# Patient Record
Sex: Female | Born: 1940 | Race: White | Hispanic: No | Smoking: Never smoker
Health system: Southern US, Community
[De-identification: ages and names within clinical notes are randomized; demographics above are authoritative.]

## PROBLEM LIST (undated history)

## (undated) ENCOUNTER — Emergency Department: Admission: EM | Payer: Medicare Other

## (undated) ENCOUNTER — Emergency Department: Admission: EM | Payer: Medicare Other | Source: Home / Self Care

## (undated) DIAGNOSIS — I509 Heart failure, unspecified: Secondary | ICD-10-CM

## (undated) DIAGNOSIS — E7800 Pure hypercholesterolemia, unspecified: Secondary | ICD-10-CM

## (undated) DIAGNOSIS — F32A Depression, unspecified: Secondary | ICD-10-CM

## (undated) DIAGNOSIS — E119 Type 2 diabetes mellitus without complications: Secondary | ICD-10-CM

## (undated) DIAGNOSIS — J439 Emphysema, unspecified: Secondary | ICD-10-CM

## (undated) DIAGNOSIS — I2699 Other pulmonary embolism without acute cor pulmonale: Secondary | ICD-10-CM

## (undated) DIAGNOSIS — I219 Acute myocardial infarction, unspecified: Secondary | ICD-10-CM

## (undated) DIAGNOSIS — C50919 Malignant neoplasm of unspecified site of unspecified female breast: Secondary | ICD-10-CM

## (undated) DIAGNOSIS — G4730 Sleep apnea, unspecified: Secondary | ICD-10-CM

## (undated) DIAGNOSIS — Z9221 Personal history of antineoplastic chemotherapy: Secondary | ICD-10-CM

## (undated) DIAGNOSIS — F419 Anxiety disorder, unspecified: Secondary | ICD-10-CM

## (undated) DIAGNOSIS — I10 Essential (primary) hypertension: Secondary | ICD-10-CM

## (undated) DIAGNOSIS — K219 Gastro-esophageal reflux disease without esophagitis: Secondary | ICD-10-CM

## (undated) DIAGNOSIS — E079 Disorder of thyroid, unspecified: Secondary | ICD-10-CM

## (undated) DIAGNOSIS — D649 Anemia, unspecified: Secondary | ICD-10-CM

## (undated) DIAGNOSIS — I4891 Unspecified atrial fibrillation: Secondary | ICD-10-CM

## (undated) DIAGNOSIS — D689 Coagulation defect, unspecified: Secondary | ICD-10-CM

## (undated) DIAGNOSIS — M1990 Unspecified osteoarthritis, unspecified site: Secondary | ICD-10-CM

## (undated) DIAGNOSIS — R609 Edema, unspecified: Secondary | ICD-10-CM

## (undated) DIAGNOSIS — H9190 Unspecified hearing loss, unspecified ear: Secondary | ICD-10-CM

## (undated) DIAGNOSIS — K259 Gastric ulcer, unspecified as acute or chronic, without hemorrhage or perforation: Secondary | ICD-10-CM

## (undated) HISTORY — DX: Coagulation defect, unspecified: D68.9

## (undated) HISTORY — DX: Pure hypercholesterolemia, unspecified: E78.00

## (undated) HISTORY — DX: Sleep apnea, unspecified: G47.30

## (undated) HISTORY — DX: Essential (primary) hypertension: I10

## (undated) HISTORY — DX: Gastro-esophageal reflux disease without esophagitis: K21.9

## (undated) HISTORY — DX: Anemia, unspecified: D64.9

## (undated) HISTORY — DX: Other pulmonary embolism without acute cor pulmonale: I26.99

## (undated) HISTORY — DX: Type 2 diabetes mellitus without complications: E11.9

## (undated) HISTORY — DX: Anxiety disorder, unspecified: F41.9

## (undated) HISTORY — DX: Gastric ulcer, unspecified as acute or chronic, without hemorrhage or perforation: K25.9

## (undated) HISTORY — DX: Disorder of thyroid, unspecified: E07.9

## (undated) HISTORY — DX: Heart failure, unspecified: I50.9

## (undated) HISTORY — DX: Acute myocardial infarction, unspecified: I21.9

## (undated) HISTORY — DX: Unspecified atrial fibrillation: I48.91

## (undated) HISTORY — DX: Malignant neoplasm of unspecified site of unspecified female breast: C50.919

## (undated) HISTORY — DX: Emphysema, unspecified: J43.9

## (undated) HISTORY — DX: Depression, unspecified: F32.A

---

## 1990-03-06 HISTORY — PX: BREAST LUMPECTOMY: SHX2

## 2004-01-20 ENCOUNTER — Ambulatory Visit: Payer: Self-pay

## 2004-01-25 ENCOUNTER — Ambulatory Visit: Payer: Self-pay

## 2004-08-29 ENCOUNTER — Ambulatory Visit: Payer: Self-pay | Admitting: Internal Medicine

## 2005-05-22 ENCOUNTER — Ambulatory Visit: Payer: Self-pay | Admitting: Internal Medicine

## 2005-06-04 ENCOUNTER — Ambulatory Visit: Payer: Self-pay | Admitting: Internal Medicine

## 2005-07-04 ENCOUNTER — Ambulatory Visit: Payer: Self-pay | Admitting: Internal Medicine

## 2005-08-31 ENCOUNTER — Ambulatory Visit: Payer: Self-pay | Admitting: Gastroenterology

## 2005-09-13 ENCOUNTER — Ambulatory Visit: Payer: Self-pay | Admitting: Internal Medicine

## 2006-07-12 ENCOUNTER — Emergency Department: Payer: Self-pay | Admitting: Unknown Physician Specialty

## 2006-09-24 ENCOUNTER — Ambulatory Visit: Payer: Self-pay | Admitting: Internal Medicine

## 2007-01-29 ENCOUNTER — Other Ambulatory Visit: Payer: Self-pay

## 2007-01-29 ENCOUNTER — Inpatient Hospital Stay: Payer: Self-pay | Admitting: Internal Medicine

## 2007-01-29 ENCOUNTER — Ambulatory Visit: Payer: Self-pay | Admitting: Internal Medicine

## 2007-02-04 ENCOUNTER — Ambulatory Visit: Payer: Self-pay | Admitting: Oncology

## 2007-02-12 ENCOUNTER — Ambulatory Visit: Payer: Self-pay | Admitting: Oncology

## 2007-03-07 ENCOUNTER — Ambulatory Visit: Payer: Self-pay | Admitting: Oncology

## 2007-04-07 ENCOUNTER — Ambulatory Visit: Payer: Self-pay | Admitting: Oncology

## 2007-04-22 ENCOUNTER — Ambulatory Visit: Payer: Self-pay | Admitting: Oncology

## 2007-05-05 ENCOUNTER — Ambulatory Visit: Payer: Self-pay | Admitting: Oncology

## 2007-06-05 ENCOUNTER — Ambulatory Visit: Payer: Self-pay | Admitting: Oncology

## 2007-06-07 ENCOUNTER — Inpatient Hospital Stay: Payer: Self-pay | Admitting: Internal Medicine

## 2007-06-17 ENCOUNTER — Ambulatory Visit: Payer: Self-pay | Admitting: Oncology

## 2007-07-05 ENCOUNTER — Ambulatory Visit: Payer: Self-pay | Admitting: Oncology

## 2007-08-05 ENCOUNTER — Ambulatory Visit: Payer: Self-pay | Admitting: Oncology

## 2007-08-05 ENCOUNTER — Ambulatory Visit: Payer: Self-pay | Admitting: Gastroenterology

## 2007-09-26 ENCOUNTER — Ambulatory Visit: Payer: Self-pay | Admitting: Internal Medicine

## 2008-02-11 ENCOUNTER — Ambulatory Visit: Payer: Self-pay | Admitting: Gastroenterology

## 2008-03-16 LAB — HM COLONOSCOPY

## 2008-04-29 ENCOUNTER — Ambulatory Visit: Payer: Self-pay | Admitting: Internal Medicine

## 2008-09-29 ENCOUNTER — Ambulatory Visit: Payer: Self-pay | Admitting: Internal Medicine

## 2008-11-04 ENCOUNTER — Ambulatory Visit: Payer: Self-pay | Admitting: Oncology

## 2008-11-16 ENCOUNTER — Ambulatory Visit: Payer: Self-pay | Admitting: Family Medicine

## 2008-11-23 ENCOUNTER — Ambulatory Visit: Payer: Self-pay | Admitting: Unknown Physician Specialty

## 2008-11-24 ENCOUNTER — Ambulatory Visit: Payer: Self-pay | Admitting: Unknown Physician Specialty

## 2008-11-25 ENCOUNTER — Ambulatory Visit: Payer: Self-pay | Admitting: Internal Medicine

## 2008-12-03 ENCOUNTER — Ambulatory Visit: Payer: Self-pay | Admitting: Oncology

## 2008-12-04 ENCOUNTER — Ambulatory Visit: Payer: Self-pay | Admitting: Oncology

## 2008-12-06 ENCOUNTER — Ambulatory Visit: Payer: Self-pay

## 2009-01-04 ENCOUNTER — Ambulatory Visit: Payer: Self-pay | Admitting: Oncology

## 2009-01-31 ENCOUNTER — Inpatient Hospital Stay: Payer: Self-pay | Admitting: Internal Medicine

## 2009-02-03 ENCOUNTER — Ambulatory Visit: Payer: Self-pay | Admitting: Oncology

## 2009-02-22 ENCOUNTER — Inpatient Hospital Stay: Payer: Self-pay | Admitting: Internal Medicine

## 2009-02-25 ENCOUNTER — Ambulatory Visit: Payer: Self-pay | Admitting: Oncology

## 2009-03-06 ENCOUNTER — Ambulatory Visit: Payer: Self-pay | Admitting: Oncology

## 2009-05-04 ENCOUNTER — Ambulatory Visit: Payer: Self-pay | Admitting: Oncology

## 2009-06-01 ENCOUNTER — Ambulatory Visit: Payer: Self-pay | Admitting: Oncology

## 2009-06-04 ENCOUNTER — Ambulatory Visit: Payer: Self-pay | Admitting: Oncology

## 2009-08-03 ENCOUNTER — Ambulatory Visit: Payer: Self-pay | Admitting: Internal Medicine

## 2009-10-08 ENCOUNTER — Ambulatory Visit: Payer: Self-pay | Admitting: Internal Medicine

## 2009-11-19 ENCOUNTER — Ambulatory Visit: Payer: Self-pay | Admitting: Vascular Surgery

## 2010-01-12 ENCOUNTER — Ambulatory Visit: Payer: Self-pay | Admitting: Internal Medicine

## 2010-05-18 ENCOUNTER — Ambulatory Visit: Payer: Self-pay | Admitting: Dermatology

## 2010-06-19 ENCOUNTER — Ambulatory Visit: Payer: Self-pay | Admitting: Family Medicine

## 2010-08-24 ENCOUNTER — Ambulatory Visit: Payer: Self-pay | Admitting: Family Medicine

## 2010-10-11 ENCOUNTER — Ambulatory Visit: Payer: Self-pay | Admitting: Internal Medicine

## 2010-10-12 LAB — HM DIABETES EYE EXAM

## 2011-03-29 DIAGNOSIS — E119 Type 2 diabetes mellitus without complications: Secondary | ICD-10-CM | POA: Diagnosis not present

## 2011-03-29 DIAGNOSIS — I10 Essential (primary) hypertension: Secondary | ICD-10-CM | POA: Diagnosis not present

## 2011-03-29 DIAGNOSIS — K219 Gastro-esophageal reflux disease without esophagitis: Secondary | ICD-10-CM | POA: Diagnosis not present

## 2011-04-05 DIAGNOSIS — R5381 Other malaise: Secondary | ICD-10-CM | POA: Diagnosis not present

## 2011-04-05 DIAGNOSIS — I10 Essential (primary) hypertension: Secondary | ICD-10-CM | POA: Diagnosis not present

## 2011-04-05 DIAGNOSIS — R5383 Other fatigue: Secondary | ICD-10-CM | POA: Diagnosis not present

## 2011-04-05 DIAGNOSIS — E7800 Pure hypercholesterolemia, unspecified: Secondary | ICD-10-CM | POA: Diagnosis not present

## 2011-04-05 DIAGNOSIS — E119 Type 2 diabetes mellitus without complications: Secondary | ICD-10-CM | POA: Diagnosis not present

## 2011-04-05 DIAGNOSIS — I2699 Other pulmonary embolism without acute cor pulmonale: Secondary | ICD-10-CM | POA: Diagnosis not present

## 2011-04-10 DIAGNOSIS — Z1211 Encounter for screening for malignant neoplasm of colon: Secondary | ICD-10-CM | POA: Diagnosis not present

## 2011-05-03 DIAGNOSIS — I2699 Other pulmonary embolism without acute cor pulmonale: Secondary | ICD-10-CM | POA: Diagnosis not present

## 2011-05-09 DIAGNOSIS — I87099 Postthrombotic syndrome with other complications of unspecified lower extremity: Secondary | ICD-10-CM | POA: Diagnosis not present

## 2011-05-09 DIAGNOSIS — M79609 Pain in unspecified limb: Secondary | ICD-10-CM | POA: Diagnosis not present

## 2011-05-17 DIAGNOSIS — B351 Tinea unguium: Secondary | ICD-10-CM | POA: Diagnosis not present

## 2011-05-17 DIAGNOSIS — E119 Type 2 diabetes mellitus without complications: Secondary | ICD-10-CM | POA: Diagnosis not present

## 2011-05-31 DIAGNOSIS — I2699 Other pulmonary embolism without acute cor pulmonale: Secondary | ICD-10-CM | POA: Diagnosis not present

## 2011-06-27 DIAGNOSIS — I2699 Other pulmonary embolism without acute cor pulmonale: Secondary | ICD-10-CM | POA: Diagnosis not present

## 2011-07-24 DIAGNOSIS — D649 Anemia, unspecified: Secondary | ICD-10-CM | POA: Diagnosis not present

## 2011-07-24 DIAGNOSIS — I2699 Other pulmonary embolism without acute cor pulmonale: Secondary | ICD-10-CM | POA: Diagnosis not present

## 2011-07-24 DIAGNOSIS — E7800 Pure hypercholesterolemia, unspecified: Secondary | ICD-10-CM | POA: Diagnosis not present

## 2011-07-24 DIAGNOSIS — I10 Essential (primary) hypertension: Secondary | ICD-10-CM | POA: Diagnosis not present

## 2011-07-24 DIAGNOSIS — E119 Type 2 diabetes mellitus without complications: Secondary | ICD-10-CM | POA: Diagnosis not present

## 2011-07-24 DIAGNOSIS — Z79899 Other long term (current) drug therapy: Secondary | ICD-10-CM | POA: Diagnosis not present

## 2011-07-26 DIAGNOSIS — E119 Type 2 diabetes mellitus without complications: Secondary | ICD-10-CM | POA: Diagnosis not present

## 2011-07-26 DIAGNOSIS — E7800 Pure hypercholesterolemia, unspecified: Secondary | ICD-10-CM | POA: Diagnosis not present

## 2011-07-26 DIAGNOSIS — I10 Essential (primary) hypertension: Secondary | ICD-10-CM | POA: Diagnosis not present

## 2011-08-23 DIAGNOSIS — I2699 Other pulmonary embolism without acute cor pulmonale: Secondary | ICD-10-CM | POA: Diagnosis not present

## 2011-08-23 DIAGNOSIS — B351 Tinea unguium: Secondary | ICD-10-CM | POA: Diagnosis not present

## 2011-08-23 DIAGNOSIS — E119 Type 2 diabetes mellitus without complications: Secondary | ICD-10-CM | POA: Diagnosis not present

## 2011-09-13 DIAGNOSIS — I2699 Other pulmonary embolism without acute cor pulmonale: Secondary | ICD-10-CM | POA: Diagnosis not present

## 2011-10-11 DIAGNOSIS — I2699 Other pulmonary embolism without acute cor pulmonale: Secondary | ICD-10-CM | POA: Diagnosis not present

## 2011-10-18 ENCOUNTER — Ambulatory Visit: Payer: Self-pay | Admitting: Internal Medicine

## 2011-10-18 DIAGNOSIS — Z853 Personal history of malignant neoplasm of breast: Secondary | ICD-10-CM | POA: Diagnosis not present

## 2011-10-18 DIAGNOSIS — N6489 Other specified disorders of breast: Secondary | ICD-10-CM | POA: Diagnosis not present

## 2011-10-25 DIAGNOSIS — I2699 Other pulmonary embolism without acute cor pulmonale: Secondary | ICD-10-CM | POA: Diagnosis not present

## 2011-11-02 DIAGNOSIS — I2699 Other pulmonary embolism without acute cor pulmonale: Secondary | ICD-10-CM | POA: Diagnosis not present

## 2011-11-08 DIAGNOSIS — I2699 Other pulmonary embolism without acute cor pulmonale: Secondary | ICD-10-CM | POA: Diagnosis not present

## 2011-11-15 DIAGNOSIS — B351 Tinea unguium: Secondary | ICD-10-CM | POA: Diagnosis not present

## 2011-11-15 DIAGNOSIS — E119 Type 2 diabetes mellitus without complications: Secondary | ICD-10-CM | POA: Diagnosis not present

## 2011-11-15 DIAGNOSIS — L851 Acquired keratosis [keratoderma] palmaris et plantaris: Secondary | ICD-10-CM | POA: Diagnosis not present

## 2011-11-15 DIAGNOSIS — I2699 Other pulmonary embolism without acute cor pulmonale: Secondary | ICD-10-CM | POA: Diagnosis not present

## 2011-11-21 DIAGNOSIS — I2699 Other pulmonary embolism without acute cor pulmonale: Secondary | ICD-10-CM | POA: Diagnosis not present

## 2011-11-29 DIAGNOSIS — I2699 Other pulmonary embolism without acute cor pulmonale: Secondary | ICD-10-CM | POA: Diagnosis not present

## 2011-12-08 ENCOUNTER — Other Ambulatory Visit (INDEPENDENT_AMBULATORY_CARE_PROVIDER_SITE_OTHER): Payer: Medicare Other

## 2011-12-08 DIAGNOSIS — Z79899 Other long term (current) drug therapy: Secondary | ICD-10-CM | POA: Diagnosis not present

## 2011-12-08 DIAGNOSIS — I2699 Other pulmonary embolism without acute cor pulmonale: Secondary | ICD-10-CM

## 2011-12-08 LAB — PROTIME-INR
INR: 2.6 ratio — ABNORMAL HIGH (ref 0.8–1.0)
Prothrombin Time: 27 s — ABNORMAL HIGH (ref 10.2–12.4)

## 2011-12-12 DIAGNOSIS — Z23 Encounter for immunization: Secondary | ICD-10-CM | POA: Diagnosis not present

## 2011-12-19 ENCOUNTER — Other Ambulatory Visit: Payer: Self-pay

## 2011-12-19 DIAGNOSIS — Z79899 Other long term (current) drug therapy: Secondary | ICD-10-CM

## 2011-12-19 DIAGNOSIS — I2699 Other pulmonary embolism without acute cor pulmonale: Secondary | ICD-10-CM

## 2011-12-21 ENCOUNTER — Other Ambulatory Visit (INDEPENDENT_AMBULATORY_CARE_PROVIDER_SITE_OTHER): Payer: Medicare Other

## 2011-12-21 DIAGNOSIS — Z79899 Other long term (current) drug therapy: Secondary | ICD-10-CM

## 2011-12-21 DIAGNOSIS — I2699 Other pulmonary embolism without acute cor pulmonale: Secondary | ICD-10-CM | POA: Diagnosis not present

## 2011-12-21 LAB — PROTIME-INR: Prothrombin Time: 28.8 s — ABNORMAL HIGH (ref 10.2–12.4)

## 2011-12-22 ENCOUNTER — Other Ambulatory Visit: Payer: Self-pay | Admitting: Internal Medicine

## 2011-12-22 DIAGNOSIS — I4891 Unspecified atrial fibrillation: Secondary | ICD-10-CM

## 2011-12-26 DIAGNOSIS — K219 Gastro-esophageal reflux disease without esophagitis: Secondary | ICD-10-CM | POA: Diagnosis not present

## 2012-01-01 ENCOUNTER — Ambulatory Visit (INDEPENDENT_AMBULATORY_CARE_PROVIDER_SITE_OTHER): Payer: Medicare Other | Admitting: Internal Medicine

## 2012-01-01 ENCOUNTER — Encounter: Payer: Self-pay | Admitting: Internal Medicine

## 2012-01-01 VITALS — BP 125/77 | HR 95 | Temp 97.5°F | Ht 62.0 in | Wt 240.5 lb

## 2012-01-01 DIAGNOSIS — Z7901 Long term (current) use of anticoagulants: Secondary | ICD-10-CM | POA: Diagnosis not present

## 2012-01-01 DIAGNOSIS — M549 Dorsalgia, unspecified: Secondary | ICD-10-CM | POA: Diagnosis not present

## 2012-01-01 DIAGNOSIS — I10 Essential (primary) hypertension: Secondary | ICD-10-CM

## 2012-01-01 DIAGNOSIS — C50919 Malignant neoplasm of unspecified site of unspecified female breast: Secondary | ICD-10-CM

## 2012-01-01 DIAGNOSIS — Z853 Personal history of malignant neoplasm of breast: Secondary | ICD-10-CM | POA: Insufficient documentation

## 2012-01-01 DIAGNOSIS — E119 Type 2 diabetes mellitus without complications: Secondary | ICD-10-CM | POA: Insufficient documentation

## 2012-01-01 DIAGNOSIS — I2699 Other pulmonary embolism without acute cor pulmonale: Secondary | ICD-10-CM | POA: Diagnosis not present

## 2012-01-01 DIAGNOSIS — E7800 Pure hypercholesterolemia, unspecified: Secondary | ICD-10-CM

## 2012-01-01 DIAGNOSIS — E1149 Type 2 diabetes mellitus with other diabetic neurological complication: Secondary | ICD-10-CM | POA: Insufficient documentation

## 2012-01-01 DIAGNOSIS — M25559 Pain in unspecified hip: Secondary | ICD-10-CM

## 2012-01-01 NOTE — Assessment & Plan Note (Signed)
S/P left breast mastectomy.  Schedule mammogram when due.

## 2012-01-01 NOTE — Assessment & Plan Note (Signed)
Low cholesterol diet and exercise.  Continue same meds. Check lipid panel and liver function.

## 2012-01-01 NOTE — Assessment & Plan Note (Signed)
Blood pressure under good control.  Same meds.  Check met b.

## 2012-01-01 NOTE — Progress Notes (Signed)
  Subjective:    [name], female    DOB: 29-Jun-1940, 71 y.o.   MRN: [id number]  HPI 71 year old female with past history of hypertension, diabetes, hypercholesterolemia and breast cancer s/p left breast mastectomy who comes in today for a scheduled follow up.  She states she has been doing well.  Her main complaint is that of left hip and buttock pain.  No pain radiating down the leg.  No numbness or tingling.  Pain present for the last month.  No known injury or trauma.    Past Medical History  Diagnosis Date  . Hypertension   . Diabetes mellitus   . Breast cancer     s/p left breast mastectomy  . Hypercholesterolemia     Review of Systems Patient denies any headache, lightheadedness or dizziness.  No sinus or allergy problems.  No chest pain, tightness or palpitations.  No increased shortness of breath, cough or congestion.  No nausea or vomiting.  No abdominal pain or cramping.  No bowel change, such as diarrhea, constipation, BRBPR or melana.  No urine change.  Got her flu shot.        Objective:   Physical Exam Filed Vitals:   01/01/12 1430  BP: 125/77  Pulse: 95  Temp: 97.5 F (70.9 C)   71 year old female in no acute distress.   HEENT:  Nares - clear.  OP- without lesions or erythema.  NECK:  Supple, nontender.  No audible carotid bruit.   HEART:  Appears to be regular. LUNGS:  Without crackles or wheezing audible.  Respirations even and unlabored.   RADIAL PULSE:  Equal bilaterally.  ABDOMEN:  Soft, nontender.  No audible abdominal bruit.   EXTREMITIES:  No increased edema to be present.              MSK:  No pain wit straight leg raise.  No pain to palpation over the left hip.  No pain with rotation of the hip.        Assessment & Plan:  MSK.  Left hip and buttock pain.  Exam as outlined.  Taking Tylenol.  Helps.  Check L-S spine xray and left hip xray.    HEALTH MAINTENANCE.  Schedule for a physical at next visit.  Discuss further health maintenance  issues with her then.

## 2012-01-01 NOTE — Assessment & Plan Note (Signed)
Sugars have been well controlled.  On no meds.  Follow.

## 2012-01-01 NOTE — Patient Instructions (Signed)
It was good seeing you today.  We will let you know about your coumadin blood results - once they are available.  We will get your physical scheduled.

## 2012-01-02 ENCOUNTER — Ambulatory Visit (INDEPENDENT_AMBULATORY_CARE_PROVIDER_SITE_OTHER)
Admission: RE | Admit: 2012-01-02 | Discharge: 2012-01-02 | Disposition: A | Discharge status: Home / Self Care | Payer: Medicare Other | Source: Ambulatory Visit | Attending: Internal Medicine | Admitting: Internal Medicine

## 2012-01-02 ENCOUNTER — Encounter: Payer: Self-pay | Admitting: Internal Medicine

## 2012-01-02 ENCOUNTER — Other Ambulatory Visit: Payer: Self-pay | Admitting: Internal Medicine

## 2012-01-02 DIAGNOSIS — M5137 Other intervertebral disc degeneration, lumbosacral region: Secondary | ICD-10-CM | POA: Diagnosis not present

## 2012-01-02 DIAGNOSIS — M1610 Unilateral primary osteoarthritis, unspecified hip: Secondary | ICD-10-CM | POA: Diagnosis not present

## 2012-01-02 DIAGNOSIS — M25559 Pain in unspecified hip: Secondary | ICD-10-CM

## 2012-01-02 DIAGNOSIS — M549 Dorsalgia, unspecified: Secondary | ICD-10-CM | POA: Diagnosis not present

## 2012-01-02 DIAGNOSIS — I2699 Other pulmonary embolism without acute cor pulmonale: Secondary | ICD-10-CM

## 2012-01-02 DIAGNOSIS — M169 Osteoarthritis of hip, unspecified: Secondary | ICD-10-CM | POA: Diagnosis not present

## 2012-01-02 LAB — PROTIME-INR: Prothrombin Time: 32.8 s — ABNORMAL HIGH (ref 10.2–12.4)

## 2012-01-16 ENCOUNTER — Other Ambulatory Visit (INDEPENDENT_AMBULATORY_CARE_PROVIDER_SITE_OTHER): Payer: Medicare Other

## 2012-01-16 DIAGNOSIS — I4891 Unspecified atrial fibrillation: Secondary | ICD-10-CM | POA: Diagnosis not present

## 2012-01-16 DIAGNOSIS — M25559 Pain in unspecified hip: Secondary | ICD-10-CM | POA: Diagnosis not present

## 2012-01-16 DIAGNOSIS — M549 Dorsalgia, unspecified: Secondary | ICD-10-CM | POA: Diagnosis not present

## 2012-01-17 ENCOUNTER — Other Ambulatory Visit: Payer: Self-pay | Admitting: Internal Medicine

## 2012-01-17 DIAGNOSIS — I2699 Other pulmonary embolism without acute cor pulmonale: Secondary | ICD-10-CM

## 2012-01-17 LAB — PROTIME-INR: Prothrombin Time: 20.1 s — ABNORMAL HIGH (ref 10.2–12.4)

## 2012-01-18 DIAGNOSIS — M25559 Pain in unspecified hip: Secondary | ICD-10-CM | POA: Diagnosis not present

## 2012-01-18 DIAGNOSIS — M549 Dorsalgia, unspecified: Secondary | ICD-10-CM | POA: Diagnosis not present

## 2012-01-19 NOTE — Progress Notes (Signed)
Patient notified

## 2012-01-19 NOTE — Progress Notes (Signed)
Called and gave lab results to patient. Will call back appt time, ok to leave message on machine. 11/22 10:30. Patient notified.

## 2012-01-23 DIAGNOSIS — M549 Dorsalgia, unspecified: Secondary | ICD-10-CM | POA: Diagnosis not present

## 2012-01-23 DIAGNOSIS — M25559 Pain in unspecified hip: Secondary | ICD-10-CM | POA: Diagnosis not present

## 2012-01-25 DIAGNOSIS — M549 Dorsalgia, unspecified: Secondary | ICD-10-CM | POA: Diagnosis not present

## 2012-01-25 DIAGNOSIS — M25559 Pain in unspecified hip: Secondary | ICD-10-CM | POA: Diagnosis not present

## 2012-01-26 ENCOUNTER — Other Ambulatory Visit (INDEPENDENT_AMBULATORY_CARE_PROVIDER_SITE_OTHER): Payer: Medicare Other

## 2012-01-26 DIAGNOSIS — I2699 Other pulmonary embolism without acute cor pulmonale: Secondary | ICD-10-CM | POA: Diagnosis not present

## 2012-01-26 LAB — PROTIME-INR: INR: 2.3 ratio — ABNORMAL HIGH (ref 0.8–1.0)

## 2012-01-27 ENCOUNTER — Other Ambulatory Visit: Payer: Self-pay | Admitting: Internal Medicine

## 2012-01-27 DIAGNOSIS — I2699 Other pulmonary embolism without acute cor pulmonale: Secondary | ICD-10-CM

## 2012-01-30 DIAGNOSIS — M25559 Pain in unspecified hip: Secondary | ICD-10-CM | POA: Diagnosis not present

## 2012-01-30 DIAGNOSIS — M549 Dorsalgia, unspecified: Secondary | ICD-10-CM | POA: Diagnosis not present

## 2012-02-02 DIAGNOSIS — J069 Acute upper respiratory infection, unspecified: Secondary | ICD-10-CM | POA: Diagnosis not present

## 2012-02-04 DIAGNOSIS — M549 Dorsalgia, unspecified: Secondary | ICD-10-CM | POA: Diagnosis not present

## 2012-02-04 DIAGNOSIS — M25559 Pain in unspecified hip: Secondary | ICD-10-CM | POA: Diagnosis not present

## 2012-02-07 DIAGNOSIS — B351 Tinea unguium: Secondary | ICD-10-CM | POA: Diagnosis not present

## 2012-02-07 DIAGNOSIS — E119 Type 2 diabetes mellitus without complications: Secondary | ICD-10-CM | POA: Diagnosis not present

## 2012-02-21 ENCOUNTER — Other Ambulatory Visit (INDEPENDENT_AMBULATORY_CARE_PROVIDER_SITE_OTHER): Payer: Medicare Other

## 2012-02-21 ENCOUNTER — Other Ambulatory Visit: Payer: Self-pay | Admitting: Internal Medicine

## 2012-02-21 DIAGNOSIS — I2699 Other pulmonary embolism without acute cor pulmonale: Secondary | ICD-10-CM

## 2012-02-21 NOTE — Telephone Encounter (Signed)
Warfarin Sodium 2.5 mg tab  Take 2 tablets on Tues, Thurs, Sat, and Sun and take 1 tablet on all other days  # 100

## 2012-02-21 NOTE — Telephone Encounter (Signed)
Pt is needing refill on Warfarin she uses [facility] 3 month supply.

## 2012-02-22 ENCOUNTER — Other Ambulatory Visit: Payer: Self-pay | Admitting: Internal Medicine

## 2012-02-22 DIAGNOSIS — I2699 Other pulmonary embolism without acute cor pulmonale: Secondary | ICD-10-CM

## 2012-02-22 DIAGNOSIS — D649 Anemia, unspecified: Secondary | ICD-10-CM

## 2012-02-22 DIAGNOSIS — R5383 Other fatigue: Secondary | ICD-10-CM

## 2012-02-22 DIAGNOSIS — E119 Type 2 diabetes mellitus without complications: Secondary | ICD-10-CM

## 2012-02-22 DIAGNOSIS — I10 Essential (primary) hypertension: Secondary | ICD-10-CM

## 2012-02-22 DIAGNOSIS — E7800 Pure hypercholesterolemia, unspecified: Secondary | ICD-10-CM

## 2012-02-22 NOTE — Telephone Encounter (Addendum)
Lisinopril 20 mg tab  Take one tablet by mouth every day  #90  Ferrous Sulf 5 GR tab  Take one tablet by mouth every day  #90  Simvastatin 10 mg tab  Take one tablet by mouth every day  #90

## 2012-02-22 NOTE — Progress Notes (Signed)
Orders placed for follow up labs 

## 2012-02-23 ENCOUNTER — Telehealth: Payer: Self-pay | Admitting: Internal Medicine

## 2012-02-23 NOTE — Telephone Encounter (Signed)
Medications called in to pharmacy. Patient was notified.

## 2012-02-23 NOTE — Telephone Encounter (Signed)
Refills for 90 days with 3 refills called in for coumadin, simvastatin, lisinopril, and iron

## 2012-02-23 NOTE — Telephone Encounter (Signed)
Patient would like these refilled Please advise

## 2012-02-23 NOTE — Telephone Encounter (Signed)
Pt called and all she would say is if we could get a nurse or Dr .Lorin Picket to call her back.

## 2012-02-23 NOTE — Telephone Encounter (Signed)
See other message.  rx called in for simvastatin, iron, lisinopril and coumadin (90 days with 3 refills)

## 2012-02-29 MED ORDER — WARFARIN SODIUM 2.5 MG PO TABS: ORAL_TABLET | ORAL | Status: DC

## 2012-02-29 NOTE — Telephone Encounter (Signed)
Sent in to pharmacy.  

## 2012-03-06 DIAGNOSIS — M25559 Pain in unspecified hip: Secondary | ICD-10-CM | POA: Diagnosis not present

## 2012-03-06 DIAGNOSIS — M549 Dorsalgia, unspecified: Secondary | ICD-10-CM | POA: Diagnosis not present

## 2012-03-07 DIAGNOSIS — M549 Dorsalgia, unspecified: Secondary | ICD-10-CM | POA: Diagnosis not present

## 2012-03-07 DIAGNOSIS — M25559 Pain in unspecified hip: Secondary | ICD-10-CM | POA: Diagnosis not present

## 2012-03-08 DIAGNOSIS — M549 Dorsalgia, unspecified: Secondary | ICD-10-CM | POA: Diagnosis not present

## 2012-03-08 DIAGNOSIS — M25559 Pain in unspecified hip: Secondary | ICD-10-CM | POA: Diagnosis not present

## 2012-03-12 DIAGNOSIS — M549 Dorsalgia, unspecified: Secondary | ICD-10-CM | POA: Diagnosis not present

## 2012-03-12 DIAGNOSIS — M25559 Pain in unspecified hip: Secondary | ICD-10-CM | POA: Diagnosis not present

## 2012-03-14 DIAGNOSIS — M549 Dorsalgia, unspecified: Secondary | ICD-10-CM | POA: Diagnosis not present

## 2012-03-14 DIAGNOSIS — M25559 Pain in unspecified hip: Secondary | ICD-10-CM | POA: Diagnosis not present

## 2012-03-18 DIAGNOSIS — M25559 Pain in unspecified hip: Secondary | ICD-10-CM | POA: Diagnosis not present

## 2012-03-18 DIAGNOSIS — M549 Dorsalgia, unspecified: Secondary | ICD-10-CM | POA: Diagnosis not present

## 2012-03-20 ENCOUNTER — Other Ambulatory Visit (INDEPENDENT_AMBULATORY_CARE_PROVIDER_SITE_OTHER): Payer: Medicare Other

## 2012-03-20 ENCOUNTER — Other Ambulatory Visit: Payer: Self-pay | Admitting: Internal Medicine

## 2012-03-20 DIAGNOSIS — D649 Anemia, unspecified: Secondary | ICD-10-CM | POA: Diagnosis not present

## 2012-03-20 DIAGNOSIS — I2699 Other pulmonary embolism without acute cor pulmonale: Secondary | ICD-10-CM

## 2012-03-20 DIAGNOSIS — R5381 Other malaise: Secondary | ICD-10-CM | POA: Diagnosis not present

## 2012-03-20 DIAGNOSIS — E7800 Pure hypercholesterolemia, unspecified: Secondary | ICD-10-CM | POA: Diagnosis not present

## 2012-03-20 DIAGNOSIS — R5383 Other fatigue: Secondary | ICD-10-CM

## 2012-03-20 DIAGNOSIS — E119 Type 2 diabetes mellitus without complications: Secondary | ICD-10-CM

## 2012-03-20 DIAGNOSIS — Z7901 Long term (current) use of anticoagulants: Secondary | ICD-10-CM | POA: Diagnosis not present

## 2012-03-20 DIAGNOSIS — I10 Essential (primary) hypertension: Secondary | ICD-10-CM | POA: Diagnosis not present

## 2012-03-20 LAB — CBC WITH DIFFERENTIAL/PLATELET
Eosinophils Absolute: 0.1 10*3/uL (ref 0.0–0.7)
MCHC: 33.5 g/dL (ref 30.0–36.0)
MCV: 90.4 fl (ref 78.0–100.0)
Monocytes Absolute: 0.8 10*3/uL (ref 0.1–1.0)
Neutrophils Relative %: 51.1 % (ref 43.0–77.0)
Platelets: 194 10*3/uL (ref 150.0–400.0)

## 2012-03-20 LAB — BASIC METABOLIC PANEL
Calcium: 9.4 mg/dL (ref 8.4–10.5)
GFR: 52.71 mL/min — ABNORMAL LOW (ref >60.00)
Glucose, Bld: 68 mg/dL — ABNORMAL LOW (ref 70–99)
Sodium: 138 mEq/L (ref 135–145)

## 2012-03-20 LAB — HEPATIC FUNCTION PANEL
AST: 24 U/L (ref 0–37)
Albumin: 4 g/dL (ref 3.5–5.2)
Alkaline Phosphatase: 42 U/L (ref 39–117)
Bilirubin, Direct: 0 mg/dL (ref 0.0–0.3)

## 2012-03-20 LAB — LIPID PANEL: HDL: 51.6 mg/dL (ref >39.00)

## 2012-03-20 LAB — FERRITIN: Ferritin: 150.2 ng/mL (ref 10.0–291.0)

## 2012-03-20 LAB — TSH: TSH: 2.36 u[IU]/mL (ref 0.35–5.50)

## 2012-03-20 NOTE — Progress Notes (Signed)
Order placed for follow up pt/inr.  

## 2012-04-05 ENCOUNTER — Ambulatory Visit (INDEPENDENT_AMBULATORY_CARE_PROVIDER_SITE_OTHER): Payer: Medicare Other | Admitting: Internal Medicine

## 2012-04-05 ENCOUNTER — Other Ambulatory Visit (HOSPITAL_COMMUNITY)
Admission: RE | Admit: 2012-04-05 | Discharge: 2012-04-05 | Disposition: A | Discharge status: Home / Self Care | Payer: Medicare Other | Source: Ambulatory Visit | Attending: Internal Medicine | Admitting: Internal Medicine

## 2012-04-05 ENCOUNTER — Encounter: Payer: Self-pay | Admitting: Internal Medicine

## 2012-04-05 VITALS — BP 120/70 | HR 85 | Temp 97.8°F | Ht 62.0 in | Wt 242.5 lb

## 2012-04-05 DIAGNOSIS — C50919 Malignant neoplasm of unspecified site of unspecified female breast: Secondary | ICD-10-CM | POA: Diagnosis not present

## 2012-04-05 DIAGNOSIS — I10 Essential (primary) hypertension: Secondary | ICD-10-CM

## 2012-04-05 DIAGNOSIS — E7800 Pure hypercholesterolemia, unspecified: Secondary | ICD-10-CM | POA: Diagnosis not present

## 2012-04-05 DIAGNOSIS — Z139 Encounter for screening, unspecified: Secondary | ICD-10-CM

## 2012-04-05 DIAGNOSIS — E119 Type 2 diabetes mellitus without complications: Secondary | ICD-10-CM

## 2012-04-05 DIAGNOSIS — Z124 Encounter for screening for malignant neoplasm of cervix: Secondary | ICD-10-CM | POA: Insufficient documentation

## 2012-04-06 ENCOUNTER — Encounter: Payer: Self-pay | Admitting: Internal Medicine

## 2012-04-06 NOTE — Progress Notes (Signed)
  Subjective:    [name], female    DOB: 10/26/1940, 72 y.o.   MRN: [id number]  HPI 72 year old female with past history of hypertension, diabetes, hypercholesterolemia and breast cancer s/p left breast mastectomy who comes in today to follow up on these issues as well as for a complete physical exam.  She states she has been doing well.  Her hip is better.  Went to therapy.  Discussed continuing exercises at home.  No chest pain or tightness.  Breathing stable.  Bowels doing well.     Past Medical History  Diagnosis Date  . Hypertension   . Diabetes mellitus   . Breast cancer     s/p lumpectomy 1992.  s/p chemo and xrt  . Hypercholesterolemia   . Atrial fibrillation   . Anemia   . GERD (gastroesophageal reflux disease)   . Pulmonary emboli   . Gastric ulcer     Current Outpatient Prescriptions on File Prior to Visit  Medication Sig Dispense Refill  . ferrous sulfate 325 (65 FE) MG tablet Take 325 mg by mouth daily with breakfast.      . lisinopril (PRINIVIL,ZESTRIL) 20 MG tablet Take 20 mg by mouth daily.      . pantoprazole (PROTONIX) 40 MG tablet Take 40 mg by mouth daily.      . simvastatin (ZOCOR) 10 MG tablet Take 10 mg by mouth at bedtime.      Marland Kitchen warfarin (COUMADIN) 2.5 MG tablet As directed  30 tablet  5    Review of Systems Patient denies any headache, lightheadedness or dizziness.  No sinus or allergy problems.  No chest pain, tightness or palpitations.  No increased shortness of breath, cough or congestion.  No nausea or vomiting.  No abdominal pain or cramping.  No bowel change, such as diarrhea, constipation, BRBPR or melana.  No urine change.  Hip is better.       Objective:   Physical Exam  Filed Vitals:   04/05/12 0955  BP: 120/70  Pulse: 85  Temp: 97.8 F (61.74 C)   72 year old female in no acute distress.   HEENT:  Nares- clear.  Oropharynx - without lesions. NECK:  Supple.  Nontender.  No audible bruit.  HEART:  Appears to be  regular. LUNGS:  No crackles or wheezing audible.  Respirations even and unlabored.  RADIAL PULSE:  Equal bilaterally.    BREASTS:  No nipple discharge or nipple retraction present.  Could not appreciate any distinct nodules or axillary adenopathy.  ABDOMEN:  Soft, nontender.  Bowel sounds present and normal.  No audible abdominal bruit.  GU:  Normal external genitalia.  Vaginal vault without lesions.  Cervix identified.  Pap performed. Could not appreciate any adnexal masses or tenderness.   RECTAL:  Heme negative.   EXTREMITIES:  No increased edema present.  DP pulses palpable and equal bilaterally.             Assessment & Plan:  MSK.  Left hip and buttock pain - last visit.  Went to therapy.  Doing better.  Continue exercises at home.      HEALTH MAINTENANCE.  Physical today.  Schedule mammogram.   IFOB today.

## 2012-04-06 NOTE — Assessment & Plan Note (Signed)
Low cholesterol diet.  Continue simvastatin.  Check lipid panel and liver function.

## 2012-04-06 NOTE — Assessment & Plan Note (Signed)
Schedule mammogram.

## 2012-04-06 NOTE — Assessment & Plan Note (Signed)
Blood pressure - under good control.  Check metabolic panel.  Same medication regimen.

## 2012-04-06 NOTE — Assessment & Plan Note (Signed)
Low carb diet.  Sugars averaging 119-120 - am readings.  Follow.  Check metabolic panel and a1c.

## 2012-04-10 ENCOUNTER — Ambulatory Visit (INDEPENDENT_AMBULATORY_CARE_PROVIDER_SITE_OTHER): Payer: Medicare Other | Admitting: Adult Health

## 2012-04-10 ENCOUNTER — Encounter: Payer: Self-pay | Admitting: Adult Health

## 2012-04-10 VITALS — BP 132/70 | HR 75 | Temp 98.0°F | Resp 14 | Ht 62.0 in | Wt 242.5 lb

## 2012-04-10 DIAGNOSIS — R059 Cough, unspecified: Secondary | ICD-10-CM

## 2012-04-10 DIAGNOSIS — R05 Cough: Secondary | ICD-10-CM | POA: Diagnosis not present

## 2012-04-10 MED ORDER — FLUTICASONE PROPIONATE 50 MCG/ACT NA SUSP: 2.0000 | Freq: Every day | NASAL | Status: DC

## 2012-04-10 NOTE — Patient Instructions (Addendum)
  Start flonase nasal spray - 2 sprays in each nostril daily.  Take Robitussin DM or Delsym for cough  If your not better within a week then call.

## 2012-04-10 NOTE — Progress Notes (Signed)
  Subjective:    [name], female    DOB: Apr 25, 1940, 72 y.o.   MRN: [id number]  HPI  Patient presents to clinic with c/o cough that began approx. 1 week ago. Cough is dry. Denies sob, cp. She does not feel sick. She has not tried any OTC medication.   Current Outpatient Prescriptions on File Prior to Visit  Medication Sig Dispense Refill  . ferrous sulfate 325 (65 FE) MG tablet Take 325 mg by mouth daily with breakfast.      . lisinopril (PRINIVIL,ZESTRIL) 20 MG tablet Take 20 mg by mouth daily.      . pantoprazole (PROTONIX) 40 MG tablet Take 40 mg by mouth daily.      . simvastatin (ZOCOR) 10 MG tablet Take 10 mg by mouth at bedtime.      Marland Kitchen warfarin (COUMADIN) 2.5 MG tablet As directed  30 tablet  5     Review of Systems  Constitutional: Negative for fever, chills and fatigue.  HENT: Negative for congestion, sore throat, rhinorrhea and postnasal drip.   Respiratory: Positive for cough. Negative for shortness of breath.   Cardiovascular: Negative for chest pain.  Gastrointestinal: Negative.   Genitourinary: Negative.   Neurological: Negative.   Psychiatric/Behavioral: Negative.     BP 132/70  Pulse 75  Temp 98 F (36.7 C) (Oral)  Resp 14  Ht 5\' 2"  (1.575 m)  Wt 242 lb 8 oz (109.997 kg)  BMI 44.35 kg/m2  SpO2 94%     Objective:   Physical Exam  Constitutional: She is oriented to person, place, and time. She appears well-developed and well-nourished. No distress.  HENT:  Head: Normocephalic and atraumatic.  Cardiovascular: Normal rate, regular rhythm and normal heart sounds.   Pulmonary/Chest: Effort normal and breath sounds normal. She has no wheezes. She has no rales.  Abdominal: Soft. Bowel sounds are normal.  Musculoskeletal: Normal range of motion.  Neurological: She is alert and oriented to person, place, and time.  Skin: Skin is warm and dry.  Psychiatric: She has a normal mood and affect. Her behavior is normal. Judgment and thought content  normal.        Assessment & Plan:

## 2012-04-10 NOTE — Assessment & Plan Note (Signed)
No shortness of breath. Dry cough. Patient is on an ACEI; however, she has been on an ACE for ~ 5 years. If no resolution within 1 week may consider stopping ACE. Will have her try robitussin or delsym. Also ordered flonase nasal spray.

## 2012-04-11 ENCOUNTER — Other Ambulatory Visit (INDEPENDENT_AMBULATORY_CARE_PROVIDER_SITE_OTHER): Payer: Medicare Other

## 2012-04-11 DIAGNOSIS — Z139 Encounter for screening, unspecified: Secondary | ICD-10-CM | POA: Diagnosis not present

## 2012-04-13 DIAGNOSIS — J40 Bronchitis, not specified as acute or chronic: Secondary | ICD-10-CM | POA: Diagnosis not present

## 2012-04-22 ENCOUNTER — Other Ambulatory Visit (INDEPENDENT_AMBULATORY_CARE_PROVIDER_SITE_OTHER): Payer: Medicare Other

## 2012-04-22 DIAGNOSIS — Z7901 Long term (current) use of anticoagulants: Secondary | ICD-10-CM

## 2012-04-22 DIAGNOSIS — I2699 Other pulmonary embolism without acute cor pulmonale: Secondary | ICD-10-CM | POA: Diagnosis not present

## 2012-04-22 LAB — PROTIME-INR: INR: 2.1 ratio — ABNORMAL HIGH (ref 0.8–1.0)

## 2012-04-23 ENCOUNTER — Other Ambulatory Visit: Payer: Self-pay | Admitting: Internal Medicine

## 2012-04-23 DIAGNOSIS — I2699 Other pulmonary embolism without acute cor pulmonale: Secondary | ICD-10-CM

## 2012-04-23 NOTE — Progress Notes (Signed)
Order placed for follow up pt/inr.  

## 2012-05-01 DIAGNOSIS — L851 Acquired keratosis [keratoderma] palmaris et plantaris: Secondary | ICD-10-CM | POA: Diagnosis not present

## 2012-05-01 DIAGNOSIS — B351 Tinea unguium: Secondary | ICD-10-CM | POA: Diagnosis not present

## 2012-05-01 DIAGNOSIS — E119 Type 2 diabetes mellitus without complications: Secondary | ICD-10-CM | POA: Diagnosis not present

## 2012-05-24 ENCOUNTER — Other Ambulatory Visit (INDEPENDENT_AMBULATORY_CARE_PROVIDER_SITE_OTHER): Payer: Medicare Other

## 2012-05-24 ENCOUNTER — Other Ambulatory Visit: Payer: Self-pay | Admitting: Internal Medicine

## 2012-05-24 DIAGNOSIS — I2699 Other pulmonary embolism without acute cor pulmonale: Secondary | ICD-10-CM

## 2012-05-24 DIAGNOSIS — Z7901 Long term (current) use of anticoagulants: Secondary | ICD-10-CM | POA: Diagnosis not present

## 2012-05-24 LAB — PROTIME-INR: Prothrombin Time: 22.5 s — ABNORMAL HIGH (ref 10.2–12.4)

## 2012-05-24 NOTE — Progress Notes (Signed)
Order placed for follow up pt/inr.  

## 2012-06-13 NOTE — Telephone Encounter (Signed)
Please close encounter

## 2012-06-26 ENCOUNTER — Other Ambulatory Visit (INDEPENDENT_AMBULATORY_CARE_PROVIDER_SITE_OTHER): Payer: Medicare Other

## 2012-06-26 ENCOUNTER — Other Ambulatory Visit: Payer: Self-pay | Admitting: Internal Medicine

## 2012-06-26 DIAGNOSIS — I2699 Other pulmonary embolism without acute cor pulmonale: Secondary | ICD-10-CM | POA: Diagnosis not present

## 2012-06-26 NOTE — Progress Notes (Signed)
Order placed for follow up pt/inr.  

## 2012-06-28 ENCOUNTER — Other Ambulatory Visit: Payer: Medicare Other

## 2012-07-03 DIAGNOSIS — J209 Acute bronchitis, unspecified: Secondary | ICD-10-CM | POA: Diagnosis not present

## 2012-07-10 ENCOUNTER — Other Ambulatory Visit: Payer: Self-pay | Admitting: Internal Medicine

## 2012-07-10 ENCOUNTER — Other Ambulatory Visit (INDEPENDENT_AMBULATORY_CARE_PROVIDER_SITE_OTHER): Payer: Medicare Other

## 2012-07-10 DIAGNOSIS — I2699 Other pulmonary embolism without acute cor pulmonale: Secondary | ICD-10-CM

## 2012-07-10 LAB — PROTIME-INR
INR: 2.8 ratio — ABNORMAL HIGH (ref 0.8–1.0)
Prothrombin Time: 28.9 s — ABNORMAL HIGH (ref 10.2–12.4)

## 2012-07-10 NOTE — Progress Notes (Signed)
Order placed for f/u pt/inr 

## 2012-07-24 ENCOUNTER — Other Ambulatory Visit (INDEPENDENT_AMBULATORY_CARE_PROVIDER_SITE_OTHER): Payer: Medicare Other

## 2012-07-24 DIAGNOSIS — I2699 Other pulmonary embolism without acute cor pulmonale: Secondary | ICD-10-CM

## 2012-07-24 DIAGNOSIS — E119 Type 2 diabetes mellitus without complications: Secondary | ICD-10-CM | POA: Diagnosis not present

## 2012-07-24 DIAGNOSIS — B351 Tinea unguium: Secondary | ICD-10-CM | POA: Diagnosis not present

## 2012-07-24 LAB — PROTIME-INR: INR: 3.1 ratio — ABNORMAL HIGH (ref 0.8–1.0)

## 2012-07-30 ENCOUNTER — Other Ambulatory Visit: Payer: Self-pay | Admitting: *Deleted

## 2012-07-30 DIAGNOSIS — I2699 Other pulmonary embolism without acute cor pulmonale: Secondary | ICD-10-CM

## 2012-07-31 ENCOUNTER — Other Ambulatory Visit: Payer: Self-pay | Admitting: Internal Medicine

## 2012-07-31 ENCOUNTER — Other Ambulatory Visit (INDEPENDENT_AMBULATORY_CARE_PROVIDER_SITE_OTHER): Payer: Medicare Other

## 2012-07-31 DIAGNOSIS — I2699 Other pulmonary embolism without acute cor pulmonale: Secondary | ICD-10-CM | POA: Diagnosis not present

## 2012-07-31 LAB — PROTIME-INR
INR: 2.7 ratio — ABNORMAL HIGH (ref 0.8–1.0)
Prothrombin Time: 28.5 s — ABNORMAL HIGH (ref 10.2–12.4)

## 2012-07-31 NOTE — Progress Notes (Signed)
Order placed for f/u pt/inr 

## 2012-08-05 ENCOUNTER — Ambulatory Visit: Payer: Medicare Other | Admitting: Internal Medicine

## 2012-08-06 ENCOUNTER — Encounter: Payer: Self-pay | Admitting: Internal Medicine

## 2012-08-06 ENCOUNTER — Ambulatory Visit (INDEPENDENT_AMBULATORY_CARE_PROVIDER_SITE_OTHER): Payer: Medicare Other | Admitting: Internal Medicine

## 2012-08-06 ENCOUNTER — Telehealth: Payer: Self-pay | Admitting: Internal Medicine

## 2012-08-06 VITALS — BP 110/50 | HR 77 | Temp 98.4°F | Ht 62.0 in | Wt 239.8 lb

## 2012-08-06 DIAGNOSIS — C50912 Malignant neoplasm of unspecified site of left female breast: Secondary | ICD-10-CM

## 2012-08-06 DIAGNOSIS — I10 Essential (primary) hypertension: Secondary | ICD-10-CM | POA: Diagnosis not present

## 2012-08-06 DIAGNOSIS — E119 Type 2 diabetes mellitus without complications: Secondary | ICD-10-CM | POA: Diagnosis not present

## 2012-08-06 DIAGNOSIS — C50919 Malignant neoplasm of unspecified site of unspecified female breast: Secondary | ICD-10-CM

## 2012-08-06 DIAGNOSIS — R05 Cough: Secondary | ICD-10-CM

## 2012-08-06 DIAGNOSIS — E7800 Pure hypercholesterolemia, unspecified: Secondary | ICD-10-CM

## 2012-08-06 DIAGNOSIS — R059 Cough, unspecified: Secondary | ICD-10-CM

## 2012-08-06 NOTE — Telephone Encounter (Signed)
Patient aware of Mammogram appointment on 8.20.14 @ 10:00 at [place].  Needing order faxed.

## 2012-08-07 NOTE — Telephone Encounter (Signed)
I placed order for diagnostic mammogram.  Pt has a history of breast cancer

## 2012-08-09 ENCOUNTER — Encounter: Payer: Self-pay | Admitting: Internal Medicine

## 2012-08-09 NOTE — Assessment & Plan Note (Signed)
Blood pressure under good control.  Follow metabolic panel.  Same medication regimen.   

## 2012-08-09 NOTE — Assessment & Plan Note (Signed)
Not an issue for her now.  Follow.  

## 2012-08-09 NOTE — Assessment & Plan Note (Signed)
Low carb diet.  Sugars averaging 110-120. Follow.  Check metabolic panel and a1c.

## 2012-08-09 NOTE — Assessment & Plan Note (Signed)
Schedule mammogram.

## 2012-08-09 NOTE — Assessment & Plan Note (Signed)
Low cholesterol diet.  Continue simvastatin.  Check lipid panel and liver function.

## 2012-08-09 NOTE — Progress Notes (Signed)
  Subjective:    Patient ID: Jo Ann [place], female    DOB: 1941/01/01, 72 y.o.   MRN: [id number]  HPI 72 year old female with past history of hypertension, diabetes, hypercholesterolemia and breast cancer s/p left breast mastectomy who comes in today for a scheduled follow up.   She states she has been doing well.   No chest pain or tightness.  Breathing stable.  Bowels doing well.  No acid reflux.   Past Medical History  Diagnosis Date  . Hypertension   . Diabetes mellitus   . Breast cancer     s/p lumpectomy 1992.  s/p chemo and xrt  . Hypercholesterolemia   . Atrial fibrillation   . Anemia   . GERD (gastroesophageal reflux disease)   . Pulmonary emboli   . Gastric ulcer     Current Outpatient Prescriptions on File Prior to Visit  Medication Sig Dispense Refill  . ferrous sulfate 325 (65 FE) MG tablet Take 325 mg by mouth daily with breakfast.      . fluticasone (FLONASE) 50 MCG/ACT nasal spray Place 2 sprays into the nose daily.  16 g  6  . lisinopril (PRINIVIL,ZESTRIL) 20 MG tablet Take 20 mg by mouth daily.      . pantoprazole (PROTONIX) 40 MG tablet Take 40 mg by mouth daily.      . simvastatin (ZOCOR) 10 MG tablet Take 10 mg by mouth at bedtime.      Marland Kitchen warfarin (COUMADIN) 2.5 MG tablet As directed  30 tablet  5   No current facility-administered medications on file prior to visit.    Review of Systems Patient denies any headache, lightheadedness or dizziness.  No sinus or allergy problems.  No chest pain, tightness or palpitations.  No increased shortness of breath, cough or congestion.  No nausea or vomiting.  No abdominal pain or cramping.  No bowel change, such as diarrhea, constipation, BRBPR or melana.  No urine change.       Objective:   Physical Exam  Filed Vitals:   08/06/12 1524  BP: 110/50  Pulse: 77  Temp: 98.4 F (36.9 C)   Blood pressure recheck:  122/70, pulse 66  72 year old female in no acute distress.   HEENT:  Nares- clear.  Oropharynx -  without lesions. NECK:  Supple.  Nontender.  No audible bruit.  HEART:  Appears to be regular. LUNGS:  No crackles or wheezing audible.  Respirations even and unlabored.  RADIAL PULSE:  Equal bilaterally.   ABDOMEN:  Soft, nontender.  Bowel sounds present and normal.  No audible abdominal bruit.    EXTREMITIES:  No increased edema present.  DP pulses palpable and equal bilaterally.             Assessment & Plan:  MSK.  Left hip and buttock pain - noted on previous visit.  Went to therapy.  Doing better.  Continue exercises at home.      HEALTH MAINTENANCE.  Physical last visit.  Schedule mammogram.

## 2012-08-28 ENCOUNTER — Other Ambulatory Visit (INDEPENDENT_AMBULATORY_CARE_PROVIDER_SITE_OTHER): Payer: Medicare Other

## 2012-08-28 DIAGNOSIS — E7800 Pure hypercholesterolemia, unspecified: Secondary | ICD-10-CM | POA: Diagnosis not present

## 2012-08-28 DIAGNOSIS — I10 Essential (primary) hypertension: Secondary | ICD-10-CM

## 2012-08-28 DIAGNOSIS — E119 Type 2 diabetes mellitus without complications: Secondary | ICD-10-CM | POA: Diagnosis not present

## 2012-08-28 DIAGNOSIS — I2699 Other pulmonary embolism without acute cor pulmonale: Secondary | ICD-10-CM

## 2012-08-28 LAB — BASIC METABOLIC PANEL
BUN: 22 mg/dL (ref 6–23)
Chloride: 105 mEq/L (ref 96–112)
GFR: 67.57 mL/min (ref >60.00)
Potassium: 4.6 mEq/L (ref 3.5–5.1)
Sodium: 137 mEq/L (ref 135–145)

## 2012-08-28 LAB — HEPATIC FUNCTION PANEL
ALT: 20 U/L (ref 0–35)
AST: 24 U/L (ref 0–37)
Albumin: 4 g/dL (ref 3.5–5.2)
Total Protein: 7 g/dL (ref 6.0–8.3)

## 2012-08-28 LAB — LIPID PANEL
Cholesterol: 144 mg/dL (ref 0–200)
HDL: 50 mg/dL (ref >39.00)
Triglycerides: 75 mg/dL (ref 0.0–149.0)
VLDL: 15 mg/dL (ref 0.0–40.0)

## 2012-08-28 LAB — PROTIME-INR
INR: 2.3 ratio — ABNORMAL HIGH (ref 0.8–1.0)
Prothrombin Time: 23.7 s — ABNORMAL HIGH (ref 10.2–12.4)

## 2012-08-28 LAB — HEMOGLOBIN A1C: Hgb A1c MFr Bld: 6.1 % (ref 4.6–6.5)

## 2012-08-29 ENCOUNTER — Other Ambulatory Visit: Payer: Self-pay | Admitting: Internal Medicine

## 2012-08-29 DIAGNOSIS — I2699 Other pulmonary embolism without acute cor pulmonale: Secondary | ICD-10-CM

## 2012-08-29 NOTE — Progress Notes (Signed)
Order placed for f/u pt/inr 

## 2012-08-30 ENCOUNTER — Encounter: Payer: Self-pay | Admitting: *Deleted

## 2012-10-02 ENCOUNTER — Other Ambulatory Visit (INDEPENDENT_AMBULATORY_CARE_PROVIDER_SITE_OTHER): Payer: Medicare Other

## 2012-10-02 DIAGNOSIS — I2699 Other pulmonary embolism without acute cor pulmonale: Secondary | ICD-10-CM

## 2012-10-02 LAB — PROTIME-INR
INR: 2.7 ratio — ABNORMAL HIGH (ref 0.8–1.0)
Prothrombin Time: 28.3 s — ABNORMAL HIGH (ref 10.2–12.4)

## 2012-10-03 ENCOUNTER — Other Ambulatory Visit: Payer: Self-pay | Admitting: Internal Medicine

## 2012-10-03 DIAGNOSIS — I2699 Other pulmonary embolism without acute cor pulmonale: Secondary | ICD-10-CM

## 2012-10-03 NOTE — Progress Notes (Signed)
Order placed for f/u pt/inr 

## 2012-10-16 DIAGNOSIS — E119 Type 2 diabetes mellitus without complications: Secondary | ICD-10-CM | POA: Diagnosis not present

## 2012-10-16 DIAGNOSIS — B351 Tinea unguium: Secondary | ICD-10-CM | POA: Diagnosis not present

## 2012-10-16 DIAGNOSIS — L851 Acquired keratosis [keratoderma] palmaris et plantaris: Secondary | ICD-10-CM | POA: Diagnosis not present

## 2012-10-17 ENCOUNTER — Encounter: Payer: Self-pay | Admitting: *Deleted

## 2012-10-23 ENCOUNTER — Ambulatory Visit: Payer: Self-pay | Admitting: Internal Medicine

## 2012-10-23 DIAGNOSIS — Z853 Personal history of malignant neoplasm of breast: Secondary | ICD-10-CM | POA: Diagnosis not present

## 2012-10-23 DIAGNOSIS — R928 Other abnormal and inconclusive findings on diagnostic imaging of breast: Secondary | ICD-10-CM | POA: Diagnosis not present

## 2012-10-25 ENCOUNTER — Other Ambulatory Visit: Payer: Self-pay | Admitting: Internal Medicine

## 2012-10-25 ENCOUNTER — Other Ambulatory Visit (INDEPENDENT_AMBULATORY_CARE_PROVIDER_SITE_OTHER): Payer: Medicare Other

## 2012-10-25 DIAGNOSIS — I4891 Unspecified atrial fibrillation: Secondary | ICD-10-CM

## 2012-10-25 DIAGNOSIS — I2699 Other pulmonary embolism without acute cor pulmonale: Secondary | ICD-10-CM

## 2012-10-25 NOTE — Progress Notes (Signed)
Order placed for f/u pt/inr 

## 2012-10-29 ENCOUNTER — Encounter: Payer: Self-pay | Admitting: *Deleted

## 2012-11-20 DIAGNOSIS — H2510 Age-related nuclear cataract, unspecified eye: Secondary | ICD-10-CM | POA: Diagnosis not present

## 2012-11-22 ENCOUNTER — Other Ambulatory Visit (INDEPENDENT_AMBULATORY_CARE_PROVIDER_SITE_OTHER): Payer: Medicare Other

## 2012-11-22 DIAGNOSIS — I4891 Unspecified atrial fibrillation: Secondary | ICD-10-CM

## 2012-11-22 LAB — PROTIME-INR: INR: 2.2 ratio — ABNORMAL HIGH (ref 0.8–1.0)

## 2012-11-25 ENCOUNTER — Other Ambulatory Visit: Payer: Self-pay | Admitting: Internal Medicine

## 2012-11-27 DIAGNOSIS — L565 Disseminated superficial actinic porokeratosis (DSAP): Secondary | ICD-10-CM | POA: Diagnosis not present

## 2012-11-27 DIAGNOSIS — L570 Actinic keratosis: Secondary | ICD-10-CM | POA: Diagnosis not present

## 2012-12-06 ENCOUNTER — Ambulatory Visit (INDEPENDENT_AMBULATORY_CARE_PROVIDER_SITE_OTHER): Payer: Medicare Other | Admitting: Internal Medicine

## 2012-12-06 ENCOUNTER — Encounter: Payer: Self-pay | Admitting: Internal Medicine

## 2012-12-06 VITALS — BP 118/70 | HR 68 | Temp 97.9°F | Ht 62.0 in | Wt 240.5 lb

## 2012-12-06 DIAGNOSIS — E119 Type 2 diabetes mellitus without complications: Secondary | ICD-10-CM | POA: Diagnosis not present

## 2012-12-06 DIAGNOSIS — C50919 Malignant neoplasm of unspecified site of unspecified female breast: Secondary | ICD-10-CM

## 2012-12-06 DIAGNOSIS — E7800 Pure hypercholesterolemia, unspecified: Secondary | ICD-10-CM | POA: Diagnosis not present

## 2012-12-06 DIAGNOSIS — R059 Cough, unspecified: Secondary | ICD-10-CM

## 2012-12-06 DIAGNOSIS — R05 Cough: Secondary | ICD-10-CM

## 2012-12-06 DIAGNOSIS — I10 Essential (primary) hypertension: Secondary | ICD-10-CM | POA: Diagnosis not present

## 2012-12-06 LAB — POCT URINALYSIS DIPSTICK
Blood, UA: NEGATIVE
Ketones, UA: NEGATIVE
Protein, UA: NEGATIVE
Spec Grav, UA: 1.02
Urobilinogen, UA: 0.2
pH, UA: 5.5

## 2012-12-06 LAB — MICROALBUMIN / CREATININE URINE RATIO
Creatinine,U: 65.9 mg/dL
Microalb, Ur: 1 mg/dL (ref 0.0–1.9)

## 2012-12-08 ENCOUNTER — Encounter: Payer: Self-pay | Admitting: Internal Medicine

## 2012-12-08 NOTE — Assessment & Plan Note (Signed)
Low cholesterol diet.  Continue simvastatin.  Follow lipid panel and liver function.   

## 2012-12-08 NOTE — Assessment & Plan Note (Signed)
Blood pressure under good control.  Follow metabolic panel.  Same medication regimen.   

## 2012-12-08 NOTE — Assessment & Plan Note (Signed)
Had mammogram 8/14.  Need results.  States everything checked out fine.    

## 2012-12-08 NOTE — Assessment & Plan Note (Signed)
Low carb diet.  Follow metabolic panel and a1c.  Up to date with eye checks.    

## 2012-12-08 NOTE — Assessment & Plan Note (Signed)
Resolved

## 2012-12-08 NOTE — Progress Notes (Signed)
  Subjective:    [name], female    DOB: 1940-10-14, 72 y.o.   MRN: [id number]  HPI 72 year old female with past history of hypertension, diabetes, hypercholesterolemia and breast cancer s/p left breast mastectomy who comes in today for a scheduled follow up.   She states she has been doing well.   No chest pain or tightness.  Breathing stable.  Bowels doing well.  No acid reflux.  Seeing Dr Cheree Ditto.  Is planned to f/u with her in 3 months.     Past Medical History  Diagnosis Date  . Hypertension   . Diabetes mellitus   . Breast cancer     s/p lumpectomy 1992.  s/p chemo and xrt  . Hypercholesterolemia   . Atrial fibrillation   . Anemia   . GERD (gastroesophageal reflux disease)   . Pulmonary emboli   . Gastric ulcer     Current Outpatient Prescriptions on File Prior to Visit  Medication Sig Dispense Refill  . ferrous sulfate 325 (65 FE) MG tablet Take 325 mg by mouth daily with breakfast.      . fluticasone (FLONASE) 50 MCG/ACT nasal spray Place 2 sprays into the nose daily.  16 g  6  . lisinopril (PRINIVIL,ZESTRIL) 20 MG tablet Take 20 mg by mouth daily.      . pantoprazole (PROTONIX) 40 MG tablet Take 40 mg by mouth daily.      . simvastatin (ZOCOR) 10 MG tablet Take 10 mg by mouth at bedtime.      Marland Kitchen warfarin (COUMADIN) 2.5 MG tablet As directed  30 tablet  5   No current facility-administered medications on file prior to visit.    Review of Systems Patient denies any headache, lightheadedness or dizziness.  No sinus or allergy problems.  No chest pain, tightness or palpitations.  No increased shortness of breath, cough or congestion.  No nausea or vomiting.  No abdominal pain or cramping.  No bowel change, such as diarrhea, constipation, BRBPR or melana.  No urine change.  Just had eye exam two weeks ago.       Objective:   Physical Exam  Filed Vitals:   12/06/12 1055  BP: 118/70  Pulse: 68  Temp: 97.9 F (25.74 C)   72 year old female in no acute  distress.   HEENT:  Nares- clear.  Oropharynx - without lesions. NECK:  Supple.  Nontender.  No audible bruit.  HEART:  Appears to be regular. LUNGS:  No crackles or wheezing audible.  Respirations even and unlabored.  RADIAL PULSE:  Equal bilaterally.   ABDOMEN:  Soft, nontender.  Bowel sounds present and normal.  No audible abdominal bruit.    EXTREMITIES:  No increased edema present.  DP pulses palpable and equal bilaterally.             Assessment & Plan:  MSK.  Left hip and buttock pain - resolved.    HEALTH MAINTENANCE.  Physical 04/05/12.   Had mammogram in 8/14.  Need results.

## 2012-12-09 ENCOUNTER — Encounter: Payer: Self-pay | Admitting: Internal Medicine

## 2012-12-11 DIAGNOSIS — Z23 Encounter for immunization: Secondary | ICD-10-CM | POA: Diagnosis not present

## 2012-12-23 ENCOUNTER — Other Ambulatory Visit: Payer: Medicare Other

## 2012-12-25 ENCOUNTER — Other Ambulatory Visit (INDEPENDENT_AMBULATORY_CARE_PROVIDER_SITE_OTHER): Payer: Medicare Other

## 2012-12-25 ENCOUNTER — Other Ambulatory Visit: Payer: Self-pay | Admitting: Internal Medicine

## 2012-12-25 ENCOUNTER — Encounter: Payer: Self-pay | Admitting: Internal Medicine

## 2012-12-25 ENCOUNTER — Telehealth: Payer: Self-pay | Admitting: Internal Medicine

## 2012-12-25 DIAGNOSIS — I4891 Unspecified atrial fibrillation: Secondary | ICD-10-CM | POA: Diagnosis not present

## 2012-12-25 DIAGNOSIS — E7800 Pure hypercholesterolemia, unspecified: Secondary | ICD-10-CM

## 2012-12-25 DIAGNOSIS — I2699 Other pulmonary embolism without acute cor pulmonale: Secondary | ICD-10-CM

## 2012-12-25 DIAGNOSIS — E119 Type 2 diabetes mellitus without complications: Secondary | ICD-10-CM | POA: Diagnosis not present

## 2012-12-25 LAB — PROTIME-INR
INR: 2.5 ratio — ABNORMAL HIGH (ref 0.8–1.0)
Prothrombin Time: 26.4 s — ABNORMAL HIGH (ref 10.2–12.4)

## 2012-12-25 LAB — HEPATIC FUNCTION PANEL
AST: 26 U/L (ref 0–37)
Albumin: 4 g/dL (ref 3.5–5.2)
Alkaline Phosphatase: 36 U/L — ABNORMAL LOW (ref 39–117)
Bilirubin, Direct: 0.1 mg/dL (ref 0.0–0.3)
Total Protein: 6.9 g/dL (ref 6.0–8.3)

## 2012-12-25 LAB — BASIC METABOLIC PANEL
BUN: 21 mg/dL (ref 6–23)
CO2: 26 mEq/L (ref 19–32)
Chloride: 104 mEq/L (ref 96–112)
Glucose, Bld: 96 mg/dL (ref 70–99)
Potassium: 4.3 mEq/L (ref 3.5–5.1)

## 2012-12-25 LAB — LIPID PANEL
Cholesterol: 158 mg/dL (ref 0–200)
Total CHOL/HDL Ratio: 3

## 2012-12-25 LAB — HEMOGLOBIN A1C: Hgb A1c MFr Bld: 6.1 % (ref 4.6–6.5)

## 2012-12-25 NOTE — Telephone Encounter (Signed)
Chart has already been updated.

## 2012-12-25 NOTE — Telephone Encounter (Signed)
Pt came in today for labs and wanted to let dr scott know she received her flu shot at [facility] pharmacy 12/11/12

## 2012-12-25 NOTE — Progress Notes (Signed)
Order placed for pt/inr 

## 2012-12-26 ENCOUNTER — Encounter: Payer: Self-pay | Admitting: Internal Medicine

## 2012-12-26 ENCOUNTER — Encounter: Payer: Self-pay | Admitting: *Deleted

## 2013-01-14 DIAGNOSIS — K219 Gastro-esophageal reflux disease without esophagitis: Secondary | ICD-10-CM | POA: Diagnosis not present

## 2013-01-15 ENCOUNTER — Ambulatory Visit (INDEPENDENT_AMBULATORY_CARE_PROVIDER_SITE_OTHER): Payer: Medicare Other | Admitting: Podiatry

## 2013-01-15 ENCOUNTER — Ambulatory Visit (INDEPENDENT_AMBULATORY_CARE_PROVIDER_SITE_OTHER): Payer: Medicare Other

## 2013-01-15 ENCOUNTER — Encounter: Payer: Self-pay | Admitting: Podiatry

## 2013-01-15 VITALS — BP 117/66 | HR 83 | Resp 16 | Ht 63.0 in | Wt 239.0 lb

## 2013-01-15 DIAGNOSIS — R52 Pain, unspecified: Secondary | ICD-10-CM

## 2013-01-15 DIAGNOSIS — E119 Type 2 diabetes mellitus without complications: Secondary | ICD-10-CM

## 2013-01-15 DIAGNOSIS — B351 Tinea unguium: Secondary | ICD-10-CM | POA: Diagnosis not present

## 2013-01-15 DIAGNOSIS — M79609 Pain in unspecified limb: Secondary | ICD-10-CM

## 2013-01-15 NOTE — Progress Notes (Signed)
Anita Walker presents today as a 72 year old white female with a chief complaint of elongated painful nails. He states that her blood sugars under good control and she has no numbness or tingling in her feet. She's been diabetic for 7 years without complications.  Objective: Vital signs are stable she is alert oriented x3. I have reviewed her past medical history medications and allergies. Extremity evaluation demonstrates strong palpable pulses bilateral neurologic sensorium is intact per Semmes-Weinstein monofilament bilaterally. Deep tendon reflexes are brisk bilateral muscle strength is 5 over 5 dorsiflexors plantar flexors inverters and evertors are intact. All intrinsic musculature is intact he continues evaluation Mr. is supple well hydrated cutis with exception of the xerosis to the plantar aspect of the bilateral foot. Nail plates are thick yellow dystrophic clinically mycotic and painful. Orthopedic evaluation Mr. is all joints distal to the ankle a full range of motion without crepitus. Mild pes planus is noted bilateral. Radiographic evaluation demonstrates mild pes planus mild osteopenia mild hammertoe deformities and spurring. 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  Assessment: Pain in limb secondary to onychomycosis bilateral.  Plan debridement nails 1 through 5 bilateral is cover service secondary to pain.

## 2013-01-22 ENCOUNTER — Other Ambulatory Visit (INDEPENDENT_AMBULATORY_CARE_PROVIDER_SITE_OTHER): Payer: Medicare Other

## 2013-01-22 DIAGNOSIS — I2699 Other pulmonary embolism without acute cor pulmonale: Secondary | ICD-10-CM | POA: Diagnosis not present

## 2013-01-22 LAB — PROTIME-INR: INR: 2.1 ratio — ABNORMAL HIGH (ref 0.8–1.0)

## 2013-02-19 DIAGNOSIS — D485 Neoplasm of uncertain behavior of skin: Secondary | ICD-10-CM | POA: Diagnosis not present

## 2013-02-19 DIAGNOSIS — L570 Actinic keratosis: Secondary | ICD-10-CM | POA: Diagnosis not present

## 2013-02-19 DIAGNOSIS — L821 Other seborrheic keratosis: Secondary | ICD-10-CM | POA: Diagnosis not present

## 2013-02-19 DIAGNOSIS — C44319 Basal cell carcinoma of skin of other parts of face: Secondary | ICD-10-CM | POA: Diagnosis not present

## 2013-02-21 ENCOUNTER — Encounter: Payer: Self-pay | Admitting: *Deleted

## 2013-02-21 ENCOUNTER — Other Ambulatory Visit: Payer: Self-pay | Admitting: Internal Medicine

## 2013-02-21 ENCOUNTER — Other Ambulatory Visit (INDEPENDENT_AMBULATORY_CARE_PROVIDER_SITE_OTHER): Payer: Medicare Other

## 2013-02-21 DIAGNOSIS — I2699 Other pulmonary embolism without acute cor pulmonale: Secondary | ICD-10-CM | POA: Diagnosis not present

## 2013-02-21 LAB — PROTIME-INR: Prothrombin Time: 23.4 s — ABNORMAL HIGH (ref 10.2–12.4)

## 2013-02-21 NOTE — Progress Notes (Signed)
Order placed for f/u pt/inr 

## 2013-02-24 ENCOUNTER — Encounter: Payer: Self-pay | Admitting: *Deleted

## 2013-03-21 ENCOUNTER — Other Ambulatory Visit (INDEPENDENT_AMBULATORY_CARE_PROVIDER_SITE_OTHER): Payer: Medicare Other

## 2013-03-21 DIAGNOSIS — I2699 Other pulmonary embolism without acute cor pulmonale: Secondary | ICD-10-CM

## 2013-03-21 LAB — PROTIME-INR
INR: 2.6 ratio — ABNORMAL HIGH (ref 0.8–1.0)
Prothrombin Time: 27.5 s — ABNORMAL HIGH (ref 10.2–12.4)

## 2013-04-03 ENCOUNTER — Other Ambulatory Visit: Payer: Self-pay | Admitting: Internal Medicine

## 2013-04-07 ENCOUNTER — Other Ambulatory Visit: Payer: Medicare Other

## 2013-04-07 ENCOUNTER — Other Ambulatory Visit (INDEPENDENT_AMBULATORY_CARE_PROVIDER_SITE_OTHER): Payer: Medicare Other

## 2013-04-07 ENCOUNTER — Encounter: Payer: Self-pay | Admitting: *Deleted

## 2013-04-07 DIAGNOSIS — I2699 Other pulmonary embolism without acute cor pulmonale: Secondary | ICD-10-CM

## 2013-04-07 LAB — PROTIME-INR
INR: 2.2 ratio — ABNORMAL HIGH (ref 0.8–1.0)
Prothrombin Time: 23.4 s — ABNORMAL HIGH (ref 10.2–12.4)

## 2013-04-08 ENCOUNTER — Encounter: Payer: Self-pay | Admitting: Internal Medicine

## 2013-04-08 NOTE — Telephone Encounter (Signed)
Duplicate message.  I sent pt a my chart message regarding her concerns.

## 2013-04-11 ENCOUNTER — Encounter: Payer: Self-pay | Admitting: Internal Medicine

## 2013-04-11 ENCOUNTER — Encounter: Payer: Medicare Other | Admitting: Internal Medicine

## 2013-04-11 ENCOUNTER — Ambulatory Visit (INDEPENDENT_AMBULATORY_CARE_PROVIDER_SITE_OTHER): Payer: Medicare Other | Admitting: Internal Medicine

## 2013-04-11 VITALS — BP 122/62 | HR 84 | Temp 97.9°F | Wt 242.0 lb

## 2013-04-11 DIAGNOSIS — Z7901 Long term (current) use of anticoagulants: Secondary | ICD-10-CM | POA: Insufficient documentation

## 2013-04-11 DIAGNOSIS — I998 Other disorder of circulatory system: Secondary | ICD-10-CM

## 2013-04-11 DIAGNOSIS — R58 Hemorrhage, not elsewhere classified: Secondary | ICD-10-CM | POA: Insufficient documentation

## 2013-04-11 NOTE — Progress Notes (Signed)
   Subjective:    Patient ID: Britnie Martinique, female    DOB: 11/29/1940, 73 y.o.   MRN: [id number]  HPI 73 year old female with history of pulmonary embolus on chronic anticoagulation with Coumadin presents for acute visit. On 04/09/2013 she had a venipuncture in our clinic. Reportedly, she jerked her arm during the procedure and they were unable to get a sample. She was then referred to our [facility] office where 3 attempts were made prior to obtaining a blood specimen. Since that time, she has had some bruising across her right forearm. She is concerned about this. She is also frustrated about having to drive to our [employer].  Review of Systems  Constitutional: Negative for fever, chills and fatigue.  Respiratory: Negative for shortness of breath.   Cardiovascular: Negative for chest pain.  Musculoskeletal: Negative for myalgias.  Skin: Positive for color change.       Objective:    BP 122/62  Pulse 84  Temp(Src) 97.9 F (36.6 C) (Oral)  Wt 242 lb (109.77 kg)  SpO2 96% Physical Exam  Constitutional: She is oriented to person, place, and time. She appears well-developed and well-nourished. No distress.  HENT:  Head: Normocephalic and atraumatic.  Right Ear: External ear normal.  Left Ear: External ear normal.  Nose: Nose normal.  Mouth/Throat: Oropharynx is clear and moist.  Eyes: Conjunctivae are normal. Pupils are equal, round, and reactive to light. Right eye exhibits no discharge. Left eye exhibits no discharge. No scleral icterus.  Neck: Normal range of motion. Neck supple. No tracheal deviation present. No thyromegaly present.  Pulmonary/Chest: Effort normal. No accessory muscle usage. Not tachypneic. She has no decreased breath sounds. She has no rhonchi.  Musculoskeletal: Normal range of motion. She exhibits no edema and no tenderness.  Lymphadenopathy:    She has no cervical adenopathy.  Neurological: She is alert and oriented to person, place, and time. No  cranial nerve deficit. She exhibits normal muscle tone. Coordination normal.  Skin: Skin is warm and dry. Ecchymosis noted. No rash noted. She is not diaphoretic. No erythema. No pallor.     Psychiatric: She has a normal mood and affect. Her behavior is normal. Judgment and thought content normal.          Assessment & Plan:   Problem List Items Addressed This Visit   Chronic anticoagulation - Primary   Relevant Orders      Ambulatory referral to [place]    Other Visit Diagnoses   Ecchymosis            Return if symptoms worsen or fail to improve.

## 2013-04-11 NOTE — Progress Notes (Signed)
Pre-visit discussion using our clinic review tool. No additional management support is needed unless otherwise documented below in the visit note.  

## 2013-04-11 NOTE — Assessment & Plan Note (Signed)
Explained to patient that ecchymosis after venipuncture is relatively common, particularly given that she is on Coumadin. We discussed that it will likely take several weeks for these to resolve.

## 2013-04-11 NOTE — Assessment & Plan Note (Signed)
Patient is on chronic anticoagulation with Coumadin for history of pulmonary embolus. Recent Coumadin level was therapeutic. She is frustrated by difficulty with venipuncture. We discussed options including referring her to our nurse clinic at [facility] for fingerstick INR versus home health measurement of INR. She would prefer to pursue home health management of possible. Referral placed.

## 2013-04-14 ENCOUNTER — Ambulatory Visit: Payer: Medicare Other | Admitting: Internal Medicine

## 2013-04-15 DIAGNOSIS — J069 Acute upper respiratory infection, unspecified: Secondary | ICD-10-CM | POA: Diagnosis not present

## 2013-04-16 ENCOUNTER — Ambulatory Visit: Payer: Medicare Other | Admitting: Podiatry

## 2013-04-16 DIAGNOSIS — J209 Acute bronchitis, unspecified: Secondary | ICD-10-CM | POA: Diagnosis not present

## 2013-04-25 ENCOUNTER — Ambulatory Visit: Payer: Self-pay

## 2013-04-25 DIAGNOSIS — R112 Nausea with vomiting, unspecified: Secondary | ICD-10-CM | POA: Diagnosis not present

## 2013-04-25 DIAGNOSIS — J40 Bronchitis, not specified as acute or chronic: Secondary | ICD-10-CM | POA: Diagnosis not present

## 2013-04-25 DIAGNOSIS — I10 Essential (primary) hypertension: Secondary | ICD-10-CM | POA: Diagnosis not present

## 2013-04-25 DIAGNOSIS — R0602 Shortness of breath: Secondary | ICD-10-CM | POA: Diagnosis not present

## 2013-04-25 DIAGNOSIS — E7800 Pure hypercholesterolemia, unspecified: Secondary | ICD-10-CM | POA: Diagnosis not present

## 2013-04-25 DIAGNOSIS — Z79899 Other long term (current) drug therapy: Secondary | ICD-10-CM | POA: Diagnosis not present

## 2013-04-25 DIAGNOSIS — Z86718 Personal history of other venous thrombosis and embolism: Secondary | ICD-10-CM | POA: Diagnosis not present

## 2013-04-25 DIAGNOSIS — Z7901 Long term (current) use of anticoagulants: Secondary | ICD-10-CM | POA: Diagnosis not present

## 2013-04-25 LAB — URINALYSIS, COMPLETE
Glucose,UR: NEGATIVE mg/dL (ref 0–75)
Ketone: NEGATIVE
NITRITE: NEGATIVE
Ph: 6 (ref 4.5–8.0)
Protein: NEGATIVE
Specific Gravity: 1.02 (ref 1.003–1.030)

## 2013-04-25 LAB — COMPREHENSIVE METABOLIC PANEL
ALK PHOS: 44 U/L — AB
ALT: 28 U/L (ref 12–78)
AST: 19 U/L (ref 15–37)
Albumin: 3.9 g/dL (ref 3.4–5.0)
Anion Gap: 12 (ref 7–16)
BUN: 14 mg/dL (ref 7–18)
Bilirubin,Total: 0.4 mg/dL (ref 0.2–1.0)
CHLORIDE: 101 mmol/L (ref 98–107)
Calcium, Total: 9.4 mg/dL (ref 8.5–10.1)
Co2: 25 mmol/L (ref 21–32)
Creatinine: 1.02 mg/dL (ref 0.60–1.30)
EGFR (Non-African Amer.): 54 — ABNORMAL LOW
Glucose: 124 mg/dL — ABNORMAL HIGH (ref 65–99)
Osmolality: 278 (ref 275–301)
Potassium: 4.2 mmol/L (ref 3.5–5.1)
Sodium: 138 mmol/L (ref 136–145)
Total Protein: 7.6 g/dL (ref 6.4–8.2)

## 2013-04-25 LAB — CBC WITH DIFFERENTIAL/PLATELET
BASOS ABS: 0.1 10*3/uL (ref 0.0–0.1)
BASOS PCT: 0.8 %
EOS ABS: 0.1 10*3/uL (ref 0.0–0.7)
Eosinophil %: 0.7 %
HCT: 46.4 % (ref 35.0–47.0)
HGB: 15.4 g/dL (ref 12.0–16.0)
LYMPHS PCT: 12.7 %
Lymphocyte #: 1.5 10*3/uL (ref 1.0–3.6)
MCH: 29.7 pg (ref 26.0–34.0)
MCHC: 33.3 g/dL (ref 32.0–36.0)
MCV: 89 fL (ref 80–100)
Monocyte #: 1 x10 3/mm — ABNORMAL HIGH (ref 0.2–0.9)
Monocyte %: 8.4 %
Neutrophil #: 9.3 10*3/uL — ABNORMAL HIGH (ref 1.4–6.5)
Neutrophil %: 77.4 %
PLATELETS: 263 10*3/uL (ref 150–440)
RBC: 5.2 10*6/uL (ref 3.80–5.20)
RDW: 14.1 % (ref 11.5–14.5)
WBC: 12 10*3/uL — ABNORMAL HIGH (ref 3.6–11.0)

## 2013-04-25 LAB — RAPID INFLUENZA A&B ANTIGENS (ARMC ONLY)

## 2013-04-27 LAB — URINE CULTURE

## 2013-04-29 ENCOUNTER — Encounter: Payer: Self-pay | Admitting: Internal Medicine

## 2013-04-29 DIAGNOSIS — C50919 Malignant neoplasm of unspecified site of unspecified female breast: Secondary | ICD-10-CM

## 2013-04-30 DIAGNOSIS — C44319 Basal cell carcinoma of skin of other parts of face: Secondary | ICD-10-CM | POA: Diagnosis not present

## 2013-05-03 ENCOUNTER — Ambulatory Visit: Payer: Self-pay

## 2013-05-03 DIAGNOSIS — J069 Acute upper respiratory infection, unspecified: Secondary | ICD-10-CM | POA: Diagnosis not present

## 2013-05-03 DIAGNOSIS — R059 Cough, unspecified: Secondary | ICD-10-CM | POA: Diagnosis not present

## 2013-05-03 DIAGNOSIS — Z853 Personal history of malignant neoplasm of breast: Secondary | ICD-10-CM | POA: Diagnosis not present

## 2013-05-03 DIAGNOSIS — Z79899 Other long term (current) drug therapy: Secondary | ICD-10-CM | POA: Diagnosis not present

## 2013-05-03 DIAGNOSIS — R05 Cough: Secondary | ICD-10-CM | POA: Diagnosis not present

## 2013-05-03 DIAGNOSIS — E7800 Pure hypercholesterolemia, unspecified: Secondary | ICD-10-CM | POA: Diagnosis not present

## 2013-05-03 DIAGNOSIS — I10 Essential (primary) hypertension: Secondary | ICD-10-CM | POA: Diagnosis not present

## 2013-05-03 DIAGNOSIS — Z86718 Personal history of other venous thrombosis and embolism: Secondary | ICD-10-CM | POA: Diagnosis not present

## 2013-05-03 DIAGNOSIS — Z9010 Acquired absence of unspecified breast and nipple: Secondary | ICD-10-CM | POA: Diagnosis not present

## 2013-05-03 DIAGNOSIS — J40 Bronchitis, not specified as acute or chronic: Secondary | ICD-10-CM | POA: Diagnosis not present

## 2013-05-06 ENCOUNTER — Other Ambulatory Visit: Payer: Medicare Other

## 2013-05-06 ENCOUNTER — Other Ambulatory Visit (INDEPENDENT_AMBULATORY_CARE_PROVIDER_SITE_OTHER): Payer: Medicare Other

## 2013-05-06 DIAGNOSIS — I2699 Other pulmonary embolism without acute cor pulmonale: Secondary | ICD-10-CM | POA: Diagnosis not present

## 2013-05-06 LAB — PROTIME-INR
INR: 2.3 ratio — AB (ref 0.8–1.0)
PROTHROMBIN TIME: 24 s — AB (ref 10.2–12.4)

## 2013-05-07 ENCOUNTER — Encounter: Payer: Self-pay | Admitting: *Deleted

## 2013-05-09 ENCOUNTER — Other Ambulatory Visit: Payer: Self-pay | Admitting: Internal Medicine

## 2013-05-09 ENCOUNTER — Ambulatory Visit (INDEPENDENT_AMBULATORY_CARE_PROVIDER_SITE_OTHER): Payer: Medicare Other | Admitting: Internal Medicine

## 2013-05-09 ENCOUNTER — Encounter: Payer: Self-pay | Admitting: Internal Medicine

## 2013-05-09 VITALS — BP 130/70 | HR 83 | Temp 97.6°F | Ht 67.5 in | Wt 237.2 lb

## 2013-05-09 DIAGNOSIS — I2699 Other pulmonary embolism without acute cor pulmonale: Secondary | ICD-10-CM

## 2013-05-09 DIAGNOSIS — E119 Type 2 diabetes mellitus without complications: Secondary | ICD-10-CM | POA: Diagnosis not present

## 2013-05-09 DIAGNOSIS — C50919 Malignant neoplasm of unspecified site of unspecified female breast: Secondary | ICD-10-CM

## 2013-05-09 DIAGNOSIS — I10 Essential (primary) hypertension: Secondary | ICD-10-CM | POA: Diagnosis not present

## 2013-05-09 DIAGNOSIS — R05 Cough: Secondary | ICD-10-CM

## 2013-05-09 DIAGNOSIS — E7800 Pure hypercholesterolemia, unspecified: Secondary | ICD-10-CM | POA: Diagnosis not present

## 2013-05-09 DIAGNOSIS — R059 Cough, unspecified: Secondary | ICD-10-CM

## 2013-05-09 NOTE — Progress Notes (Signed)
Pre-visit discussion using our clinic review tool. No additional management support is needed unless otherwise documented below in the visit note.  

## 2013-05-09 NOTE — Progress Notes (Signed)
  Subjective:    [name], female    DOB: 20-Jul-1940, 73 y.o.   MRN: [id number]  HPI 73 year old female with past history of hypertension, diabetes, hypercholesterolemia and breast cancer s/p left breast mastectomy who comes in today to follow up on these issues as well as for a complete physical exam.   She states she has been doing well.   No chest pain or tightness.  Breathing stable.  Previously was seen at urgent care.  Had cxr.  Was told clear.  Cough resolved now.  No sob.  Bowels doing well.  No acid reflux.  Seeing Dr Phillip Heal.  Sugars doing well.  Last week sugar - 113.     Past Medical History  Diagnosis Date  . Hypertension   . Diabetes mellitus   . Breast cancer     s/p lumpectomy 1992.  s/p chemo and xrt  . Hypercholesterolemia   . Atrial fibrillation   . Anemia   . GERD (gastroesophageal reflux disease)   . Pulmonary emboli   . Gastric ulcer     Current Outpatient Prescriptions on File Prior to Visit  Medication Sig Dispense Refill  . ferrous sulfate 325 (65 FE) MG tablet Take 325 mg by mouth daily with breakfast.      . fluticasone (FLONASE) 50 MCG/ACT nasal spray Place 2 sprays into the nose daily.  16 g  6  . lisinopril (PRINIVIL,ZESTRIL) 20 MG tablet TAKE ONE (1) TABLET BY MOUTH EVERY DAY  90 tablet  1  . pantoprazole (PROTONIX) 40 MG tablet Take 40 mg by mouth daily.      Marland Kitchen warfarin (COUMADIN) 2.5 MG tablet TAKE 2 TABLETS (5MG ) ON MONDAY , WEDNESDAY AND FRIDAY AND TAKE 1 TABLET (2.5MG ) ON ALL OTHER DAYS  120 tablet  1   No current facility-administered medications on file prior to visit.    Review of Systems Patient denies any headache, lightheadedness or dizziness.  No sinus or allergy problems.  No chest pain, tightness or palpitations.  No increased shortness of breath, cough or congestion. Cough and congestion resolved.   No nausea or vomiting.  No abdominal pain or cramping.  No bowel change, such as diarrhea, constipation, BRBPR or melana.  No  urine change.       Objective:   Physical Exam  Filed Vitals:   05/09/13 1424  BP: 130/70  Pulse: 83  Temp: 97.6 F (36.4 C)   Blood pressure recheck:  26/47  73 year old female in no acute distress.   HEENT:  Nares- clear.  Oropharynx - without lesions. NECK:  Supple.  Nontender.  No audible bruit.  HEART:  Appears to be regular. LUNGS:  No crackles or wheezing audible.  Respirations even and unlabored.  RADIAL PULSE:  Equal bilaterally.    BREASTS:  No nipple discharge or nipple retraction present.  Could not appreciate any distinct nodules or axillary adenopathy.  ABDOMEN:  Soft, nontender.  Bowel sounds present and normal.  No audible abdominal bruit.    EXTREMITIES:  No increased edema present.  DP pulses palpable and equal bilaterally.  FEET:  without lesions.         Assessment & Plan:  HEALTH MAINTENANCE.  Physical today.   Had mammogram in 8/14.  Still need results.

## 2013-05-11 ENCOUNTER — Encounter: Payer: Self-pay | Admitting: Internal Medicine

## 2013-05-11 NOTE — Assessment & Plan Note (Signed)
Low cholesterol diet.  Continue simvastatin.  Follow lipid panel and liver function.   

## 2013-05-11 NOTE — Assessment & Plan Note (Signed)
Had mammogram 8/14.  Need results.  States everything checked out fine.

## 2013-05-11 NOTE — Assessment & Plan Note (Signed)
Low carb diet.  Follow metabolic panel and a1c.  Up to date with eye checks.    

## 2013-05-11 NOTE — Assessment & Plan Note (Signed)
Resolved.  Obtain cxr results from urgent care.

## 2013-05-11 NOTE — Assessment & Plan Note (Signed)
Blood pressure under good control.  Follow metabolic panel.  Same medication regimen.   

## 2013-05-28 ENCOUNTER — Ambulatory Visit (INDEPENDENT_AMBULATORY_CARE_PROVIDER_SITE_OTHER): Payer: Medicare Other | Admitting: Podiatry

## 2013-05-28 VITALS — BP 116/71 | HR 89 | Resp 16

## 2013-05-28 DIAGNOSIS — M79609 Pain in unspecified limb: Secondary | ICD-10-CM | POA: Diagnosis not present

## 2013-05-28 DIAGNOSIS — E119 Type 2 diabetes mellitus without complications: Secondary | ICD-10-CM

## 2013-05-28 DIAGNOSIS — B351 Tinea unguium: Secondary | ICD-10-CM

## 2013-05-28 NOTE — Progress Notes (Signed)
She presents today with a chief complaint of painful elongated toenails.  Objective: Vital signs are stable she is alert and oriented x3. Her nails are thick yellow dystrophic onychomycotic and painful palpation.  Assessment: Pain in limb secondary to onychomycosis 1 through 5 bilateral.  Plan: Debridement of nails 1 through 5 bilateral is cover service secondary to pain.

## 2013-06-04 ENCOUNTER — Other Ambulatory Visit (INDEPENDENT_AMBULATORY_CARE_PROVIDER_SITE_OTHER): Payer: Medicare Other

## 2013-06-04 ENCOUNTER — Other Ambulatory Visit: Payer: Medicare Other

## 2013-06-04 DIAGNOSIS — E7800 Pure hypercholesterolemia, unspecified: Secondary | ICD-10-CM

## 2013-06-04 DIAGNOSIS — C50919 Malignant neoplasm of unspecified site of unspecified female breast: Secondary | ICD-10-CM

## 2013-06-04 DIAGNOSIS — I2699 Other pulmonary embolism without acute cor pulmonale: Secondary | ICD-10-CM

## 2013-06-04 DIAGNOSIS — E119 Type 2 diabetes mellitus without complications: Secondary | ICD-10-CM | POA: Diagnosis not present

## 2013-06-04 DIAGNOSIS — I10 Essential (primary) hypertension: Secondary | ICD-10-CM | POA: Diagnosis not present

## 2013-06-04 LAB — HEMOGLOBIN A1C: Hgb A1c MFr Bld: 5.9 % (ref 4.6–6.5)

## 2013-06-04 LAB — PROTIME-INR
INR: 2 ratio — ABNORMAL HIGH (ref 0.8–1.0)
PROTHROMBIN TIME: 20.9 s — AB (ref 10.2–12.4)

## 2013-06-04 LAB — LIPID PANEL
CHOL/HDL RATIO: 3
CHOLESTEROL: 154 mg/dL (ref 0–200)
HDL: 51.6 mg/dL (ref >39.00)
LDL CALC: 87 mg/dL (ref 0–99)
Triglycerides: 77 mg/dL (ref 0.0–149.0)
VLDL: 15.4 mg/dL (ref 0.0–40.0)

## 2013-06-04 LAB — HEPATIC FUNCTION PANEL
ALBUMIN: 3.9 g/dL (ref 3.5–5.2)
ALK PHOS: 33 U/L — AB (ref 39–117)
ALT: 20 U/L (ref 0–35)
AST: 25 U/L (ref 0–37)
Bilirubin, Direct: 0.1 mg/dL (ref 0.0–0.3)
TOTAL PROTEIN: 6.5 g/dL (ref 6.0–8.3)
Total Bilirubin: 0.8 mg/dL (ref 0.3–1.2)

## 2013-06-04 LAB — CBC WITH DIFFERENTIAL/PLATELET
BASOS PCT: 0.6 % (ref 0.0–3.0)
Basophils Absolute: 0 10*3/uL (ref 0.0–0.1)
EOS ABS: 0.1 10*3/uL (ref 0.0–0.7)
EOS PCT: 1.8 % (ref 0.0–5.0)
HCT: 39 % (ref 36.0–46.0)
Hemoglobin: 13.1 g/dL (ref 12.0–15.0)
Lymphocytes Relative: 31.8 % (ref 12.0–46.0)
Lymphs Abs: 1.8 10*3/uL (ref 0.7–4.0)
MCHC: 33.6 g/dL (ref 30.0–36.0)
MCV: 90.2 fl (ref 78.0–100.0)
Monocytes Absolute: 0.7 10*3/uL (ref 0.1–1.0)
Monocytes Relative: 12.9 % — ABNORMAL HIGH (ref 3.0–12.0)
NEUTROS ABS: 3 10*3/uL (ref 1.4–7.7)
NEUTROS PCT: 52.9 % (ref 43.0–77.0)
PLATELETS: 217 10*3/uL (ref 150.0–400.0)
RBC: 4.32 Mil/uL (ref 3.87–5.11)
RDW: 14.8 % — AB (ref 11.5–14.6)
WBC: 5.6 10*3/uL (ref 4.5–10.5)

## 2013-06-04 LAB — BASIC METABOLIC PANEL
BUN: 20 mg/dL (ref 6–23)
CHLORIDE: 102 meq/L (ref 96–112)
CO2: 25 meq/L (ref 19–32)
Calcium: 10 mg/dL (ref 8.4–10.5)
Creatinine, Ser: 0.9 mg/dL (ref 0.4–1.2)
GFR: 63.22 mL/min (ref >60.00)
Glucose, Bld: 93 mg/dL (ref 70–99)
POTASSIUM: 4.3 meq/L (ref 3.5–5.1)
Sodium: 134 mEq/L — ABNORMAL LOW (ref 135–145)

## 2013-06-04 LAB — TSH: TSH: 3.31 u[IU]/mL (ref 0.35–5.50)

## 2013-06-12 ENCOUNTER — Telehealth: Payer: Self-pay | Admitting: *Deleted

## 2013-06-12 DIAGNOSIS — E871 Hypo-osmolality and hyponatremia: Secondary | ICD-10-CM

## 2013-06-12 NOTE — Telephone Encounter (Signed)
What labs and dx? Pt is coming in tomorrow

## 2013-06-13 ENCOUNTER — Other Ambulatory Visit (INDEPENDENT_AMBULATORY_CARE_PROVIDER_SITE_OTHER): Payer: Medicare Other

## 2013-06-13 ENCOUNTER — Encounter: Payer: Self-pay | Admitting: Internal Medicine

## 2013-06-13 DIAGNOSIS — E871 Hypo-osmolality and hyponatremia: Secondary | ICD-10-CM | POA: Diagnosis not present

## 2013-06-13 LAB — SODIUM: Sodium: 139 mEq/L (ref 135–145)

## 2013-06-13 NOTE — Telephone Encounter (Signed)
Order placed for f/u sodium.  ?

## 2013-06-15 ENCOUNTER — Ambulatory Visit: Payer: Self-pay | Admitting: Physician Assistant

## 2013-06-15 DIAGNOSIS — E119 Type 2 diabetes mellitus without complications: Secondary | ICD-10-CM | POA: Diagnosis not present

## 2013-06-15 DIAGNOSIS — J301 Allergic rhinitis due to pollen: Secondary | ICD-10-CM | POA: Diagnosis not present

## 2013-06-15 DIAGNOSIS — E7800 Pure hypercholesterolemia, unspecified: Secondary | ICD-10-CM | POA: Diagnosis not present

## 2013-06-15 DIAGNOSIS — I10 Essential (primary) hypertension: Secondary | ICD-10-CM | POA: Diagnosis not present

## 2013-06-15 DIAGNOSIS — Z79899 Other long term (current) drug therapy: Secondary | ICD-10-CM | POA: Diagnosis not present

## 2013-06-15 DIAGNOSIS — Z7901 Long term (current) use of anticoagulants: Secondary | ICD-10-CM | POA: Diagnosis not present

## 2013-06-18 ENCOUNTER — Encounter: Payer: Self-pay | Admitting: Internal Medicine

## 2013-06-26 ENCOUNTER — Other Ambulatory Visit: Payer: Self-pay | Admitting: *Deleted

## 2013-06-26 ENCOUNTER — Encounter: Payer: Self-pay | Admitting: Adult Health

## 2013-06-26 ENCOUNTER — Telehealth: Payer: Self-pay | Admitting: *Deleted

## 2013-06-26 ENCOUNTER — Ambulatory Visit (INDEPENDENT_AMBULATORY_CARE_PROVIDER_SITE_OTHER): Payer: Medicare Other | Admitting: Adult Health

## 2013-06-26 VITALS — BP 118/68 | HR 86 | Temp 97.8°F | Resp 14 | Wt 236.0 lb

## 2013-06-26 DIAGNOSIS — R05 Cough: Secondary | ICD-10-CM

## 2013-06-26 DIAGNOSIS — R059 Cough, unspecified: Secondary | ICD-10-CM

## 2013-06-26 MED ORDER — BENZONATATE 100 MG PO CAPS: 100.0000 mg | ORAL_CAPSULE | Freq: Two times a day (BID) | ORAL | Status: DC | PRN

## 2013-06-26 MED ORDER — WARFARIN SODIUM 2.5 MG PO TABS: ORAL_TABLET | ORAL | Status: DC

## 2013-06-26 NOTE — Patient Instructions (Signed)
  Continue using fluticasone nasal spray as directed.  I have sent in a new prescription for benzonatate 100 mg twice a day for cough  Take over the counter guaifenesin for cough. You can ask the pharmacist to show you where to find it.  Continue the zyrtec daily for allergies.  Please call if you are not any better in 4-5 days or sooner if necessary.

## 2013-06-26 NOTE — Progress Notes (Signed)
Pre visit review using our clinic review tool, if applicable. No additional management support is needed unless otherwise documented below in the visit note. 

## 2013-06-26 NOTE — Telephone Encounter (Signed)
error 

## 2013-06-26 NOTE — Progress Notes (Signed)
   Subjective:    [name], female    DOB: [birth year], 73 y.o.   MRN: [id number]  HPI Patient is a very pleasant 73 year old female who presents to clinic with cough, congestion that has been ongoing for greater than 10 days. She went to urgent care about 1.5 weeks ago and was instructed to use flonase, decongestant and antihistamine. Her symptoms have improved. Cough still not fully resolved. Denies fever, shortness of breath. Secretions are clear.  Current Outpatient Prescriptions on File Prior to Visit  Medication Sig Dispense Refill  . ferrous sulfate 325 (65 FE) MG tablet Take 325 mg by mouth daily with breakfast.      . fluticasone (FLONASE) 50 MCG/ACT nasal spray Place 2 sprays into the nose daily.  16 g  6  . lisinopril (PRINIVIL,ZESTRIL) 20 MG tablet TAKE ONE (1) TABLET BY MOUTH EVERY DAY  90 tablet  1  . pantoprazole (PROTONIX) 40 MG tablet Take 40 mg by mouth daily.      . simvastatin (ZOCOR) 10 MG tablet TAKE ONE (1) TABLET BY MOUTH EVERY DAY  90 tablet  1  . warfarin (COUMADIN) 2.5 MG tablet TAKE 2 TABLETS (5MG ) ON MONDAY , WEDNESDAY AND FRIDAY AND TAKE 1 TABLET (2.5MG ) ON ALL OTHER DAYS  120 tablet  1   No current facility-administered medications on file prior to visit.      Review of Systems  Constitutional: Negative for fever and chills.  HENT: Positive for congestion, postnasal drip, rhinorrhea, sinus pressure and sneezing. Negative for sore throat.   Respiratory: Positive for cough.   All other systems reviewed and are negative.      Objective:   Physical Exam  Constitutional: She is oriented to person, place, and time. She appears well-developed and well-nourished. No distress.  HENT:  Head: Normocephalic and atraumatic.  Right Ear: External ear normal.  Left Ear: External ear normal.  Mouth/Throat: No oropharyngeal exudate.  Cardiovascular: Normal rate, regular rhythm and normal heart sounds.  Exam reveals no gallop.   No murmur  heard. Pulmonary/Chest: Effort normal and breath sounds normal. No respiratory distress. She has no wheezes. She has no rales.  Lymphadenopathy:    She has no cervical adenopathy.  Neurological: She is alert and oriented to person, place, and time.  Psychiatric: She has a normal mood and affect. Her behavior is normal. Judgment and thought content normal.       Assessment & Plan:   1. Cough Continue flonase and zyrtec. I am sending in a new prescription for tessalon. Take otc guaifenesin. RTC if no improvement within 4-5 days

## 2013-07-02 ENCOUNTER — Other Ambulatory Visit: Payer: Self-pay | Admitting: Internal Medicine

## 2013-07-02 ENCOUNTER — Other Ambulatory Visit (INDEPENDENT_AMBULATORY_CARE_PROVIDER_SITE_OTHER): Payer: Medicare Other

## 2013-07-02 DIAGNOSIS — I2699 Other pulmonary embolism without acute cor pulmonale: Secondary | ICD-10-CM | POA: Diagnosis not present

## 2013-07-02 LAB — PROTIME-INR
INR: 2.6 ratio — ABNORMAL HIGH (ref 0.8–1.0)
PROTHROMBIN TIME: 28 s — AB (ref 9.6–13.1)

## 2013-07-02 NOTE — Progress Notes (Signed)
Order placed for f/u pt/inr

## 2013-07-11 ENCOUNTER — Other Ambulatory Visit (INDEPENDENT_AMBULATORY_CARE_PROVIDER_SITE_OTHER): Payer: Medicare Other

## 2013-07-11 ENCOUNTER — Telehealth: Payer: Self-pay | Admitting: *Deleted

## 2013-07-11 DIAGNOSIS — I2699 Other pulmonary embolism without acute cor pulmonale: Secondary | ICD-10-CM | POA: Diagnosis not present

## 2013-07-11 LAB — PROTIME-INR
INR: 3.1 ratio — ABNORMAL HIGH (ref 0.8–1.0)
PROTHROMBIN TIME: 32.9 s — AB (ref 9.6–13.1)

## 2013-07-11 NOTE — Telephone Encounter (Signed)
Pt came in for labs this morning & wanted to let you know that her husband passed away last week at [facility] & was buried Sunday. She also asked that I deactivate her mychart since she does not operate computers. Mychart was deactivated & I informed pt to let us know if she needs anything from [place] & offered our condolences.

## 2013-07-11 NOTE — Telephone Encounter (Signed)
Noted  

## 2013-07-15 ENCOUNTER — Ambulatory Visit (INDEPENDENT_AMBULATORY_CARE_PROVIDER_SITE_OTHER): Payer: Medicare Other | Admitting: Internal Medicine

## 2013-07-15 ENCOUNTER — Encounter: Payer: Self-pay | Admitting: Internal Medicine

## 2013-07-15 VITALS — BP 130/80 | HR 78 | Temp 98.1°F | Ht 67.5 in | Wt 237.5 lb

## 2013-07-15 DIAGNOSIS — I10 Essential (primary) hypertension: Secondary | ICD-10-CM

## 2013-07-15 DIAGNOSIS — R05 Cough: Secondary | ICD-10-CM | POA: Diagnosis not present

## 2013-07-15 DIAGNOSIS — R059 Cough, unspecified: Secondary | ICD-10-CM

## 2013-07-15 DIAGNOSIS — Z7901 Long term (current) use of anticoagulants: Secondary | ICD-10-CM

## 2013-07-15 DIAGNOSIS — I2699 Other pulmonary embolism without acute cor pulmonale: Secondary | ICD-10-CM

## 2013-07-15 LAB — PROTIME-INR
INR: 2.7 ratio — AB (ref 0.8–1.0)
Prothrombin Time: 29.2 s — ABNORMAL HIGH (ref 9.6–13.1)

## 2013-07-15 MED ORDER — FLUTICASONE PROPIONATE 50 MCG/ACT NA SUSP: 2.0000 | Freq: Every day | NASAL | Status: DC

## 2013-07-15 MED ORDER — AZITHROMYCIN 250 MG PO TABS: ORAL_TABLET | ORAL | Status: DC

## 2013-07-15 NOTE — Progress Notes (Signed)
Pre visit review using our clinic review tool, if applicable. No additional management support is needed unless otherwise documented below in the visit note. 

## 2013-07-15 NOTE — Patient Instructions (Signed)
Saline nasal spray - flush nose at least 2-3x/day.  Flonase nasal spray - 2 sprays each nostril one time per day.  (do this in the evening) Mucinex DM in the am and Robitussin DM in the evening.   Take antibiotic, if this does not work or if symptoms worsen.    Coumadin.  Change coumadin to 2 tablets on Sunday and Thursday and one tablet all other days.  Will let you know when to check your next level based on level today.

## 2013-07-16 ENCOUNTER — Emergency Department: Payer: Self-pay | Admitting: Emergency Medicine

## 2013-07-16 DIAGNOSIS — N390 Urinary tract infection, site not specified: Secondary | ICD-10-CM | POA: Diagnosis not present

## 2013-07-16 DIAGNOSIS — Z7901 Long term (current) use of anticoagulants: Secondary | ICD-10-CM | POA: Diagnosis not present

## 2013-07-16 DIAGNOSIS — R110 Nausea: Secondary | ICD-10-CM | POA: Diagnosis not present

## 2013-07-16 DIAGNOSIS — Z853 Personal history of malignant neoplasm of breast: Secondary | ICD-10-CM | POA: Diagnosis not present

## 2013-07-16 DIAGNOSIS — E119 Type 2 diabetes mellitus without complications: Secondary | ICD-10-CM | POA: Diagnosis not present

## 2013-07-16 DIAGNOSIS — R5381 Other malaise: Secondary | ICD-10-CM | POA: Diagnosis not present

## 2013-07-16 DIAGNOSIS — R5383 Other fatigue: Secondary | ICD-10-CM | POA: Diagnosis not present

## 2013-07-16 DIAGNOSIS — J069 Acute upper respiratory infection, unspecified: Secondary | ICD-10-CM | POA: Diagnosis not present

## 2013-07-16 DIAGNOSIS — Z86718 Personal history of other venous thrombosis and embolism: Secondary | ICD-10-CM | POA: Diagnosis not present

## 2013-07-16 DIAGNOSIS — I10 Essential (primary) hypertension: Secondary | ICD-10-CM | POA: Diagnosis not present

## 2013-07-16 DIAGNOSIS — Z79899 Other long term (current) drug therapy: Secondary | ICD-10-CM | POA: Diagnosis not present

## 2013-07-16 LAB — URINALYSIS, COMPLETE
Bacteria: NONE SEEN
Bilirubin,UR: NEGATIVE
Glucose,UR: NEGATIVE mg/dL (ref 0–75)
Ketone: NEGATIVE
Nitrite: NEGATIVE
PH: 6 (ref 4.5–8.0)
Protein: NEGATIVE
SPECIFIC GRAVITY: 1.008 (ref 1.003–1.030)
Squamous Epithelial: 1
WBC UR: 64 /HPF (ref 0–5)

## 2013-07-16 LAB — CBC WITH DIFFERENTIAL/PLATELET
BASOS ABS: 0.1 10*3/uL (ref 0.0–0.1)
BASOS PCT: 1.2 %
EOS PCT: 1.1 %
Eosinophil #: 0.1 10*3/uL (ref 0.0–0.7)
HCT: 40.3 % (ref 35.0–47.0)
HGB: 13.6 g/dL (ref 12.0–16.0)
Lymphocyte #: 1.6 10*3/uL (ref 1.0–3.6)
Lymphocyte %: 26.3 %
MCH: 30.1 pg (ref 26.0–34.0)
MCHC: 33.7 g/dL (ref 32.0–36.0)
MCV: 89 fL (ref 80–100)
MONO ABS: 0.8 x10 3/mm (ref 0.2–0.9)
Monocyte %: 13 %
Neutrophil #: 3.6 10*3/uL (ref 1.4–6.5)
Neutrophil %: 58.4 %
Platelet: 200 10*3/uL (ref 150–440)
RBC: 4.52 10*6/uL (ref 3.80–5.20)
RDW: 14.4 % (ref 11.5–14.5)
WBC: 6.1 10*3/uL (ref 3.6–11.0)

## 2013-07-16 LAB — BASIC METABOLIC PANEL
ANION GAP: 8 (ref 7–16)
BUN: 10 mg/dL (ref 7–18)
CALCIUM: 9.2 mg/dL (ref 8.5–10.1)
CHLORIDE: 104 mmol/L (ref 98–107)
CREATININE: 0.99 mg/dL (ref 0.60–1.30)
Co2: 24 mmol/L (ref 21–32)
EGFR (Non-African Amer.): 56 — ABNORMAL LOW
Glucose: 128 mg/dL — ABNORMAL HIGH (ref 65–99)
Osmolality: 273 (ref 275–301)
Potassium: 3.6 mmol/L (ref 3.5–5.1)
Sodium: 136 mmol/L (ref 136–145)

## 2013-07-16 LAB — TROPONIN I: Troponin-I: <0.02 ng/mL

## 2013-07-17 ENCOUNTER — Telehealth: Payer: Self-pay | Admitting: Internal Medicine

## 2013-07-17 NOTE — Telephone Encounter (Signed)
Patient called in states she has a UTI and she states that the hospital would like you to run her urine on Monday when she comes in for lab work.

## 2013-07-17 NOTE — Telephone Encounter (Signed)
If she is at the hospital, they can run today if there is concern.  If she is going to wait until Monday, she will need to be seen.  Unable to treat without seeing and also, I will not be in the office on Monday - so will need to be seen and evaluated so that she will get appropriate treatment.

## 2013-07-18 ENCOUNTER — Encounter: Payer: Self-pay | Admitting: Internal Medicine

## 2013-07-18 ENCOUNTER — Ambulatory Visit (INDEPENDENT_AMBULATORY_CARE_PROVIDER_SITE_OTHER): Payer: Medicare Other | Admitting: Internal Medicine

## 2013-07-18 ENCOUNTER — Telehealth: Payer: Self-pay | Admitting: *Deleted

## 2013-07-18 VITALS — BP 140/76 | HR 78 | Temp 97.8°F | Resp 16 | Wt 233.5 lb

## 2013-07-18 DIAGNOSIS — Z7901 Long term (current) use of anticoagulants: Secondary | ICD-10-CM | POA: Diagnosis not present

## 2013-07-18 DIAGNOSIS — F4323 Adjustment disorder with mixed anxiety and depressed mood: Secondary | ICD-10-CM | POA: Diagnosis not present

## 2013-07-18 DIAGNOSIS — N390 Urinary tract infection, site not specified: Secondary | ICD-10-CM

## 2013-07-18 DIAGNOSIS — I2699 Other pulmonary embolism without acute cor pulmonale: Secondary | ICD-10-CM

## 2013-07-18 DIAGNOSIS — R42 Dizziness and giddiness: Secondary | ICD-10-CM | POA: Diagnosis not present

## 2013-07-18 LAB — POCT URINALYSIS DIPSTICK
Bilirubin, UA: NEGATIVE
GLUCOSE UA: NEGATIVE
Ketones, UA: NEGATIVE
Nitrite, UA: NEGATIVE
PH UA: 6
Protein, UA: NEGATIVE
RBC UA: NEGATIVE
SPEC GRAV UA: 1.02
Urobilinogen, UA: 0.2

## 2013-07-18 LAB — CBC WITH DIFFERENTIAL/PLATELET
BASOS PCT: 0.6 % (ref 0.0–3.0)
Basophils Absolute: 0 10*3/uL (ref 0.0–0.1)
EOS PCT: 0.8 % (ref 0.0–5.0)
Eosinophils Absolute: 0.1 10*3/uL (ref 0.0–0.7)
HCT: 41.6 % (ref 36.0–46.0)
Hemoglobin: 13.9 g/dL (ref 12.0–15.0)
LYMPHS PCT: 22 % (ref 12.0–46.0)
Lymphs Abs: 1.6 10*3/uL (ref 0.7–4.0)
MCHC: 33.5 g/dL (ref 30.0–36.0)
MCV: 90.3 fl (ref 78.0–100.0)
Monocytes Absolute: 0.9 10*3/uL (ref 0.1–1.0)
Monocytes Relative: 12.5 % — ABNORMAL HIGH (ref 3.0–12.0)
NEUTROS PCT: 64.1 % (ref 43.0–77.0)
Neutro Abs: 4.6 10*3/uL (ref 1.4–7.7)
Platelets: 237 10*3/uL (ref 150.0–400.0)
RBC: 4.61 Mil/uL (ref 3.87–5.11)
RDW: 14.5 % (ref 11.5–15.5)
WBC: 7.1 10*3/uL (ref 4.0–10.5)

## 2013-07-18 LAB — COMPREHENSIVE METABOLIC PANEL
ALBUMIN: 3.8 g/dL (ref 3.5–5.2)
ALK PHOS: 33 U/L — AB (ref 39–117)
ALT: 21 U/L (ref 0–35)
AST: 28 U/L (ref 0–37)
BUN: 17 mg/dL (ref 6–23)
CALCIUM: 9.4 mg/dL (ref 8.4–10.5)
CO2: 26 mEq/L (ref 19–32)
Chloride: 102 mEq/L (ref 96–112)
Creatinine, Ser: 1.1 mg/dL (ref 0.4–1.2)
GFR: 50.89 mL/min — ABNORMAL LOW (ref >60.00)
Glucose, Bld: 110 mg/dL — ABNORMAL HIGH (ref 70–99)
POTASSIUM: 4.6 meq/L (ref 3.5–5.1)
Sodium: 137 mEq/L (ref 135–145)
Total Bilirubin: 0.6 mg/dL (ref 0.2–1.2)
Total Protein: 6.8 g/dL (ref 6.0–8.3)

## 2013-07-18 LAB — PROTIME-INR
INR: 2.6 ratio — ABNORMAL HIGH (ref 0.8–1.0)
PROTHROMBIN TIME: 28.1 s — AB (ref 9.6–13.1)

## 2013-07-18 MED ORDER — DIAZEPAM 5 MG PO TABS: 5.0000 mg | ORAL_TABLET | Freq: Two times a day (BID) | ORAL | Status: DC | PRN

## 2013-07-18 NOTE — Telephone Encounter (Signed)
Pt was seen in ED for UTI, started on antibiotic, symptoms are not improving and she is quite uncomfortable. Offered appt with Raquel this afternoon, stated she could not wait that long. Offered appt with Dr. Derrel Nip at 9:30, pt will be here then, faxed for ED notes

## 2013-07-18 NOTE — Patient Instructions (Signed)
I am prescribing you a medication to help you relax.  It is called valium, and is a mild sedative  Please rest today  Please call us back with the information regarding where you were seen  On Wednesday because the I need those records  Follow up with Dr Nicki Reaper next week

## 2013-07-18 NOTE — Telephone Encounter (Signed)
Pt is coming in on Monday what labs and dx?

## 2013-07-18 NOTE — Progress Notes (Signed)
[name], female   DOB: 02-25-1941, 73 y.o.   MRN: [id number]   Patient Active Problem List   Diagnosis Date Noted  . Adjustment disorder with mixed anxiety and depressed mood 07/20/2013  . UTI (urinary tract infection) 07/20/2013  . Chronic anticoagulation 04/11/2013  . Ecchymosis 04/11/2013  . Cough 04/10/2012  . Hypertension 01/01/2012  . Diabetes mellitus 01/01/2012  . Hypercholesteremia 01/01/2012  . Breast cancer 01/01/2012    Subjective:  CC:   Chief Complaint  Patient presents with  . Acute Visit    HPI:   Anita Walker is a 73 y.o. female who was worked in urgently for "feeling like I need to see the doctor." Nonspecific complaints, patient was seen on May 12 by her PCP Dr. Nicki Reaper and treated for a viral URI .  The following day visit she presented to [facility] ER with symptoms of generalized weakness, several day history of nausea without vomiting , decreased appetite and nervousness without any report of urinary frequency, back pain,  Or chest pain.   Was evaluated with an EKG, CBC, BMET, Troponin I and UA with micro.  Diagnosed with UTI/URI and given rx for levaquin. .  Told to have INR checked on Monday May 18th  Has had three doses of levaquin.   Felt better yesterday,  But today feels clammy ,  Weak and nervous.   Was treated for viral uri on may 12 by Dr Nicki Reaper with mucinex and nasal spray.   Husband died 2024/05/14unexpectedly.     Past Medical History  Diagnosis Date  . Hypertension   . Diabetes mellitus   . Breast cancer     s/p lumpectomy 1992.  s/p chemo and xrt  . Hypercholesterolemia   . Atrial fibrillation   . Anemia   . GERD (gastroesophageal reflux disease)   . Pulmonary emboli   . Gastric ulcer     Past Surgical History  Procedure Laterality Date  . Breast lumpectomy      left breast       The following portions of the patient's history were reviewed and updated as appropriate: Allergies, current medications, and problem  list.    Review of Systems:   Patient denies headache, fevers, malaise, unintentional weight loss, skin rash, eye pain, sinus congestion and sinus pain, sore throat, dysphagia,  hemoptysis , cough, dyspnea, wheezing, chest pain, palpitations, orthopnea, edema, abdominal pain, nausea, melena, diarrhea, constipation, flank pain, dysuria, hematuria, urinary  Frequency, nocturia, numbness, tingling, seizures,  Focal weakness, Loss of consciousness,  Tremor, insomnia, depression, anxiety, and suicidal ideation.     History   Social History  . Marital Status: Widowed    Spouse Name: N/A    Number of Children: N/A  . Years of Education: N/A   Occupational History  . Not on file.   Social History Main Topics  . Smoking status: Former Research scientist (life sciences)  . Smokeless tobacco: Never Used  . Alcohol Use: No  . Drug Use: No  . Sexual Activity: Not on file   Other Topics Concern  . Not on file   Social History Narrative  . No narrative on file    Objective:  Filed Vitals:   07/18/13 0947  BP: 140/76  Pulse: 78  Temp: 97.8 F (36.6 C)  Resp: 16     General appearance: alert, cooperative and appears stated age Ears: normal TM's and external ear canals both ears Throat: lips, mucosa, and tongue normal; teeth and gums normal Neck: no  adenopathy, no carotid bruit, supple, symmetrical, trachea midline and thyroid not enlarged, symmetric, no tenderness/mass/nodules Back: symmetric, no curvature. ROM normal. No CVA tenderness. Lungs: clear to auscultation bilaterally Heart: regular rate and rhythm, S1, S2 normal, no murmur, click, rub or gallop Abdomen: soft, non-tender; bowel sounds normal; no masses,  no organomegaly Pulses: 2+ and symmetric Skin: Skin color, texture, turgor normal. No rashes or lesions Lymph nodes: Cervical, supraclavicular, and axillary nodes normal.  Assessment and Plan:  Adjustment disorder with mixed anxiety and depressed mood Suspected by current nonspecific  symptoms of nausea, weakness, lack of appetite.  triggered by loss of husband.  Trial of valium.   UTI (urinary tract infection) Review of Er records confirm that she was treated for UTI based on abnormal UA with pyuria (64 WBCs) on micro.  Advised to continue Levaquin.  INR was checked today after 3 days of levaquin and was therapeutic.   A total of 40 minutes was spent with patient more than half of which was spent in counseling, reviewing records from other prviders and coordination of care.  Updated Medication List Outpatient Encounter Prescriptions as of 07/18/2013  Medication Sig  . azithromycin (ZITHROMAX) 250 MG tablet Take 2 pills x 1 day and then one pill per day for four more days.  . cetirizine (ZYRTEC) 10 MG tablet Take 10 mg by mouth daily.  Marland Kitchen Dextromethorphan-Guaifenesin (MUCINEX DM MAXIMUM STRENGTH) 60-1200 MG TB12 Take 1 tablet by mouth 2 (two) times daily.  . ferrous sulfate 325 (65 FE) MG tablet Take 325 mg by mouth daily with breakfast.  . fluticasone (FLONASE) 50 MCG/ACT nasal spray Place 2 sprays into both nostrils daily.  Marland Kitchen levofloxacin (LEVAQUIN) 500 MG tablet Take 500 mg by mouth daily.  Marland Kitchen lisinopril (PRINIVIL,ZESTRIL) 20 MG tablet TAKE ONE (1) TABLET BY MOUTH EVERY DAY  . ondansetron (ZOFRAN-ODT) 4 MG disintegrating tablet Take 4 mg by mouth every 8 (eight) hours as needed for nausea or vomiting.  . pantoprazole (PROTONIX) 40 MG tablet Take 40 mg by mouth daily.  . simvastatin (ZOCOR) 10 MG tablet TAKE ONE (1) TABLET BY MOUTH EVERY DAY  . warfarin (COUMADIN) 2.5 MG tablet TAKE 2 TABLETS (5MG) ON MONDAY , WEDNESDAY AND FRIDAY AND TAKE 1 TABLET (2.5MG) ON ALL OTHER DAYS  . diazepam (VALIUM) 5 MG tablet Take 1 tablet (5 mg total) by mouth every 12 (twelve) hours as needed for anxiety.     Orders Placed This Encounter  Procedures  . CULTURE, URINE COMPREHENSIVE  . Comp Met (CMET)  . CBC with Differential  . INR/PT  . POCT urinalysis dipstick    No Follow-up on  file.

## 2013-07-18 NOTE — Progress Notes (Signed)
Pre-visit discussion using our clinic review tool. No additional management support is needed unless otherwise documented below in the visit note.  

## 2013-07-20 DIAGNOSIS — F4329 Adjustment disorder with other symptoms: Secondary | ICD-10-CM | POA: Insufficient documentation

## 2013-07-20 DIAGNOSIS — N390 Urinary tract infection, site not specified: Secondary | ICD-10-CM | POA: Insufficient documentation

## 2013-07-20 LAB — CULTURE, URINE COMPREHENSIVE
COLONY COUNT: NO GROWTH
Organism ID, Bacteria: NO GROWTH

## 2013-07-20 NOTE — Assessment & Plan Note (Signed)
Review of Er records confirm that she was treated for UTI based on abnormal UA with pyuria (64 WBCs) on micro.  Advised to continue Levaquin.  INR was checked today after 3 days of levaquin and was therapeutic.

## 2013-07-20 NOTE — Assessment & Plan Note (Addendum)
Suspected by current nonspecific symptoms of nausea, weakness, lack of appetite.  triggered by loss of husband.  Trial of valium.

## 2013-07-21 ENCOUNTER — Other Ambulatory Visit: Payer: Medicare Other

## 2013-07-21 ENCOUNTER — Encounter: Payer: Self-pay | Admitting: Internal Medicine

## 2013-07-21 NOTE — Assessment & Plan Note (Signed)
Adjust coumadin as directed.  Follow.

## 2013-07-21 NOTE — Assessment & Plan Note (Signed)
Persistent cough, congestion and productive mucus.  Some minimal sinus pressure.  Probable URI.  Treat with mucinex DM in the am and Robitussin DM in the evening.  Saline nasal spray and Flonase nasal spray as directed.   Rx given for zpak to hang on to if symptoms worsen.  Follow.

## 2013-07-21 NOTE — Assessment & Plan Note (Signed)
Blood pressure under good control.  Follow metabolic panel.  Same medication regimen.   

## 2013-07-21 NOTE — Telephone Encounter (Signed)
Order placed for labs.

## 2013-07-21 NOTE — Progress Notes (Signed)
  Subjective:    [name], female    DOB: 1940/10/27, 73 y.o.   MRN: [id number]  URI   73 year old female with past history of hypertension, diabetes, hypercholesterolemia and breast cancer s/p left breast mastectomy who comes in today as a work in with concerns regarding increased cough, congestion and sinus pressure.  She states she has been doing well.   No chest pain or tightness.  Breathing stable.  States symptoms started last week.  Minimal sinus pressure.  Increased cough productive of yellow mucus.  Minimal drainage.  Sore throat - better.  No sob.  No vomiting.  No acid reflux or fever.  Has taken alka selter cold.     Past Medical History  Diagnosis Date  . Hypertension   . Diabetes mellitus   . Breast cancer     s/p lumpectomy 1992.  s/p chemo and xrt  . Hypercholesterolemia   . Atrial fibrillation   . Anemia   . GERD (gastroesophageal reflux disease)   . Pulmonary emboli   . Gastric ulcer     Current Outpatient Prescriptions on File Prior to Visit  Medication Sig Dispense Refill  . ferrous sulfate 325 (65 FE) MG tablet Take 325 mg by mouth daily with breakfast.      . lisinopril (PRINIVIL,ZESTRIL) 20 MG tablet TAKE ONE (1) TABLET BY MOUTH EVERY DAY  90 tablet  1  . pantoprazole (PROTONIX) 40 MG tablet Take 40 mg by mouth daily.      . simvastatin (ZOCOR) 10 MG tablet TAKE ONE (1) TABLET BY MOUTH EVERY DAY  90 tablet  1  . warfarin (COUMADIN) 2.5 MG tablet TAKE 2 TABLETS (5MG ) ON MONDAY , WEDNESDAY AND FRIDAY AND TAKE 1 TABLET (2.5MG ) ON ALL OTHER DAYS  120 tablet  1  . cetirizine (ZYRTEC) 10 MG tablet Take 10 mg by mouth daily.       No current facility-administered medications on file prior to visit.    Review of Systems Patient denies any headache, lightheadedness or dizziness.  Minimal drainage.  Sore throat is better.   No chest pain, tightness or palpitations.  No increased shortness of breath.  Still with cough and congestion.  Productive of yellow  mucus. No nausea or vomiting.  No acid reflux.  No abdominal pain or cramping.  No bowel change, such as diarrhea, constipation, BRBPR or melana.  No urine change.       Objective:   Physical Exam  Filed Vitals:   07/15/13 1054  BP: 130/80  Pulse: 78  Temp: 98.1 F (19.49 C)   73 year old female in no acute distress.   HEENT:  Nares- clear, slightly erythematous turbinates.  Oropharynx - without lesions.  Minimal sinus tenderness to palpation.   NECK:  Supple.  Nontender.  No audible bruit.  HEART:  Appears to be regular. LUNGS:  No crackles or wheezing audible.  Respirations even and unlabored.  RADIAL PULSE:  Equal bilaterally.           Assessment & Plan:  HEALTH MAINTENANCE.  Physical last visit.   Had mammogram in 10/23/12 - ok.

## 2013-07-23 ENCOUNTER — Encounter: Payer: Self-pay | Admitting: Internal Medicine

## 2013-07-23 ENCOUNTER — Ambulatory Visit (INDEPENDENT_AMBULATORY_CARE_PROVIDER_SITE_OTHER): Payer: Medicare Other | Admitting: Internal Medicine

## 2013-07-23 VITALS — BP 110/70 | HR 89 | Temp 98.3°F | Ht 67.5 in | Wt 231.8 lb

## 2013-07-23 DIAGNOSIS — F4323 Adjustment disorder with mixed anxiety and depressed mood: Secondary | ICD-10-CM

## 2013-07-23 DIAGNOSIS — R059 Cough, unspecified: Secondary | ICD-10-CM | POA: Diagnosis not present

## 2013-07-23 DIAGNOSIS — C50919 Malignant neoplasm of unspecified site of unspecified female breast: Secondary | ICD-10-CM

## 2013-07-23 DIAGNOSIS — I10 Essential (primary) hypertension: Secondary | ICD-10-CM

## 2013-07-23 DIAGNOSIS — R05 Cough: Secondary | ICD-10-CM | POA: Diagnosis not present

## 2013-07-23 DIAGNOSIS — E119 Type 2 diabetes mellitus without complications: Secondary | ICD-10-CM

## 2013-07-23 DIAGNOSIS — N390 Urinary tract infection, site not specified: Secondary | ICD-10-CM

## 2013-07-23 NOTE — Progress Notes (Signed)
Pre visit review using our clinic review tool, if applicable. No additional management support is needed unless otherwise documented below in the visit note. 

## 2013-07-24 ENCOUNTER — Ambulatory Visit: Payer: Medicare Other | Admitting: Internal Medicine

## 2013-07-28 ENCOUNTER — Encounter: Payer: Self-pay | Admitting: Internal Medicine

## 2013-07-28 NOTE — Assessment & Plan Note (Signed)
Dealing with the recent death of her husband.  On valium.  Prescribed by Dr Derrel Nip.  Doing well with this medication.  Feels better.  Desires no further medication now.  Follow closely.

## 2013-07-28 NOTE — Progress Notes (Signed)
Subjective:    [name], female    DOB: Aug 19, 1940, 73 y.o.   MRN: [id number]  HPI 73 year old female with past history of hypertension, diabetes, hypercholesterolemia and breast cancer s/p left breast mastectomy who comes in today for a scheduled follow up.  Went to the ER on 07/16/13.  Was having nausea and just did not feel well.  Was diagnosed with UTI.  Placed on levaquin.  I had seen her just prior to ER visit and she was treated for URI.  URI symptoms have resolved.  She saw Dr Derrel Nip in follow up after ER visit.  See her note for details.  She comes in today as a f/u from Dr Lupita Dawn visit.  She states she feels better.  Nausea has resolved.  Eating and drinking.  No urinary symptoms.   No chest pain or tightness.  Breathing stable.  No sob.  No bowel change.  Some increased stress and some reactive depression in dealing with the recent death of her husband.  On valium.  This is helping to calm her down.  She feels better.  Does not feel she needs anything more.  No suicidal ideations.  Has good support.     Past Medical History  Diagnosis Date  . Hypertension   . Diabetes mellitus   . Breast cancer     s/p lumpectomy 1992.  s/p chemo and xrt  . Hypercholesterolemia   . Atrial fibrillation   . Anemia   . GERD (gastroesophageal reflux disease)   . Pulmonary emboli   . Gastric ulcer     Current Outpatient Prescriptions on File Prior to Visit  Medication Sig Dispense Refill  . cetirizine (ZYRTEC) 10 MG tablet Take 10 mg by mouth daily.      . diazepam (VALIUM) 5 MG tablet Take 1 tablet (5 mg total) by mouth every 12 (twelve) hours as needed for anxiety.  30 tablet  1  . ferrous sulfate 325 (65 FE) MG tablet Take 325 mg by mouth daily with breakfast.      . fluticasone (FLONASE) 50 MCG/ACT nasal spray Place 2 sprays into both nostrils daily.  16 g  1  . lisinopril (PRINIVIL,ZESTRIL) 20 MG tablet TAKE ONE (1) TABLET BY MOUTH EVERY DAY  90 tablet  1  . ondansetron  (ZOFRAN-ODT) 4 MG disintegrating tablet Take 4 mg by mouth every 8 (eight) hours as needed for nausea or vomiting.      . pantoprazole (PROTONIX) 40 MG tablet Take 40 mg by mouth daily.      . simvastatin (ZOCOR) 10 MG tablet TAKE ONE (1) TABLET BY MOUTH EVERY DAY  90 tablet  1  . warfarin (COUMADIN) 2.5 MG tablet TAKE 2 TABLETS (5MG ) ON MONDAY , WEDNESDAY AND FRIDAY AND TAKE 1 TABLET (2.5MG ) ON ALL OTHER DAYS  120 tablet  1   No current facility-administered medications on file prior to visit.    Review of Systems Patient denies any headache, lightheadedness or dizziness.  No sinus or allergy problems.  No chest pain, tightness or palpitations.  No increased shortness of breath, cough or congestion. Cough and congestion resolved.   No nausea or vomiting.  No abdominal pain or cramping.  No bowel change, such as diarrhea, constipation, BRBPR or melana.  No urine change.  Increased stress and depression in dealing with her husbands death.       Objective:   Physical Exam  Filed Vitals:   07/23/13 1106  BP: 110/70  Pulse: 89  Temp: 98.3 F (36.8 C)   Blood pressure recheck:  74/49  73 year old female in no acute distress.   HEENT:  Nares- clear.  Oropharynx - without lesions. NECK:  Supple.  Nontender.  No audible bruit.  HEART:  Appears to be regular. LUNGS:  No crackles or wheezing audible.  Respirations even and unlabored.  RADIAL PULSE:  Equal bilaterally.  ABDOMEN:  Soft, nontender.  Bowel sounds present and normal.  No audible abdominal bruit.    EXTREMITIES:  No increased edema present.  DP pulses palpable and equal bilaterally.          Assessment & Plan:  HEALTH MAINTENANCE.  Physical 05/09/13.   Had mammogram in 8/14.    I spent 25 minutes with the patient and more than 50% of the time was spent in consultation regarding the above.

## 2013-07-28 NOTE — Assessment & Plan Note (Signed)
Blood pressure under good control.  Follow metabolic panel.  Same medication regimen.   

## 2013-07-28 NOTE — Assessment & Plan Note (Signed)
Resolved

## 2013-07-28 NOTE — Assessment & Plan Note (Signed)
Had mammogram 8/14.  Negative.

## 2013-07-28 NOTE — Assessment & Plan Note (Signed)
Currently asymptomatic.  Treated with levaquin.  Follow.

## 2013-07-28 NOTE — Assessment & Plan Note (Signed)
Low carb diet.  Follow metabolic panel and a1c.  Up to date with eye checks.    

## 2013-08-20 ENCOUNTER — Other Ambulatory Visit (INDEPENDENT_AMBULATORY_CARE_PROVIDER_SITE_OTHER): Payer: Medicare Other

## 2013-08-20 DIAGNOSIS — I2699 Other pulmonary embolism without acute cor pulmonale: Secondary | ICD-10-CM | POA: Diagnosis not present

## 2013-08-20 LAB — PROTIME-INR
INR: 2 ratio — ABNORMAL HIGH (ref 0.8–1.0)
Prothrombin Time: 21.4 s — ABNORMAL HIGH (ref 9.6–13.1)

## 2013-08-27 ENCOUNTER — Ambulatory Visit (INDEPENDENT_AMBULATORY_CARE_PROVIDER_SITE_OTHER): Payer: Medicare Other | Admitting: Podiatry

## 2013-08-27 VITALS — BP 105/72 | HR 85 | Resp 16

## 2013-08-27 DIAGNOSIS — M79609 Pain in unspecified limb: Secondary | ICD-10-CM | POA: Diagnosis not present

## 2013-08-27 DIAGNOSIS — M79676 Pain in unspecified toe(s): Secondary | ICD-10-CM

## 2013-08-27 DIAGNOSIS — E119 Type 2 diabetes mellitus without complications: Secondary | ICD-10-CM

## 2013-08-27 DIAGNOSIS — B351 Tinea unguium: Secondary | ICD-10-CM

## 2013-08-27 NOTE — Progress Notes (Signed)
She presents today for a chief complaint of painful elongated toenails.  Objective: Nails are thick yellow dystrophic onychomycotic and painful palpation.  Assessment: Pain in limb secondary to onychomycosis 1 through 5 bilateral.  Plan: Debridement nails thickness and length cover service secondary to pain.

## 2013-09-03 ENCOUNTER — Other Ambulatory Visit (INDEPENDENT_AMBULATORY_CARE_PROVIDER_SITE_OTHER): Payer: Medicare Other

## 2013-09-03 DIAGNOSIS — Z7901 Long term (current) use of anticoagulants: Secondary | ICD-10-CM

## 2013-09-03 DIAGNOSIS — Z5181 Encounter for therapeutic drug level monitoring: Secondary | ICD-10-CM

## 2013-09-03 LAB — PROTIME-INR
INR: 2 ratio — ABNORMAL HIGH (ref 0.8–1.0)
Prothrombin Time: 22.1 s — ABNORMAL HIGH (ref 9.6–13.1)

## 2013-09-10 ENCOUNTER — Ambulatory Visit (INDEPENDENT_AMBULATORY_CARE_PROVIDER_SITE_OTHER): Payer: Medicare Other | Admitting: Internal Medicine

## 2013-09-10 ENCOUNTER — Encounter: Payer: Self-pay | Admitting: Internal Medicine

## 2013-09-10 VITALS — BP 120/70 | HR 71 | Temp 97.8°F | Ht 67.5 in | Wt 234.5 lb

## 2013-09-10 DIAGNOSIS — Z1239 Encounter for other screening for malignant neoplasm of breast: Secondary | ICD-10-CM

## 2013-09-10 DIAGNOSIS — E119 Type 2 diabetes mellitus without complications: Secondary | ICD-10-CM | POA: Diagnosis not present

## 2013-09-10 DIAGNOSIS — E7800 Pure hypercholesterolemia, unspecified: Secondary | ICD-10-CM

## 2013-09-10 DIAGNOSIS — C50919 Malignant neoplasm of unspecified site of unspecified female breast: Secondary | ICD-10-CM

## 2013-09-10 DIAGNOSIS — I10 Essential (primary) hypertension: Secondary | ICD-10-CM

## 2013-09-10 NOTE — Progress Notes (Signed)
Pre visit review using our clinic review tool, if applicable. No additional management support is needed unless otherwise documented below in the visit note. 

## 2013-09-14 ENCOUNTER — Encounter: Payer: Self-pay | Admitting: Internal Medicine

## 2013-09-14 NOTE — Assessment & Plan Note (Signed)
Low carb diet.  Follow metabolic panel and U4W.  Up to date with eye checks.

## 2013-09-14 NOTE — Assessment & Plan Note (Signed)
Had mammogram 8/14.  Negative.  Schedule f/u mammogram.

## 2013-09-14 NOTE — Assessment & Plan Note (Signed)
Blood pressure under good control.  Follow metabolic panel.  Same medication regimen.   

## 2013-09-14 NOTE — Assessment & Plan Note (Signed)
Low cholesterol diet.  Continue simvastatin.  Follow lipid panel and liver function.

## 2013-09-14 NOTE — Progress Notes (Signed)
  Subjective:    [name], female    DOB: [birth year], 73 y.o.   MRN: [id number]  HPI 73 year old female with past history of hypertension, diabetes, hypercholesterolemia and breast cancer s/p left breast mastectomy who comes in today for a scheduled follow up.   Trying to cope with her husbands death.  States overall she is doing relatively well.  Eating and drinking well.   No urinary symptoms.   No chest pain or tightness.  Breathing stable.  No sob.  No bowel change.  Some increased stress and some reactive depression, but overall she feels she is ok.  Does not feel she needs anything more.  Has good support.     Past Medical History  Diagnosis Date  . Hypertension   . Diabetes mellitus   . Breast cancer     s/p lumpectomy 1992.  s/p chemo and xrt  . Hypercholesterolemia   . Atrial fibrillation   . Anemia   . GERD (gastroesophageal reflux disease)   . Pulmonary emboli   . Gastric ulcer     Current Outpatient Prescriptions on File Prior to Visit  Medication Sig Dispense Refill  . ferrous sulfate 325 (65 FE) MG tablet Take 325 mg by mouth daily with breakfast.      . fluticasone (FLONASE) 50 MCG/ACT nasal spray Place 2 sprays into both nostrils daily.  16 g  1  . lisinopril (PRINIVIL,ZESTRIL) 20 MG tablet TAKE ONE (1) TABLET BY MOUTH EVERY DAY  90 tablet  1  . pantoprazole (PROTONIX) 40 MG tablet Take 40 mg by mouth daily.      . simvastatin (ZOCOR) 10 MG tablet TAKE ONE (1) TABLET BY MOUTH EVERY DAY  90 tablet  1  . warfarin (COUMADIN) 2.5 MG tablet TAKE 2 TABLETS (5MG ) ON MONDAY , WEDNESDAY AND FRIDAY AND TAKE 1 TABLET (2.5MG ) ON ALL OTHER DAYS  120 tablet  1   No current facility-administered medications on file prior to visit.    Review of Systems Patient denies any headache, lightheadedness or dizziness.  No sinus or allergy problems.  No chest pain, tightness or palpitations.  No increased shortness of breath or significant cough or congestion.  No nausea or  vomiting.  No abdominal pain or cramping.  No bowel change, such as diarrhea, constipation, BRBPR or melana.  No urine change.  Increased stress and depression in dealing with her husbands death.  Overall she feels she is coping relatively well.  Is scheduled to have a dermatology appt for body check.       Objective:   Physical Exam  Filed Vitals:   09/10/13 0934  BP: 120/70  Pulse: 71  Temp: 97.8 F (90.49 C)   73 year old female in no acute distress.   HEENT:  Nares- clear.  Oropharynx - without lesions. NECK:  Supple.  Nontender.  No audible bruit.  HEART:  Appears to be regular. LUNGS:  No crackles or wheezing audible.  Respirations even and unlabored.  RADIAL PULSE:  Equal bilaterally.  ABDOMEN:  Soft, nontender.  Bowel sounds present and normal.  No audible abdominal bruit.    EXTREMITIES:  No increased edema present.  DP pulses palpable and equal bilaterally.          Assessment & Plan:  HEALTH MAINTENANCE.  Physical 05/09/13.   Had mammogram in 8/14.  Schedule f/u mammogram.

## 2013-09-29 ENCOUNTER — Other Ambulatory Visit: Payer: Self-pay | Admitting: Internal Medicine

## 2013-10-01 ENCOUNTER — Telehealth: Payer: Self-pay | Admitting: *Deleted

## 2013-10-01 ENCOUNTER — Other Ambulatory Visit (INDEPENDENT_AMBULATORY_CARE_PROVIDER_SITE_OTHER): Payer: Medicare Other

## 2013-10-01 DIAGNOSIS — I2699 Other pulmonary embolism without acute cor pulmonale: Secondary | ICD-10-CM | POA: Diagnosis not present

## 2013-10-01 DIAGNOSIS — I10 Essential (primary) hypertension: Secondary | ICD-10-CM | POA: Diagnosis not present

## 2013-10-01 DIAGNOSIS — E7800 Pure hypercholesterolemia, unspecified: Secondary | ICD-10-CM | POA: Diagnosis not present

## 2013-10-01 DIAGNOSIS — E119 Type 2 diabetes mellitus without complications: Secondary | ICD-10-CM | POA: Diagnosis not present

## 2013-10-01 DIAGNOSIS — Z85828 Personal history of other malignant neoplasm of skin: Secondary | ICD-10-CM | POA: Diagnosis not present

## 2013-10-01 DIAGNOSIS — D485 Neoplasm of uncertain behavior of skin: Secondary | ICD-10-CM | POA: Diagnosis not present

## 2013-10-01 DIAGNOSIS — L821 Other seborrheic keratosis: Secondary | ICD-10-CM | POA: Diagnosis not present

## 2013-10-01 DIAGNOSIS — D235 Other benign neoplasm of skin of trunk: Secondary | ICD-10-CM | POA: Diagnosis not present

## 2013-10-01 LAB — LIPID PANEL
Cholesterol: 143 mg/dL (ref 0–200)
HDL: 45.9 mg/dL (ref >39.00)
LDL Cholesterol: 75 mg/dL (ref 0–99)
NONHDL: 97.1
Total CHOL/HDL Ratio: 3
Triglycerides: 111 mg/dL (ref 0.0–149.0)
VLDL: 22.2 mg/dL (ref 0.0–40.0)

## 2013-10-01 LAB — HEMOGLOBIN A1C: HEMOGLOBIN A1C: 6 % (ref 4.6–6.5)

## 2013-10-01 LAB — BASIC METABOLIC PANEL
BUN: 16 mg/dL (ref 6–23)
CHLORIDE: 104 meq/L (ref 96–112)
CO2: 27 meq/L (ref 19–32)
CREATININE: 0.9 mg/dL (ref 0.4–1.2)
Calcium: 9.6 mg/dL (ref 8.4–10.5)
GFR: 63.96 mL/min (ref >60.00)
Glucose, Bld: 110 mg/dL — ABNORMAL HIGH (ref 70–99)
POTASSIUM: 4.4 meq/L (ref 3.5–5.1)
Sodium: 136 mEq/L (ref 135–145)

## 2013-10-01 LAB — HEPATIC FUNCTION PANEL
ALBUMIN: 3.8 g/dL (ref 3.5–5.2)
ALT: 16 U/L (ref 0–35)
AST: 23 U/L (ref 0–37)
Alkaline Phosphatase: 33 U/L — ABNORMAL LOW (ref 39–117)
BILIRUBIN DIRECT: 0.1 mg/dL (ref 0.0–0.3)
TOTAL PROTEIN: 6.6 g/dL (ref 6.0–8.3)
Total Bilirubin: 0.9 mg/dL (ref 0.2–1.2)

## 2013-10-01 LAB — PROTIME-INR
INR: 1.8 ratio — AB (ref 0.8–1.0)
PROTHROMBIN TIME: 19.5 s — AB (ref 9.6–13.1)

## 2013-10-01 NOTE — Addendum Note (Signed)
Addended by: Karlene Einstein D on: 10/01/2013 03:38 PM   Modules accepted: Orders

## 2013-10-01 NOTE — Telephone Encounter (Signed)
Thank you!  I placed the order for pt/inr.

## 2013-10-01 NOTE — Telephone Encounter (Signed)
i collected a blue top (pt.inr) just in case it was not ordered

## 2013-10-02 ENCOUNTER — Other Ambulatory Visit: Payer: Self-pay | Admitting: Internal Medicine

## 2013-10-02 DIAGNOSIS — I2699 Other pulmonary embolism without acute cor pulmonale: Secondary | ICD-10-CM

## 2013-10-02 NOTE — Progress Notes (Signed)
Order placed for pt/inr 

## 2013-10-09 ENCOUNTER — Other Ambulatory Visit (INDEPENDENT_AMBULATORY_CARE_PROVIDER_SITE_OTHER): Payer: Medicare Other

## 2013-10-09 DIAGNOSIS — I2699 Other pulmonary embolism without acute cor pulmonale: Secondary | ICD-10-CM

## 2013-10-09 LAB — PROTIME-INR
INR: 1.7 ratio — AB (ref 0.8–1.0)
Prothrombin Time: 18.1 s — ABNORMAL HIGH (ref 9.6–13.1)

## 2013-10-21 ENCOUNTER — Other Ambulatory Visit: Payer: Self-pay | Admitting: *Deleted

## 2013-10-21 DIAGNOSIS — Z7901 Long term (current) use of anticoagulants: Secondary | ICD-10-CM

## 2013-10-22 ENCOUNTER — Other Ambulatory Visit (INDEPENDENT_AMBULATORY_CARE_PROVIDER_SITE_OTHER): Payer: Medicare Other

## 2013-10-22 DIAGNOSIS — Z7901 Long term (current) use of anticoagulants: Secondary | ICD-10-CM

## 2013-10-22 LAB — PROTIME-INR
INR: 1.9 ratio — ABNORMAL HIGH (ref 0.8–1.0)
Prothrombin Time: 20.7 s — ABNORMAL HIGH (ref 9.6–13.1)

## 2013-10-28 ENCOUNTER — Ambulatory Visit: Payer: Self-pay | Admitting: Internal Medicine

## 2013-10-28 DIAGNOSIS — Z1231 Encounter for screening mammogram for malignant neoplasm of breast: Secondary | ICD-10-CM | POA: Diagnosis not present

## 2013-10-28 DIAGNOSIS — Z853 Personal history of malignant neoplasm of breast: Secondary | ICD-10-CM | POA: Diagnosis not present

## 2013-10-31 ENCOUNTER — Encounter: Payer: Self-pay | Admitting: Internal Medicine

## 2013-11-03 ENCOUNTER — Other Ambulatory Visit (INDEPENDENT_AMBULATORY_CARE_PROVIDER_SITE_OTHER): Payer: Medicare Other

## 2013-11-03 DIAGNOSIS — Z7901 Long term (current) use of anticoagulants: Secondary | ICD-10-CM | POA: Diagnosis not present

## 2013-11-03 LAB — PROTIME-INR
INR: 2 ratio — ABNORMAL HIGH (ref 0.8–1.0)
PROTHROMBIN TIME: 22.2 s — AB (ref 9.6–13.1)

## 2013-11-04 ENCOUNTER — Other Ambulatory Visit: Payer: Self-pay | Admitting: Internal Medicine

## 2013-11-18 ENCOUNTER — Other Ambulatory Visit (INDEPENDENT_AMBULATORY_CARE_PROVIDER_SITE_OTHER): Payer: Medicare Other

## 2013-11-18 DIAGNOSIS — Z7901 Long term (current) use of anticoagulants: Secondary | ICD-10-CM | POA: Diagnosis not present

## 2013-11-18 LAB — PROTIME-INR
INR: 1.9 ratio — AB (ref 0.8–1.0)
Prothrombin Time: 20.7 s — ABNORMAL HIGH (ref 9.6–13.1)

## 2013-11-26 ENCOUNTER — Ambulatory Visit (INDEPENDENT_AMBULATORY_CARE_PROVIDER_SITE_OTHER): Payer: Medicare Other | Admitting: Podiatry

## 2013-11-26 DIAGNOSIS — M79609 Pain in unspecified limb: Secondary | ICD-10-CM

## 2013-11-26 DIAGNOSIS — M79676 Pain in unspecified toe(s): Secondary | ICD-10-CM

## 2013-11-26 DIAGNOSIS — B351 Tinea unguium: Secondary | ICD-10-CM | POA: Diagnosis not present

## 2013-11-26 DIAGNOSIS — E119 Type 2 diabetes mellitus without complications: Secondary | ICD-10-CM

## 2013-11-27 NOTE — Progress Notes (Signed)
She presents today with a chief complaint of painful elongated toenails one through 5 bilateral.  Objective: Nails are thick yellow dystrophic onychomycotic and painful palpation.  Assessment: Pain in limb secondary to onychomycosis 1 through 5 bilateral.  Plan: Debridement of nails 1 through 5 bilateral covered service secondary to pain. 

## 2013-12-01 DIAGNOSIS — H2510 Age-related nuclear cataract, unspecified eye: Secondary | ICD-10-CM | POA: Diagnosis not present

## 2013-12-03 ENCOUNTER — Other Ambulatory Visit (INDEPENDENT_AMBULATORY_CARE_PROVIDER_SITE_OTHER): Payer: Medicare Other

## 2013-12-03 DIAGNOSIS — Z23 Encounter for immunization: Secondary | ICD-10-CM

## 2013-12-03 DIAGNOSIS — Z7901 Long term (current) use of anticoagulants: Secondary | ICD-10-CM | POA: Diagnosis not present

## 2013-12-03 LAB — PROTIME-INR
INR: 2 ratio — AB (ref 0.8–1.0)
Prothrombin Time: 21.4 s — ABNORMAL HIGH (ref 9.6–13.1)

## 2013-12-18 ENCOUNTER — Other Ambulatory Visit: Payer: Self-pay | Admitting: Internal Medicine

## 2014-01-01 ENCOUNTER — Other Ambulatory Visit (INDEPENDENT_AMBULATORY_CARE_PROVIDER_SITE_OTHER): Payer: Medicare Other

## 2014-01-01 DIAGNOSIS — Z7901 Long term (current) use of anticoagulants: Secondary | ICD-10-CM | POA: Diagnosis not present

## 2014-01-01 LAB — PROTIME-INR
INR: 2.1 ratio — ABNORMAL HIGH (ref 0.8–1.0)
Prothrombin Time: 22.8 s — ABNORMAL HIGH (ref 9.6–13.1)

## 2014-01-14 ENCOUNTER — Ambulatory Visit (INDEPENDENT_AMBULATORY_CARE_PROVIDER_SITE_OTHER): Payer: Medicare Other | Admitting: Internal Medicine

## 2014-01-14 ENCOUNTER — Encounter: Payer: Self-pay | Admitting: Internal Medicine

## 2014-01-14 VITALS — BP 139/77 | HR 77 | Temp 97.6°F | Ht 67.5 in | Wt 239.5 lb

## 2014-01-14 DIAGNOSIS — I10 Essential (primary) hypertension: Secondary | ICD-10-CM

## 2014-01-14 DIAGNOSIS — Z7901 Long term (current) use of anticoagulants: Secondary | ICD-10-CM | POA: Diagnosis not present

## 2014-01-14 DIAGNOSIS — E7800 Pure hypercholesterolemia, unspecified: Secondary | ICD-10-CM

## 2014-01-14 DIAGNOSIS — E119 Type 2 diabetes mellitus without complications: Secondary | ICD-10-CM

## 2014-01-14 DIAGNOSIS — Z23 Encounter for immunization: Secondary | ICD-10-CM | POA: Diagnosis not present

## 2014-01-14 DIAGNOSIS — C50919 Malignant neoplasm of unspecified site of unspecified female breast: Secondary | ICD-10-CM | POA: Diagnosis not present

## 2014-01-14 DIAGNOSIS — F4323 Adjustment disorder with mixed anxiety and depressed mood: Secondary | ICD-10-CM

## 2014-01-14 NOTE — Progress Notes (Signed)
Subjective:    [name], female    DOB: [birth year], 73 y.o.   MRN: [id number]  HPI 73 year old female with past history of hypertension, diabetes, hypercholesterolemia and breast cancer s/p left breast mastectomy who comes in today for a scheduled follow up.   Trying to cope with her husbands death.  States overall she is doing relatively well. Desires no further intervention.   Eating and drinking well.   No chest pain or tightness.  Breathing stable.  No sob.  No bowel change.   Has good support.     Past Medical History  Diagnosis Date  . Hypertension   . Diabetes mellitus   . Breast cancer     s/p lumpectomy 1992.  s/p chemo and xrt  . Hypercholesterolemia   . Atrial fibrillation   . Anemia   . GERD (gastroesophageal reflux disease)   . Pulmonary emboli   . Gastric ulcer     Current Outpatient Prescriptions on File Prior to Visit  Medication Sig Dispense Refill  . ferrous sulfate 325 (65 FE) MG tablet Take 325 mg by mouth daily with breakfast.    . fluticasone (FLONASE) 50 MCG/ACT nasal spray Place 2 sprays into both nostrils daily. 16 g 1  . lisinopril (PRINIVIL,ZESTRIL) 20 MG tablet TAKE ONE (1) TABLET EACH DAY 90 tablet 1  . pantoprazole (PROTONIX) 40 MG tablet Take 40 mg by mouth daily.    . simvastatin (ZOCOR) 10 MG tablet TAKE ONE (1) TABLET EACH DAY 90 tablet 2  . warfarin (COUMADIN) 2.5 MG tablet TAKE 2 TABLETS (5MG ) ON MONDAY , WEDNESDAY AND FRIDAY AND TAKE 1 TABLET (2.5MG ) ON ALL OTHER DAYS 120 tablet 1   No current facility-administered medications on file prior to visit.    Review of Systems Patient denies any headache, lightheadedness or dizziness.  No sinus or allergy problems.  No chest pain, tightness or palpitations.  No increased shortness of breath or significant cough or congestion.  No nausea or vomiting.  No abdominal pain or cramping.  No bowel change, such as diarrhea, constipation, BRBPR or melana.  No urine change.  Increased  stress and depression in dealing with her husbands death.  Overall she feels she is coping relatively well.      Objective:   Physical Exam  Filed Vitals:   01/14/14 0853  BP: 139/77  Pulse: 77  Temp: 97.6 F (36.4 C)   Blood pressure recheck:  36/48  73 year old female in no acute distress.   HEENT:  Nares- clear.  Oropharynx - without lesions. NECK:  Supple.  Nontender.  No audible bruit.  HEART:  Appears to be regular. LUNGS:  No crackles or wheezing audible.  Respirations even and unlabored.  RADIAL PULSE:  Equal bilaterally.  ABDOMEN:  Soft, nontender.  Bowel sounds present and normal.  No audible abdominal bruit.    EXTREMITIES:  No increased edema present.  DP pulses palpable and equal bilaterally.   FEET:  No lesions.         Assessment & Plan:  1. Essential hypertension Blood pressure is doing well.  Same medication regimen.  Check metabolic panel.   2. Type 2 diabetes mellitus without complication Sugars have been well controlled.  Up to date with eye exams.   Lab Results  Component Value Date   HGBA1C 6.0 10/01/2013   3. Hypercholesteremia Remains on simvastatin.  Follow lipid panel and liver function.  Lab Results  Component Value Date  CHOL 143 10/01/2013   HDL 45.90 10/01/2013   LDLCALC 75 10/01/2013   TRIG 111.0 10/01/2013   CHOLHDL 3 10/01/2013   4. Breast cancer, unspecified laterality Mammogram 10/28/13 - [facility].   5. Chronic anticoagulation On coumadin.  Well controlled.    6. Adjustment disorder with mixed anxiety and depressed mood Feels she is coping well.  Does not feel needs any further intervention.  Follow.    7.  HISTORY OF PULMONARY EMBOLISM.  On coumadin.  Doing well.    HEALTH MAINTENANCE.  Physical 05/09/13.   Mammogram 10/28/13 - [facility] I.

## 2014-01-14 NOTE — Progress Notes (Signed)
Pre visit review using our clinic review tool, if applicable. No additional management support is needed unless otherwise documented below in the visit note. 

## 2014-01-14 NOTE — Addendum Note (Signed)
Addended by: Leeanne Rio on: 01/14/2014 10:09 AM   Modules accepted: Orders

## 2014-02-02 ENCOUNTER — Other Ambulatory Visit: Payer: BLUE CROSS/BLUE SHIELD

## 2014-02-02 ENCOUNTER — Other Ambulatory Visit: Payer: Self-pay | Admitting: Internal Medicine

## 2014-02-02 ENCOUNTER — Other Ambulatory Visit (INDEPENDENT_AMBULATORY_CARE_PROVIDER_SITE_OTHER): Payer: Medicare Other

## 2014-02-02 DIAGNOSIS — E7800 Pure hypercholesterolemia, unspecified: Secondary | ICD-10-CM

## 2014-02-02 DIAGNOSIS — I10 Essential (primary) hypertension: Secondary | ICD-10-CM | POA: Diagnosis not present

## 2014-02-02 DIAGNOSIS — E119 Type 2 diabetes mellitus without complications: Secondary | ICD-10-CM

## 2014-02-02 DIAGNOSIS — Z7901 Long term (current) use of anticoagulants: Secondary | ICD-10-CM

## 2014-02-02 DIAGNOSIS — I2699 Other pulmonary embolism without acute cor pulmonale: Secondary | ICD-10-CM

## 2014-02-02 LAB — LIPID PANEL
Cholesterol: 144 mg/dL (ref 0–200)
HDL: 45.6 mg/dL (ref >39.00)
LDL Cholesterol: 79 mg/dL (ref 0–99)
NONHDL: 98.4
TRIGLYCERIDES: 99 mg/dL (ref 0.0–149.0)
Total CHOL/HDL Ratio: 3
VLDL: 19.8 mg/dL (ref 0.0–40.0)

## 2014-02-02 LAB — BASIC METABOLIC PANEL
BUN: 17 mg/dL (ref 6–23)
CHLORIDE: 102 meq/L (ref 96–112)
CO2: 25 mEq/L (ref 19–32)
CREATININE: 0.9 mg/dL (ref 0.4–1.2)
Calcium: 9.4 mg/dL (ref 8.4–10.5)
GFR: 66.42 mL/min (ref >60.00)
Glucose, Bld: 103 mg/dL — ABNORMAL HIGH (ref 70–99)
Potassium: 4.7 mEq/L (ref 3.5–5.1)
Sodium: 135 mEq/L (ref 135–145)

## 2014-02-02 LAB — MICROALBUMIN / CREATININE URINE RATIO
Creatinine,U: 19.2 mg/dL
Microalb Creat Ratio: 0.5 mg/g (ref 0.0–30.0)
Microalb, Ur: 0.1 mg/dL (ref 0.0–1.9)

## 2014-02-02 LAB — HEPATIC FUNCTION PANEL
ALK PHOS: 36 U/L — AB (ref 39–117)
ALT: 20 U/L (ref 0–35)
AST: 21 U/L (ref 0–37)
Albumin: 3.9 g/dL (ref 3.5–5.2)
Bilirubin, Direct: 0.1 mg/dL (ref 0.0–0.3)
TOTAL PROTEIN: 6.6 g/dL (ref 6.0–8.3)
Total Bilirubin: 0.5 mg/dL (ref 0.2–1.2)

## 2014-02-02 LAB — PROTIME-INR
INR: 2.4 ratio — AB (ref 0.8–1.0)
PROTHROMBIN TIME: 25.7 s — AB (ref 9.6–13.1)

## 2014-02-02 LAB — HEMOGLOBIN A1C: Hgb A1c MFr Bld: 6.2 % (ref 4.6–6.5)

## 2014-02-02 NOTE — Progress Notes (Signed)
Order placed for f/u pt/inr

## 2014-02-03 ENCOUNTER — Encounter: Payer: Self-pay | Admitting: *Deleted

## 2014-02-17 ENCOUNTER — Other Ambulatory Visit (INDEPENDENT_AMBULATORY_CARE_PROVIDER_SITE_OTHER): Payer: Medicare Other

## 2014-02-17 ENCOUNTER — Telehealth: Payer: Self-pay | Admitting: *Deleted

## 2014-02-17 ENCOUNTER — Other Ambulatory Visit: Payer: BLUE CROSS/BLUE SHIELD

## 2014-02-17 DIAGNOSIS — I2699 Other pulmonary embolism without acute cor pulmonale: Secondary | ICD-10-CM | POA: Diagnosis not present

## 2014-02-17 LAB — PROTIME-INR
INR: 2.3 ratio — ABNORMAL HIGH (ref 0.8–1.0)
Prothrombin Time: 24.7 s — ABNORMAL HIGH (ref 9.6–13.1)

## 2014-02-17 NOTE — Telephone Encounter (Signed)
Noted & will try to remember but can not guarantee it.

## 2014-02-17 NOTE — Telephone Encounter (Signed)
Pt said she would prefer a call  instead of a letter with her lab results

## 2014-02-25 ENCOUNTER — Ambulatory Visit: Payer: BLUE CROSS/BLUE SHIELD | Admitting: Podiatry

## 2014-02-25 ENCOUNTER — Ambulatory Visit (INDEPENDENT_AMBULATORY_CARE_PROVIDER_SITE_OTHER): Payer: Medicare Other | Admitting: Podiatry

## 2014-02-25 DIAGNOSIS — M79676 Pain in unspecified toe(s): Secondary | ICD-10-CM

## 2014-02-25 DIAGNOSIS — B351 Tinea unguium: Secondary | ICD-10-CM | POA: Diagnosis not present

## 2014-02-25 NOTE — Progress Notes (Signed)
She presents today with a chief complaint of painful elongated toenails one through 5 bilateral.  Objective: Nails are thick yellow dystrophic onychomycotic and painful palpation.  Assessment: Pain in limb secondary to onychomycosis 1 through 5 bilateral.  Plan: Debridement of nails 1 through 5 bilateral covered service secondary to pain.

## 2014-03-01 ENCOUNTER — Ambulatory Visit: Payer: Self-pay | Admitting: Family Medicine

## 2014-03-01 DIAGNOSIS — Z853 Personal history of malignant neoplasm of breast: Secondary | ICD-10-CM | POA: Diagnosis not present

## 2014-03-01 DIAGNOSIS — R0982 Postnasal drip: Secondary | ICD-10-CM | POA: Diagnosis not present

## 2014-03-04 ENCOUNTER — Telehealth: Payer: Self-pay | Admitting: *Deleted

## 2014-03-04 NOTE — Telephone Encounter (Signed)
Pt called back to see if we had an appointment for her yet. Please advise

## 2014-03-04 NOTE — Telephone Encounter (Signed)
See if pt can be here tomorrow at 7:15 - work in for this.

## 2014-03-04 NOTE — Telephone Encounter (Signed)
Patient added to Dr. Bary Leriche schedule. Patient verbalized understanding. Patient is aware that this is a work in and her cough is the only problem that will be addressed in the visit.

## 2014-03-04 NOTE — Telephone Encounter (Signed)
Went to Urgent Care in [place] on Sunday & patient was instructed to take something OTC med, now she reports that she has a bad cough. Patient would like to be seen. Please advise.

## 2014-03-05 ENCOUNTER — Encounter: Payer: Self-pay | Admitting: Internal Medicine

## 2014-03-05 ENCOUNTER — Ambulatory Visit (INDEPENDENT_AMBULATORY_CARE_PROVIDER_SITE_OTHER): Payer: Medicare Other | Admitting: Internal Medicine

## 2014-03-05 VITALS — BP 153/76 | HR 82 | Temp 97.8°F | Ht 67.5 in | Wt 235.2 lb

## 2014-03-05 DIAGNOSIS — J019 Acute sinusitis, unspecified: Secondary | ICD-10-CM | POA: Diagnosis not present

## 2014-03-05 DIAGNOSIS — J069 Acute upper respiratory infection, unspecified: Secondary | ICD-10-CM | POA: Diagnosis not present

## 2014-03-05 MED ORDER — AZITHROMYCIN 250 MG PO TABS: ORAL_TABLET | ORAL | Status: DC

## 2014-03-05 MED ORDER — WARFARIN SODIUM 2.5 MG PO TABS: ORAL_TABLET | ORAL | Status: DC

## 2014-03-05 NOTE — Progress Notes (Signed)
Pre visit review using our clinic review tool, if applicable. No additional management support is needed unless otherwise documented below in the visit note. 

## 2014-03-05 NOTE — Patient Instructions (Signed)
Continue to flush your nose as you are doing.   nasacort nasal spray - 2 sprays each nostril one time per day.    Continue antibiotic.    Robitussin if needed.

## 2014-03-08 ENCOUNTER — Encounter: Payer: Self-pay | Admitting: Internal Medicine

## 2014-03-08 NOTE — Progress Notes (Signed)
   Subjective:    [name], female    DOB: [birth year], 74 y.o.   MRN: [id number]  HPI 74 year old female with past history of hypertension, diabetes, hypercholesterolemia, pulmonary embolus and breast cancer s/p left breast mastectomy.  She comes in today as a work in with concerns regarding increased nasal, sinus congestion and cough.  States symptoms started one week ago.  Head congestion.  Ears feel stopped up.  Minimal sore throat.  Increased nasal congestion and cough - productive light green mucus.  No wheezing.  No SOB.  Previous minimal nausea.  No vomiting.  Bowels stable.     Past Medical History  Diagnosis Date  . Hypertension   . Diabetes mellitus   . Breast cancer     s/p lumpectomy 1992.  s/p chemo and xrt  . Hypercholesterolemia   . Atrial fibrillation   . Anemia   . GERD (gastroesophageal reflux disease)   . Pulmonary emboli   . Gastric ulcer     Current Outpatient Prescriptions on File Prior to Visit  Medication Sig Dispense Refill  . ferrous sulfate 325 (65 FE) MG tablet Take 325 mg by mouth daily with breakfast.    . fluticasone (FLONASE) 50 MCG/ACT nasal spray Place 2 sprays into both nostrils daily. 16 g 1  . lisinopril (PRINIVIL,ZESTRIL) 20 MG tablet TAKE ONE (1) TABLET EACH DAY 90 tablet 1  . pantoprazole (PROTONIX) 40 MG tablet Take 40 mg by mouth daily.    . simvastatin (ZOCOR) 10 MG tablet TAKE ONE (1) TABLET EACH DAY 90 tablet 2   No current facility-administered medications on file prior to visit.    Review of Systems Patient denies any headache, lightheadedness or dizziness.  Increased sinus pressure and nasal congestion.  Minimal sore throat.  Increased cough and congestion.  No chest pain, tightness or palpatations.  No increased shortness of breath.   No nausea or vomiting.  No bowel change, such as diarrhea.         Objective:   Physical Exam Filed Vitals:   03/05/14 0713  BP: 153/76  Pulse: 82  Temp: 97.8 F (66.69  C)   74 year old female in no acute distress.   HEENT:  Nares- slightly erythematous turbinates.  Oropharynx - without lesions.  Minimal tenderness to palpation over the sinuses. NECK:  Supple.  Nontender.    HEART:  Appears to be regular. LUNGS:  No crackles or wheezing audible.  Respirations even and unlabored.         Assessment & Plan:  SINUSITIS/URI.  Treat with zpak as directed.  Continue saline nasal spray and nasacort nasal spray as directed.  Robitussin as directed.  Rest.  Fluids. Explained to her if symptoms change, worsen or do not resolve - needs to be reevaluated.

## 2014-03-18 ENCOUNTER — Other Ambulatory Visit (INDEPENDENT_AMBULATORY_CARE_PROVIDER_SITE_OTHER): Payer: Medicare Other

## 2014-03-18 DIAGNOSIS — Z7901 Long term (current) use of anticoagulants: Secondary | ICD-10-CM

## 2014-03-18 LAB — PROTIME-INR
INR: 2.2 ratio — ABNORMAL HIGH (ref 0.8–1.0)
Prothrombin Time: 24.1 s — ABNORMAL HIGH (ref 9.6–13.1)

## 2014-03-30 ENCOUNTER — Other Ambulatory Visit: Payer: Self-pay | Admitting: Internal Medicine

## 2014-04-16 ENCOUNTER — Other Ambulatory Visit (INDEPENDENT_AMBULATORY_CARE_PROVIDER_SITE_OTHER): Payer: Medicare Other

## 2014-04-16 DIAGNOSIS — Z7901 Long term (current) use of anticoagulants: Secondary | ICD-10-CM

## 2014-04-16 LAB — PROTIME-INR
INR: 2.3 ratio — AB (ref 0.8–1.0)
Prothrombin Time: 25 s — ABNORMAL HIGH (ref 9.6–13.1)

## 2014-04-17 DIAGNOSIS — K210 Gastro-esophageal reflux disease with esophagitis: Secondary | ICD-10-CM | POA: Diagnosis not present

## 2014-04-29 DIAGNOSIS — D225 Melanocytic nevi of trunk: Secondary | ICD-10-CM | POA: Diagnosis not present

## 2014-04-29 DIAGNOSIS — Z872 Personal history of diseases of the skin and subcutaneous tissue: Secondary | ICD-10-CM | POA: Diagnosis not present

## 2014-04-29 DIAGNOSIS — Z1283 Encounter for screening for malignant neoplasm of skin: Secondary | ICD-10-CM | POA: Diagnosis not present

## 2014-04-29 DIAGNOSIS — Z08 Encounter for follow-up examination after completed treatment for malignant neoplasm: Secondary | ICD-10-CM | POA: Diagnosis not present

## 2014-04-29 DIAGNOSIS — Z85828 Personal history of other malignant neoplasm of skin: Secondary | ICD-10-CM | POA: Diagnosis not present

## 2014-05-10 ENCOUNTER — Ambulatory Visit: Payer: Self-pay | Admitting: Registered Nurse

## 2014-05-10 DIAGNOSIS — E780 Pure hypercholesterolemia: Secondary | ICD-10-CM | POA: Diagnosis not present

## 2014-05-10 DIAGNOSIS — I10 Essential (primary) hypertension: Secondary | ICD-10-CM | POA: Diagnosis not present

## 2014-05-10 DIAGNOSIS — K219 Gastro-esophageal reflux disease without esophagitis: Secondary | ICD-10-CM | POA: Diagnosis not present

## 2014-05-10 DIAGNOSIS — J302 Other seasonal allergic rhinitis: Secondary | ICD-10-CM | POA: Diagnosis not present

## 2014-05-10 DIAGNOSIS — A084 Viral intestinal infection, unspecified: Secondary | ICD-10-CM | POA: Diagnosis not present

## 2014-05-10 DIAGNOSIS — E118 Type 2 diabetes mellitus with unspecified complications: Secondary | ICD-10-CM | POA: Diagnosis not present

## 2014-05-10 DIAGNOSIS — Z79899 Other long term (current) drug therapy: Secondary | ICD-10-CM | POA: Diagnosis not present

## 2014-05-15 ENCOUNTER — Ambulatory Visit (INDEPENDENT_AMBULATORY_CARE_PROVIDER_SITE_OTHER): Payer: Medicare Other | Admitting: Internal Medicine

## 2014-05-15 ENCOUNTER — Encounter: Payer: Self-pay | Admitting: Internal Medicine

## 2014-05-15 VITALS — BP 118/76 | HR 71 | Temp 97.8°F | Ht 67.5 in | Wt 235.0 lb

## 2014-05-15 DIAGNOSIS — C50919 Malignant neoplasm of unspecified site of unspecified female breast: Secondary | ICD-10-CM | POA: Diagnosis not present

## 2014-05-15 DIAGNOSIS — E119 Type 2 diabetes mellitus without complications: Secondary | ICD-10-CM | POA: Diagnosis not present

## 2014-05-15 DIAGNOSIS — Z0000 Encounter for general adult medical examination without abnormal findings: Secondary | ICD-10-CM

## 2014-05-15 DIAGNOSIS — I10 Essential (primary) hypertension: Secondary | ICD-10-CM | POA: Diagnosis not present

## 2014-05-15 DIAGNOSIS — E7800 Pure hypercholesterolemia, unspecified: Secondary | ICD-10-CM

## 2014-05-15 DIAGNOSIS — Z5181 Encounter for therapeutic drug level monitoring: Secondary | ICD-10-CM

## 2014-05-15 DIAGNOSIS — Z7901 Long term (current) use of anticoagulants: Secondary | ICD-10-CM | POA: Diagnosis not present

## 2014-05-15 LAB — PROTIME-INR
INR: 2.7 ratio — ABNORMAL HIGH (ref 0.8–1.0)
PROTHROMBIN TIME: 29.3 s — AB (ref 9.6–13.1)

## 2014-05-15 NOTE — Progress Notes (Signed)
Pre visit review using our clinic review tool, if applicable. No additional management support is needed unless otherwise documented below in the visit note. 

## 2014-05-16 ENCOUNTER — Other Ambulatory Visit: Payer: Self-pay | Admitting: Internal Medicine

## 2014-05-16 DIAGNOSIS — C50919 Malignant neoplasm of unspecified site of unspecified female breast: Secondary | ICD-10-CM

## 2014-05-16 DIAGNOSIS — E119 Type 2 diabetes mellitus without complications: Secondary | ICD-10-CM

## 2014-05-16 DIAGNOSIS — Z7901 Long term (current) use of anticoagulants: Secondary | ICD-10-CM

## 2014-05-16 DIAGNOSIS — I10 Essential (primary) hypertension: Secondary | ICD-10-CM

## 2014-05-16 DIAGNOSIS — E7800 Pure hypercholesterolemia, unspecified: Secondary | ICD-10-CM

## 2014-05-16 NOTE — Progress Notes (Signed)
Order placed for labs.

## 2014-05-23 ENCOUNTER — Encounter: Payer: Self-pay | Admitting: Internal Medicine

## 2014-05-23 DIAGNOSIS — Z0000 Encounter for general adult medical examination without abnormal findings: Secondary | ICD-10-CM | POA: Insufficient documentation

## 2014-05-23 NOTE — Assessment & Plan Note (Signed)
Low cholesterol diet and exercise.  Follow lipid panel and liver function tests.  Continue simvastatin.

## 2014-05-23 NOTE — Assessment & Plan Note (Signed)
Mammogram 10/28/13 - [facility] I.

## 2014-05-23 NOTE — Assessment & Plan Note (Signed)
Low carb diet and exercise.  Follow sugars.  Follow met b and a1c.

## 2014-05-23 NOTE — Assessment & Plan Note (Signed)
Schedule for physical next visit.  Mammogram 10/28/13 - [facility] I.

## 2014-05-23 NOTE — Assessment & Plan Note (Signed)
On coumadin.  History of pulmonary embolus.  Recheck pt/inr today.

## 2014-05-23 NOTE — Progress Notes (Signed)
[name], female   DOB: 1940/06/24, 74 y.o.   MRN: [id number]   Subjective:    [name], female    DOB: [birth year], 74 y.o.   MRN: [id number]  HPI  Patient here for a scheduled follow up.  States she is doing well.  Breathing stable.  No cardiac symptoms with increased activity or exertion.  Bowels stable.  Handling stress relatively well.     Past Medical History  Diagnosis Date  . Hypertension   . Diabetes mellitus   . Breast cancer     s/p lumpectomy 1992.  s/p chemo and xrt  . Hypercholesterolemia   . Atrial fibrillation   . Anemia   . GERD (gastroesophageal reflux disease)   . Pulmonary emboli   . Gastric ulcer      Current Outpatient Prescriptions on File Prior to Visit  Medication Sig Dispense Refill  . ferrous sulfate 325 (65 FE) MG tablet Take 325 mg by mouth daily with breakfast.    . lisinopril (PRINIVIL,ZESTRIL) 20 MG tablet TAKE ONE (1) TABLET BY MOUTH EVERY DAY 90 tablet 1  . loratadine (CLARITIN) 10 MG tablet Take 10 mg by mouth daily.    . pantoprazole (PROTONIX) 40 MG tablet Take 40 mg by mouth daily.    . simvastatin (ZOCOR) 10 MG tablet TAKE ONE (1) TABLET EACH DAY 90 tablet 2  . warfarin (COUMADIN) 2.5 MG tablet TAKE 2 TABLETS (5MG) ON Tuesday, Thursday and Saturday  AND TAKE 1 TABLET (2.5MG) ON ALL OTHER DAYS 120 tablet 1   No current facility-administered medications on file prior to visit.    Review of Systems  Constitutional: Negative for appetite change and unexpected weight change.  HENT: Negative for congestion and sinus pressure.   Respiratory: Negative for cough, chest tightness and shortness of breath.   Cardiovascular: Negative for chest pain, palpitations and leg swelling.  Gastrointestinal: Negative for nausea, vomiting (no vomiting now.  previously had a short episode of vomiting.  resolved.  probably viral.  ), abdominal pain and diarrhea.  Neurological: Negative for dizziness,  light-headedness and headaches.       Objective:    Physical Exam  Constitutional: She appears well-developed and well-nourished. No distress.  HENT:  Nose: Nose normal.  Mouth/Throat: Oropharynx is clear and moist.  Neck: Neck supple. No thyromegaly present.  Cardiovascular: Normal rate and regular rhythm.   Pulmonary/Chest: Breath sounds normal. No respiratory distress. She has no wheezes.  Abdominal: Soft. Bowel sounds are normal. There is no tenderness.  Musculoskeletal: She exhibits no edema or tenderness.  Lymphadenopathy:    She has no cervical adenopathy.  Skin: No rash noted. No erythema.    BP 118/76 mmHg  Pulse 71  Temp(Src) 97.8 F (36.6 C) (Oral)  Ht 5' 7.5" (1.715 m)  Wt 235 lb (106.595 kg)  BMI 36.24 kg/m2  SpO2 96% Wt Readings from Last 3 Encounters:  05/15/14 235 lb (106.595 kg)  03/05/14 235 lb 4 oz (106.709 kg)  01/14/14 239 lb 8 oz (108.636 kg)     Lab Results  Component Value Date   WBC 7.1 07/18/2013   HGB 13.9 07/18/2013   HCT 41.6 07/18/2013   PLT 237.0 07/18/2013   GLUCOSE 103* 02/02/2014   CHOL 144 02/02/2014   TRIG 99.0 02/02/2014   HDL 45.60 02/02/2014   LDLCALC 79 02/02/2014   ALT 20 02/02/2014   AST 21 02/02/2014   NA 135 02/02/2014   K 4.7  02/02/2014   CL 102 02/02/2014   CREATININE 0.9 02/02/2014   BUN 17 02/02/2014   CO2 25 02/02/2014   TSH 3.31 06/04/2013   INR 2.7* 05/15/2014   HGBA1C 6.2 02/02/2014   MICROALBUR 0.1 02/02/2014    No results found.     Assessment & Plan:   Problem List Items Addressed This Visit    Breast cancer    Mammogram 10/28/13 - [facility].       Chronic anticoagulation    On coumadin.  History of pulmonary embolus.  Recheck pt/inr today.        Diabetes mellitus    Low carb diet and exercise.  Follow sugars.  Follow met b and a1c.       Health care maintenance    Schedule for physical next visit.  Mammogram 10/28/13 - [facility] I.        Hypercholesteremia    Low cholesterol diet  and exercise.  Follow lipid panel and liver function tests.  Continue simvastatin.        Hypertension    Blood pressure under good control.  Follow pressures.  Same medication regimen.  Follow metabolic panel.        Other Visit Diagnoses    Anticoagulated on Coumadin    -  Primary    Relevant Orders    Protime-INR (Completed)        Einar Pheasant, MD

## 2014-05-23 NOTE — Assessment & Plan Note (Signed)
Blood pressure under good control.  Follow pressures.  Same medication regimen.  Follow metabolic panel.

## 2014-05-25 ENCOUNTER — Ambulatory Visit (INDEPENDENT_AMBULATORY_CARE_PROVIDER_SITE_OTHER): Payer: Medicare Other | Admitting: Podiatry

## 2014-05-25 DIAGNOSIS — B351 Tinea unguium: Secondary | ICD-10-CM

## 2014-05-25 DIAGNOSIS — M79676 Pain in unspecified toe(s): Secondary | ICD-10-CM | POA: Diagnosis not present

## 2014-05-25 NOTE — Progress Notes (Signed)
She presents today with a chief complaint of painful elongated toenails one through 5 bilateral.  Objective: Nails are thick yellow dystrophic onychomycotic and painful palpation.  Assessment: Pain in limb secondary to onychomycosis 1 through 5 bilateral.  Plan: Debridement of nails 1 through 5 bilateral covered service secondary to pain.

## 2014-06-10 ENCOUNTER — Other Ambulatory Visit (INDEPENDENT_AMBULATORY_CARE_PROVIDER_SITE_OTHER): Payer: Medicare Other

## 2014-06-10 DIAGNOSIS — E119 Type 2 diabetes mellitus without complications: Secondary | ICD-10-CM

## 2014-06-10 DIAGNOSIS — Z7901 Long term (current) use of anticoagulants: Secondary | ICD-10-CM

## 2014-06-10 DIAGNOSIS — E7800 Pure hypercholesterolemia, unspecified: Secondary | ICD-10-CM

## 2014-06-10 LAB — LIPID PANEL
Cholesterol: 151 mg/dL (ref 0–200)
HDL: 53.4 mg/dL
LDL Cholesterol: 80 mg/dL (ref 0–99)
NonHDL: 97.6
Total CHOL/HDL Ratio: 3
Triglycerides: 87 mg/dL (ref 0.0–149.0)
VLDL: 17.4 mg/dL (ref 0.0–40.0)

## 2014-06-10 LAB — PROTIME-INR
INR: 2 ratio — ABNORMAL HIGH (ref 0.8–1.0)
Prothrombin Time: 21.6 s — ABNORMAL HIGH (ref 9.6–13.1)

## 2014-06-10 LAB — BASIC METABOLIC PANEL
BUN: 16 mg/dL (ref 6–23)
CO2: 26 mEq/L (ref 19–32)
Calcium: 9.7 mg/dL (ref 8.4–10.5)
Chloride: 103 mEq/L (ref 96–112)
Creatinine, Ser: 0.85 mg/dL (ref 0.40–1.20)
GFR: 69.07 mL/min (ref >60.00)
GLUCOSE: 101 mg/dL — AB (ref 70–99)
POTASSIUM: 4.4 meq/L (ref 3.5–5.1)
SODIUM: 137 meq/L (ref 135–145)

## 2014-06-10 LAB — HEPATIC FUNCTION PANEL
ALT: 18 U/L (ref 0–35)
AST: 21 U/L (ref 0–37)
Albumin: 3.8 g/dL (ref 3.5–5.2)
Alkaline Phosphatase: 37 U/L — ABNORMAL LOW (ref 39–117)
Bilirubin, Direct: 0.1 mg/dL (ref 0.0–0.3)
Total Bilirubin: 0.5 mg/dL (ref 0.2–1.2)
Total Protein: 6.6 g/dL (ref 6.0–8.3)

## 2014-06-10 LAB — TSH: TSH: 3.31 u[IU]/mL (ref 0.35–4.50)

## 2014-06-10 LAB — HEMOGLOBIN A1C: Hgb A1c MFr Bld: 5.8 % (ref 4.6–6.5)

## 2014-06-22 ENCOUNTER — Telehealth: Payer: Self-pay | Admitting: Internal Medicine

## 2014-06-22 NOTE — Telephone Encounter (Signed)
Form placed in blue folder (pt wants it mailed back to her)

## 2014-06-22 NOTE — Telephone Encounter (Signed)
Signed and placed in your box.   

## 2014-06-22 NOTE — Telephone Encounter (Signed)
Pt dropped off handicap form to be filled out. Pt request the form be mailed back to her. Form in Dr. Bary Leriche box/msn

## 2014-06-23 NOTE — Telephone Encounter (Signed)
Form mailed to patient.

## 2014-06-26 ENCOUNTER — Other Ambulatory Visit: Payer: Self-pay | Admitting: Internal Medicine

## 2014-07-01 ENCOUNTER — Other Ambulatory Visit (INDEPENDENT_AMBULATORY_CARE_PROVIDER_SITE_OTHER): Payer: Medicare Other

## 2014-07-01 DIAGNOSIS — Z7901 Long term (current) use of anticoagulants: Secondary | ICD-10-CM

## 2014-07-01 LAB — PROTIME-INR
INR: 1.8 ratio — ABNORMAL HIGH (ref 0.8–1.0)
PROTHROMBIN TIME: 19.2 s — AB (ref 9.6–13.1)

## 2014-07-03 ENCOUNTER — Emergency Department
Admit: 2014-07-03 | Disposition: A | Discharge status: Home / Self Care | Payer: Self-pay | Admitting: Emergency Medicine

## 2014-07-09 ENCOUNTER — Other Ambulatory Visit: Payer: BLUE CROSS/BLUE SHIELD

## 2014-07-10 ENCOUNTER — Other Ambulatory Visit (INDEPENDENT_AMBULATORY_CARE_PROVIDER_SITE_OTHER): Payer: Medicare Other

## 2014-07-10 DIAGNOSIS — Z7901 Long term (current) use of anticoagulants: Secondary | ICD-10-CM

## 2014-07-10 LAB — PROTIME-INR
INR: 1.8 ratio — ABNORMAL HIGH (ref 0.8–1.0)
Prothrombin Time: 19.9 s — ABNORMAL HIGH (ref 9.6–13.1)

## 2014-07-21 ENCOUNTER — Other Ambulatory Visit (INDEPENDENT_AMBULATORY_CARE_PROVIDER_SITE_OTHER): Payer: Medicare Other

## 2014-07-21 DIAGNOSIS — Z7901 Long term (current) use of anticoagulants: Secondary | ICD-10-CM | POA: Diagnosis not present

## 2014-07-21 LAB — PROTIME-INR
INR: 2.4 ratio — ABNORMAL HIGH (ref 0.8–1.0)
PROTHROMBIN TIME: 25.5 s — AB (ref 9.6–13.1)

## 2014-07-27 ENCOUNTER — Other Ambulatory Visit (INDEPENDENT_AMBULATORY_CARE_PROVIDER_SITE_OTHER): Payer: Medicare Other

## 2014-07-27 ENCOUNTER — Other Ambulatory Visit: Payer: BLUE CROSS/BLUE SHIELD

## 2014-07-27 DIAGNOSIS — Z7901 Long term (current) use of anticoagulants: Secondary | ICD-10-CM

## 2014-07-27 LAB — PROTIME-INR
INR: 2.4 ratio — ABNORMAL HIGH (ref 0.8–1.0)
PROTHROMBIN TIME: 25.6 s — AB (ref 9.6–13.1)

## 2014-07-28 ENCOUNTER — Encounter: Payer: Self-pay | Admitting: *Deleted

## 2014-08-10 ENCOUNTER — Other Ambulatory Visit (INDEPENDENT_AMBULATORY_CARE_PROVIDER_SITE_OTHER): Payer: Medicare Other

## 2014-08-10 DIAGNOSIS — Z7901 Long term (current) use of anticoagulants: Secondary | ICD-10-CM

## 2014-08-10 LAB — PROTIME-INR
INR: 2.4 ratio — ABNORMAL HIGH (ref 0.8–1.0)
Prothrombin Time: 26.1 s — ABNORMAL HIGH (ref 9.6–13.1)

## 2014-08-25 ENCOUNTER — Other Ambulatory Visit: Payer: Self-pay | Admitting: Internal Medicine

## 2014-08-28 ENCOUNTER — Other Ambulatory Visit (INDEPENDENT_AMBULATORY_CARE_PROVIDER_SITE_OTHER): Payer: Medicare Other

## 2014-08-28 DIAGNOSIS — Z7901 Long term (current) use of anticoagulants: Secondary | ICD-10-CM | POA: Diagnosis not present

## 2014-08-28 LAB — PROTIME-INR
INR: 2.4 ratio — AB (ref 0.8–1.0)
Prothrombin Time: 26 s — ABNORMAL HIGH (ref 9.6–13.1)

## 2014-08-31 ENCOUNTER — Telehealth: Payer: Self-pay | Admitting: Internal Medicine

## 2014-08-31 ENCOUNTER — Ambulatory Visit (INDEPENDENT_AMBULATORY_CARE_PROVIDER_SITE_OTHER): Payer: Medicare Other | Admitting: Podiatry

## 2014-08-31 DIAGNOSIS — M79676 Pain in unspecified toe(s): Secondary | ICD-10-CM | POA: Diagnosis not present

## 2014-08-31 DIAGNOSIS — B351 Tinea unguium: Secondary | ICD-10-CM | POA: Diagnosis not present

## 2014-08-31 LAB — HM DIABETES FOOT EXAM

## 2014-08-31 NOTE — Progress Notes (Signed)
She presents today with a chief complaint of painful elongated toenails one through 5 bilateral.  Objective: Nails are thick yellow dystrophic onychomycotic and painful palpation.  Assessment: Pain in limb secondary to onychomycosis 1 through 5 bilateral.  Plan: Debridement of nails 1 through 5 bilateral covered service secondary to pain.         Likes nails trimmed SHORT.

## 2014-08-31 NOTE — Telephone Encounter (Signed)
Okay to have labs during upcoming appt on 7/20. Pt notified.

## 2014-08-31 NOTE — Telephone Encounter (Signed)
Pt is requesting to have INR/PT labs to be done. Please advise so we schedule appt.. (Pt is requesting to have the labs done at her next appt on 7/20)

## 2014-09-16 ENCOUNTER — Ambulatory Visit (INDEPENDENT_AMBULATORY_CARE_PROVIDER_SITE_OTHER): Payer: Medicare Other | Admitting: Internal Medicine

## 2014-09-16 ENCOUNTER — Encounter: Payer: Self-pay | Admitting: Internal Medicine

## 2014-09-16 VITALS — BP 120/77 | HR 76 | Temp 98.1°F | Ht 67.5 in | Wt 236.2 lb

## 2014-09-16 DIAGNOSIS — Z0000 Encounter for general adult medical examination without abnormal findings: Secondary | ICD-10-CM | POA: Diagnosis not present

## 2014-09-16 DIAGNOSIS — Z7901 Long term (current) use of anticoagulants: Secondary | ICD-10-CM | POA: Diagnosis not present

## 2014-09-16 DIAGNOSIS — C50919 Malignant neoplasm of unspecified site of unspecified female breast: Secondary | ICD-10-CM

## 2014-09-16 DIAGNOSIS — Z5181 Encounter for therapeutic drug level monitoring: Secondary | ICD-10-CM

## 2014-09-16 DIAGNOSIS — B370 Candidal stomatitis: Secondary | ICD-10-CM | POA: Diagnosis not present

## 2014-09-16 DIAGNOSIS — F4323 Adjustment disorder with mixed anxiety and depressed mood: Secondary | ICD-10-CM

## 2014-09-16 DIAGNOSIS — I10 Essential (primary) hypertension: Secondary | ICD-10-CM

## 2014-09-16 DIAGNOSIS — E7800 Pure hypercholesterolemia, unspecified: Secondary | ICD-10-CM

## 2014-09-16 DIAGNOSIS — E119 Type 2 diabetes mellitus without complications: Secondary | ICD-10-CM

## 2014-09-16 DIAGNOSIS — Z1239 Encounter for other screening for malignant neoplasm of breast: Secondary | ICD-10-CM

## 2014-09-16 LAB — PROTIME-INR
INR: 2.7 ratio — ABNORMAL HIGH (ref 0.8–1.0)
Prothrombin Time: 29 s — ABNORMAL HIGH (ref 9.6–13.1)

## 2014-09-16 MED ORDER — FIRST-DUKES MOUTHWASH MT SUSP: OROMUCOSAL | Status: DC
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
## 2014-09-16 NOTE — Progress Notes (Signed)
Pre visit review using our clinic review tool, if applicable. No additional management support is needed unless otherwise documented below in the visit note. 

## 2014-09-17 ENCOUNTER — Encounter: Payer: Self-pay | Admitting: Internal Medicine

## 2014-09-17 DIAGNOSIS — B370 Candidal stomatitis: Secondary | ICD-10-CM | POA: Insufficient documentation

## 2014-09-17 NOTE — Assessment & Plan Note (Signed)
Blood pressure under good control.  Continue same medication regimen.  Follow pressures.  Follow metabolic panel.   

## 2014-09-17 NOTE — Assessment & Plan Note (Signed)
Treat with Dukes mouthwash as directed.  Notify me if symptoms do not resolve.

## 2014-09-17 NOTE — Assessment & Plan Note (Signed)
Physical today 09/16/14.  Mammogram 10/28/13 - Bir[facility] I.  Schedule f/u mammogram.  Colonoscopy 02/2009 - diverticulosis and internal hemorrhoids.

## 2014-09-17 NOTE — Assessment & Plan Note (Signed)
Low cholesterol diet and exercise.  On simvastatin.  Follow lipid panel and liver function tests.   

## 2014-09-17 NOTE — Assessment & Plan Note (Signed)
Has a history of pulmonary embolus (bilateral).  Check pt/inr today.

## 2014-09-17 NOTE — Assessment & Plan Note (Signed)
Discussed with her today. Desires no further intervention.  Follow.   

## 2014-09-17 NOTE — Assessment & Plan Note (Addendum)
Mammogram 10/28/13 - [facility] I.  Schedule f/u mammogram.

## 2014-09-17 NOTE — Assessment & Plan Note (Signed)
Low carb diet and exercise.  Follow met b and a1c.  Keep up to date with eye checks.   Lab Results  Component Value Date   HGBA1C 5.8 06/10/2014

## 2014-09-23 ENCOUNTER — Encounter: Payer: BLUE CROSS/BLUE SHIELD | Admitting: Internal Medicine

## 2014-09-25 ENCOUNTER — Other Ambulatory Visit: Payer: Self-pay | Admitting: Internal Medicine

## 2014-10-02 ENCOUNTER — Other Ambulatory Visit (INDEPENDENT_AMBULATORY_CARE_PROVIDER_SITE_OTHER): Payer: Medicare Other

## 2014-10-02 ENCOUNTER — Telehealth: Payer: Self-pay | Admitting: *Deleted

## 2014-10-02 ENCOUNTER — Other Ambulatory Visit: Payer: BLUE CROSS/BLUE SHIELD

## 2014-10-02 DIAGNOSIS — Z5181 Encounter for therapeutic drug level monitoring: Secondary | ICD-10-CM

## 2014-10-02 DIAGNOSIS — E7800 Pure hypercholesterolemia, unspecified: Secondary | ICD-10-CM

## 2014-10-02 DIAGNOSIS — E119 Type 2 diabetes mellitus without complications: Secondary | ICD-10-CM | POA: Diagnosis not present

## 2014-10-02 DIAGNOSIS — I10 Essential (primary) hypertension: Secondary | ICD-10-CM

## 2014-10-02 DIAGNOSIS — Z7901 Long term (current) use of anticoagulants: Secondary | ICD-10-CM | POA: Diagnosis not present

## 2014-10-02 LAB — HEPATIC FUNCTION PANEL
ALK PHOS: 39 U/L (ref 39–117)
ALT: 17 U/L (ref 0–35)
AST: 22 U/L (ref 0–37)
Albumin: 4 g/dL (ref 3.5–5.2)
Bilirubin, Direct: 0.1 mg/dL (ref 0.0–0.3)
TOTAL PROTEIN: 6.9 g/dL (ref 6.0–8.3)
Total Bilirubin: 0.7 mg/dL (ref 0.2–1.2)

## 2014-10-02 LAB — LIPID PANEL
CHOLESTEROL: 145 mg/dL (ref 0–200)
HDL: 53.5 mg/dL (ref >39.00)
LDL Cholesterol: 71 mg/dL (ref 0–99)
NonHDL: 91.86
Total CHOL/HDL Ratio: 3
Triglycerides: 106 mg/dL (ref 0.0–149.0)
VLDL: 21.2 mg/dL (ref 0.0–40.0)

## 2014-10-02 LAB — CBC WITH DIFFERENTIAL/PLATELET
Basophils Absolute: 0 10*3/uL (ref 0.0–0.1)
Basophils Relative: 0.5 % (ref 0.0–3.0)
EOS ABS: 0.1 10*3/uL (ref 0.0–0.7)
Eosinophils Relative: 2.2 % (ref 0.0–5.0)
HCT: 42.7 % (ref 36.0–46.0)
Hemoglobin: 14.3 g/dL (ref 12.0–15.0)
LYMPHS ABS: 1.5 10*3/uL (ref 0.7–4.0)
LYMPHS PCT: 25.4 % (ref 12.0–46.0)
MCHC: 33.5 g/dL (ref 30.0–36.0)
MCV: 89.6 fl (ref 78.0–100.0)
MONO ABS: 0.8 10*3/uL (ref 0.1–1.0)
Monocytes Relative: 12.9 % — ABNORMAL HIGH (ref 3.0–12.0)
Neutro Abs: 3.6 10*3/uL (ref 1.4–7.7)
Neutrophils Relative %: 59 % (ref 43.0–77.0)
Platelets: 214 10*3/uL (ref 150.0–400.0)
RBC: 4.77 Mil/uL (ref 3.87–5.11)
RDW: 14.2 % (ref 11.5–15.5)
WBC: 6.1 10*3/uL (ref 4.0–10.5)

## 2014-10-02 LAB — BASIC METABOLIC PANEL
BUN: 16 mg/dL (ref 6–23)
CO2: 28 mEq/L (ref 19–32)
Calcium: 9.9 mg/dL (ref 8.4–10.5)
Chloride: 102 mEq/L (ref 96–112)
Creatinine, Ser: 0.91 mg/dL (ref 0.40–1.20)
GFR: 63.79 mL/min (ref >60.00)
Glucose, Bld: 107 mg/dL — ABNORMAL HIGH (ref 70–99)
Potassium: 4.9 mEq/L (ref 3.5–5.1)
Sodium: 136 mEq/L (ref 135–145)

## 2014-10-02 LAB — HEMOGLOBIN A1C: Hgb A1c MFr Bld: 5.8 % (ref 4.6–6.5)

## 2014-10-02 LAB — PROTIME-INR
INR: 2.4 ratio — ABNORMAL HIGH (ref 0.8–1.0)
Prothrombin Time: 25.6 s — ABNORMAL HIGH (ref 9.6–13.1)

## 2014-10-02 NOTE — Telephone Encounter (Signed)
Pt states she wants someone to call her to be able to make an appointment

## 2014-10-21 ENCOUNTER — Other Ambulatory Visit: Payer: BLUE CROSS/BLUE SHIELD

## 2014-10-29 ENCOUNTER — Other Ambulatory Visit: Payer: Self-pay | Admitting: Internal Medicine

## 2014-10-31 ENCOUNTER — Ambulatory Visit
Admission: EM | Admit: 2014-10-31 | Discharge: 2014-10-31 | Disposition: A | Discharge status: Home / Self Care | Payer: Medicare Other | Attending: Family Medicine | Admitting: Family Medicine

## 2014-10-31 ENCOUNTER — Encounter: Payer: Self-pay | Admitting: Emergency Medicine

## 2014-10-31 DIAGNOSIS — B029 Zoster without complications: Secondary | ICD-10-CM | POA: Diagnosis not present

## 2014-10-31 DIAGNOSIS — R21 Rash and other nonspecific skin eruption: Secondary | ICD-10-CM

## 2014-10-31 MED ORDER — VALACYCLOVIR HCL 1 G PO TABS: 1000.0000 mg | ORAL_TABLET | Freq: Three times a day (TID) | ORAL | Status: AC

## 2014-10-31 NOTE — Discharge Instructions (Signed)
Take medication as prescribed. Avoid scratching.   Follow up with your primary care physician Dr Nicki Reaper next week for follow up.   Return to Urgent care or go to ER for fever, weakness, new or worsening concerns.   Shingles Shingles (herpes zoster) is an infection that is caused by the same virus that causes chickenpox (varicella). The infection causes a painful skin rash and fluid-filled blisters, which eventually break open, crust over, and heal. It may occur in any area of the body, but it usually affects only one side of the body or face. The pain of shingles usually lasts about 1 month. However, some people with shingles may develop long-term (chronic) pain in the affected area of the body. Shingles often occurs many years after the person had chickenpox. It is more common:  In people older than 50 years.  In people with weakened immune systems, such as those with HIV, AIDS, or cancer.  In people taking medicines that weaken the immune system, such as transplant medicines.  In people under great stress. CAUSES  Shingles is caused by the varicella zoster virus (VZV), which also causes chickenpox. After a person is infected with the virus, it can remain in the person's body for years in an inactive state (dormant). To cause shingles, the virus reactivates and breaks out as an infection in a nerve root. The virus can be spread from person to person (contagious) through contact with open blisters of the shingles rash. It will only spread to people who have not had chickenpox. When these people are exposed to the virus, they may develop chickenpox. They will not develop shingles. Once the blisters scab over, the person is no longer contagious and cannot spread the virus to others. SIGNS AND SYMPTOMS  Shingles shows up in stages. The initial symptoms may be pain, itching, and tingling in an area of the skin. This pain is usually described as burning, stabbing, or throbbing.In a few days or weeks,  a painful red rash will appear in the area where the pain, itching, and tingling were felt. The rash is usually on one side of the body in a band or belt-like pattern. Then, the rash usually turns into fluid-filled blisters. They will scab over and dry up in approximately 2-3 weeks. Flu-like symptoms may also occur with the initial symptoms, the rash, or the blisters. These may include:  Fever.  Chills.  Headache.  Upset stomach. DIAGNOSIS  Your health care provider will perform a skin exam to diagnose shingles. Skin scrapings or fluid samples may also be taken from the blisters. This sample will be examined under a microscope or sent to a lab for further testing. TREATMENT  There is no specific cure for shingles. Your health care provider will likely prescribe medicines to help you manage the pain, recover faster, and avoid long-term problems. This may include antiviral drugs, anti-inflammatory drugs, and pain medicines. HOME CARE INSTRUCTIONS   Take a cool bath or apply cool compresses to the area of the rash or blisters as directed. This may help with the pain and itching.   Take medicines only as directed by your health care provider.   Rest as directed by your health care provider.  Keep your rash and blisters clean with mild soap and cool water or as directed by your health care provider.  Do not pick your blisters or scratch your rash. Apply an anti-itch cream or numbing creams to the affected area as directed by your health care provider.  Keep your shingles rash covered with a loose bandage (dressing).  Avoid skin contact with:  Babies.   Pregnant women.   Children with eczema.   Elderly people with transplants.   People with chronic illnesses, such as leukemia or AIDS.   Wear loose-fitting clothing to help ease the pain of material rubbing against the rash.  Keep all follow-up visits as directed by your health care provider.If the area involved is on your  face, you may receive a referral for a specialist, such as an eye doctor (ophthalmologist) or an ear, nose, and throat (ENT) doctor. Keeping all follow-up visits will help you avoid eye problems, chronic pain, or disability.  SEEK IMMEDIATE MEDICAL CARE IF:   You have facial pain, pain around the eye area, or loss of feeling on one side of your face.  You have ear pain or ringing in your ear.  You have loss of taste.  Your pain is not relieved with prescribed medicines.   Your redness or swelling spreads.   You have more pain and swelling.  Your condition is worsening or has changed.   You have a fever. MAKE SURE YOU:  Understand these instructions.  Will watch your condition.  Will get help right away if you are not doing well or get worse. Document Released: 02/20/2005 Document Revised: 07/07/2013 Document Reviewed: 10/05/2011 [facility] Patient Information 2015 [place], [place]. This information is not intended to replace advice given to you by your health care provider. Make sure you discuss any questions you have with your health care provider.  Rash A rash is a change in the color or texture of your skin. There are many different types of rashes. You may have other problems that accompany your rash. CAUSES   Infections.  Allergic reactions. This can include allergies to pets or foods.  Certain medicines.  Exposure to certain chemicals, soaps, or cosmetics.  Heat.  Exposure to poisonous plants.  Tumors, both cancerous and noncancerous. SYMPTOMS   Redness.  Scaly skin.  Itchy skin.  Dry or cracked skin.  Bumps.  Blisters.  Pain. DIAGNOSIS  Your caregiver may do a physical exam to determine what type of rash you have. A skin sample (biopsy) may be taken and examined under a microscope. TREATMENT  Treatment depends on the type of rash you have. Your caregiver may prescribe certain medicines. For serious conditions, you may need to see a skin doctor  (dermatologist). HOME CARE INSTRUCTIONS   Avoid the substance that caused your rash.  Do not scratch your rash. This can cause infection.  You may take cool baths to help stop itching.  Only take over-the-counter or prescription medicines as directed by your caregiver.  Keep all follow-up appointments as directed by your caregiver. SEEK IMMEDIATE MEDICAL CARE IF:  You have increasing pain, swelling, or redness.  You have a fever.  You have new or severe symptoms.  You have body aches, diarrhea, or vomiting.  Your rash is not better after 3 days. MAKE SURE YOU:  Understand these instructions.  Will watch your condition.  Will get help right away if you are not doing well or get worse. Document Released: 02/10/2002 Document Revised: 05/15/2011 Document Reviewed: 12/05/2010 [employer] Patient Information 2015 [place], [place]. This information is not intended to replace advice given to you by your health care provider. Make sure you discuss any questions you have with your health care provider.

## 2014-10-31 NOTE — ED Provider Notes (Signed)
[facility] Emergency Department Provider Note  ____________________________________________  Time seen: Approximately 10:36 AM  I have reviewed the triage vital signs and the nursing notes.   HISTORY  Chief Complaint Rash   HPI Anita Walker is a 74 y.o. female presents for complaints of rash to left chest. Reports rash onset yesterday. States x one week felt like the same area was somewhat itchy but there was no rash present or change in skin appearance. States yesterday, rash appeared and worsened today. States rash is itchy and burning. Denies pain. Denies chest pain, shortness of breath, pain. Denies other rash. Denies changes in foods, medications, detergents or other contacts. Denies injury. Denies insect bites, denies being outside for prolong periods, denies being in or near wooded areas.    Past Medical History  Diagnosis Date  . Hypertension   . Diabetes mellitus   . Breast cancer     s/p lumpectomy 1992.  s/p chemo and xrt  . Hypercholesterolemia   . Atrial fibrillation   . Anemia   . GERD (gastroesophageal reflux disease)   . Pulmonary emboli   . Gastric ulcer     Patient Active Problem List   Diagnosis Date Noted  . Thrush, oral 09/17/2014  . Health care maintenance 05/23/2014  . Adjustment disorder with mixed anxiety and depressed mood 07/20/2013  . UTI (urinary tract infection) 07/20/2013  . Chronic anticoagulation 04/11/2013  . Ecchymosis 04/11/2013  . Cough 04/10/2012  . Hypertension 01/01/2012  . Diabetes mellitus 01/01/2012  . Hypercholesteremia 01/01/2012  . Breast cancer 01/01/2012    Past Surgical History  Procedure Laterality Date  . Breast lumpectomy      left breast    Current Outpatient Rx  Name  Route  Sig  Dispense  Refill  . ferrous sulfate 325 (65 FE) MG tablet      TAKE ONE (1) TABLET BY MOUTH EVERY DAY   90 tablet   1   . lisinopril (PRINIVIL,ZESTRIL) 20 MG tablet      TAKE ONE (1) TABLET  BY MOUTH EVERY DAY   90 tablet   1   . loratadine (CLARITIN) 10 MG tablet   Oral   Take 10 mg by mouth daily.         . pantoprazole (PROTONIX) 40 MG tablet   Oral   Take 40 mg by mouth daily.         . simvastatin (ZOCOR) 10 MG tablet      TAKE ONE (1) TABLET EACH DAY   90 tablet   1   . warfarin (COUMADIN) 2.5 MG tablet      TAKE 2 TABLETS ON TUESDAY, THURSDAY, & SATURDAY AND TAKE 1 TABLET ON ALL OTHER DAYS   120 tablet   3   . Diphenhyd-Hydrocort-Nystatin (FIRST-DUKES MOUTHWASH) SUSP      5-10cc's swish and spit tid   237 mL   0     Allergies Penicillins  Family History  Problem Relation Age of Onset  . Heart disease Father   . Heart disease Brother     s/p CABG  . Colon cancer Neg Hx     Social History Social History  Substance Use Topics  . Smoking status: Former Research scientist (life sciences)  . Smokeless tobacco: Never Used  . Alcohol Use: No    Review of Systems Constitutional: No fever/chills Eyes: No visual changes. ENT: No sore throat. Cardiovascular: Denies chest pain. Respiratory: Denies shortness of breath. Gastrointestinal: No abdominal pain.  No  nausea, no vomiting.  No diarrhea.  No constipation. Genitourinary: Negative for dysuria. Musculoskeletal: Negative for back pain. Skin: positive for rash. Neurological: Negative for headaches, focal weakness or numbness.  10-point ROS otherwise negative.  ____________________________________________   PHYSICAL EXAM:  VITAL SIGNS: ED Triage Vitals  Enc Vitals Group     BP 10/31/14 1012 130/52 mmHg     Pulse Rate 10/31/14 1012 86     Resp 10/31/14 1012 16     Temp 10/31/14 1012 97.1 F (36.2 C)     Temp Source 10/31/14 1012 Tympanic     SpO2 10/31/14 1012 99 %     Weight 10/31/14 1012 235 lb (106.595 kg)     Height 10/31/14 1012 5\' 9"  (1.753 m)     Head Cir --      Peak Flow --      Pain Score 10/31/14 1012 0     Pain Loc --      Pain Edu? --      Excl. in [place]? --     Constitutional: Alert  and oriented. Well appearing and in no acute distress. Eyes: Conjunctivae are normal. PERRL. EOMI. Head: Atraumatic.  Nose: No congestion/rhinnorhea.  Mouth/Throat: Mucous membranes are moist.  Oropharynx non-erythematous. Neck: No stridor.  No cervical spine tenderness to palpation. Hematological/Lymphatic/Immunilogical: No cervical lymphadenopathy. Cardiovascular: Normal rate, regular rhythm. Grossly normal heart sounds.  Good peripheral circulation. Respiratory: Normal respiratory effort.  No retractions. Lungs CTAB. Gastrointestinal: Soft and nontender. No distention. Normal Bowel sounds.No CVA tenderness. Musculoskeletal: No lower or upper extremity tenderness nor edema.  No joint effusions. Bilateral pedal pulses equal and easily palpated. No cervical, thoracic or lumbar TTP.  Neurologic:  Normal speech and language. No gross focal neurologic deficits are appreciated. No gait instability. Skin:  Skin is warm, dry and intact. No rash noted. Bilateral mastectomy.  Except: left anterior close to axilla line clustered area vesicular rash, nontender, minimally erythematic base, no surrounding erythema, no induration. Rash does not cross midline. No other rash present.  Psychiatric: Mood and affect are normal. Speech and behavior are normal.  ____________________________________________   LABS (all labs ordered are listed, but only abnormal results are displayed)  Labs Reviewed - No data to display  INITIAL IMPRESSION / ASSESSMENT AND PLAN / ED COURSE  Pertinent labs & imaging results that were available during my care of the patient were reviewed by me and considered in my medical decision making (see chart for details).  Presents with unilateral rash to left chest since yesterday. States mild itching and burning sensation. Denies pain. Denies chest pain, shortness of breath, known trigger, or other changes. States x one week prior to onset some pruritis and skin irritation but sudden  onset rash. Suspect herpes zoster rash. Will treat with valacyclovir x 7 days. Follow up closely with PCP. Follow up with PCP next week for follow up . Discussed strict follow up and return parameters. Patient verbalized understanding and agreed to plan.  ____________________________________________   FINAL CLINICAL IMPRESSION(S) / ED DIAGNOSES  Final diagnoses:  Rash  Shingles       Marylene Land, NP 10/31/14 1108

## 2014-10-31 NOTE — ED Notes (Signed)
Rash on chest, itching, welt marks  for 2 days

## 2014-11-02 ENCOUNTER — Other Ambulatory Visit (INDEPENDENT_AMBULATORY_CARE_PROVIDER_SITE_OTHER): Payer: Medicare Other

## 2014-11-02 ENCOUNTER — Telehealth: Payer: Self-pay | Admitting: Internal Medicine

## 2014-11-02 DIAGNOSIS — Z7901 Long term (current) use of anticoagulants: Secondary | ICD-10-CM | POA: Diagnosis not present

## 2014-11-02 LAB — PROTIME-INR
INR: 3.8 ratio — ABNORMAL HIGH (ref 0.8–1.0)
PROTHROMBIN TIME: 40.9 s — AB (ref 9.6–13.1)

## 2014-11-02 NOTE — Telephone Encounter (Signed)
See if she can come in tomorrow at 2:00 (87min)

## 2014-11-02 NOTE — Telephone Encounter (Signed)
Pt stated that she was seen at urgent care on Saturday she also stated that they said it was either shingles or a rash they gave her a antibiotic . Needs to follow up appt.. Please advise where to sched

## 2014-11-03 ENCOUNTER — Encounter: Payer: Self-pay | Admitting: Internal Medicine

## 2014-11-03 ENCOUNTER — Ambulatory Visit (INDEPENDENT_AMBULATORY_CARE_PROVIDER_SITE_OTHER): Payer: Medicare Other | Admitting: Internal Medicine

## 2014-11-03 VITALS — BP 113/77 | HR 95 | Temp 98.0°F | Ht 67.5 in | Wt 237.1 lb

## 2014-11-03 DIAGNOSIS — Z7901 Long term (current) use of anticoagulants: Secondary | ICD-10-CM | POA: Diagnosis not present

## 2014-11-03 DIAGNOSIS — Z5181 Encounter for therapeutic drug level monitoring: Secondary | ICD-10-CM | POA: Diagnosis not present

## 2014-11-03 DIAGNOSIS — R21 Rash and other nonspecific skin eruption: Secondary | ICD-10-CM | POA: Diagnosis not present

## 2014-11-03 NOTE — Patient Instructions (Signed)
Hold coumadin today.  Recheck pt/inr tomorrow.

## 2014-11-03 NOTE — Progress Notes (Signed)
Pre-visit discussion using our clinic review tool. No additional management support is needed unless otherwise documented below in the visit note.  

## 2014-11-04 ENCOUNTER — Other Ambulatory Visit (INDEPENDENT_AMBULATORY_CARE_PROVIDER_SITE_OTHER): Payer: Medicare Other

## 2014-11-04 DIAGNOSIS — Z5181 Encounter for therapeutic drug level monitoring: Secondary | ICD-10-CM | POA: Diagnosis not present

## 2014-11-04 DIAGNOSIS — Z7901 Long term (current) use of anticoagulants: Secondary | ICD-10-CM

## 2014-11-04 LAB — PROTIME-INR
INR: 2.8 ratio — AB (ref 0.8–1.0)
PROTHROMBIN TIME: 30.4 s — AB (ref 9.6–13.1)

## 2014-11-05 ENCOUNTER — Ambulatory Visit: Payer: BLUE CROSS/BLUE SHIELD

## 2014-11-09 ENCOUNTER — Encounter: Payer: Self-pay | Admitting: Internal Medicine

## 2014-11-09 DIAGNOSIS — R21 Rash and other nonspecific skin eruption: Secondary | ICD-10-CM | POA: Insufficient documentation

## 2014-11-09 NOTE — Assessment & Plan Note (Signed)
Being treated for shingles.  Has improved.  Hold on making any changes.  Follow.

## 2014-11-09 NOTE — Assessment & Plan Note (Signed)
Hold coumadin today.  Recheck pt/inr tomorrow.  INR slightly elevated.

## 2014-11-09 NOTE — Progress Notes (Signed)
[name], female   DOB: 1940/08/12, 74 y.o.   MRN: [id number]   Subjective:    [name], female    DOB: [birth year], 74 y.o.   MRN: [id number]  HPI  Patient here as a work in to discuss a rash.  States started two weeks ago.  Noticed some itching.  Increased at the end of the week.  Had "whelps" over the weekend.  Went to [facility] in [place].  Diagnosed with possible shingles.  On medication.  States not as red.  Is still itching.  No fever.  States otherwise doing ok.    Past Medical History  Diagnosis Date  . Hypertension   . Diabetes mellitus   . Breast cancer     s/p lumpectomy 1992.  s/p chemo and xrt  . Hypercholesterolemia   . Atrial fibrillation   . Anemia   . GERD (gastroesophageal reflux disease)   . Pulmonary emboli   . Gastric ulcer    Past Surgical History  Procedure Laterality Date  . Breast lumpectomy      left breast   Family History  Problem Relation Age of Onset  . Heart disease Father   . Heart disease Brother     s/p CABG  . Colon cancer Neg Hx    Social History   Social History  . Marital Status: Married    Spouse Name: N/A  . Number of Children: N/A  . Years of Education: N/A   Social History Main Topics  . Smoking status: Former Research scientist (life sciences)  . Smokeless tobacco: Never Used  . Alcohol Use: No  . Drug Use: No  . Sexual Activity: Not Asked   Other Topics Concern  . None   Social History Narrative    Outpatient Encounter Prescriptions as of 11/03/2014  Medication Sig  . ferrous sulfate 325 (65 FE) MG tablet TAKE ONE (1) TABLET BY MOUTH EVERY DAY  . lisinopril (PRINIVIL,ZESTRIL) 20 MG tablet TAKE ONE (1) TABLET BY MOUTH EVERY DAY  . loratadine (CLARITIN) 10 MG tablet Take 10 mg by mouth daily.  . pantoprazole (PROTONIX) 40 MG tablet Take 40 mg by mouth daily.  . simvastatin (ZOCOR) 10 MG tablet TAKE ONE (1) TABLET EACH DAY  . [EXPIRED] valACYclovir (VALTREX) 1000 MG tablet Take 1 tablet (1,000 mg total)  by mouth 3 (three) times daily. For 7 days  . warfarin (COUMADIN) 2.5 MG tablet TAKE 2 TABLETS ON TUESDAY, THURSDAY, & SATURDAY AND TAKE 1 TABLET ON ALL OTHER DAYS  . [DISCONTINUED] Diphenhyd-Hydrocort-Nystatin (FIRST-DUKES MOUTHWASH) SUSP 5-10cc's swish and spit tid   No facility-administered encounter medications on file as of 11/03/2014.    Review of Systems  Constitutional: Negative for fever, appetite change and unexpected weight change.  HENT: Negative for congestion and sinus pressure.   Respiratory: Negative for cough, chest tightness and shortness of breath.   Cardiovascular: Negative for chest pain, palpitations and leg swelling.  Gastrointestinal: Negative for nausea, vomiting, abdominal pain and diarrhea.  Skin: Negative for wound.       Rash - over left breast.         Objective:    Physical Exam  Constitutional: She appears well-developed and well-nourished. No distress.  HENT:  Nose: Nose normal.  Mouth/Throat: Oropharynx is clear and moist.  Neck: Neck supple. No thyromegaly present.  Cardiovascular: Normal rate and regular rhythm.   Pulmonary/Chest: Breath sounds normal. No respiratory distress. She has no wheezes.  Abdominal: Soft. Bowel sounds  are normal. There is no tenderness.  Lymphadenopathy:    She has no cervical adenopathy.  Skin:  Small erythematous based lesions.  Unclear if shingles. No vesicles.      BP 113/77 mmHg  Pulse 95  Temp(Src) 98 F (36.7 C) (Oral)  Ht 5' 7.5" (1.715 m)  Wt 237 lb 2 oz (107.559 kg)  BMI 36.57 kg/m2  SpO2 94% Wt Readings from Last 3 Encounters:  11/03/14 237 lb 2 oz (107.559 kg)  10/31/14 235 lb (106.595 kg)  09/16/14 236 lb 4 oz (107.162 kg)     Lab Results  Component Value Date   WBC 6.1 10/02/2014   HGB 14.3 10/02/2014   HCT 42.7 10/02/2014   PLT 214.0 10/02/2014   GLUCOSE 107* 10/02/2014   CHOL 145 10/02/2014   TRIG 106.0 10/02/2014   HDL 53.50 10/02/2014   LDLCALC 71 10/02/2014   ALT 17 10/02/2014     AST 22 10/02/2014   NA 136 10/02/2014   K 4.9 10/02/2014   CL 102 10/02/2014   CREATININE 0.91 10/02/2014   BUN 16 10/02/2014   CO2 28 10/02/2014   TSH 3.31 06/10/2014   INR 2.8* 11/04/2014   HGBA1C 5.8 10/02/2014   MICROALBUR 0.1 02/02/2014       Assessment & Plan:   Problem List Items Addressed This Visit    Chronic anticoagulation    Hold coumadin today.  Recheck pt/inr tomorrow.  INR slightly elevated.        Rash    Being treated for shingles.  Has improved.  Hold on making any changes.  Follow.         Other Visit Diagnoses    Anticoagulated on Coumadin    -  Primary    Relevant Orders    Protime-INR (Completed)        Einar Pheasant, MD

## 2014-11-10 ENCOUNTER — Other Ambulatory Visit: Payer: Self-pay | Admitting: Internal Medicine

## 2014-11-12 ENCOUNTER — Other Ambulatory Visit (INDEPENDENT_AMBULATORY_CARE_PROVIDER_SITE_OTHER): Payer: Medicare Other

## 2014-11-12 DIAGNOSIS — Z7901 Long term (current) use of anticoagulants: Secondary | ICD-10-CM

## 2014-11-12 DIAGNOSIS — Z23 Encounter for immunization: Secondary | ICD-10-CM | POA: Diagnosis not present

## 2014-11-12 LAB — PROTIME-INR
INR: 2.8 ratio — AB (ref 0.8–1.0)
Prothrombin Time: 29.9 s — ABNORMAL HIGH (ref 9.6–13.1)

## 2014-11-19 ENCOUNTER — Other Ambulatory Visit (INDEPENDENT_AMBULATORY_CARE_PROVIDER_SITE_OTHER): Payer: Medicare Other

## 2014-11-19 DIAGNOSIS — Z7901 Long term (current) use of anticoagulants: Secondary | ICD-10-CM | POA: Diagnosis not present

## 2014-11-19 LAB — PROTIME-INR
INR: 2.8 ratio — AB (ref 0.8–1.0)
PROTHROMBIN TIME: 30.7 s — AB (ref 9.6–13.1)

## 2014-11-26 ENCOUNTER — Ambulatory Visit
Admission: RE | Admit: 2014-11-26 | Discharge: 2014-11-26 | Disposition: A | Discharge status: Home / Self Care | Payer: Medicare Other | Source: Ambulatory Visit | Attending: Internal Medicine | Admitting: Internal Medicine

## 2014-11-26 DIAGNOSIS — Z1231 Encounter for screening mammogram for malignant neoplasm of breast: Secondary | ICD-10-CM | POA: Insufficient documentation

## 2014-11-26 DIAGNOSIS — Z1239 Encounter for other screening for malignant neoplasm of breast: Secondary | ICD-10-CM

## 2014-11-30 ENCOUNTER — Other Ambulatory Visit: Payer: BLUE CROSS/BLUE SHIELD

## 2014-11-30 ENCOUNTER — Other Ambulatory Visit (INDEPENDENT_AMBULATORY_CARE_PROVIDER_SITE_OTHER): Payer: Medicare Other

## 2014-11-30 DIAGNOSIS — Z7901 Long term (current) use of anticoagulants: Secondary | ICD-10-CM | POA: Diagnosis not present

## 2014-11-30 LAB — PROTIME-INR
INR: 4.7 ratio — AB (ref 0.8–1.0)
Prothrombin Time: 50.4 s — ABNORMAL HIGH (ref 9.6–13.1)

## 2014-12-02 ENCOUNTER — Other Ambulatory Visit (INDEPENDENT_AMBULATORY_CARE_PROVIDER_SITE_OTHER): Payer: Medicare Other

## 2014-12-02 DIAGNOSIS — Z7901 Long term (current) use of anticoagulants: Secondary | ICD-10-CM | POA: Diagnosis not present

## 2014-12-02 LAB — PROTIME-INR
INR: 2.46 — ABNORMAL HIGH (ref <1.50)
Prothrombin Time: 27 seconds — ABNORMAL HIGH (ref 11.6–15.2)

## 2014-12-02 NOTE — Addendum Note (Signed)
Addended by: Karlene Einstein D on: 12/02/2014 03:13 PM   Modules accepted: Orders

## 2014-12-03 ENCOUNTER — Ambulatory Visit: Payer: Medicare Other

## 2014-12-03 ENCOUNTER — Ambulatory Visit (INDEPENDENT_AMBULATORY_CARE_PROVIDER_SITE_OTHER): Payer: Medicare Other | Admitting: Podiatry

## 2014-12-03 DIAGNOSIS — B351 Tinea unguium: Secondary | ICD-10-CM | POA: Diagnosis not present

## 2014-12-03 DIAGNOSIS — M79676 Pain in unspecified toe(s): Secondary | ICD-10-CM | POA: Diagnosis not present

## 2014-12-03 NOTE — Progress Notes (Signed)
Subjective: 74 y.o. returns the office today for painful, elongated, thickened toenails which she is unable to trim herself. Denies any redness or drainage around the nails. Denies any acute changes since last appointment and no new complaints today. Denies any systemic complaints such as fevers, chills, nausea, vomiting.   Objective: AAO 3, NAD DP/PT pulses palpable, CRT less than 3 seconds Nails hypertrophic, dystrophic, elongated, brittle, discolored 10. There is tenderness overlying the nails 15 bilaterally. There is no surrounding erythema or drainage along the nail sites. No open lesions or pre-ulcerative lesions are identified. No other areas of tenderness bilateral lower extremities. No overlying edema, erythema, increased warmth. No pain with calf compression, swelling, warmth, erythema.  Assessment: Patient presents with symptomatic onychomycosis  Plan: -Treatment options including alternatives, risks, complications were discussed -Nails sharply debrided 10 without complication/bleeding. -Discussed daily foot inspection. If there are any changes, to call the office immediately.  -Follow-up in 3 months or sooner if any problems are to arise. In the meantime, encouraged to call the office with any questions, concerns, changes symptoms. *she likes her nails cut short   Celesta Gentile, DPM

## 2014-12-04 DIAGNOSIS — H2513 Age-related nuclear cataract, bilateral: Secondary | ICD-10-CM | POA: Diagnosis not present

## 2014-12-04 LAB — HM DIABETES EYE EXAM

## 2014-12-07 ENCOUNTER — Other Ambulatory Visit (INDEPENDENT_AMBULATORY_CARE_PROVIDER_SITE_OTHER): Payer: Medicare Other

## 2014-12-07 DIAGNOSIS — Z7901 Long term (current) use of anticoagulants: Secondary | ICD-10-CM | POA: Diagnosis not present

## 2014-12-07 LAB — PROTIME-INR
INR: 2.8 ratio — AB (ref 0.8–1.0)
PROTHROMBIN TIME: 29.9 s — AB (ref 9.6–13.1)

## 2014-12-08 ENCOUNTER — Encounter: Payer: Self-pay | Admitting: Internal Medicine

## 2014-12-08 ENCOUNTER — Ambulatory Visit (INDEPENDENT_AMBULATORY_CARE_PROVIDER_SITE_OTHER): Payer: Medicare Other | Admitting: Family Medicine

## 2014-12-08 DIAGNOSIS — Z5189 Encounter for other specified aftercare: Secondary | ICD-10-CM | POA: Diagnosis not present

## 2014-12-08 NOTE — Progress Notes (Signed)
Subjective: Patient was in office 12/07/14 for labs and informed the staff that she had cut her left middle finger on a piece of glass. She irrigated the wound and cleaned it thoroughly and applied a Band-Aid.   When she arrived in the office for her labs she was bleeding through her dressing. Lorane Gell and I took a look at the wound.  There was mild bleeding. Wound was not deep enough for sutures. Dermabond was applied.  She presents to today for re-evaluation as she has had recent bleeding.  Objective: Gen.: Well-appearing elderly female in no acute distress. Skin: Small superficial laceration (linear) of the middle finger of the left hand. No bleeding noted. Appears to be healing well. No drainage or discharge. No surrounding erythema. No signs of infection.  A/P: Superficial laceration - Healing well. Wound was dressed with Tefla and Coban. - Advised daily dressing changes.

## 2014-12-14 ENCOUNTER — Other Ambulatory Visit (INDEPENDENT_AMBULATORY_CARE_PROVIDER_SITE_OTHER): Payer: Medicare Other

## 2014-12-14 DIAGNOSIS — Z7901 Long term (current) use of anticoagulants: Secondary | ICD-10-CM | POA: Diagnosis not present

## 2014-12-14 LAB — PROTIME-INR
INR: 2.9 ratio — AB (ref 0.8–1.0)
Prothrombin Time: 31.2 s — ABNORMAL HIGH (ref 9.6–13.1)

## 2014-12-22 ENCOUNTER — Other Ambulatory Visit (INDEPENDENT_AMBULATORY_CARE_PROVIDER_SITE_OTHER): Payer: Medicare Other

## 2014-12-22 DIAGNOSIS — Z7901 Long term (current) use of anticoagulants: Secondary | ICD-10-CM

## 2014-12-22 LAB — PROTIME-INR
INR: 2.7 ratio — AB (ref 0.8–1.0)
PROTHROMBIN TIME: 29.1 s — AB (ref 9.6–13.1)

## 2014-12-23 ENCOUNTER — Telehealth: Payer: Self-pay | Admitting: *Deleted

## 2014-12-23 NOTE — Telephone Encounter (Signed)
Patient coming in on 01/05/2015.

## 2014-12-23 NOTE — Telephone Encounter (Signed)
-----   Message from Einar Pheasant, MD sent at 12/23/2014 12:27 AM EDT ----- Notify pt that her INR is ok.  Continue on the same dose of coumadin and recheck pt/inr in 10 days.

## 2015-01-05 ENCOUNTER — Other Ambulatory Visit: Payer: Medicare Other | Admitting: Internal Medicine

## 2015-01-05 ENCOUNTER — Other Ambulatory Visit (INDEPENDENT_AMBULATORY_CARE_PROVIDER_SITE_OTHER): Payer: Medicare Other

## 2015-01-05 DIAGNOSIS — Z7901 Long term (current) use of anticoagulants: Secondary | ICD-10-CM

## 2015-01-05 LAB — PROTIME-INR
INR: 2.4 ratio — AB (ref 0.8–1.0)
PROTHROMBIN TIME: 26.4 s — AB (ref 9.6–13.1)

## 2015-01-18 ENCOUNTER — Encounter: Payer: Self-pay | Admitting: Internal Medicine

## 2015-01-18 ENCOUNTER — Ambulatory Visit (INDEPENDENT_AMBULATORY_CARE_PROVIDER_SITE_OTHER): Payer: Medicare Other | Admitting: Internal Medicine

## 2015-01-18 VITALS — BP 120/78 | HR 87 | Temp 97.8°F | Resp 18 | Ht 67.5 in | Wt 235.0 lb

## 2015-01-18 DIAGNOSIS — I10 Essential (primary) hypertension: Secondary | ICD-10-CM | POA: Diagnosis not present

## 2015-01-18 DIAGNOSIS — C50919 Malignant neoplasm of unspecified site of unspecified female breast: Secondary | ICD-10-CM

## 2015-01-18 DIAGNOSIS — Z7901 Long term (current) use of anticoagulants: Secondary | ICD-10-CM | POA: Diagnosis not present

## 2015-01-18 DIAGNOSIS — E7800 Pure hypercholesterolemia, unspecified: Secondary | ICD-10-CM | POA: Diagnosis not present

## 2015-01-18 DIAGNOSIS — Z5181 Encounter for therapeutic drug level monitoring: Secondary | ICD-10-CM | POA: Diagnosis not present

## 2015-01-18 DIAGNOSIS — E119 Type 2 diabetes mellitus without complications: Secondary | ICD-10-CM | POA: Diagnosis not present

## 2015-01-18 DIAGNOSIS — F4323 Adjustment disorder with mixed anxiety and depressed mood: Secondary | ICD-10-CM

## 2015-01-18 LAB — PROTIME-INR
INR: 2.8 ratio — AB (ref 0.8–1.0)
PROTHROMBIN TIME: 30 s — AB (ref 9.6–13.1)

## 2015-01-18 NOTE — Progress Notes (Signed)
Pre-visit discussion using our clinic review tool. No additional management support is needed unless otherwise documented below in the visit note.  

## 2015-01-18 NOTE — Progress Notes (Signed)
Patient ID: Jo Ann Rowland [place], female   DOB: 02-25-1941, 74 y.o.   MRN: [id number]   Subjective:    Patient ID: Jo Ann Rowland [place], female    DOB: April 09, 1940, 74 y.o.   MRN: [id number]  HPI  Patient with past history of diabetes, hypercholesterolemia, hypertension, breast cancer and history of bilateral pulmonary emboli.  She comes in today to follow up on these issues.  She is staying active.  No cardiac symptoms with increased activity or exertion.  Feels she is handling stress relatively well.  Does not feel needs any further intervention.  Breathing is stable.  No nausea or vomiting.  Bowels stable.  No urinary issues.  Sees podiatry every two months.     Past Medical History  Diagnosis Date  . Hypertension   . Diabetes mellitus ([place])   . Breast cancer ([place])     s/p lumpectomy 1992.  s/p chemo and xrt  . Hypercholesterolemia   . Atrial fibrillation ([place])   . Anemia   . GERD (gastroesophageal reflux disease)   . Pulmonary emboli ([place])   . Gastric ulcer    Past Surgical History  Procedure Laterality Date  . Breast lumpectomy  03/06/1990    left breast   Family History  Problem Relation Age of Onset  . Heart disease Father   . Heart disease Brother     s/p CABG  . Colon cancer Neg Hx    Social History   Social History  . Marital Status: Married    Spouse Name: N/A  . Number of Children: N/A  . Years of Education: N/A   Social History Main Topics  . Smoking status: Former Research scientist (life sciences)  . Smokeless tobacco: Never Used  . Alcohol Use: No  . Drug Use: No  . Sexual Activity: Not Asked   Other Topics Concern  . None   Social History Narrative    Outpatient Encounter Prescriptions as of 01/18/2015  Medication Sig  . ferrous sulfate 325 (65 FE) MG tablet TAKE ONE (1) TABLET EACH DAY  . lisinopril (PRINIVIL,ZESTRIL) 20 MG tablet TAKE ONE (1) TABLET BY MOUTH EVERY DAY  . loratadine (CLARITIN) 10 MG tablet Take 10 mg by mouth daily.  . pantoprazole (PROTONIX) 40 MG  tablet Take 40 mg by mouth daily.  . simvastatin (ZOCOR) 10 MG tablet TAKE ONE (1) TABLET EACH DAY  . warfarin (COUMADIN) 2.5 MG tablet TAKE 2 TABLETS ON TUESDAY, THURSDAY, & SATURDAY AND TAKE 1 TABLET ON ALL OTHER DAYS   No facility-administered encounter medications on file as of 01/18/2015.    Review of Systems  Constitutional: Negative for fatigue and unexpected weight change.  HENT: Negative for congestion and sinus pressure.   Eyes: Negative for pain and discharge.  Respiratory: Negative for cough, chest tightness and shortness of breath.   Cardiovascular: Negative for chest pain, palpitations and leg swelling.  Gastrointestinal: Negative for vomiting, abdominal pain and diarrhea.  Genitourinary: Negative for dysuria and difficulty urinating.  Musculoskeletal: Negative for back pain and joint swelling.  Skin: Negative for color change and rash.  Neurological: Negative for dizziness, light-headedness and headaches.  Psychiatric/Behavioral: Negative for dysphoric mood and agitation.       Objective:    Physical Exam  Constitutional: She appears well-developed and well-nourished. No distress.  HENT:  Nose: Nose normal.  Mouth/Throat: Oropharynx is clear and moist.  Eyes: Conjunctivae are normal. Right eye exhibits no discharge. Left eye exhibits no discharge.  Neck: Neck supple. No thyromegaly present.  Cardiovascular: Normal rate and regular rhythm.   Pulmonary/Chest: Breath sounds normal. No respiratory distress. She has no wheezes.  Abdominal: Soft. Bowel sounds are normal. There is no tenderness.  Musculoskeletal: She exhibits no edema or tenderness.  Lymphadenopathy:    She has no cervical adenopathy.  Skin: No rash noted. No erythema.  Psychiatric: She has a normal mood and affect. Her behavior is normal.    BP 120/78 mmHg  Pulse 87  Temp(Src) 97.8 F (36.6 C) (Oral)  Resp 18  Ht 5' 7.5" (1.715 m)  Wt 235 lb (106.595 kg)  BMI 36.24 kg/m2  SpO2 96% Wt  Readings from Last 3 Encounters:  01/18/15 235 lb (106.595 kg)  11/03/14 237 lb 2 oz (107.559 kg)  10/31/14 235 lb (106.595 kg)     Lab Results  Component Value Date   WBC 6.1 10/02/2014   HGB 14.3 10/02/2014   HCT 42.7 10/02/2014   PLT 214.0 10/02/2014   GLUCOSE 107* 10/02/2014   CHOL 145 10/02/2014   TRIG 106.0 10/02/2014   HDL 53.50 10/02/2014   LDLCALC 71 10/02/2014   ALT 17 10/02/2014   AST 22 10/02/2014   NA 136 10/02/2014   K 4.9 10/02/2014   CL 102 10/02/2014   CREATININE 0.91 10/02/2014   BUN 16 10/02/2014   CO2 28 10/02/2014   TSH 3.31 06/10/2014   INR 2.8* 01/18/2015   HGBA1C 5.8 10/02/2014   MICROALBUR 0.1 02/02/2014    Mm Digital Screening Bilateral  11/27/2014  CLINICAL DATA:  Screening. EXAM: DIGITAL SCREENING BILATERAL MAMMOGRAM WITH CAD COMPARISON:  Previous exam(s). ACR Breast Density Category b: There are scattered areas of fibroglandular density. FINDINGS: There are no findings suspicious for malignancy. Images were processed with CAD. IMPRESSION: No mammographic evidence of malignancy. A result letter of this screening mammogram will be mailed directly to the patient. RECOMMENDATION: Screening mammogram in one year. (Code:SM-B-01Y) BI-RADS CATEGORY  1: Negative. Electronically Signed   By: Ammie Ferrier M.D.   On: 11/27/2014 07:47       Assessment & Plan:   Problem List Items Addressed This Visit    Adjustment disorder with mixed anxiety and depressed mood    She is doing better overall.  Has good support.  Going [place] the flea market more.  Follow.        Breast cancer ([place])    Mammogram 11/27/14 - Birads I.        Chronic anticoagulation    Check pt/inr today.       Diabetes mellitus ([place])    Low carb diet and exercise.  Follow met b and a1c.  Keep up to date with eye checks.        Hypercholesteremia    Low cholesterol diet and exercise.  Follow lipid panel and liver function tests.  On simvastatin.        Hypertension    Blood  pressure under good control.  Continue same medication regimen.  Follow pressures.  Follow metabolic panel.         Other Visit Diagnoses    Anticoagulated on Coumadin    -  Primary    Relevant Orders    Protime-INR (Completed)        Einar Pheasant, MD

## 2015-01-24 ENCOUNTER — Encounter: Payer: Self-pay | Admitting: Internal Medicine

## 2015-01-24 NOTE — Assessment & Plan Note (Signed)
Mammogram 11/27/14 - Birads I.

## 2015-01-24 NOTE — Assessment & Plan Note (Signed)
Low carb diet and exercise.  Follow met b and a1c.  Keep up to date with eye checks.

## 2015-01-24 NOTE — Assessment & Plan Note (Signed)
Check pt/inr today.  

## 2015-01-24 NOTE — Assessment & Plan Note (Signed)
She is doing better overall.  Has good support.  Going to the flea market more.  Follow.

## 2015-01-24 NOTE — Assessment & Plan Note (Signed)
Low cholesterol diet and exercise.  Follow lipid panel and liver function tests.  On simvastatin.   

## 2015-01-24 NOTE — Assessment & Plan Note (Signed)
Blood pressure under good control.  Continue same medication regimen.  Follow pressures.  Follow metabolic panel.   

## 2015-02-02 ENCOUNTER — Other Ambulatory Visit (INDEPENDENT_AMBULATORY_CARE_PROVIDER_SITE_OTHER): Payer: Medicare Other

## 2015-02-02 DIAGNOSIS — Z7901 Long term (current) use of anticoagulants: Secondary | ICD-10-CM | POA: Diagnosis not present

## 2015-02-02 LAB — PROTIME-INR
INR: 2.3 ratio — AB (ref 0.8–1.0)
PROTHROMBIN TIME: 24.9 s — AB (ref 9.6–13.1)

## 2015-02-09 ENCOUNTER — Encounter: Payer: Self-pay | Admitting: Sports Medicine

## 2015-02-09 ENCOUNTER — Ambulatory Visit: Payer: Medicare Other

## 2015-02-09 ENCOUNTER — Ambulatory Visit (INDEPENDENT_AMBULATORY_CARE_PROVIDER_SITE_OTHER): Payer: Medicare Other | Admitting: Sports Medicine

## 2015-02-09 DIAGNOSIS — M79676 Pain in unspecified toe(s): Secondary | ICD-10-CM

## 2015-02-09 DIAGNOSIS — E119 Type 2 diabetes mellitus without complications: Secondary | ICD-10-CM

## 2015-02-09 DIAGNOSIS — B351 Tinea unguium: Secondary | ICD-10-CM

## 2015-02-09 NOTE — Progress Notes (Signed)
[name], female   DOB: [birth year], 74 y.o.   MRN: [id number] Subjective: Anita Walker is a 74 y.o. female patient with history of type 2 diabetes who presents to office today complaining of long, painful nails  while ambulating in shoes; unable to trim. Patient states that the glucose reading this morning was 118 mg/dl. Patient denies any new changes in medication or new problems. Patient denies any new cramping, numbness, burning or tingling in the legs.  Patient Active Problem List   Diagnosis Date Noted  . Rash 11/09/2014  . Thrush, oral 09/17/2014  . Health care maintenance 05/23/2014  . Adjustment disorder with mixed anxiety and depressed mood 07/20/2013  . UTI (urinary tract infection) 07/20/2013  . Chronic anticoagulation 04/11/2013  . Ecchymosis 04/11/2013  . Cough 04/10/2012  . Hypertension 01/01/2012  . Diabetes mellitus ([place]) 01/01/2012  . Hypercholesteremia 01/01/2012  . Breast cancer ([place]) 01/01/2012   Current Outpatient Prescriptions on File Prior to Visit  Medication Sig Dispense Refill  . ferrous sulfate 325 (65 FE) MG tablet TAKE ONE (1) TABLET EACH DAY 90 tablet 1  . lisinopril (PRINIVIL,ZESTRIL) 20 MG tablet TAKE ONE (1) TABLET BY MOUTH EVERY DAY 90 tablet 1  . loratadine (CLARITIN) 10 MG tablet Take 10 mg by mouth daily.    . pantoprazole (PROTONIX) 40 MG tablet Take 40 mg by mouth daily.    . simvastatin (ZOCOR) 10 MG tablet TAKE ONE (1) TABLET EACH DAY 90 tablet 1  . warfarin (COUMADIN) 2.5 MG tablet TAKE 2 TABLETS ON TUESDAY, THURSDAY, & SATURDAY AND TAKE 1 TABLET ON ALL OTHER DAYS 120 tablet 3   No current facility-administered medications on file prior to visit.   Allergies  Allergen Reactions  . Penicillins Other (See Comments)    Had a reaction a long time ago.  Marland Kitchen Penicillin V Potassium Nausea And Vomiting   Labs: HEMOGLOBIN A1C- no recent labs on file  Objective: General: Patient is awake, alert, and oriented x 3 and  in no acute distress.  Integument: Skin is warm, dry and supple bilateral. Nails are tender, long, thickened and  dystrophic with subungual debris, consistent with onychomycosis, 1-5 bilateral. No signs of infection. No open lesions or preulcerative lesions present bilateral. Remaining integument unremarkable.  Vasculature:  Dorsalis Pedis pulse 1/4 bilateral. Posterior Tibial pulse  1/4 bilateral.  Capillary fill time <3 sec 1-5 bilateral. Scant hair growth to the level of the digits. Temperature gradient within normal limits. Mild varicosities present bilateral. No edema present bilateral.   Neurology: The patient has intact sensation measured with a 5.07/10g Semmes Weinstein Monofilament at all pedal sites bilateral . Vibratory sensation slightly diminished bilateral with tuning fork. No Babinski sign present bilateral.   Musculoskeletal: No gross pedal deformities noted bilateral. Muscular strength 5/5 in all lower extremity muscular groups bilateral without pain or limitation on range of motion . No tenderness with calf compression bilateral.  Assessment and Plan: Problem List Items Addressed This Visit    None    Visit Diagnoses    Dermatophytosis of nail    -  Primary    Pain of toe, unspecified laterality        Diabetes mellitus without complication ([place])          -Examined patient. -Discussed and educated patient on diabetic foot care, especially with  regards to the vascular, neurological and musculoskeletal systems.  -Stressed the importance of good glycemic control and the detriment of not  controlling  glucose levels in relation to the foot. -Mechanically debrided all nails 1-5 bilateral using sterile nail nipper and filed with dremel without incident  -Answered all patient questions -Patient to return in 2-3 months for at risk foot care  -Patient advised to call the office if any problems or questions arise in the meantime.  Landis Martins, DPM

## 2015-02-24 ENCOUNTER — Other Ambulatory Visit (INDEPENDENT_AMBULATORY_CARE_PROVIDER_SITE_OTHER): Payer: Medicare Other

## 2015-02-24 DIAGNOSIS — Z7901 Long term (current) use of anticoagulants: Secondary | ICD-10-CM | POA: Diagnosis not present

## 2015-02-24 LAB — PROTIME-INR
INR: 3.9 ratio — AB (ref 0.8–1.0)
PROTHROMBIN TIME: 42.7 s — AB (ref 9.6–13.1)

## 2015-02-25 ENCOUNTER — Telehealth: Payer: Self-pay | Admitting: Internal Medicine

## 2015-02-25 ENCOUNTER — Other Ambulatory Visit: Payer: Self-pay | Admitting: Internal Medicine

## 2015-02-25 DIAGNOSIS — Z7901 Long term (current) use of anticoagulants: Secondary | ICD-10-CM

## 2015-02-25 NOTE — Telephone Encounter (Signed)
Left pt another voicemail informing her to please hold her Coumadin tonight. She has lab appt for tomorrow morning.

## 2015-02-25 NOTE — Telephone Encounter (Signed)
Need to make sure she holds tonight and have her come in for pt/inr tomorrow.

## 2015-02-25 NOTE — Telephone Encounter (Signed)
Pt wanted Dr. Nicki Reaper to know she is taking her.warfarin (COUMADIN) 2.5 MG tablet as per scribed.  Thank you

## 2015-02-25 NOTE — Telephone Encounter (Signed)
Patient called to let you know her dosage

## 2015-02-25 NOTE — Progress Notes (Signed)
Order placed for f/u pt/inr

## 2015-02-26 ENCOUNTER — Other Ambulatory Visit (INDEPENDENT_AMBULATORY_CARE_PROVIDER_SITE_OTHER): Payer: Medicare Other

## 2015-02-26 DIAGNOSIS — Z7901 Long term (current) use of anticoagulants: Secondary | ICD-10-CM | POA: Diagnosis not present

## 2015-02-26 DIAGNOSIS — Z5181 Encounter for therapeutic drug level monitoring: Secondary | ICD-10-CM | POA: Diagnosis not present

## 2015-02-26 LAB — PROTIME-INR
INR: 2.7 ratio — AB (ref 0.8–1.0)
PROTHROMBIN TIME: 28.6 s — AB (ref 9.6–13.1)

## 2015-03-04 ENCOUNTER — Other Ambulatory Visit (INDEPENDENT_AMBULATORY_CARE_PROVIDER_SITE_OTHER): Payer: Medicare Other

## 2015-03-04 DIAGNOSIS — Z7901 Long term (current) use of anticoagulants: Secondary | ICD-10-CM | POA: Diagnosis not present

## 2015-03-04 LAB — PROTIME-INR
INR: 2.2 ratio — ABNORMAL HIGH (ref 0.8–1.0)
Prothrombin Time: 23.2 s — ABNORMAL HIGH (ref 9.6–13.1)

## 2015-03-15 ENCOUNTER — Other Ambulatory Visit (INDEPENDENT_AMBULATORY_CARE_PROVIDER_SITE_OTHER): Payer: Medicare Other

## 2015-03-15 DIAGNOSIS — Z7901 Long term (current) use of anticoagulants: Secondary | ICD-10-CM

## 2015-03-15 LAB — PROTIME-INR
INR: 2.5 ratio — AB (ref 0.8–1.0)
Prothrombin Time: 26.9 s — ABNORMAL HIGH (ref 9.6–13.1)

## 2015-03-22 ENCOUNTER — Other Ambulatory Visit: Payer: Self-pay | Admitting: Internal Medicine

## 2015-03-30 ENCOUNTER — Other Ambulatory Visit (INDEPENDENT_AMBULATORY_CARE_PROVIDER_SITE_OTHER): Payer: Medicare Other

## 2015-03-30 DIAGNOSIS — Z7901 Long term (current) use of anticoagulants: Secondary | ICD-10-CM

## 2015-03-30 LAB — PROTIME-INR
INR: 2.6 ratio — ABNORMAL HIGH (ref 0.8–1.0)
PROTHROMBIN TIME: 27.7 s — AB (ref 9.6–13.1)

## 2015-04-09 ENCOUNTER — Encounter: Payer: Self-pay | Admitting: Family Medicine

## 2015-04-09 ENCOUNTER — Ambulatory Visit (INDEPENDENT_AMBULATORY_CARE_PROVIDER_SITE_OTHER): Payer: Medicare Other | Admitting: Family Medicine

## 2015-04-09 VITALS — BP 132/66 | HR 93 | Temp 98.4°F | Ht 67.5 in | Wt 239.2 lb

## 2015-04-09 DIAGNOSIS — J069 Acute upper respiratory infection, unspecified: Secondary | ICD-10-CM

## 2015-04-09 MED ORDER — FLUTICASONE PROPIONATE 50 MCG/ACT NA SUSP: 2.0000 | Freq: Every day | NASAL | Status: DC

## 2015-04-09 MED ORDER — LORATADINE 10 MG PO TABS: 10.0000 mg | ORAL_TABLET | Freq: Every day | ORAL | Status: DC

## 2015-04-09 NOTE — Assessment & Plan Note (Signed)
Patient's symptoms most consistent with viral upper respiratory infection. Could be related to allergies. Advised that this is unlikely to be related to bacterial infection. She has no focal findings to indicate bacterial infection. Discussed supportive treatment. Start on Claritin and Flonase. Continue Nettie pot. Given return precautions.

## 2015-04-09 NOTE — Progress Notes (Signed)
[name], female   DOB: 1940/12/09, 75 y.o.   MRN: [id number]  Tommi Rumps, MD Phone: [phone]  Anita Walker is a 75 y.o. female who presents today for same-day visit.  Patient reports 1 day of nasal congestion and postnasal drip with mild cough. Notes her head feels congested. No fevers. No productive cough. No shortness of breath. No ear pain. Notes she had clear mucus out of her nose. She has no sick contacts. She's not taken any medicines for this. She is to [employer] pot with mild benefit.  PMH: Former smoker.   ROS see history of present illness  Objective  Physical Exam Filed Vitals:   04/09/15 1307  BP: 132/66  Pulse: 93  Temp: 98.4 F (36.9 C)    BP Readings from Last 3 Encounters:  04/09/15 132/66  01/18/15 120/78  11/03/14 113/77   Wt Readings from Last 3 Encounters:  04/09/15 239 lb 3.2 oz (108.5 kg)  01/18/15 235 lb (106.595 kg)  11/03/14 237 lb 2 oz (107.559 kg)    Physical Exam  Constitutional: She is well-developed, well-nourished, and in no distress.  HENT:  Head: Normocephalic and atraumatic.  Right Ear: External ear normal.  Left Ear: External ear normal.  Mouth/Throat: Oropharynx is clear and moist. No oropharyngeal exudate.  Normal TMs bilaterally  Eyes: Conjunctivae are normal. Pupils are equal, round, and reactive to light.  Neck: Neck supple.  Cardiovascular: Normal rate and regular rhythm.   Pulmonary/Chest: Effort normal and breath sounds normal. No respiratory distress. She has no wheezes. She has no rales.  Lymphadenopathy:    She has no cervical adenopathy.  Neurological: She is alert. Gait normal.  Skin: Skin is warm and dry. She is not diaphoretic.     Assessment/Plan: Please see individual problem list.  Viral upper respiratory illness Patient's symptoms most consistent with viral upper respiratory infection. Could be related to allergies. Advised that this is unlikely to be related to  bacterial infection. She has no focal findings to indicate bacterial infection. Discussed supportive treatment. Start on Claritin and Flonase. Continue Nettie pot. Given return precautions.    Meds ordered this encounter  Medications  . DISCONTD: fluticasone (FLONASE) 50 MCG/ACT nasal spray    Sig: Place 2 sprays into both nostrils daily.    Dispense:  1 g    Refill:  0  . fluticasone (FLONASE) 50 MCG/ACT nasal spray    Sig: Place 2 sprays into both nostrils daily.    Dispense:  1 g    Refill:  0  . loratadine (CLARITIN) 10 MG tablet    Sig: Take 1 tablet (10 mg total) by mouth daily.    Dispense:  30 tablet    Refill:  1    Tommi Rumps

## 2015-04-09 NOTE — Progress Notes (Signed)
Pre visit review using our clinic review tool, if applicable. No additional management support is needed unless otherwise documented below in the visit note. 

## 2015-04-09 NOTE — Patient Instructions (Signed)
Nice to meet you. Your symptoms are likely related to a viral upper respiratory illness. They could also be related to allergies. There is no sign of a bacterial illness. Treatment is with Claritin and Flonase which she can get over-the-counter. You should use 2 sprays in each nostril a day of the Flonase. If you develop shortness of breath, cough productive of blood, fevers, or any new or changing symptoms please seek medical attention.

## 2015-04-13 ENCOUNTER — Ambulatory Visit (INDEPENDENT_AMBULATORY_CARE_PROVIDER_SITE_OTHER): Payer: Medicare Other | Admitting: Family Medicine

## 2015-04-13 VITALS — BP 124/76 | HR 89 | Temp 97.6°F | Ht 67.5 in | Wt 236.2 lb

## 2015-04-13 DIAGNOSIS — J069 Acute upper respiratory infection, unspecified: Secondary | ICD-10-CM

## 2015-04-13 MED ORDER — PREDNISONE 10 MG PO TABS: ORAL_TABLET | ORAL | Status: DC

## 2015-04-13 NOTE — Progress Notes (Signed)
[name], female   DOB: November 10, 1940, 75 y.o.   MRN: [id number]  Tommi Rumps, MD Phone: [phone]  Anita Walker is a 75 y.o. female who presents today for same-day visit.  Patient presents for continued cough and nasal and sinus congestion. She notes her head feels stopped up. She is blowing clear mucus out of her nose. She is coughing up clear mucus. She is not having any shortness of breath or fevers. She does have some ear fullness with this. She's been using the nasal spray and allergy pill that was prescribed previously. She has also been using the Nettie pot.   PMH: Former smoker   ROS see history of present illness  Objective  Physical Exam Filed Vitals:   04/13/15 1049  BP: 124/76  Pulse: 89  Temp: 97.6 F (36.4 C)   Physical Exam  Constitutional: She is well-developed, well-nourished, and in no distress.  HENT:  Head: Normocephalic and atraumatic.  Right Ear: External ear normal.  Left Ear: External ear normal.  Mild posterior oropharyngeal erythema, no exudates, no swelling, left TM with mild erythema around the periphery, no purulent fluid behind the TM, right TM normal  Eyes: Conjunctivae are normal. Pupils are equal, round, and reactive to light.  Neck: Neck supple.  Cardiovascular: Normal rate, regular rhythm and normal heart sounds.  Exam reveals no gallop and no friction rub.   No murmur heard. Pulmonary/Chest: Effort normal and breath sounds normal. No respiratory distress. She has no wheezes. She has no rales.  Lymphadenopathy:    She has no cervical adenopathy.  Neurological: She is alert. Gait normal.  Skin: Skin is warm and dry. She is not diaphoretic.     Assessment/Plan: Please see individual problem list.  Viral upper respiratory illness Patient's symptoms still consistent with viral upper respiratory infection. Nasal mucous is still clear. She is afebrile. She is well-appearing. Discussed that this is likely  not bacterial in nature. Given lack of improvement on steroid nasal spray and antihistamine tablet we'll give trial of prednisone taper. No role for antibiotics at this time. Discussed that if her symptoms do not improve by Friday she should let us know as there may be a role for antibiotics at that time. Note doxycycline and azithromycin can increase risk of bleeding on somebody on warfarin. Patient has an allergy to penicillins. She's given return precautions.    Meds ordered this encounter  Medications  . predniSONE (DELTASONE) 10 MG tablet    Sig: Take 50 mg (5 tablets) by mouth today, then decrease by one tablet daily by mouth    Dispense:  15 tablet    Refill:  0    Tommi Rumps

## 2015-04-13 NOTE — Progress Notes (Signed)
Pre visit review using our clinic review tool, if applicable. No additional management support is needed unless otherwise documented below in the visit note. 

## 2015-04-13 NOTE — Patient Instructions (Signed)
Nice to see you. Her symptoms are likely related to a virus or allergies. We'll treat this with prednisone. If you're not improved in the next 2 days please call us. If he worsens or develops new symptoms he'll need to be evaluated again. If you develop fever, shortness of breath, cough productive of blood, or any new or changing symptoms please seek medical attention.

## 2015-04-13 NOTE — Assessment & Plan Note (Signed)
Patient's symptoms still consistent with viral upper respiratory infection. Nasal mucous is still clear. She is afebrile. She is well-appearing. Discussed that this is likely not bacterial in nature. Given lack of improvement on steroid nasal spray and antihistamine tablet we'll give trial of prednisone taper. No role for antibiotics at this time. Discussed that if her symptoms do not improve by Friday she should let us know as there may be a role for antibiotics at that time. Note doxycycline and azithromycin can increase risk of bleeding on somebody on warfarin. Patient has an allergy to penicillins. She's given return precautions.

## 2015-04-16 ENCOUNTER — Ambulatory Visit (INDEPENDENT_AMBULATORY_CARE_PROVIDER_SITE_OTHER): Payer: Medicare Other | Admitting: Family Medicine

## 2015-04-16 ENCOUNTER — Telehealth: Payer: Self-pay | Admitting: Internal Medicine

## 2015-04-16 ENCOUNTER — Encounter: Payer: Self-pay | Admitting: Family Medicine

## 2015-04-16 VITALS — BP 152/82 | HR 95 | Temp 97.6°F | Ht 67.5 in | Wt 238.4 lb

## 2015-04-16 DIAGNOSIS — J40 Bronchitis, not specified as acute or chronic: Secondary | ICD-10-CM | POA: Insufficient documentation

## 2015-04-16 DIAGNOSIS — J209 Acute bronchitis, unspecified: Secondary | ICD-10-CM | POA: Diagnosis not present

## 2015-04-16 MED ORDER — DOXYCYCLINE HYCLATE 100 MG PO TABS: 100.0000 mg | ORAL_TABLET | Freq: Two times a day (BID) | ORAL | Status: DC

## 2015-04-16 MED ORDER — ALBUTEROL SULFATE HFA 108 (90 BASE) MCG/ACT IN AERS: 2.0000 | INHALATION_SPRAY | Freq: Four times a day (QID) | RESPIRATORY_TRACT | Status: DC | PRN

## 2015-04-16 MED ORDER — PREDNISONE 10 MG PO TABS: ORAL_TABLET | ORAL | Status: DC

## 2015-04-16 NOTE — Progress Notes (Signed)
Pre visit review using our clinic review tool, if applicable. No additional management support is needed unless otherwise documented below in the visit note. 

## 2015-04-16 NOTE — Assessment & Plan Note (Signed)
New problem. Patient with recent URI is now developed acute bronchitis. Given wheezing on exam, treating with albuterol, doxycycline, and prednisone (I lengthened recent course).

## 2015-04-16 NOTE — Patient Instructions (Addendum)
Take the antibiotic as prescribed.  Use the inhaler every 6 hours as needed for wheezing.  Take the 2 tablets on the prednisone tomorrow and Sunday. Then 1 tablet on Monday. Then stop.  Follow up for your INR check on Wed.  Take care  Dr. Lacinda Axon

## 2015-04-16 NOTE — Progress Notes (Signed)
Subjective:  [name], female    DOB: 09/28/1940  Age: 75 y.o. MRN: [id number]  CC: Wheezing, congestion  HPI:  75 year old female presents to clinic today for an acute visit with the above complaints.  Patient has been seen twice in the past week (2/3 & 2/7). Office visits were reviewed today. Her clinical picture at both office visits was thought to be viral in origin. She was initially treated with Flonase and an antihistamine. At her second visit she was treated with prednisone.  She presents today with complaints of continued head congestion. Her primary concern however is that she has started to wheeze. She states that it began last night/early this morning. She has had audible wheezing. She spoke with her daughter and her daughter thought that she should be evaluated. No associated fevers or chills. No shortness of breath. No known exacerbating or relieving factors. She has had improvement in her congestion but states that it is not completely resolved.  Social Hx   Social History   Social History  . Marital Status: Married    Spouse Name: N/A  . Number of Children: N/A  . Years of Education: N/A   Social History Main Topics  . Smoking status: Never Smoker   . Smokeless tobacco: Never Used  . Alcohol Use: No  . Drug Use: No  . Sexual Activity: Not Asked   Other Topics Concern  . None   Social History Narrative   Review of Systems  Constitutional: Negative for fever and chills.  HENT: Positive for congestion.   Respiratory: Positive for wheezing.    Objective:  BP 152/82 mmHg  Pulse 95  Temp(Src) 97.6 F (36.4 C) (Oral)  Ht 5' 7.5" (1.715 m)  Wt 238 lb 6 oz (108.126 kg)  BMI 36.76 kg/m2  SpO2 93%  BP/Weight 04/16/2015 [id number]  Systolic BP 0000000 [id number]  Diastolic BP 82 76 66  Wt. (Lbs) 238.38 236.2 239.2  BMI 36.76 36.43 36.89   Physical Exam  Constitutional: She is oriented to person, place, and time. She appears  well-developed. No distress.  HENT:  Head: Normocephalic and atraumatic.  Pulmonary/Chest: Effort normal.  Diffuse expiratory wheezing.  Abdominal: Soft.  Tender to palpation in epigastrium. No rebound or guarding.  Neurological: She is alert and oriented to person, place, and time.  Vitals reviewed.  Lab Results  Component Value Date   WBC 6.1 10/02/2014   HGB 14.3 10/02/2014   HCT 42.7 10/02/2014   PLT 214.0 10/02/2014   GLUCOSE 107* 10/02/2014   CHOL 145 10/02/2014   TRIG 106.0 10/02/2014   HDL 53.50 10/02/2014   LDLCALC 71 10/02/2014   ALT 17 10/02/2014   AST 22 10/02/2014   NA 136 10/02/2014   K 4.9 10/02/2014   CL 102 10/02/2014   CREATININE 0.91 10/02/2014   BUN 16 10/02/2014   CO2 28 10/02/2014   TSH 3.31 06/10/2014   INR 2.6* 03/30/2015   HGBA1C 5.8 10/02/2014   MICROALBUR 0.1 02/02/2014    Assessment & Plan:   Problem List Items Addressed This Visit    Acute bronchitis - Primary    New problem. Patient with recent URI is now developed acute bronchitis. Given wheezing on exam, treating with albuterol, doxycycline, and prednisone (I lengthened recent course).         Meds ordered this encounter  Medications  . albuterol (PROVENTIL HFA;VENTOLIN HFA) 108 (90 Base) MCG/ACT inhaler    Sig: Inhale 2 puffs into  the lungs every 6 (six) hours as needed for wheezing or shortness of breath.    Dispense:  1 Inhaler    Refill:  0  . doxycycline (VIBRA-TABS) 100 MG tablet    Sig: Take 1 tablet (100 mg total) by mouth 2 (two) times daily.    Dispense:  20 tablet    Refill:  0  . predniSONE (DELTASONE) 10 MG tablet    Sig: Take 2 tablets on for the next 2 days then decrease to 1 tablet daily for 1 day then stop.    Dispense:  5 tablet    Refill:  0    Follow-up: PRN  [place]

## 2015-04-16 NOTE — Telephone Encounter (Signed)
Patient Name: Anita Walker DOB: 04-03-1940 Initial Comment Caller states she is on steroid for sinus congestion, has started wheezing Nurse Assessment Nurse: Ronnald Ramp, RN, Miranda Date/Time (Eastern Time): 04/16/2015 8:31:21 AM Confirm and document reason for call. If symptomatic, describe symptoms. You must click the next button to save text entered. ---Caller states she has had cough and cold for 1 week. She was seen by MD on Friday and again on Tuesday and prescribed steroids. She states she is wheezing today. Can hear the audible wheezing over the phone. Has the patient traveled out of the country within the last 30 days? ---No Does the patient have any new or worsening symptoms? ---Yes Will a triage be completed? ---Yes Related visit to physician within the last 2 weeks? ---Yes Does the PT have any chronic conditions? (i.e. diabetes, asthma, etc.) ---Yes List chronic conditions. ---HTN, High Cholesterol, on blood thinners Is this a behavioral health or substance abuse call? ---No Guidelines Guideline Title Affirmed Question Affirmed Notes Cough - Acute Productive Wheezing is present Final Disposition User See Physician within 4 Hours (or PCP triage) Ronnald Ramp, RN, Miranda Comments No appt available with PCP. Appt scheduled with Dr. Lacinda Axon at [place] P[facility] OFFICE Disagree/Comply: Comply

## 2015-04-21 ENCOUNTER — Other Ambulatory Visit (INDEPENDENT_AMBULATORY_CARE_PROVIDER_SITE_OTHER): Payer: Medicare Other

## 2015-04-21 ENCOUNTER — Other Ambulatory Visit: Payer: Self-pay | Admitting: Internal Medicine

## 2015-04-21 DIAGNOSIS — Z5181 Encounter for therapeutic drug level monitoring: Secondary | ICD-10-CM | POA: Diagnosis not present

## 2015-04-21 DIAGNOSIS — Z7901 Long term (current) use of anticoagulants: Secondary | ICD-10-CM

## 2015-04-21 LAB — PROTIME-INR
INR: 2.8 ratio — ABNORMAL HIGH (ref 0.8–1.0)
Prothrombin Time: 29.7 s — ABNORMAL HIGH (ref 9.6–13.1)

## 2015-04-21 NOTE — Progress Notes (Signed)
Order placed for pt/inr 

## 2015-04-21 NOTE — Progress Notes (Signed)
Patient here today for lab draw only. 

## 2015-04-27 ENCOUNTER — Other Ambulatory Visit: Payer: Self-pay | Admitting: Internal Medicine

## 2015-05-05 DIAGNOSIS — Z1283 Encounter for screening for malignant neoplasm of skin: Secondary | ICD-10-CM | POA: Diagnosis not present

## 2015-05-05 DIAGNOSIS — L570 Actinic keratosis: Secondary | ICD-10-CM | POA: Diagnosis not present

## 2015-05-05 DIAGNOSIS — Z08 Encounter for follow-up examination after completed treatment for malignant neoplasm: Secondary | ICD-10-CM | POA: Diagnosis not present

## 2015-05-05 DIAGNOSIS — Z872 Personal history of diseases of the skin and subcutaneous tissue: Secondary | ICD-10-CM | POA: Diagnosis not present

## 2015-05-05 DIAGNOSIS — Z85828 Personal history of other malignant neoplasm of skin: Secondary | ICD-10-CM | POA: Diagnosis not present

## 2015-05-05 DIAGNOSIS — L2089 Other atopic dermatitis: Secondary | ICD-10-CM | POA: Diagnosis not present

## 2015-05-06 ENCOUNTER — Other Ambulatory Visit (INDEPENDENT_AMBULATORY_CARE_PROVIDER_SITE_OTHER): Payer: Medicare Other

## 2015-05-06 DIAGNOSIS — Z7901 Long term (current) use of anticoagulants: Secondary | ICD-10-CM

## 2015-05-06 LAB — PROTIME-INR
INR: 3.6 ratio — ABNORMAL HIGH (ref 0.8–1.0)
PROTHROMBIN TIME: 38.8 s — AB (ref 9.6–13.1)

## 2015-05-07 ENCOUNTER — Telehealth: Payer: Self-pay

## 2015-05-07 ENCOUNTER — Other Ambulatory Visit: Payer: Medicare Other

## 2015-05-07 NOTE — Telephone Encounter (Signed)
Pt.notified

## 2015-05-07 NOTE — Telephone Encounter (Signed)
She held her coumadin last night.  Have her continue her dose she has been on and recheck pt/inr on Monday 05/10/15

## 2015-05-07 NOTE — Telephone Encounter (Signed)
Patient came in for her redraw PT/INR this am, we had 5 unsuccessful sticks to obtain a specimen. (Two people tried) she is going to return on Monday at 130 to have it drawn as her right arm is bruised from the past sticks.  Please advise what she should do with her coumadin dosing over the weekend.  Draw was 3.6 yesterday and she held the coumadin last night per your request.  Thanks

## 2015-05-10 ENCOUNTER — Other Ambulatory Visit (INDEPENDENT_AMBULATORY_CARE_PROVIDER_SITE_OTHER): Payer: Medicare Other

## 2015-05-10 DIAGNOSIS — Z7901 Long term (current) use of anticoagulants: Secondary | ICD-10-CM

## 2015-05-10 LAB — PROTIME-INR
INR: 2.4 ratio — AB (ref 0.8–1.0)
Prothrombin Time: 25.6 s — ABNORMAL HIGH (ref 9.6–13.1)

## 2015-05-11 ENCOUNTER — Ambulatory Visit: Payer: BLUE CROSS/BLUE SHIELD | Admitting: Sports Medicine

## 2015-05-20 ENCOUNTER — Encounter: Payer: Self-pay | Admitting: Internal Medicine

## 2015-05-20 ENCOUNTER — Ambulatory Visit (INDEPENDENT_AMBULATORY_CARE_PROVIDER_SITE_OTHER): Payer: Medicare Other | Admitting: Internal Medicine

## 2015-05-20 ENCOUNTER — Ambulatory Visit
Admission: RE | Admit: 2015-05-20 | Discharge: 2015-05-20 | Disposition: A | Discharge status: Home / Self Care | Payer: Medicare Other | Source: Ambulatory Visit | Attending: Internal Medicine | Admitting: Internal Medicine

## 2015-05-20 VITALS — BP 126/74 | HR 81 | Temp 97.6°F | Resp 20 | Ht 67.5 in | Wt 238.0 lb

## 2015-05-20 DIAGNOSIS — M79644 Pain in right finger(s): Secondary | ICD-10-CM | POA: Diagnosis not present

## 2015-05-20 DIAGNOSIS — C50919 Malignant neoplasm of unspecified site of unspecified female breast: Secondary | ICD-10-CM

## 2015-05-20 DIAGNOSIS — M19041 Primary osteoarthritis, right hand: Secondary | ICD-10-CM | POA: Insufficient documentation

## 2015-05-20 DIAGNOSIS — E119 Type 2 diabetes mellitus without complications: Secondary | ICD-10-CM

## 2015-05-20 DIAGNOSIS — M79641 Pain in right hand: Secondary | ICD-10-CM | POA: Diagnosis not present

## 2015-05-20 DIAGNOSIS — Z7901 Long term (current) use of anticoagulants: Secondary | ICD-10-CM | POA: Diagnosis not present

## 2015-05-20 DIAGNOSIS — E7800 Pure hypercholesterolemia, unspecified: Secondary | ICD-10-CM

## 2015-05-20 DIAGNOSIS — M7989 Other specified soft tissue disorders: Secondary | ICD-10-CM | POA: Diagnosis not present

## 2015-05-20 DIAGNOSIS — I10 Essential (primary) hypertension: Secondary | ICD-10-CM | POA: Diagnosis not present

## 2015-05-20 LAB — BASIC METABOLIC PANEL
BUN: 20 mg/dL (ref 6–23)
CALCIUM: 10.2 mg/dL (ref 8.4–10.5)
CHLORIDE: 101 meq/L (ref 96–112)
CO2: 28 meq/L (ref 19–32)
CREATININE: 1.06 mg/dL (ref 0.40–1.20)
GFR: 53.4 mL/min — ABNORMAL LOW (ref >60.00)
GLUCOSE: 149 mg/dL — AB (ref 70–99)
Potassium: 4.4 mEq/L (ref 3.5–5.1)
Sodium: 136 mEq/L (ref 135–145)

## 2015-05-20 LAB — CBC WITH DIFFERENTIAL/PLATELET
BASOS ABS: 0 10*3/uL (ref 0.0–0.1)
Basophils Relative: 0.5 % (ref 0.0–3.0)
EOS ABS: 0 10*3/uL (ref 0.0–0.7)
EOS PCT: 0.4 % (ref 0.0–5.0)
HCT: 43.2 % (ref 36.0–46.0)
HEMOGLOBIN: 14.5 g/dL (ref 12.0–15.0)
LYMPHS ABS: 1.5 10*3/uL (ref 0.7–4.0)
Lymphocytes Relative: 17.4 % (ref 12.0–46.0)
MCHC: 33.5 g/dL (ref 30.0–36.0)
MCV: 89.2 fl (ref 78.0–100.0)
MONO ABS: 0.7 10*3/uL (ref 0.1–1.0)
Monocytes Relative: 8.6 % (ref 3.0–12.0)
NEUTROS PCT: 73.1 % (ref 43.0–77.0)
Neutro Abs: 6.2 10*3/uL (ref 1.4–7.7)
Platelets: 249 10*3/uL (ref 150.0–400.0)
RBC: 4.85 Mil/uL (ref 3.87–5.11)
RDW: 14.7 % (ref 11.5–15.5)
WBC: 8.5 10*3/uL (ref 4.0–10.5)

## 2015-05-20 LAB — PROTIME-INR
INR: 3 ratio — ABNORMAL HIGH (ref 0.8–1.0)
Prothrombin Time: 32.2 s — ABNORMAL HIGH (ref 9.6–13.1)

## 2015-05-20 NOTE — Progress Notes (Signed)
[name], female   DOB: [birth year], 75 y.o.   MRN: [id number]   Subjective:    [name], female    DOB: Sep 10, 1940, 75 y.o.   MRN: [id number]  HPI  Patient here for a scheduled follow up.  Was previously treated for cough and congestion.  This resolved.  No chest pain.  No tightness.  No sob.  No cough or congestion.  No acid reflux.  No abdominal pain or cramping.  Bowels stable.  Pain right third finger.  Increased swelling.  Swelling improved now.  Still with some swelling and pain.  No known injury.     Past Medical History  Diagnosis Date  . Hypertension   . Diabetes mellitus ([place])   . Breast cancer ([place])     s/p lumpectomy 1992.  s/p chemo and xrt  . Hypercholesterolemia   . Atrial fibrillation ([place])   . Anemia   . GERD (gastroesophageal reflux disease)   . Pulmonary emboli ([place])   . Gastric ulcer    Past Surgical History  Procedure Laterality Date  . Breast lumpectomy  03/06/1990    left breast   Family History  Problem Relation Age of Onset  . Heart disease Father   . Heart disease Brother     s/p CABG  . Colon cancer Neg Hx    Social History   Social History  . Marital Status: Married    Spouse Name: N/A  . Number of Children: N/A  . Years of Education: N/A   Social History Main Topics  . Smoking status: Never Smoker   . Smokeless tobacco: Never Used  . Alcohol Use: No  . Drug Use: No  . Sexual Activity: Not Asked   Other Topics Concern  . None   Social History Narrative    Outpatient Encounter Prescriptions as of 05/20/2015  Medication Sig  . ferrous sulfate 325 (65 FE) MG tablet TAKE ONE (1) TABLET EACH DAY  . lisinopril (PRINIVIL,ZESTRIL) 20 MG tablet TAKE ONE (1) TABLET BY MOUTH EVERY DAY  . pantoprazole (PROTONIX) 40 MG tablet Take 40 mg by mouth daily.  . simvastatin (ZOCOR) 10 MG tablet TAKE ONE (1) TABLET EACH DAY  . warfarin (COUMADIN) 2.5 MG tablet TAKE 2 TABLETS ON TUESDAY, THURSDAY, &  SATURDAY AND TAKE 1 TABLET ON ALL OTHER DAYS  . [DISCONTINUED] albuterol (PROVENTIL HFA;VENTOLIN HFA) 108 (90 Base) MCG/ACT inhaler Inhale 2 puffs into the lungs every 6 (six) hours as needed for wheezing or shortness of breath.  . [DISCONTINUED] doxycycline (VIBRA-TABS) 100 MG tablet Take 1 tablet (100 mg total) by mouth 2 (two) times daily.  . [DISCONTINUED] fluticasone (FLONASE) 50 MCG/ACT nasal spray Place 2 sprays into both nostrils daily.  . [DISCONTINUED] loratadine (CLARITIN) 10 MG tablet Take 1 tablet (10 mg total) by mouth daily.  . [DISCONTINUED] predniSONE (DELTASONE) 10 MG tablet Take 2 tablets on for the next 2 days then decrease to 1 tablet daily for 1 day then stop.   No facility-administered encounter medications on file as of 05/20/2015.    Review of Systems  Constitutional: Negative for appetite change and unexpected weight change.  HENT: Negative for congestion and sinus pressure.   Respiratory: Negative for cough, chest tightness and shortness of breath.   Cardiovascular: Negative for chest pain, palpitations and leg swelling.  Gastrointestinal: Negative for nausea, vomiting, abdominal pain and diarrhea.  Genitourinary: Negative for dysuria and difficulty urinating.  Musculoskeletal: Negative for back pain  and joint swelling.  Skin: Negative for color change and rash.  Neurological: Negative for dizziness, light-headedness and headaches.  Psychiatric/Behavioral: Negative for dysphoric mood and agitation.       Objective:    Physical Exam  Constitutional: She appears well-developed and well-nourished. No distress.  HENT:  Nose: Nose normal.  Mouth/Throat: Oropharynx is clear and moist.  Eyes: Conjunctivae are normal. Right eye exhibits no discharge. Left eye exhibits no discharge.  Neck: Neck supple. No thyromegaly present.  Cardiovascular: Normal rate and regular rhythm.   Pulmonary/Chest: Breath sounds normal. No respiratory distress. She has no wheezes.    Abdominal: Soft. Bowel sounds are normal. There is no tenderness.  Musculoskeletal: She exhibits no edema or tenderness.  Lymphadenopathy:    She has no cervical adenopathy.  Skin: Skin is warm and dry. No erythema.  Psychiatric: She has a normal mood and affect. Her behavior is normal.    BP 126/74 mmHg  Pulse 81  Temp(Src) 97.6 F (36.4 C) (Oral)  Resp 20  Ht 5' 7.5" (1.715 m)  Wt 238 lb (107.956 kg)  BMI 36.70 kg/m2  SpO2 95% Wt Readings from Last 3 Encounters:  05/20/15 238 lb (107.956 kg)  04/16/15 238 lb 6 oz (108.126 kg)  04/13/15 236 lb 3.2 oz (107.14 kg)     Lab Results  Component Value Date   WBC 8.5 05/20/2015   HGB 14.5 05/20/2015   HCT 43.2 05/20/2015   PLT 249.0 05/20/2015   GLUCOSE 149* 05/20/2015   CHOL 145 10/02/2014   TRIG 106.0 10/02/2014   HDL 53.50 10/02/2014   LDLCALC 71 10/02/2014   ALT 17 10/02/2014   AST 22 10/02/2014   NA 136 05/20/2015   K 4.4 05/20/2015   CL 101 05/20/2015   CREATININE 1.06 05/20/2015   BUN 20 05/20/2015   CO2 28 05/20/2015   TSH 3.31 06/10/2014   INR 3.0* 05/20/2015   HGBA1C 5.8 10/02/2014   MICROALBUR 0.1 02/02/2014    Mm Digital Screening Bilateral  11/27/2014  CLINICAL DATA:  Screening. EXAM: DIGITAL SCREENING BILATERAL MAMMOGRAM WITH CAD COMPARISON:  Previous exam(s). ACR Breast Density Category b: There are scattered areas of fibroglandular density. FINDINGS: There are no findings suspicious for malignancy. Images were processed with CAD. IMPRESSION: No mammographic evidence of malignancy. A result letter of this screening mammogram will be mailed directly to the patient. RECOMMENDATION: Screening mammogram in one year. (Code:SM-B-01Y) BI-RADS CATEGORY  1: Negative. Electronically Signed   By: Ammie Ferrier M.D.   On: 11/27/2014 07:47       Assessment & Plan:   Problem List Items Addressed This Visit    Breast cancer ([place])    Mammogram 11/27/14 - [facility].       Chronic anticoagulation    Check  pt/inr today.        Relevant Orders   Protime-INR (Completed)   Diabetes mellitus ([place])    Low carb diet and exercise.  Follow met b and a1c.        Finger pain, right - Primary    Right third finger pain and swelling.  No known injury.  Is some better.  Check xray.        Relevant Orders   DG Finger Middle Right (Completed)   DG Hand 2 View Right (Completed)   Hypercholesteremia    Low cholesterol diet and exercise.  Follow lipid panel and liver function tests.  On simvastatin.        Hypertension  Blood pressure under good control.  Continue same medication regimen.  Follow pressures.  Follow metabolic panel.        Relevant Orders   CBC with Differential/Platelet (Completed)   Basic metabolic panel (Completed)   Pain of finger of right hand       Einar Pheasant, MD

## 2015-05-20 NOTE — Progress Notes (Signed)
Pre visit review using our clinic review tool, if applicable. No additional management support is needed unless otherwise documented below in the visit note. 

## 2015-05-21 ENCOUNTER — Other Ambulatory Visit: Payer: Self-pay | Admitting: Internal Medicine

## 2015-05-21 ENCOUNTER — Telehealth: Payer: Self-pay

## 2015-05-21 ENCOUNTER — Ambulatory Visit: Payer: Medicare Other | Admitting: Sports Medicine

## 2015-05-21 DIAGNOSIS — I10 Essential (primary) hypertension: Secondary | ICD-10-CM

## 2015-05-21 DIAGNOSIS — E119 Type 2 diabetes mellitus without complications: Secondary | ICD-10-CM

## 2015-05-21 DIAGNOSIS — Z7901 Long term (current) use of anticoagulants: Secondary | ICD-10-CM

## 2015-05-21 NOTE — Telephone Encounter (Signed)
Notified pt of xray results, pt verbalized understanding and appreciation.

## 2015-05-21 NOTE — Progress Notes (Signed)
Orders placed for labs

## 2015-05-23 ENCOUNTER — Encounter: Payer: Self-pay | Admitting: Internal Medicine

## 2015-05-23 DIAGNOSIS — M79644 Pain in right finger(s): Secondary | ICD-10-CM | POA: Insufficient documentation

## 2015-05-23 NOTE — Assessment & Plan Note (Signed)
Low cholesterol diet and exercise.  Follow lipid panel and liver function tests.  On simvastatin.   

## 2015-05-23 NOTE — Assessment & Plan Note (Signed)
Low carb diet and exercise.  Follow met b and a1c.   

## 2015-05-23 NOTE — Assessment & Plan Note (Signed)
Blood pressure under good control.  Continue same medication regimen.  Follow pressures.  Follow metabolic panel.   

## 2015-05-23 NOTE — Assessment & Plan Note (Signed)
Mammogram 11/27/14 - Birads I.

## 2015-05-23 NOTE — Assessment & Plan Note (Signed)
Right third finger pain and swelling.  No known injury.  Is some better.  Check xray.

## 2015-05-23 NOTE — Assessment & Plan Note (Signed)
Check pt/inr today.  

## 2015-05-26 ENCOUNTER — Other Ambulatory Visit (INDEPENDENT_AMBULATORY_CARE_PROVIDER_SITE_OTHER): Payer: Medicare Other

## 2015-05-26 DIAGNOSIS — I10 Essential (primary) hypertension: Secondary | ICD-10-CM

## 2015-05-26 DIAGNOSIS — Z7901 Long term (current) use of anticoagulants: Secondary | ICD-10-CM

## 2015-05-26 DIAGNOSIS — E119 Type 2 diabetes mellitus without complications: Secondary | ICD-10-CM

## 2015-05-26 LAB — BASIC METABOLIC PANEL
BUN: 21 mg/dL (ref 6–23)
CALCIUM: 9.9 mg/dL (ref 8.4–10.5)
CHLORIDE: 103 meq/L (ref 96–112)
CO2: 27 meq/L (ref 19–32)
CREATININE: 1.02 mg/dL (ref 0.40–1.20)
GFR: 55.83 mL/min — ABNORMAL LOW (ref >60.00)
GLUCOSE: 108 mg/dL — AB (ref 70–99)
Potassium: 4.2 mEq/L (ref 3.5–5.1)
Sodium: 138 mEq/L (ref 135–145)

## 2015-05-26 LAB — PROTIME-INR
INR: 2.7 ratio — ABNORMAL HIGH (ref 0.8–1.0)
Prothrombin Time: 28.6 s — ABNORMAL HIGH (ref 9.6–13.1)

## 2015-05-26 LAB — HEPATIC FUNCTION PANEL
ALBUMIN: 4.1 g/dL (ref 3.5–5.2)
ALT: 19 U/L (ref 0–35)
AST: 24 U/L (ref 0–37)
Alkaline Phosphatase: 36 U/L — ABNORMAL LOW (ref 39–117)
BILIRUBIN DIRECT: 0.1 mg/dL (ref 0.0–0.3)
Total Bilirubin: 0.5 mg/dL (ref 0.2–1.2)
Total Protein: 6.9 g/dL (ref 6.0–8.3)

## 2015-05-27 ENCOUNTER — Other Ambulatory Visit: Payer: Self-pay | Admitting: Internal Medicine

## 2015-05-27 DIAGNOSIS — Z7901 Long term (current) use of anticoagulants: Secondary | ICD-10-CM

## 2015-05-27 NOTE — Progress Notes (Signed)
Order placed for f/u pt/inr

## 2015-05-28 ENCOUNTER — Ambulatory Visit (INDEPENDENT_AMBULATORY_CARE_PROVIDER_SITE_OTHER): Payer: Medicare Other | Admitting: Sports Medicine

## 2015-05-28 ENCOUNTER — Encounter: Payer: Self-pay | Admitting: Sports Medicine

## 2015-05-28 DIAGNOSIS — M79676 Pain in unspecified toe(s): Secondary | ICD-10-CM | POA: Diagnosis not present

## 2015-05-28 DIAGNOSIS — E119 Type 2 diabetes mellitus without complications: Secondary | ICD-10-CM

## 2015-05-28 DIAGNOSIS — B351 Tinea unguium: Secondary | ICD-10-CM | POA: Diagnosis not present

## 2015-05-28 NOTE — Progress Notes (Signed)
[name], female   DOB: 01-11-1941, 75 y.o.   MRN: [id number]  Subjective: Anita Walker is a 75 y.o. female patient with history of type 2 diabetes who presents to office today complaining of long, painful nails  while ambulating in shoes; unable to trim. Patient states that the glucose reading this morning was 121 mg/dl; checks once weekly. Patient denies any new changes in medication or new problems. Patient denies any new cramping, numbness, burning or tingling in the legs.  Patient Active Problem List   Diagnosis Date Noted  . Pain of finger of right hand 05/23/2015  . Finger pain, right 05/20/2015  . Acute bronchitis 04/16/2015  . Health care maintenance 05/23/2014  . Adjustment disorder with mixed anxiety and depressed mood 07/20/2013  . Chronic anticoagulation 04/11/2013  . Long term current use of anticoagulant 04/11/2013  . Hypertension 01/01/2012  . Diabetes mellitus ([place]) 01/01/2012  . Hypercholesteremia 01/01/2012  . Breast cancer ([place]) 01/01/2012  . Essential (primary) hypertension 01/01/2012  . Malignant neoplasm of female breast ([place]) 01/01/2012  . Pure hypercholesterolemia 01/01/2012  . Controlled type 2 diabetes mellitus without complication ([place]) [id number]   Current Outpatient Prescriptions on File Prior to Visit  Medication Sig Dispense Refill  . ferrous sulfate 325 (65 FE) MG tablet TAKE ONE (1) TABLET EACH DAY 90 tablet 1  . lisinopril (PRINIVIL,ZESTRIL) 20 MG tablet TAKE ONE (1) TABLET BY MOUTH EVERY DAY 90 tablet 1  . pantoprazole (PROTONIX) 40 MG tablet Take 40 mg by mouth daily.    . simvastatin (ZOCOR) 10 MG tablet TAKE ONE (1) TABLET EACH DAY 90 tablet 1  . warfarin (COUMADIN) 2.5 MG tablet TAKE 2 TABLETS ON TUESDAY, THURSDAY, & SATURDAY AND TAKE 1 TABLET ON ALL OTHER DAYS 120 tablet 3   No current facility-administered medications on file prior to visit.   Allergies  Allergen Reactions  . Penicillins Other (See Comments)     Had a reaction a long time ago.  Marland Kitchen Penicillin V Potassium Nausea And Vomiting    Objective: General: Patient is awake, alert, and oriented x 3 and in no acute distress.  Integument: Skin is warm, dry and supple bilateral. Nails are tender, long, thickened and  dystrophic with subungual debris, consistent with onychomycosis, 1-5 bilateral. No signs of infection. No open lesions or preulcerative lesions present bilateral. Remaining integument unremarkable.  Vasculature:  Dorsalis Pedis pulse 1/4 bilateral. Posterior Tibial pulse  1/4 bilateral.  Capillary fill time <3 sec 1-5 bilateral. Scant hair growth to the level of the digits. Temperature gradient within normal limits. Mild varicosities present bilateral. No edema present bilateral.   Neurology: The patient has intact sensation measured with a 5.07/10g Semmes Weinstein Monofilament at all pedal sites bilateral . Vibratory sensation slightly diminished bilateral with tuning fork. No Babinski sign present bilateral.   Musculoskeletal: No gross pedal deformities noted bilateral. Muscular strength 5/5 in all lower extremity muscular groups bilateral without pain or limitation on range of motion . No tenderness with calf compression bilateral.  Assessment and Plan: Problem List Items Addressed This Visit    None    Visit Diagnoses    Dermatophytosis of nail    -  Primary    Pain of toe, unspecified laterality        Diabetes mellitus without complication ([place])          -Examined patient. -Discussed and educated patient on diabetic foot care, especially with  regards to the vascular, neurological and  musculoskeletal systems.  -Stressed the importance of good glycemic control and the detriment of not  controlling glucose levels in relation to the foot. -Mechanically debrided all nails 1-5 bilateral using sterile nail nipper and filed with dremel without incident  -Answered all patient questions -Patient to return in 10 weeks for at  risk foot care  -Patient advised to call the office if any problems or questions arise in the meantime.  Landis Martins, DPM

## 2015-06-07 ENCOUNTER — Other Ambulatory Visit (INDEPENDENT_AMBULATORY_CARE_PROVIDER_SITE_OTHER): Payer: Medicare Other

## 2015-06-07 DIAGNOSIS — Z7901 Long term (current) use of anticoagulants: Secondary | ICD-10-CM

## 2015-06-07 DIAGNOSIS — Z5181 Encounter for therapeutic drug level monitoring: Secondary | ICD-10-CM | POA: Diagnosis not present

## 2015-06-07 LAB — PROTIME-INR
INR: 2.7 ratio — ABNORMAL HIGH (ref 0.8–1.0)
Prothrombin Time: 29 s — ABNORMAL HIGH (ref 9.6–13.1)

## 2015-06-22 ENCOUNTER — Other Ambulatory Visit: Payer: Self-pay | Admitting: Internal Medicine

## 2015-06-23 ENCOUNTER — Other Ambulatory Visit (INDEPENDENT_AMBULATORY_CARE_PROVIDER_SITE_OTHER): Payer: Medicare Other

## 2015-06-23 DIAGNOSIS — Z7901 Long term (current) use of anticoagulants: Secondary | ICD-10-CM

## 2015-06-23 LAB — PROTIME-INR
INR: 2.3 ratio — ABNORMAL HIGH (ref 0.8–1.0)
Prothrombin Time: 24.9 s — ABNORMAL HIGH (ref 9.6–13.1)

## 2015-07-06 ENCOUNTER — Encounter: Payer: Self-pay | Admitting: Family Medicine

## 2015-07-06 ENCOUNTER — Ambulatory Visit (INDEPENDENT_AMBULATORY_CARE_PROVIDER_SITE_OTHER): Payer: Medicare Other | Admitting: Family Medicine

## 2015-07-06 VITALS — BP 130/70 | HR 80 | Temp 98.3°F | Wt 238.2 lb

## 2015-07-06 DIAGNOSIS — K130 Diseases of lips: Secondary | ICD-10-CM

## 2015-07-06 NOTE — Patient Instructions (Signed)
Use over the counter Aquafor for your lips.  Follow up closely with Dr. Nicki Reaper.  Take care  Dr. Lacinda Axon

## 2015-07-06 NOTE — Progress Notes (Signed)
   Subjective:  [name], female    DOB: Jul 20, 1940  Age: 75 y.o. MRN: [id number]  CC: Lip burning/irritation  HPI:  75 year old female presents with the above complaint.  Patient states that she's had burning/irritation of her lips the past 4 days. She states that she always has dry this but is worsened after application of lipstick recently. She states that symptoms are mild to moderate in severity. She reports some swelling inside her mouth as well. No lesions or ulcerations noted. No new medication changes. No known exacerbating factors. She's been using OTC Carmex without resolution.   Social Hx   Social History   Social History  . Marital Status: Married    Spouse Name: N/A  . Number of Children: N/A  . Years of Education: N/A   Social History Main Topics  . Smoking status: Never Smoker   . Smokeless tobacco: Never Used  . Alcohol Use: No  . Drug Use: No  . Sexual Activity: Not Asked   Other Topics Concern  . None   Social History Narrative   Review of Systems  Constitutional: Negative.   HENT:       Lip irritation.   Objective:  BP 130/70 mmHg  Pulse 80  Temp(Src) 98.3 F (36.8 C)  Wt 238 lb 3.2 oz (108.047 kg)  BP/Weight 07/06/2015 05/20/2015 [id number]  Systolic BP [id number]  Diastolic BP 70 74 82  Wt. (Lbs) 238.2 238 238.38  BMI 36.74 36.7 36.76   Physical Exam  Constitutional: She is oriented to person, place, and time. She appears well-developed. No distress.  HENT:  Mouth - mucosa moist. No ulcerations/lesions noted. Poor fitting lower dentures. Lips - mild erythema. No lesions noted. Moist.  Pulmonary/Chest: Effort normal.  Neurological: She is alert and oriented to person, place, and time.  Psychiatric: She has a normal mood and affect.  Vitals reviewed.  Lab Results  Component Value Date   WBC 8.5 05/20/2015   HGB 14.5 05/20/2015   HCT 43.2 05/20/2015   PLT 249.0 05/20/2015   GLUCOSE 108* 05/26/2015   CHOL 145  10/02/2014   TRIG 106.0 10/02/2014   HDL 53.50 10/02/2014   LDLCALC 71 10/02/2014   ALT 19 05/26/2015   AST 24 05/26/2015   NA 138 05/26/2015   K 4.2 05/26/2015   CL 103 05/26/2015   CREATININE 1.02 05/26/2015   BUN 21 05/26/2015   CO2 27 05/26/2015   TSH 3.31 06/10/2014   INR 2.3* 06/23/2015   HGBA1C 5.8 10/02/2014   MICROALBUR 0.1 02/02/2014    Assessment & Plan:   Problem List Items Addressed This Visit    Dry lips - Primary    New problem. Advised OTC Aquaphor & SPF lip balm when outside.          Follow-up: PRN  [place]

## 2015-07-06 NOTE — Assessment & Plan Note (Signed)
New problem. Advised OTC Aquaphor & SPF lip balm when outside.

## 2015-07-08 ENCOUNTER — Ambulatory Visit: Payer: BLUE CROSS/BLUE SHIELD | Admitting: Family Medicine

## 2015-07-15 ENCOUNTER — Other Ambulatory Visit (INDEPENDENT_AMBULATORY_CARE_PROVIDER_SITE_OTHER): Payer: Medicare Other

## 2015-07-15 DIAGNOSIS — Z7901 Long term (current) use of anticoagulants: Secondary | ICD-10-CM | POA: Diagnosis not present

## 2015-07-15 LAB — PROTIME-INR
INR: 2.6 ratio — ABNORMAL HIGH (ref 0.8–1.0)
Prothrombin Time: 28.5 s — ABNORMAL HIGH (ref 9.6–13.1)

## 2015-07-23 DIAGNOSIS — L249 Irritant contact dermatitis, unspecified cause: Secondary | ICD-10-CM | POA: Diagnosis not present

## 2015-07-30 ENCOUNTER — Other Ambulatory Visit: Payer: Self-pay | Admitting: Internal Medicine

## 2015-07-31 ENCOUNTER — Other Ambulatory Visit: Payer: Self-pay | Admitting: Internal Medicine

## 2015-08-06 ENCOUNTER — Encounter: Payer: Self-pay | Admitting: Sports Medicine

## 2015-08-06 ENCOUNTER — Ambulatory Visit (INDEPENDENT_AMBULATORY_CARE_PROVIDER_SITE_OTHER): Payer: Medicare Other | Admitting: Sports Medicine

## 2015-08-06 DIAGNOSIS — B351 Tinea unguium: Secondary | ICD-10-CM

## 2015-08-06 DIAGNOSIS — I739 Peripheral vascular disease, unspecified: Secondary | ICD-10-CM

## 2015-08-06 DIAGNOSIS — M79676 Pain in unspecified toe(s): Secondary | ICD-10-CM | POA: Diagnosis not present

## 2015-08-06 DIAGNOSIS — E119 Type 2 diabetes mellitus without complications: Secondary | ICD-10-CM

## 2015-08-06 NOTE — Progress Notes (Signed)
[name], female   DOB: [birth year], 75 y.o.   MRN: [id number] Subjective: Anita Walker is a 75 y.o. female patient with history of type 2 diabetes who presents to office today complaining of long, painful nails  while ambulating in shoes; unable to trim. Patient states that the glucose reading this morning was 112 mg/dl; checks once weekly. Patient denies any new changes in medication or new problems. Patient denies any new cramping, numbness, burning or tingling in the legs.  Patient Active Problem List   Diagnosis Date Noted  . Dry lips 07/06/2015  . Pain of finger of right hand 05/23/2015  . Finger pain, right 05/20/2015  . Acute bronchitis 04/16/2015  . Health care maintenance 05/23/2014  . Adjustment disorder with mixed anxiety and depressed mood 07/20/2013  . Chronic anticoagulation 04/11/2013  . Long term current use of anticoagulant 04/11/2013  . Hypertension 01/01/2012  . Diabetes mellitus ([place]) 01/01/2012  . Hypercholesteremia 01/01/2012  . Breast cancer ([place]) 01/01/2012  . Essential (primary) hypertension 01/01/2012  . Malignant neoplasm of female breast ([place]) 01/01/2012  . Pure hypercholesterolemia 01/01/2012  . Controlled type 2 diabetes mellitus without complication ([place]) [id number]   Current Outpatient Prescriptions on File Prior to Visit  Medication Sig Dispense Refill  . ferrous sulfate 325 (65 FE) MG tablet TAKE ONE (1) TABLET BY MOUTH EVERY DAY 90 tablet 3  . lisinopril (PRINIVIL,ZESTRIL) 20 MG tablet TAKE ONE (1) TABLET BY MOUTH EVERY DAY 90 tablet 1  . pantoprazole (PROTONIX) 40 MG tablet TAKE ONE (1) TABLET BY MOUTH EVERY DAY 90 tablet 0  . simvastatin (ZOCOR) 10 MG tablet TAKE ONE (1) TABLET EACH DAY 90 tablet 1  . warfarin (COUMADIN) 2.5 MG tablet TAKE 2 TABLETS BY MOUTH ON TUESDAY, THURSDAY, AND SATURDAY, AND 1 TABLET ON ALL OTHER DAYS 120 tablet 1   No current facility-administered medications on file prior to visit.    Allergies  Allergen Reactions  . Penicillins Other (See Comments)    Had a reaction a long time ago.  Marland Kitchen Penicillin V Potassium Nausea And Vomiting    Objective: General: Patient is awake, alert, and oriented x 3 and in no acute distress.  Integument: Skin is warm, dry and supple bilateral. Nails are tender, long, thickened and  dystrophic with subungual debris, consistent with onychomycosis, 1-5 bilateral. No signs of infection. No open lesions or preulcerative lesions present bilateral. Remaining integument unremarkable.  Vasculature:  Dorsalis Pedis pulse 1/4 bilateral. Posterior Tibial pulse  1/4 bilateral.  Capillary fill time <3 sec 1-5 bilateral. Scant hair growth to the level of the digits. Temperature gradient within normal limits. Mild varicosities present bilateral. Trace edema present with venous hyperpigmentation bilateral.   Neurology: The patient has intact sensation measured with a 5.07/10g Semmes Weinstein Monofilament at all pedal sites bilateral . Vibratory sensation slightly diminished bilateral with tuning fork. No Babinski sign present bilateral.   Musculoskeletal: No gross pedal deformities noted bilateral. Muscular strength 5/5 in all lower extremity muscular groups bilateral without pain or limitation on range of motion . No tenderness with calf compression bilateral.  Assessment and Plan: Problem List Items Addressed This Visit    None    Visit Diagnoses    Dermatophytosis of nail    -  Primary    Pain of toe, unspecified laterality        PVD (peripheral vascular disease) ([place])        Diabetes mellitus without complication ([place])          -  Examined patient. -Discussed and educated patient on diabetic foot care, especially with  regards to the vascular, neurological and musculoskeletal systems.  -Stressed the importance of good glycemic control and the detriment of not  controlling glucose levels in relation to the foot. -Mechanically debrided all  nails 1-5 bilateral using sterile nail nipper and filed with dremel without incident  -Encouraged lower limb elevation to assist with trace edema control bilateral -Answered all patient questions -Patient to return in 10 weeks for at risk foot care  -Patient advised to call the office if any problems or questions arise in the meantime.  Landis Martins, DPM

## 2015-08-09 ENCOUNTER — Other Ambulatory Visit: Payer: BLUE CROSS/BLUE SHIELD

## 2015-08-09 ENCOUNTER — Other Ambulatory Visit (INDEPENDENT_AMBULATORY_CARE_PROVIDER_SITE_OTHER): Payer: Medicare Other

## 2015-08-09 DIAGNOSIS — Z7901 Long term (current) use of anticoagulants: Secondary | ICD-10-CM | POA: Diagnosis not present

## 2015-08-09 LAB — PROTIME-INR
INR: 2.6 ratio — ABNORMAL HIGH (ref 0.8–1.0)
Prothrombin Time: 28.1 s — ABNORMAL HIGH (ref 9.6–13.1)

## 2015-08-10 ENCOUNTER — Other Ambulatory Visit: Payer: Self-pay | Admitting: Internal Medicine

## 2015-08-10 DIAGNOSIS — Z7901 Long term (current) use of anticoagulants: Secondary | ICD-10-CM

## 2015-09-03 ENCOUNTER — Encounter: Payer: Self-pay | Admitting: Family Medicine

## 2015-09-03 ENCOUNTER — Ambulatory Visit (INDEPENDENT_AMBULATORY_CARE_PROVIDER_SITE_OTHER): Payer: Medicare Other | Admitting: Family Medicine

## 2015-09-03 VITALS — BP 116/68 | HR 78 | Temp 97.8°F | Wt 240.2 lb

## 2015-09-03 DIAGNOSIS — R05 Cough: Secondary | ICD-10-CM

## 2015-09-03 DIAGNOSIS — R059 Cough, unspecified: Secondary | ICD-10-CM

## 2015-09-03 MED ORDER — BENZONATATE 200 MG PO CAPS: 200.0000 mg | ORAL_CAPSULE | Freq: Two times a day (BID) | ORAL | Status: DC | PRN

## 2015-09-03 NOTE — Assessment & Plan Note (Signed)
Patient with onset of cough yesterday. Some postnasal drip on exam. Suspect viral illness versus allergies. Benign lung exam. No signs of focal bacterial illness. Vital signs are stable. We'll treat cough with Tessalon. Can use Claritin as well. If not improving in the next week or so could consider lisinopril as the cause if she does not develop any further symptoms. Advised if not improving or if she progresses in the next 4-5 days she should follow-up. Given return precautions.

## 2015-09-03 NOTE — Progress Notes (Signed)
[name], female   DOB: 17-Jan-1941, 75 y.o.   MRN: [id number]  Tommi Rumps, MD Phone: [phone]  Anita Walker is a 75 y.o. female who presents today for same-day visit.  Patient reports onset of cough yesterday. Not productive. No sinus congestion or nasal congestion. May be some postnasal drip. No ear fullness. No fevers. No shortness of breath. No wheezing. Does have a history of reflux though this is well controlled per the patient and she is taking Protonix. She is on lisinopril though has never had cough with this medicine previously.   ROS see history of present illness  Objective  Physical Exam Filed Vitals:   09/03/15 1039  BP: 116/68  Pulse: 78  Temp: 97.8 F (36.6 C)    BP Readings from Last 3 Encounters:  09/03/15 116/68  07/06/15 130/70  05/20/15 126/74   Wt Readings from Last 3 Encounters:  09/03/15 240 lb 3.2 oz (108.954 kg)  07/06/15 238 lb 3.2 oz (108.047 kg)  05/20/15 238 lb (107.956 kg)    Physical Exam  Constitutional: She is well-developed, well-nourished, and in no distress.  HENT:  Head: Normocephalic and atraumatic.  Right Ear: External ear normal.  Left Ear: External ear normal.  Mouth/Throat: No oropharyngeal exudate.  Mild postnasal drip, normal TMs bilaterally  Eyes: Conjunctivae are normal. Pupils are equal, round, and reactive to light.  Cardiovascular: Normal rate, regular rhythm and normal heart sounds.   Pulmonary/Chest: Effort normal and breath sounds normal.  Neurological: She is alert. Gait normal.  Skin: Skin is warm and dry. She is not diaphoretic.     Assessment/Plan: Please see individual problem list.  Cough Patient with onset of cough yesterday. Some postnasal drip on exam. Suspect viral illness versus allergies. Benign lung exam. No signs of focal bacterial illness. Vital signs are stable. We'll treat cough with Tessalon. Can use Claritin as well. If not improving in the next week or  so could consider lisinopril as the cause if she does not develop any further symptoms. Advised if not improving or if she progresses in the next 4-5 days she should follow-up. Given return precautions.    No orders of the defined types were placed in this encounter.    Meds ordered this encounter  Medications  . benzonatate (TESSALON) 200 MG capsule    Sig: Take 1 capsule (200 mg total) by mouth 2 (two) times daily as needed for cough.    Dispense:  20 capsule    Refill:  0    Tommi Rumps, MD [place]

## 2015-09-03 NOTE — Patient Instructions (Signed)
Nice to see you. Your cough could be related to a viral illness. You should stay well hydrated and can take Claritin for this. You may also use the Tessalon for cough. If you develop chest pain, shortness of breath, cough productive of blood, fevers, or any new or changing symptoms please seek medical attention.

## 2015-09-03 NOTE — Progress Notes (Signed)
Pre visit review using our clinic review tool, if applicable. No additional management support is needed unless otherwise documented below in the visit note. 

## 2015-09-09 ENCOUNTER — Other Ambulatory Visit (INDEPENDENT_AMBULATORY_CARE_PROVIDER_SITE_OTHER): Payer: Medicare Other

## 2015-09-09 DIAGNOSIS — Z7901 Long term (current) use of anticoagulants: Secondary | ICD-10-CM | POA: Diagnosis not present

## 2015-09-09 LAB — PROTIME-INR
INR: 2.4 ratio — AB (ref 0.8–1.0)
Prothrombin Time: 26.1 s — ABNORMAL HIGH (ref 9.6–13.1)

## 2015-09-16 ENCOUNTER — Telehealth: Payer: Self-pay | Admitting: Internal Medicine

## 2015-09-16 MED ORDER — LISINOPRIL 20 MG PO TABS: ORAL_TABLET | ORAL | Status: DC

## 2015-09-16 NOTE — Telephone Encounter (Signed)
Pt needs a refill on lisinopril (PRINIVIL,ZESTRIL) 20 MG tablet sent to [facility]

## 2015-09-16 NOTE — Telephone Encounter (Signed)
rx sent thanks 

## 2015-09-17 ENCOUNTER — Encounter: Payer: BLUE CROSS/BLUE SHIELD | Admitting: Internal Medicine

## 2015-09-26 ENCOUNTER — Emergency Department
Admission: EM | Admit: 2015-09-26 | Discharge: 2015-09-26 | Discharge status: Absent without leave | Payer: BLUE CROSS/BLUE SHIELD

## 2015-09-26 ENCOUNTER — Encounter: Payer: Self-pay | Admitting: Emergency Medicine

## 2015-09-26 ENCOUNTER — Ambulatory Visit
Admission: EM | Admit: 2015-09-26 | Discharge: 2015-09-26 | Disposition: A | Discharge status: Home / Self Care | Payer: Medicare Other | Attending: Family Medicine | Admitting: Family Medicine

## 2015-09-26 ENCOUNTER — Ambulatory Visit
Admission: RE | Admit: 2015-09-26 | Discharge: 2015-09-26 | Disposition: A | Discharge status: Home / Self Care | Payer: Medicare Other | Source: Ambulatory Visit | Attending: Family Medicine | Admitting: Family Medicine

## 2015-09-26 DIAGNOSIS — M79662 Pain in left lower leg: Secondary | ICD-10-CM | POA: Insufficient documentation

## 2015-09-26 DIAGNOSIS — Z79899 Other long term (current) drug therapy: Secondary | ICD-10-CM | POA: Insufficient documentation

## 2015-09-26 DIAGNOSIS — E7800 Pure hypercholesterolemia, unspecified: Secondary | ICD-10-CM | POA: Insufficient documentation

## 2015-09-26 DIAGNOSIS — I10 Essential (primary) hypertension: Secondary | ICD-10-CM | POA: Insufficient documentation

## 2015-09-26 DIAGNOSIS — I4891 Unspecified atrial fibrillation: Secondary | ICD-10-CM | POA: Insufficient documentation

## 2015-09-26 DIAGNOSIS — Z7901 Long term (current) use of anticoagulants: Secondary | ICD-10-CM | POA: Insufficient documentation

## 2015-09-26 DIAGNOSIS — Z853 Personal history of malignant neoplasm of breast: Secondary | ICD-10-CM | POA: Diagnosis not present

## 2015-09-26 DIAGNOSIS — K219 Gastro-esophageal reflux disease without esophagitis: Secondary | ICD-10-CM | POA: Diagnosis not present

## 2015-09-26 DIAGNOSIS — E119 Type 2 diabetes mellitus without complications: Secondary | ICD-10-CM | POA: Diagnosis not present

## 2015-09-26 LAB — PROTIME-INR
INR: 1.98
Prothrombin Time: 22.4 seconds — ABNORMAL HIGH (ref 11.4–15.0)

## 2015-09-26 NOTE — ED Triage Notes (Addendum)
Started with left calf pain 2 days ago but got worse today. Denies any chest pain or shortness of breath. Only sx pt reports is left calf pain. PT on coumadin r/t previous PE

## 2015-09-26 NOTE — ED Provider Notes (Signed)
[facility]    CSN: [id number] Arrival date & time: 09/26/15  1411  First Provider Contact:  None       History   Chief Complaint Chief Complaint  Patient presents with  . Leg Pain    HPI Anita Walker is a 75 y.o. female.   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 Denies any injuries, falls, rash, fevers, chills, chest pain, shortness of breath. Pain is worse when walking. States had INR checked about 2 weeks ago and was therapeutic. Has not added any new medications or foods.    The history is provided by the patient.    Past Medical History:  Diagnosis Date  . Anemia   . Atrial fibrillation ([place])   . Breast cancer ([place])    s/p lumpectomy 1992.  s/p chemo and xrt  . Diabetes mellitus ([place])   . Gastric ulcer   . GERD (gastroesophageal reflux disease)   . Hypercholesterolemia   . Hypertension   . Pulmonary emboli [facility])     Patient Active Problem List   Diagnosis Date Noted  . Cough 09/03/2015  . Dry lips 07/06/2015  . Pain of finger of right hand 05/23/2015  . Finger pain, right 05/20/2015  . Health care maintenance 05/23/2014  . Adjustment disorder with mixed anxiety and depressed mood 07/20/2013  . Chronic anticoagulation 04/11/2013  . Long term current use of anticoagulant 04/11/2013  . Hypertension 01/01/2012  . Diabetes mellitus ([place]) 01/01/2012  . Hypercholesteremia 01/01/2012  . Breast cancer ([place]) 01/01/2012  . Essential (primary) hypertension 01/01/2012  . Malignant neoplasm of female breast ([place]) 01/01/2012  . Pure hypercholesterolemia 01/01/2012  . Controlled type 2 diabetes mellitus without complication ([place]) XX123456    Past Surgical History:  Procedure Laterality Date  . BREAST LUMPECTOMY  03/06/1990   left breast    OB History    No data available       Home Medications    Prior to  Admission medications   Medication Sig Start Date End Date Taking? Authorizing Provider  ferrous sulfate 325 (65 FE) MG tablet TAKE ONE (1) TABLET BY MOUTH EVERY DAY 06/22/15   Einar Pheasant, MD  lisinopril (PRINIVIL,ZESTRIL) 20 MG tablet TAKE ONE (1) TABLET BY MOUTH EVERY DAY 09/16/15   Einar Pheasant, MD  pantoprazole (PROTONIX) 40 MG tablet TAKE ONE (1) TABLET BY MOUTH EVERY DAY 08/03/15   Einar Pheasant, MD  simvastatin (ZOCOR) 10 MG tablet TAKE ONE (1) TABLET EACH DAY 04/27/15   Einar Pheasant, MD  warfarin (COUMADIN) 2.5 MG tablet TAKE 2 TABLETS BY MOUTH ON TUESDAY, THURSDAY, AND SATURDAY, AND 1 TABLET ON ALL OTHER DAYS 07/30/15   Einar Pheasant, MD    Family History Family History  Problem Relation Age of Onset  . Heart disease Father   . Heart disease Brother     s/p CABG  . Colon cancer Neg Hx     Social History Social History  Substance Use Topics  . Smoking status: Never Smoker  . Smokeless tobacco: Never Used  . Alcohol use No     Allergies   Penicillins and Penicillin v potassium   Review of Systems Review of Systems   Physical Exam Triage Vital Signs ED Triage Vitals  Enc Vitals Group     BP 09/26/15 1424 129/65     Pulse Rate 09/26/15 1424 89  Resp 09/26/15 1424 16     Temp 09/26/15 1424 97 F (36.1 C)     Temp Source 09/26/15 1424 Tympanic     SpO2 09/26/15 1424 96 %     Weight 09/26/15 1424 235 lb (106.6 kg)     Height 09/26/15 1424 5\' 9"  (1.753 m)     Head Circumference --      Peak Flow --      Pain Score 09/26/15 1429 7     Pain Loc --      Pain Edu? --      Excl. in [place]? --    No data found.   Updated Vital Signs BP 129/65 (BP Location: Right Arm)   Pulse 89   Temp 97 F (36.1 C) (Tympanic)   Resp 16   Ht 5\' 9"  (1.753 m)   Wt 235 lb (106.6 kg)   SpO2 96%   BMI 34.70 kg/m   Visual Acuity Right Eye Distance:   Left Eye Distance:   Bilateral Distance:    Right Eye Near:   Left Eye Near:    Bilateral Near:     Physical Exam   Constitutional: She appears well-developed and well-nourished. No distress.  Cardiovascular: Normal rate, regular rhythm, normal heart sounds and intact distal pulses.   Pulmonary/Chest: Effort normal and breath sounds normal. No respiratory distress.  Musculoskeletal: She exhibits edema and tenderness.  Left calf tenderness to palpation; no skin rashes; no bony tenderness to lower extremity; neurovascularly intact  Skin: No rash noted. She is not diaphoretic. No erythema.     UC Treatments / Results  Labs (all labs ordered are listed, but only abnormal results are displayed) Labs Reviewed  PROTIME-INR    EKG  EKG Interpretation None       Radiology No results found.  Procedures Procedures (including critical care time)  Medications Ordered in UC Medications - No data to display   Initial Impression / Assessment and Plan / UC Course  I have reviewed the triage vital signs and the nursing notes.  Pertinent labs & imaging results that were available during my care of the patient were reviewed by me and considered in my medical decision making (see chart for details).  Clinical Course      Final Clinical Impressions(s) / UC Diagnoses   Final diagnoses:  Calf pain, left    New Prescriptions New Prescriptions   No medications on file    1. Symptoms/signs and possible diagnosis reviewed with patient 2. Will check INR, however due to patient's history recommend further evaluation at the hospital with a lower extremity venous doppler to rule out DVT; patient will be notified of [place] results and further management   Norval Gable, MD 09/26/15 1507

## 2015-09-26 NOTE — ED Triage Notes (Signed)
Patient c/o left lower leg pain that started yesterday.  Patient denies injury or fall.

## 2015-09-27 ENCOUNTER — Ambulatory Visit (INDEPENDENT_AMBULATORY_CARE_PROVIDER_SITE_OTHER): Payer: Medicare Other | Admitting: Internal Medicine

## 2015-09-27 ENCOUNTER — Ambulatory Visit (INDEPENDENT_AMBULATORY_CARE_PROVIDER_SITE_OTHER): Payer: Medicare Other

## 2015-09-27 ENCOUNTER — Encounter: Payer: Self-pay | Admitting: Internal Medicine

## 2015-09-27 ENCOUNTER — Telehealth: Payer: Self-pay | Admitting: Internal Medicine

## 2015-09-27 VITALS — BP 130/80 | HR 83 | Wt 237.0 lb

## 2015-09-27 DIAGNOSIS — M79605 Pain in left leg: Secondary | ICD-10-CM

## 2015-09-27 DIAGNOSIS — M179 Osteoarthritis of knee, unspecified: Secondary | ICD-10-CM | POA: Diagnosis not present

## 2015-09-27 DIAGNOSIS — Z5181 Encounter for therapeutic drug level monitoring: Secondary | ICD-10-CM | POA: Diagnosis not present

## 2015-09-27 DIAGNOSIS — Z7901 Long term (current) use of anticoagulants: Secondary | ICD-10-CM

## 2015-09-27 DIAGNOSIS — I10 Essential (primary) hypertension: Secondary | ICD-10-CM

## 2015-09-27 DIAGNOSIS — R059 Cough, unspecified: Secondary | ICD-10-CM

## 2015-09-27 DIAGNOSIS — R05 Cough: Secondary | ICD-10-CM

## 2015-09-27 MED ORDER — LOSARTAN POTASSIUM 50 MG PO TABS: 50.0000 mg | ORAL_TABLET | Freq: Every day | ORAL | 1 refills | Status: DC

## 2015-09-27 NOTE — Progress Notes (Signed)
[name], female   DOB: [birth year], 75 y.o.   MRN: [id number]   Subjective:    [name], female    DOB: 04-08-1940, 75 y.o.   MRN: [id number]  HPI  Patient here as a work in with concerns regarding leg pain.  Was seen at [facility] yesterday with worsening calf pain.  See note.  Denies any injuries or falls.  No rash.  No sob.  No fever or chills.  States symptoms started a few days ago.  No change in activity.  Reports pain mostly localized to left posterior calf and popliteal region.  Hurts worse when first tries to get up and walk.  Better once walking.  Hurts to pick up her leg.  She also reports some persistent dry cough.  Was seen recently.  Was questioning if her blood pressure medication could contribute.  No sob.  No chest congestion.    Past Medical History:  Diagnosis Date  . Anemia   . Atrial fibrillation ([place])   . Breast cancer ([place])    s/p lumpectomy 1992.  s/p chemo and xrt  . Diabetes mellitus ([place])   . Gastric ulcer   . GERD (gastroesophageal reflux disease)   . Hypercholesterolemia   . Hypertension   . Pulmonary emboli [facility])    Past Surgical History:  Procedure Laterality Date  . BREAST LUMPECTOMY  03/06/1990   left breast   Family History  Problem Relation Age of Onset  . Heart disease Father   . Heart disease Brother     s/p CABG  . Colon cancer Neg Hx    Social History   Social History  . Marital status: Married    Spouse name: N/A  . Number of children: N/A  . Years of education: N/A   Social History Main Topics  . Smoking status: Never Smoker  . Smokeless tobacco: Never Used  . Alcohol use No  . Drug use: No  . Sexual activity: Not Asked   Other Topics Concern  . None   Social History Narrative  . None    Outpatient Encounter Prescriptions as of 09/27/2015  Medication Sig  . ferrous sulfate 325 (65 FE) MG tablet TAKE ONE (1) TABLET BY MOUTH EVERY DAY  . pantoprazole (PROTONIX) 40 MG  tablet TAKE ONE (1) TABLET BY MOUTH EVERY DAY  . simvastatin (ZOCOR) 10 MG tablet TAKE ONE (1) TABLET EACH DAY  . warfarin (COUMADIN) 2.5 MG tablet TAKE 2 TABLETS BY MOUTH ON TUESDAY, THURSDAY, AND SATURDAY, AND 1 TABLET ON ALL OTHER DAYS  . [DISCONTINUED] lisinopril (PRINIVIL,ZESTRIL) 20 MG tablet TAKE ONE (1) TABLET BY MOUTH EVERY DAY  . losartan (COZAAR) 50 MG tablet Take 1 tablet (50 mg total) by mouth daily.   No facility-administered encounter medications on file as of 09/27/2015.     Review of Systems  Constitutional: Negative for activity change and fever.  HENT: Negative for congestion and sinus pressure.   Respiratory: Positive for cough. Negative for chest tightness and shortness of breath.   Cardiovascular: Negative for chest pain and palpitations.       Some increased swelling of left lower leg.    Gastrointestinal: Negative for abdominal pain, diarrhea, nausea and vomiting.  Genitourinary: Negative for difficulty urinating and dysuria.  Musculoskeletal: Negative for back pain.       Left knee and calf pain as outlined.  No hip or back pain.   Skin: Negative for color change  and rash.  Neurological: Negative for dizziness, light-headedness and headaches.  Psychiatric/Behavioral: Negative for agitation and dysphoric mood.       Objective:    Physical Exam  Constitutional: She appears well-developed and well-nourished. No distress.  Neck: Neck supple. No thyromegaly present.  Cardiovascular: Normal rate and regular rhythm.   Pulmonary/Chest: Effort normal and breath sounds normal. No respiratory distress.  Abdominal: Soft. Bowel sounds are normal. There is no tenderness.  Musculoskeletal:  Some increased pain with going from sitting to standing.  Improved with walking.  Some increased difficulty with bending her knee while standing.  Lower left leg slightly bigger than right.  No increased erythema.  No increased warmth.  Increased pain to palpation over the lower leg and  popliteal region.  No pain to palpation over the knee.  Increased pain to palpation - left calf.    Lymphadenopathy:    She has no cervical adenopathy.  Skin: No rash noted. No erythema.  Psychiatric: She has a normal mood and affect. Her behavior is normal.    BP 130/80   Pulse 83   Wt 237 lb (107.5 kg)   SpO2 96%   BMI 37.12 kg/m  Wt Readings from Last 3 Encounters:  09/27/15 237 lb (107.5 kg)  09/26/15 235 lb (106.6 kg)  09/26/15 235 lb (106.6 kg)     Lab Results  Component Value Date   WBC 8.5 05/20/2015   HGB 14.5 05/20/2015   HCT 43.2 05/20/2015   PLT 249.0 05/20/2015   GLUCOSE 108 (H) 05/26/2015   CHOL 145 10/02/2014   TRIG 106.0 10/02/2014   HDL 53.50 10/02/2014   LDLCALC 71 10/02/2014   ALT 19 05/26/2015   AST 24 05/26/2015   NA 138 05/26/2015   K 4.2 05/26/2015   CL 103 05/26/2015   CREATININE 1.02 05/26/2015   BUN 21 05/26/2015   CO2 27 05/26/2015   TSH 3.31 06/10/2014   INR 1.98 09/26/2015   HGBA1C 5.8 10/02/2014   MICROALBUR 0.1 02/02/2014    US Venous Img Lower Unilateral Left  Result Date: 09/26/2015 CLINICAL DATA:  Left calf pain for 2 days. History of deep venous thrombosis. EXAM: LEFT LOWER EXTREMITY VENOUS DOPPLER ULTRASOUND TECHNIQUE: Gray-scale sonography with graded compression, as well as color Doppler and duplex ultrasound were performed to evaluate the lower extremity deep venous systems from the level of the common femoral vein and including the common femoral, femoral, profunda femoral, popliteal and calf veins including the posterior tibial, peroneal and gastrocnemius veins when visible. The superficial great saphenous vein was also interrogated. Spectral Doppler was utilized to evaluate flow at rest and with distal augmentation maneuvers in the common femoral, femoral and popliteal veins. COMPARISON:  None. FINDINGS: Contralateral Common Femoral Vein: Respiratory phasicity is normal and symmetric with the symptomatic side. No evidence of  thrombus. Normal compressibility. Common Femoral Vein: No evidence of thrombus. Normal compressibility, respiratory phasicity and response to augmentation. Saphenofemoral Junction: No evidence of thrombus. Normal compressibility and flow on color Doppler imaging. Profunda Femoral Vein: No evidence of thrombus. Normal compressibility and flow on color Doppler imaging. Femoral Vein: No evidence of thrombus. Normal compressibility, respiratory phasicity and response to augmentation. Popliteal Vein: No evidence of thrombus. Normal compressibility, respiratory phasicity and response to augmentation. Calf Veins: Left peroneal veins are partially visualized. No thrombus is identified. Superficial Great Saphenous Vein: No evidence of thrombus. Normal compressibility and flow on color Doppler imaging. Venous Reflux:  None. Other Findings:  None. IMPRESSION: No evidence of  deep venous thrombosis. Electronically Signed   By: Inge Rise M.D.   On: 09/26/2015 17:28      Assessment & Plan:   Problem List Items Addressed This Visit    Cough    Persistent dry cough.  No congestion.  Was concerned her blood pressure medication may be contributing.  Will stop lisinopril.  Start losartan 50mg  q day.  Follow pressure.  See if cough resolves.  Notify me if persistent.        Hypertension    Blood pressure under good control.  Continue same medication regimen.  Follow pressures.  Follow metabolic panel.        Relevant Medications   losartan (COZAAR) 50 MG tablet   Left leg pain    Leg, knee and calf pain as outlined.  Having some increased pain with going from sitting to standing and improves with ambulation.  This appears to be more c/w msk origin, I.e., arthritis, etc.  Will check xray of knee.  She is also having some increased pain with palpation over her calf and the more prominent veins and popliteal region.  Lower extremity ultrasound yesterday negative for DVT.  Will have vascular surgery evaluate given  increased pain as outlined.  Pt comfortable with this plan.  Has seen Dr Lucky Cowboy previously.  Scheduled tylenol for pain.  Avoid antiinflammatories.  On coumadin.       Relevant Orders   DG Knee 1-2 Views Left (Completed)   Ambulatory referral to Vascular Surgery    Other Visit Diagnoses    Anticoagulated on Coumadin    -  Primary   Relevant Orders   Protime-INR       Einar Pheasant, MD

## 2015-09-27 NOTE — Telephone Encounter (Signed)
Pt called about having pain in her left leg, Pt did go to the [facility] on yesterday and then she went to the ER. Pt only wants to see Dr Nicki Reaper. Pt did have [place] on leg no blood clots per pt pt was put on a blood thinner. Let me know where to sch pt? No appt avail. Thank you!  Call pt @ [phone].

## 2015-09-27 NOTE — Telephone Encounter (Signed)
See if she can come in at 1:15 today.  Thanks

## 2015-09-27 NOTE — Telephone Encounter (Signed)
Spoke with patient, she is still having leg pain, once she is up moving around she is good.  Went to [facility] urgent care yesterday and had a [place] completed, no DVT/Clot noted.  She was told to increase her Warfarin last night to a double dose, (She took 2 pills instead of one) and to notify her PCP and get scheduled.  Please advise on appt or instructions.

## 2015-09-27 NOTE — Patient Instructions (Signed)
Tylenol (acetominophen) extra strength (500mg )  - take two tablets three times per day as needed.

## 2015-09-28 ENCOUNTER — Encounter: Payer: Self-pay | Admitting: Internal Medicine

## 2015-09-28 NOTE — Assessment & Plan Note (Addendum)
Leg, knee and calf pain as outlined.  Having some increased pain with going from sitting to standing and improves with ambulation.  This appears to be more c/w msk origin, I.e., arthritis, etc.  Will check xray of knee.  She is also having some increased pain with palpation over her calf and the more prominent veins and popliteal region.  Lower extremity ultrasound yesterday negative for DVT.  Will have vascular surgery evaluate given increased pain as outlined.  Pt comfortable with this plan.  Has seen Dr Lucky Cowboy previously.  Scheduled tylenol for pain.  Avoid antiinflammatories.  On coumadin.

## 2015-09-28 NOTE — Assessment & Plan Note (Signed)
Persistent dry cough.  No congestion.  Was concerned her blood pressure medication may be contributing.  Will stop lisinopril.  Start losartan 50mg  q day.  Follow pressure.  See if cough resolves.  Notify me if persistent.

## 2015-09-28 NOTE — Assessment & Plan Note (Signed)
Blood pressure under good control.  Continue same medication regimen.  Follow pressures.  Follow metabolic panel.   

## 2015-09-29 ENCOUNTER — Other Ambulatory Visit: Payer: Self-pay | Admitting: Internal Medicine

## 2015-09-29 DIAGNOSIS — M79605 Pain in left leg: Secondary | ICD-10-CM

## 2015-09-30 ENCOUNTER — Other Ambulatory Visit (INDEPENDENT_AMBULATORY_CARE_PROVIDER_SITE_OTHER): Payer: Medicare Other

## 2015-09-30 DIAGNOSIS — Z5181 Encounter for therapeutic drug level monitoring: Secondary | ICD-10-CM | POA: Diagnosis not present

## 2015-09-30 DIAGNOSIS — Z7901 Long term (current) use of anticoagulants: Secondary | ICD-10-CM | POA: Diagnosis not present

## 2015-09-30 LAB — PROTIME-INR
INR: 3.1 ratio — ABNORMAL HIGH (ref 0.8–1.0)
PROTHROMBIN TIME: 33.8 s — AB (ref 9.6–13.1)

## 2015-10-04 DIAGNOSIS — S86812A Strain of other muscle(s) and tendon(s) at lower leg level, left leg, initial encounter: Secondary | ICD-10-CM | POA: Diagnosis not present

## 2015-10-05 ENCOUNTER — Telehealth: Payer: Self-pay | Admitting: Internal Medicine

## 2015-10-05 NOTE — Telephone Encounter (Signed)
Received vm from [facility] stating that the patient did not know what she was being referred for and that they didn't tell her what it was for and told her that they would call her back. I called the patient to let her know what she was referred for and she states that she saw Dr. Mack Guise yesterday and was told that she broke her muscle in her leg and has to do PT for 2 months. I have requested the notes from Dr. Mliss Sax office. She wants to know if she still needs to go see Dr. Lucky Cowboy. Please advise.

## 2015-10-06 NOTE — Telephone Encounter (Signed)
Will hold on appt with Dr Lucky Cowboy since saw ortho and plan for PT.  Please tell her to call and let us know if any problems or increased swelling or redness.  Thanks

## 2015-10-07 ENCOUNTER — Other Ambulatory Visit: Payer: Self-pay

## 2015-10-07 DIAGNOSIS — Z7901 Long term (current) use of anticoagulants: Secondary | ICD-10-CM

## 2015-10-08 ENCOUNTER — Other Ambulatory Visit (INDEPENDENT_AMBULATORY_CARE_PROVIDER_SITE_OTHER): Payer: Medicare Other

## 2015-10-08 DIAGNOSIS — Z7901 Long term (current) use of anticoagulants: Secondary | ICD-10-CM

## 2015-10-08 LAB — PROTIME-INR
INR: 2.4 ratio — ABNORMAL HIGH (ref 0.8–1.0)
Prothrombin Time: 25.5 s — ABNORMAL HIGH (ref 9.6–13.1)

## 2015-10-11 ENCOUNTER — Telehealth: Payer: Self-pay | Admitting: *Deleted

## 2015-10-11 DIAGNOSIS — S86812D Strain of other muscle(s) and tendon(s) at lower leg level, left leg, subsequent encounter: Secondary | ICD-10-CM | POA: Diagnosis not present

## 2015-10-11 NOTE — Telephone Encounter (Signed)
Left vm to return our call 

## 2015-10-11 NOTE — Telephone Encounter (Signed)
Patient requested lab results from 08/04 She will be home after 3 pm

## 2015-10-11 NOTE — Telephone Encounter (Signed)
Notified pt. 

## 2015-10-15 ENCOUNTER — Encounter: Payer: Self-pay | Admitting: Sports Medicine

## 2015-10-15 ENCOUNTER — Ambulatory Visit (INDEPENDENT_AMBULATORY_CARE_PROVIDER_SITE_OTHER): Payer: Medicare Other | Admitting: Sports Medicine

## 2015-10-15 ENCOUNTER — Telehealth: Payer: Self-pay

## 2015-10-15 DIAGNOSIS — M79676 Pain in unspecified toe(s): Secondary | ICD-10-CM

## 2015-10-15 DIAGNOSIS — Z853 Personal history of malignant neoplasm of breast: Secondary | ICD-10-CM

## 2015-10-15 DIAGNOSIS — E119 Type 2 diabetes mellitus without complications: Secondary | ICD-10-CM

## 2015-10-15 DIAGNOSIS — S86812D Strain of other muscle(s) and tendon(s) at lower leg level, left leg, subsequent encounter: Secondary | ICD-10-CM | POA: Diagnosis not present

## 2015-10-15 DIAGNOSIS — B351 Tinea unguium: Secondary | ICD-10-CM | POA: Diagnosis not present

## 2015-10-15 DIAGNOSIS — I739 Peripheral vascular disease, unspecified: Secondary | ICD-10-CM

## 2015-10-15 DIAGNOSIS — Z1239 Encounter for other screening for malignant neoplasm of breast: Secondary | ICD-10-CM

## 2015-10-15 NOTE — Progress Notes (Signed)
[name], female   DOB: [birth year], 75 y.o.   MRN: [id number] Subjective: Anita Walker is a 75 y.o. female patient with history of type 2 diabetes who presents to office today complaining of long, painful nails  while ambulating in shoes; unable to trim. Patient states that the glucose reading this morning was not recorded. Patient denies any new changes in medication. Patient denies any new cramping, numbness, burning or tingling in the legs.  Admits to currently being in PT at [facility] after thigh muscle strain with improvement.  Patient Active Problem List   Diagnosis Date Noted  . Left leg pain 09/27/2015  . Cough 09/03/2015  . Dry lips 07/06/2015  . Pain of finger of right hand 05/23/2015  . Finger pain, right 05/20/2015  . Health care maintenance 05/23/2014  . Adjustment disorder with mixed anxiety and depressed mood 07/20/2013  . Chronic anticoagulation 04/11/2013  . Long term current use of anticoagulant 04/11/2013  . Hypertension 01/01/2012  . Diabetes mellitus ([place]) 01/01/2012  . Hypercholesteremia 01/01/2012  . Breast cancer ([place]) 01/01/2012  . Essential (primary) hypertension 01/01/2012  . Malignant neoplasm of female breast ([place]) 01/01/2012  . Pure hypercholesterolemia 01/01/2012  . Controlled type 2 diabetes mellitus without complication ([place]) [id number]   Current Outpatient Prescriptions on File Prior to Visit  Medication Sig Dispense Refill  . ferrous sulfate 325 (65 FE) MG tablet TAKE ONE (1) TABLET BY MOUTH EVERY DAY 90 tablet 3  . losartan (COZAAR) 50 MG tablet Take 1 tablet (50 mg total) by mouth daily. 30 tablet 1  . pantoprazole (PROTONIX) 40 MG tablet TAKE ONE (1) TABLET BY MOUTH EVERY DAY 90 tablet 0  . simvastatin (ZOCOR) 10 MG tablet TAKE ONE (1) TABLET EACH DAY 90 tablet 1  . warfarin (COUMADIN) 2.5 MG tablet TAKE 2 TABLETS BY MOUTH ON TUESDAY, THURSDAY, AND SATURDAY, AND 1 TABLET ON ALL OTHER DAYS 120 tablet 1   No current  facility-administered medications on file prior to visit.    Allergies  Allergen Reactions  . Penicillins Other (See Comments)    Had a reaction a long time ago.  Marland Kitchen Penicillin V Potassium Nausea And Vomiting    Objective: General: Patient is awake, alert, and oriented x 3 and in no acute distress.  Integument: Skin is warm, dry and supple bilateral. Nails are tender, long, thickened and  dystrophic with subungual debris, consistent with onychomycosis, 1-5 bilateral. No signs of infection. No open lesions or preulcerative lesions present bilateral. Remaining integument unremarkable.  Vasculature:  Dorsalis Pedis pulse 1/4 bilateral. Posterior Tibial pulse  1/4 bilateral.  Capillary fill time <3 sec 1-5 bilateral. Scant hair growth to the level of the digits. Temperature gradient within normal limits. Mild varicosities present bilateral. Trace edema present with venous hyperpigmentation bilateral.   Neurology: The patient has intact sensation measured with a 5.07/10g Semmes Weinstein Monofilament at all pedal sites bilateral . Vibratory sensation slightly diminished bilateral with tuning fork. No Babinski sign present bilateral.   Musculoskeletal: No gross pedal deformities noted bilateral. Muscular strength 5/5 in all lower extremity muscular groups bilateral without pain or limitation on range of motion . No tenderness with calf compression bilateral.  Assessment and Plan: Problem List Items Addressed This Visit    None    Visit Diagnoses    Dermatophytosis of nail    -  Primary   PVD (peripheral vascular disease) ([place])       Diabetes mellitus without complication ([place])  Pain of toe, unspecified laterality         -Examined patient. -Discussed and educated patient on diabetic foot care, especially with  regards to the vascular, neurological and musculoskeletal systems.  -Stressed the importance of good glycemic control and the detriment of not  controlling glucose levels in  relation to the foot. -Mechanically debrided all nails 1-5 bilateral using sterile nail nipper and filed with dremel without incident  -Encouraged lower limb elevation to assist with trace edema control bilateral -Continue with PT for thigh  -Answered all patient questions -Patient to return in 10 weeks for at risk foot care  -Patient advised to call the office if any problems or questions arise in the meantime.  Landis Martins, DPM

## 2015-10-15 NOTE — Telephone Encounter (Signed)
Order placed for mammogram.  See pts phone message.  She prefers Monday - Thursday pm or Friday anytime.

## 2015-10-15 NOTE — Telephone Encounter (Signed)
Patient received notice that her mammogram is due in September and she wants to get this set up. She states Dr Nicki Anita Walker usually does this for her. She is available in the afternoon Monday through Thursday and Friday all day. Please let patient know what she should do.-aa

## 2015-10-18 DIAGNOSIS — S86812D Strain of other muscle(s) and tendon(s) at lower leg level, left leg, subsequent encounter: Secondary | ICD-10-CM | POA: Diagnosis not present

## 2015-10-19 ENCOUNTER — Other Ambulatory Visit: Payer: Self-pay

## 2015-10-19 DIAGNOSIS — Z7901 Long term (current) use of anticoagulants: Principal | ICD-10-CM

## 2015-10-19 DIAGNOSIS — Z5181 Encounter for therapeutic drug level monitoring: Secondary | ICD-10-CM

## 2015-10-20 ENCOUNTER — Ambulatory Visit: Payer: BLUE CROSS/BLUE SHIELD | Admitting: Internal Medicine

## 2015-10-20 ENCOUNTER — Other Ambulatory Visit (INDEPENDENT_AMBULATORY_CARE_PROVIDER_SITE_OTHER): Payer: Medicare Other

## 2015-10-20 DIAGNOSIS — Z7901 Long term (current) use of anticoagulants: Secondary | ICD-10-CM

## 2015-10-20 DIAGNOSIS — Z5181 Encounter for therapeutic drug level monitoring: Secondary | ICD-10-CM

## 2015-10-20 LAB — PROTIME-INR
INR: 2.2 ratio — ABNORMAL HIGH (ref 0.8–1.0)
PROTHROMBIN TIME: 23.2 s — AB (ref 9.6–13.1)

## 2015-10-21 ENCOUNTER — Telehealth: Payer: Self-pay | Admitting: Internal Medicine

## 2015-10-21 DIAGNOSIS — S86812D Strain of other muscle(s) and tendon(s) at lower leg level, left leg, subsequent encounter: Secondary | ICD-10-CM | POA: Diagnosis not present

## 2015-10-21 NOTE — Telephone Encounter (Signed)
Pt called back returning call regarding lab results. Thank you!  Call pt @ [phone] .

## 2015-10-21 NOTE — Telephone Encounter (Signed)
Patient was informed of results.  Patient understood and no questions, comments, or concerns at this time.  

## 2015-10-22 ENCOUNTER — Other Ambulatory Visit: Payer: Self-pay | Admitting: Internal Medicine

## 2015-10-27 DIAGNOSIS — S86812D Strain of other muscle(s) and tendon(s) at lower leg level, left leg, subsequent encounter: Secondary | ICD-10-CM | POA: Diagnosis not present

## 2015-10-28 ENCOUNTER — Other Ambulatory Visit: Payer: Self-pay | Admitting: Internal Medicine

## 2015-10-28 DIAGNOSIS — Z1239 Encounter for other screening for malignant neoplasm of breast: Secondary | ICD-10-CM

## 2015-10-28 NOTE — Addendum Note (Signed)
Addended by: Leeanne Rio on: 10/28/2015 03:24 PM   Modules accepted: Orders

## 2015-10-28 NOTE — Progress Notes (Signed)
Updated diagnosis code for screening mammogram order

## 2015-10-29 ENCOUNTER — Encounter: Payer: Self-pay | Admitting: Internal Medicine

## 2015-10-29 DIAGNOSIS — S86812D Strain of other muscle(s) and tendon(s) at lower leg level, left leg, subsequent encounter: Secondary | ICD-10-CM | POA: Diagnosis not present

## 2015-11-03 DIAGNOSIS — S86812D Strain of other muscle(s) and tendon(s) at lower leg level, left leg, subsequent encounter: Secondary | ICD-10-CM | POA: Diagnosis not present

## 2015-11-05 ENCOUNTER — Other Ambulatory Visit: Payer: Self-pay | Admitting: Internal Medicine

## 2015-11-05 DIAGNOSIS — S86812D Strain of other muscle(s) and tendon(s) at lower leg level, left leg, subsequent encounter: Secondary | ICD-10-CM | POA: Diagnosis not present

## 2015-11-09 ENCOUNTER — Other Ambulatory Visit: Payer: Self-pay

## 2015-11-09 DIAGNOSIS — Z5181 Encounter for therapeutic drug level monitoring: Secondary | ICD-10-CM

## 2015-11-09 DIAGNOSIS — S86812D Strain of other muscle(s) and tendon(s) at lower leg level, left leg, subsequent encounter: Secondary | ICD-10-CM | POA: Diagnosis not present

## 2015-11-09 DIAGNOSIS — Z7901 Long term (current) use of anticoagulants: Principal | ICD-10-CM

## 2015-11-10 ENCOUNTER — Other Ambulatory Visit (INDEPENDENT_AMBULATORY_CARE_PROVIDER_SITE_OTHER): Payer: Medicare Other

## 2015-11-10 DIAGNOSIS — Z7901 Long term (current) use of anticoagulants: Secondary | ICD-10-CM | POA: Diagnosis not present

## 2015-11-10 DIAGNOSIS — Z5181 Encounter for therapeutic drug level monitoring: Secondary | ICD-10-CM

## 2015-11-10 LAB — PROTIME-INR
INR: 2 ratio — ABNORMAL HIGH (ref 0.8–1.0)
Prothrombin Time: 20.9 s — ABNORMAL HIGH (ref 9.6–13.1)

## 2015-11-11 ENCOUNTER — Telehealth: Payer: Self-pay | Admitting: *Deleted

## 2015-11-11 DIAGNOSIS — Z7901 Long term (current) use of anticoagulants: Secondary | ICD-10-CM

## 2015-11-11 NOTE — Telephone Encounter (Signed)
Standing lab order placed for PT/NR

## 2015-11-12 ENCOUNTER — Telehealth: Payer: Self-pay | Admitting: *Deleted

## 2015-11-12 DIAGNOSIS — S86812D Strain of other muscle(s) and tendon(s) at lower leg level, left leg, subsequent encounter: Secondary | ICD-10-CM | POA: Diagnosis not present

## 2015-11-12 NOTE — Telephone Encounter (Signed)
Pt stated that she missed a call, through voicemail she was advised to call the office.  Pt Contact [phone]

## 2015-11-12 NOTE — Telephone Encounter (Signed)
Patient called by mistake per patient.

## 2015-11-20 ENCOUNTER — Other Ambulatory Visit: Payer: Self-pay | Admitting: Internal Medicine

## 2015-11-29 ENCOUNTER — Ambulatory Visit
Admission: RE | Admit: 2015-11-29 | Discharge: 2015-11-29 | Disposition: A | Discharge status: Home / Self Care | Payer: Medicare Other | Source: Ambulatory Visit | Attending: Internal Medicine | Admitting: Internal Medicine

## 2015-11-29 ENCOUNTER — Other Ambulatory Visit: Payer: Self-pay | Admitting: Internal Medicine

## 2015-11-29 DIAGNOSIS — Z1231 Encounter for screening mammogram for malignant neoplasm of breast: Secondary | ICD-10-CM | POA: Insufficient documentation

## 2015-11-29 DIAGNOSIS — Z1239 Encounter for other screening for malignant neoplasm of breast: Secondary | ICD-10-CM

## 2015-11-29 LAB — HM MAMMOGRAPHY

## 2015-12-02 ENCOUNTER — Other Ambulatory Visit (INDEPENDENT_AMBULATORY_CARE_PROVIDER_SITE_OTHER): Payer: Medicare Other

## 2015-12-02 ENCOUNTER — Ambulatory Visit (INDEPENDENT_AMBULATORY_CARE_PROVIDER_SITE_OTHER): Payer: Medicare Other

## 2015-12-02 DIAGNOSIS — Z23 Encounter for immunization: Secondary | ICD-10-CM | POA: Diagnosis not present

## 2015-12-02 DIAGNOSIS — Z7901 Long term (current) use of anticoagulants: Secondary | ICD-10-CM

## 2015-12-02 LAB — PROTIME-INR
INR: 2.1 ratio — ABNORMAL HIGH (ref 0.8–1.0)
Prothrombin Time: 22.3 s — ABNORMAL HIGH (ref 9.6–13.1)

## 2015-12-24 ENCOUNTER — Encounter: Payer: Self-pay | Admitting: Internal Medicine

## 2015-12-24 ENCOUNTER — Ambulatory Visit (INDEPENDENT_AMBULATORY_CARE_PROVIDER_SITE_OTHER): Payer: Medicare Other | Admitting: Podiatry

## 2015-12-24 ENCOUNTER — Ambulatory Visit (INDEPENDENT_AMBULATORY_CARE_PROVIDER_SITE_OTHER): Payer: Medicare Other | Admitting: Internal Medicine

## 2015-12-24 VITALS — BP 126/80 | HR 80 | Temp 98.5°F | Ht 69.0 in | Wt 236.4 lb

## 2015-12-24 DIAGNOSIS — I10 Essential (primary) hypertension: Secondary | ICD-10-CM

## 2015-12-24 DIAGNOSIS — Z5181 Encounter for therapeutic drug level monitoring: Secondary | ICD-10-CM | POA: Diagnosis not present

## 2015-12-24 DIAGNOSIS — M79605 Pain in left leg: Secondary | ICD-10-CM

## 2015-12-24 DIAGNOSIS — E7800 Pure hypercholesterolemia, unspecified: Secondary | ICD-10-CM | POA: Diagnosis not present

## 2015-12-24 DIAGNOSIS — M79676 Pain in unspecified toe(s): Secondary | ICD-10-CM

## 2015-12-24 DIAGNOSIS — Z7901 Long term (current) use of anticoagulants: Secondary | ICD-10-CM

## 2015-12-24 DIAGNOSIS — M79609 Pain in unspecified limb: Secondary | ICD-10-CM

## 2015-12-24 DIAGNOSIS — B351 Tinea unguium: Secondary | ICD-10-CM

## 2015-12-24 DIAGNOSIS — E0843 Diabetes mellitus due to underlying condition with diabetic autonomic (poly)neuropathy: Secondary | ICD-10-CM

## 2015-12-24 DIAGNOSIS — L608 Other nail disorders: Secondary | ICD-10-CM

## 2015-12-24 DIAGNOSIS — L603 Nail dystrophy: Secondary | ICD-10-CM

## 2015-12-24 DIAGNOSIS — Z853 Personal history of malignant neoplasm of breast: Secondary | ICD-10-CM

## 2015-12-24 DIAGNOSIS — E119 Type 2 diabetes mellitus without complications: Secondary | ICD-10-CM

## 2015-12-24 LAB — PROTIME-INR
INR: 2.2 ratio — AB (ref 0.8–1.0)
PROTHROMBIN TIME: 23.1 s — AB (ref 9.6–13.1)

## 2015-12-24 MED ORDER — LOSARTAN POTASSIUM 50 MG PO TABS: ORAL_TABLET | ORAL | 1 refills | Status: DC

## 2015-12-24 NOTE — Progress Notes (Signed)
[name], female   DOB: 1940/07/22, 75 y.o.   MRN: [id number]   Subjective:    [name], female    DOB: [birth year], 75 y.o.   MRN: [id number]  HPI  Patient here for a scheduled follow up.  She reports she is doing well.  Her leg is doing better.  Went to therapy.  Helped.  Overall feels good.  Going to the flea market four days a week.  This gets her out of the house.  Handling stress.  No chest pain. No sob.  No acid reflux.  No abdominal pain or cramping.  Bowels stable.     Past Medical History:  Diagnosis Date  . Anemia   . Atrial fibrillation ([place])   . Breast cancer ([place])    s/p lumpectomy 1992.  s/p chemo and xrt  . Diabetes mellitus ([place])   . Gastric ulcer   . GERD (gastroesophageal reflux disease)   . Hypercholesterolemia   . Hypertension   . Pulmonary emboli [facility])    Past Surgical History:  Procedure Laterality Date  . BREAST LUMPECTOMY  03/06/1990   left breast   Family History  Problem Relation Age of Onset  . Heart disease Father   . Heart disease Brother     s/p CABG  . Colon cancer Neg Hx    Social History   Social History  . Marital status: Married    Spouse name: N/A  . Number of children: N/A  . Years of education: N/A   Social History Main Topics  . Smoking status: Never Smoker  . Smokeless tobacco: Never Used  . Alcohol use No  . Drug use: No  . Sexual activity: Not Asked   Other Topics Concern  . None   Social History Narrative  . None    Outpatient Encounter Prescriptions as of 12/24/2015  Medication Sig  . ferrous sulfate 325 (65 FE) MG tablet TAKE ONE (1) TABLET BY MOUTH EVERY DAY  . losartan (COZAAR) 50 MG tablet TAKE ONE (1) TABLET EACH DAY  . pantoprazole (PROTONIX) 40 MG tablet TAKE ONE (1) TABLET BY MOUTH EVERY DAY  . simvastatin (ZOCOR) 10 MG tablet TAKE ONE (1) TABLET EACH DAY  . warfarin (COUMADIN) 2.5 MG tablet TAKE 2 TABLETS BY MOUTH ON TUESDAY, THURSDAY, AND SATURDAY, AND 1  TABLET ON ALL OTHER DAYS  . [DISCONTINUED] losartan (COZAAR) 50 MG tablet TAKE ONE (1) TABLET EACH DAY   No facility-administered encounter medications on file as of 12/24/2015.     Review of Systems  Constitutional: Negative for appetite change and unexpected weight change.  HENT: Negative for congestion and sinus pressure.   Respiratory: Negative for cough, chest tightness and shortness of breath.   Cardiovascular: Negative for chest pain, palpitations and leg swelling.  Gastrointestinal: Negative for abdominal pain, diarrhea, nausea and vomiting.  Genitourinary: Negative for difficulty urinating and dysuria.  Musculoskeletal: Negative for back pain and joint swelling.  Skin: Negative for color change and rash.  Neurological: Negative for dizziness, light-headedness and headaches.  Psychiatric/Behavioral: Negative for agitation and dysphoric mood.       Objective:     Blood pressure rechecked by me:  126/78  Physical Exam  Constitutional: She appears well-developed and well-nourished. No distress.  HENT:  Nose: Nose normal.  Mouth/Throat: Oropharynx is clear and moist.  Neck: Neck supple. No thyromegaly present.  Cardiovascular: Normal rate and regular rhythm.   Pulmonary/Chest: Breath sounds normal. No respiratory  distress. She has no wheezes.  Abdominal: Soft. Bowel sounds are normal. There is no tenderness.  Musculoskeletal: She exhibits no edema or tenderness.  Lymphadenopathy:    She has no cervical adenopathy.  Skin: No rash noted. No erythema.  Psychiatric: She has a normal mood and affect. Her behavior is normal.    BP 126/80   Pulse 80   Temp 98.5 F (36.9 C) (Oral)   Ht _0  (1.753 m)   Wt 236 lb 6.4 oz (107.2 kg)   SpO2 95%   BMI 34.91 kg/m  Wt Readings from Last 3 Encounters:  12/24/15 236 lb 6.4 oz (107.2 kg)  09/27/15 237 lb (107.5 kg)  09/26/15 235 lb (106.6 kg)     Lab Results  Component Value Date   WBC 8.5 05/20/2015   HGB 14.5  05/20/2015   HCT 43.2 05/20/2015   PLT 249.0 05/20/2015   GLUCOSE 108 (H) 05/26/2015   CHOL 145 10/02/2014   TRIG 106.0 10/02/2014   HDL 53.50 10/02/2014   LDLCALC 71 10/02/2014   ALT 19 05/26/2015   AST 24 05/26/2015   NA 138 05/26/2015   K 4.2 05/26/2015   CL 103 05/26/2015   CREATININE 1.02 05/26/2015   BUN 21 05/26/2015   CO2 27 05/26/2015   TSH 3.31 06/10/2014   INR 2.2 (H) 12/24/2015   HGBA1C 5.8 10/02/2014   MICROALBUR 0.1 02/02/2014    Mm Screening Breast Tomo Bilateral  Result Date: 11/29/2015 CLINICAL DATA:  Screening. EXAM: 2D DIGITAL SCREENING BILATERAL MAMMOGRAM WITH CAD AND ADJUNCT TOMO COMPARISON:  Previous exam(s). ACR Breast Density Category b: There are scattered areas of fibroglandular density. FINDINGS: There are no findings suspicious for malignancy. Images were processed with CAD. IMPRESSION: No mammographic evidence of malignancy. A result letter of this screening mammogram will be mailed directly to the patient. RECOMMENDATION: Screening mammogram in one year. (Code:SM-B-01Y) BI-RADS CATEGORY  1: Negative. Electronically Signed   By: Marin Olp M.D.   On: 11/29/2015 15:31       Assessment & Plan:   Problem List Items Addressed This Visit    Chronic anticoagulation    Check pt/inr today.  History of bilateral pulmonary emboli.        Diabetes mellitus ([place])    Low carb diet and exercise.  Follow met b and a1c.        Relevant Medications   losartan (COZAAR) 50 MG tablet   History of breast cancer    Mammogram 11/29/15 birads I.       Hypercholesteremia    Low cholesterol diet and exercise.  On simvastatin.  Follow lipid panel and liver function tests.        Relevant Medications   losartan (COZAAR) 50 MG tablet   Hypertension    Blood pressure under good control.  Continue same medication regimen.  Follow pressures.  Follow metabolic panel.        Relevant Medications   losartan (COZAAR) 50 MG tablet   Left leg pain    Leg pain - not  a significant issue for her now.  Much improved.  Doing well.  Follow.  Went to therapy.         Other Visit Diagnoses    Anticoagulated on Coumadin    -  Primary   Relevant Orders   Protime-INR (Completed)       Einar Pheasant, MD

## 2015-12-24 NOTE — Progress Notes (Signed)
SUBJECTIVE Patient with a history of diabetes mellitus presents to office today complaining of elongated, thickened nails. Pain while ambulating in shoes. Patient is unable to trim their own nails.   Allergies  Allergen Reactions  . Penicillins Other (See Comments)    Had a reaction a long time ago.  Marland Kitchen Penicillin V Potassium Nausea And Vomiting    OBJECTIVE General Patient is awake, alert, and oriented x 3 and in no acute distress. Derm Skin is dry and supple bilateral. Negative open lesions or macerations. Remaining integument unremarkable. Nails are tender, long, thickened and dystrophic with subungual debris, consistent with onychomycosis, 1-5 bilateral. No signs of infection noted. Vasc  DP and PT pedal pulses palpable bilaterally. Temperature gradient within normal limits.  Neuro Epicritic and protective threshold sensation diminished bilaterally.  Musculoskeletal Exam No symptomatic pedal deformities noted bilateral. Muscular strength within normal limits.  ASSESSMENT 1. Diabetes Mellitus w/ peripheral neuropathy 2. Onychomycosis of nail due to dermatophyte bilateral 3. Pain in foot bilateral  PLAN OF CARE 1. Patient evaluated today. 2. Instructed to maintain good pedal hygiene and foot care. Stressed importance of controlling blood sugar.  3. Mechanical debridement of nails 1-5 bilaterally performed using a nail nipper. Filed with dremel without incident.  4. Return to clinic in 3 mos.    Edrick Kins, DPM

## 2015-12-24 NOTE — Progress Notes (Signed)
Pre visit review using our clinic review tool, if applicable. No additional management support is needed unless otherwise documented below in the visit note. 

## 2015-12-25 ENCOUNTER — Encounter: Payer: Self-pay | Admitting: Internal Medicine

## 2015-12-25 ENCOUNTER — Other Ambulatory Visit: Payer: Self-pay | Admitting: Internal Medicine

## 2015-12-25 DIAGNOSIS — E7800 Pure hypercholesterolemia, unspecified: Secondary | ICD-10-CM

## 2015-12-25 DIAGNOSIS — I10 Essential (primary) hypertension: Secondary | ICD-10-CM

## 2015-12-25 DIAGNOSIS — Z7901 Long term (current) use of anticoagulants: Secondary | ICD-10-CM

## 2015-12-25 DIAGNOSIS — E119 Type 2 diabetes mellitus without complications: Secondary | ICD-10-CM

## 2015-12-25 NOTE — Assessment & Plan Note (Signed)
Blood pressure under good control.  Continue same medication regimen.  Follow pressures.  Follow metabolic panel.   

## 2015-12-25 NOTE — Assessment & Plan Note (Signed)
Check pt/inr today.  History of bilateral pulmonary emboli.

## 2015-12-25 NOTE — Assessment & Plan Note (Signed)
Low cholesterol diet and exercise.  On simvastatin.  Follow lipid panel and liver function tests.   

## 2015-12-25 NOTE — Assessment & Plan Note (Signed)
Mammogram 11/29/15 birads I.

## 2015-12-25 NOTE — Assessment & Plan Note (Signed)
Leg pain - not a significant issue for her now.  Much improved.  Doing well.  Follow.  Went to therapy.

## 2015-12-25 NOTE — Assessment & Plan Note (Signed)
Low carb diet and exercise.  Follow met b and a1c.   

## 2015-12-25 NOTE — Progress Notes (Signed)
Orders placed for f/u labs.  

## 2015-12-27 ENCOUNTER — Telehealth: Payer: Self-pay | Admitting: Internal Medicine

## 2015-12-27 NOTE — Telephone Encounter (Signed)
Pt called returning your call. Thank you!  Call pt @ [phone]

## 2015-12-31 ENCOUNTER — Other Ambulatory Visit: Payer: Medicare Other

## 2016-01-04 ENCOUNTER — Telehealth: Payer: Self-pay | Admitting: *Deleted

## 2016-01-04 NOTE — Telephone Encounter (Signed)
Pt stated that she had a call to call the office, she wasn't sure for what reason I Did not see any notes from today

## 2016-01-04 NOTE — Telephone Encounter (Signed)
Informed patient I had not called her.

## 2016-01-07 DIAGNOSIS — E119 Type 2 diabetes mellitus without complications: Secondary | ICD-10-CM | POA: Diagnosis not present

## 2016-01-07 LAB — HM DIABETES EYE EXAM

## 2016-01-12 ENCOUNTER — Encounter: Payer: Self-pay | Admitting: Internal Medicine

## 2016-01-21 ENCOUNTER — Other Ambulatory Visit (INDEPENDENT_AMBULATORY_CARE_PROVIDER_SITE_OTHER): Payer: Medicare Other

## 2016-01-21 DIAGNOSIS — E7800 Pure hypercholesterolemia, unspecified: Secondary | ICD-10-CM | POA: Diagnosis not present

## 2016-01-21 DIAGNOSIS — Z5181 Encounter for therapeutic drug level monitoring: Secondary | ICD-10-CM

## 2016-01-21 DIAGNOSIS — I10 Essential (primary) hypertension: Secondary | ICD-10-CM | POA: Diagnosis not present

## 2016-01-21 DIAGNOSIS — E119 Type 2 diabetes mellitus without complications: Secondary | ICD-10-CM | POA: Diagnosis not present

## 2016-01-21 DIAGNOSIS — Z7901 Long term (current) use of anticoagulants: Secondary | ICD-10-CM | POA: Diagnosis not present

## 2016-01-21 LAB — CBC WITH DIFFERENTIAL/PLATELET
Basophils Absolute: 0 10*3/uL (ref 0.0–0.1)
Basophils Relative: 0.4 % (ref 0.0–3.0)
EOS ABS: 0.2 10*3/uL (ref 0.0–0.7)
EOS PCT: 2.2 % (ref 0.0–5.0)
HCT: 41.4 % (ref 36.0–46.0)
Hemoglobin: 14 g/dL (ref 12.0–15.0)
LYMPHS ABS: 1.8 10*3/uL (ref 0.7–4.0)
Lymphocytes Relative: 26.4 % (ref 12.0–46.0)
MCHC: 33.8 g/dL (ref 30.0–36.0)
MCV: 88.5 fl (ref 78.0–100.0)
MONO ABS: 1 10*3/uL (ref 0.1–1.0)
Monocytes Relative: 13.9 % — ABNORMAL HIGH (ref 3.0–12.0)
NEUTROS PCT: 57.1 % (ref 43.0–77.0)
Neutro Abs: 3.9 10*3/uL (ref 1.4–7.7)
Platelets: 220 10*3/uL (ref 150.0–400.0)
RBC: 4.67 Mil/uL (ref 3.87–5.11)
RDW: 14.5 % (ref 11.5–15.5)
WBC: 6.9 10*3/uL (ref 4.0–10.5)

## 2016-01-21 LAB — HEPATIC FUNCTION PANEL
ALBUMIN: 4 g/dL (ref 3.5–5.2)
ALT: 25 U/L (ref 0–35)
AST: 27 U/L (ref 0–37)
Alkaline Phosphatase: 39 U/L (ref 39–117)
Bilirubin, Direct: 0.1 mg/dL (ref 0.0–0.3)
Total Bilirubin: 0.6 mg/dL (ref 0.2–1.2)
Total Protein: 6.8 g/dL (ref 6.0–8.3)

## 2016-01-21 LAB — MICROALBUMIN / CREATININE URINE RATIO
Creatinine,U: 18.8 mg/dL
MICROALB/CREAT RATIO: 3.7 mg/g (ref 0.0–30.0)

## 2016-01-21 LAB — BASIC METABOLIC PANEL
BUN: 15 mg/dL (ref 6–23)
CALCIUM: 9.5 mg/dL (ref 8.4–10.5)
CO2: 27 meq/L (ref 19–32)
CREATININE: 0.86 mg/dL (ref 0.40–1.20)
Chloride: 102 mEq/L (ref 96–112)
GFR: 67.86 mL/min (ref >60.00)
Glucose, Bld: 101 mg/dL — ABNORMAL HIGH (ref 70–99)
Potassium: 4.4 mEq/L (ref 3.5–5.1)
SODIUM: 137 meq/L (ref 135–145)

## 2016-01-21 LAB — TSH: TSH: 2.55 u[IU]/mL (ref 0.35–4.50)

## 2016-01-21 LAB — LIPID PANEL
CHOL/HDL RATIO: 2
Cholesterol: 142 mg/dL (ref 0–200)
HDL: 59.4 mg/dL (ref >39.00)
LDL CALC: 64 mg/dL (ref 0–99)
NonHDL: 82.64
Triglycerides: 95 mg/dL (ref 0.0–149.0)
VLDL: 19 mg/dL (ref 0.0–40.0)

## 2016-01-21 LAB — HEMOGLOBIN A1C: HEMOGLOBIN A1C: 5.7 % (ref 4.6–6.5)

## 2016-01-21 LAB — PROTIME-INR
INR: 2.1 ratio — ABNORMAL HIGH (ref 0.8–1.0)
Prothrombin Time: 22.2 s — ABNORMAL HIGH (ref 9.6–13.1)

## 2016-01-31 ENCOUNTER — Other Ambulatory Visit: Payer: Self-pay | Admitting: Internal Medicine

## 2016-02-11 ENCOUNTER — Ambulatory Visit (INDEPENDENT_AMBULATORY_CARE_PROVIDER_SITE_OTHER): Payer: Medicare Other

## 2016-02-11 VITALS — BP 122/70 | HR 82 | Temp 97.9°F | Resp 14 | Ht 67.0 in | Wt 237.1 lb

## 2016-02-11 DIAGNOSIS — E2839 Other primary ovarian failure: Secondary | ICD-10-CM

## 2016-02-11 DIAGNOSIS — Z0000 Encounter for general adult medical examination without abnormal findings: Secondary | ICD-10-CM

## 2016-02-11 DIAGNOSIS — Z23 Encounter for immunization: Secondary | ICD-10-CM | POA: Diagnosis not present

## 2016-02-11 NOTE — Patient Instructions (Addendum)
  [name] , Thank you for taking time to come for your Medicare Wellness Visit. I appreciate your ongoing commitment to your health goals. Please review the following plan we discussed and let me know if I can assist you in the future.   FOLLOW UP WITH DR. Nicki Reaper AS NEEDED.  These are the goals we discussed: Goals    . Healthy Lifestyle          Stay active and keep riding exercise bike. Stay hydrated and drink plenty of water. Low carb foods. Lean meats, vegetables.       This is a list of the screening recommended for you and due dates:  Health Maintenance  Topic Date Due  . Tetanus Vaccine  03/21/1957  . Shingles Vaccine  03/21/1998  . DEXA scan (bone density measurement)  03/22/2003  . Complete foot exam   08/31/2015  . Hemoglobin A1C  07/20/2016  . Mammogram  11/28/2016  . Eye exam for diabetics  01/06/2017  . Flu Shot  Completed  . Pneumonia vaccines  Completed

## 2016-02-11 NOTE — Progress Notes (Signed)
Subjective:   Anita Walker is a 75 y.o. female who presents for an Initial Medicare Annual Wellness Visit.  Review of Systems    No ROS.  Medicare Wellness Visit.  Cardiac Risk Factors include: advanced age (>8men, >22 women);hypertension;diabetes mellitus     Objective:    Today's Vitals   02/11/16 1442  BP: 122/70  Pulse: 82  Resp: 14  Temp: 97.9 F (36.6 C)  TempSrc: Oral  SpO2: 95%  Weight: 237 lb 1.9 oz (107.6 kg)  Height: 5\' 7"  (1.702 m)   Body mass index is 37.14 kg/m.   Current Medications (verified) Outpatient Encounter Prescriptions as of 02/11/2016  Medication Sig  . ferrous sulfate 325 (65 FE) MG tablet TAKE ONE (1) TABLET BY MOUTH EVERY DAY  . losartan (COZAAR) 50 MG tablet TAKE ONE (1) TABLET EACH DAY  . pantoprazole (PROTONIX) 40 MG tablet TAKE ONE TABLET BY MOUTH EVERY DAY  . simvastatin (ZOCOR) 10 MG tablet TAKE ONE (1) TABLET EACH DAY  . warfarin (COUMADIN) 2.5 MG tablet TAKE 2 TABLETS BY MOUTH ON TUESDAY, THURSDAY, AND SATURDAY, AND 1 TABLET ON ALL OTHER DAYS   No facility-administered encounter medications on file as of 02/11/2016.     Allergies (verified) Penicillins and Penicillin v potassium   History: Past Medical History:  Diagnosis Date  . Anemia   . Atrial fibrillation ([place])   . Breast cancer ([place])    s/p lumpectomy 1992.  s/p chemo and xrt  . Diabetes mellitus ([place])   . Gastric ulcer   . GERD (gastroesophageal reflux disease)   . Hypercholesterolemia   . Hypertension   . Pulmonary emboli [facility])    Past Surgical History:  Procedure Laterality Date  . BREAST LUMPECTOMY  03/06/1990   left breast   Family History  Problem Relation Age of Onset  . Heart disease Father   . Heart disease Brother     s/p CABG  . Colon cancer Neg Hx    Social History   Occupational History  . Not on file.   Social History Main Topics  . Smoking status: Never Smoker  . Smokeless tobacco: Never Used  . Alcohol use No  . Drug use:  No  . Sexual activity: Not on file    Tobacco Counseling Counseling given: Not Answered   Activities of Daily Living In your present state of health, do you have any difficulty performing the following activities: 02/11/2016  Hearing? Y  Vision? N  Difficulty concentrating or making decisions? N  Walking or climbing stairs? N  Dressing or bathing? N  Doing errands, shopping? N  Preparing Food and eating ? N  Using the Toilet? N  In the past six months, have you accidently leaked urine? N  Do you have problems with loss of bowel control? N  Managing your Medications? N  Managing your Finances? N  Housekeeping or managing your Housekeeping? N  Some recent data might be hidden    Immunizations and Health Maintenance Immunization History  Administered Date(s) Administered  . Influenza Split 11/19/2011, 12/09/2012  . Influenza, High Dose Seasonal PF 12/02/2015  . Influenza,inj,Quad PF,36+ Mos 12/03/2013, 11/12/2014  . Pneumococcal Conjugate-13 01/14/2014  . Pneumococcal Polysaccharide-23 02/11/2016   Health Maintenance Due  Topic Date Due  . TETANUS/TDAP  03/21/1957  . ZOSTAVAX  03/21/1998  . DEXA SCAN  03/22/2003  . FOOT EXAM  08/31/2015    Patient Care Team: Einar Pheasant, MD as PCP - General (Internal Medicine)  Indicate  any recent Medical Services you may have received from other than [facility] providers in the past year (date may be approximate).     Assessment:   This is a routine wellness examination for Anita Walker. The goal of the wellness visit is to assist the patient how to close the gaps in care and create a preventative care plan for the patient.   Osteoporosis risk reviewed.  DEXA SCAN ordered; follow as directed. Educational material provided.    Medications reviewed; taking without issues or barriers.  Safety issues reviewed; lives alone.  Smoke detectors in the home. No firearms in the home. Wears seatbelts when driving or riding with others. No  violence in the home.  No identified risk were noted; The patient was oriented x 3; appropriate in dress and manner and no objective failures at ADL's or IADL's.   BMI; discussed the importance of a healthy diet, water intake and exercise. Educational material provided.  Pneumonia 23 vaccine administered R deltoid, tolerated well.  Educational material provided.    TDAP and ZOSTAVAX vaccine deferred per patient request.   Patient Concerns: None at this time. Follow up with PCP as needed.  Hearing/Vision screen Hearing Screening Comments: Bilateral hearing aids Followed by [facility] Screening Comments: Followed by [facility]  Last 11/2015 Annual visits   Dietary issues and exercise activities discussed: Current Exercise Habits: Home exercise routine, Type of exercise: calisthenics (exercise bike), Time (Minutes): 10, Intensity: Mild  Goals    . Healthy Lifestyle          Stay active and keep riding exercise bike. Stay hydrated and drink plenty of water. Low carb foods. Lean meats, vegetables.      Depression Screen PHQ 2/9 Scores 02/11/2016 12/24/2015 09/16/2014 08/09/2012 04/06/2012  PHQ - 2 Score 0 0 0 0 0    Fall Risk Fall Risk  02/11/2016 12/24/2015 09/16/2014 08/09/2012 04/06/2012  Falls in the past year? No No No No No    Cognitive Function: MMSE - Mini Mental State Exam 02/11/2016  Not completed: Refused     6CIT Screen 02/11/2016  What Year? 0 points  What month? 0 points  What time? 0 points  Count back from 20 (No Data)  Months in reverse (No Data)  Repeat phrase 2 points    Screening Tests Health Maintenance  Topic Date Due  . TETANUS/TDAP  03/21/1957  . ZOSTAVAX  03/21/1998  . DEXA SCAN  03/22/2003  . FOOT EXAM  08/31/2015  . HEMOGLOBIN A1C  07/20/2016  . MAMMOGRAM  11/28/2016  . OPHTHALMOLOGY EXAM  01/06/2017  . INFLUENZA VACCINE  Completed  . PNA vac Low Risk Adult  Completed      Plan:    End of life planning; Advance aging;  Advanced directives discussed. Copy of current HCPOA/Living Will requested.  Medicare Attestation I have personally reviewed: The patient's medical and social history Their use of alcohol, tobacco or illicit drugs Their current medications and supplements The patient's functional ability including ADLs,fall risks, home safety risks, cognitive, and hearing and visual impairment Diet and physical activities Evidence for depression   The patient's weight, height, BMI, and visual acuity have been recorded in the chart.  I have made referrals and provided education to the patient based on review of the above and I have provided the patient with a written personalized care plan for preventive services.    During the course of the visit, Orell Dobbie was educated and counseled about the following appropriate screening  and preventive services:   Vaccines to include Pneumoccal, Influenza, Hepatitis B, Td, Zostavax, HCV  Electrocardiogram  Cardiovascular disease screening  Colorectal cancer screening  Bone density screening  Diabetes screening  Glaucoma screening  Mammography/PAP  Nutrition counseling  Smoking cessation counseling  Patient Instructions (the written plan) were given to the patient.    Varney Biles, LPN   [id number]    Reviewed above information.  Agree with plan.  Dr Nicki Reaper

## 2016-02-21 ENCOUNTER — Other Ambulatory Visit (INDEPENDENT_AMBULATORY_CARE_PROVIDER_SITE_OTHER): Payer: Medicare Other

## 2016-02-21 DIAGNOSIS — Z7901 Long term (current) use of anticoagulants: Secondary | ICD-10-CM

## 2016-02-21 LAB — PROTIME-INR
INR: 2.1 ratio — AB (ref 0.8–1.0)
PROTHROMBIN TIME: 22.7 s — AB (ref 9.6–13.1)

## 2016-03-08 ENCOUNTER — Encounter: Payer: Self-pay | Admitting: Internal Medicine

## 2016-03-17 ENCOUNTER — Ambulatory Visit (INDEPENDENT_AMBULATORY_CARE_PROVIDER_SITE_OTHER): Payer: Medicare Other | Admitting: Podiatry

## 2016-03-17 DIAGNOSIS — M79609 Pain in unspecified limb: Secondary | ICD-10-CM

## 2016-03-17 DIAGNOSIS — L608 Other nail disorders: Secondary | ICD-10-CM | POA: Diagnosis not present

## 2016-03-17 DIAGNOSIS — L600 Ingrowing nail: Secondary | ICD-10-CM | POA: Diagnosis not present

## 2016-03-17 DIAGNOSIS — E0843 Diabetes mellitus due to underlying condition with diabetic autonomic (poly)neuropathy: Secondary | ICD-10-CM | POA: Diagnosis not present

## 2016-03-17 DIAGNOSIS — L03039 Cellulitis of unspecified toe: Secondary | ICD-10-CM

## 2016-03-17 DIAGNOSIS — B351 Tinea unguium: Secondary | ICD-10-CM | POA: Diagnosis not present

## 2016-03-17 DIAGNOSIS — M79676 Pain in unspecified toe(s): Secondary | ICD-10-CM

## 2016-03-17 DIAGNOSIS — L603 Nail dystrophy: Secondary | ICD-10-CM

## 2016-03-17 NOTE — Patient Instructions (Signed)

## 2016-03-22 ENCOUNTER — Other Ambulatory Visit: Payer: BLUE CROSS/BLUE SHIELD

## 2016-03-22 NOTE — Progress Notes (Signed)
   Subjective: Patient presents today with a new complaint for evaluation of pain in toe(s). Patient is concerned for possible ingrown nail. Patient states that the pain has been present for a few weeks now. Patient presents today for further treatment and evaluation. Patient also presents as a diabetic for routine nail care. Patient states that her nails are elongated, thickened, discolored and are painful in shoe gear. Patient is unable to trim her own nails.  Objective:  General: Well developed, nourished, in no acute distress, alert and oriented x3   Dermatology: Hyperkeratotic, dystrophic nails noted 1-5 bilateral. Skin is warm, dry and supple bilateral. Right great toe medial border  appears to be erythematous with evidence of an ingrowing nail. Purulent drainage noted with intruding nail into the respective nail fold. Pain on palpation noted to the border of the nail fold. The remaining nails appear unremarkable at this time. There are no open sores, lesions.  Vascular: Dorsalis Pedis artery and Posterior Tibial artery pedal pulses palpable. No lower extremity edema noted.   Neruologic: Grossly intact via light touch bilateral.  Musculoskeletal: Muscular strength within normal limits in all groups bilateral. Normal range of motion noted to all pedal and ankle joints.   Assesement: #1 Paronychia with ingrowing nail right great toe medial border #2 Pain in toe #3 Incurvated nail #3 diabetes mellitus #4 painful onychomycosis of toenails 1-5 bilateral  Plan of Care:  1. Patient evaluated.  2. Discussed treatment alternatives and plan of care. Explained nail avulsion procedure and post procedure course to patient. 3. Patient opted for permanent partial nail avulsion.  4. Prior to procedure, local anesthesia infiltration utilized using 3 ml of a 50:50 mixture of 2% plain lidocaine and 0.5% plain marcaine in a normal hallux block fashion and a betadine prep performed.  5. Partial  permanent nail avulsion with chemical matrixectomy performed using [id number] applications of phenol followed by alcohol flush.  6. Light dressing applied. 7. Mechanical debridement of nails 1-5 bilateral nail nipper without incident or bleeding.  8. Return to clinic in 2 weeks.   Edrick Kins, DPM [facility]  Dr. Edrick Kins, [address]                                        [place], [place] [address]                Office [phone]  Fax [phone]

## 2016-03-24 ENCOUNTER — Other Ambulatory Visit (INDEPENDENT_AMBULATORY_CARE_PROVIDER_SITE_OTHER): Payer: Medicare Other

## 2016-03-24 DIAGNOSIS — Z7901 Long term (current) use of anticoagulants: Secondary | ICD-10-CM

## 2016-03-24 DIAGNOSIS — Z5181 Encounter for therapeutic drug level monitoring: Secondary | ICD-10-CM

## 2016-03-24 LAB — PROTIME-INR
INR: 2 ratio — ABNORMAL HIGH (ref 0.8–1.0)
Prothrombin Time: 21.1 s — ABNORMAL HIGH (ref 9.6–13.1)

## 2016-03-27 ENCOUNTER — Telehealth: Payer: Self-pay | Admitting: *Deleted

## 2016-03-27 DIAGNOSIS — Z7901 Long term (current) use of anticoagulants: Secondary | ICD-10-CM

## 2016-03-27 NOTE — Telephone Encounter (Signed)
Placed standing order for PT/INR 

## 2016-03-28 ENCOUNTER — Ambulatory Visit: Payer: Medicare Other | Admitting: Podiatry

## 2016-03-31 ENCOUNTER — Ambulatory Visit (INDEPENDENT_AMBULATORY_CARE_PROVIDER_SITE_OTHER): Payer: Medicare Other | Admitting: Podiatry

## 2016-03-31 DIAGNOSIS — S91209D Unspecified open wound of unspecified toe(s) with damage to nail, subsequent encounter: Secondary | ICD-10-CM

## 2016-03-31 DIAGNOSIS — S91109D Unspecified open wound of unspecified toe(s) without damage to nail, subsequent encounter: Secondary | ICD-10-CM | POA: Diagnosis not present

## 2016-03-31 DIAGNOSIS — M79676 Pain in unspecified toe(s): Secondary | ICD-10-CM

## 2016-03-31 LAB — HM DIABETES FOOT EXAM

## 2016-04-09 NOTE — Progress Notes (Signed)
   Subjective: Patient presents today 2 weeks post ingrown nail permanent nail avulsion procedure. Patient states that the toe and nail fold is feeling much better.  Objective: Skin is warm, dry and supple. Nail and respective nail fold appears to be healing appropriately. Open wound to the associated nail fold with a granular wound base and moderate amount of fibrotic tissue. Minimal drainage noted. Mild erythema around the periungual region likely due to phenol chemical matricectomy.  Assessment: #1 postop permanent partial nail avulsion right great toe medial border #2 open wound periungual nail fold of respective digit.   Plan of care: #1 patient was evaluated  #2 debridement of open wound was performed to the periungual border of the respective toe using a currette. Antibiotic ointment and Band-Aid was applied. #3 patient is to return to clinic on a PRN  basis.   Edrick Kins, DPM [facility]  Dr. Edrick Kins, [address]                                        [place], [place] [address]                Office [phone]  Fax [phone]

## 2016-04-19 ENCOUNTER — Ambulatory Visit
Admission: RE | Admit: 2016-04-19 | Discharge: 2016-04-19 | Disposition: A | Discharge status: Home / Self Care | Payer: Medicare Other | Source: Ambulatory Visit | Attending: Internal Medicine | Admitting: Internal Medicine

## 2016-04-19 DIAGNOSIS — E2839 Other primary ovarian failure: Secondary | ICD-10-CM

## 2016-04-19 DIAGNOSIS — M85851 Other specified disorders of bone density and structure, right thigh: Secondary | ICD-10-CM | POA: Diagnosis not present

## 2016-04-19 DIAGNOSIS — Z13820 Encounter for screening for osteoporosis: Secondary | ICD-10-CM | POA: Diagnosis not present

## 2016-04-19 DIAGNOSIS — Z853 Personal history of malignant neoplasm of breast: Secondary | ICD-10-CM | POA: Insufficient documentation

## 2016-04-19 DIAGNOSIS — M8580 Other specified disorders of bone density and structure, unspecified site: Secondary | ICD-10-CM | POA: Insufficient documentation

## 2016-04-19 LAB — HM DEXA SCAN

## 2016-04-24 ENCOUNTER — Other Ambulatory Visit (INDEPENDENT_AMBULATORY_CARE_PROVIDER_SITE_OTHER): Payer: Medicare Other

## 2016-04-24 ENCOUNTER — Other Ambulatory Visit: Payer: Medicare Other

## 2016-04-24 ENCOUNTER — Other Ambulatory Visit: Payer: Self-pay | Admitting: Internal Medicine

## 2016-04-24 DIAGNOSIS — Z7901 Long term (current) use of anticoagulants: Secondary | ICD-10-CM | POA: Diagnosis not present

## 2016-04-24 LAB — PROTIME-INR
INR: 2.3 ratio — ABNORMAL HIGH (ref 0.8–1.0)
PROTHROMBIN TIME: 24.3 s — AB (ref 9.6–13.1)

## 2016-04-28 ENCOUNTER — Encounter: Payer: BLUE CROSS/BLUE SHIELD | Admitting: Internal Medicine

## 2016-05-02 ENCOUNTER — Encounter: Payer: Self-pay | Admitting: Internal Medicine

## 2016-05-02 ENCOUNTER — Ambulatory Visit (INDEPENDENT_AMBULATORY_CARE_PROVIDER_SITE_OTHER): Payer: Medicare Other | Admitting: Internal Medicine

## 2016-05-02 DIAGNOSIS — E7800 Pure hypercholesterolemia, unspecified: Secondary | ICD-10-CM

## 2016-05-02 DIAGNOSIS — Z853 Personal history of malignant neoplasm of breast: Secondary | ICD-10-CM | POA: Diagnosis not present

## 2016-05-02 DIAGNOSIS — Z0000 Encounter for general adult medical examination without abnormal findings: Secondary | ICD-10-CM

## 2016-05-02 DIAGNOSIS — E119 Type 2 diabetes mellitus without complications: Secondary | ICD-10-CM

## 2016-05-02 DIAGNOSIS — I10 Essential (primary) hypertension: Secondary | ICD-10-CM

## 2016-05-02 NOTE — Progress Notes (Signed)
Pre-visit discussion using our clinic review tool. No additional management support is needed unless otherwise documented below in the visit note.  

## 2016-05-02 NOTE — Progress Notes (Signed)
[name], female   DOB: Aug 02, 1940, 76 y.o.   MRN: [id number]   Subjective:    [name], female    DOB: 1940-10-04, 76 y.o.   MRN: [id number]  HPI  Patient with past history of afib, breast cancer, diabetes, hypercholesterolemia and hypertension.  She comes in today to follow up on these issues as well as for a complete physical exam.  She reports she is doing well.  Feels good.  No chest pain.  No sob.  Feels her breathing is stable.  Going [place] the flea market several days per week.  Enjoys.  No acid reflux.  No abdominal pain.  Bowel stable.     Past Medical History:  Diagnosis Date  . Anemia   . Atrial fibrillation ([place])   . Breast cancer ([place])    s/p lumpectomy 1992.  s/p chemo and xrt  . Diabetes mellitus ([place])   . Gastric ulcer   . GERD (gastroesophageal reflux disease)   . Hypercholesterolemia   . Hypertension   . Pulmonary emboli [facility])    Past Surgical History:  Procedure Laterality Date  . BREAST LUMPECTOMY  03/06/1990   left breast   Family History  Problem Relation Age of Onset  . Heart disease Father   . Heart disease Brother     s/p CABG  . Colon cancer Neg Hx    Social History   Social History  . Marital status: Married    Spouse name: N/A  . Number of children: N/A  . Years of education: N/A   Social History Main Topics  . Smoking status: Never Smoker  . Smokeless tobacco: Never Used  . Alcohol use No  . Drug use: No  . Sexual activity: Not Asked   Other Topics Concern  . None   Social History Narrative  . None    Outpatient Encounter Prescriptions as of 05/02/2016  Medication Sig  . ferrous sulfate 325 (65 FE) MG tablet TAKE ONE (1) TABLET BY MOUTH EVERY DAY  . losartan (COZAAR) 50 MG tablet TAKE ONE (1) TABLET EACH DAY  . pantoprazole (PROTONIX) 40 MG tablet TAKE ONE TABLET BY MOUTH EVERY DAY  . simvastatin (ZOCOR) 10 MG tablet TAKE ONE (1) TABLET BY MOUTH EVERY DAY  . warfarin (COUMADIN) 2.5  MG tablet TAKE 2 TABLETS BY MOUTH ON TUESDAY, THURSDAY, AND SATURDAY, AND 1 TABLET ON ALL OTHER DAYS   No facility-administered encounter medications on file as of 05/02/2016.     Review of Systems  Constitutional: Negative for appetite change and unexpected weight change.  HENT: Negative for congestion and sinus pressure.   Eyes: Negative for pain and visual disturbance.  Respiratory: Negative for cough, chest tightness and shortness of breath.   Cardiovascular: Negative for chest pain, palpitations and leg swelling.  Gastrointestinal: Negative for abdominal pain, diarrhea, nausea and vomiting.  Genitourinary: Negative for difficulty urinating and dysuria.  Musculoskeletal: Negative for back pain and joint swelling.  Skin: Negative for color change and rash.  Neurological: Negative for dizziness, light-headedness and headaches.  Hematological: Negative for adenopathy. Does not bruise/bleed easily.  Psychiatric/Behavioral: Negative for agitation and dysphoric mood.       Objective:    Physical Exam  Constitutional: She is oriented to person, place, and time. She appears well-developed and well-nourished. No distress.  HENT:  Nose: Nose normal.  Mouth/Throat: Oropharynx is clear and moist.  Eyes: Right eye exhibits no discharge. Left eye exhibits no discharge.  No scleral icterus.  Neck: Neck supple. No thyromegaly present.  Cardiovascular: Normal rate and regular rhythm.   Pulmonary/Chest: Breath sounds normal. No accessory muscle usage. No tachypnea. No respiratory distress. She has no decreased breath sounds. She has no wheezes. She has no rhonchi. Right breast exhibits no inverted nipple, no mass, no nipple discharge and no tenderness (no axillary adenopathy). Left breast exhibits no inverted nipple, no mass, no nipple discharge and no tenderness (no axilarry adenopathy).  Abdominal: Soft. Bowel sounds are normal. There is no tenderness.  Musculoskeletal: She exhibits no edema or  tenderness.  Lymphadenopathy:    She has no cervical adenopathy.  Neurological: She is alert and oriented to person, place, and time.  Skin: Skin is warm. No rash noted. No erythema.  Psychiatric: She has a normal mood and affect. Her behavior is normal.    BP 130/68 (BP Location: Right Arm, Patient Position: Sitting, Cuff Size: Large)   Pulse 77   Temp 98.5 F (36.9 C) (Oral)   Resp 16   Ht '5\' 7"'  (1.702 m)   Wt 236 lb 9.6 oz (107.3 kg)   SpO2 98%   BMI 37.06 kg/m  Wt Readings from Last 3 Encounters:  05/02/16 236 lb 9.6 oz (107.3 kg)  02/11/16 237 lb 1.9 oz (107.6 kg)  12/24/15 236 lb 6.4 oz (107.2 kg)     Lab Results  Component Value Date   WBC 6.9 01/21/2016   HGB 14.0 01/21/2016   HCT 41.4 01/21/2016   PLT 220.0 01/21/2016   GLUCOSE 101 (H) 01/21/2016   CHOL 142 01/21/2016   TRIG 95.0 01/21/2016   HDL 59.40 01/21/2016   LDLCALC 64 01/21/2016   ALT 25 01/21/2016   AST 27 01/21/2016   NA 137 01/21/2016   K 4.4 01/21/2016   CL 102 01/21/2016   CREATININE 0.86 01/21/2016   BUN 15 01/21/2016   CO2 27 01/21/2016   TSH 2.55 01/21/2016   INR 2.3 (H) 04/24/2016   HGBA1C 5.7 01/21/2016   MICROALBUR <0.7 01/21/2016    Dex[place]an  Result Date: 04/19/2016 EXAM: DUAL X-RAY ABSORPTIOMETRY (DXA) FOR BONE MINERAL DENSITY IMPRESSION: Dear Dr. Nicki Reaper, Your patient Anita Walker completed a BMD test on 04/19/2016 using the [place] (analysis version: 14.10) manufactured by [employer]. The following summarizes the results of our evaluation. PATIENT BIOGRAPHICAL: Name: Walker, Anita Ann R Patient ID: [id number] Birth Date: November 22, 1940 Height: 66.0 in. Gender: Female Exam Date: 04/19/2016 Weight: 236.0 lbs. Indications: Breast CA, Caucasian, Height Loss, Postmenopausal Fractures: Treatments: Multi-Vitamin with calcium ASSESSMENT: The BMD measured at Femur Neck Right is 0.885 g/cm2 with a T-score of -1.1. This patient is considered osteopenic according to [facility]) criteria. Lumbar spine was not utilized due to advanced degenerative change . Site Region Measured Measured WHO Young Adult BMD Date       Age      Classification T-score DualFemur Neck Right 2/19/[age] 78.0 Osteopenia -1.1 0.885 g/cm2 Left Forearm Radius 33% 04/19/2016 78.0 Normal 0.1 0.883 g/cm2 World Health Organization [facility]) criteria for post-menopausal, Caucasian Women: Normal:       T-score at or above -1 SD Osteopenia:   T-score between -1 and -2.5 SD Osteoporosis: T-score at or below -2.5 SD RECOMMENDATIONS: [place] recommends that FDA-approved medical therapies be considered in postmenopausal women and men age 59 or older with a: 1. Hip or vertebral (clinical or morphometric) fracture. 2. T-score of < -2.5 at the spine or hip. 3. Ten-year  fracture probability by FRAX of 3% or greater for hip fracture or 20% or greater for major osteoporotic fracture. All treatment decisions require clinical judgment and consideration of individual patient factors, including patient preferences, co-morbidities, previous drug use, risk factors not captured in the FRAX model (e.g. falls, vitamin D deficiency, increased bone turnover, interval significant decline in bone density) and possible under - or over-estimation of fracture risk by FRAX. All patients should ensure an adequate intake of dietary calcium (1200 mg/d) and vitamin D (800 IU daily) unless contraindicated. FOLLOW-UP: People with diagnosed cases of osteoporosis or at high risk for fracture should have regular bone mineral density tests. For patients eligible for Medicare, routine testing is allowed once every 2 years. The testing frequency can be increased to one year for patients who have rapidly progressing disease, those who are receiving or discontinuing medical therapy to restore bone mass, or have additional risk factors. I have reviewed this report, and agree with the above findings. [facility]  Electronically Signed   By: Rolm Baptise M.D.   On: 04/19/2016 11:48       Assessment & Plan:   Problem List Items Addressed This Visit    Diabetes mellitus ([place])    Low carb diet and exercise.  Keep up to date with eye exam.  Follow met b and a1c.   Lab Results  Component Value Date   HGBA1C 5.7 01/21/2016        Health care maintenance    Physical today 05/02/16.  Mammogram 11/29/15 - Birads I.  Colonoscopy 02/2009 - diverticulosis and internal hemorrhoids.  Bone density with osteopenia.  Discussed calcium, vitamin D and weight bearing exercise.        History of breast cancer    Mammogram 11/29/15 - Birads I.       Hypercholesteremia    On simvastatin.  Low cholesterol diet and exercise.  Follow  Lipid panel and liver function tests.        Hypertension    Blood pressure under good control.  Continue same medication regimen.  Follow pressures.  Follow metabolic panel.            Einar Pheasant, MD

## 2016-05-08 DIAGNOSIS — Z85828 Personal history of other malignant neoplasm of skin: Secondary | ICD-10-CM | POA: Diagnosis not present

## 2016-05-08 DIAGNOSIS — Z86018 Personal history of other benign neoplasm: Secondary | ICD-10-CM | POA: Diagnosis not present

## 2016-05-14 NOTE — Assessment & Plan Note (Signed)
Mammogram 11/29/15 - Birads I.

## 2016-05-14 NOTE — Assessment & Plan Note (Signed)
Blood pressure under good control.  Continue same medication regimen.  Follow pressures.  Follow metabolic panel.   

## 2016-05-14 NOTE — Assessment & Plan Note (Signed)
Low carb diet and exercise.  Keep up to date with eye exam.  Follow met b and a1c.   Lab Results  Component Value Date   HGBA1C 5.7 01/21/2016

## 2016-05-14 NOTE — Assessment & Plan Note (Signed)
Physical today 05/02/16.  Mammogram 11/29/15 - Birads I.  Colonoscopy 02/2009 - diverticulosis and internal hemorrhoids.  Bone density with osteopenia.  Discussed calcium, vitamin D and weight bearing exercise.

## 2016-05-14 NOTE — Assessment & Plan Note (Signed)
On simvastatin.  Low cholesterol diet and exercise.  Follow  Lipid panel and liver function tests.   

## 2016-05-23 ENCOUNTER — Other Ambulatory Visit (INDEPENDENT_AMBULATORY_CARE_PROVIDER_SITE_OTHER): Payer: Medicare Other

## 2016-05-23 DIAGNOSIS — Z7901 Long term (current) use of anticoagulants: Secondary | ICD-10-CM | POA: Diagnosis not present

## 2016-05-23 LAB — PROTIME-INR
INR: 1.9 ratio — ABNORMAL HIGH (ref 0.8–1.0)
PROTHROMBIN TIME: 20.3 s — AB (ref 9.6–13.1)

## 2016-06-01 ENCOUNTER — Other Ambulatory Visit (INDEPENDENT_AMBULATORY_CARE_PROVIDER_SITE_OTHER): Payer: Medicare Other

## 2016-06-01 DIAGNOSIS — Z7901 Long term (current) use of anticoagulants: Secondary | ICD-10-CM

## 2016-06-01 LAB — PROTIME-INR
INR: 1.9 ratio — AB (ref 0.8–1.0)
Prothrombin Time: 20.8 s — ABNORMAL HIGH (ref 9.6–13.1)

## 2016-06-05 ENCOUNTER — Other Ambulatory Visit: Payer: Self-pay

## 2016-06-05 MED ORDER — FERROUS SULFATE 325 (65 FE) MG PO TABS: ORAL_TABLET | ORAL | 3 refills | Status: DC

## 2016-06-05 NOTE — Progress Notes (Unsigned)
Patient given lab information and asked for refill on Iron have called in lab app has been made.

## 2016-06-16 ENCOUNTER — Ambulatory Visit: Payer: Medicare Other | Admitting: Podiatry

## 2016-06-19 ENCOUNTER — Ambulatory Visit (INDEPENDENT_AMBULATORY_CARE_PROVIDER_SITE_OTHER): Payer: Medicare Other | Admitting: Podiatry

## 2016-06-19 DIAGNOSIS — M79609 Pain in unspecified limb: Secondary | ICD-10-CM | POA: Diagnosis not present

## 2016-06-19 DIAGNOSIS — B351 Tinea unguium: Secondary | ICD-10-CM | POA: Diagnosis not present

## 2016-06-19 NOTE — Progress Notes (Addendum)
Complaint:  Visit Type: Patient returns to my office for continued preventative foot care services. Complaint: Patient states" my nails have grown long and thick and become painful to walk and wear shoes" Patient has been diagnosed with DM with neuropathy. The patient presents for preventative foot care services. No changes to ROS  Podiatric Exam: Vascular: dorsalis pedis  are palpable bilateral. Posterior tibial pulses are non palpable due to ankle/leg swelling. Capillary return is immediate. Temperature gradient is WNL. Skin turgor WNL  Sensorium: Diminished  Semmes Weinstein monofilament test. Normal tactile sensation bilaterally. Nail Exam: Pt has thick disfigured discolored nails with subungual debris noted bilateral entire nail hallux through fifth toenails Ulcer Exam: There is no evidence of ulcer or pre-ulcerative changes or infection. Orthopedic Exam: Muscle tone and strength are WNL. No limitations in general ROM. No crepitus or effusions noted. Foot type and digits show no abnormalities. Bony prominences are unremarkable. Skin: No Porokeratosis. No infection or ulcers  Diagnosis:  Onychomycosis, , Pain in right toe, pain in left toes  Treatment & Plan Procedures and Treatment: Consent by patient was obtained for treatment procedures. The patient understood the discussion of treatment and procedures well. All questions were answered thoroughly reviewed. Debridement of mycotic and hypertrophic toenails, 1 through 5 bilateral and clearing of subungual debris. No ulceration, no infection noted.  Return Visit-Office Procedure: Patient instructed to return to the office for a follow up visit 3 months for continued evaluation and treatment.    Gardiner Barefoot DPM

## 2016-06-20 ENCOUNTER — Other Ambulatory Visit (INDEPENDENT_AMBULATORY_CARE_PROVIDER_SITE_OTHER): Payer: Medicare Other

## 2016-06-20 DIAGNOSIS — Z7901 Long term (current) use of anticoagulants: Secondary | ICD-10-CM | POA: Diagnosis not present

## 2016-06-20 LAB — PROTIME-INR
INR: 1.9 ratio — AB (ref 0.8–1.0)
PROTHROMBIN TIME: 20 s — AB (ref 9.6–13.1)

## 2016-06-21 ENCOUNTER — Telehealth: Payer: Self-pay

## 2016-06-21 NOTE — Telephone Encounter (Signed)
Patient advised no changes to current regimen per Dr Derrel Nip .  I wasn't sure how to document on lab results.   Also when do you want patient to return  INR.  Please advise.

## 2016-06-21 NOTE — Telephone Encounter (Signed)
Left voice mail to call back 

## 2016-06-21 NOTE — Telephone Encounter (Signed)
Dr Derrel Nip reviewed INR from 06/20/16 , per Dr Derrel Nip she didn't want to make any changes to current regimen.  Continue 5 mg  On Tuesday and Saturday 2.5 mg all other days.

## 2016-06-21 NOTE — Telephone Encounter (Signed)
PT called back returning your call. Thank you!

## 2016-06-22 NOTE — Telephone Encounter (Signed)
Spoke with the patient and reviewed changes. Scheduled for lab appt on the 2nd of may for I?NR recheck. thanks

## 2016-06-22 NOTE — Telephone Encounter (Signed)
Pt currently on 5mg  tues and Saturday and 2.5mg  all other days.  Have her change her coumadin to 5mg  on Tuesday, Thursday and Saturday and 2.5mg  all other days.  Recheck pt/inr in 10-14 days.

## 2016-07-05 ENCOUNTER — Other Ambulatory Visit (INDEPENDENT_AMBULATORY_CARE_PROVIDER_SITE_OTHER): Payer: Medicare Other

## 2016-07-05 DIAGNOSIS — Z7901 Long term (current) use of anticoagulants: Secondary | ICD-10-CM

## 2016-07-05 LAB — PROTIME-INR
INR: 1.7 ratio — ABNORMAL HIGH (ref 0.8–1.0)
Prothrombin Time: 18.6 s — ABNORMAL HIGH (ref 9.6–13.1)

## 2016-07-06 ENCOUNTER — Telehealth: Payer: Self-pay

## 2016-07-06 NOTE — Telephone Encounter (Signed)
Patient states she is taking 5mg  on Tuesday, Thursday and Saturday. And 2.5mg  all other days. Patient states the only other change is the time she is taking later in the day by little over hour.

## 2016-07-06 NOTE — Telephone Encounter (Signed)
Increase to 5mg  on Tuesday, Thursday, Saturday and Sunday and 2.5mg  all other days.  Recheck pt/inr in 10 days.

## 2016-07-06 NOTE — Telephone Encounter (Signed)
-----   Message from Einar Pheasant, MD sent at 07/06/2016  5:24 AM EDT ----- Need to confirm how she is taking her coumadin.  Gave directions to increase dose on the last lab check and INR decreased.  Need to know how taking so can adjust medication.

## 2016-07-06 NOTE — Telephone Encounter (Signed)
Left message to return call to our office.  

## 2016-07-06 NOTE — Telephone Encounter (Signed)
Patient informed app made

## 2016-07-17 ENCOUNTER — Other Ambulatory Visit (INDEPENDENT_AMBULATORY_CARE_PROVIDER_SITE_OTHER): Payer: Medicare Other

## 2016-07-17 DIAGNOSIS — Z7901 Long term (current) use of anticoagulants: Secondary | ICD-10-CM | POA: Diagnosis not present

## 2016-07-17 LAB — PROTIME-INR
INR: 2.8 ratio — ABNORMAL HIGH (ref 0.8–1.0)
Prothrombin Time: 29.5 s — ABNORMAL HIGH (ref 9.6–13.1)

## 2016-07-18 ENCOUNTER — Telehealth: Payer: Self-pay | Admitting: *Deleted

## 2016-07-18 NOTE — Telephone Encounter (Signed)
See results patient has been addressed

## 2016-07-18 NOTE — Telephone Encounter (Signed)
Patient requested lab results  Pt contact [phone]

## 2016-07-20 ENCOUNTER — Other Ambulatory Visit (INDEPENDENT_AMBULATORY_CARE_PROVIDER_SITE_OTHER): Payer: Medicare Other

## 2016-07-20 DIAGNOSIS — Z7901 Long term (current) use of anticoagulants: Secondary | ICD-10-CM

## 2016-07-20 LAB — PROTIME-INR
INR: 2.9 ratio — ABNORMAL HIGH (ref 0.8–1.0)
PROTHROMBIN TIME: 30.5 s — AB (ref 9.6–13.1)

## 2016-07-21 ENCOUNTER — Other Ambulatory Visit: Payer: Self-pay

## 2016-07-21 ENCOUNTER — Telehealth: Payer: Self-pay | Admitting: Internal Medicine

## 2016-07-21 MED ORDER — SIMVASTATIN 10 MG PO TABS: ORAL_TABLET | ORAL | 0 refills | Status: DC

## 2016-07-21 NOTE — Telephone Encounter (Signed)
Pt called back returning your call. Please advise, thank you!  Call pt @ [phone]

## 2016-07-21 NOTE — Telephone Encounter (Signed)
See lab message 

## 2016-07-24 ENCOUNTER — Other Ambulatory Visit: Payer: Self-pay | Admitting: Internal Medicine

## 2016-07-27 ENCOUNTER — Other Ambulatory Visit (INDEPENDENT_AMBULATORY_CARE_PROVIDER_SITE_OTHER): Payer: Medicare Other

## 2016-07-27 DIAGNOSIS — Z7901 Long term (current) use of anticoagulants: Secondary | ICD-10-CM

## 2016-07-27 LAB — PROTIME-INR
INR: 2.5 ratio — AB (ref 0.8–1.0)
PROTHROMBIN TIME: 27 s — AB (ref 9.6–13.1)

## 2016-07-28 NOTE — Telephone Encounter (Signed)
See lab results.  

## 2016-07-28 NOTE — Telephone Encounter (Signed)
Patient requested a call in refrence to labs  Pt contact [phone]

## 2016-08-10 ENCOUNTER — Other Ambulatory Visit (INDEPENDENT_AMBULATORY_CARE_PROVIDER_SITE_OTHER): Payer: Medicare Other

## 2016-08-10 DIAGNOSIS — Z7901 Long term (current) use of anticoagulants: Secondary | ICD-10-CM | POA: Diagnosis not present

## 2016-08-10 LAB — PROTIME-INR
INR: 2.8 ratio — AB (ref 0.8–1.0)
PROTHROMBIN TIME: 30.4 s — AB (ref 9.6–13.1)

## 2016-08-11 ENCOUNTER — Telehealth: Payer: Self-pay | Admitting: *Deleted

## 2016-08-11 NOTE — Telephone Encounter (Signed)
Pt requested lab results  Pt contact [phone]

## 2016-08-11 NOTE — Telephone Encounter (Signed)
See lab note.  

## 2016-09-08 ENCOUNTER — Ambulatory Visit (INDEPENDENT_AMBULATORY_CARE_PROVIDER_SITE_OTHER): Payer: Medicare Other | Admitting: Internal Medicine

## 2016-09-08 ENCOUNTER — Encounter: Payer: Self-pay | Admitting: Internal Medicine

## 2016-09-08 VITALS — BP 122/64 | HR 71 | Temp 98.6°F | Resp 14 | Ht 67.0 in | Wt 239.8 lb

## 2016-09-08 DIAGNOSIS — E119 Type 2 diabetes mellitus without complications: Secondary | ICD-10-CM | POA: Diagnosis not present

## 2016-09-08 DIAGNOSIS — Z7901 Long term (current) use of anticoagulants: Secondary | ICD-10-CM

## 2016-09-08 DIAGNOSIS — E7800 Pure hypercholesterolemia, unspecified: Secondary | ICD-10-CM | POA: Diagnosis not present

## 2016-09-08 DIAGNOSIS — I10 Essential (primary) hypertension: Secondary | ICD-10-CM | POA: Diagnosis not present

## 2016-09-08 DIAGNOSIS — Z853 Personal history of malignant neoplasm of breast: Secondary | ICD-10-CM

## 2016-09-08 LAB — HEPATIC FUNCTION PANEL
ALT: 16 U/L (ref 0–35)
AST: 20 U/L (ref 0–37)
Albumin: 4 g/dL (ref 3.5–5.2)
Alkaline Phosphatase: 37 U/L — ABNORMAL LOW (ref 39–117)
BILIRUBIN DIRECT: 0.1 mg/dL (ref 0.0–0.3)
TOTAL PROTEIN: 6.9 g/dL (ref 6.0–8.3)
Total Bilirubin: 0.5 mg/dL (ref 0.2–1.2)

## 2016-09-08 LAB — PROTIME-INR
INR: 2.8 ratio — ABNORMAL HIGH (ref 0.8–1.0)
Prothrombin Time: 29.7 s — ABNORMAL HIGH (ref 9.6–13.1)

## 2016-09-08 LAB — BASIC METABOLIC PANEL
BUN: 18 mg/dL (ref 6–23)
CO2: 31 mEq/L (ref 19–32)
Calcium: 9.7 mg/dL (ref 8.4–10.5)
Chloride: 103 mEq/L (ref 96–112)
Creatinine, Ser: 0.96 mg/dL (ref 0.40–1.20)
GFR: 59.67 mL/min — AB (ref >60.00)
GLUCOSE: 104 mg/dL — AB (ref 70–99)
POTASSIUM: 4.5 meq/L (ref 3.5–5.1)
SODIUM: 139 meq/L (ref 135–145)

## 2016-09-08 LAB — HEMOGLOBIN A1C: Hgb A1c MFr Bld: 5.8 % (ref 4.6–6.5)

## 2016-09-08 MED ORDER — PANTOPRAZOLE SODIUM 40 MG PO TBEC: 40.0000 mg | DELAYED_RELEASE_TABLET | Freq: Every day | ORAL | 1 refills | Status: DC

## 2016-09-08 MED ORDER — SIMVASTATIN 10 MG PO TABS: ORAL_TABLET | ORAL | 1 refills | Status: DC

## 2016-09-08 MED ORDER — LOSARTAN POTASSIUM 50 MG PO TABS: ORAL_TABLET | ORAL | 1 refills | Status: DC

## 2016-09-08 NOTE — Progress Notes (Signed)
Pre-visit discussion using our clinic review tool. No additional management support is needed unless otherwise documented below in the visit note.  

## 2016-09-08 NOTE — Progress Notes (Signed)
Patient ID: Jo Ann Rowland Martinique, female   DOB: 1940/07/16, 76 y.o.   MRN: [id number]   Subjective:    Patient ID: Jo Ann Rowland Martinique, female    DOB: September 14, 1940, 76 y.o.   MRN: [id number]  HPI  Patient here for a scheduled follow up.  She reports she is doing well.  Still going to the flea market.  Enjoys this.  Gets her out of the house.  No chest pain. Tries to stay active.  No sob.  No acid reflux.  No abdominal pain.  Bowels moving.  Handling stress.     Past Medical History:  Diagnosis Date  . Anemia   . Atrial fibrillation ([place])   . Breast cancer ([place])    s/p lumpectomy 1992.  s/p chemo and xrt  . Diabetes mellitus ([place])   . Gastric ulcer   . GERD (gastroesophageal reflux disease)   . Hypercholesterolemia   . Hypertension   . Pulmonary emboli [facility])    Past Surgical History:  Procedure Laterality Date  . BREAST LUMPECTOMY  03/06/1990   left breast   Family History  Problem Relation Age of Onset  . Heart disease Father   . Heart disease Brother        s/p CABG  . Colon cancer Neg Hx    Social History   Social History  . Marital status: Married    Spouse name: N/A  . Number of children: N/A  . Years of education: N/A   Social History Main Topics  . Smoking status: Never Smoker  . Smokeless tobacco: Never Used  . Alcohol use No  . Drug use: No  . Sexual activity: Not Asked   Other Topics Concern  . None   Social History Narrative  . None    Outpatient Encounter Prescriptions as of 09/08/2016  Medication Sig  . ferrous sulfate 325 (65 FE) MG tablet TAKE ONE (1) TABLET BY MOUTH EVERY DAY  . losartan (COZAAR) 50 MG tablet TAKE ONE (1) TABLET EACH DAY  . pantoprazole (PROTONIX) 40 MG tablet Take 1 tablet (40 mg total) by mouth daily.  . simvastatin (ZOCOR) 10 MG tablet TAKE ONE (1) TABLET BY MOUTH EVERY DAY  . warfarin (COUMADIN) 2.5 MG tablet TAKE TWO (2) TABLETS BY MOUTH ON TUE, THURS, SAT, & SUN; TAKE ONE (1) BY MOUTH DAILY ALL OTHER DAYS  .  [DISCONTINUED] losartan (COZAAR) 50 MG tablet TAKE ONE (1) TABLET EACH DAY  . [DISCONTINUED] pantoprazole (PROTONIX) 40 MG tablet TAKE ONE TABLET BY MOUTH EVERY DAY  . [DISCONTINUED] simvastatin (ZOCOR) 10 MG tablet TAKE ONE (1) TABLET BY MOUTH EVERY DAY   No facility-administered encounter medications on file as of 09/08/2016.     Review of Systems  Constitutional: Negative for appetite change and unexpected weight change.  HENT: Negative for congestion and sinus pressure.   Respiratory: Negative for cough, chest tightness and shortness of breath.   Cardiovascular: Negative for chest pain, palpitations and leg swelling.  Gastrointestinal: Negative for abdominal pain, diarrhea, nausea and vomiting.  Genitourinary: Negative for difficulty urinating and dysuria.  Musculoskeletal: Negative for back pain and joint swelling.  Skin: Negative for color change and rash.  Neurological: Negative for dizziness, light-headedness and headaches.  Psychiatric/Behavioral: Negative for agitation and dysphoric mood.       Objective:    Physical Exam  Constitutional: She appears well-developed and well-nourished. No distress.  HENT:  Nose: Nose normal.  Mouth/Throat: Oropharynx is clear and moist.  Neck: Neck supple.  No thyromegaly present.  Cardiovascular: Normal rate and regular rhythm.   Pulmonary/Chest: Breath sounds normal. No respiratory distress. She has no wheezes.  Abdominal: Soft. Bowel sounds are normal. There is no tenderness.  Musculoskeletal: She exhibits no edema or tenderness.  Lymphadenopathy:    She has no cervical adenopathy.  Skin: No rash noted. No erythema.  Psychiatric: She has a normal mood and affect. Her behavior is normal.    BP 122/64 (BP Location: Left Arm, Patient Position: Sitting, Cuff Size: Large)   Pulse 71   Temp 98.6 F (37 C) (Oral)   Resp 14   Ht _0  (1.702 m)   Wt 239 lb 12.8 oz (108.8 kg)   SpO2 96%   BMI 37.56 kg/m  Wt Readings from Last 3  Encounters:  09/08/16 239 lb 12.8 oz (108.8 kg)  05/02/16 236 lb 9.6 oz (107.3 kg)  02/11/16 237 lb 1.9 oz (107.6 kg)     Lab Results  Component Value Date   WBC 6.9 01/21/2016   HGB 14.0 01/21/2016   HCT 41.4 01/21/2016   PLT 220.0 01/21/2016   GLUCOSE 104 (H) 09/08/2016   CHOL 142 01/21/2016   TRIG 95.0 01/21/2016   HDL 59.40 01/21/2016   LDLCALC 64 01/21/2016   ALT 16 09/08/2016   AST 20 09/08/2016   NA 139 09/08/2016   K 4.5 09/08/2016   CL 103 09/08/2016   CREATININE 0.96 09/08/2016   BUN 18 09/08/2016   CO2 31 09/08/2016   TSH 2.55 01/21/2016   INR 2.8 (H) 09/08/2016   HGBA1C 5.8 09/08/2016   MICROALBUR <0.7 01/21/2016    Dexascan  Result Date: 04/19/2016 EXAM: DUAL X-RAY ABSORPTIOMETRY (DXA) FOR BONE MINERAL DENSITY IMPRESSION: Dear Dr. Nicki Reaper, Your patient Aala Martinique completed a BMD test on 04/19/2016 using the [place] (analysis version: 14.10) manufactured by [employer]. The following summarizes the results of our evaluation. PATIENT BIOGRAPHICAL: Name: Martinique, Milton R Patient ID: [id number] Birth Date: 30-Sep-1940 Height: 66.0 in. Gender: Female Exam Date: 04/19/2016 Weight: 236.0 lbs. Indications: Breast CA, Caucasian, Height Loss, Postmenopausal Fractures: Treatments: Multi-Vitamin with calcium ASSESSMENT: The BMD measured at Femur Neck Right is 0.885 g/cm2 with a T-score of -1.1. This patient is considered osteopenic according to [facility]) criteria. Lumbar spine was not utilized due to advanced degenerative change . Site Region Measured Measured WHO Young Adult BMD Date       Age      Classification T-score DualFemur Neck Right 2/19/[age] 78.0 Osteopenia -1.1 0.885 g/cm2 Left Forearm Radius 33% 04/19/2016 78.0 Normal 0.1 0.883 g/cm2 World Health Organization [facility]) criteria for post-menopausal, Caucasian Women: Normal:       T-score at or above -1 SD Osteopenia:   T-score between -1 and -2.5 SD Osteoporosis: T-score at or below  -2.5 SD RECOMMENDATIONS: [place] recommends that FDA-approved medical therapies be considered in postmenopausal women and men age 69 or older with a: 1. Hip or vertebral (clinical or morphometric) fracture. 2. T-score of < -2.5 at the spine or hip. 3. Ten-year fracture probability by FRAX of 3% or greater for hip fracture or 20% or greater for major osteoporotic fracture. All treatment decisions require clinical judgment and consideration of individual patient factors, including patient preferences, co-morbidities, previous drug use, risk factors not captured in the FRAX model (e.g. falls, vitamin D deficiency, increased bone turnover, interval significant decline in bone density) and possible under - or over-estimation of fracture risk by FRAX. All  patients should ensure an adequate intake of dietary calcium (1200 mg/d) and vitamin D (800 IU daily) unless contraindicated. FOLLOW-UP: People with diagnosed cases of osteoporosis or at high risk for fracture should have regular bone mineral density tests. For patients eligible for Medicare, routine testing is allowed once every 2 years. The testing frequency can be increased to one year for patients who have rapidly progressing disease, those who are receiving or discontinuing medical therapy to restore bone mass, or have additional risk factors. I have reviewed this report, and agree with the above findings. [facility] Electronically Signed   By: Rolm Baptise M.D.   On: 04/19/2016 11:48       Assessment & Plan:   Problem List Items Addressed This Visit    Chronic anticoagulation   Relevant Orders   Protime-INR (Completed)   Diabetes mellitus ([place])    Low carb diet and exercise.  Follow met b and a1c.  Up to date with eye checks.        Relevant Medications   simvastatin (ZOCOR) 10 MG tablet   losartan (COZAAR) 50 MG tablet   Other Relevant Orders   Hemoglobin A1c (Completed)   Varicella zoster antibody, IgG    History of breast cancer    Mammogram 11/29/15 - Birads I.        Hypercholesteremia - Primary    On simvastatin.  Low cholesterol diet and exercise.  Follow lipid panel and liver function tests.        Relevant Medications   simvastatin (ZOCOR) 10 MG tablet   losartan (COZAAR) 50 MG tablet   Other Relevant Orders   Hepatic function panel (Completed)   Hypertension    Blood pressure under good control.  Continue same medication regimen.  Follow pressures.  Follow metabolic panel.        Relevant Medications   simvastatin (ZOCOR) 10 MG tablet   losartan (COZAAR) 50 MG tablet   Other Relevant Orders   Basic metabolic panel (Completed)       Einar Pheasant, MD

## 2016-09-10 ENCOUNTER — Encounter: Payer: Self-pay | Admitting: Internal Medicine

## 2016-09-10 ENCOUNTER — Other Ambulatory Visit: Payer: Self-pay | Admitting: Internal Medicine

## 2016-09-10 DIAGNOSIS — Z7901 Long term (current) use of anticoagulants: Secondary | ICD-10-CM

## 2016-09-10 DIAGNOSIS — E7800 Pure hypercholesterolemia, unspecified: Secondary | ICD-10-CM

## 2016-09-10 NOTE — Assessment & Plan Note (Signed)
Blood pressure under good control.  Continue same medication regimen.  Follow pressures.  Follow metabolic panel.   

## 2016-09-10 NOTE — Assessment & Plan Note (Signed)
Low carb diet and exercise.  Follow met b and a1c.  Up to date with eye checks.

## 2016-09-10 NOTE — Assessment & Plan Note (Signed)
On simvastatin.  Low cholesterol diet and exercise.  Follow lipid panel and liver function tests.   

## 2016-09-10 NOTE — Assessment & Plan Note (Signed)
Mammogram 11/29/15 - Birads I.

## 2016-09-10 NOTE — Progress Notes (Signed)
Order placed for f/u labs.  

## 2016-09-11 ENCOUNTER — Telehealth: Payer: Self-pay | Admitting: *Deleted

## 2016-09-11 NOTE — Telephone Encounter (Signed)
See lab note.  

## 2016-09-11 NOTE — Telephone Encounter (Signed)
Pt requested lab results Pt contact [phone]

## 2016-09-12 ENCOUNTER — Encounter: Payer: Self-pay | Admitting: Family Medicine

## 2016-09-12 ENCOUNTER — Ambulatory Visit (INDEPENDENT_AMBULATORY_CARE_PROVIDER_SITE_OTHER): Payer: Medicare Other | Admitting: Family Medicine

## 2016-09-12 ENCOUNTER — Ambulatory Visit (INDEPENDENT_AMBULATORY_CARE_PROVIDER_SITE_OTHER)
Admission: RE | Admit: 2016-09-12 | Discharge: 2016-09-12 | Disposition: A | Discharge status: Home / Self Care | Payer: Medicare Other | Source: Ambulatory Visit | Attending: Family Medicine | Admitting: Family Medicine

## 2016-09-12 ENCOUNTER — Other Ambulatory Visit: Payer: Self-pay | Admitting: Family Medicine

## 2016-09-12 VITALS — BP 150/60 | HR 83 | Temp 98.7°F | Resp 16 | Ht 67.0 in | Wt 239.8 lb

## 2016-09-12 DIAGNOSIS — R05 Cough: Secondary | ICD-10-CM | POA: Diagnosis not present

## 2016-09-12 DIAGNOSIS — J988 Other specified respiratory disorders: Secondary | ICD-10-CM

## 2016-09-12 MED ORDER — BENZONATATE 100 MG PO CAPS: 100.0000 mg | ORAL_CAPSULE | Freq: Three times a day (TID) | ORAL | 0 refills | Status: DC | PRN

## 2016-09-12 MED ORDER — LEVOFLOXACIN 500 MG PO TABS: 500.0000 mg | ORAL_TABLET | Freq: Every day | ORAL | 0 refills | Status: DC

## 2016-09-12 NOTE — Assessment & Plan Note (Signed)
New acute problem. I suspect this is viral in etiology. However, given her advanced age and productive cough I'm arranging a chest x-ray. Tessalon for cough.

## 2016-09-12 NOTE — Patient Instructions (Signed)
Cough medication as needed.  Call if you fail to improve or worsen.  Take care  Dr. Lacinda Axon

## 2016-09-12 NOTE — Progress Notes (Signed)
Subjective:  [name], female    DOB: December 15, 1940  Age: 76 y.o. MRN: [id number]  CC: Cough, Chills, PND  HPI:  76 year old female presents with the above complaints.  She states that she's been sick for 2 days. Symptoms are mild to moderate in severity. She's had productive cough and congestion. Cough is productive of light green sputum. She states that she is having some post nasal drip as well. She reports recent chills but no fever. No medications or interventions tried. No known exacerbating factors. No other associated symptoms. No other complaints at this time.  Social Hx   Social History   Social History  . Marital status: Married    Spouse name: N/A  . Number of children: N/A  . Years of education: N/A   Social History Main Topics  . Smoking status: Never Smoker  . Smokeless tobacco: Never Used  . Alcohol use No  . Drug use: No  . Sexual activity: Not Asked   Other Topics Concern  . None   Social History Narrative  . None    Review of Systems  Constitutional: Positive for chills. Negative for fever.  HENT: Positive for postnasal drip.   Respiratory: Positive for cough.    Objective:  BP (!) 150/60   Pulse 83   Temp 98.7 F (37.1 C) (Oral)   Resp 16   Ht 5\' 7"  (1.702 m)   Wt 239 lb 12.8 oz (108.8 kg)   SpO2 98%   BMI 37.56 kg/m   BP/Weight 09/12/2016 09/08/2016 7/85/8850  Systolic BP 277 412 878  Diastolic BP 60 64 68  Wt. (Lbs) 239.8 239.8 236.6  BMI 37.56 37.56 37.06    Physical Exam  Constitutional: She is oriented to person, place, and time. She appears well-developed. No distress.  HENT:  Oropharynx with mild to moderate erythema. No exudate.  Cardiovascular: Normal rate and regular rhythm.   2/6 systolic murmur.  Pulmonary/Chest: Effort normal. She has no wheezes. She has no rales.  Lymphadenopathy:    She has no cervical adenopathy.  Neurological: She is alert and oriented to person, place, and time.    Psychiatric: She has a normal mood and affect.  Vitals reviewed.  Lab Results  Component Value Date   WBC 6.9 01/21/2016   HGB 14.0 01/21/2016   HCT 41.4 01/21/2016   PLT 220.0 01/21/2016   GLUCOSE 104 (H) 09/08/2016   CHOL 142 01/21/2016   TRIG 95.0 01/21/2016   HDL 59.40 01/21/2016   LDLCALC 64 01/21/2016   ALT 16 09/08/2016   AST 20 09/08/2016   NA 139 09/08/2016   K 4.5 09/08/2016   CL 103 09/08/2016   CREATININE 0.96 09/08/2016   BUN 18 09/08/2016   CO2 31 09/08/2016   TSH 2.55 01/21/2016   INR 2.8 (H) 09/08/2016   HGBA1C 5.8 09/08/2016   MICROALBUR <0.7 01/21/2016    Assessment & Plan:   Problem List Items Addressed This Visit      Respiratory   Respiratory infection - Primary    New acute problem. I suspect this is viral in etiology. However, given her advanced age and productive cough I'm arranging a chest x-ray. Tessalon for cough.       Relevant Medications   benzonatate (TESSALON) 100 MG capsule   Other Relevant Orders   DG Chest 2 View      Meds ordered this encounter  Medications  . benzonatate (TESSALON) 100 MG capsule  Sig: Take 1 capsule (100 mg total) by mouth 3 (three) times daily as needed.    Dispense:  30 capsule    Refill:  0   Follow-up: PRN  [place]

## 2016-09-15 ENCOUNTER — Other Ambulatory Visit (INDEPENDENT_AMBULATORY_CARE_PROVIDER_SITE_OTHER): Payer: Medicare Other

## 2016-09-15 DIAGNOSIS — Z7901 Long term (current) use of anticoagulants: Secondary | ICD-10-CM | POA: Diagnosis not present

## 2016-09-15 DIAGNOSIS — E7800 Pure hypercholesterolemia, unspecified: Secondary | ICD-10-CM

## 2016-09-15 LAB — LIPID PANEL
CHOL/HDL RATIO: 3
CHOLESTEROL: 131 mg/dL (ref 0–200)
HDL: 49 mg/dL (ref >39.00)
LDL Cholesterol: 61 mg/dL (ref 0–99)
NonHDL: 81.84
TRIGLYCERIDES: 104 mg/dL (ref 0.0–149.0)
VLDL: 20.8 mg/dL (ref 0.0–40.0)

## 2016-09-15 LAB — PROTIME-INR
INR: 2.9 ratio — AB (ref 0.8–1.0)
Prothrombin Time: 30.6 s — ABNORMAL HIGH (ref 9.6–13.1)

## 2016-09-18 ENCOUNTER — Encounter: Payer: Self-pay | Admitting: Internal Medicine

## 2016-09-18 ENCOUNTER — Telehealth: Payer: Self-pay | Admitting: *Deleted

## 2016-09-18 ENCOUNTER — Encounter: Payer: Self-pay | Admitting: Podiatry

## 2016-09-18 ENCOUNTER — Ambulatory Visit (INDEPENDENT_AMBULATORY_CARE_PROVIDER_SITE_OTHER): Payer: Medicare Other | Admitting: Podiatry

## 2016-09-18 DIAGNOSIS — M79609 Pain in unspecified limb: Secondary | ICD-10-CM

## 2016-09-18 DIAGNOSIS — L600 Ingrowing nail: Secondary | ICD-10-CM

## 2016-09-18 DIAGNOSIS — B351 Tinea unguium: Secondary | ICD-10-CM | POA: Diagnosis not present

## 2016-09-18 DIAGNOSIS — E1142 Type 2 diabetes mellitus with diabetic polyneuropathy: Secondary | ICD-10-CM

## 2016-09-18 LAB — HM DIABETES FOOT EXAM

## 2016-09-18 NOTE — Telephone Encounter (Signed)
Patient is requesting lab results  Pt contact [phone]

## 2016-09-18 NOTE — Telephone Encounter (Signed)
Pt requested lab results after 3pm Pt contact [phone]

## 2016-09-18 NOTE — Progress Notes (Signed)
Complaint:  Visit Type: Patient returns to my office for continued preventative foot care services. Complaint: Patient states" my nails have grown long and thick and become painful to walk and wear shoes" Patient has been diagnosed with DM with neuropathy. The patient presents for preventative foot care services. No changes to ROS  Podiatric Exam: Vascular: dorsalis pedis  are palpable bilateral. Posterior tibial pulses are non palpable due to ankle/leg swelling. Capillary return is immediate. Temperature gradient is WNL. Skin turgor WNL  Sensorium: Diminished  Semmes Weinstein monofilament test. Normal tactile sensation bilaterally. Nail Exam: Pt has thick disfigured discolored nails with subungual debris noted bilateral entire nail hallux through fifth toenails Ulcer Exam: There is no evidence of ulcer or pre-ulcerative changes or infection. Orthopedic Exam: Muscle tone and strength are WNL. No limitations in general ROM. No crepitus or effusions noted. Foot type and digits show no abnormalities. Bony prominences are unremarkable. Skin: No Porokeratosis. No infection or ulcers  Diagnosis:  Onychomycosis, , Pain in right toe, pain in left toes  Treatment & Plan Procedures and Treatment: Consent by patient was obtained for treatment procedures. The patient understood the discussion of treatment and procedures well. All questions were answered thoroughly reviewed. Debridement of mycotic and hypertrophic toenails, 1 through 5 bilateral and clearing of subungual debris. No ulceration, no infection noted.  Return Visit-Office Procedure: Patient instructed to return to the office for a follow up visit 3 months for continued evaluation and treatment.    Gardiner Barefoot DPM

## 2016-09-19 NOTE — Telephone Encounter (Signed)
Pt cal back requesting labs. Please advise, thank you!

## 2016-09-19 NOTE — Telephone Encounter (Signed)
See lab results.  

## 2016-09-22 ENCOUNTER — Encounter: Payer: Self-pay | Admitting: Internal Medicine

## 2016-09-22 ENCOUNTER — Ambulatory Visit (INDEPENDENT_AMBULATORY_CARE_PROVIDER_SITE_OTHER): Payer: Medicare Other | Admitting: Internal Medicine

## 2016-09-22 DIAGNOSIS — I10 Essential (primary) hypertension: Secondary | ICD-10-CM | POA: Diagnosis not present

## 2016-09-22 DIAGNOSIS — J988 Other specified respiratory disorders: Secondary | ICD-10-CM | POA: Diagnosis not present

## 2016-09-22 MED ORDER — PREDNISONE 10 MG PO TABS: ORAL_TABLET | ORAL | 0 refills | Status: DC

## 2016-09-22 MED ORDER — BENZONATATE 100 MG PO CAPS: 100.0000 mg | ORAL_CAPSULE | Freq: Three times a day (TID) | ORAL | 0 refills | Status: DC | PRN

## 2016-09-22 NOTE — Progress Notes (Signed)
[name], female   DOB: 1940-09-30, 76 y.o.   MRN: [id number]   Subjective:    [name], female    DOB: 08/16/1940, 76 y.o.   MRN: [id number]  HPI  Patient here as a work in with concerns regarding a persistent cough.  She was seen by Dr Lacinda Axon on 09/12/16.  cxr with question of pneumonia. Placed on levaquin.  Took the abx.  Is better, but reports persistent cough.  Worse in the evening.  Still with some coughing fits.  Has been taking tessalon perles.  Does help some. Eating and drinking.  No nausea or vomiting.  No diarrhea.  No sob.  No chest pain or increased leg swelling.     Past Medical History:  Diagnosis Date  . Anemia   . Atrial fibrillation ([place])   . Breast cancer ([place])    s/p lumpectomy 1992.  s/p chemo and xrt  . Diabetes mellitus ([place])   . Gastric ulcer   . GERD (gastroesophageal reflux disease)   . Hypercholesterolemia   . Hypertension   . Pulmonary emboli [facility])    Past Surgical History:  Procedure Laterality Date  . BREAST LUMPECTOMY  03/06/1990   left breast   Family History  Problem Relation Age of Onset  . Heart disease Father   . Heart disease Brother        s/p CABG  . Colon cancer Neg Hx    Social History   Social History  . Marital status: Married    Spouse name: N/A  . Number of children: N/A  . Years of education: N/A   Social History Main Topics  . Smoking status: Never Smoker  . Smokeless tobacco: Never Used  . Alcohol use No  . Drug use: No  . Sexual activity: Not Asked   Other Topics Concern  . None   Social History Narrative  . None    Outpatient Encounter Prescriptions as of 09/22/2016  Medication Sig  . ferrous sulfate 325 (65 FE) MG tablet TAKE ONE (1) TABLET BY MOUTH EVERY DAY  . losartan (COZAAR) 50 MG tablet TAKE ONE (1) TABLET EACH DAY  . pantoprazole (PROTONIX) 40 MG tablet Take 1 tablet (40 mg total) by mouth daily.  . predniSONE (DELTASONE) 10 MG tablet Take 4 tablets x 1  day and then decrease by 1/2 tablet per day until down to zero mg.  . simvastatin (ZOCOR) 10 MG tablet TAKE ONE (1) TABLET BY MOUTH EVERY DAY  . warfarin (COUMADIN) 2.5 MG tablet TAKE TWO (2) TABLETS BY MOUTH ON TUE, THURS, SAT, & SUN; TAKE ONE (1) BY MOUTH DAILY ALL OTHER DAYS  . [DISCONTINUED] benzonatate (TESSALON) 100 MG capsule Take 1 capsule (100 mg total) by mouth 3 (three) times daily as needed. (Patient not taking: Reported on 09/22/2016)  . [DISCONTINUED] benzonatate (TESSALON) 100 MG capsule Take 1 capsule (100 mg total) by mouth 3 (three) times daily as needed. (Patient not taking: Reported on 09/22/2016)  . [DISCONTINUED] levofloxacin (LEVAQUIN) 500 MG tablet Take 1 tablet (500 mg total) by mouth daily. (Patient not taking: Reported on 09/22/2016)   No facility-administered encounter medications on file as of 09/22/2016.     Review of Systems  Constitutional: Negative for appetite change and unexpected weight change.  HENT: Negative for sinus pressure and sore throat.   Respiratory: Positive for cough. Negative for chest tightness and shortness of breath.        Occasionally productive  cough or clear sputum.  Some coughing fits.    Cardiovascular: Negative for chest pain, palpitations and leg swelling.  Gastrointestinal: Negative for diarrhea, nausea and vomiting.  Genitourinary: Negative for difficulty urinating and dysuria.  Musculoskeletal: Negative for joint swelling and myalgias.  Skin: Negative for color change and rash.  Neurological: Negative for dizziness and headaches.  Psychiatric/Behavioral: Negative for agitation and dysphoric mood.       Objective:    Physical Exam  Constitutional: She appears well-developed and well-nourished. No distress.  HENT:  Nose: Nose normal.  Mouth/Throat: Oropharynx is clear and moist.  Neck: Neck supple.  Cardiovascular: Normal rate and regular rhythm.   Pulmonary/Chest: Breath sounds normal. No respiratory distress. She has no  wheezes.  Some cough with forced expiration.   Abdominal: Soft. Bowel sounds are normal. There is no tenderness.  Musculoskeletal: She exhibits no edema or tenderness.  Lymphadenopathy:    She has no cervical adenopathy.  Skin: No rash noted. No erythema.  Psychiatric: She has a normal mood and affect. Her behavior is normal.    BP 122/84 (BP Location: Right Arm, Patient Position: Sitting, Cuff Size: Large)   Pulse 73   Temp 98.4 F (36.9 C) (Oral)   Resp 16   Wt 238 lb 6 oz (108.1 kg)   SpO2 98%   BMI 37.33 kg/m  Wt Readings from Last 3 Encounters:  09/22/16 238 lb 6 oz (108.1 kg)  09/12/16 239 lb 12.8 oz (108.8 kg)  09/08/16 239 lb 12.8 oz (108.8 kg)     Lab Results  Component Value Date   WBC 6.9 01/21/2016   HGB 14.0 01/21/2016   HCT 41.4 01/21/2016   PLT 220.0 01/21/2016   GLUCOSE 104 (H) 09/08/2016   CHOL 131 09/15/2016   TRIG 104.0 09/15/2016   HDL 49.00 09/15/2016   LDLCALC 61 09/15/2016   ALT 16 09/08/2016   AST 20 09/08/2016   NA 139 09/08/2016   K 4.5 09/08/2016   CL 103 09/08/2016   CREATININE 0.96 09/08/2016   BUN 18 09/08/2016   CO2 31 09/08/2016   TSH 2.55 01/21/2016   INR 2.9 (H) 09/15/2016   HGBA1C 5.8 09/08/2016   MICROALBUR <0.7 01/21/2016    Dg Chest 2 View  Result Date: 09/12/2016 CLINICAL DATA:  77 year old diabetic hypertensive female with cough and chills for 2 days. Initial encounter. EXAM: CHEST  2 VIEW COMPARISON:  None. FINDINGS: On the frontal view increased markings at the expected level of right middle lobe raises possibly of subtle infiltrate however this is not confirmed on lateral view. There are no remote exams available to determine if this represents chronic changes. This can be assessed on follow-up. Moderate-size hiatal hernia. Minimal peribronchial thickening. No pulmonary edema or pneumothorax. Calcified aortic knob. No focal thoracic compression fracture. Postsurgical changes left breast/ axilla without plain film evidence  of pulmonary metastatic disease. IMPRESSION: On the frontal view increased markings at the expected level of right middle lobe raises possibly of subtle infiltrate however this is not confirmed on lateral view. There are no remote exams available to determine if this represents chronic changes. This can be assessed on follow-up. Moderate-size hiatal hernia. Electronically Signed   By: Genia Del M.D.   On: 09/12/2016 16:44       Assessment & Plan:   Problem List Items Addressed This Visit    Hypertension    Blood pressure under good control.  Continue same medication regimen.  Follow pressures.  Follow metabolic panel.  Respiratory infection    Recent cough and congestion.  Recent cxr with concern over pneumonia.  Treated with levaquin and tessalon perles.  Is some better.  Still with persistent cough as outlined.  Treat with prednisone taper as directed.  Hold on more abx.  Refilled tessalon perles.  Will need f/u cxr in 3-4 weeks.            Einar Pheasant, MD

## 2016-09-24 ENCOUNTER — Encounter: Payer: Self-pay | Admitting: Internal Medicine

## 2016-09-24 NOTE — Assessment & Plan Note (Signed)
Recent cough and congestion.  Recent cxr with concern over pneumonia.  Treated with levaquin and tessalon perles.  Is some better.  Still with persistent cough as outlined.  Treat with prednisone taper as directed.  Hold on more abx.  Refilled tessalon perles.  Will need f/u cxr in 3-4 weeks.

## 2016-09-24 NOTE — Assessment & Plan Note (Signed)
Blood pressure under good control.  Continue same medication regimen.  Follow pressures.  Follow metabolic panel.   

## 2016-09-27 ENCOUNTER — Telehealth: Payer: Self-pay | Admitting: Internal Medicine

## 2016-09-27 NOTE — Telephone Encounter (Signed)
Lm on vm to schedule a 4wk follow up around 8/22/ac

## 2016-10-12 ENCOUNTER — Telehealth: Payer: Self-pay | Admitting: Radiology

## 2016-10-12 ENCOUNTER — Other Ambulatory Visit: Payer: Self-pay | Admitting: Radiology

## 2016-10-12 DIAGNOSIS — Z5181 Encounter for therapeutic drug level monitoring: Secondary | ICD-10-CM

## 2016-10-12 DIAGNOSIS — Z7901 Long term (current) use of anticoagulants: Principal | ICD-10-CM

## 2016-10-12 NOTE — Telephone Encounter (Signed)
No.  Just pt/inr is all I need.  Thanks

## 2016-10-12 NOTE — Telephone Encounter (Signed)
PT coming in for labs tomorrow, wanted to make sure we were just drawing INR. Did you want cholesterol checked as well?

## 2016-10-13 ENCOUNTER — Other Ambulatory Visit: Payer: Self-pay | Admitting: Internal Medicine

## 2016-10-13 ENCOUNTER — Ambulatory Visit (INDEPENDENT_AMBULATORY_CARE_PROVIDER_SITE_OTHER): Payer: Medicare Other

## 2016-10-13 ENCOUNTER — Other Ambulatory Visit (INDEPENDENT_AMBULATORY_CARE_PROVIDER_SITE_OTHER): Payer: Medicare Other

## 2016-10-13 DIAGNOSIS — J208 Acute bronchitis due to other specified organisms: Secondary | ICD-10-CM | POA: Diagnosis not present

## 2016-10-13 DIAGNOSIS — Z5181 Encounter for therapeutic drug level monitoring: Secondary | ICD-10-CM | POA: Diagnosis not present

## 2016-10-13 DIAGNOSIS — Z7901 Long term (current) use of anticoagulants: Secondary | ICD-10-CM | POA: Diagnosis not present

## 2016-10-13 DIAGNOSIS — R938 Abnormal findings on diagnostic imaging of other specified body structures: Secondary | ICD-10-CM

## 2016-10-13 DIAGNOSIS — R9389 Abnormal findings on diagnostic imaging of other specified body structures: Secondary | ICD-10-CM

## 2016-10-13 LAB — PROTIME-INR
INR: 3.5 ratio — ABNORMAL HIGH (ref 0.8–1.0)
Prothrombin Time: 37.3 s — ABNORMAL HIGH (ref 9.6–13.1)

## 2016-10-13 NOTE — Progress Notes (Signed)
Order placed for f/u cxr.  

## 2016-10-16 ENCOUNTER — Telehealth: Payer: Self-pay | Admitting: Internal Medicine

## 2016-10-16 NOTE — Telephone Encounter (Signed)
Pt called back returning your call. Pt is scheduled for lab 8/14 @ 2:30.

## 2016-10-17 ENCOUNTER — Encounter: Payer: Self-pay | Admitting: Internal Medicine

## 2016-10-17 ENCOUNTER — Other Ambulatory Visit: Payer: Self-pay | Admitting: Internal Medicine

## 2016-10-17 ENCOUNTER — Other Ambulatory Visit (INDEPENDENT_AMBULATORY_CARE_PROVIDER_SITE_OTHER): Payer: Medicare Other

## 2016-10-17 DIAGNOSIS — Z7901 Long term (current) use of anticoagulants: Secondary | ICD-10-CM

## 2016-10-17 DIAGNOSIS — R059 Cough, unspecified: Secondary | ICD-10-CM

## 2016-10-17 DIAGNOSIS — F4323 Adjustment disorder with mixed anxiety and depressed mood: Secondary | ICD-10-CM

## 2016-10-17 DIAGNOSIS — R9389 Abnormal findings on diagnostic imaging of other specified body structures: Secondary | ICD-10-CM

## 2016-10-17 DIAGNOSIS — R05 Cough: Secondary | ICD-10-CM

## 2016-10-17 LAB — PROTIME-INR
INR: 2.2 ratio — ABNORMAL HIGH (ref 0.8–1.0)
Prothrombin Time: 23.8 s — ABNORMAL HIGH (ref 9.6–13.1)

## 2016-10-17 NOTE — Progress Notes (Signed)
Order placed for f/u pt/inr 

## 2016-10-17 NOTE — Progress Notes (Signed)
Order placed for pulmonary referral.  

## 2016-10-24 ENCOUNTER — Ambulatory Visit (INDEPENDENT_AMBULATORY_CARE_PROVIDER_SITE_OTHER): Payer: Medicare Other

## 2016-10-24 ENCOUNTER — Encounter: Payer: Self-pay | Admitting: Family Medicine

## 2016-10-24 ENCOUNTER — Ambulatory Visit (INDEPENDENT_AMBULATORY_CARE_PROVIDER_SITE_OTHER): Payer: Medicare Other | Admitting: Family Medicine

## 2016-10-24 VITALS — BP 150/100 | HR 86 | Temp 97.9°F | Resp 20 | Wt 243.0 lb

## 2016-10-24 DIAGNOSIS — R059 Cough, unspecified: Secondary | ICD-10-CM

## 2016-10-24 DIAGNOSIS — R05 Cough: Secondary | ICD-10-CM | POA: Diagnosis not present

## 2016-10-24 NOTE — Patient Instructions (Signed)
I will call with the results.  Take care  Dr. Garima Chronis  

## 2016-10-25 ENCOUNTER — Ambulatory Visit (INDEPENDENT_AMBULATORY_CARE_PROVIDER_SITE_OTHER): Payer: Medicare Other | Admitting: Internal Medicine

## 2016-10-25 ENCOUNTER — Encounter: Payer: Self-pay | Admitting: Internal Medicine

## 2016-10-25 VITALS — BP 148/86 | HR 77 | Temp 98.5°F | Resp 14 | Wt 240.4 lb

## 2016-10-25 DIAGNOSIS — R05 Cough: Secondary | ICD-10-CM

## 2016-10-25 DIAGNOSIS — I10 Essential (primary) hypertension: Secondary | ICD-10-CM | POA: Diagnosis not present

## 2016-10-25 DIAGNOSIS — E119 Type 2 diabetes mellitus without complications: Secondary | ICD-10-CM | POA: Diagnosis not present

## 2016-10-25 DIAGNOSIS — H6121 Impacted cerumen, right ear: Secondary | ICD-10-CM | POA: Diagnosis not present

## 2016-10-25 DIAGNOSIS — R059 Cough, unspecified: Secondary | ICD-10-CM | POA: Insufficient documentation

## 2016-10-25 DIAGNOSIS — E7800 Pure hypercholesterolemia, unspecified: Secondary | ICD-10-CM | POA: Diagnosis not present

## 2016-10-25 DIAGNOSIS — Z7901 Long term (current) use of anticoagulants: Secondary | ICD-10-CM | POA: Diagnosis not present

## 2016-10-25 LAB — PROTIME-INR
INR: 2.4 ratio — ABNORMAL HIGH (ref 0.8–1.0)
PROTHROMBIN TIME: 25.9 s — AB (ref 9.6–13.1)

## 2016-10-25 MED ORDER — CARBAMIDE PEROXIDE 6.5 % OT SOLN: OTIC | 0 refills | Status: DC

## 2016-10-25 MED ORDER — PREDNISONE 10 MG PO TABS: ORAL_TABLET | ORAL | 0 refills | Status: DC

## 2016-10-25 NOTE — Assessment & Plan Note (Signed)
New acute problem. Acute uncomplicated illness. Xray negative. OTC cough medication. Follow up with pulm as scheduled.

## 2016-10-25 NOTE — Patient Instructions (Signed)
Saline nasal spray - flush nose at least 2-3x/day  nasacort nasal spray - 2 sprays each nostril one time per day.  Do this in the evening.  

## 2016-10-25 NOTE — Progress Notes (Signed)
   Subjective:  [name], female    DOB: 1941/02/10  Age: 76 y.o. MRN: [id number]  CC: Cough  HPI:  76 year old female presents with the above complaint.  Patient states that she has not been feeling well for the past 4-5 days. She states that she began to feel worse yesterday. She's had productive cough. She been using Alka-Seltzer with not much improvement. She denies fever. No reports of shortness of breath. No known exacerbating factors. No other associated symptoms. No other complaints or concerns at this time.  Social Hx   Social History   Social History  . Marital status: Married    Spouse name: N/A  . Number of children: N/A  . Years of education: N/A   Social History Main Topics  . Smoking status: Never Smoker  . Smokeless tobacco: Never Used  . Alcohol use No  . Drug use: No  . Sexual activity: Not Asked   Other Topics Concern  . None   Social History Narrative  . None    Review of Systems  Constitutional: Negative for fever.  Respiratory: Positive for cough. Negative for shortness of breath.    Objective:  BP (!) 150/100 (BP Location: Right Arm, Patient Position: Sitting, Cuff Size: Large)   Pulse 86   Temp 97.9 F (36.6 C) (Oral)   Resp 20   Wt 243 lb (110.2 kg)   SpO2 95%   BMI 38.06 kg/m   BP/Weight 10/24/2016 09/22/2016 5/46/5681  Systolic BP 275 170 017  Diastolic BP 494 84 60  Wt. (Lbs) 243 238.38 239.8  BMI 38.06 37.33 37.56   Physical Exam  Constitutional: She is oriented to person, place, and time. She appears well-developed. No distress.  Cardiovascular: Normal rate and regular rhythm.   Pulmonary/Chest: Effort normal. She has no wheezes. She has no rales.  Neurological: She is alert and oriented to person, place, and time.  Psychiatric: She has a normal mood and affect.  Vitals reviewed.   Lab Results  Component Value Date   WBC 6.9 01/21/2016   HGB 14.0 01/21/2016   HCT 41.4 01/21/2016   PLT 220.0  01/21/2016   GLUCOSE 104 (H) 09/08/2016   CHOL 131 09/15/2016   TRIG 104.0 09/15/2016   HDL 49.00 09/15/2016   LDLCALC 61 09/15/2016   ALT 16 09/08/2016   AST 20 09/08/2016   NA 139 09/08/2016   K 4.5 09/08/2016   CL 103 09/08/2016   CREATININE 0.96 09/08/2016   BUN 18 09/08/2016   CO2 31 09/08/2016   TSH 2.55 01/21/2016   INR 2.2 (H) 10/17/2016   HGBA1C 5.8 09/08/2016   MICROALBUR <0.7 01/21/2016    Assessment & Plan:   Problem List Items Addressed This Visit    Cough - Primary    New acute problem. Acute uncomplicated illness. Xray negative. OTC cough medication. Follow up with pulm as scheduled.       Relevant Orders   DG Chest 2 View (Completed)      Follow-up: PRN  [place]

## 2016-10-25 NOTE — Progress Notes (Signed)
Patient ID: Jo Ann Rowland [place], female   DOB: 01-23-1941, 76 y.o.   MRN: [id number]   Subjective:    Patient ID: Jo Ann Rowland [place], female    DOB: 21-Nov-1940, 76 y.o.   MRN: [id number]  HPI  Patient here for a scheduled follow up.  She was seen yesterday as a work in by Dr Lacinda Axon.  cxr negative for acute infection.  She is having persistent cough and congestion.  On questioning, she reports that after previous treatment with levaquin and prednisone, symptoms improved.  Was doing well, until the last week.  Took alka seltzer last week.  Over the last few days, cough has increased.  No sinus pressure or nasal congestion.  Does report her head is roaring - ear roaring.  Some decreased hearing.  No earache.  No headache.  No dizziness.  Some drainage.  No sore throat.  Some wheezing.  No acid reflux.  No abdominal pain or cramping.  No vomiting.  Bowels moving.     Past Medical History:  Diagnosis Date  . Anemia   . Atrial fibrillation ([place])   . Breast cancer ([place])    s/p lumpectomy 1992.  s/p chemo and xrt  . Diabetes mellitus ([place])   . Gastric ulcer   . GERD (gastroesophageal reflux disease)   . Hypercholesterolemia   . Hypertension   . Pulmonary emboli [facility] [place])    Past Surgical History:  Procedure Laterality Date  . BREAST LUMPECTOMY  03/06/1990   left breast   Family History  Problem Relation Age of Onset  . Heart disease Father   . Heart disease Brother        s/p CABG  . Colon cancer Neg Hx    Social History   Social History  . Marital status: Married    Spouse name: N/A  . Number of children: N/A  . Years of education: N/A   Social History Main Topics  . Smoking status: Never Smoker  . Smokeless tobacco: Never Used  . Alcohol use No  . Drug use: No  . Sexual activity: Not Asked   Other Topics Concern  . None   Social History Narrative  . None    Outpatient Encounter Prescriptions as of 10/25/2016  Medication Sig  . ferrous sulfate 325 (65 FE) MG tablet TAKE  ONE (1) TABLET BY MOUTH EVERY DAY  . losartan (COZAAR) 50 MG tablet TAKE ONE (1) TABLET EACH DAY  . pantoprazole (PROTONIX) 40 MG tablet Take 1 tablet (40 mg total) by mouth daily.  . simvastatin (ZOCOR) 10 MG tablet TAKE ONE (1) TABLET BY MOUTH EVERY DAY  . warfarin (COUMADIN) 2.5 MG tablet TAKE TWO (2) TABLETS BY MOUTH ON TUE, THURS, SAT, & SUN; TAKE ONE (1) BY MOUTH DAILY ALL OTHER DAYS (Patient taking differently: TAKE TWO (2) TABLETS BY MOUTH ON TUE, THURS,  & SUN; TAKE ONE (1) BY MOUTH DAILY ALL OTHER DAYS)  . carbamide peroxide (DEBROX) 6.5 % OTIC solution 4 drops in the right ear daily.  Massage for approximately 5 minutes.  . predniSONE (DELTASONE) 10 MG tablet Take 4 tablets x 1 day and then decrease by 1/2 tablet per day until down to zero mg.   No facility-administered encounter medications on file as of 10/25/2016.     Review of Systems  Constitutional: Negative for appetite change and unexpected weight change.  HENT: Negative for congestion and sinus pressure.        Some drainage.   Respiratory: Positive for  cough and wheezing. Negative for chest tightness and shortness of breath.   Cardiovascular: Negative for chest pain, palpitations and leg swelling.  Gastrointestinal: Negative for abdominal pain, diarrhea, nausea and vomiting.  Musculoskeletal: Negative for back pain and joint swelling.  Skin: Negative for color change and rash.  Neurological: Negative for dizziness, light-headedness and headaches.  Psychiatric/Behavioral: Negative for agitation and dysphoric mood.       Objective:     Blood pressure rechecked by me:  146/84-86  Physical Exam  Constitutional: She appears well-developed and well-nourished. No distress.  HENT:  Nose: Nose normal.  Mouth/Throat: Oropharynx is clear and moist.  Right ear - cerumen impaction.  Left ear - no cerumen impaction.    Neck: Neck supple. No thyromegaly present.  Cardiovascular: Normal rate and regular rhythm.     Pulmonary/Chest: Breath sounds normal. No respiratory distress.  No wheezing on exam.  Some increased cough with forced expiration.    Musculoskeletal: She exhibits no edema.  Lymphadenopathy:    She has no cervical adenopathy.  Skin: No rash noted. No erythema.  Psychiatric: She has a normal mood and affect. Her behavior is normal.    BP (!) 148/86 (BP Location: Right Arm, Patient Position: Sitting, Cuff Size: Large)   Pulse 77   Temp 98.5 F (36.9 C) (Oral)   Resp 14   Wt 240 lb 6.4 oz (109 kg)   SpO2 95%   BMI 37.65 kg/m  Wt Readings from Last 3 Encounters:  10/25/16 240 lb 6.4 oz (109 kg)  10/24/16 243 lb (110.2 kg)  09/22/16 238 lb 6 oz (108.1 kg)     Lab Results  Component Value Date   WBC 6.9 01/21/2016   HGB 14.0 01/21/2016   HCT 41.4 01/21/2016   PLT 220.0 01/21/2016   GLUCOSE 104 (H) 09/08/2016   CHOL 131 09/15/2016   TRIG 104.0 09/15/2016   HDL 49.00 09/15/2016   LDLCALC 61 09/15/2016   ALT 16 09/08/2016   AST 20 09/08/2016   NA 139 09/08/2016   K 4.5 09/08/2016   CL 103 09/08/2016   CREATININE 0.96 09/08/2016   BUN 18 09/08/2016   CO2 31 09/08/2016   TSH 2.55 01/21/2016   INR 2.4 (H) 10/25/2016   HGBA1C 5.8 09/08/2016   MICROALBUR <0.7 01/21/2016    Dg Chest 2 View  Result Date: 09/12/2016 CLINICAL DATA:  76 year old diabetic hypertensive female with cough and chills for 2 days. Initial encounter. EXAM: CHEST  2 VIEW COMPARISON:  None. FINDINGS: On the frontal view increased markings at the expected level of right middle lobe raises possibly of subtle infiltrate however this is not confirmed on lateral view. There are no remote exams available to determine if this represents chronic changes. This can be assessed on follow-up. Moderate-size hiatal hernia. Minimal peribronchial thickening. No pulmonary edema or pneumothorax. Calcified aortic knob. No focal thoracic compression fracture. Postsurgical changes left breast/ axilla without plain film  evidence of pulmonary metastatic disease. IMPRESSION: On the frontal view increased markings at the expected level of right middle lobe raises possibly of subtle infiltrate however this is not confirmed on lateral view. There are no remote exams available to determine if this represents chronic changes. This can be assessed on follow-up. Moderate-size hiatal hernia. Electronically Signed   By: Genia Del M.D.   On: 09/12/2016 16:44       Assessment & Plan:   Problem List Items Addressed This Visit    Chronic anticoagulation - Primary  Check pt/inr today.       Relevant Orders   Protime-INR (Completed)   Controlled type 2 diabetes mellitus without complication ([place])    Follow met b and a1c.   Lab Results  Component Value Date   HGBA1C 5.8 09/08/2016        Cough    Increased cough and congestion as outlined.  Had cxr yesterday - no acute abnormality.  Hold on abx.  Treat with prednisone taper as directed.  Saline nasal spray and nasacort nasal spray as outlined.  Follow closely.  Has an appt scheduled with pulmonary given persistent intermittent cough,etc.        Hypercholesteremia    On simvastatin.  Low cholesterol diet and exercise.  Follow lipid panel and liver function tests.        Hypertension    Blood pressure elevated today.  Discussed with her today.  Treat current symptoms.  Follow pressure.  See if improves.  Follow.  Hold on making changes in medication.  Follow.         Other Visit Diagnoses    Impacted cerumen of right ear       No pain.  Roaring as outlined.  cerumen impaction right ear.  debrox.  return for ear irrigation.         Einar Pheasant, MD

## 2016-10-25 NOTE — Progress Notes (Signed)
Pre-visit discussion using our clinic review tool. No additional management support is needed unless otherwise documented below in the visit note.  

## 2016-10-26 ENCOUNTER — Encounter: Payer: Self-pay | Admitting: Internal Medicine

## 2016-10-26 NOTE — Assessment & Plan Note (Signed)
On simvastatin.  Low cholesterol diet and exercise.  Follow lipid panel and liver function tests.   

## 2016-10-26 NOTE — Assessment & Plan Note (Signed)
Blood pressure elevated today.  Discussed with her today.  Treat current symptoms.  Follow pressure.  See if improves.  Follow.  Hold on making changes in medication.  Follow.

## 2016-10-26 NOTE — Assessment & Plan Note (Signed)
Increased cough and congestion as outlined.  Had cxr yesterday - no acute abnormality.  Hold on abx.  Treat with prednisone taper as directed.  Saline nasal spray and nasacort nasal spray as outlined.  Follow closely.  Has an appt scheduled with pulmonary given persistent intermittent cough,etc.

## 2016-10-26 NOTE — Assessment & Plan Note (Signed)
Check pt/inr today.  

## 2016-10-26 NOTE — Assessment & Plan Note (Signed)
Follow met b and a1c.   Lab Results  Component Value Date   HGBA1C 5.8 09/08/2016

## 2016-10-27 DIAGNOSIS — E119 Type 2 diabetes mellitus without complications: Secondary | ICD-10-CM | POA: Diagnosis not present

## 2016-10-29 ENCOUNTER — Emergency Department
Admission: EM | Admit: 2016-10-29 | Discharge: 2016-10-29 | Disposition: A | Discharge status: Home / Self Care | Payer: Medicare Other | Attending: Emergency Medicine | Admitting: Emergency Medicine

## 2016-10-29 DIAGNOSIS — Z7901 Long term (current) use of anticoagulants: Secondary | ICD-10-CM | POA: Insufficient documentation

## 2016-10-29 DIAGNOSIS — Z79899 Other long term (current) drug therapy: Secondary | ICD-10-CM | POA: Diagnosis not present

## 2016-10-29 DIAGNOSIS — Z853 Personal history of malignant neoplasm of breast: Secondary | ICD-10-CM | POA: Insufficient documentation

## 2016-10-29 DIAGNOSIS — E119 Type 2 diabetes mellitus without complications: Secondary | ICD-10-CM | POA: Insufficient documentation

## 2016-10-29 DIAGNOSIS — I10 Essential (primary) hypertension: Secondary | ICD-10-CM

## 2016-10-29 LAB — COMPREHENSIVE METABOLIC PANEL
ALT: 19 U/L (ref 14–54)
ANION GAP: 7 (ref 5–15)
AST: 20 U/L (ref 15–41)
Albumin: 3.8 g/dL (ref 3.5–5.0)
Alkaline Phosphatase: 37 U/L — ABNORMAL LOW (ref 38–126)
BILIRUBIN TOTAL: 0.5 mg/dL (ref 0.3–1.2)
BUN: 18 mg/dL (ref 6–20)
CO2: 25 mmol/L (ref 22–32)
CREATININE: 0.84 mg/dL (ref 0.44–1.00)
Calcium: 9.2 mg/dL (ref 8.9–10.3)
Chloride: 108 mmol/L (ref 101–111)
Glucose, Bld: 147 mg/dL — ABNORMAL HIGH (ref 65–99)
POTASSIUM: 4.3 mmol/L (ref 3.5–5.1)
Sodium: 140 mmol/L (ref 135–145)
Total Protein: 7.2 g/dL (ref 6.5–8.1)

## 2016-10-29 LAB — CBC
HEMATOCRIT: 42.8 % (ref 35.0–47.0)
Hemoglobin: 14.7 g/dL (ref 12.0–16.0)
MCH: 30.6 pg (ref 26.0–34.0)
MCHC: 34.5 g/dL (ref 32.0–36.0)
MCV: 88.8 fL (ref 80.0–100.0)
Platelets: 254 10*3/uL (ref 150–440)
RBC: 4.82 MIL/uL (ref 3.80–5.20)
RDW: 14.6 % — AB (ref 11.5–14.5)
WBC: 9.2 10*3/uL (ref 3.6–11.0)

## 2016-10-29 LAB — URINALYSIS, COMPLETE (UACMP) WITH MICROSCOPIC
Bacteria, UA: NONE SEEN
Bilirubin Urine: NEGATIVE
GLUCOSE, UA: NEGATIVE mg/dL
HGB URINE DIPSTICK: NEGATIVE
KETONES UR: NEGATIVE mg/dL
LEUKOCYTES UA: NEGATIVE
Nitrite: NEGATIVE
PROTEIN: NEGATIVE mg/dL
Specific Gravity, Urine: 1.008 (ref 1.005–1.030)
pH: 7 (ref 5.0–8.0)

## 2016-10-29 LAB — TROPONIN I: Troponin I: <0.03 ng/mL (ref <0.03)

## 2016-10-29 NOTE — ED Provider Notes (Signed)
[facility] Emergency Department Provider Note   ____________________________________________   I have reviewed the triage vital signs and the nursing notes.   HISTORY  Chief Complaint Hypertension and feeling funny  History limited by: Not Limited   HPI Anita Walker is a 76 y.o. female who presents to the emergency department today because of concern for elevated blood pressure and feeling funny. The patient states she had one episode last week when her blood pressure became elevated. Today it became elevated again and the patient stated that she "felt funny." Unfortunately she cannot describe the way she felt any further. She did however deny any chest pain, shortness of breath, palpitations. No nausea or vomiting. She denies history of elevated blood pressure.    Past Medical History:  Diagnosis Date  . Anemia   . Atrial fibrillation ([place])   . Breast cancer ([place])    s/p lumpectomy 1992.  s/p chemo and xrt  . Diabetes mellitus ([place])   . Gastric ulcer   . GERD (gastroesophageal reflux disease)   . Hypercholesterolemia   . Hypertension   . Pulmonary emboli [facility])     Patient Active Problem List   Diagnosis Date Noted  . Cough 10/25/2016  . Health care maintenance 05/23/2014  . Adjustment disorder with mixed anxiety and depressed mood 07/20/2013  . Chronic anticoagulation 04/11/2013  . Hypertension 01/01/2012  . Hypercholesteremia 01/01/2012  . History of breast cancer 01/01/2012  . Essential (primary) hypertension 01/01/2012  . Pure hypercholesterolemia 01/01/2012  . Controlled type 2 diabetes mellitus without complication ([place]) 73/41/9379    Past Surgical History:  Procedure Laterality Date  . BREAST LUMPECTOMY  03/06/1990   left breast    Prior to Admission medications   Medication Sig Start Date End Date Taking? Authorizing Provider  carbamide peroxide (DEBROX) 6.5 % OTIC solution 4 drops in the right ear daily.  Massage for  approximately 5 minutes. 10/25/16   Einar Pheasant, MD  ferrous sulfate 325 (65 FE) MG tablet TAKE ONE (1) TABLET BY MOUTH EVERY DAY 06/05/16   Einar Pheasant, MD  losartan (COZAAR) 50 MG tablet TAKE ONE (1) TABLET EACH DAY 09/08/16   Einar Pheasant, MD  pantoprazole (PROTONIX) 40 MG tablet Take 1 tablet (40 mg total) by mouth daily. 09/08/16   Einar Pheasant, MD  predniSONE (DELTASONE) 10 MG tablet Take 4 tablets x 1 day and then decrease by 1/2 tablet per day until down to zero mg. 10/25/16   Einar Pheasant, MD  simvastatin (ZOCOR) 10 MG tablet TAKE ONE (1) TABLET BY MOUTH EVERY DAY 09/08/16   Einar Pheasant, MD  warfarin (COUMADIN) 2.5 MG tablet TAKE TWO (2) TABLETS BY MOUTH ON TUE, THURS, SAT, & SUN; TAKE ONE (1) BY MOUTH DAILY ALL OTHER DAYS Patient taking differently: TAKE TWO (2) TABLETS BY MOUTH ON TUE, THURS,  & SUN; TAKE ONE (1) BY MOUTH DAILY ALL OTHER DAYS 07/24/16   Einar Pheasant, MD    Allergies Penicillins and Penicillin v potassium  Family History  Problem Relation Age of Onset  . Heart disease Father   . Heart disease Brother        s/p CABG  . Colon cancer Neg Hx     Social History Social History  Substance Use Topics  . Smoking status: Never Smoker  . Smokeless tobacco: Never Used  . Alcohol use No    Review of Systems Constitutional: No fever/chills Eyes: No visual changes. ENT: No sore throat. Cardiovascular: Denies chest pain.  Respiratory: Denies shortness of breath. Gastrointestinal: No abdominal pain.  No nausea, no vomiting.  No diarrhea.   Genitourinary: Negative for dysuria. Musculoskeletal: Negative for back pain. Skin: Negative for rash. Neurological: Negative for headaches, focal weakness or numbness.  ____________________________________________   PHYSICAL EXAM:  VITAL SIGNS: ED Triage Vitals  Enc Vitals Group     BP 10/29/16 1720 (!) 169/119     Pulse Rate 10/29/16 1720 96     Resp 10/29/16 1720 16     Temp 10/29/16 1720 97.6 F (36.4  C)     Temp Source 10/29/16 1720 Oral     SpO2 10/29/16 1720 96 %     Weight 10/29/16 1721 240 lb (108.9 kg)    Constitutional: Alert and oriented. Well appearing and in no distress. Eyes: Conjunctivae are normal.  ENT   Head: Normocephalic and atraumatic.   Nose: No congestion/rhinnorhea.   Mouth/Throat: Mucous membranes are moist.   Neck: No stridor. Hematological/Lymphatic/Immunilogical: No cervical lymphadenopathy. Cardiovascular: Normal rate, regular rhythm.  No murmurs, rubs, or gallops.  Respiratory: Normal respiratory effort without tachypnea nor retractions. Breath sounds are clear and equal bilaterally. No wheezes/rales/rhonchi. Gastrointestinal: Soft and non tender. No rebound. No guarding.  Genitourinary: Deferred Musculoskeletal: Normal range of motion in all extremities.  Neurologic:  Normal speech and language. No gross focal neurologic deficits are appreciated.  Skin:  Skin is warm, dry and intact. No rash noted. Psychiatric: Mood and affect are normal. Speech and behavior are normal. Patient exhibits appropriate insight and judgment.  ____________________________________________    LABS (pertinent positives/negatives)  Labs Reviewed  CBC - Abnormal; Notable for the following:       Result Value   RDW 14.6 (*)    All other components within normal limits  URINALYSIS, COMPLETE (UACMP) WITH MICROSCOPIC - Abnormal; Notable for the following:    Color, Urine STRAW (*)    APPearance CLEAR (*)    Squamous Epithelial / LPF 0-5 (*)    All other components within normal limits  COMPREHENSIVE METABOLIC PANEL - Abnormal; Notable for the following:    Glucose, Bld 147 (*)    Alkaline Phosphatase 37 (*)    All other components within normal limits  TROPONIN I  CBG MONITORING, ED     ____________________________________________   EKG  I, Nance Pear, attending physician, personally viewed and interpreted this EKG  EKG Time: 1721 Rate:  91 Rhythm: normal sinus rhythm Axis: normal Intervals: qtc 452 QRS: narrow ST changes: no st elevation Impression: normal ekg   ____________________________________________    RADIOLOGY  None  ____________________________________________   PROCEDURES  Procedures  ____________________________________________   INITIAL IMPRESSION / ASSESSMENT AND PLAN / ED COURSE  Pertinent labs & imaging results that were available during my care of the patient were reviewed by me and considered in my medical decision making (see chart for details).  Patient presented to the emergency department today because of concern for high blood pressure and feeling funny. Blood work without any concerning elevation in troponin. Mild elevation of glucose. Will plan on discharging to follow up with primary care physician.   ____________________________________________   FINAL CLINICAL IMPRESSION(S) / ED DIAGNOSES  Final diagnoses:  Hypertension, unspecified type     Note: This dictation was prepared with Dragon dictation. Any transcriptional errors that result from this process are unintentional     Nance Pear, MD 10/29/16 [phone]

## 2016-10-29 NOTE — ED Notes (Signed)
FIRST NURSE NOTE: Pt reports elevated BP. Pt flushed, offered wheelchair, but declined.

## 2016-10-29 NOTE — Discharge Instructions (Signed)
Please seek medical attention for any high fevers, chest pain, shortness of breath, change in behavior, persistent vomiting, bloody stool or any other new or concerning symptoms.  

## 2016-10-29 NOTE — ED Triage Notes (Signed)
Pt states that she was seen by her pcp last week for not feeling well, pt states that she had pneumonia recently and was given a steroid injection. Pt states that today she started feeling worse, weak all over, and broke out into a sweat. Denies one sided weakness, reports increased cough this am, pt has a hx of a clotting disorder and blood clots

## 2016-10-30 ENCOUNTER — Observation Stay
Admission: EM | Admit: 2016-10-30 | Discharge: 2016-11-02 | Disposition: A | Discharge status: Home / Self Care | Payer: Medicare Other | Attending: Internal Medicine | Admitting: Internal Medicine

## 2016-10-30 ENCOUNTER — Encounter: Payer: Self-pay | Admitting: Emergency Medicine

## 2016-10-30 ENCOUNTER — Telehealth: Payer: Self-pay | Admitting: Internal Medicine

## 2016-10-30 ENCOUNTER — Emergency Department: Payer: Medicare Other

## 2016-10-30 DIAGNOSIS — E785 Hyperlipidemia, unspecified: Secondary | ICD-10-CM | POA: Insufficient documentation

## 2016-10-30 DIAGNOSIS — Z79899 Other long term (current) drug therapy: Secondary | ICD-10-CM | POA: Diagnosis not present

## 2016-10-30 DIAGNOSIS — Z8719 Personal history of other diseases of the digestive system: Secondary | ICD-10-CM | POA: Diagnosis not present

## 2016-10-30 DIAGNOSIS — F4323 Adjustment disorder with mixed anxiety and depressed mood: Secondary | ICD-10-CM | POA: Diagnosis not present

## 2016-10-30 DIAGNOSIS — N289 Disorder of kidney and ureter, unspecified: Secondary | ICD-10-CM | POA: Diagnosis not present

## 2016-10-30 DIAGNOSIS — S72301A Unspecified fracture of shaft of right femur, initial encounter for closed fracture: Secondary | ICD-10-CM

## 2016-10-30 DIAGNOSIS — I2699 Other pulmonary embolism without acute cor pulmonale: Secondary | ICD-10-CM | POA: Diagnosis not present

## 2016-10-30 DIAGNOSIS — K219 Gastro-esophageal reflux disease without esophagitis: Secondary | ICD-10-CM | POA: Diagnosis not present

## 2016-10-30 DIAGNOSIS — I4891 Unspecified atrial fibrillation: Secondary | ICD-10-CM | POA: Diagnosis not present

## 2016-10-30 DIAGNOSIS — E7800 Pure hypercholesterolemia, unspecified: Secondary | ICD-10-CM | POA: Diagnosis not present

## 2016-10-30 DIAGNOSIS — Z853 Personal history of malignant neoplasm of breast: Secondary | ICD-10-CM | POA: Diagnosis not present

## 2016-10-30 DIAGNOSIS — Z8249 Family history of ischemic heart disease and other diseases of the circulatory system: Secondary | ICD-10-CM | POA: Diagnosis not present

## 2016-10-30 DIAGNOSIS — M62838 Other muscle spasm: Secondary | ICD-10-CM | POA: Insufficient documentation

## 2016-10-30 DIAGNOSIS — M7989 Other specified soft tissue disorders: Secondary | ICD-10-CM | POA: Insufficient documentation

## 2016-10-30 DIAGNOSIS — Z880 Allergy status to penicillin: Secondary | ICD-10-CM | POA: Diagnosis not present

## 2016-10-30 DIAGNOSIS — Z923 Personal history of irradiation: Secondary | ICD-10-CM | POA: Insufficient documentation

## 2016-10-30 DIAGNOSIS — M160 Bilateral primary osteoarthritis of hip: Secondary | ICD-10-CM | POA: Insufficient documentation

## 2016-10-30 DIAGNOSIS — Z86718 Personal history of other venous thrombosis and embolism: Secondary | ICD-10-CM | POA: Insufficient documentation

## 2016-10-30 DIAGNOSIS — I10 Essential (primary) hypertension: Secondary | ICD-10-CM | POA: Diagnosis not present

## 2016-10-30 DIAGNOSIS — M25561 Pain in right knee: Secondary | ICD-10-CM | POA: Diagnosis not present

## 2016-10-30 DIAGNOSIS — R52 Pain, unspecified: Secondary | ICD-10-CM

## 2016-10-30 DIAGNOSIS — Z9221 Personal history of antineoplastic chemotherapy: Secondary | ICD-10-CM | POA: Insufficient documentation

## 2016-10-30 DIAGNOSIS — M25551 Pain in right hip: Secondary | ICD-10-CM | POA: Diagnosis not present

## 2016-10-30 DIAGNOSIS — Z7901 Long term (current) use of anticoagulants: Secondary | ICD-10-CM | POA: Insufficient documentation

## 2016-10-30 DIAGNOSIS — R55 Syncope and collapse: Secondary | ICD-10-CM | POA: Diagnosis not present

## 2016-10-30 DIAGNOSIS — M79604 Pain in right leg: Secondary | ICD-10-CM | POA: Diagnosis not present

## 2016-10-30 DIAGNOSIS — Z86711 Personal history of pulmonary embolism: Secondary | ICD-10-CM | POA: Diagnosis not present

## 2016-10-30 DIAGNOSIS — R918 Other nonspecific abnormal finding of lung field: Secondary | ICD-10-CM | POA: Diagnosis not present

## 2016-10-30 DIAGNOSIS — I951 Orthostatic hypotension: Secondary | ICD-10-CM | POA: Diagnosis not present

## 2016-10-30 DIAGNOSIS — E119 Type 2 diabetes mellitus without complications: Secondary | ICD-10-CM | POA: Insufficient documentation

## 2016-10-30 DIAGNOSIS — M79651 Pain in right thigh: Secondary | ICD-10-CM | POA: Diagnosis not present

## 2016-10-30 LAB — CBC WITH DIFFERENTIAL/PLATELET
BASOS ABS: 0.1 10*3/uL (ref 0–0.1)
BASOS PCT: 1 %
EOS ABS: 0 10*3/uL (ref 0–0.7)
EOS PCT: 0 %
HCT: 44.9 % (ref 35.0–47.0)
Hemoglobin: 15.1 g/dL (ref 12.0–16.0)
Lymphocytes Relative: 12 %
Lymphs Abs: 1.5 10*3/uL (ref 1.0–3.6)
MCH: 30.1 pg (ref 26.0–34.0)
MCHC: 33.7 g/dL (ref 32.0–36.0)
MCV: 89.2 fL (ref 80.0–100.0)
Monocytes Absolute: 1 10*3/uL — ABNORMAL HIGH (ref 0.2–0.9)
Monocytes Relative: 8 %
Neutro Abs: 10.4 10*3/uL — ABNORMAL HIGH (ref 1.4–6.5)
Neutrophils Relative %: 79 %
PLATELETS: 258 10*3/uL (ref 150–440)
RBC: 5.04 MIL/uL (ref 3.80–5.20)
RDW: 15.1 % — ABNORMAL HIGH (ref 11.5–14.5)
WBC: 13.1 10*3/uL — AB (ref 3.6–11.0)

## 2016-10-30 LAB — COMPREHENSIVE METABOLIC PANEL
ALBUMIN: 4.1 g/dL (ref 3.5–5.0)
ALT: 19 U/L (ref 14–54)
AST: 22 U/L (ref 15–41)
Alkaline Phosphatase: 39 U/L (ref 38–126)
Anion gap: 10 (ref 5–15)
BUN: 18 mg/dL (ref 6–20)
CHLORIDE: 104 mmol/L (ref 101–111)
CO2: 25 mmol/L (ref 22–32)
CREATININE: 0.93 mg/dL (ref 0.44–1.00)
Calcium: 10 mg/dL (ref 8.9–10.3)
GFR calc non Af Amer: 57 mL/min — ABNORMAL LOW (ref >60)
GLUCOSE: 169 mg/dL — AB (ref 65–99)
Potassium: 4.1 mmol/L (ref 3.5–5.1)
SODIUM: 139 mmol/L (ref 135–145)
Total Bilirubin: 0.7 mg/dL (ref 0.3–1.2)
Total Protein: 7.4 g/dL (ref 6.5–8.1)

## 2016-10-30 LAB — LACTIC ACID, PLASMA: Lactic Acid, Venous: 1.1 mmol/L (ref 0.5–1.9)

## 2016-10-30 LAB — URINALYSIS, COMPLETE (UACMP) WITH MICROSCOPIC
BACTERIA UA: NONE SEEN
BILIRUBIN URINE: NEGATIVE
GLUCOSE, UA: NEGATIVE mg/dL
HGB URINE DIPSTICK: NEGATIVE
KETONES UR: NEGATIVE mg/dL
NITRITE: NEGATIVE
Protein, ur: NEGATIVE mg/dL
Specific Gravity, Urine: 1.014 (ref 1.005–1.030)
Squamous Epithelial / LPF: NONE SEEN
pH: 6 (ref 5.0–8.0)

## 2016-10-30 LAB — PROTIME-INR
INR: 2.36
INR: 2.4
Prothrombin Time: 26.2 seconds — ABNORMAL HIGH (ref 11.4–15.2)
Prothrombin Time: 26.6 seconds — ABNORMAL HIGH (ref 11.4–15.2)

## 2016-10-30 MED ORDER — WARFARIN SODIUM 5 MG PO TABS: 5.0000 mg | ORAL_TABLET | ORAL | Status: DC

## 2016-10-30 MED ORDER — PANTOPRAZOLE SODIUM 40 MG PO TBEC
40.0000 mg | DELAYED_RELEASE_TABLET | Freq: Every day | ORAL | Status: DC
  Administered 2016-10-31 – 2016-11-02 (×2): 40 mg via ORAL
  Filled 2016-10-30 (×3): qty 1

## 2016-10-30 MED ORDER — WARFARIN SODIUM 2.5 MG PO TABS
2.5000 mg | ORAL_TABLET | ORAL | Status: DC
  Administered 2016-10-30: 2.5 mg via ORAL
  Filled 2016-10-30 (×2): qty 1

## 2016-10-30 MED ORDER — FENTANYL CITRATE (PF) 100 MCG/2ML IJ SOLN
50.0000 ug | Freq: Once | INTRAMUSCULAR | Status: AC
  Administered 2016-10-30: 50 ug via INTRAVENOUS
  Filled 2016-10-30: qty 2

## 2016-10-30 MED ORDER — HYDRALAZINE HCL 20 MG/ML IJ SOLN
10.0000 mg | Freq: Four times a day (QID) | INTRAMUSCULAR | Status: DC | PRN
  Administered 2016-10-30: 10 mg via INTRAVENOUS

## 2016-10-30 MED ORDER — SODIUM CHLORIDE 0.9 % IV SOLN: 250.0000 mL | INTRAVENOUS | Status: DC | PRN

## 2016-10-30 MED ORDER — ACETAMINOPHEN 500 MG PO TABS
1000.0000 mg | ORAL_TABLET | Freq: Once | ORAL | Status: AC
  Administered 2016-10-30: 1000 mg via ORAL
  Filled 2016-10-30: qty 2

## 2016-10-30 MED ORDER — SIMVASTATIN 10 MG PO TABS
10.0000 mg | ORAL_TABLET | Freq: Every day | ORAL | Status: DC
  Administered 2016-10-31 – 2016-11-01 (×2): 10 mg via ORAL
  Filled 2016-10-30 (×3): qty 1

## 2016-10-30 MED ORDER — SODIUM CHLORIDE 0.9% FLUSH
3.0000 mL | Freq: Two times a day (BID) | INTRAVENOUS | Status: DC
  Administered 2016-10-30 – 2016-11-01 (×5): 3 mL via INTRAVENOUS

## 2016-10-30 MED ORDER — OXYCODONE HCL 5 MG PO TABS
5.0000 mg | ORAL_TABLET | Freq: Once | ORAL | Status: AC
  Administered 2016-10-30: 5 mg via ORAL
  Filled 2016-10-30: qty 1

## 2016-10-30 MED ORDER — LOSARTAN POTASSIUM 50 MG PO TABS: 50.0000 mg | ORAL_TABLET | Freq: Every day | ORAL | Status: DC

## 2016-10-30 MED ORDER — LOSARTAN POTASSIUM 50 MG PO TABS
50.0000 mg | ORAL_TABLET | Freq: Every day | ORAL | Status: DC
  Administered 2016-10-30 – 2016-10-31 (×2): 50 mg via ORAL
  Filled 2016-10-30 (×2): qty 1

## 2016-10-30 MED ORDER — FERROUS SULFATE 325 (65 FE) MG PO TABS
325.0000 mg | ORAL_TABLET | Freq: Two times a day (BID) | ORAL | Status: DC
  Administered 2016-10-31 – 2016-11-02 (×5): 325 mg via ORAL
  Filled 2016-10-30 (×5): qty 1

## 2016-10-30 MED ORDER — HYDROCODONE-ACETAMINOPHEN 5-325 MG PO TABS
1.0000 | ORAL_TABLET | ORAL | Status: DC | PRN
  Administered 2016-10-31: 2 via ORAL
  Administered 2016-11-01: 1 via ORAL
  Filled 2016-10-30: qty 2
  Filled 2016-10-30: qty 1

## 2016-10-30 MED ORDER — ONDANSETRON HCL 4 MG PO TABS: 4.0000 mg | ORAL_TABLET | Freq: Four times a day (QID) | ORAL | Status: DC | PRN

## 2016-10-30 MED ORDER — ACETAMINOPHEN 650 MG RE SUPP: 650.0000 mg | Freq: Four times a day (QID) | RECTAL | Status: DC | PRN

## 2016-10-30 MED ORDER — HYDRALAZINE HCL 20 MG/ML IJ SOLN
INTRAMUSCULAR | Status: AC
  Administered 2016-10-30: 10 mg via INTRAVENOUS
  Filled 2016-10-30: qty 1

## 2016-10-30 MED ORDER — SODIUM CHLORIDE 0.9% FLUSH: 3.0000 mL | INTRAVENOUS | Status: DC | PRN

## 2016-10-30 MED ORDER — WARFARIN SODIUM 5 MG PO TABS: 5.0000 mg | ORAL_TABLET | Freq: Once | ORAL | Status: DC

## 2016-10-30 MED ORDER — ACETAMINOPHEN 325 MG PO TABS
650.0000 mg | ORAL_TABLET | Freq: Four times a day (QID) | ORAL | Status: DC | PRN
  Administered 2016-10-31: 650 mg via ORAL
  Filled 2016-10-30: qty 2

## 2016-10-30 MED ORDER — CARBAMIDE PEROXIDE 6.5 % OT SOLN
10.0000 [drp] | Freq: Two times a day (BID) | OTIC | Status: DC
  Administered 2016-10-30 – 2016-11-02 (×6): 10 [drp] via OTIC
  Filled 2016-10-30: qty 15

## 2016-10-30 MED ORDER — HYDROMORPHONE HCL 1 MG/ML IJ SOLN
1.0000 mg | INTRAMUSCULAR | Status: DC | PRN
  Administered 2016-10-30 (×2): 1 mg via INTRAVENOUS
  Filled 2016-10-30 (×2): qty 1

## 2016-10-30 MED ORDER — LORAZEPAM 0.5 MG PO TABS
0.5000 mg | ORAL_TABLET | Freq: Four times a day (QID) | ORAL | Status: DC | PRN
  Administered 2016-10-30: 0.5 mg via ORAL
  Filled 2016-10-30: qty 1

## 2016-10-30 MED ORDER — INSULIN ASPART 100 UNIT/ML ~~LOC~~ SOLN
0.0000 [IU] | Freq: Three times a day (TID) | SUBCUTANEOUS | Status: DC
  Administered 2016-11-02 (×2): 1 [IU] via SUBCUTANEOUS
  Filled 2016-10-30 (×2): qty 1

## 2016-10-30 MED ORDER — ONDANSETRON HCL 4 MG/2ML IJ SOLN: 4.0000 mg | Freq: Four times a day (QID) | INTRAMUSCULAR | Status: DC | PRN

## 2016-10-30 MED ORDER — WARFARIN - PHARMACIST DOSING INPATIENT
Freq: Every day | Status: DC
  Administered 2016-11-01: 17:00:00
  Filled 2016-10-30 (×2): qty 1

## 2016-10-30 NOTE — ED Notes (Signed)
Attempt to ambulate pt with this RN, pt able to sit on side of bed for a few seconds before stating she needs to lie back down. Pt reports increased pain with movement of right leg. Pt states she is not able to try and stand up with walker and gait belt on. EDP notified.

## 2016-10-30 NOTE — ED Provider Notes (Signed)
[facility] Emergency Department Provider Note  ____________________________________________  Time seen: Approximately 4:05 PM  I have reviewed the triage vital signs and the nursing notes.   HISTORY  Chief Complaint Leg Pain   HPI Anita Walker is a 76 y.o. female with a history of A. Fib on Coumadin, diabetes, hypertension, DVT and PE who presents for evaluation of right lower extremity pain.Patient reports that she woke up with this pain 3 days ago. Initially the pain was mild but today she has been unable to walk due to the severity of the pain. She reports that the pain is sharp starts in the posterior aspect of her thigh and radiates down to her knee. She denies worsening swelling of her lower extremities, fever or chills, fall or trauma, weakness or numbness of her leg. She reports no pain laying in the bed while talking to me but severe pain with any movement of her leg.  Past Medical History:  Diagnosis Date  . Anemia   . Atrial fibrillation ([place])   . Breast cancer ([place])    s/p lumpectomy 1992.  s/p chemo and xrt  . Diabetes mellitus ([place])   . Gastric ulcer   . GERD (gastroesophageal reflux disease)   . Hypercholesterolemia   . Hypertension   . Pulmonary emboli [facility])     Patient Active Problem List   Diagnosis Date Noted  . Cough 10/25/2016  . Health care maintenance 05/23/2014  . Adjustment disorder with mixed anxiety and depressed mood 07/20/2013  . Chronic anticoagulation 04/11/2013  . Hypertension 01/01/2012  . Hypercholesteremia 01/01/2012  . History of breast cancer 01/01/2012  . Essential (primary) hypertension 01/01/2012  . Pure hypercholesterolemia 01/01/2012  . Controlled type 2 diabetes mellitus without complication ([place]) 66/44/0347    Past Surgical History:  Procedure Laterality Date  . BREAST LUMPECTOMY  03/06/1990   left breast    Prior to Admission medications   Medication Sig Start Date End Date Taking?  Authorizing Provider  ferrous sulfate 325 (65 FE) MG tablet TAKE ONE (1) TABLET BY MOUTH EVERY DAY 06/05/16  Yes Einar Pheasant, MD  losartan (COZAAR) 50 MG tablet TAKE ONE (1) TABLET EACH DAY 09/08/16  Yes Einar Pheasant, MD  pantoprazole (PROTONIX) 40 MG tablet Take 1 tablet (40 mg total) by mouth daily. 09/08/16  Yes Einar Pheasant, MD  simvastatin (ZOCOR) 10 MG tablet TAKE ONE (1) TABLET BY MOUTH EVERY DAY 09/08/16  Yes Einar Pheasant, MD  warfarin (COUMADIN) 2.5 MG tablet TAKE TWO (2) TABLETS BY MOUTH ON TUE, THURS, SAT, & SUN; TAKE ONE (1) BY MOUTH DAILY ALL OTHER DAYS Patient taking differently: TAKE TWO (2) TABLETS BY MOUTH ON Sunday, Tuesday, AND Thursday. TAKE 1 TAB ON Monday, Thursday, Wednesday, Friday AND Saturday. 07/24/16  Yes Einar Pheasant, MD  carbamide peroxide (DEBROX) 6.5 % OTIC solution 4 drops in the right ear daily.  Massage for approximately 5 minutes. Patient not taking: Reported on 10/30/2016 10/25/16   Einar Pheasant, MD  predniSONE (DELTASONE) 10 MG tablet Take 4 tablets x 1 day and then decrease by 1/2 tablet per day until down to zero mg. Patient not taking: Reported on 10/30/2016 10/25/16   Einar Pheasant, MD    Allergies Penicillins and Penicillin v potassium  Family History  Problem Relation Age of Onset  . Heart disease Father   . Heart disease Brother        s/p CABG  . Colon cancer Neg Hx     Social  History Social History  Substance Use Topics  . Smoking status: Never Smoker  . Smokeless tobacco: Never Used  . Alcohol use No    Review of Systems  Constitutional: Negative for fever. Eyes: Negative for visual changes. ENT: Negative for sore throat. Neck: No neck pain  Cardiovascular: Negative for chest pain. Respiratory: Negative for shortness of breath. Gastrointestinal: Negative for abdominal pain, vomiting or diarrhea. Genitourinary: Negative for dysuria. Musculoskeletal: Negative for back pain. + RLE pain Skin: Negative for  rash. Neurological: Negative for headaches, weakness or numbness. Psych: No SI or HI  ____________________________________________   PHYSICAL EXAM:  VITAL SIGNS: ED Triage Vitals  Enc Vitals Group     BP 10/30/16 1236 (!) 167/91     Pulse Rate 10/30/16 1236 99     Resp 10/30/16 1236 18     Temp 10/30/16 1236 98 F (36.7 C)     Temp Source 10/30/16 1236 Oral     SpO2 10/30/16 1236 99 %     Weight 10/30/16 1237 241 lb (109.3 kg)     Height 10/30/16 1237 5\' 9"  (1.753 m)     Head Circumference --      Peak Flow --      Pain Score 10/30/16 1236 10     Pain Loc --      Pain Edu? --      Excl. in [place]? --     Constitutional: Alert and oriented. Well appearing and in no apparent distress. HEENT:      Head: Normocephalic and atraumatic.         Eyes: Conjunctivae are normal. Sclera is non-icteric.       Mouth/Throat: Mucous membranes are moist.       Neck: Supple with no signs of meningismus. Cardiovascular: Regular rate and rhythm. No murmurs, gallops, or rubs. 2+ symmetrical distal pulses are present in all extremities. No JVD. Respiratory: Normal respiratory effort. Lungs are clear to auscultation bilaterally. No wheezes, crackles, or rhonchi.  Gastrointestinal: Soft, non tender, and non distended with positive bowel sounds. No rebound or guarding. Musculoskeletal: right lower extremity is warm and well perfused with stronger PT and DP pulses, brisk capillary feels, normal passive painless range of motion of all her extremities. Patient with limited active movement due to pain on the posterior aspect of her thigh. Compartments are soft. There is no bruising. No crepitus. No tenderness on her hip. Neurologic: Normal speech and language. Face is symmetric. Moving all extremities. No gross focal neurologic deficits are appreciated. Skin: Skin is warm, dry and intact. No rash noted. Psychiatric: Mood and affect are normal. Speech and behavior are  normal.  ____________________________________________   LABS (all labs ordered are listed, but only abnormal results are displayed)  Labs Reviewed  PROTIME-INR - Abnormal; Notable for the following:       Result Value   Prothrombin Time 26.2 (*)    All other components within normal limits  CBC WITH DIFFERENTIAL/PLATELET - Abnormal; Notable for the following:    WBC 13.1 (*)    RDW 15.1 (*)    Neutro Abs 10.4 (*)    Monocytes Absolute 1.0 (*)    All other components within normal limits  COMPREHENSIVE METABOLIC PANEL - Abnormal; Notable for the following:    Glucose, Bld 169 (*)    GFR calc non Af Amer 57 (*)    All other components within normal limits  LACTIC ACID, PLASMA  URINALYSIS, COMPLETE (UACMP) WITH MICROSCOPIC   ____________________________________________  EKG  ED ECG REPORT I, Rudene Re, the attending physician, personally viewed and interpreted this ECG.  Normal sinus rhythm, rate of 91, normal intervals,normal axis, no ST elevations or depressions. Normal EKG. ____________________________________________  RADIOLOGY  Doppler US: Negative for DVT  XR R hip and femur: 1. Smooth cortical thickening involving midshaft of the right femur with mild smooth periosteal reaction, findings could be secondary to subacute/healing fracture, clinical correlation is recommended. 2. There are otherwise no acute osseous abnormality seen  ____________________________________________   PROCEDURES  Procedure(s) performed: None Procedures Critical Care performed:  None ____________________________________________   INITIAL IMPRESSION / ASSESSMENT AND PLAN / ED COURSE  76 y.o. female with a history of A. Fib on Coumadin, diabetes, hypertension, DVT and PE who presents for evaluation of right lower extremity pain. Pain is only present with movement of the leg, no pain on palpation. No signs or symptoms of ischemia with intact strong pulses, warm extremity and no  pain at rest, lactic has been ordered.There is no evidence of trauma, no bruising, no crepitus. There is no evidence of infection. Pain seems to be in the posterior aspect of her leg therefore sciatica is a possibility. Will get XR of the femur and hip to rule out fracture. Will give fentanyl and tylenol for pain    _________________________ 6:54 PM on 10/30/2016 -----------------------------------------  X-ray concerning for a subacute femur fracture. Patient unable to ambulate due to pain after two rounds of narcotics. I discussed with Dr. Rudene Christians who recommended admission for pain control and bone scan. Will discuss with the hospitalist/  Pertinent labs & imaging results that were available during my care of the patient were reviewed by me and considered in my medical decision making (see chart for details).    ____________________________________________   FINAL CLINICAL IMPRESSION(S) / ED DIAGNOSES  Final diagnoses:  Closed fracture of shaft of right femur, unspecified fracture morphology, initial encounter ([place])      NEW MEDICATIONS STARTED DURING THIS VISIT:  New Prescriptions   No medications on file     Note:  This document was prepared using Dragon voice recognition software and may include unintentional dictation errors.    Alfred Levins, [place], MD 10/30/16 [phone]

## 2016-10-30 NOTE — Telephone Encounter (Signed)
Is there somewhere you would like [place] to put her?

## 2016-10-30 NOTE — Progress Notes (Signed)
Pt. Takes losartin at night and orders are for tomorrow.. Dr. Jannifer Franklin ordered for losartin to be taken tonight,

## 2016-10-30 NOTE — ED Notes (Signed)
Pt ate grilled chicken sand brought by daughter

## 2016-10-30 NOTE — H&P (Signed)
[place] [facility] [place] NAME: Anita Walker    MR#:  [id number]  DATE OF BIRTH:  [birth year]  DATE OF ADMISSION:  10/30/2016  PRIMARY CARE PHYSICIAN: Einar Pheasant, MD   REQUESTING/REFERRING PHYSICIAN: Charyl Bigger MD  CHIEF COMPLAINT:   Chief Complaint  Patient presents with  . Leg Pain    HISTORY OF PRESENT ILLNESS: Anita Walker  is a 76 y.o. female with a known history of  Chronic anemia, atrial fibrillation, history of breast cancer, diabetes type 2, GERD, hyperlipidemia, hypertension who is presenting to the emergency room with complaint of having pain in her right hip. She reports that she's been having pain for the past few days however since today the pain has gotten worse. Patient had x-ray in the emergency room which was nonconclusive. The emergency room physician discussed the case with Dr. Luz Brazen orthopedics who reviewed the x-ray and recommended a bone scan. Patient not able to bear weight therefore were asked to admit the patient. Also her blood pressure is elevated. PAST MEDICAL HISTORY:   Past Medical History:  Diagnosis Date  . Anemia   . Atrial fibrillation ([place])   . Breast cancer ([place])    s/p lumpectomy 1992.  s/p chemo and xrt  . Diabetes mellitus ([place])   . Gastric ulcer   . GERD (gastroesophageal reflux disease)   . Hypercholesterolemia   . Hypertension   . Pulmonary emboli ([place])     PAST SURGICAL HISTORY: Past Surgical History:  Procedure Laterality Date  . BREAST LUMPECTOMY  03/06/1990   left breast    SOCIAL HISTORY:  Social History  Substance Use Topics  . Smoking status: Never Smoker  . Smokeless tobacco: Never Used  . Alcohol use No    FAMILY HISTORY:  Family History  Problem Relation Age of Onset  . Heart disease Father   . Heart disease Brother        s/p CABG  . Colon cancer Neg Hx     DRUG ALLERGIES:  Allergies  Allergen Reactions  . Penicillins Other (See Comments)    Has patient had a  PCN reaction causing immediate rash, facial/tongue/throat swelling, SOB or lightheadedness with hypotension: Unknown Has patient had a PCN reaction causing severe rash involving mucus membranes or skin necrosis: Unknown Has patient had a PCN reaction that required hospitalization: Unknown Has patient had a PCN reaction occurring within the last 10 years: Unknown If all of the above answers are "NO", then may proceed with Cephalosporin use.   Marland Kitchen Penicillin V Potassium Nausea And Vomiting    REVIEW OF SYSTEMS:   CONSTITUTIONAL: No fever, fatigue or weakness.  EYES: No blurred or double vision.  EARS, NOSE, AND THROAT: No tinnitus or ear pain.  RESPIRATORY: No cough, shortness of breath, wheezing or hemoptysis.  CARDIOVASCULAR: No chest pain, orthopnea, edema.  GASTROINTESTINAL: No nausea, vomiting, diarrhea or abdominal pain.  GENITOURINARY: No dysuria, hematuria.  ENDOCRINE: No polyuria, nocturia,  HEMATOLOGY: No anemia, easy bruising or bleeding SKIN: No rash or lesion. MUSCULOSKELETAL: right hip pain NEUROLOGIC: No tingling, numbness, weakness.  PSYCHIATRY: No anxiety or depression.   MEDICATIONS AT HOME:  Prior to Admission medications   Medication Sig Start Date End Date Taking? Authorizing Provider  ferrous sulfate 325 (65 FE) MG tablet TAKE ONE (1) TABLET BY MOUTH EVERY DAY 06/05/16  Yes Einar Pheasant, MD  losartan (COZAAR) 50 MG tablet TAKE ONE (1) TABLET EACH DAY 09/08/16  Yes Einar Pheasant, MD  pantoprazole (PROTONIX) 40 MG tablet Take 1 tablet (40 mg total) by mouth daily. 09/08/16  Yes Einar Pheasant, MD  simvastatin (ZOCOR) 10 MG tablet TAKE ONE (1) TABLET BY MOUTH EVERY DAY 09/08/16  Yes Einar Pheasant, MD  warfarin (COUMADIN) 2.5 MG tablet TAKE TWO (2) TABLETS BY MOUTH ON TUE, THURS, SAT, & SUN; TAKE ONE (1) BY MOUTH DAILY ALL OTHER DAYS Patient taking differently: TAKE TWO (2) TABLETS BY MOUTH ON Sunday, Tuesday, AND Thursday. Take 1 tab on Monday, Wednesday, Friday and  saturday 07/24/16  Yes Scott, Randell Patient, MD  carbamide peroxide (DEBROX) 6.5 % OTIC solution 4 drops in the right ear daily.  Massage for approximately 5 minutes. Patient not taking: Reported on 10/30/2016 10/25/16   Einar Pheasant, MD  predniSONE (DELTASONE) 10 MG tablet Take 4 tablets x 1 day and then decrease by 1/2 tablet per day until down to zero mg. Patient not taking: Reported on 10/30/2016 10/25/16   Einar Pheasant, MD      PHYSICAL EXAMINATION:   VITAL SIGNS: Blood pressure (!) 172/75, pulse 76, temperature 98 F (36.7 C), temperature source Oral, resp. rate 19, height 5\' 9"  (1.753 m), weight 241 lb (109.3 kg), SpO2 96 %.  GENERAL:  76 y.o.-year-old patient lying in the bed with no acute distress.  EYES: Pupils equal, round, reactive to light and accommodation. No scleral icterus. Extraocular muscles intact.  HEENT: Head atraumatic, normocephalic. Oropharynx and nasopharynx clear.  NECK:  Supple, no jugular venous distention. No thyroid enlargement, no tenderness.  LUNGS: Normal breath sounds bilaterally, no wheezing, rales,rhonchi or crepitation. No use of accessory muscles of respiration.  CARDIOVASCULAR: S1, S2 normal. No murmurs, rubs, or gallops.  ABDOMEN: Soft, nontender, nondistended. Bowel sounds present. No organomegaly or mass.  EXTREMITIES: difficulty with flexing and extending the right hip NEUROLOGIC: Cranial nerves II through XII are intact. Muscle strength 5/5 in all extremities. Sensation intact. Gait not checked.  PSYCHIATRIC: The patient is alert and oriented x 3.  SKIN: No obvious rash, lesion, or ulcer.   LABORATORY PANEL:   CBC  Recent Labs Lab 10/29/16 1740 10/30/16 1249  WBC 9.2 13.1*  HGB 14.7 15.1  HCT 42.8 44.9  PLT 254 258  MCV 88.8 89.2  MCH 30.6 30.1  MCHC 34.5 33.7  RDW 14.6* 15.1*  LYMPHSABS  --  1.5  MONOABS  --  1.0*  EOSABS  --  0.0  BASOSABS  --  0.1    ------------------------------------------------------------------------------------------------------------------  Chemistries   Recent Labs Lab 10/29/16 1740 10/30/16 1249  NA 140 139  K 4.3 4.1  CL 108 104  CO2 25 25  GLUCOSE 147* 169*  BUN 18 18  CREATININE 0.84 0.93  CALCIUM 9.2 10.0  AST 20 22  ALT 19 19  ALKPHOS 37* 39  BILITOT 0.5 0.7   ------------------------------------------------------------------------------------------------------------------ estimated creatinine clearance is 65.6 mL/min (by C-G formula based on SCr of 0.93 mg/dL). ------------------------------------------------------------------------------------------------------------------ No results for input(s): TSH, T4TOTAL, T3FREE, THYROIDAB in the last 72 hours.  Invalid input(s): FREET3   Coagulation profile  Recent Labs Lab 10/25/16 1252 10/30/16 1249  INR 2.4* 2.36   ------------------------------------------------------------------------------------------------------------------- No results for input(s): DDIMER in the last 72 hours. -------------------------------------------------------------------------------------------------------------------  Cardiac Enzymes  Recent Labs Lab 10/29/16 1740  TROPONINI <0.03   ------------------------------------------------------------------------------------------------------------------ Invalid input(s): POCBNP  ---------------------------------------------------------------------------------------------------------------  Urinalysis    Component Value Date/Time   COLORURINE STRAW (A) 10/29/2016 1850   APPEARANCEUR CLEAR (A) 10/29/2016 1850   APPEARANCEUR Clear 07/16/2013 1405   LABSPEC 1.008 10/29/2016  [place] 1.008 07/16/2013 1405   PHURINE 7.0 10/29/2016 1850   GLUCOSEU NEGATIVE 10/29/2016 1850   GLUCOSEU Negative 07/16/2013 1405   HGBUR NEGATIVE 10/29/2016 1850   BILIRUBINUR NEGATIVE 10/29/2016 1850   BILIRUBINUR neg  07/18/2013 0948   BILIRUBINUR Negative 07/16/2013 [place] 10/29/2016 1850   PROTEINUR NEGATIVE 10/29/2016 1850   UROBILINOGEN 0.2 07/18/2013 0948   NITRITE NEGATIVE 10/29/2016 1850   LEUKOCYTESUR NEGATIVE 10/29/2016 1850   LEUKOCYTESUR 3+ 07/16/2013 1405     RADIOLOGY: Dg Pelvis 1-2 Views  Result Date: 10/30/2016 CLINICAL DATA:  Right knee pain EXAM: PELVIS - 1-2 VIEW COMPARISON:  None. FINDINGS: SI joints are symmetric. Multiple calcified phleboliths in the pelvis. Pubic symphysis and rami appear intact. Moderate degenerative changes of both hips. IMPRESSION: No acute osseous abnormality. Moderate degenerative changes of the bilateral hips Electronically Signed   By: Donavan Foil M.D.   On: 10/30/2016 17:56   US Venous Img Lower Unilateral Right  Result Date: 10/30/2016 CLINICAL DATA:  Acute right lower extremity pain and swelling. EXAM: Right LOWER EXTREMITY VENOUS DOPPLER ULTRASOUND TECHNIQUE: Gray-scale sonography with graded compression, as well as color Doppler and duplex ultrasound were performed to evaluate the lower extremity deep venous systems from the level of the common femoral vein and including the common femoral, femoral, profunda femoral, popliteal and calf veins including the posterior tibial, peroneal and gastrocnemius veins when visible. The superficial great saphenous vein was also interrogated. Spectral Doppler was utilized to evaluate flow at rest and with distal augmentation maneuvers in the common femoral, femoral and popliteal veins. COMPARISON:  None. FINDINGS: Contralateral Common Femoral Vein: Respiratory phasicity is normal and symmetric with the symptomatic side. No evidence of thrombus. Normal compressibility. Common Femoral Vein: No evidence of thrombus. Normal compressibility, respiratory phasicity and response to augmentation. Saphenofemoral Junction: No evidence of thrombus. Normal compressibility and flow on color Doppler imaging. Profunda  Femoral Vein: No evidence of thrombus. Normal compressibility and flow on color Doppler imaging. Femoral Vein: No evidence of thrombus. Normal compressibility, respiratory phasicity and response to augmentation. Popliteal Vein: No evidence of thrombus. Normal compressibility, respiratory phasicity and response to augmentation. Calf Veins: No evidence of thrombus. Normal compressibility and flow on color Doppler imaging. Superficial Great Saphenous Vein: No evidence of thrombus. Normal compressibility and flow on color Doppler imaging. Venous Reflux:  None. Other Findings:  None. IMPRESSION: No evidence of DVT within the right lower extremity. Electronically Signed   By: Marijo Conception, M.D.   On: 10/30/2016 14:03   Dg Femur Min 2 Views Right  Result Date: 10/30/2016 CLINICAL DATA:  Right knee and lower leg pain EXAM: RIGHT FEMUR 2 VIEWS COMPARISON:  None. FINDINGS: No significant knee effusion.  Right femoral head projects in joint. Smooth periosteal reaction and cortical bone thickening involving the midshaft of the right femur, particularly lateral cortex. Moderate degenerative changes at the knee. IMPRESSION: 1. Smooth cortical thickening involving midshaft of the right femur with mild smooth periosteal reaction, findings could be secondary to subacute/healing fracture, clinical correlation is recommended. 2. There are otherwise no acute osseous abnormality seen Electronically Signed   By: Donavan Foil M.D.   On: 10/30/2016 17:56    EKG: Orders placed or performed during the hospital encounter of 10/29/16  . EKG 12-Lead  . EKG 12-Lead  . ED EKG  . ED EKG  . EKG    IMPRESSION AND PLAN: Patient is a 76 year old female presenting with right hip pain  1.  Right hip pain etiology unclear Orthopedics recommended bone scan I'll order a bone scan of the right lower extremity for tomorrow  2. Accelerated hypertension continue therapy with losartan  3. History of PE continue Coumadin  4. GERD  continue Protonix  5. Hyperlipidemia unspecified continue therapy with simvastatin  All the records are reviewed and case discussed with ED provider. Management plans discussed with the patient, family and they are in agreement.  CODE STATUS:    Code Status Orders        Start     Ordered   10/30/16 2012  Full code  Continuous     10/30/16 2011    Code Status History    Date Active Date Inactive Code Status Order ID Comments User Context   This patient has a current code status but no historical code status.       TOTAL TIME TAKING CARE OF THIS PATIENT 55 minutes.    Dustin Flock M.D on 10/30/2016 at 8:48 PM  Between 7am to 6pm - Pager - [phone]  After 6pm go to [email or web address] - password [email or web address] [facility]  Office  [phone]  CC: Primary care physician; Einar Pheasant, MD

## 2016-10-30 NOTE — Progress Notes (Signed)
Pt. Requested nerve medicine. Dr. Posey Pronto ordered ativan 0.5mg  q6h prn

## 2016-10-30 NOTE — ED Triage Notes (Signed)
Pt to ed with c/o right knee and lower leg pain that started this am. Hx of DVT, on coumadin

## 2016-10-30 NOTE — Progress Notes (Signed)
ANTICOAGULATION CONSULT NOTE - Initial Consult  Pharmacy Consult for VKA Indication: VTE treatment  Allergies  Allergen Reactions  . Penicillins Other (See Comments)    Has patient had a PCN reaction causing immediate rash, facial/tongue/throat swelling, SOB or lightheadedness with hypotension: Unknown Has patient had a PCN reaction causing severe rash involving mucus membranes or skin necrosis: Unknown Has patient had a PCN reaction that required hospitalization: Unknown Has patient had a PCN reaction occurring within the last 10 years: Unknown If all of the above answers are "NO", then may proceed with Cephalosporin use.   Marland Kitchen Penicillin V Potassium Nausea And Vomiting    Patient Measurements: Height: 5\' 9"  (175.3 cm) Weight: 241 lb (109.3 kg) IBW/kg (Calculated) : 66.2 Heparin Dosing Weight:   Vital Signs: Temp: 98 F (36.7 C) (08/27 1236) Temp Source: Oral (08/27 1236) BP: 171/109 (08/27 1916) Pulse Rate: 76 (08/27 1916)  Labs:  Recent Labs  10/29/16 1740 10/30/16 1249  HGB 14.7 15.1  HCT 42.8 44.9  PLT 254 258  LABPROT  --  26.2*  INR  --  2.36  CREATININE 0.84 0.93  TROPONINI <0.03  --     Estimated Creatinine Clearance: 65.6 mL/min (by C-G formula based on SCr of 0.93 mg/dL).   Medical History: Past Medical History:  Diagnosis Date  . Anemia   . Atrial fibrillation ([place])   . Breast cancer ([address])    s/p lumpectomy 1992.  s/p chemo and xrt  . Diabetes mellitus ([place])   . Gastric ulcer   . GERD (gastroesophageal reflux disease)   . Hypercholesterolemia   . Hypertension   . Pulmonary emboli ([facility])     Medications:  Infusions:    Assessment: 76 yof cc leg pain PMH AF on VKA, DM, HTN, DVT and PE. Xray concerning for subacute femur fracture. No evidence of DVT on [place]. Pharmacy consulted to manage VKA.  Goal of Therapy:  INR 2-3 Monitor platelets by anticoagulation protocol: Yes   Plan:  INR therapeutic on admission. Will continue home dose 5 mg po  daily on Sundays, Tuesdays, and Thursdays; 2.5 mg po daily on Mondays, Wednesdays, Fridays, and Saturdays. Will monitor INR per protocol.  Laural Benes, Pharm.D., BCPS Clinical Pharmacist 10/30/2016,7:23 PM

## 2016-10-30 NOTE — ED Notes (Signed)
Pt reports difficulty with ambulation due to pain in right leg. Pt also states hx of left leg swelling compared to right leg. Pt denies hx of CHF.

## 2016-10-30 NOTE — ED Notes (Signed)
Patient transported to X-ray 

## 2016-10-30 NOTE — ED Notes (Signed)
Pt's daughter getting food for pt. Pt denies graham crackers and peanut butter but pt states she needs something on her stomach for pills.

## 2016-10-30 NOTE — Telephone Encounter (Signed)
Pt called and stated that she was at the Emergency room yesterday for her blood pressure. She states that it was 164/119. They wanted her to follow up with Dr. Nicki Reaper. Please advise, thank you!  Call pt @ [phone]

## 2016-10-30 NOTE — Telephone Encounter (Signed)
Needs ED fU for Hypertension please advise.

## 2016-10-30 NOTE — Telephone Encounter (Signed)
Need to know how she is doing and how blood pressure is doing now.  The only appt I have this week would be Tuesday 10/27/16 - pm - work in.  See me if questions.

## 2016-10-31 ENCOUNTER — Encounter: Payer: Self-pay | Admitting: Radiology

## 2016-10-31 ENCOUNTER — Observation Stay: Payer: Medicare Other

## 2016-10-31 DIAGNOSIS — I2699 Other pulmonary embolism without acute cor pulmonale: Secondary | ICD-10-CM | POA: Diagnosis not present

## 2016-10-31 DIAGNOSIS — M25551 Pain in right hip: Secondary | ICD-10-CM | POA: Diagnosis not present

## 2016-10-31 DIAGNOSIS — I10 Essential (primary) hypertension: Secondary | ICD-10-CM | POA: Diagnosis not present

## 2016-10-31 DIAGNOSIS — K219 Gastro-esophageal reflux disease without esophagitis: Secondary | ICD-10-CM | POA: Diagnosis not present

## 2016-10-31 LAB — BASIC METABOLIC PANEL
ANION GAP: 7 (ref 5–15)
BUN: 17 mg/dL (ref 6–20)
CALCIUM: 9.1 mg/dL (ref 8.9–10.3)
CO2: 27 mmol/L (ref 22–32)
CREATININE: 0.93 mg/dL (ref 0.44–1.00)
Chloride: 107 mmol/L (ref 101–111)
GFR calc non Af Amer: 57 mL/min — ABNORMAL LOW (ref >60)
Glucose, Bld: 100 mg/dL — ABNORMAL HIGH (ref 65–99)
Potassium: 4 mmol/L (ref 3.5–5.1)
SODIUM: 141 mmol/L (ref 135–145)

## 2016-10-31 LAB — PROTIME-INR
INR: 2.32
Prothrombin Time: 25.9 seconds — ABNORMAL HIGH (ref 11.4–15.2)

## 2016-10-31 LAB — GLUCOSE, CAPILLARY
GLUCOSE-CAPILLARY: 111 mg/dL — AB (ref 65–99)
GLUCOSE-CAPILLARY: 112 mg/dL — AB (ref 65–99)
GLUCOSE-CAPILLARY: 119 mg/dL — AB (ref 65–99)
Glucose-Capillary: 110 mg/dL — ABNORMAL HIGH (ref 65–99)

## 2016-10-31 LAB — CBC
HEMATOCRIT: 42.3 % (ref 35.0–47.0)
HEMOGLOBIN: 14.7 g/dL (ref 12.0–16.0)
MCH: 31 pg (ref 26.0–34.0)
MCHC: 34.7 g/dL (ref 32.0–36.0)
MCV: 89.4 fL (ref 80.0–100.0)
Platelets: 234 10*3/uL (ref 150–440)
RBC: 4.73 MIL/uL (ref 3.80–5.20)
RDW: 14.6 % — AB (ref 11.5–14.5)
WBC: 8.7 10*3/uL (ref 3.6–11.0)

## 2016-10-31 LAB — SEDIMENTATION RATE: Sed Rate: 5 mm/hr (ref 0–30)

## 2016-10-31 MED ORDER — WARFARIN SODIUM 3 MG PO TABS
3.0000 mg | ORAL_TABLET | Freq: Every day | ORAL | Status: DC
  Administered 2016-10-31: 3 mg via ORAL
  Filled 2016-10-31: qty 1

## 2016-10-31 MED ORDER — TECHNETIUM TC 99M MEDRONATE IV KIT
25.0000 | PACK | Freq: Once | INTRAVENOUS | Status: AC | PRN
  Administered 2016-10-31: 22.758 via INTRAVENOUS

## 2016-10-31 MED ORDER — METHOCARBAMOL 500 MG PO TABS
500.0000 mg | ORAL_TABLET | Freq: Three times a day (TID) | ORAL | Status: DC
  Administered 2016-10-31 (×2): 500 mg via ORAL
  Filled 2016-10-31 (×2): qty 1

## 2016-10-31 NOTE — Progress Notes (Signed)
[facility] [place] NAME: Anita Walker    MR#:  [id number]  DATE OF BIRTH:  16-Feb-1941  SUBJECTIVE:  CHIEF COMPLAINT:   Chief Complaint  Patient presents with  . Leg Pain   - Still complains of right distal thigh pain, more medially. No knee swelling noted. No hip pain. -Denies any trauma.  REVIEW OF SYSTEMS:  Review of Systems  Constitutional: Positive for malaise/fatigue. Negative for chills and fever.  HENT: Positive for hearing loss. Negative for congestion, ear discharge and nosebleeds.   Eyes: Negative for blurred vision, double vision and photophobia.  Respiratory: Negative for cough, shortness of breath and wheezing.   Cardiovascular: Negative for chest pain, palpitations and leg swelling.  Gastrointestinal: Negative for abdominal pain, constipation, diarrhea, nausea and vomiting.  Genitourinary: Negative for dysuria.  Musculoskeletal: Positive for joint pain and myalgias.  Neurological: Negative for dizziness, sensory change, speech change, focal weakness, seizures and headaches.  Psychiatric/Behavioral: Negative for depression.    DRUG ALLERGIES:   Allergies  Allergen Reactions  . Penicillins Other (See Comments)    Has patient had a PCN reaction causing immediate rash, facial/tongue/throat swelling, SOB or lightheadedness with hypotension: Unknown Has patient had a PCN reaction causing severe rash involving mucus membranes or skin necrosis: Unknown Has patient had a PCN reaction that required hospitalization: Unknown Has patient had a PCN reaction occurring within the last 10 years: Unknown If all of the above answers are "NO", then may proceed with Cephalosporin use.   Marland Kitchen Penicillin V Potassium Nausea And Vomiting    VITALS:  Blood pressure (!) 173/71, pulse 79, temperature 97.9 F (36.6 C), temperature source Oral, resp. rate 19, height 5\' 9"  (1.753 m), weight 109.3 kg (241 lb), SpO2 95 %.  PHYSICAL  EXAMINATION:  Physical Exam  GENERAL:  76 y.o.-year-old patient lying in the bed with no acute distress.  EYES: Pupils equal, round, reactive to light and accommodation. No scleral icterus. Extraocular muscles intact.  HEENT: Head atraumatic, normocephalic. Oropharynx and nasopharynx clear.  NECK:  Supple, no jugular venous distention. No thyroid enlargement, no tenderness.  LUNGS: Normal breath sounds bilaterally, no wheezing, rales,rhonchi or crepitation. No use of accessory muscles of respiration.  CARDIOVASCULAR: S1, S2 normal. No  rubs, or gallops. 2/6 systolic murmur is present ABDOMEN: Soft, nontender, nondistended. Bowel sounds present. No organomegaly or mass.  EXTREMITIES: No pedal edema, cyanosis, or clubbing. Bilateral ankle edema noted NEUROLOGIC: Cranial nerves II through XII are intact. Muscle strength 5/5 in all extremities, except difficulty flexing the right knee due to right thigh pain. Sensation intact. Gait not checked.  PSYCHIATRIC: The patient is alert and oriented x 3.  SKIN: No obvious rash, lesion, or ulcer.    LABORATORY PANEL:   CBC  Recent Labs Lab 10/31/16 0530  WBC 8.7  HGB 14.7  HCT 42.3  PLT 234   ------------------------------------------------------------------------------------------------------------------  Chemistries   Recent Labs Lab 10/30/16 1249 10/31/16 0530  NA 139 141  K 4.1 4.0  CL 104 107  CO2 25 27  GLUCOSE 169* 100*  BUN 18 17  CREATININE 0.93 0.93  CALCIUM 10.0 9.1  AST 22  --   ALT 19  --   ALKPHOS 39  --   BILITOT 0.7  --    ------------------------------------------------------------------------------------------------------------------  Cardiac Enzymes  Recent Labs Lab 10/29/16 1740  TROPONINI <0.03   ------------------------------------------------------------------------------------------------------------------  RADIOLOGY:  Dg Pelvis 1-2 Views  Result Date: 10/30/2016 CLINICAL DATA:  Right knee  pain EXAM: PELVIS - 1-2 VIEW COMPARISON:  None. FINDINGS: SI joints are symmetric. Multiple calcified phleboliths in the pelvis. Pubic symphysis and rami appear intact. Moderate degenerative changes of both hips. IMPRESSION: No acute osseous abnormality. Moderate degenerative changes of the bilateral hips Electronically Signed   By: Donavan Foil M.D.   On: 10/30/2016 17:56   US Venous Img Lower Unilateral Right  Result Date: 10/30/2016 CLINICAL DATA:  Acute right lower extremity pain and swelling. EXAM: Right LOWER EXTREMITY VENOUS DOPPLER ULTRASOUND TECHNIQUE: Gray-scale sonography with graded compression, as well as color Doppler and duplex ultrasound were performed to evaluate the lower extremity deep venous systems from the level of the common femoral vein and including the common femoral, femoral, profunda femoral, popliteal and calf veins including the posterior tibial, peroneal and gastrocnemius veins when visible. The superficial great saphenous vein was also interrogated. Spectral Doppler was utilized to evaluate flow at rest and with distal augmentation maneuvers in the common femoral, femoral and popliteal veins. COMPARISON:  None. FINDINGS: Contralateral Common Femoral Vein: Respiratory phasicity is normal and symmetric with the symptomatic side. No evidence of thrombus. Normal compressibility. Common Femoral Vein: No evidence of thrombus. Normal compressibility, respiratory phasicity and response to augmentation. Saphenofemoral Junction: No evidence of thrombus. Normal compressibility and flow on color Doppler imaging. Profunda Femoral Vein: No evidence of thrombus. Normal compressibility and flow on color Doppler imaging. Femoral Vein: No evidence of thrombus. Normal compressibility, respiratory phasicity and response to augmentation. Popliteal Vein: No evidence of thrombus. Normal compressibility, respiratory phasicity and response to augmentation. Calf Veins: No evidence of thrombus. Normal  compressibility and flow on color Doppler imaging. Superficial Great Saphenous Vein: No evidence of thrombus. Normal compressibility and flow on color Doppler imaging. Venous Reflux:  None. Other Findings:  None. IMPRESSION: No evidence of DVT within the right lower extremity. Electronically Signed   By: Marijo Conception, M.D.   On: 10/30/2016 14:03   Dg Femur Min 2 Views Right  Result Date: 10/30/2016 CLINICAL DATA:  Right knee and lower leg pain EXAM: RIGHT FEMUR 2 VIEWS COMPARISON:  None. FINDINGS: No significant knee effusion.  Right femoral head projects in joint. Smooth periosteal reaction and cortical bone thickening involving the midshaft of the right femur, particularly lateral cortex. Moderate degenerative changes at the knee. IMPRESSION: 1. Smooth cortical thickening involving midshaft of the right femur with mild smooth periosteal reaction, findings could be secondary to subacute/healing fracture, clinical correlation is recommended. 2. There are otherwise no acute osseous abnormality seen Electronically Signed   By: Donavan Foil M.D.   On: 10/30/2016 17:56    EKG:   Orders placed or performed during the hospital encounter of 10/29/16  . EKG 12-Lead  . EKG 12-Lead  . ED EKG  . ED EKG  . EKG    ASSESSMENT AND PLAN:   76 year old female with past medical history significant for left breast cancer status post chemotherapy in remission, atrial fibrillation and history of pulmonary embolism on Coumadin, diabetes, GERD, hypertension presents to Hospital secondary to right leg pain.  #1 right leg pain-distal right leg pain with some medial muscle spasms -X-ray without any fractures. No trauma noted. Appreciate orthopedics consult. -bone scan ordered to rule out any femoral stress fracture. -If negative, weight bearing as tolerated. Will add muscle relaxants. Continue pain medications -Physical therapy consult  #2 hypertension-continue her losartan. Blood pressure is still elevated  secondary to pain. On IV hydralazine as needed  #3 history of coronary embolism-continue Coumadin. INR  is therapeutic  #4 Hyperlipidemia-statin.  #5 DVT prophylaxis-already on Coumadin  PT consulted.   All the records are reviewed and case discussed with Care Management/Social Workerr. Management plans discussed with the patient, family and they are in agreement.  CODE STATUS: Full code  TOTAL TIME TAKING CARE OF THIS PATIENT: 37 minutes.   POSSIBLE D/C IN 2 DAYS, DEPENDING ON CLINICAL CONDITION.   Rozell Theiler M.D on 10/31/2016 at 2:48 PM  Between 7am to 6pm - Pager - [phone]  After 6pm go to [email or web address] - password [facility]  Office  [phone]  CC: Primary care physician; Einar Pheasant, MD

## 2016-10-31 NOTE — Telephone Encounter (Signed)
Left message to return call to our office.  

## 2016-10-31 NOTE — Telephone Encounter (Signed)
Per Dr. Nicki Reaper patient is in hospital and will not need app at this time.

## 2016-10-31 NOTE — Consult Note (Addendum)
Possible femur stress fracture, bone scan ordered  Patient reports somewhat abrupt onset without trauma of 5 pain. 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 She got a walker and still had trouble getting into her house with 3 steps. 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 She has had a filter placed in previously this since been removed. She reports severe pain with straight leg raising not relieved by bending the knee and severe pain with internal and external rotation of the hip with about 10 internal rotation 20 external PAIN. The knee did not appear to be tender she had minimal tenderness midthigh pain with logrolling  X-rays as noted above showed appears to be a healed stress fracture in the midshaft with some bowing to the femur and significant central hip osteoarthritis  Clinical impression is possible stress fracture with impending complete fracture versus femoral neck stress fracture or hip arthritis giving her this pain.  Plan is for bone scan I think this should give [place] an idea of the etiology of her pain. If it is the midshaft of the recommend IM rodding unless there is a suggestion of primary tumor. History of breast cancer and there is also a possibility of metastatic disease. Her Coumadin has been stopped in case she requires surgical intervention but no vitamin K has been given in case surgery is not required.

## 2016-10-31 NOTE — Care Management Obs Status (Signed)
[facility] NOTIFICATION   Patient Details  Name: Anita Walker [place] MRN: [id number] Date of Birth: [birth year]   Medicare Observation Status Notification Given:  Yes    Jolly Mango, RN 10/31/2016, 8:51 AM

## 2016-10-31 NOTE — Telephone Encounter (Signed)
Noted.  She is in the hospital.  Will not need appt today since in the hospital.  Let us know if needs anything.

## 2016-10-31 NOTE — Progress Notes (Signed)
ANTICOAGULATION CONSULT NOTE - Initial Consult  Pharmacy Consult for VKA Indication: VTE treatment  Allergies  Allergen Reactions  . Penicillins Other (See Comments)    Has patient had a PCN reaction causing immediate rash, facial/tongue/throat swelling, SOB or lightheadedness with hypotension: Unknown Has patient had a PCN reaction causing severe rash involving mucus membranes or skin necrosis: Unknown Has patient had a PCN reaction that required hospitalization: Unknown Has patient had a PCN reaction occurring within the last 10 years: Unknown If all of the above answers are "NO", then may proceed with Cephalosporin use.   Marland Kitchen Penicillin V Potassium Nausea And Vomiting    Patient Measurements: Height: 5\' 9"  (175.3 cm) Weight: 241 lb (109.3 kg) IBW/kg (Calculated) : 66.2 Heparin Dosing Weight:   Vital Signs: Temp: 97.9 F (36.6 C) (08/28 0749) Temp Source: Oral (08/28 0749) BP: 173/71 (08/28 0749) Pulse Rate: 79 (08/28 0749)  Labs:  Recent Labs  10/29/16 1740 10/30/16 1249 10/30/16 2253 10/31/16 0530  HGB 14.7 15.1  --  14.7  HCT 42.8 44.9  --  42.3  PLT 254 258  --  234  LABPROT  --  26.2* 26.6* 25.9*  INR  --  2.36 2.40 2.32  CREATININE 0.84 0.93  --  0.93  TROPONINI <0.03  --   --   --     Estimated Creatinine Clearance: 65.6 mL/min (by C-G formula based on SCr of 0.93 mg/dL).   Medical History: Past Medical History:  Diagnosis Date  . Anemia   . Atrial fibrillation ([place])   . Breast cancer ([place])    s/p lumpectomy 1992.  s/p chemo and xrt  . Diabetes mellitus ([place])   . Gastric ulcer   . GERD (gastroesophageal reflux disease)   . Hypercholesterolemia   . Hypertension   . Pulmonary emboli ([facility])     Medications:  Infusions:  . sodium chloride      Assessment: 76 yof cc leg pain PMH AF on VKA, DM, HTN, DVT and PE. Xray concerning for subacute femur fracture. No evidence of DVT on [place]. Pharmacy consulted to manage VKA.  Goal of Therapy:  INR  2-3 Monitor platelets by anticoagulation protocol: Yes   Plan:  INR therapeutic on admission. Will continue home dose 5 mg po daily on Sundays, Tuesdays, and Thursdays; 2.5 mg po daily on Mondays, Wednesdays, Fridays, and Saturdays. Will monitor INR per protocol.  8/28 INR 2.32, changing to warfarin 3mg  daily based on home regimen average daily dose of 3.57mg . Will monitor INR daily  Donna Christen Nkosi Cortright, Pharm.D., BCPS Clinical Pharmacist 10/31/2016,8:04 AM

## 2016-10-31 NOTE — Telephone Encounter (Signed)
Per Dr. Nicki Reaper call patient and let her know that we were going to be able to be seen.

## 2016-10-31 NOTE — Progress Notes (Signed)
Bone scan with very little uptake at midfemur, ESR and CRP ordered.

## 2016-11-01 DIAGNOSIS — I2699 Other pulmonary embolism without acute cor pulmonale: Secondary | ICD-10-CM | POA: Diagnosis not present

## 2016-11-01 DIAGNOSIS — I10 Essential (primary) hypertension: Secondary | ICD-10-CM | POA: Diagnosis not present

## 2016-11-01 DIAGNOSIS — K219 Gastro-esophageal reflux disease without esophagitis: Secondary | ICD-10-CM | POA: Diagnosis not present

## 2016-11-01 DIAGNOSIS — M25551 Pain in right hip: Secondary | ICD-10-CM | POA: Diagnosis not present

## 2016-11-01 LAB — PROTIME-INR
INR: 2.3
Prothrombin Time: 25.1 seconds — ABNORMAL HIGH (ref 11.4–15.2)

## 2016-11-01 LAB — GLUCOSE, CAPILLARY
GLUCOSE-CAPILLARY: 108 mg/dL — AB (ref 65–99)
GLUCOSE-CAPILLARY: 117 mg/dL — AB (ref 65–99)
GLUCOSE-CAPILLARY: 116 mg/dL — AB (ref 65–99)
Glucose-Capillary: 102 mg/dL — ABNORMAL HIGH (ref 65–99)

## 2016-11-01 LAB — C-REACTIVE PROTEIN: CRP: <0.8 mg/dL (ref <1.0)

## 2016-11-01 MED ORDER — METHOCARBAMOL 500 MG PO TABS
500.0000 mg | ORAL_TABLET | Freq: Three times a day (TID) | ORAL | Status: DC
  Administered 2016-11-01 – 2016-11-02 (×4): 500 mg via ORAL
  Filled 2016-11-01 (×4): qty 1

## 2016-11-01 MED ORDER — WARFARIN SODIUM 2.5 MG PO TABS
2.5000 mg | ORAL_TABLET | ORAL | Status: DC
  Administered 2016-11-01: 2.5 mg via ORAL
  Filled 2016-11-01: qty 1

## 2016-11-01 MED ORDER — SENNOSIDES-DOCUSATE SODIUM 8.6-50 MG PO TABS
1.0000 | ORAL_TABLET | Freq: Two times a day (BID) | ORAL | Status: DC
  Administered 2016-11-01 – 2016-11-02 (×2): 1 via ORAL
  Filled 2016-11-01 (×3): qty 1

## 2016-11-01 MED ORDER — WARFARIN SODIUM 5 MG PO TABS
5.0000 mg | ORAL_TABLET | ORAL | Status: DC
  Filled 2016-11-01: qty 1

## 2016-11-01 MED ORDER — LOSARTAN POTASSIUM 50 MG PO TABS
100.0000 mg | ORAL_TABLET | Freq: Every day | ORAL | Status: DC
  Administered 2016-11-01 – 2016-11-02 (×2): 100 mg via ORAL
  Filled 2016-11-01 (×2): qty 2

## 2016-11-01 MED ORDER — POLYETHYLENE GLYCOL 3350 17 G PO PACK
17.0000 g | PACK | Freq: Every day | ORAL | Status: DC | PRN
  Administered 2016-11-02: 17 g via ORAL
  Filled 2016-11-01: qty 1

## 2016-11-01 NOTE — Progress Notes (Signed)
ANTICOAGULATION CONSULT NOTE - Initial Consult  Pharmacy Consult for VKA Indication: VTE treatment  Allergies  Allergen Reactions  . Penicillins Other (See Comments)    Has patient had a PCN reaction causing immediate rash, facial/tongue/throat swelling, SOB or lightheadedness with hypotension: Unknown Has patient had a PCN reaction causing severe rash involving mucus membranes or skin necrosis: Unknown Has patient had a PCN reaction that required hospitalization: Unknown Has patient had a PCN reaction occurring within the last 10 years: Unknown If all of the above answers are "NO", then may proceed with Cephalosporin use.   Marland Kitchen Penicillin V Potassium Nausea And Vomiting    Patient Measurements: Height: 5\' 9"  (175.3 cm) Weight: 241 lb (109.3 kg) IBW/kg (Calculated) : 66.2 Heparin Dosing Weight:   Vital Signs: Temp: 98.4 F (36.9 C) (08/29 0942) Temp Source: Oral (08/29 0942) BP: 155/73 (08/29 0942) Pulse Rate: 87 (08/29 0942)  Labs:  Recent Labs  10/29/16 1740  10/30/16 1249 10/30/16 2253 10/31/16 0530 11/01/16 0324  HGB 14.7  --  15.1  --  14.7  --   HCT 42.8  --  44.9  --  42.3  --   PLT 254  --  258  --  234  --   LABPROT  --   < > 26.2* 26.6* 25.9* 25.1*  INR  --   < > 2.36 2.40 2.32 2.30  CREATININE 0.84  --  0.93  --  0.93  --   TROPONINI <0.03  --   --   --   --   --   < > = values in this interval not displayed.  Estimated Creatinine Clearance: 65.6 mL/min (by C-G formula based on SCr of 0.93 mg/dL).   Medical History: Past Medical History:  Diagnosis Date  . Anemia   . Atrial fibrillation ([place])   . Breast cancer ([place])    s/p lumpectomy 1992.  s/p chemo and xrt  . Diabetes mellitus ([place])   . Gastric ulcer   . GERD (gastroesophageal reflux disease)   . Hypercholesterolemia   . Hypertension   . Pulmonary emboli ([facility])     Medications:  Infusions:  . sodium chloride      Assessment: 76 yof cc leg pain PMH AF on VKA, DM, HTN, DVT and PE. Xray  concerning for subacute femur fracture. No evidence of DVT on [place]. Pharmacy consulted to manage VKA.  Goal of Therapy:  INR 2-3 Monitor platelets by anticoagulation protocol: Yes   Plan:  INR therapeutic. Will continue home dose 5 mg po daily on Sundays, Tuesdays, and Thursdays; 2.5 mg po daily on Mondays, Wednesdays, Fridays, and Saturdays. Will monitor INR per protocol. INR very stable, not on any antibiotics. Will monitor qod   Ramond Dial, Pharm.D., BCPS Clinical Pharmacist 11/01/2016,10:35 AM

## 2016-11-01 NOTE — Progress Notes (Signed)
Pt is alert and oriented. Very hard of hearing. No complaints of pain this shift. Using bedpan to void. Pt able to sleep in between care.

## 2016-11-01 NOTE — Progress Notes (Signed)
[place] [facility] [place] NAME: Anita Walker    MR#:  [id number]  DATE OF BIRTH:  09-24-1940  SUBJECTIVE:  CHIEF COMPLAINT:   Chief Complaint  Patient presents with  . Leg Pain   - pain is improving, no hip pain, Pain is more around the right knee, in the medial thigh muscles. -Physical therapy to work with her today  REVIEW OF SYSTEMS:  Review of Systems  Constitutional: Positive for malaise/fatigue. Negative for chills and fever.  HENT: Positive for hearing loss. Negative for congestion, ear discharge and nosebleeds.   Eyes: Negative for blurred vision, double vision and photophobia.  Respiratory: Negative for cough, shortness of breath and wheezing.   Cardiovascular: Negative for chest pain, palpitations and leg swelling.  Gastrointestinal: Negative for abdominal pain, constipation, diarrhea, nausea and vomiting.  Genitourinary: Negative for dysuria.  Musculoskeletal: Positive for joint pain and myalgias.  Neurological: Negative for dizziness, sensory change, speech change, focal weakness, seizures and headaches.  Psychiatric/Behavioral: Negative for depression.    DRUG ALLERGIES:   Allergies  Allergen Reactions  . Penicillins Other (See Comments)    Has patient had a PCN reaction causing immediate rash, facial/tongue/throat swelling, SOB or lightheadedness with hypotension: Unknown Has patient had a PCN reaction causing severe rash involving mucus membranes or skin necrosis: Unknown Has patient had a PCN reaction that required hospitalization: Unknown Has patient had a PCN reaction occurring within the last 10 years: Unknown If all of the above answers are "NO", then may proceed with Cephalosporin use.   Marland Kitchen Penicillin V Potassium Nausea And Vomiting    VITALS:  Blood pressure (!) 158/74, pulse 80, temperature 97.8 F (36.6 C), temperature source Oral, resp. rate 18, height 5\' 9"  (1.753 m), weight 109.3 kg (241 lb), SpO2 96  %.  PHYSICAL EXAMINATION:  Physical Exam  GENERAL:  76 y.o.-year-old patient lying in the bed with no acute distress.  EYES: Pupils equal, round, reactive to light and accommodation. No scleral icterus. Extraocular muscles intact.  HEENT: Head atraumatic, normocephalic. Oropharynx and nasopharynx clear.  NECK:  Supple, no jugular venous distention. No thyroid enlargement, no tenderness.  LUNGS: Normal breath sounds bilaterally, no wheezing, rales,rhonchi or crepitation. No use of accessory muscles of respiration.  CARDIOVASCULAR: S1, S2 normal. No  rubs, or gallops. 2/6 systolic murmur is present ABDOMEN: Soft, nontender, nondistended. Bowel sounds present. No organomegaly or mass.  EXTREMITIES: No pedal edema, cyanosis, or clubbing. Bilateral ankle edema noted NEUROLOGIC: Cranial nerves II through XII are intact. Muscle strength 5/5 in all extremities, except difficulty flexing the right knee due to right distal medial thigh pain. Sensation intact. Gait not checked.  PSYCHIATRIC: The patient is alert and oriented x 3.  SKIN: No obvious rash, lesion, or ulcer.    LABORATORY PANEL:   CBC  Recent Labs Lab 10/31/16 0530  WBC 8.7  HGB 14.7  HCT 42.3  PLT 234   ------------------------------------------------------------------------------------------------------------------  Chemistries   Recent Labs Lab 10/30/16 1249 10/31/16 0530  NA 139 141  K 4.1 4.0  CL 104 107  CO2 25 27  GLUCOSE 169* 100*  BUN 18 17  CREATININE 0.93 0.93  CALCIUM 10.0 9.1  AST 22  --   ALT 19  --   ALKPHOS 39  --   BILITOT 0.7  --    ------------------------------------------------------------------------------------------------------------------  Cardiac Enzymes  Recent Labs Lab 10/29/16 1740  TROPONINI <0.03   ------------------------------------------------------------------------------------------------------------------  RADIOLOGY:  Dg Pelvis 1-2 Views  Result Date:  10/30/2016 CLINICAL DATA:  Right knee pain EXAM: PELVIS - 1-2 VIEW COMPARISON:  None. FINDINGS: SI joints are symmetric. Multiple calcified phleboliths in the pelvis. Pubic symphysis and rami appear intact. Moderate degenerative changes of both hips. IMPRESSION: No acute osseous abnormality. Moderate degenerative changes of the bilateral hips Electronically Signed   By: Donavan Foil M.D.   On: 10/30/2016 17:56   Nm Bone Scan Limited  Result Date: 10/31/2016 CLINICAL DATA:  Right hip pain for 5 days, progressively worsening and worse with weight-bearing. No known injury. History of breast carcinoma. EXAM: NUCLEAR MEDICINE BONE LIMITED TECHNIQUE: After intravenous administration of radiopharmaceutical, delayed planar images were obtained in multiple projections through the limited area of interest. RADIOPHARMACEUTICALS:  16.109 millicuries of UE45W MDP, intravenously. COMPARISON:  None. FINDINGS: There is no abnormal uptake involving the pelvis or either hip. There is a small focus of uptake along the cortex of the midshaft of the right femur. This corresponds to a smooth cortical bone protrusion on CT, which is likely a small osteochondroma. There are no other areas of abnormal radiotracer localization. IMPRESSION: 1. No fracture. 2. No osteolytic bone lesion to suggest metastatic disease. 3. Smooth cortical bone protrusion from the lateral mid shaft of the right femur. This is likely a small osteochondroma. There is no associated soft tissue abnormality. This has no aggressive features. No other abnormalities. This is felt unlikely to be clinically significant. 4. Hip joints are incompletely imaged on the CT images. The portions visualized suggest degenerative/arthropathic changes. This could be the source of the patient's hip pain. Recommend follow-up hip radiographs for further assessment. Electronically Signed   By: Lajean Manes M.D.   On: 10/31/2016 15:30   US Venous Img Lower Unilateral Right  Result  Date: 10/30/2016 CLINICAL DATA:  Acute right lower extremity pain and swelling. EXAM: Right LOWER EXTREMITY VENOUS DOPPLER ULTRASOUND TECHNIQUE: Gray-scale sonography with graded compression, as well as color Doppler and duplex ultrasound were performed to evaluate the lower extremity deep venous systems from the level of the common femoral vein and including the common femoral, femoral, profunda femoral, popliteal and calf veins including the posterior tibial, peroneal and gastrocnemius veins when visible. The superficial great saphenous vein was also interrogated. Spectral Doppler was utilized to evaluate flow at rest and with distal augmentation maneuvers in the common femoral, femoral and popliteal veins. COMPARISON:  None. FINDINGS: Contralateral Common Femoral Vein: Respiratory phasicity is normal and symmetric with the symptomatic side. No evidence of thrombus. Normal compressibility. Common Femoral Vein: No evidence of thrombus. Normal compressibility, respiratory phasicity and response to augmentation. Saphenofemoral Junction: No evidence of thrombus. Normal compressibility and flow on color Doppler imaging. Profunda Femoral Vein: No evidence of thrombus. Normal compressibility and flow on color Doppler imaging. Femoral Vein: No evidence of thrombus. Normal compressibility, respiratory phasicity and response to augmentation. Popliteal Vein: No evidence of thrombus. Normal compressibility, respiratory phasicity and response to augmentation. Calf Veins: No evidence of thrombus. Normal compressibility and flow on color Doppler imaging. Superficial Great Saphenous Vein: No evidence of thrombus. Normal compressibility and flow on color Doppler imaging. Venous Reflux:  None. Other Findings:  None. IMPRESSION: No evidence of DVT within the right lower extremity. Electronically Signed   By: Marijo Conception, M.D.   On: 10/30/2016 14:03   Dg Femur Min 2 Views Right  Result Date: 10/30/2016 CLINICAL DATA:  Right  knee and lower leg pain EXAM: RIGHT FEMUR 2 VIEWS COMPARISON:  None. FINDINGS: No significant knee effusion.  Right femoral head projects in joint. Smooth periosteal reaction and cortical bone thickening involving the midshaft of the right femur, particularly lateral cortex. Moderate degenerative changes at the knee. IMPRESSION: 1. Smooth cortical thickening involving midshaft of the right femur with mild smooth periosteal reaction, findings could be secondary to subacute/healing fracture, clinical correlation is recommended. 2. There are otherwise no acute osseous abnormality seen Electronically Signed   By: Donavan Foil M.D.   On: 10/30/2016 17:56    EKG:   Orders placed or performed during the hospital encounter of 10/29/16  . EKG 12-Lead  . EKG 12-Lead  . ED EKG  . ED EKG  . EKG    ASSESSMENT AND PLAN:   76 year old female with past medical history significant for left breast cancer status post chemotherapy in remission, atrial fibrillation and history of pulmonary embolism on Coumadin, diabetes, GERD, hypertension presents to Hospital secondary to right leg pain.  #1 right leg pain-distal right leg pain with some medial muscle spasms -X-ray without any fractures. No trauma noted. Appreciate orthopedics consult. -bone scanWith no fracture or osteolytic lesion. Bilateral hip arthritis is present. -Add muscle relaxants. Continue pain medications -Physical therapy consulted-patient is hoping to go to rehabilitation  #2 hypertension-continue her losartan. Adjust the dose if blood pressure is still elevated today  #3 history of coronary embolism-continue Coumadin. INR is therapeutic  #4 Hyperlipidemia-statin.  #5 DVT prophylaxis-already on Coumadin  PT consulted.   All the records are reviewed and case discussed with Care Management/Social Workerr. Management plans discussed with the patient, family and they are in agreement.  CODE STATUS: Full code  TOTAL TIME TAKING CARE OF  THIS PATIENT: 36 minutes.   POSSIBLE D/C IN 1-2 DAYS, DEPENDING ON CLINICAL CONDITION.   Madalyn Legner M.D on 11/01/2016 at 9:01 AM  Between 7am to 6pm - Pager - [phone]  After 6pm go to [email or web address] - password [facility]  Office  [phone]  CC: Primary care physician; Einar Pheasant, MD

## 2016-11-01 NOTE — Evaluation (Signed)
Physical Therapy Evaluation Patient Details Name: Anita Walker MRN: [id number] DOB: [birth year] Today's Date: 11/01/2016   History of Present Illness  Pt admitted for complaints of R hip pain. Pt with history of anemia, Afib, DM, GERD, breast cancer post chemo in remission, and HTN. Per imaging, appears to suggest arthritis with no fracture noted. DVT imaging negative at this time.   Clinical Impression  Pt is a pleasant 76 year old female who was admitted for R hip pain. Pt performs bed mobility with min assist, transfers with cga, and ambulation with supervision and RW. Pt educated on correct technique using RW and reports no pain with mobility. Pt demonstrates deficits with strength/endurance. Would benefit from skilled PT to address above deficits and promote optimal return to PLOF. Recommend transition to [place] upon discharge from acute hospitalization.       Follow Up Recommendations Home health PT;Supervision - Intermittent    Equipment Recommendations  Rolling walker with 5" wheels    Recommendations for Other Services       Precautions / Restrictions Precautions Precautions: Fall Restrictions Weight Bearing Restrictions: No      Mobility  Bed Mobility Overal bed mobility: Needs Assistance Bed Mobility: Supine to Sit     Supine to sit: Min assist     General bed mobility comments: assist required for R LE. Pt able to initiate movement and does majority of transfer without assist. Needs slight assistance for return back to bed as well as scooting up towards [place]. Pt reports no pain once sitting at EOB  Transfers Overall transfer level: Needs assistance Equipment used: Rolling walker (2 wheeled) Transfers: Sit to/from Stand Sit to Stand: Min guard         General transfer comment: safe technique with transfer. RW used and pt able to stand upright with equal WBing through B LEs.  Ambulation/Gait Ambulation/Gait assistance: Supervision Ambulation  Distance (Feet): 40 Feet Assistive device: Rolling walker (2 wheeled) Gait Pattern/deviations: Step-to pattern     General Gait Details: ambulated in room as she refused to ambulate in hallway at this time. Reports she feels she could go further next session. Slow gait speed noted, however safe technique with occasional cue for sequencing with RW. No pain reports during ambulation.  Stairs            Wheelchair Mobility    Modified Rankin (Stroke Patients Only)       Balance Overall balance assessment: Needs assistance Sitting-balance support: Feet supported Sitting balance-Leahy Scale: Normal     Standing balance support: Bilateral upper extremity supported Standing balance-Leahy Scale: Normal                               Pertinent Vitals/Pain Pain Assessment: Faces Faces Pain Scale: Hurts a little bit Pain Location: R thigh Pain Descriptors / Indicators: Aching Pain Intervention(s): Limited activity within patient's tolerance;Repositioned    Home Living Family/patient expects to be discharged to:: Private residence Living Arrangements: Alone Available Help at Discharge:  (reports she could make arrangments for care post discharge) Type of Home: House Home Access: Stairs to enter Entrance Stairs-Rails: Left Entrance Stairs-Number of Steps: 2 Home Layout: One level (has multiple areas that have 1 step with L rail to enter) Home Equipment: Walker - standard;Cane - single point      Prior Function Level of Independence: Independent         Comments: was previously independent, occasional use of  SPC. 2 days prior to admission, pt unable to walk needing to use SW for mobility     Hand Dominance   Dominant Hand: Right    Extremity/Trunk Assessment   Upper Extremity Assessment Upper Extremity Assessment: Overall WFL for tasks assessed    Lower Extremity Assessment Lower Extremity Assessment: Generalized weakness (R LE grossly 4/5; L LE  grossly 5/5)       Communication   Communication: No difficulties  Cognition Arousal/Alertness: Awake/alert Behavior During Therapy: WFL for tasks assessed/performed Overall Cognitive Status: Within Functional Limits for tasks assessed                                        General Comments      Exercises Other Exercises Other Exercises: supine ther-ex performed on R LE including ankle pumps, SLRs, and hip abd/add. All ther-ex performed with supervision and cues for correct technique x 10 reps   Assessment/Plan    PT Assessment Patient needs continued PT services  PT Problem List Decreased strength;Decreased mobility;Decreased knowledge of use of DME       PT Treatment Interventions Gait training;DME instruction;Therapeutic exercise    PT Goals (Current goals can be found in the Care Plan section)  Acute Rehab PT Goals Patient Stated Goal: to be able to walk without RW PT Goal Formulation: With patient Time For Goal Achievement: 11/15/16 Potential to Achieve Goals: Good    Frequency Min 2X/week   Barriers to discharge Decreased caregiver support recommend assistance in home for short time period    Co-evaluation               AM-PAC PT "6 Clicks" Daily Activity  Outcome Measure Difficulty turning over in bed (including adjusting bedclothes, sheets and blankets)?: A Little Difficulty moving from lying on back to sitting on the side of the bed? : Unable Difficulty sitting down on and standing up from a chair with arms (e.g., wheelchair, bedside commode, etc,.)?: Unable Help needed moving to and from a bed to chair (including a wheelchair)?: A Little Help needed walking in hospital room?: A Little Help needed climbing 3-5 steps with a railing? : A Little 6 Click Score: 14    End of Session Equipment Utilized During Treatment: Gait belt Activity Tolerance: Patient tolerated treatment well Patient left: in bed;with bed alarm set (pt refused to  sit in recliner) Nurse Communication: Mobility status PT Visit Diagnosis: Muscle weakness (generalized) (M62.81);Difficulty in walking, not elsewhere classified (R26.2)    Time: 3545-6256 PT Time Calculation (min) (ACUTE ONLY): 25 min   Charges:   PT Evaluation $PT Eval Low Complexity: 1 Low PT Treatments $Therapeutic Exercise: 8-22 mins   PT G Codes:   PT G-Codes **NOT FOR INPATIENT CLASS** Functional Assessment Tool Used: AM-PAC 6 Clicks Basic Mobility Functional Limitation: Mobility: Walking and moving around Mobility: Walking and Moving Around Current Status (L8937): At least 40 percent but less than 60 percent impaired, limited or restricted Mobility: Walking and Moving Around Goal Status [phone]): At least 20 percent but less than 40 percent impaired, limited or restricted    Anita Walker, PT, DPT [phone]   Boruch Manuele 11/01/2016, 12:59 PM

## 2016-11-01 NOTE — Care Management Note (Signed)
Case Management Note  Patient Details  Name: Anita Walker [place] MRN: [id number] Date of Birth: 1940/12/23  Subjective/Objective:  Met with patient at bedside to discuss discharge planning. Patient wants to go to rehab. Explained observation status and that medicare wouldn't pay. Also PT recommending HHPT. No agency preference. [facility]erral to [facility] for [facility]. Will need a walker. Ordered from [facility]. Patient lives at home alone. She states she was driving prior to admission.                    Action/Plan: [facility] for HHPT. [facility] for walker. It is anticipated patient will discharge tomorrow.   Expected Discharge Date:  11/01/16               Expected Discharge Plan:  [place]  In-House Referral:     Discharge planning Services  CM Consult  Post Acute Care Choice:  Durable Medical Equipment, Home Health Choice offered to:  Patient  DME Arranged:  Walker rolling DME Agency:  [place]:  PT [facility] Agency:  [place]  Status of Service:  In process, will continue to follow  If discussed at Long Length of Stay Meetings, dates discussed:    Additional Comments:  Jolly Mango, RN 11/01/2016, 3:18 PM

## 2016-11-02 ENCOUNTER — Emergency Department: Payer: Medicare Other

## 2016-11-02 ENCOUNTER — Emergency Department
Admission: EM | Admit: 2016-11-02 | Discharge: 2016-11-02 | Disposition: A | Discharge status: Home / Self Care | Payer: Medicare Other | Source: Home / Self Care | Attending: Emergency Medicine | Admitting: Emergency Medicine

## 2016-11-02 ENCOUNTER — Other Ambulatory Visit: Payer: Self-pay

## 2016-11-02 ENCOUNTER — Encounter: Payer: Self-pay | Admitting: *Deleted

## 2016-11-02 DIAGNOSIS — E119 Type 2 diabetes mellitus without complications: Secondary | ICD-10-CM

## 2016-11-02 DIAGNOSIS — I2699 Other pulmonary embolism without acute cor pulmonale: Secondary | ICD-10-CM | POA: Diagnosis not present

## 2016-11-02 DIAGNOSIS — I951 Orthostatic hypotension: Secondary | ICD-10-CM

## 2016-11-02 DIAGNOSIS — M6281 Muscle weakness (generalized): Secondary | ICD-10-CM | POA: Diagnosis not present

## 2016-11-02 DIAGNOSIS — I10 Essential (primary) hypertension: Secondary | ICD-10-CM | POA: Insufficient documentation

## 2016-11-02 DIAGNOSIS — R55 Syncope and collapse: Secondary | ICD-10-CM | POA: Insufficient documentation

## 2016-11-02 DIAGNOSIS — N289 Disorder of kidney and ureter, unspecified: Secondary | ICD-10-CM | POA: Insufficient documentation

## 2016-11-02 DIAGNOSIS — K219 Gastro-esophageal reflux disease without esophagitis: Secondary | ICD-10-CM | POA: Diagnosis not present

## 2016-11-02 DIAGNOSIS — Z86711 Personal history of pulmonary embolism: Secondary | ICD-10-CM

## 2016-11-02 DIAGNOSIS — Z7901 Long term (current) use of anticoagulants: Secondary | ICD-10-CM

## 2016-11-02 DIAGNOSIS — Z853 Personal history of malignant neoplasm of breast: Secondary | ICD-10-CM | POA: Insufficient documentation

## 2016-11-02 DIAGNOSIS — R5383 Other fatigue: Secondary | ICD-10-CM | POA: Diagnosis not present

## 2016-11-02 DIAGNOSIS — R918 Other nonspecific abnormal finding of lung field: Secondary | ICD-10-CM | POA: Diagnosis not present

## 2016-11-02 DIAGNOSIS — M25551 Pain in right hip: Secondary | ICD-10-CM | POA: Diagnosis not present

## 2016-11-02 LAB — CBC
HCT: 45.9 % (ref 35.0–47.0)
HEMATOCRIT: 47.9 % — AB (ref 35.0–47.0)
HEMOGLOBIN: 16.2 g/dL — AB (ref 12.0–16.0)
Hemoglobin: 15.4 g/dL (ref 12.0–16.0)
MCH: 30.2 pg (ref 26.0–34.0)
MCH: 29.9 pg (ref 26.0–34.0)
MCHC: 33.5 g/dL (ref 32.0–36.0)
MCHC: 33.8 g/dL (ref 32.0–36.0)
MCV: 90.1 fL (ref 80.0–100.0)
MCV: 88.4 fL (ref 80.0–100.0)
PLATELETS: 255 10*3/uL (ref 150–440)
Platelets: 268 10*3/uL (ref 150–440)
RBC: 5.1 MIL/uL (ref 3.80–5.20)
RBC: 5.42 MIL/uL — ABNORMAL HIGH (ref 3.80–5.20)
RDW: 14.9 % — AB (ref 11.5–14.5)
RDW: 15 % — ABNORMAL HIGH (ref 11.5–14.5)
WBC: 8.4 10*3/uL (ref 3.6–11.0)
WBC: 12.5 10*3/uL — AB (ref 3.6–11.0)

## 2016-11-02 LAB — BASIC METABOLIC PANEL
ANION GAP: 13 (ref 5–15)
Anion gap: 7 (ref 5–15)
BUN: 23 mg/dL — ABNORMAL HIGH (ref 6–20)
BUN: 34 mg/dL — ABNORMAL HIGH (ref 6–20)
CALCIUM: 9.3 mg/dL (ref 8.9–10.3)
CHLORIDE: 100 mmol/L — AB (ref 101–111)
CO2: 26 mmol/L (ref 22–32)
CO2: 22 mmol/L (ref 22–32)
CREATININE: 0.84 mg/dL (ref 0.44–1.00)
Calcium: 10.1 mg/dL (ref 8.9–10.3)
Chloride: 105 mmol/L (ref 101–111)
Creatinine, Ser: 1.42 mg/dL — ABNORMAL HIGH (ref 0.44–1.00)
GFR calc Af Amer: >60 mL/min (ref >60)
GFR calc non Af Amer: 34 mL/min — ABNORMAL LOW (ref >60)
GFR, EST AFRICAN AMERICAN: 40 mL/min — AB (ref >60)
GLUCOSE: 118 mg/dL — AB (ref 65–99)
Glucose, Bld: 174 mg/dL — ABNORMAL HIGH (ref 65–99)
Potassium: 4.2 mmol/L (ref 3.5–5.1)
Potassium: 4.4 mmol/L (ref 3.5–5.1)
Sodium: 138 mmol/L (ref 135–145)
Sodium: 135 mmol/L (ref 135–145)

## 2016-11-02 LAB — URINALYSIS, COMPLETE (UACMP) WITH MICROSCOPIC
BILIRUBIN URINE: NEGATIVE
Bacteria, UA: NONE SEEN
GLUCOSE, UA: NEGATIVE mg/dL
HGB URINE DIPSTICK: NEGATIVE
KETONES UR: 5 mg/dL — AB
LEUKOCYTES UA: NEGATIVE
NITRITE: NEGATIVE
PH: 6 (ref 5.0–8.0)
Protein, ur: NEGATIVE mg/dL
SQUAMOUS EPITHELIAL / LPF: NONE SEEN
Specific Gravity, Urine: 1.029 (ref 1.005–1.030)
WBC, UA: NONE SEEN WBC/hpf (ref 0–5)

## 2016-11-02 LAB — APTT: APTT: 43 s — AB (ref 24–36)

## 2016-11-02 LAB — PROTIME-INR
INR: 2.36
Prothrombin Time: 25.6 seconds — ABNORMAL HIGH (ref 11.4–15.2)

## 2016-11-02 LAB — GLUCOSE, CAPILLARY
GLUCOSE-CAPILLARY: 129 mg/dL — AB (ref 65–99)
GLUCOSE-CAPILLARY: 130 mg/dL — AB (ref 65–99)
GLUCOSE-CAPILLARY: 158 mg/dL — AB (ref 65–99)

## 2016-11-02 LAB — TROPONIN I: Troponin I: <0.03 ng/mL (ref <0.03)

## 2016-11-02 MED ORDER — SODIUM CHLORIDE 0.9 % IV BOLUS (SEPSIS)
1000.0000 mL | Freq: Once | INTRAVENOUS | Status: AC
  Administered 2016-11-02: 1000 mL via INTRAVENOUS

## 2016-11-02 MED ORDER — HYDROCODONE-ACETAMINOPHEN 5-325 MG PO TABS: 1.0000 | ORAL_TABLET | Freq: Four times a day (QID) | ORAL | 0 refills | Status: DC | PRN

## 2016-11-02 MED ORDER — SENNOSIDES-DOCUSATE SODIUM 8.6-50 MG PO TABS: 1.0000 | ORAL_TABLET | Freq: Two times a day (BID) | ORAL | 0 refills | Status: DC

## 2016-11-02 MED ORDER — BISACODYL 10 MG RE SUPP
10.0000 mg | Freq: Every day | RECTAL | Status: DC
  Administered 2016-11-02: 10 mg via RECTAL
  Filled 2016-11-02: qty 1

## 2016-11-02 MED ORDER — BISACODYL 5 MG PO TBEC
10.0000 mg | DELAYED_RELEASE_TABLET | Freq: Once | ORAL | Status: AC
  Administered 2016-11-02: 10 mg via ORAL
  Filled 2016-11-02: qty 2

## 2016-11-02 MED ORDER — IOPAMIDOL (ISOVUE-370) INJECTION 76%
75.0000 mL | Freq: Once | INTRAVENOUS | Status: AC | PRN
  Administered 2016-11-02: 75 mL via INTRAVENOUS

## 2016-11-02 MED ORDER — SODIUM CHLORIDE 0.9 % IV BOLUS (SEPSIS)
500.0000 mL | Freq: Once | INTRAVENOUS | Status: AC
  Administered 2016-11-02: 500 mL via INTRAVENOUS

## 2016-11-02 MED ORDER — METHOCARBAMOL 500 MG PO TABS: 500.0000 mg | ORAL_TABLET | Freq: Three times a day (TID) | ORAL | 0 refills | Status: DC | PRN

## 2016-11-02 MED ORDER — POLYETHYLENE GLYCOL 3350 17 G PO PACK: 17.0000 g | PACK | Freq: Every day | ORAL | 0 refills | Status: DC | PRN

## 2016-11-02 NOTE — ED Notes (Signed)
Pt assisted to room commode to void, qs; stand by assistance only

## 2016-11-02 NOTE — Discharge Instructions (Signed)
Please make sure that you drink plenty of fluid to stay well-hydrated. Eat small regular meals with a day and get plenty of rest. Please follow-up with your primary care physician as they can reevaluate you tomorrow and recheck your kidney function.  To the emergency department if you develop severe pain, lightheadedness or fainting, fever, or any other symptoms concerning to you.

## 2016-11-02 NOTE — Progress Notes (Signed)
Discharge instructions and med details reviewed with patient and daughter. Printed prescription for vicodin given to pt as well as AVS.  IV removed. Pt escorted out via wheelchair.  Wynema Birch, RN

## 2016-11-02 NOTE — Discharge Summary (Signed)
[facility] NAME: Anita Walker    MR#:  [id number]  DATE OF BIRTH:  02-Dec-1940  DATE OF ADMISSION:  10/30/2016   ADMITTING PHYSICIAN: Dustin Flock, MD  DATE OF DISCHARGE: 11/02/2016  PRIMARY CARE PHYSICIAN: Einar Pheasant, MD   ADMISSION DIAGNOSIS:   Pain [R52] Closed fracture of shaft of right femur, unspecified fracture morphology, initial encounter ([place]) [S72.301A]  DISCHARGE DIAGNOSIS:   Active Problems:   Right hip pain   SECONDARY DIAGNOSIS:   Past Medical History:  Diagnosis Date  . Anemia   . Atrial fibrillation ([place])   . Breast cancer ([place])    s/p lumpectomy 1992.  s/p chemo and xrt  . Diabetes mellitus ([place])   . Gastric ulcer   . GERD (gastroesophageal reflux disease)   . Hypercholesterolemia   . Hypertension   . Pulmonary emboli [facility])     HOSPITAL COURSE:   76 year old female with past medical history significant for left breast cancer status post chemotherapy in remission, atrial fibrillation and history of pulmonary embolism on Coumadin, diabetes, GERD, hypertension presents to Hospital secondary to right leg pain.  #1 right leg pain-distal right leg pain with some medial muscle spasms -X-ray without any fractures. No trauma noted. Appreciate orthopedics consult. -bone scan With no fracture or osteolytic lesion. Bilateral hip arthritis is present. -Added muscle relaxants with improvement in pain. Continue pain medications -Physical therapy consulted-patient ambulated pretty decent today. Will be discharged home with home health today  #2 hypertension-continue her losartan.   #3 history of DVT and pulmonary embolism-continue Coumadin. INR is therapeutic  #4 Hyperlipidemia-statin.   PT consulted. They have recommended home health at this time. Patient will be discharged today   DISCHARGE CONDITIONS:   Guarded  CONSULTS OBTAINED:   Treatment Team:  Hessie Knows, MD  DRUG  ALLERGIES:   Allergies  Allergen Reactions  . Penicillins Other (See Comments)    Has patient had a PCN reaction causing immediate rash, facial/tongue/throat swelling, SOB or lightheadedness with hypotension: Unknown Has patient had a PCN reaction causing severe rash involving mucus membranes or skin necrosis: Unknown Has patient had a PCN reaction that required hospitalization: Unknown Has patient had a PCN reaction occurring within the last 10 years: Unknown If all of the above answers are "NO", then may proceed with Cephalosporin use.   Marland Kitchen Penicillin V Potassium Nausea And Vomiting   DISCHARGE MEDICATIONS:   Allergies as of 11/02/2016      Reactions   Penicillins Other (See Comments)   Has patient had a PCN reaction causing immediate rash, facial/tongue/throat swelling, SOB or lightheadedness with hypotension: Unknown Has patient had a PCN reaction causing severe rash involving mucus membranes or skin necrosis: Unknown Has patient had a PCN reaction that required hospitalization: Unknown Has patient had a PCN reaction occurring within the last 10 years: Unknown If all of the above answers are "NO", then may proceed with Cephalosporin use.   Penicillin V Potassium Nausea And Vomiting      Medication List    STOP taking these medications   predniSONE 10 MG tablet Commonly known as:  DELTASONE     TAKE these medications   carbamide peroxide 6.5 % OTIC solution Commonly known as:  DEBROX 4 drops in the right ear daily.  Massage for approximately 5 minutes.   ferrous sulfate 325 (65 FE) MG tablet TAKE ONE (1) TABLET BY MOUTH EVERY DAY   HYDROcodone-acetaminophen 5-325 MG tablet Commonly known  as:  NORCO/VICODIN Take 1-2 tablets by mouth every 6 (six) hours as needed for moderate pain or severe pain.   losartan 50 MG tablet Commonly known as:  COZAAR TAKE ONE (1) TABLET EACH DAY   methocarbamol 500 MG tablet Commonly known as:  ROBAXIN Take 1 tablet (500 mg total) by  mouth every 8 (eight) hours as needed for muscle spasms.   pantoprazole 40 MG tablet Commonly known as:  PROTONIX Take 1 tablet (40 mg total) by mouth daily.   polyethylene glycol packet Commonly known as:  MIRALAX / GLYCOLAX Take 17 g by mouth daily as needed for mild constipation.   senna-docusate 8.6-50 MG tablet Commonly known as:  Senokot-S Take 1 tablet by mouth 2 (two) times daily.   simvastatin 10 MG tablet Commonly known as:  ZOCOR TAKE ONE (1) TABLET BY MOUTH EVERY DAY   warfarin 2.5 MG tablet Commonly known as:  COUMADIN TAKE TWO (2) TABLETS BY MOUTH ON TUE, THURS, SAT, & SUN; TAKE ONE (1) BY MOUTH DAILY ALL OTHER DAYS What changed:  additional instructions            Durable Medical Equipment        Start     Ordered   11/02/16 [phone]  For home use only DME Walker rolling  Once    Question:  Patient needs a walker to treat with the following condition  Answer:  Knee pain   11/02/16 0858       Discharge Care Instructions        Start     Ordered   11/02/16 0000  senna-docusate (SENOKOT-S) 8.6-50 MG tablet  2 times daily     11/02/16 0858   11/02/16 0000  polyethylene glycol (MIRALAX / GLYCOLAX) packet  Daily PRN     11/02/16 0858   11/02/16 0000  methocarbamol (ROBAXIN) 500 MG tablet  Every 8 hours PRN     11/02/16 0858   11/02/16 0000  HYDROcodone-acetaminophen (NORCO/VICODIN) 5-325 MG tablet  Every 6 hours PRN     11/02/16 0858   11/02/16 0000  Diet - low sodium heart healthy     11/02/16 0858   11/02/16 0000  Activity as tolerated - No restrictions     11/02/16 0858       DISCHARGE INSTRUCTIONS:   1. PCP f/u in 2 weeks 2. Orthopedics f/u in 2-3 weeks  DIET:   Cardiac diet  ACTIVITY:   Activity as tolerated  OXYGEN:   Home Oxygen: No.  Oxygen Delivery: room air  DISCHARGE LOCATION:   home   If you experience worsening of your admission symptoms, develop shortness of breath, life threatening emergency, suicidal or homicidal  thoughts you must seek medical attention immediately by calling 911 or calling your MD immediately  if symptoms less severe.  You Must read complete instructions/literature along with all the possible adverse reactions/side effects for all the Medicines you take and that have been prescribed to you. Take any new Medicines after you have completely understood and accpet all the possible adverse reactions/side effects.   Please note  You were cared for by a hospitalist during your hospital stay. If you have any questions about your discharge medications or the care you received while you were in the hospital after you are discharged, you can call the unit and asked to speak with the hospitalist on call if the hospitalist that took care of you is not available. Once you are discharged, your primary care physician will  handle any further medical issues. Please note that NO REFILLS for any discharge medications will be authorized once you are discharged, as it is imperative that you return to your primary care physician (or establish a relationship with a primary care physician if you do not have one) for your aftercare needs so that they can reassess your need for medications and monitor your lab values.    On the day of Discharge:  VITAL SIGNS:   Blood pressure 137/60, pulse 80, temperature 97.7 F (36.5 C), temperature source Oral, resp. rate 20, height 5\' 9"  (1.753 m), weight 109.3 kg (241 lb), SpO2 94 %.  PHYSICAL EXAMINATION:    GENERAL:  76 y.o.-year-old patient lying in the bed with no acute distress.  EYES: Pupils equal, round, reactive to light and accommodation. No scleral icterus. Extraocular muscles intact.  HEENT: Head atraumatic, normocephalic. Oropharynx and nasopharynx clear.  NECK:  Supple, no jugular venous distention. No thyroid enlargement, no tenderness.  LUNGS: Normal breath sounds bilaterally, no wheezing, rales,rhonchi or crepitation. No use of accessory muscles of  respiration.  CARDIOVASCULAR: S1, S2 normal. No  rubs, or gallops. 2/6 systolic murmur is present ABDOMEN: Soft, nontender, nondistended. Bowel sounds present. No organomegaly or mass.  EXTREMITIES: No pedal edema, cyanosis, or clubbing. Bilateral ankle edema noted NEUROLOGIC: Cranial nerves II through XII are intact. Muscle strength 5/5 in all extremities, much improved pain of right distal medial thigh pain. Sensation intact. Gait not checked.  PSYCHIATRIC: The patient is alert and oriented x 3.  SKIN: No obvious rash, lesion, or ulcer.  DATA REVIEW:   CBC  Recent Labs Lab 11/02/16 0309  WBC 8.4  HGB 15.4  HCT 45.9  PLT 255    Chemistries   Recent Labs Lab 10/30/16 1249  11/02/16 0309  NA 139  < > 138  K 4.1  < > 4.2  CL 104  < > 105  CO2 25  < > 26  GLUCOSE 169*  < > 118*  BUN 18  < > 23*  CREATININE 0.93  < > 0.84  CALCIUM 10.0  < > 9.3  AST 22  --   --   ALT 19  --   --   ALKPHOS 39  --   --   BILITOT 0.7  --   --   < > = values in this interval not displayed.   Microbiology Results  Results for orders placed or performed in visit on 07/18/13  CULTURE, URINE COMPREHENSIVE     Status: None   Collection Time: 07/18/13 10:19 AM  Result Value Ref Range Status   Colony Count NO GROWTH  Final   Organism ID, Bacteria NO GROWTH  Final    RADIOLOGY:  No results found.   Management plans discussed with the patient, family and they are in agreement.  CODE STATUS:     Code Status Orders        Start     Ordered   10/30/16 2012  Full code  Continuous     10/30/16 2011    Code Status History    Date Active Date Inactive Code Status Order ID Comments User Context   This patient has a current code status but no historical code status.      TOTAL TIME TAKING CARE OF THIS PATIENT: 37 minutes.    Yostin Malacara M.D on 11/02/2016 at 9:17 AM  Between 7am to 6pm - Pager - [phone]  After 6pm go to [email or web address] - [place]  [employer] [facility]  Office  [phone]  CC: Primary care physician; Einar Pheasant, MD   Note: This dictation was prepared with Dragon dictation along with smaller phrase technology. Any transcriptional errors that result from this process are unintentional.

## 2016-11-02 NOTE — ED Notes (Signed)
Pt st unable to void at present; I&O cath performed to obtain urine specimen at pt request; 56ml clear lt yellow urine obtained and sent to lab; pt tolerated well

## 2016-11-02 NOTE — ED Provider Notes (Signed)
[facility] Emergency Department Provider Note  ____________________________________________  Time seen: Approximately 5:56 PM  I have reviewed the triage vital signs and the nursing notes.   HISTORY  Chief Complaint Loss of Consciousness    HPI Anita Walker is a 76 y.o. female with a history of PE on Coumadin, A. fib, HTN, DM, presenting for syncope. The patient was discharged today after hospital stay for right lower extremity swelling and pain with the inability to walk. Her evaluation included right lower extremity ultrasound that was negative for blood clot, as well as bony evaluation was negative for any fractures. The patient reports that she was feeling well at home. Her daughter was assisting her to go to the bathroom and then brought her back and her bedroom where she sat down. After that, she does not remember what happened but says her daughter reports that she had passed out and that her eyes had rolled into the back of her head. She did not fall down or injure herself. We do not have any additional details on the syncopal event, but the patient denies any associated chest pain, palpitations, shortness of breath. She has not been having any pain or burning when she pees, nausea vomiting or diarrhea. She believes her last pain medication was this morning. EMS found her blood sugar to be 177.   Past Medical History:  Diagnosis Date  . Anemia   . Atrial fibrillation ([place])   . Breast cancer ([place])    s/p lumpectomy 1992.  s/p chemo and xrt  . Diabetes mellitus ([place])   . Gastric ulcer   . GERD (gastroesophageal reflux disease)   . Hypercholesterolemia   . Hypertension   . Pulmonary emboli [facility])     Patient Active Problem List   Diagnosis Date Noted  . Right hip pain 10/30/2016  . Cough 10/25/2016  . Health care maintenance 05/23/2014  . Adjustment disorder with mixed anxiety and depressed mood 07/20/2013  . Chronic anticoagulation  04/11/2013  . Hypertension 01/01/2012  . Hypercholesteremia 01/01/2012  . History of breast cancer 01/01/2012  . Essential (primary) hypertension 01/01/2012  . Pure hypercholesterolemia 01/01/2012  . Controlled type 2 diabetes mellitus without complication ([place]) 56/81/2751    Past Surgical History:  Procedure Laterality Date  . BREAST LUMPECTOMY  03/06/1990   left breast    Current Outpatient Rx  . Order #: [id number] Class: Normal  . Order #: [id number] Class: Normal  . Order #: [id number] Class: Print  . Order #: [id number] Class: Normal  . Order #: [id number] Class: Normal  . Order #: [id number] Class: Normal  . Order #: [id number] Class: Normal  . Order #: [id number] Class: Normal  . Order #: [id number] Class: Normal  . Order #: [id number] Class: Normal    Allergies Penicillins and Penicillin v potassium  Family History  Problem Relation Age of Onset  . Heart disease Father   . Heart disease Brother        s/p CABG  . Colon cancer Neg Hx     Social History Social History  Substance Use Topics  . Smoking status: Never Smoker  . Smokeless tobacco: Never Used  . Alcohol use No    Review of Systems Constitutional: No fever/chills.Positive syncope without lightheadedness or dizziness. Eyes: No visual changes. ENT: No sore throat. No congestion or rhinorrhea. Cardiovascular: Denies chest pain. Denies palpitations. Respiratory: Denies shortness of breath.  No cough. Gastrointestinal: No abdominal pain.  No nausea, no vomiting.  No diarrhea.  No constipation. Genitourinary: Negative for dysuria. Musculoskeletal:  Negative for back pain. Right lower extremity swelling and pain. Skin: Negative for rash. Neurological: Negative for headaches. No focal numbness, tingling or weakness.     ____________________________________________   PHYSICAL EXAM:  VITAL SIGNS: ED Triage Vitals  Enc Vitals Group     BP 11/02/16 1749 (!) 109/54     Pulse Rate 11/02/16 1749 93     Resp 11/02/16  1749 16     Temp 11/02/16 1749 97.9 F (36.6 C)     Temp Source 11/02/16 1749 Oral     SpO2 11/02/16 1749 98 %     Weight 11/02/16 1746 241 lb (109.3 kg)     Height 11/02/16 1746 5\' 9"  (1.753 m)     Head Circumference --      Peak Flow --      Pain Score 11/02/16 1745 0     Pain Loc --      Pain Edu? --      Excl. in [place]? --     Constitutional: Alert and oriented. Chronically ill appearing but in no acute distress. Answers questions appropriately. Eyes: Conjunctivae are normal.  EOMI. PERRLA. No scleral icterus. Head: Atraumatic. Nose: No congestion/rhinnorhea. Mouth/Throat: Mucous membranes are moist.  Neck: No stridor.  Supple.  No JVD. No meningismus. Full range of motion without pain. Cardiovascular: Normal rate, regular rhythm. No murmurs, rubs or gallops.  Respiratory: Normal respiratory effort.  No accessory muscle use or retractions. Lungs CTAB.  No wheezes, rales or ronchi. Gastrointestinal: Morbidly obese. Soft, nontender and nondistended.  No guarding or rebound.  No peritoneal signs. Musculoskeletal: No LE edema. No ttp in the calves or palpable cords.  Negative Homan's sign. Neurologic:  A&Ox3.  Speech is clear.  Face and smile are symmetric.  EOMI.  Moves all extremities well. Skin:  Skin is warm, dry and intact. No rash noted. Psychiatric: Mood and affect are normal. Speech and behavior are normal.  Normal judgement.  ____________________________________________   LABS (all labs ordered are listed, but only abnormal results are displayed)  Labs Reviewed  CBC - Abnormal; Notable for the following:       Result Value   WBC 12.5 (*)    RBC 5.42 (*)    Hemoglobin 16.2 (*)    HCT 47.9 (*)    RDW 15.0 (*)    All other components within normal limits  BASIC METABOLIC PANEL - Abnormal; Notable for the following:    Chloride 100 (*)    Glucose, Bld 174 (*)    BUN 34 (*)    Creatinine, Ser 1.42 (*)    GFR calc non Af Amer 34 (*)    GFR calc Af Amer 40 (*)    All  other components within normal limits  PROTIME-INR - Abnormal; Notable for the following:    Prothrombin Time 25.6 (*)    All other components within normal limits  APTT - Abnormal; Notable for the following:    aPTT 43 (*)    All other components within normal limits  GLUCOSE, CAPILLARY - Abnormal; Notable for the following:    Glucose-Capillary 158 (*)    All other components within normal limits  TROPONIN I  URINALYSIS, COMPLETE (UACMP) WITH MICROSCOPIC   ____________________________________________  EKG  ED ECG REPORT I, Eula Listen, the attending physician, personally viewed and interpreted this ECG.   Date: 11/02/2016  EKG Time: 1745  Rate: 96  Rhythm: normal sinus rhythm  Axis: leftward  Intervals:none  ST&T Change: No STEMI  ____________________________________________  RADIOLOGY  Ct Angio Chest Pe W And/or Wo Contrast  Result Date: 11/02/2016 CLINICAL DATA:  Syncopal episode while in bed this evening. EXAM: CT ANGIOGRAPHY CHEST WITH CONTRAST TECHNIQUE: Multidetector CT imaging of the chest was performed using the standard protocol during bolus administration of intravenous contrast. Multiplanar CT image reconstructions and MIPs were obtained to evaluate the vascular anatomy. CONTRAST:  75 cc Isovue 370 IV COMPARISON:  The abdomen ultrasound 08/03/2009 FINDINGS: Cardiovascular: Coronary arteriosclerosis along the circumflex. Normal sized heart without pericardial effusion. No aortic aneurysm or dissection. No acute large central pulmonary embolus. Mediastinum/Nodes: Moderate-sized hiatal hernia. Normal esophagus. Trachea and mainstem bronchi are patent. 9 mm hypodense nodule of the left thyroid gland. Lungs/Pleura: Limited by motion artifacts. Small bilateral 5 mm or less pulmonary nodules are noted and are as follows: 1. 5 mm right lower lobe superior segment nodule, series 6, image 35. 2. 3 mm nodule, series 6, image 44 also in the right lower lobe. 3. 4.8 mm  nodule in the left lower lobe, series 6, image 59. No pneumonic consolidation, effusion or pneumothorax. Upper Abdomen: Documented cyst (by ultrasound) is again seen in the right hepatic lobe measuring 2.2 cm and is unchanged relative to 2011. 1.2 cm exophytic simple cyst off right kidney. Musculoskeletal: Thoracolumbar spondylosis. No acute osseous abnormality. Review of the MIP images confirms the above findings. IMPRESSION: 1. Coronary arteriosclerosis along the circumflex. 2. No acute pulmonary embolus. 3. 5 mm or less pulmonary nodules bilaterally. No follow-up needed if patient is low-risk (and has no known or suspected primary neoplasm). Non-contrast chest CT can be considered in 12 months if patient is high-risk. This recommendation follows the consensus statement: Guidelines for Management of Incidental Pulmonary Nodules Detected on CT Images: From the Fleischner Society 2017; Radiology 2017; 284:228-243. 4. Hepatic and right renal cysts. 5. Moderate-sized hiatal hernia. Electronically Signed   By: Ashley Royalty M.D.   On: 11/02/2016 18:38    ____________________________________________   PROCEDURES  Procedure(s) performed: None  Procedures  Critical Care performed: No ____________________________________________   INITIAL IMPRESSION / ASSESSMENT AND PLAN / ED COURSE  Pertinent labs & imaging results that were available during my care of the patient were reviewed by me and considered in my medical decision making (see chart for details).  76 y.o. female with a history of PE on warfarin, recent hospitalization, presenting with syncopal episode that did not have any preceding symptoms. There are multiple possible etiologies for this patient's syncope. I do not see any evidence of arrhythmia but we'll continue to keep her on a cardiac monitor we will also check a troponin. With her history of PE and recent hospitalization, will get a CT angiogram to rule out blood clot. We will check for  infection including urinalysis. This could be due to occasions including pain medications but the patient does not think she had any this afternoon. She may have had a vasovagal episode given that she had been going to the bathroom. Blood sugar is normal per Plan reevaluation for final disposition.  ----------------------------------------- 8:37 PM on 11/02/2016 -----------------------------------------  The patient states that she is feeling fine at this time, however she continues to be tachycardic when she stands. We will give her additional fluid and reevaluate her orthostatics. Unfortunately, she missed when she was given a urine sample and I do think that given her recent hospitalization and elevated white blood cell count, that we should await the results of a urinalysis prior to discharge. We will give her the fluid,  and get a catheterized sample. After that, if her vital signs normalized, I anticipate she'll be able to be discharged home. If not, we will admit her for rehydration.  ----------------------------------------- 11:01 PM on 11/02/2016 -----------------------------------------  The patient has remained hemodynamically stable throughout her stay. She has received 2-1/2 L of intravenous fluids and is able to tolerate liquids. Her urinalysis does not show infection. At this time, the patient is safe for discharge. Return for depression as well as follow-up instructions were discussed  ____________________________________________  FINAL CLINICAL IMPRESSION(S) / ED DIAGNOSES  Final diagnoses:  Syncope, unspecified syncope type  Orthostasis  Acute renal insufficiency         NEW MEDICATIONS STARTED DURING THIS VISIT:  New Prescriptions   No medications on file      Eula Listen, MD 11/02/16 2301

## 2016-11-02 NOTE — ED Triage Notes (Addendum)
Pt arrives via EMS from home, pt was discharged this AM from hospital for "muscle cramps and hip pain", states at home this evening she had a syncopal episode on the bed after  Bathing and using the bathroom, awake and alert, denies any pain

## 2016-11-02 NOTE — ED Notes (Signed)
EDP at bedside  

## 2016-11-02 NOTE — Progress Notes (Signed)
TO [facility] IT MAY CONCERN    Ms. Andria Meuse has been taking care of her mother Ms. Jo Ann Martinique while she is admitted to the hospital for medical reasons. Mr. Martinique will be discharged today on 10/16/2016 and will need help with activities of daily living. So please excuse Ms. Burton on 11/02/2016 and 11/03/2016 while she is helping her mother.  Please call if any questions or concerns.  Gladstone Lighter, M.D. [facility] [address] [place], [place] [address] [facility]: [phone]

## 2016-11-02 NOTE — ED Notes (Signed)
Pt assisted to room commode to void large amount urine; pt ambulatory with assistance; denies any c/o; pt missed specimen hat upon voided; orthostatics performed upon transfer to commode; ret to bed; Dr Mariea Clonts to bedside to update pt on plan of care

## 2016-11-02 NOTE — Progress Notes (Signed)
Physical Therapy Treatment Patient Details Name: Anita Walker MRN: [id number] DOB: [birth year] Today's Date: 11/02/2016    History of Present Illness Pt admitted for complaints of R hip pain. Pt with history of anemia, Afib, DM, GERD, breast cancer post chemo in remission, and HTN. Per imaging, appears to suggest arthritis with no fracture noted. DVT imaging negative at this time.     PT Comments    Pt is making good progress towards goal with increased ambulation distance and safety with RW. Able to perform stair training with ease and reports she feels comfortable entering home. Will continue to progress as able.    Follow Up Recommendations  Home health PT;Supervision - Intermittent     Equipment Recommendations  Rolling walker with 5" wheels    Recommendations for Other Services       Precautions / Restrictions Precautions Precautions: Fall Restrictions Weight Bearing Restrictions: No    Mobility  Bed Mobility Overal bed mobility: Modified Independent Bed Mobility: Supine to Sit     Supine to sit: Supervision     General bed mobility comments: uses rail for assistance, however able to perform safely. Once sitting at EOB, able to don shoes without difficulty  Transfers Overall transfer level: Needs assistance Equipment used: Rolling walker (2 wheeled) Transfers: Sit to/from Stand Sit to Stand: Supervision         General transfer comment: safe technique with transfer. RW used and pt able to stand upright with equal WBing through B LEs.  Ambulation/Gait Ambulation/Gait assistance: Supervision Ambulation Distance (Feet): 200 Feet Assistive device: Rolling walker (2 wheeled) Gait Pattern/deviations: Step-through pattern     General Gait Details: ambulated in hallway with reciprocal gait pattern and safe technique using RW. upright posture noted. Pain appears to decrease with increased distance.   Stairs Stairs: Yes   Stair Management: Two  rails Number of Stairs: 4 General stair comments: ambulated with step to gait pattern, refuses to use just L rail to simulate at home, prefers to use B rails. Somewhat scared and anxious to perform stair training, however does well.  Wheelchair Mobility    Modified Rankin (Stroke Patients Only)       Balance                                            Cognition Arousal/Alertness: Awake/alert Behavior During Therapy: WFL for tasks assessed/performed Overall Cognitive Status: Within Functional Limits for tasks assessed                                        Exercises Other Exercises Other Exercises: ambulated to bathroom with supervision and set up for hygiene. Safe technique performed    General Comments        Pertinent Vitals/Pain Pain Assessment: 0-10 Pain Score: 6  Pain Location: R thigh Pain Descriptors / Indicators: Aching Pain Intervention(s): Limited activity within patient's tolerance    Home Living                      Prior Function            PT Goals (current goals can now be found in the care plan section) Acute Rehab PT Goals Patient Stated Goal: to be able to walk without RW PT Goal  Formulation: With patient Time For Goal Achievement: 11/15/16 Potential to Achieve Goals: Good Progress towards PT goals: Progressing toward goals    Frequency    Min 2X/week      PT Plan Current plan remains appropriate    Co-evaluation              AM-PAC PT "6 Clicks" Daily Activity  Outcome Measure  Difficulty turning over in bed (including adjusting bedclothes, sheets and blankets)?: None Difficulty moving from lying on back to sitting on the side of the bed? : None Difficulty sitting down on and standing up from a chair with arms (e.g., wheelchair, bedside commode, etc,.)?: None Help needed moving to and from a bed to chair (including a wheelchair)?: None Help needed walking in hospital room?:  None Help needed climbing 3-5 steps with a railing? : None 6 Click Score: 24    End of Session Equipment Utilized During Treatment: Gait belt Activity Tolerance: Patient tolerated treatment well Patient left: in chair;with chair alarm set Nurse Communication: Mobility status PT Visit Diagnosis: Muscle weakness (generalized) (M62.81);Difficulty in walking, not elsewhere classified (R26.2)     Time: 8466-5993 PT Time Calculation (min) (ACUTE ONLY): 23 min  Charges:  $Gait Training: 23-37 mins                    G Codes:       Greggory Stallion, PT, DPT [phone]    Anita Walker 11/02/2016, 11:41 AM

## 2016-11-02 NOTE — Care Management Note (Addendum)
Case Management Note  Patient Details  Name: Anita Walker MRN: [id number] Date of Birth: [birth year]  Subjective/Objective: Discharging today                   Action/Plan: [facility] notified of discharge. Gilford Rile has been delivered.  Spoke with patient and updated her on POC. All questions answered  Expected Discharge Date:  11/02/16               Expected Discharge Plan:  [place]  In-House Referral:     Discharge planning Services  CM Consult  Post Acute Care Choice:  Durable Medical Equipment, Home Health Choice offered to:  Patient  DME Arranged:  Walker rolling DME Agency:  [place] Arranged:  PT [facility] Agency:  [place]  Status of Service:  Completed, signed off  If discussed at [place] of Stay Meetings, dates discussed:    Additional Comments:  Jolly Mango, RN 11/02/2016, 9:14 AM

## 2016-11-03 ENCOUNTER — Telehealth: Payer: Self-pay | Admitting: *Deleted

## 2016-11-03 NOTE — Telephone Encounter (Signed)
Transition Care Management Follow-up Telephone Call  How have you been since you were released from the hospital? Patient stated she feels a whole lot better , but she did have a syncope episode on 11/02/16.    Do you understand why you were in the hospital? yes   Do you understand the discharge instrcutions? Yes  Items Reviewed:  Medications reviewed: Yes  Allergies reviewed: Yes  Dietary changes reviewed: Yes  Referrals reviewed: Yes   Functional Questionnaire:   Activities of Daily Living (ADLs):   She states they are independent in the following: Yes completely dependent except for climbing steps. States they require assistance with the following: PT which  Has been arranges by CSW with [facility] and will be in the home on Monday.   Any transportation issues/concerns?: No   Any patient concerns? Yes, daughter would like to know can she give patient Gatorade or Pedia-lyte to keep hydration status?   Confirmed importance and date/time of follow-up visits scheduled: Yes   Confirmed with patient if condition begins to worsen call PCP or go to the ER.  Patient was given the Call-a-Nurse line [phone]: Yes

## 2016-11-03 NOTE — Telephone Encounter (Signed)
FYI, patient feeling much better TCM completed , patient daughter did ask if she could give patient Gatorade or pedia-lyte to help maintain hydration? [facility] is going out for PT evaluation on Monday. Just appointment date and time.

## 2016-11-03 NOTE — Telephone Encounter (Signed)
Agree with staying hydrated.  Gentle use of pedia lyte.  She had previously been having problems with increased blood pressure, so would avoid increased sodium intake (gatorade has increased sodium).

## 2016-11-03 NOTE — Telephone Encounter (Signed)
Patient aware of gentle use of pedia-lyte,please hold for work in appointment per Dr. Nicki Reaper.

## 2016-11-03 NOTE — Telephone Encounter (Signed)
See other message trying to work patient in

## 2016-11-03 NOTE — Telephone Encounter (Signed)
Per your request I am forwarding this to you

## 2016-11-03 NOTE — Telephone Encounter (Signed)
Pt will need a ER follow up, pt has requested to have this today  Please advise, she was seen at [facility] on 11/02/16  Pt contact [phone]

## 2016-11-06 DIAGNOSIS — I10 Essential (primary) hypertension: Secondary | ICD-10-CM | POA: Diagnosis not present

## 2016-11-06 DIAGNOSIS — Z853 Personal history of malignant neoplasm of breast: Secondary | ICD-10-CM | POA: Diagnosis not present

## 2016-11-06 DIAGNOSIS — Z7901 Long term (current) use of anticoagulants: Secondary | ICD-10-CM | POA: Diagnosis not present

## 2016-11-06 DIAGNOSIS — S72301D Unspecified fracture of shaft of right femur, subsequent encounter for closed fracture with routine healing: Secondary | ICD-10-CM | POA: Diagnosis not present

## 2016-11-06 DIAGNOSIS — E119 Type 2 diabetes mellitus without complications: Secondary | ICD-10-CM | POA: Diagnosis not present

## 2016-11-06 DIAGNOSIS — M62838 Other muscle spasm: Secondary | ICD-10-CM | POA: Diagnosis not present

## 2016-11-06 DIAGNOSIS — D649 Anemia, unspecified: Secondary | ICD-10-CM | POA: Diagnosis not present

## 2016-11-06 DIAGNOSIS — Z923 Personal history of irradiation: Secondary | ICD-10-CM | POA: Diagnosis not present

## 2016-11-06 DIAGNOSIS — I4891 Unspecified atrial fibrillation: Secondary | ICD-10-CM | POA: Diagnosis not present

## 2016-11-06 DIAGNOSIS — Z9221 Personal history of antineoplastic chemotherapy: Secondary | ICD-10-CM | POA: Diagnosis not present

## 2016-11-06 DIAGNOSIS — Z86711 Personal history of pulmonary embolism: Secondary | ICD-10-CM | POA: Diagnosis not present

## 2016-11-07 ENCOUNTER — Ambulatory Visit (INDEPENDENT_AMBULATORY_CARE_PROVIDER_SITE_OTHER): Payer: Medicare Other | Admitting: Internal Medicine

## 2016-11-07 ENCOUNTER — Encounter: Payer: Self-pay | Admitting: Internal Medicine

## 2016-11-07 VITALS — BP 144/64 | HR 99 | Resp 16 | Ht 69.0 in | Wt 235.0 lb

## 2016-11-07 VITALS — BP 132/70 | HR 92 | Temp 97.7°F | Wt 237.2 lb

## 2016-11-07 DIAGNOSIS — D72829 Elevated white blood cell count, unspecified: Secondary | ICD-10-CM | POA: Diagnosis not present

## 2016-11-07 DIAGNOSIS — R7989 Other specified abnormal findings of blood chemistry: Secondary | ICD-10-CM | POA: Diagnosis not present

## 2016-11-07 DIAGNOSIS — R05 Cough: Secondary | ICD-10-CM

## 2016-11-07 DIAGNOSIS — R55 Syncope and collapse: Secondary | ICD-10-CM

## 2016-11-07 DIAGNOSIS — R059 Cough, unspecified: Secondary | ICD-10-CM

## 2016-11-07 DIAGNOSIS — J438 Other emphysema: Secondary | ICD-10-CM | POA: Diagnosis not present

## 2016-11-07 DIAGNOSIS — Z23 Encounter for immunization: Secondary | ICD-10-CM

## 2016-11-07 DIAGNOSIS — R918 Other nonspecific abnormal finding of lung field: Secondary | ICD-10-CM

## 2016-11-07 DIAGNOSIS — M25551 Pain in right hip: Secondary | ICD-10-CM

## 2016-11-07 DIAGNOSIS — E119 Type 2 diabetes mellitus without complications: Secondary | ICD-10-CM

## 2016-11-07 DIAGNOSIS — I10 Essential (primary) hypertension: Secondary | ICD-10-CM | POA: Diagnosis not present

## 2016-11-07 DIAGNOSIS — Z1239 Encounter for other screening for malignant neoplasm of breast: Secondary | ICD-10-CM

## 2016-11-07 NOTE — Telephone Encounter (Signed)
Pt scheduled for 11/07/16 at 3

## 2016-11-07 NOTE — Patient Instructions (Signed)
Repeat CT chest in 6 months and then follow up after that for results.

## 2016-11-07 NOTE — Progress Notes (Signed)
[facility] Consultation      Assessment and Plan:  76 year old female with history of pulmonary embolism on Coumadin, GERD with large hiatal hernia, history of breast cancer in remission. Found to have small lung nodules.  Lung nodules. -Discussed with patient and daughter, currently these are to small to characterize by PET scanning, and too small to biopsy. The nodules appear to be low risk, patient's history is intermediate risk given her history of secondhand smoke exposure. She has a history of breast cancer, but it is remote, and the likelihood of breast cancer recurrence at this time is very low. -Repeat CT scan in 6 months time, without contrast. Continue for total of 2 years of surveillance.  GERD/hiatal hernia/intrathoracic stomach. -Discussed her current GERD and hiatal hernia. Currently, she is minimally symptomatic, taking pantoprazole every other day, which appears to be appropriate at this time.  Emphysema. -Hyperinflation consistent with emphysema seen on CT chest, however, the patient is not symptomatic from this. The patient has no history of smoking, but does have secondhand smoke exposure from her husband who smoked up to 3 packs per day. -Given lack of symptoms, no treatment is necessary at this time.  Date: 11/07/2016  MRN# [id number] Anita Walker, Anita Walker    Anita Walker is a 76 y.o. old female seen in consultation for chief complaint of:    Chief Complaint  Patient presents with  . Advice Only    Referred by Dr. Einar Pheasant  . Cough    abnormal cxr    HPI:   The patient is a 76 year old female, recently discharged from the hospital on 11/02/2016, after a three-day admission for right leg pain. She is known to have a history of pulmonary embolism on Coumadin, GERD, atrial fibrillation, breast cancer status post chemotherapy in remission. She presented back to the ED on 11/02/2016, the day of discharge, with syncope.  CT chest was negative for pulmonary was some, she was given IV fluids for dehydration and discharged home.  Since that time she things that she is doing better, she is able to take a shower, she is sleeping in her recliner and using a bedside commode. Otherwise she is ambulating around her home without issue. She feels that her breathing has been pretty good. She is not a smoker but her husband smoked. Her brca is remote, she has a lumpectomy in '92, and then chemo and rads, has been in remission.  Currently she has no respiratory symptoms, she had an episode of pneumonia with severe coughing which is now better. No hemoptysis. She denies significant weight loss.  She takes protonix every other day she has no side effects from it.   **CBC, 10/30/2016;Eos=0.   **Images personally reviewed; CT chest 11/02/2016 and chest x-ray 10/24/2016; consistent with hyperinflation and emphysema, large hiatal hernia/intrathoracic stomach. Multiple bilateral nodules, largest of which is 5 mm   PMHX:   Past Medical History:  Diagnosis Date  . Anemia   . Atrial fibrillation ([place])   . Breast cancer ([place])    s/p lumpectomy 1992.  s/p chemo and xrt  . Diabetes mellitus ([place])   . Gastric ulcer   . GERD (gastroesophageal reflux disease)   . Hypercholesterolemia   . Hypertension   . Pulmonary emboli [facility])    Surgical Hx:  Past Surgical History:  Procedure Laterality Date  . BREAST LUMPECTOMY  03/06/1990   left breast   Family Hx:  Family History  Problem Relation Age of  Onset  . Heart disease Father   . Heart disease Brother        s/p CABG  . Colon cancer Neg Hx    Social Hx:   Social History  Substance Use Topics  . Smoking status: Never Smoker  . Smokeless tobacco: Never Used  . Alcohol use No   Medication:    Current Outpatient Prescriptions:  .  carbamide peroxide (DEBROX) 6.5 % OTIC solution, 4 drops in the right ear daily.  Massage for approximately 5 minutes., Disp: 15 mL, Rfl: 0 .   ferrous sulfate 325 (65 FE) MG tablet, TAKE ONE (1) TABLET BY MOUTH EVERY DAY, Disp: 90 tablet, Rfl: 3 .  HYDROcodone-acetaminophen (NORCO/VICODIN) 5-325 MG tablet, Take 1-2 tablets by mouth every 6 (six) hours as needed for moderate pain or severe pain., Disp: 24 tablet, Rfl: 0 .  losartan (COZAAR) 50 MG tablet, TAKE ONE (1) TABLET EACH DAY, Disp: 90 tablet, Rfl: 1 .  methocarbamol (ROBAXIN) 500 MG tablet, Take 1 tablet (500 mg total) by mouth every 8 (eight) hours as needed for muscle spasms., Disp: 20 tablet, Rfl: 0 .  pantoprazole (PROTONIX) 40 MG tablet, Take 1 tablet (40 mg total) by mouth daily. (Patient taking differently: Take 40 mg by mouth every other day. ), Disp: 90 tablet, Rfl: 1 .  polyethylene glycol (MIRALAX / GLYCOLAX) packet, Take 17 g by mouth daily as needed for mild constipation., Disp: 14 each, Rfl: 0 .  senna-docusate (SENOKOT-S) 8.6-50 MG tablet, Take 1 tablet by mouth 2 (two) times daily., Disp: 60 tablet, Rfl: 0 .  simvastatin (ZOCOR) 10 MG tablet, TAKE ONE (1) TABLET BY MOUTH EVERY DAY, Disp: 90 tablet, Rfl: 1 .  warfarin (COUMADIN) 2.5 MG tablet, TAKE TWO (2) TABLETS BY MOUTH ON TUE, THURS, SAT, & SUN; TAKE ONE (1) BY MOUTH DAILY ALL OTHER DAYS (Patient taking differently: TAKE TWO (2) TABLETS BY MOUTH ON Sunday, Tuesday, AND Thursday. Take 1 tab on Monday, Wednesday, Friday and saturday), Disp: 120 tablet, Rfl: 1   Allergies:  Penicillins and Penicillin v potassium  Review of Systems: Gen:  Denies  fever, sweats, chills HEENT: Denies blurred vision, double vision. bleeds, sore throat Cvc:  No dizziness, chest pain. Resp:   Denies cough or sputum production, shortness of breath Gi: Denies swallowing difficulty, stomach pain. Gu:  Denies bladder incontinence, burning urine Ext:   No Joint pain, stiffness. Skin: No skin rash,  hives  Endoc:  No polyuria, polydipsia. Psych: No depression, insomnia. Other:  All other systems were reviewed with the patient and were  negative other that what is mentioned in the HPI.   Physical Examination:   VS: BP (!) 144/64 (BP Location: Left Arm, Cuff Size: Normal)   Pulse 99   Resp 16   Ht _0  (1.753 m)   Wt 235 lb (106.6 kg)   SpO2 98%   BMI 34.70 kg/m   General Appearance: No distress  Neuro:without focal findings,  speech normal,  HEENT: PERRLA, EOM intact.   Pulmonary: normal breath sounds, No wheezing.  CardiovascularNormal S1,S2.  No m/r/g.   Abdomen: Benign, Soft, non-tender. Renal:  No costovertebral tenderness  GU:  No performed at this time. Endoc: No evident thyromegaly, no signs of acromegaly. Skin:   warm, no rashes, no ecchymosis  Extremities: normal, no cyanosis, clubbing.  Other findings:    LABORATORY PANEL:   CBC  Recent Labs Lab 11/02/16 1751  WBC 12.5*  HGB 16.2*  HCT 47.9*  PLT  268   ------------------------------------------------------------------------------------------------------------------  Chemistries   Recent Labs Lab 11/02/16 1751  NA 135  K 4.4  CL 100*  CO2 22  GLUCOSE 174*  BUN 34*  CREATININE 1.42*  CALCIUM 10.1   ------------------------------------------------------------------------------------------------------------------  Cardiac Enzymes  Recent Labs Lab 11/02/16 1751  TROPONINI <0.03   ------------------------------------------------------------  RADIOLOGY:  No results found.     Thank  you for the consultation and for allowing [facility] to assist in the care of your patient. Our recommendations are noted above.  Please contact us if we can be of further service.   Marda Stalker, MD.  Board Certified in Internal Medicine, Pulmonary Medicine, [place], and Sleep Medicine.  [facility] Office Number: [phone]  Patricia Pesa, M.D.  Merton Border, M.D  11/07/2016

## 2016-11-07 NOTE — Progress Notes (Signed)
[name], female   DOB: 1940/11/21, 76 y.o.   MRN: [id number]   Subjective:    [name], female    DOB: 07/26/1940, 77 y.o.   MRN: [id number]  HPI  Patient here for hospital follow up. She is accompanied by her daughter.  History obtained from both of them.  Was admitted 10/30/16 with concern regarding possible fracture.  Presented with right hip/leg pain.  Ortho consulted.  Bone scan - no fracture.  Physical therapy consulted.  Is receiving at home.  Was discharged on 11/02/16.  On the day of discharge, she had a syncopal episode.  Was taken back to the ER.  Was monitored and CT angiogram was obtained.  Results as outlined.  She was given IV fluids.  Was discharged.  States since her discharge, she has been doing better.  Feels better.  No further syncopal or near syncopal episodes.  No chest pain.  Breathing stable.  Denies sob. No abdominal pain.  Bowels moving.  Was concerned may have ear wax build up in right ear.  No pain.     Past Medical History:  Diagnosis Date  . Anemia   . Atrial fibrillation ([place])   . Breast cancer ([place])    s/p lumpectomy 1992.  s/p chemo and xrt  . Diabetes mellitus ([place])   . Gastric ulcer   . GERD (gastroesophageal reflux disease)   . Hypercholesterolemia   . Hypertension   . Pulmonary emboli [facility])    Past Surgical History:  Procedure Laterality Date  . BREAST LUMPECTOMY  03/06/1990   left breast   Family History  Problem Relation Age of Onset  . Heart disease Father   . Heart disease Brother        s/p CABG  . Colon cancer Neg Hx    Social History   Social History  . Marital status: Married    Spouse name: N/A  . Number of children: N/A  . Years of education: N/A   Social History Main Topics  . Smoking status: Never Smoker  . Smokeless tobacco: Never Used  . Alcohol use No  . Drug use: No  . Sexual activity: Not Asked   Other Topics Concern  . None   Social History Narrative  . None     Outpatient Encounter Prescriptions as of 11/07/2016  Medication Sig  . carbamide peroxide (DEBROX) 6.5 % OTIC solution 4 drops in the right ear daily.  Massage for approximately 5 minutes.  . ferrous sulfate 325 (65 FE) MG tablet TAKE ONE (1) TABLET BY MOUTH EVERY DAY  . HYDROcodone-acetaminophen (NORCO/VICODIN) 5-325 MG tablet Take 1-2 tablets by mouth every 6 (six) hours as needed for moderate pain or severe pain.  Marland Kitchen losartan (COZAAR) 50 MG tablet TAKE ONE (1) TABLET EACH DAY  . methocarbamol (ROBAXIN) 500 MG tablet Take 1 tablet (500 mg total) by mouth every 8 (eight) hours as needed for muscle spasms.  . pantoprazole (PROTONIX) 40 MG tablet Take 1 tablet (40 mg total) by mouth daily. (Patient taking differently: Take 40 mg by mouth every other day. )  . simvastatin (ZOCOR) 10 MG tablet TAKE ONE (1) TABLET BY MOUTH EVERY DAY  . warfarin (COUMADIN) 2.5 MG tablet TAKE TWO (2) TABLETS BY MOUTH ON TUE, THURS, SAT, & SUN; TAKE ONE (1) BY MOUTH DAILY ALL OTHER DAYS (Patient taking differently: TAKE TWO (2) TABLETS BY MOUTH ON Sunday, Tuesday, AND Thursday. Take 1 tab on Monday,  Wednesday, Friday and saturday)  . [DISCONTINUED] polyethylene glycol (MIRALAX / GLYCOLAX) packet Take 17 g by mouth daily as needed for mild constipation. (Patient not taking: Reported on 11/07/2016)  . [DISCONTINUED] senna-docusate (SENOKOT-S) 8.6-50 MG tablet Take 1 tablet by mouth 2 (two) times daily. (Patient not taking: Reported on 11/07/2016)   No facility-administered encounter medications on file as of 11/07/2016.     Review of Systems  Constitutional: Negative for appetite change and unexpected weight change.  HENT: Negative for congestion and sinus pressure.   Respiratory: Negative for cough, chest tightness and shortness of breath.   Cardiovascular: Negative for chest pain, palpitations and leg swelling.  Gastrointestinal: Negative for abdominal pain, diarrhea, nausea and vomiting.  Genitourinary: Negative for  difficulty urinating and dysuria.  Musculoskeletal: Negative for joint swelling and myalgias.  Skin: Negative for color change and rash.  Neurological: Negative for dizziness, light-headedness and headaches.  Psychiatric/Behavioral: Negative for dysphoric mood.       Objective:    Physical Exam  Constitutional: She appears well-developed and well-nourished. No distress.  HENT:  Nose: Nose normal.  Mouth/Throat: Oropharynx is clear and moist.  Neck: Neck supple. No thyromegaly present.  Cardiovascular: Normal rate and regular rhythm.   Pulmonary/Chest: Breath sounds normal. No respiratory distress. She has no wheezes.  Abdominal: Soft. Bowel sounds are normal. There is no tenderness.  Musculoskeletal: She exhibits no edema or tenderness.  Lymphadenopathy:    She has no cervical adenopathy.  Skin: No rash noted. No erythema.  Psychiatric: She has a normal mood and affect. Her behavior is normal.    BP 132/70 (BP Location: Right Arm, Patient Position: Sitting, Cuff Size: Large)   Pulse 92   Temp 97.7 F (36.5 C) (Oral)   Wt 237 lb 3.2 oz (107.6 kg)   SpO2 94%   BMI 35.03 kg/m  Wt Readings from Last 3 Encounters:  11/07/16 237 lb 3.2 oz (107.6 kg)  11/07/16 235 lb (106.6 kg)  11/02/16 241 lb (109.3 kg)     Lab Results  Component Value Date   WBC 7.9 11/07/2016   HGB 15.0 11/07/2016   HCT 45.0 11/07/2016   PLT 206.0 11/07/2016   GLUCOSE 113 (H) 11/07/2016   CHOL 131 09/15/2016   TRIG 104.0 09/15/2016   HDL 49.00 09/15/2016   LDLCALC 61 09/15/2016   ALT 19 10/30/2016   AST 22 10/30/2016   NA 136 11/07/2016   K 4.9 11/07/2016   CL 101 11/07/2016   CREATININE 0.91 11/07/2016   BUN 18 11/07/2016   CO2 25 11/07/2016   TSH 2.55 01/21/2016   INR 2.36 11/02/2016   HGBA1C 5.8 09/08/2016   MICROALBUR <0.7 01/21/2016    Ct Angio Chest Pe W And/or Wo Contrast  Result Date: 11/02/2016 CLINICAL DATA:  Syncopal episode while in bed this evening. EXAM: CT ANGIOGRAPHY  CHEST WITH CONTRAST TECHNIQUE: Multidetector CT imaging of the chest was performed using the standard protocol during bolus administration of intravenous contrast. Multiplanar CT image reconstructions and MIPs were obtained to evaluate the vascular anatomy. CONTRAST:  75 cc Isovue 370 IV COMPARISON:  The abdomen ultrasound 08/03/2009 FINDINGS: Cardiovascular: Coronary arteriosclerosis along the circumflex. Normal sized heart without pericardial effusion. No aortic aneurysm or dissection. No acute large central pulmonary embolus. Mediastinum/Nodes: Moderate-sized hiatal hernia. Normal esophagus. Trachea and mainstem bronchi are patent. 9 mm hypodense nodule of the left thyroid gland. Lungs/Pleura: Limited by motion artifacts. Small bilateral 5 mm or less pulmonary nodules are noted and are as follows:  1. 5 mm right lower lobe superior segment nodule, series 6, image 35. 2. 3 mm nodule, series 6, image 44 also in the right lower lobe. 3. 4.8 mm nodule in the left lower lobe, series 6, image 59. No pneumonic consolidation, effusion or pneumothorax. Upper Abdomen: Documented cyst (by ultrasound) is again seen in the right hepatic lobe measuring 2.2 cm and is unchanged relative to 2011. 1.2 cm exophytic simple cyst off right kidney. Musculoskeletal: Thoracolumbar spondylosis. No acute osseous abnormality. Review of the MIP images confirms the above findings. IMPRESSION: 1. Coronary arteriosclerosis along the circumflex. 2. No acute pulmonary embolus. 3. 5 mm or less pulmonary nodules bilaterally. No follow-up needed if patient is low-risk (and has no known or suspected primary neoplasm). Non-contrast chest CT can be considered in 12 months if patient is high-risk. This recommendation follows the consensus statement: Guidelines for Management of Incidental Pulmonary Nodules Detected on CT Images: From the Fleischner Society 2017; Radiology 2017; 284:228-243. 4. Hepatic and right renal cysts. 5. Moderate-sized hiatal  hernia. Electronically Signed   By: Ashley Royalty M.D.   On: 11/02/2016 18:38       Assessment & Plan:   Problem List Items Addressed This Visit    Cough    Has resolved.  Off prednisone.        Hypertension    Blood pressure doing better.  Continue same medication regimen.  Follow pressures.  Follow metabolic panel.        Syncope    Recent admission as outlined.  Hydrated.  Doing better.  Recheck met b and cbc.        Type 2 diabetes mellitus without complications ([place])    Sugars have been doing well.  Continue to follow met b and a1c.  No problems with low sugars.         Other Visit Diagnoses    Screening for breast cancer    -  Primary   Relevant Orders   MM DIGITAL SCREENING BILATERAL   Leukocytosis, unspecified type       Relevant Orders   CBC with Differential/Platelet (Completed)   Elevated serum creatinine       Relevant Orders   Basic metabolic panel (Completed)   Encounter for immunization       Relevant Orders   Flu vaccine HIGH DOSE PF (Completed)   Pain of right hip joint       improved.  working with therapy.  follow.         Einar Pheasant, MD

## 2016-11-08 LAB — CBC WITH DIFFERENTIAL/PLATELET
BASOS PCT: 1.1 % (ref 0.0–3.0)
Basophils Absolute: 0.1 10*3/uL (ref 0.0–0.1)
EOS ABS: 0.1 10*3/uL (ref 0.0–0.7)
Eosinophils Relative: 1.5 % (ref 0.0–5.0)
HEMATOCRIT: 45 % (ref 36.0–46.0)
HEMOGLOBIN: 15 g/dL (ref 12.0–15.0)
LYMPHS PCT: 28.5 % (ref 12.0–46.0)
Lymphs Abs: 2.3 10*3/uL (ref 0.7–4.0)
MCHC: 33.3 g/dL (ref 30.0–36.0)
MCV: 91.4 fl (ref 78.0–100.0)
MONO ABS: 1.4 10*3/uL — AB (ref 0.1–1.0)
Monocytes Relative: 17.5 % — ABNORMAL HIGH (ref 3.0–12.0)
Neutro Abs: 4.1 10*3/uL (ref 1.4–7.7)
Neutrophils Relative %: 51.4 % (ref 43.0–77.0)
PLATELETS: 206 10*3/uL (ref 150.0–400.0)
RBC: 4.93 Mil/uL (ref 3.87–5.11)
RDW: 15 % (ref 11.5–15.5)
WBC: 7.9 10*3/uL (ref 4.0–10.5)

## 2016-11-08 LAB — BASIC METABOLIC PANEL
BUN: 18 mg/dL (ref 6–23)
CALCIUM: 10.5 mg/dL (ref 8.4–10.5)
CO2: 25 mEq/L (ref 19–32)
Chloride: 101 mEq/L (ref 96–112)
Creatinine, Ser: 0.91 mg/dL (ref 0.40–1.20)
GFR: 63.44 mL/min (ref >60.00)
GLUCOSE: 113 mg/dL — AB (ref 70–99)
POTASSIUM: 4.9 meq/L (ref 3.5–5.1)
Sodium: 136 mEq/L (ref 135–145)

## 2016-11-09 DIAGNOSIS — D649 Anemia, unspecified: Secondary | ICD-10-CM | POA: Diagnosis not present

## 2016-11-09 DIAGNOSIS — I10 Essential (primary) hypertension: Secondary | ICD-10-CM | POA: Diagnosis not present

## 2016-11-09 DIAGNOSIS — S72301D Unspecified fracture of shaft of right femur, subsequent encounter for closed fracture with routine healing: Secondary | ICD-10-CM | POA: Diagnosis not present

## 2016-11-09 DIAGNOSIS — I4891 Unspecified atrial fibrillation: Secondary | ICD-10-CM | POA: Diagnosis not present

## 2016-11-09 DIAGNOSIS — M62838 Other muscle spasm: Secondary | ICD-10-CM | POA: Diagnosis not present

## 2016-11-09 DIAGNOSIS — E119 Type 2 diabetes mellitus without complications: Secondary | ICD-10-CM | POA: Diagnosis not present

## 2016-11-10 ENCOUNTER — Telehealth: Payer: Self-pay | Admitting: Internal Medicine

## 2016-11-10 DIAGNOSIS — Z0279 Encounter for issue of other medical certificate: Secondary | ICD-10-CM

## 2016-11-10 DIAGNOSIS — R55 Syncope and collapse: Secondary | ICD-10-CM | POA: Insufficient documentation

## 2016-11-10 DIAGNOSIS — S72301D Unspecified fracture of shaft of right femur, subsequent encounter for closed fracture with routine healing: Secondary | ICD-10-CM | POA: Diagnosis not present

## 2016-11-10 DIAGNOSIS — E119 Type 2 diabetes mellitus without complications: Secondary | ICD-10-CM | POA: Diagnosis not present

## 2016-11-10 DIAGNOSIS — M62838 Other muscle spasm: Secondary | ICD-10-CM | POA: Diagnosis not present

## 2016-11-10 DIAGNOSIS — I10 Essential (primary) hypertension: Secondary | ICD-10-CM | POA: Diagnosis not present

## 2016-11-10 DIAGNOSIS — I4891 Unspecified atrial fibrillation: Secondary | ICD-10-CM | POA: Diagnosis not present

## 2016-11-10 DIAGNOSIS — D649 Anemia, unspecified: Secondary | ICD-10-CM | POA: Diagnosis not present

## 2016-11-10 NOTE — Assessment & Plan Note (Signed)
Sugars have been doing well.  Continue to follow met b and a1c.  No problems with low sugars.

## 2016-11-10 NOTE — Telephone Encounter (Signed)
Please advise I have not seen PPW

## 2016-11-10 NOTE — Telephone Encounter (Signed)
Pt called looking for an update on some paperwork for a lift chair and appt. Please advise, thank you!  Call pt @ [phone]

## 2016-11-10 NOTE — Telephone Encounter (Signed)
Will be ready by the beginning of next week.  Will call for pick up.

## 2016-11-10 NOTE — Assessment & Plan Note (Signed)
Has resolved.  Off prednisone.

## 2016-11-10 NOTE — Assessment & Plan Note (Signed)
Blood pressure doing better.  Continue same medication regimen.  Follow pressures.  Follow metabolic panel.   

## 2016-11-10 NOTE — Assessment & Plan Note (Signed)
Recent admission as outlined.  Hydrated.  Doing better.  Recheck met b and cbc.

## 2016-11-13 NOTE — Telephone Encounter (Signed)
Patient informed we will call when forms are done.

## 2016-11-14 DIAGNOSIS — I10 Essential (primary) hypertension: Secondary | ICD-10-CM | POA: Diagnosis not present

## 2016-11-14 DIAGNOSIS — I4891 Unspecified atrial fibrillation: Secondary | ICD-10-CM | POA: Diagnosis not present

## 2016-11-14 DIAGNOSIS — D649 Anemia, unspecified: Secondary | ICD-10-CM | POA: Diagnosis not present

## 2016-11-14 DIAGNOSIS — S72301D Unspecified fracture of shaft of right femur, subsequent encounter for closed fracture with routine healing: Secondary | ICD-10-CM | POA: Diagnosis not present

## 2016-11-14 DIAGNOSIS — M62838 Other muscle spasm: Secondary | ICD-10-CM | POA: Diagnosis not present

## 2016-11-14 DIAGNOSIS — E119 Type 2 diabetes mellitus without complications: Secondary | ICD-10-CM | POA: Diagnosis not present

## 2016-11-16 DIAGNOSIS — S72301D Unspecified fracture of shaft of right femur, subsequent encounter for closed fracture with routine healing: Secondary | ICD-10-CM | POA: Diagnosis not present

## 2016-11-16 DIAGNOSIS — I10 Essential (primary) hypertension: Secondary | ICD-10-CM | POA: Diagnosis not present

## 2016-11-16 DIAGNOSIS — D649 Anemia, unspecified: Secondary | ICD-10-CM | POA: Diagnosis not present

## 2016-11-16 DIAGNOSIS — E119 Type 2 diabetes mellitus without complications: Secondary | ICD-10-CM | POA: Diagnosis not present

## 2016-11-16 DIAGNOSIS — M62838 Other muscle spasm: Secondary | ICD-10-CM | POA: Diagnosis not present

## 2016-11-16 DIAGNOSIS — I4891 Unspecified atrial fibrillation: Secondary | ICD-10-CM | POA: Diagnosis not present

## 2016-11-16 NOTE — Telephone Encounter (Signed)
Pt informed will pick up forms Tuesday for lab appt

## 2016-11-17 ENCOUNTER — Other Ambulatory Visit: Payer: Medicare Other

## 2016-11-21 ENCOUNTER — Telehealth: Payer: Self-pay | Admitting: Internal Medicine

## 2016-11-21 ENCOUNTER — Other Ambulatory Visit (INDEPENDENT_AMBULATORY_CARE_PROVIDER_SITE_OTHER): Payer: Medicare Other

## 2016-11-21 DIAGNOSIS — Z7901 Long term (current) use of anticoagulants: Secondary | ICD-10-CM | POA: Diagnosis not present

## 2016-11-21 DIAGNOSIS — S72301D Unspecified fracture of shaft of right femur, subsequent encounter for closed fracture with routine healing: Secondary | ICD-10-CM | POA: Diagnosis not present

## 2016-11-21 DIAGNOSIS — I4891 Unspecified atrial fibrillation: Secondary | ICD-10-CM | POA: Diagnosis not present

## 2016-11-21 DIAGNOSIS — M62838 Other muscle spasm: Secondary | ICD-10-CM | POA: Diagnosis not present

## 2016-11-21 DIAGNOSIS — D649 Anemia, unspecified: Secondary | ICD-10-CM | POA: Diagnosis not present

## 2016-11-21 DIAGNOSIS — I10 Essential (primary) hypertension: Secondary | ICD-10-CM | POA: Diagnosis not present

## 2016-11-21 DIAGNOSIS — E119 Type 2 diabetes mellitus without complications: Secondary | ICD-10-CM | POA: Diagnosis not present

## 2016-11-21 LAB — PROTIME-INR
INR: 2.7 ratio — ABNORMAL HIGH (ref 0.8–1.0)
Prothrombin Time: 28.5 s — ABNORMAL HIGH (ref 9.6–13.1)

## 2016-11-21 NOTE — Telephone Encounter (Signed)
Pt stated that she needs a letter to Physical Therapy  stating that she can now drive and needs it by 11/23/16. Please advise

## 2016-11-21 NOTE — Telephone Encounter (Signed)
Need to know from physical therapy how she is getting around and doing and if they feel ready to go out on her own.  Please contact PT and get info.  Thanks

## 2016-11-21 NOTE — Telephone Encounter (Signed)
Did not see where this was addressed at last o/v. Is there any more information that I need to get from patient for you?

## 2016-11-21 NOTE — Telephone Encounter (Signed)
Called patient and PT was at her home now. Spoke to [place] and states that patient has walked 574ft unassisted and thinks that she will be fine with driving.

## 2016-11-22 NOTE — Telephone Encounter (Signed)
Per patient request called and left detailed message with information

## 2016-11-22 NOTE — Telephone Encounter (Signed)
From physical therapy assessment, she is getting around better.  Confirm she has not had any further problems.  We just wanted to confirm she was physically stronger and doing well with ambulation and strength in legs, etc.  If doing ok, ok to resume driving.  Any problems, let us know.

## 2016-11-22 NOTE — Telephone Encounter (Signed)
See if she can come in today for f/u appt and will d/w her more then.  Have her come in at 12:30.  Thanks

## 2016-11-22 NOTE — Telephone Encounter (Signed)
Called patient states that she did not need any letter that daughter just wanted her to check when she came in for labs to see if she can start driving. Patient would like for [place] to leave answer on v/m if she is not at home.

## 2016-11-23 ENCOUNTER — Other Ambulatory Visit: Payer: Self-pay | Admitting: Radiology

## 2016-11-23 ENCOUNTER — Telehealth: Payer: Self-pay

## 2016-11-23 DIAGNOSIS — I10 Essential (primary) hypertension: Secondary | ICD-10-CM | POA: Diagnosis not present

## 2016-11-23 DIAGNOSIS — I4891 Unspecified atrial fibrillation: Secondary | ICD-10-CM | POA: Diagnosis not present

## 2016-11-23 DIAGNOSIS — D649 Anemia, unspecified: Secondary | ICD-10-CM | POA: Diagnosis not present

## 2016-11-23 DIAGNOSIS — M62838 Other muscle spasm: Secondary | ICD-10-CM | POA: Diagnosis not present

## 2016-11-23 DIAGNOSIS — Z5181 Encounter for therapeutic drug level monitoring: Secondary | ICD-10-CM

## 2016-11-23 DIAGNOSIS — E119 Type 2 diabetes mellitus without complications: Secondary | ICD-10-CM | POA: Diagnosis not present

## 2016-11-23 DIAGNOSIS — S72301D Unspecified fracture of shaft of right femur, subsequent encounter for closed fracture with routine healing: Secondary | ICD-10-CM | POA: Diagnosis not present

## 2016-11-23 DIAGNOSIS — Z7901 Long term (current) use of anticoagulants: Principal | ICD-10-CM

## 2016-11-23 NOTE — Telephone Encounter (Signed)
Home health certification received. Placed in red folder

## 2016-11-24 DIAGNOSIS — M62838 Other muscle spasm: Secondary | ICD-10-CM | POA: Diagnosis not present

## 2016-11-24 DIAGNOSIS — I10 Essential (primary) hypertension: Secondary | ICD-10-CM | POA: Diagnosis not present

## 2016-11-24 DIAGNOSIS — E119 Type 2 diabetes mellitus without complications: Secondary | ICD-10-CM | POA: Diagnosis not present

## 2016-11-24 NOTE — Telephone Encounter (Signed)
L/m to call office on daughters number.

## 2016-11-24 NOTE — Telephone Encounter (Signed)
Form faxed copy sent to charge.

## 2016-11-24 NOTE — Telephone Encounter (Signed)
Corrections made and form signed and placed in box.

## 2016-11-24 NOTE — Telephone Encounter (Signed)
Reviewed.  Placed in box.  Need to confirm with pt (or may need to confirm with pts daughter) if Anita Walker is still taking pain medication (hydrocodone) or muscle relaxer (methocarbamol).  I would think she is off of this.  Need to confirm.

## 2016-11-24 NOTE — Telephone Encounter (Signed)
Spoke to daughter she is not on meds anymore ppw put in red folder for corrections.

## 2016-11-28 NOTE — Telephone Encounter (Signed)
Form sent back from [employer] to have more filled out. I have put in red folder.

## 2016-11-29 ENCOUNTER — Ambulatory Visit
Admission: RE | Admit: 2016-11-29 | Discharge: 2016-11-29 | Disposition: A | Discharge status: Home / Self Care | Payer: Medicare Other | Source: Ambulatory Visit | Attending: Internal Medicine | Admitting: Internal Medicine

## 2016-11-29 ENCOUNTER — Other Ambulatory Visit: Payer: Self-pay | Admitting: Internal Medicine

## 2016-11-29 DIAGNOSIS — Z1231 Encounter for screening mammogram for malignant neoplasm of breast: Secondary | ICD-10-CM | POA: Diagnosis not present

## 2016-11-29 DIAGNOSIS — Z1239 Encounter for other screening for malignant neoplasm of breast: Secondary | ICD-10-CM

## 2016-11-29 NOTE — Telephone Encounter (Signed)
Form revised and initialed.  Placed in box.

## 2016-11-29 NOTE — Telephone Encounter (Signed)
Faxed to number on form

## 2016-12-01 ENCOUNTER — Other Ambulatory Visit (INDEPENDENT_AMBULATORY_CARE_PROVIDER_SITE_OTHER): Payer: Medicare Other

## 2016-12-01 DIAGNOSIS — Z5181 Encounter for therapeutic drug level monitoring: Secondary | ICD-10-CM

## 2016-12-01 DIAGNOSIS — Z7901 Long term (current) use of anticoagulants: Secondary | ICD-10-CM | POA: Diagnosis not present

## 2016-12-01 LAB — PROTIME-INR
INR: 3.2 ratio — ABNORMAL HIGH (ref 0.8–1.0)
PROTHROMBIN TIME: 34.6 s — AB (ref 9.6–13.1)

## 2016-12-05 ENCOUNTER — Other Ambulatory Visit: Payer: Self-pay | Admitting: Radiology

## 2016-12-05 DIAGNOSIS — Z7901 Long term (current) use of anticoagulants: Principal | ICD-10-CM

## 2016-12-05 DIAGNOSIS — Z5181 Encounter for therapeutic drug level monitoring: Secondary | ICD-10-CM

## 2016-12-06 ENCOUNTER — Other Ambulatory Visit (INDEPENDENT_AMBULATORY_CARE_PROVIDER_SITE_OTHER): Payer: Medicare Other

## 2016-12-06 DIAGNOSIS — Z7901 Long term (current) use of anticoagulants: Secondary | ICD-10-CM | POA: Diagnosis not present

## 2016-12-06 DIAGNOSIS — Z5181 Encounter for therapeutic drug level monitoring: Secondary | ICD-10-CM

## 2016-12-06 LAB — PROTIME-INR
INR: 3 ratio — ABNORMAL HIGH (ref 0.8–1.0)
Prothrombin Time: 32.5 s — ABNORMAL HIGH (ref 9.6–13.1)

## 2016-12-07 NOTE — Telephone Encounter (Signed)
Form received back needs lave begin date and leave end date. In 4b

## 2016-12-08 NOTE — Telephone Encounter (Signed)
This is her FMLA form for Ms Pooley's daughter.  They are wanting me to give a leave end date.  Begin date is documented.  What I previously wrote was the leave end date - Lifelong - Ms Burton's mother will continue to need intermittent help with ADLs and help with transportation to MD appts.  They are not accepting this.  Please call Monical Murrill (leave specialist) who sent [place] the form and let her know I cannot give an exact date that Ms Burton's mother would not need assistance.  Please ask her if she can help with this documentation.  Her phone number and extension on front of packet.  Let me know if any questions.

## 2016-12-14 ENCOUNTER — Other Ambulatory Visit: Payer: Self-pay | Admitting: Radiology

## 2016-12-14 DIAGNOSIS — Z7901 Long term (current) use of anticoagulants: Principal | ICD-10-CM

## 2016-12-14 DIAGNOSIS — Z5181 Encounter for therapeutic drug level monitoring: Secondary | ICD-10-CM

## 2016-12-14 NOTE — Telephone Encounter (Signed)
Please call pt's daughter [name] Pt contact [phone]

## 2016-12-15 ENCOUNTER — Telehealth: Payer: Self-pay | Admitting: Internal Medicine

## 2016-12-15 ENCOUNTER — Other Ambulatory Visit (INDEPENDENT_AMBULATORY_CARE_PROVIDER_SITE_OTHER): Payer: Medicare Other

## 2016-12-15 DIAGNOSIS — Z7901 Long term (current) use of anticoagulants: Secondary | ICD-10-CM | POA: Diagnosis not present

## 2016-12-15 DIAGNOSIS — Z5181 Encounter for therapeutic drug level monitoring: Secondary | ICD-10-CM | POA: Diagnosis not present

## 2016-12-15 LAB — PROTIME-INR
INR: 3.2 ratio — AB (ref 0.8–1.0)
Prothrombin Time: 33.8 s — ABNORMAL HIGH (ref 9.6–13.1)

## 2016-12-15 NOTE — Telephone Encounter (Signed)
PT dropped off a Pre-Op lab order that needs to be done before Oct. 22 nd surgery. Request for lab orders form is up front in Dr. Bary Leriche color folder.

## 2016-12-15 NOTE — Telephone Encounter (Signed)
Spoke to daughter states that she they would like to have end date of one year from date filled out and frequency. Ok to fill out on form and fax.

## 2016-12-15 NOTE — Telephone Encounter (Signed)
Form put in your red folder.

## 2016-12-16 ENCOUNTER — Other Ambulatory Visit: Payer: Self-pay | Admitting: Internal Medicine

## 2016-12-16 DIAGNOSIS — Z7901 Long term (current) use of anticoagulants: Secondary | ICD-10-CM

## 2016-12-16 NOTE — Progress Notes (Signed)
Order placed for PT/INR to be checked.

## 2016-12-18 ENCOUNTER — Ambulatory Visit (INDEPENDENT_AMBULATORY_CARE_PROVIDER_SITE_OTHER): Payer: Medicare Other | Admitting: Podiatry

## 2016-12-18 DIAGNOSIS — E119 Type 2 diabetes mellitus without complications: Secondary | ICD-10-CM

## 2016-12-18 DIAGNOSIS — M79609 Pain in unspecified limb: Secondary | ICD-10-CM | POA: Diagnosis not present

## 2016-12-18 DIAGNOSIS — E1142 Type 2 diabetes mellitus with diabetic polyneuropathy: Secondary | ICD-10-CM

## 2016-12-18 DIAGNOSIS — D689 Coagulation defect, unspecified: Secondary | ICD-10-CM

## 2016-12-18 DIAGNOSIS — B351 Tinea unguium: Secondary | ICD-10-CM

## 2016-12-18 NOTE — Progress Notes (Signed)
Complaint:  Visit Type: Patient returns to my office for continued preventative foot care services. Complaint: Patient states" my nails have grown long and thick and become painful to walk and wear shoes" Patient has been diagnosed with DM with neuropathy. The patient presents for preventative foot care services. No changes to ROS.  Patient is taking coumadin.  Podiatric Exam: Vascular: dorsalis pedis  are palpable bilateral. Posterior tibial pulses are non palpable due to ankle/leg swelling. Capillary return is immediate. Temperature gradient is WNL. Skin turgor WNL  Sensorium: Diminished  Semmes Weinstein monofilament test. Normal tactile sensation bilaterally. Nail Exam: Pt has thick disfigured discolored nails with subungual debris noted bilateral entire nail hallux through fifth toenails Ulcer Exam: There is no evidence of ulcer or pre-ulcerative changes or infection. Orthopedic Exam: Muscle tone and strength are WNL. No limitations in general ROM. No crepitus or effusions noted. Foot type and digits show no abnormalities. Bony prominences are unremarkable. Skin: No Porokeratosis. No infection or ulcers  Diagnosis:  Onychomycosis, , Pain in right toe, pain in left toes  Treatment & Plan Procedures and Treatment: Consent by patient was obtained for treatment procedures. The patient understood the discussion of treatment and procedures well. All questions were answered thoroughly reviewed. Debridement of mycotic and hypertrophic toenails, 1 through 5 bilateral and clearing of subungual debris. No ulceration, no infection noted. Signed ABN for 2018. Return Visit-Office Procedure: Patient instructed to return to the office for a follow up visit 3 months for continued evaluation and treatment.    Gardiner Barefoot DPM

## 2016-12-18 NOTE — Telephone Encounter (Signed)
Form completed and placed in box.  

## 2016-12-19 NOTE — Telephone Encounter (Signed)
Faxed

## 2016-12-21 ENCOUNTER — Other Ambulatory Visit (INDEPENDENT_AMBULATORY_CARE_PROVIDER_SITE_OTHER): Payer: Medicare Other

## 2016-12-21 ENCOUNTER — Telehealth: Payer: Self-pay | Admitting: *Deleted

## 2016-12-21 ENCOUNTER — Other Ambulatory Visit: Payer: Self-pay

## 2016-12-21 DIAGNOSIS — Z5181 Encounter for therapeutic drug level monitoring: Secondary | ICD-10-CM | POA: Diagnosis not present

## 2016-12-21 DIAGNOSIS — Z7901 Long term (current) use of anticoagulants: Secondary | ICD-10-CM

## 2016-12-21 LAB — PROTIME-INR
INR: 2.6 ratio — AB (ref 0.8–1.0)
PROTHROMBIN TIME: 28.3 s — AB (ref 9.6–13.1)

## 2016-12-21 MED ORDER — GLUCOSE BLOOD VI STRP: ORAL_STRIP | 12 refills | Status: DC

## 2016-12-21 NOTE — Telephone Encounter (Signed)
Called patient and let know that has been called into [facility] [place]. Regular pharmacy does not contract with Medicare B

## 2016-12-21 NOTE — Telephone Encounter (Signed)
Pt states that she needs test strips sent to [place] soon before she runs out.  Thanks

## 2016-12-22 NOTE — Telephone Encounter (Signed)
Pt called back I gave pt message about her medication. Thank you!

## 2016-12-25 ENCOUNTER — Encounter: Payer: Self-pay | Admitting: *Deleted

## 2016-12-26 ENCOUNTER — Encounter
Admission: RE | Disposition: A | Discharge status: Home / Self Care | Payer: Self-pay | Source: Ambulatory Visit | Attending: Ophthalmology

## 2016-12-26 ENCOUNTER — Ambulatory Visit
Admission: RE | Admit: 2016-12-26 | Discharge: 2016-12-26 | Disposition: A | Discharge status: Home / Self Care | Payer: Medicare Other | Source: Ambulatory Visit | Attending: Ophthalmology | Admitting: Ophthalmology

## 2016-12-26 ENCOUNTER — Encounter: Payer: Self-pay | Admitting: *Deleted

## 2016-12-26 ENCOUNTER — Ambulatory Visit: Payer: Medicare Other | Admitting: Anesthesiology

## 2016-12-26 DIAGNOSIS — M1990 Unspecified osteoarthritis, unspecified site: Secondary | ICD-10-CM | POA: Diagnosis not present

## 2016-12-26 DIAGNOSIS — Z7901 Long term (current) use of anticoagulants: Secondary | ICD-10-CM | POA: Diagnosis not present

## 2016-12-26 DIAGNOSIS — Z8711 Personal history of peptic ulcer disease: Secondary | ICD-10-CM | POA: Insufficient documentation

## 2016-12-26 DIAGNOSIS — E7800 Pure hypercholesterolemia, unspecified: Secondary | ICD-10-CM | POA: Insufficient documentation

## 2016-12-26 DIAGNOSIS — Z880 Allergy status to penicillin: Secondary | ICD-10-CM | POA: Diagnosis not present

## 2016-12-26 DIAGNOSIS — Z79899 Other long term (current) drug therapy: Secondary | ICD-10-CM | POA: Insufficient documentation

## 2016-12-26 DIAGNOSIS — I10 Essential (primary) hypertension: Secondary | ICD-10-CM | POA: Insufficient documentation

## 2016-12-26 DIAGNOSIS — F419 Anxiety disorder, unspecified: Secondary | ICD-10-CM | POA: Diagnosis not present

## 2016-12-26 DIAGNOSIS — K219 Gastro-esophageal reflux disease without esophagitis: Secondary | ICD-10-CM | POA: Insufficient documentation

## 2016-12-26 DIAGNOSIS — H2511 Age-related nuclear cataract, right eye: Secondary | ICD-10-CM | POA: Diagnosis not present

## 2016-12-26 DIAGNOSIS — E119 Type 2 diabetes mellitus without complications: Secondary | ICD-10-CM | POA: Diagnosis not present

## 2016-12-26 HISTORY — PX: CATARACT EXTRACTION W/PHACO: SHX586

## 2016-12-26 HISTORY — DX: Edema, unspecified: R60.9

## 2016-12-26 HISTORY — DX: Unspecified hearing loss, unspecified ear: H91.90

## 2016-12-26 HISTORY — DX: Unspecified osteoarthritis, unspecified site: M19.90

## 2016-12-26 LAB — GLUCOSE, CAPILLARY: GLUCOSE-CAPILLARY: 102 mg/dL — AB (ref 65–99)

## 2016-12-26 SURGERY — PHACOEMULSIFICATION, CATARACT, WITH IOL INSERTION
Anesthesia: Monitor Anesthesia Care | Site: Eye | Laterality: Right | Wound class: Clean

## 2016-12-26 MED ORDER — MOXIFLOXACIN HCL 0.5 % OP SOLN
OPHTHALMIC | Status: DC | PRN
  Administered 2016-12-26: .2 mL via OPHTHALMIC

## 2016-12-26 MED ORDER — LIDOCAINE HCL (PF) 4 % IJ SOLN
INTRAMUSCULAR | Status: AC
  Filled 2016-12-26: qty 5

## 2016-12-26 MED ORDER — FENTANYL CITRATE (PF) 100 MCG/2ML IJ SOLN: 25.0000 ug | INTRAMUSCULAR | Status: DC | PRN

## 2016-12-26 MED ORDER — EPINEPHRINE PF 1 MG/ML IJ SOLN
INTRAMUSCULAR | Status: AC
  Filled 2016-12-26: qty 1

## 2016-12-26 MED ORDER — CARBACHOL 0.01 % IO SOLN
INTRAOCULAR | Status: DC | PRN
  Administered 2016-12-26: .5 mL via INTRAOCULAR

## 2016-12-26 MED ORDER — ONDANSETRON HCL 4 MG/2ML IJ SOLN: 4.0000 mg | Freq: Once | INTRAMUSCULAR | Status: DC | PRN

## 2016-12-26 MED ORDER — MOXIFLOXACIN HCL 0.5 % OP SOLN
OPHTHALMIC | Status: AC
  Filled 2016-12-26: qty 3

## 2016-12-26 MED ORDER — LIDOCAINE HCL (PF) 4 % IJ SOLN
INTRAOCULAR | Status: DC | PRN
  Administered 2016-12-26: 2 mL via OPHTHALMIC

## 2016-12-26 MED ORDER — NA CHONDROIT SULF-NA HYALURON 40-17 MG/ML IO SOLN
INTRAOCULAR | Status: AC
  Filled 2016-12-26: qty 1

## 2016-12-26 MED ORDER — MOXIFLOXACIN HCL 0.5 % OP SOLN: 1.0000 [drp] | OPHTHALMIC | Status: DC | PRN

## 2016-12-26 MED ORDER — POVIDONE-IODINE 5 % OP SOLN
OPHTHALMIC | Status: AC
  Filled 2016-12-26: qty 30

## 2016-12-26 MED ORDER — SODIUM CHLORIDE 0.9 % IV SOLN
INTRAVENOUS | Status: DC
  Administered 2016-12-26 (×2): via INTRAVENOUS

## 2016-12-26 MED ORDER — MIDAZOLAM HCL 2 MG/2ML IJ SOLN
INTRAMUSCULAR | Status: DC | PRN
  Administered 2016-12-26 (×2): 1 mg via INTRAVENOUS

## 2016-12-26 MED ORDER — NA CHONDROIT SULF-NA HYALURON 40-17 MG/ML IO SOLN
INTRAOCULAR | Status: DC | PRN
  Administered 2016-12-26: 1 mL via INTRAOCULAR

## 2016-12-26 MED ORDER — EPINEPHRINE PF 1 MG/ML IJ SOLN
INTRAOCULAR | Status: DC | PRN
  Administered 2016-12-26: 1 mL via OPHTHALMIC

## 2016-12-26 MED ORDER — ARMC OPHTHALMIC DILATING DROPS
1.0000 "application " | OPHTHALMIC | Status: AC
  Administered 2016-12-26 (×3): 1 via OPHTHALMIC

## 2016-12-26 MED ORDER — MIDAZOLAM HCL 2 MG/2ML IJ SOLN
INTRAMUSCULAR | Status: AC
  Filled 2016-12-26: qty 2

## 2016-12-26 MED ORDER — ARMC OPHTHALMIC DILATING DROPS
OPHTHALMIC | Status: AC
  Administered 2016-12-26: 1 via OPHTHALMIC
  Filled 2016-12-26: qty 0.4

## 2016-12-26 MED ORDER — POVIDONE-IODINE 5 % OP SOLN
OPHTHALMIC | Status: DC | PRN
  Administered 2016-12-26: 1 via OPHTHALMIC

## 2016-12-26 SURGICAL SUPPLY — 16 items
GLOVE BIO SURGEON STRL SZ8 (GLOVE) ×2 IMPLANT
GLOVE BIOGEL M 6.5 STRL (GLOVE) ×2 IMPLANT
GLOVE SURG LX 8.0 MICRO (GLOVE) ×1
GLOVE SURG LX STRL 8.0 MICRO (GLOVE) ×1 IMPLANT
GOWN STRL REUS W/ TWL LRG LVL3 (GOWN DISPOSABLE) ×2 IMPLANT
GOWN STRL REUS W/TWL LRG LVL3 (GOWN DISPOSABLE) ×2
LABEL CATARACT MEDS ST (LABEL) ×2 IMPLANT
LENS IOL TECNIS ITEC 20.5 (Intraocular Lens) ×2 IMPLANT
PACK CATARACT (MISCELLANEOUS) ×2 IMPLANT
PACK CATARACT BRASINGTON LX (MISCELLANEOUS) ×2 IMPLANT
PACK EYE AFTER SURG (MISCELLANEOUS) ×2 IMPLANT
SOL BSS BAG (MISCELLANEOUS) ×2
SOLUTION BSS BAG (MISCELLANEOUS) ×1 IMPLANT
SYR 5ML LL (SYRINGE) ×2 IMPLANT
WATER STERILE IRR 250ML POUR (IV SOLUTION) ×2 IMPLANT
WIPE NON LINTING 3.25X3.25 (MISCELLANEOUS) ×2 IMPLANT

## 2016-12-26 NOTE — Anesthesia Preprocedure Evaluation (Signed)
Anesthesia Evaluation  Patient identified by MRN, date of birth, ID band Patient awake    Reviewed: Allergy & Precautions, NPO status , Patient's Chart, lab work & pertinent test results  Airway Mallampati: II       Dental  (+) Teeth Intact   Pulmonary neg pulmonary ROS,    breath sounds clear to auscultation       Cardiovascular Exercise Tolerance: Good hypertension, Pt. on medications  Rhythm:Regular     Neuro/Psych Anxiety negative neurological ROS     GI/Hepatic Neg liver ROS, PUD, GERD  Medicated,  Endo/Other  diabetes, Type 2  Renal/GU negative Renal ROS  negative genitourinary   Musculoskeletal   Abdominal Normal abdominal exam  (+)   Peds negative pediatric ROS (+)  Hematology  (+) anemia ,   Anesthesia Other Findings   Reproductive/Obstetrics                             Anesthesia Physical Anesthesia Plan  ASA: II  Anesthesia Plan: MAC   Post-op Pain Management:    Induction: Intravenous  PONV Risk Score and Plan: 0  Airway Management Planned: Natural Airway and Nasal Cannula  Additional Equipment:   Intra-op Plan:   Post-operative Plan:   Informed Consent: I have reviewed the patients History and Physical, chart, labs and discussed the procedure including the risks, benefits and alternatives for the proposed anesthesia with the patient or authorized representative who has indicated his/her understanding and acceptance.     Plan Discussed with: CRNA  Anesthesia Plan Comments:         Anesthesia Quick Evaluation

## 2016-12-26 NOTE — Anesthesia Procedure Notes (Signed)
Date/Time: 12/26/2016 7:34 AM Performed by: Nelda Marseille Pre-anesthesia Checklist: Patient identified, Emergency Drugs available, Suction available, Patient being monitored and Timeout performed Oxygen Delivery Method: Nasal cannula

## 2016-12-26 NOTE — Discharge Instructions (Signed)
Eye Surgery Discharge Instructions  Expect mild scratchy sensation or mild soreness. DO NOT RUB YOUR EYE!  The day of surgery:  Minimal physical activity, but bed rest is not required  No reading, computer work, or close hand work  No bending, lifting, or straining.  May watch TV  For 24 hours:  No driving, legal decisions, or alcoholic beverages  Safety precautions  Eat anything you prefer: It is better to start with liquids, then soup then solid foods.  _____ Eye patch should be worn until postoperative exam tomorrow.  ____ Solar shield eyeglasses should be worn for comfort in the sunlight/patch while sleeping  Resume all regular medications including aspirin or Coumadin if these were discontinued prior to surgery. You may shower, bathe, shave, or wash your hair. Tylenol may be taken for mild discomfort.  Call your doctor if you experience significant pain, nausea, or vomiting, fever > 101 or other signs of infection. [phone] or [phone] Specific instructions:  Follow-up Information    Birder Robson, MD Follow up on 12/27/2016.   Specialty:  Ophthalmology Why:  follow up at 9:15 am in the office Contact information: [place] [address] [phone]

## 2016-12-26 NOTE — Anesthesia Post-op Follow-up Note (Signed)
Anesthesia QCDR form completed.        

## 2016-12-26 NOTE — Transfer of Care (Signed)
Immediate Anesthesia Transfer of Care Note  Patient: Anita Walker  Procedure(s) Performed: CATARACT EXTRACTION PHACO AND INTRAOCULAR LENS PLACEMENT (IOC)-RIGHT DIABETIC (Right Eye)  Patient Location: PACU  Anesthesia Type:MAC  Level of Consciousness: awake, alert  and oriented  Airway & Oxygen Therapy: Patient Spontanous Breathing and Patient connected to nasal cannula oxygen  Post-op Assessment: Report given to RN and Post -op Vital signs reviewed and stable  Post vital signs: Reviewed and stable  Last Vitals:  Vitals:   12/26/16 0619  BP: 136/67  Pulse: 72  Resp: 18  Temp: (!) 36.2 C  SpO2: 100%    Last Pain:  Vitals:   12/26/16 0619  TempSrc: Tympanic         Complications: No apparent anesthesia complications

## 2016-12-26 NOTE — Anesthesia Postprocedure Evaluation (Signed)
Anesthesia Post Note  Patient: Anita Walker  Procedure(s) Performed: CATARACT EXTRACTION PHACO AND INTRAOCULAR LENS PLACEMENT (IOC)-RIGHT DIABETIC (Right Eye)  Patient location during evaluation: PACU Anesthesia Type: MAC Level of consciousness: awake, awake and alert and oriented Pain management: pain level controlled Vital Signs Assessment: post-procedure vital signs reviewed and stable Respiratory status: spontaneous breathing, nonlabored ventilation and respiratory function stable Cardiovascular status: stable Anesthetic complications: no     Last Vitals:  Vitals:   12/26/16 0619  BP: 136/67  Pulse: 72  Resp: 18  Temp: (!) 36.2 C  SpO2: 100%    Last Pain:  Vitals:   12/26/16 0619  TempSrc: Tympanic                 Devante Capano,  [facility]

## 2016-12-26 NOTE — Op Note (Signed)
PREOPERATIVE DIAGNOSIS:  Nuclear sclerotic cataract of the right eye.   POSTOPERATIVE DIAGNOSIS:  nuclear sclerotic cataract right eye   OPERATIVE PROCEDURE: Procedure(s): CATARACT EXTRACTION PHACO AND INTRAOCULAR LENS PLACEMENT (IOC)-RIGHT DIABETIC   SURGEON:  Birder Robson, MD.   ANESTHESIA:  Anesthesiologist: [facility], Jane Canary, MD CRNA: Nelda Marseille, CRNA  1.      Managed anesthesia care. 2.      0.42ml of Shugarcaine was instilled in the eye following the paracentesis.   COMPLICATIONS:  None.   TECHNIQUE:   Stop and chop   DESCRIPTION OF PROCEDURE:  The patient was examined and consented in the preoperative holding area where the aforementioned topical anesthesia was applied to the right eye and then brought back to the Operating Room where the right eye was prepped and draped in the usual sterile ophthalmic fashion and a lid speculum was placed. A paracentesis was created with the side port blade and the anterior chamber was filled with viscoelastic. A near clear corneal incision was performed with the steel keratome. A continuous curvilinear capsulorrhexis was performed with a cystotome followed by the capsulorrhexis forceps. Hydrodissection and hydrodelineation were carried out with BSS on a blunt cannula. The lens was removed in a stop and chop  technique and the remaining cortical material was removed with the irrigation-aspiration handpiece. The capsular bag was inflated with viscoelastic and the Technis [id number]  lens was placed in the capsular bag without complication. The remaining viscoelastic was removed from the eye with the irrigation-aspiration handpiece. The wounds were hydrated. The anterior chamber was flushed with Miostat and the eye was inflated to physiologic pressure. 0.69ml of Vigamox was placed in the anterior chamber. The wounds were found to be water tight. The eye was dressed with Vigamox. The patient was given protective glasses to wear throughout the day and  a shield with which to sleep tonight. The patient was also given drops with which to begin a drop regimen today and will follow-up with me in one day.  Implant Name Type Inv. Item Serial No. Manufacturer Lot No. LRB No. Used  LENS IOL DIOP 20.5 - [id number] Intraocular Lens LENS IOL DIOP 20.5 [id number] AMO   Right 1   Procedure(s) with comments: CATARACT EXTRACTION PHACO AND INTRAOCULAR LENS PLACEMENT (IOC)-RIGHT DIABETIC (Right) - [place] 00:47 AP% 24.5 CDE 11.62 Fluid pack lot # [id number]  Electronically signed: [place] 12/26/2016 7:53 AM

## 2016-12-26 NOTE — H&P (Signed)
All labs reviewed. Abnormal studies sent to patients PCP when indicated.  Previous H&P reviewed, patient examined, there are NO CHANGES.  Anita Walker LOUIS10/23/20187:12 AM

## 2016-12-28 ENCOUNTER — Telehealth: Payer: Self-pay | Admitting: *Deleted

## 2016-12-28 DIAGNOSIS — Z7901 Long term (current) use of anticoagulants: Secondary | ICD-10-CM

## 2016-12-28 NOTE — Telephone Encounter (Signed)
Standing order placed for PT/INR 

## 2016-12-29 ENCOUNTER — Other Ambulatory Visit (INDEPENDENT_AMBULATORY_CARE_PROVIDER_SITE_OTHER): Payer: Medicare Other

## 2016-12-29 DIAGNOSIS — Z7901 Long term (current) use of anticoagulants: Secondary | ICD-10-CM

## 2016-12-29 NOTE — Addendum Note (Signed)
Addended by: Arby Barrette on: 12/29/2016 01:56 PM   Modules accepted: Orders

## 2016-12-30 LAB — PROTIME-INR
INR: 2.5 — AB
PROTHROMBIN TIME: 26.7 s — AB (ref 9.0–11.5)

## 2017-01-15 ENCOUNTER — Other Ambulatory Visit (INDEPENDENT_AMBULATORY_CARE_PROVIDER_SITE_OTHER): Payer: Medicare Other

## 2017-01-15 DIAGNOSIS — Z7901 Long term (current) use of anticoagulants: Secondary | ICD-10-CM | POA: Diagnosis not present

## 2017-01-15 LAB — PROTIME-INR
INR: 2.2 ratio — ABNORMAL HIGH (ref 0.8–1.0)
PROTHROMBIN TIME: 23.2 s — AB (ref 9.6–13.1)

## 2017-01-16 ENCOUNTER — Telehealth: Payer: Self-pay | Admitting: Internal Medicine

## 2017-01-16 ENCOUNTER — Other Ambulatory Visit: Payer: Self-pay | Admitting: Internal Medicine

## 2017-01-16 NOTE — Telephone Encounter (Signed)
Called in and was given lab results.  INR/PT results given.   Appt made for repeat PT/INR on 02/12/17 at 3:15 follow her wellness exam visit that day.

## 2017-01-17 ENCOUNTER — Telehealth: Payer: Self-pay | Admitting: Internal Medicine

## 2017-01-17 DIAGNOSIS — H2512 Age-related nuclear cataract, left eye: Secondary | ICD-10-CM | POA: Diagnosis not present

## 2017-01-17 NOTE — Telephone Encounter (Signed)
Please advise 

## 2017-01-17 NOTE — Telephone Encounter (Signed)
Copied from [place] [phone]. Topic: Quick Communication - See Telephone Encounter >> Jan 17, 2017  3:22 PM Robina Ade, Helene Kelp D wrote: CRM for notification. See Telephone encounter for:  [facility] called and would like a copy of patients PT INR sent to them via fax [phone]. Patient is having surgery and they needs that lab results. 01/17/17.

## 2017-01-18 ENCOUNTER — Encounter: Payer: Self-pay | Admitting: *Deleted

## 2017-01-18 NOTE — Telephone Encounter (Signed)
Faxed to office

## 2017-01-19 ENCOUNTER — Other Ambulatory Visit: Payer: Self-pay

## 2017-01-19 ENCOUNTER — Encounter: Payer: Self-pay | Admitting: Internal Medicine

## 2017-01-19 ENCOUNTER — Ambulatory Visit (INDEPENDENT_AMBULATORY_CARE_PROVIDER_SITE_OTHER): Payer: Medicare Other | Admitting: Internal Medicine

## 2017-01-19 DIAGNOSIS — E119 Type 2 diabetes mellitus without complications: Secondary | ICD-10-CM | POA: Diagnosis not present

## 2017-01-19 DIAGNOSIS — E7800 Pure hypercholesterolemia, unspecified: Secondary | ICD-10-CM | POA: Diagnosis not present

## 2017-01-19 DIAGNOSIS — R350 Frequency of micturition: Secondary | ICD-10-CM

## 2017-01-19 DIAGNOSIS — Z853 Personal history of malignant neoplasm of breast: Secondary | ICD-10-CM

## 2017-01-19 DIAGNOSIS — I10 Essential (primary) hypertension: Secondary | ICD-10-CM | POA: Diagnosis not present

## 2017-01-19 MED ORDER — LOSARTAN POTASSIUM 100 MG PO TABS: 100.0000 mg | ORAL_TABLET | Freq: Every day | ORAL | 2 refills | Status: DC

## 2017-01-19 NOTE — Progress Notes (Signed)
[name], female   DOB: 1940/06/11, 76 y.o.   MRN: [id number]   Subjective:    [name], female    DOB: [birth year], 76 y.o.   MRN: [id number]  HPI  Patient here for a scheduled follow up.  She reports she is doing relatively well.  Blood pressure elevated.  No headache.  No dizziness.  No chest pain.  No sob.  No acid reflux.  No abdominal pain. Bowels moving.  Does report increased urinary frequency and some nocturia.  Also reports that at times, will feel she has to go to the bathroom and only a minimal amount comes out.  Discussed further evaluation.  No vaginal discharge.     Past Medical History:  Diagnosis Date  . Anemia   . Arthritis   . Atrial fibrillation ([place])   . Breast cancer ([place])    s/p lumpectomy 1992.  s/p chemo and xrt  . Diabetes mellitus ([place])   . Edema    feet/legs  . Gastric ulcer   . GERD (gastroesophageal reflux disease)   . HOH (hard of hearing)    aides  . Hypercholesterolemia   . Hypertension   . Pulmonary emboli [facility])    Past Surgical History:  Procedure Laterality Date  . BREAST LUMPECTOMY  03/06/1990   left breast  . CATARACT EXTRACTION PHACO AND INTRAOCULAR LENS PLACEMENT ([facility])-RIGHT DIABETIC Right 12/26/2016   Performed by Birder Robson, MD at [facility] ORS   Family History  Problem Relation Age of Onset  . Heart disease Father   . Heart disease Brother        s/p CABG  . Colon cancer Neg Hx   . Breast cancer Neg Hx    Social History   Socioeconomic History  . Marital status: Married    Spouse name: None  . Number of children: None  . Years of education: None  . Highest education level: None  Social Needs  . Financial resource strain: None  . Food insecurity - worry: None  . Food insecurity - inability: None  . Transportation needs - medical: None  . Transportation needs - non-medical: None  Occupational History  . None  Tobacco Use  . Smoking status: Never Smoker  . Smokeless  tobacco: Never Used  Substance and Sexual Activity  . Alcohol use: No    Alcohol/week: 0.0 oz  . Drug use: No  . Sexual activity: None  Other Topics Concern  . None  Social History Narrative  . None    Outpatient Encounter Medications as of 01/19/2017  Medication Sig  . acetaminophen (TYLENOL) 500 MG tablet Take 500 mg by mouth 2 (two) times daily as needed for mild pain.  . bisacodyl (DULCOLAX) 5 MG EC tablet Take 5 mg daily as needed by mouth for mild constipation or moderate constipation.  . carbamide peroxide (DEBROX) 6.5 % OTIC solution 4 drops in the right ear daily.  Massage for approximately 5 minutes.  . Difluprednate (DUREZOL) 0.05 % EMUL Place 1 drop 2 (two) times daily into the right eye.  . diphenhydrAMINE (BENADRYL) 25 mg capsule Take 25 mg at bedtime as needed by mouth for sleep.   . ferrous sulfate 325 (65 FE) MG tablet TAKE ONE (1) TABLET BY MOUTH EVERY DAY (Patient taking differently: Take 325 mg daily with supper by mouth. )  . glucose blood (BAYER CONTOUR TEST) test strip Check one time day  Dx E11.9  . Multiple Vitamin (MULTIVITAMIN  WITH MINERALS) TABS tablet Take 1 tablet daily by mouth.  . nepafenac (ILEVRO) 0.3 % ophthalmic suspension Place 1 drop daily into the right eye.  . pantoprazole (PROTONIX) 40 MG tablet Take 1 tablet (40 mg total) by mouth daily. (Patient taking differently: Take 40 mg by mouth every other day. )  . simvastatin (ZOCOR) 10 MG tablet TAKE ONE (1) TABLET BY MOUTH EVERY DAY (Patient taking differently: Take 10 mg daily at 6 PM by mouth. )  . warfarin (COUMADIN) 2.5 MG tablet TAKE 2 TABLETS BY MOUTH ON TUESDAY, THURSDAY, SATURDAY, AND SUNDAY. TAKE 1 TABLET ON ALL OTHER DAYS (Patient taking differently: Take 2.5-5 mg See admin instructions by mouth. Takes 2 tablets (5 mg) on Tuesdays and Thursdays and 1 tablet (2.5 mg)  all other days of the week)  . [DISCONTINUED] HYDROcodone-acetaminophen (NORCO/VICODIN) 5-325 MG tablet Take 1-2 tablets by  mouth every 6 (six) hours as needed for moderate pain or severe pain.  . [DISCONTINUED] losartan (COZAAR) 50 MG tablet TAKE ONE (1) TABLET EACH DAY (Patient taking differently: Take 50 mg daily by mouth. )  . [DISCONTINUED] methocarbamol (ROBAXIN) 500 MG tablet Take 1 tablet (500 mg total) by mouth every 8 (eight) hours as needed for muscle spasms.  Marland Kitchen losartan (COZAAR) 100 MG tablet Take 1 tablet (100 mg total) daily by mouth.   No facility-administered encounter medications on file as of 01/19/2017.     Review of Systems  Constitutional: Negative for appetite change and unexpected weight change.  HENT: Negative for congestion and sinus pressure.   Respiratory: Negative for cough, chest tightness and shortness of breath.   Cardiovascular: Negative for chest pain, palpitations and leg swelling.  Gastrointestinal: Negative for abdominal pain, diarrhea, nausea and vomiting.  Genitourinary: Positive for frequency. Negative for difficulty urinating and dysuria.  Musculoskeletal: Negative for back pain and joint swelling.  Skin: Negative for color change and rash.  Neurological: Negative for dizziness, light-headedness and headaches.  Psychiatric/Behavioral: Negative for agitation and dysphoric mood.       Objective:    Physical Exam  Constitutional: She appears well-developed and well-nourished. No distress.  HENT:  Nose: Nose normal.  Mouth/Throat: Oropharynx is clear and moist.  Neck: Neck supple. No thyromegaly present.  Cardiovascular: Normal rate and regular rhythm.  Pulmonary/Chest: Breath sounds normal. No respiratory distress. She has no wheezes.  Abdominal: Soft. Bowel sounds are normal. There is no tenderness.  Musculoskeletal: She exhibits no edema or tenderness.  Lymphadenopathy:    She has no cervical adenopathy.  Skin: No rash noted. No erythema.  Psychiatric: She has a normal mood and affect. Her behavior is normal.    BP 140/78 (BP Location: Left Arm, Patient  Position: Sitting, Cuff Size: Large)   Pulse 82   Temp (!) 97.5 F (36.4 C) (Oral)   Resp 14   Wt 243 lb 3.2 oz (110.3 kg)   SpO2 94%   BMI 35.91 kg/m  Wt Readings from Last 3 Encounters:  01/19/17 243 lb 3.2 oz (110.3 kg)  12/26/16 237 lb (107.5 kg)  11/07/16 237 lb 3.2 oz (107.6 kg)     Lab Results  Component Value Date   WBC 7.9 11/07/2016   HGB 15.0 11/07/2016   HCT 45.0 11/07/2016   PLT 206.0 11/07/2016   GLUCOSE 113 (H) 11/07/2016   CHOL 131 09/15/2016   TRIG 104.0 09/15/2016   HDL 49.00 09/15/2016   LDLCALC 61 09/15/2016   ALT 19 10/30/2016   AST 22 10/30/2016  NA 136 11/07/2016   K 4.9 11/07/2016   CL 101 11/07/2016   CREATININE 0.91 11/07/2016   BUN 18 11/07/2016   CO2 25 11/07/2016   TSH 2.55 01/21/2016   INR 2.2 (H) 01/15/2017   HGBA1C 5.8 09/08/2016   MICROALBUR <0.7 01/21/2016    Mm Screening Breast Tomo Bilateral  Result Date: 11/30/2016 CLINICAL DATA:  Screening. EXAM: 2D DIGITAL SCREENING BILATERAL MAMMOGRAM WITH CAD AND ADJUNCT TOMO COMPARISON:  Previous exam(s). ACR Breast Density Category c: The breast tissue is heterogeneously dense, which may obscure small masses. FINDINGS: There are no findings suspicious for malignancy. Images were processed with CAD. IMPRESSION: No mammographic evidence of malignancy. A result letter of this screening mammogram will be mailed directly to the patient. RECOMMENDATION: Screening mammogram in one year. (Code:SM-B-01Y) BI-RADS CATEGORY  1: Negative. Electronically Signed   By: Pamelia Hoit M.D.   On: 11/30/2016 07:52       Assessment & Plan:   Problem List Items Addressed This Visit    History of breast cancer    Mammogram 11/30/16 - birads I.        Hypercholesteremia    On simvastatin.  Low cholesterol diet and exercise.  Follow lipid panel and liver function tests.        Relevant Medications   losartan (COZAAR) 100 MG tablet   Hypertension    Blood pressure remains elevated.  Increase losartan to 124m  q day.  Follow pressures.  Get her back in soon to reassess.  Follow metabolic panel.  Follow pressures.        Relevant Medications   losartan (COZAAR) 100 MG tablet   Type 2 diabetes mellitus without complications (HCC)    Low carb diet and exercise.  Follow met b and a1c.        Relevant Medications   losartan (COZAAR) 100 MG tablet   Urinary frequency    Describes urinary frequency.  Also describes at times will feels she has to go and only small amounts produced.  Discussed treatment options and evaluation.  Will refer to urology for further evaluation.        Relevant Orders   Ambulatory referral to Urology       SEinar Pheasant MD

## 2017-01-19 NOTE — Patient Instructions (Signed)
Change losartan to 100mg  per day.  (currently taking 50mg ).

## 2017-01-21 ENCOUNTER — Encounter: Payer: Self-pay | Admitting: Internal Medicine

## 2017-01-21 DIAGNOSIS — R350 Frequency of micturition: Secondary | ICD-10-CM | POA: Insufficient documentation

## 2017-01-21 NOTE — Assessment & Plan Note (Signed)
On simvastatin.  Low cholesterol diet and exercise.  Follow lipid panel and liver function tests.   

## 2017-01-21 NOTE — Assessment & Plan Note (Signed)
Low carb diet and exercise.  Follow met b and a1c.   

## 2017-01-21 NOTE — Assessment & Plan Note (Signed)
Describes urinary frequency.  Also describes at times will feels she has to go and only small amounts produced.  Discussed treatment options and evaluation.  Will refer to urology for further evaluation.

## 2017-01-21 NOTE — Assessment & Plan Note (Signed)
Blood pressure remains elevated.  Increase losartan to 100mg  q day.  Follow pressures.  Get her back in soon to reassess.  Follow metabolic panel.  Follow pressures.

## 2017-01-21 NOTE — Assessment & Plan Note (Signed)
Mammogram 11/30/16 - birads I.

## 2017-01-23 ENCOUNTER — Ambulatory Visit: Payer: Medicare Other | Admitting: Anesthesiology

## 2017-01-23 ENCOUNTER — Encounter: Payer: Self-pay | Admitting: *Deleted

## 2017-01-23 ENCOUNTER — Ambulatory Visit
Admission: RE | Admit: 2017-01-23 | Discharge: 2017-01-23 | Disposition: A | Discharge status: Home / Self Care | Payer: Medicare Other | Source: Ambulatory Visit | Attending: Ophthalmology | Admitting: Ophthalmology

## 2017-01-23 ENCOUNTER — Encounter
Admission: RE | Disposition: A | Discharge status: Home / Self Care | Payer: Self-pay | Source: Ambulatory Visit | Attending: Ophthalmology

## 2017-01-23 ENCOUNTER — Other Ambulatory Visit: Payer: Self-pay

## 2017-01-23 DIAGNOSIS — Z79899 Other long term (current) drug therapy: Secondary | ICD-10-CM | POA: Insufficient documentation

## 2017-01-23 DIAGNOSIS — E669 Obesity, unspecified: Secondary | ICD-10-CM | POA: Insufficient documentation

## 2017-01-23 DIAGNOSIS — D649 Anemia, unspecified: Secondary | ICD-10-CM | POA: Diagnosis not present

## 2017-01-23 DIAGNOSIS — H2512 Age-related nuclear cataract, left eye: Secondary | ICD-10-CM | POA: Insufficient documentation

## 2017-01-23 DIAGNOSIS — K219 Gastro-esophageal reflux disease without esophagitis: Secondary | ICD-10-CM | POA: Diagnosis not present

## 2017-01-23 DIAGNOSIS — E119 Type 2 diabetes mellitus without complications: Secondary | ICD-10-CM | POA: Insufficient documentation

## 2017-01-23 DIAGNOSIS — Z791 Long term (current) use of non-steroidal anti-inflammatories (NSAID): Secondary | ICD-10-CM | POA: Diagnosis not present

## 2017-01-23 DIAGNOSIS — E7800 Pure hypercholesterolemia, unspecified: Secondary | ICD-10-CM | POA: Diagnosis not present

## 2017-01-23 DIAGNOSIS — I10 Essential (primary) hypertension: Secondary | ICD-10-CM | POA: Diagnosis not present

## 2017-01-23 HISTORY — PX: CATARACT EXTRACTION W/PHACO: SHX586

## 2017-01-23 LAB — GLUCOSE, CAPILLARY: Glucose-Capillary: 105 mg/dL — ABNORMAL HIGH (ref 65–99)

## 2017-01-23 SURGERY — PHACOEMULSIFICATION, CATARACT, WITH IOL INSERTION
Anesthesia: Monitor Anesthesia Care | Site: Eye | Laterality: Left | Wound class: Clean

## 2017-01-23 MED ORDER — EPINEPHRINE PF 1 MG/ML IJ SOLN
INTRAMUSCULAR | Status: AC
  Filled 2017-01-23: qty 1

## 2017-01-23 MED ORDER — ONDANSETRON HCL 4 MG/2ML IJ SOLN
INTRAMUSCULAR | Status: DC | PRN
  Administered 2017-01-23: 4 mg via INTRAVENOUS

## 2017-01-23 MED ORDER — ARMC OPHTHALMIC DILATING DROPS
1.0000 "application " | OPHTHALMIC | Status: AC
  Administered 2017-01-23 (×3): 1 via OPHTHALMIC

## 2017-01-23 MED ORDER — SODIUM CHLORIDE 0.9 % IV SOLN
INTRAVENOUS | Status: DC
  Administered 2017-01-23: 06:00:00 via INTRAVENOUS

## 2017-01-23 MED ORDER — MOXIFLOXACIN HCL 0.5 % OP SOLN
OPHTHALMIC | Status: AC
  Filled 2017-01-23: qty 3

## 2017-01-23 MED ORDER — EPINEPHRINE PF 1 MG/ML IJ SOLN
INTRAOCULAR | Status: DC | PRN
  Administered 2017-01-23: 1 mL via OPHTHALMIC

## 2017-01-23 MED ORDER — LIDOCAINE HCL (PF) 4 % IJ SOLN
INTRAMUSCULAR | Status: DC | PRN
  Administered 2017-01-23: 2 mL via OPHTHALMIC

## 2017-01-23 MED ORDER — FENTANYL CITRATE (PF) 100 MCG/2ML IJ SOLN
INTRAMUSCULAR | Status: DC | PRN
  Administered 2017-01-23 (×2): 25 ug via INTRAVENOUS

## 2017-01-23 MED ORDER — POVIDONE-IODINE 5 % OP SOLN
OPHTHALMIC | Status: DC | PRN
  Administered 2017-01-23: 1 via OPHTHALMIC

## 2017-01-23 MED ORDER — MOXIFLOXACIN HCL 0.5 % OP SOLN: 1.0000 [drp] | OPHTHALMIC | Status: DC | PRN

## 2017-01-23 MED ORDER — POVIDONE-IODINE 5 % OP SOLN
OPHTHALMIC | Status: AC
  Filled 2017-01-23: qty 30

## 2017-01-23 MED ORDER — ONDANSETRON HCL 4 MG/2ML IJ SOLN
INTRAMUSCULAR | Status: AC
  Filled 2017-01-23: qty 2

## 2017-01-23 MED ORDER — LIDOCAINE HCL (PF) 4 % IJ SOLN
INTRAMUSCULAR | Status: AC
  Filled 2017-01-23: qty 5

## 2017-01-23 MED ORDER — GLYCOPYRROLATE 0.2 MG/ML IJ SOLN
INTRAMUSCULAR | Status: DC | PRN
  Administered 2017-01-23: 0.2 mg via INTRAVENOUS

## 2017-01-23 MED ORDER — NA CHONDROIT SULF-NA HYALURON 40-17 MG/ML IO SOLN
INTRAOCULAR | Status: AC
  Filled 2017-01-23: qty 1

## 2017-01-23 MED ORDER — CARBACHOL 0.01 % IO SOLN
INTRAOCULAR | Status: DC | PRN
  Administered 2017-01-23: .5 mL via INTRAOCULAR

## 2017-01-23 MED ORDER — ARMC OPHTHALMIC DILATING DROPS
OPHTHALMIC | Status: AC
  Filled 2017-01-23: qty 0.4

## 2017-01-23 MED ORDER — FENTANYL CITRATE (PF) 100 MCG/2ML IJ SOLN
INTRAMUSCULAR | Status: AC
  Filled 2017-01-23: qty 2

## 2017-01-23 MED ORDER — NA CHONDROIT SULF-NA HYALURON 40-17 MG/ML IO SOLN
INTRAOCULAR | Status: DC | PRN
  Administered 2017-01-23: 1 mL via INTRAOCULAR

## 2017-01-23 MED ORDER — GLYCOPYRROLATE 0.2 MG/ML IJ SOLN
INTRAMUSCULAR | Status: AC
  Filled 2017-01-23: qty 1

## 2017-01-23 MED ORDER — MOXIFLOXACIN HCL 0.5 % OP SOLN
OPHTHALMIC | Status: DC | PRN
  Administered 2017-01-23: .2 mL via OPHTHALMIC

## 2017-01-23 SURGICAL SUPPLY — 16 items
GLOVE BIO SURGEON STRL SZ8 (GLOVE) ×2 IMPLANT
GLOVE BIOGEL M 6.5 STRL (GLOVE) ×2 IMPLANT
GLOVE SURG LX 8.0 MICRO (GLOVE) ×1
GLOVE SURG LX STRL 8.0 MICRO (GLOVE) ×1 IMPLANT
GOWN STRL REUS W/ TWL LRG LVL3 (GOWN DISPOSABLE) ×2 IMPLANT
GOWN STRL REUS W/TWL LRG LVL3 (GOWN DISPOSABLE) ×2
LABEL CATARACT MEDS ST (LABEL) ×2 IMPLANT
LENS IOL TECNIS ITEC 21.0 (Intraocular Lens) ×2 IMPLANT
PACK CATARACT (MISCELLANEOUS) ×2 IMPLANT
PACK CATARACT BRASINGTON LX (MISCELLANEOUS) ×2 IMPLANT
PACK EYE AFTER SURG (MISCELLANEOUS) ×2 IMPLANT
SOL BSS BAG (MISCELLANEOUS) ×2
SOLUTION BSS BAG (MISCELLANEOUS) ×1 IMPLANT
SYR 5ML LL (SYRINGE) ×2 IMPLANT
WATER STERILE IRR 250ML POUR (IV SOLUTION) ×2 IMPLANT
WIPE NON LINTING 3.25X3.25 (MISCELLANEOUS) ×2 IMPLANT

## 2017-01-23 NOTE — Transfer of Care (Signed)
Immediate Anesthesia Transfer of Care Note  Patient: Anita Walker  Procedure(s) Performed: CATARACT EXTRACTION PHACO AND INTRAOCULAR LENS PLACEMENT ([place]) (Left Eye)  Patient Location: PACU  Anesthesia Type:MAC  Level of Consciousness: awake, alert  and oriented  Airway & Oxygen Therapy: Patient Spontanous Breathing  Post-op Assessment: Report given to RN and Post -op Vital signs reviewed and stable  Post vital signs: Reviewed and stable  Last Vitals:  Vitals:   01/23/17 0608 01/23/17 0751  BP: (!) 145/93 (!) 143/56  Pulse: 75 73  Resp: 18 18  Temp: (!) 36.4 C   SpO2: 98% 98%    Last Pain:  Vitals:   01/23/17 0608  TempSrc: Tympanic         Complications: No apparent anesthesia complications

## 2017-01-23 NOTE — Anesthesia Post-op Follow-up Note (Signed)
Anesthesia QCDR form completed.        

## 2017-01-23 NOTE — Anesthesia Postprocedure Evaluation (Signed)
Anesthesia Post Note  Patient: Anita Walker  Procedure(s) Performed: CATARACT EXTRACTION PHACO AND INTRAOCULAR LENS PLACEMENT ([place]) (Left Eye)  Patient location during evaluation: Short Stay Anesthesia Type: MAC Level of consciousness: awake and alert and oriented Pain management: pain level controlled Vital Signs Assessment: post-procedure vital signs reviewed and stable Respiratory status: spontaneous breathing and nonlabored ventilation Cardiovascular status: stable Postop Assessment: no headache, no apparent nausea or vomiting and adequate PO intake Anesthetic complications: no     Last Vitals:  Vitals:   01/23/17 0749 01/23/17 0751  BP: (!) 143/56 (!) 143/56  Pulse: 78 73  Resp:  18  Temp: (!) 36.1 C   SpO2: 99% 98%    Last Pain:  Vitals:   01/23/17 6734  TempSrc: Tympanic                 Anita Walker

## 2017-01-23 NOTE — Discharge Instructions (Signed)
Eye Surgery Discharge Instructions  Expect mild scratchy sensation or mild soreness. DO NOT RUB YOUR EYE!  The day of surgery:  Minimal physical activity, but bed rest is not required  No reading, computer work, or close hand work  No bending, lifting, or straining.  May watch TV  For 24 hours:  No driving, legal decisions, or alcoholic beverages  Safety precautions  Eat anything you prefer: It is better to start with liquids, then soup then solid foods.  _____ Eye patch should be worn until postoperative exam tomorrow.  ____ Solar shield eyeglasses should be worn for comfort in the sunlight/patch while sleeping  Resume all regular medications including aspirin or Coumadin if these were discontinued prior to surgery. You may shower, bathe, shave, or wash your hair. Tylenol may be taken for mild discomfort.  Call your doctor if you experience significant pain, nausea, or vomiting, fever > 101 or other signs of infection. [phone] or [phone] Specific instructions:  Follow-up Information    Birder Robson, MD Follow up.   Specialty:  Ophthalmology Why:  November 21 at 9:40am Contact information: [address] [place] [address] [phone]

## 2017-01-23 NOTE — Anesthesia Preprocedure Evaluation (Signed)
Anesthesia Evaluation  Patient identified by MRN, date of birth, ID band Patient awake    Reviewed: Allergy & Precautions, NPO status , Patient's Chart, lab work & pertinent test results  History of Anesthesia Complications Negative for: history of anesthetic complications  Airway Mallampati: II  TM Distance: >3 FB Neck ROM: Full    Dental no notable dental hx.    Pulmonary neg pulmonary ROS, neg sleep apnea, neg COPD,    breath sounds clear to auscultation- rhonchi (-) wheezing      Cardiovascular hypertension, Pt. on medications (-) CAD, (-) Past MI and (-) Cardiac Stents  Rhythm:Regular Rate:Normal - Systolic murmurs and - Diastolic murmurs    Neuro/Psych negative neurological ROS  negative psych ROS   GI/Hepatic Neg liver ROS, PUD, GERD  ,  Endo/Other  diabetes (diet controlled)  Renal/GU negative Renal ROS     Musculoskeletal   Abdominal (+) + obese,   Peds  Hematology  (+) anemia ,   Anesthesia Other Findings   Reproductive/Obstetrics                             Anesthesia Physical Anesthesia Plan  ASA: III  Anesthesia Plan: MAC   Post-op Pain Management:    Induction: Intravenous  PONV Risk Score and Plan: 2 and Midazolam  Airway Management Planned: Natural Airway  Additional Equipment:   Intra-op Plan:   Post-operative Plan:   Informed Consent: I have reviewed the patients History and Physical, chart, labs and discussed the procedure including the risks, benefits and alternatives for the proposed anesthesia with the patient or authorized representative who has indicated his/her understanding and acceptance.     Plan Discussed with: CRNA and Anesthesiologist  Anesthesia Plan Comments:         Anesthesia Quick Evaluation

## 2017-01-23 NOTE — Op Note (Signed)
PREOPERATIVE DIAGNOSIS:  Nuclear sclerotic cataract of the left eye.   POSTOPERATIVE DIAGNOSIS:  Nuclear sclerotic cataract of the left eye.   OPERATIVE PROCEDURE: Procedure(s): CATARACT EXTRACTION PHACO AND INTRAOCULAR LENS PLACEMENT ([facility])   SURGEON:  Birder Robson, MD.   ANESTHESIA:  Anesthesiologist: Emmie Niemann, MD CRNA: Jonna Clark, CRNA  1.      Managed anesthesia care. 2.     0.10ml of Shugarcaine was instilled following the paracentesis   COMPLICATIONS:  None.   TECHNIQUE:   Stop and chop   DESCRIPTION OF PROCEDURE:  The patient was examined and consented in the preoperative holding area where the aforementioned topical anesthesia was applied to the left eye and then brought back to the Operating Room where the left eye was prepped and draped in the usual sterile ophthalmic fashion and a lid speculum was placed. A paracentesis was created with the side port blade and the anterior chamber was filled with viscoelastic. A near clear corneal incision was performed with the steel keratome. A continuous curvilinear capsulorrhexis was performed with a cystotome followed by the capsulorrhexis forceps. Hydrodissection and hydrodelineation were carried out with BSS on a blunt cannula. The lens was removed in a stop and chop  technique and the remaining cortical material was removed with the irrigation-aspiration handpiece. The capsular bag was inflated with viscoelastic and the Technis [id number] lens was placed in the capsular bag without complication. The remaining viscoelastic was removed from the eye with the irrigation-aspiration handpiece. The wounds were hydrated. The anterior chamber was flushed with Miostat and the eye was inflated to physiologic pressure. 0.17ml Vigamox was placed in the anterior chamber. The wounds were found to be water tight. The eye was dressed with Vigamox. The patient was given protective glasses to wear throughout the day and a shield with which to sleep  tonight. The patient was also given drops with which to begin a drop regimen today and will follow-up with me in one day. Implant Name Type Inv. Item Serial No. Manufacturer Lot No. LRB No. Used  LENS IOL DIOP 21.0 - [id number] Intraocular Lens LENS IOL DIOP 21.0 [id number] AMO  Left 1    Procedure(s) with comments: CATARACT EXTRACTION PHACO AND INTRAOCULAR LENS PLACEMENT ([facility]) (Left) - [place] 00:50 AP% 16.1 CDE 8.18 Fluid pack lot #[id number]  Electronically signed: [place] 01/23/2017 7:48 AM

## 2017-01-23 NOTE — H&P (Signed)
All labs reviewed. Abnormal studies sent to patients PCP when indicated.  Previous H&P reviewed, patient examined, there are NO CHANGES.  Anita Walker LOUIS11/20/20187:14 AM

## 2017-01-29 ENCOUNTER — Ambulatory Visit (INDEPENDENT_AMBULATORY_CARE_PROVIDER_SITE_OTHER): Payer: Medicare Other | Admitting: Urology

## 2017-01-29 VITALS — BP 153/81 | HR 91 | Ht 69.0 in | Wt 236.9 lb

## 2017-01-29 DIAGNOSIS — N3946 Mixed incontinence: Secondary | ICD-10-CM | POA: Diagnosis not present

## 2017-01-29 DIAGNOSIS — R350 Frequency of micturition: Secondary | ICD-10-CM

## 2017-01-29 LAB — URINALYSIS, COMPLETE
Bilirubin, UA: NEGATIVE
Glucose, UA: NEGATIVE
Ketones, UA: NEGATIVE
NITRITE UA: NEGATIVE
PH UA: 6.5 (ref 5.0–7.5)
Protein, UA: NEGATIVE
RBC, UA: NEGATIVE
Specific Gravity, UA: 1.015 (ref 1.005–1.030)
UUROB: 0.2 mg/dL (ref 0.2–1.0)

## 2017-01-29 LAB — MICROSCOPIC EXAMINATION

## 2017-01-29 LAB — BLADDER SCAN AMB NON-IMAGING: Scan Result: 99

## 2017-01-29 MED ORDER — MIRABEGRON ER 50 MG PO TB24: 50.0000 mg | ORAL_TABLET | Freq: Every day | ORAL | 11 refills | Status: DC

## 2017-01-29 NOTE — Progress Notes (Signed)
01/29/2017 2:20 PM   Anita Walker 07-01-1940 [id number]  Referring provider: Einar Pheasant, [address] [place], [place] [address]  Chief Complaint  Patient presents with  . Urinary Frequency    HPI: I was consulted to assess the patient's urgency incontinence which primarily occurs with foot on the floor syndrome in the middle of the night.  Less frequently during the day she can have urgency incontinence and denies stress incontinence.  At night she wears 1 pad that is moderately wet  She voids every 2 hours during the day but sometimes every 15-30 minutes after fluids.  She gets up 3 times at night  She is a diet-controlled diabetic and prone to constipation she has no neurologic issue presentation is not been medically treated And she might occasionally get a bladder infection  She denies a history of kidney stones or previous GU surgery.  Modifying factors: There are no other modifying factors  Associated signs and symptoms: There are no other associated signs and symptoms Aggravating and relieving factors: There are no other aggravating or relieving factors Severity: Moderate Duration: Persistent   PMH: Past Medical History:  Diagnosis Date  . Anemia   . Arthritis   . Atrial fibrillation ([place])   . Breast cancer ([place])    s/p lumpectomy 1992.  s/p chemo and xrt left breast  . Diabetes mellitus ([place])   . Edema    feet/legs  . Gastric ulcer   . GERD (gastroesophageal reflux disease)   . HOH (hard of hearing)    aides  . Hypercholesterolemia   . Hypertension   . Pulmonary emboli [facility])     Surgical History: Past Surgical History:  Procedure Laterality Date  . BREAST LUMPECTOMY  03/06/1990   left breast  . CATARACT EXTRACTION W/PHACO Right 12/26/2016   Procedure: CATARACT EXTRACTION PHACO AND INTRAOCULAR LENS PLACEMENT (IOC)-RIGHT DIABETIC;  Surgeon: Birder Robson, MD;  Location: [facility] ORS;  Service: Ophthalmology;   Laterality: Right;  [place] 00:47 AP% 24.5 CDE 11.62 Fluid pack lot # [id number]  . CATARACT EXTRACTION W/PHACO Left 01/23/2017   Procedure: CATARACT EXTRACTION PHACO AND INTRAOCULAR LENS PLACEMENT (IOC);  Surgeon: Birder Robson, MD;  Location: [facility] ORS;  Service: Ophthalmology;  Laterality: Left;  [place] 00:50 AP% 16.1 CDE 8.18 Fluid pack lot #[id number]    Home Medications:  Allergies as of 01/29/2017      Reactions   Penicillins Other (See Comments)   Unknown- pt states been a long time ago  Has patient had a PCN reaction causing immediate rash, facial/tongue/throat swelling, SOB or lightheadedness with hypotension: Unknown Has patient had a PCN reaction causing severe rash involving mucus membranes or skin necrosis: Unknown Has patient had a PCN reaction that required hospitalization: Unknown Has patient had a PCN reaction occurring within the last 10 years: Unknown If all of the above answers are "NO", then may proceed with Cephalosporin use.   Penicillin V Potassium Nausea And Vomiting      Medication List        Accurate as of 01/29/17  2:20 PM. Always use your most recent med list.          acetaminophen 500 MG tablet Commonly known as:  TYLENOL Take 500 mg by mouth 2 (two) times daily as needed for mild pain.   bisacodyl 5 MG EC tablet Commonly known as:  DULCOLAX Take 5 mg daily as needed by mouth for mild constipation or moderate constipation.   diphenhydrAMINE 25 mg capsule  Commonly known as:  BENADRYL Take 25 mg at bedtime as needed by mouth for sleep.   DUREZOL 0.05 % Emul Generic drug:  Difluprednate Place 1 drop 2 (two) times daily into the right eye.   ferrous sulfate 325 (65 FE) MG tablet TAKE ONE (1) TABLET BY MOUTH EVERY DAY   glucose blood test strip Commonly known as:  BAYER CONTOUR TEST Check one time day  Dx E11.9   ILEVRO 0.3 % ophthalmic suspension Generic drug:  nepafenac Place 1 drop daily into the right eye.   losartan 100 MG  tablet Commonly known as:  COZAAR Take 1 tablet (100 mg total) daily by mouth.   multivitamin with minerals Tabs tablet Take 1 tablet daily by mouth.   pantoprazole 40 MG tablet Commonly known as:  PROTONIX Take 1 tablet (40 mg total) by mouth daily.   simvastatin 10 MG tablet Commonly known as:  ZOCOR TAKE ONE (1) TABLET BY MOUTH EVERY DAY   warfarin 2.5 MG tablet Commonly known as:  COUMADIN TAKE 2 TABLETS BY MOUTH ON TUESDAY, THURSDAY, SATURDAY, AND SUNDAY. TAKE 1 TABLET ON ALL OTHER DAYS       Allergies:  Allergies  Allergen Reactions  . Penicillins Other (See Comments)    Unknown- pt states been a long time ago  Has patient had a PCN reaction causing immediate rash, facial/tongue/throat swelling, SOB or lightheadedness with hypotension: Unknown Has patient had a PCN reaction causing severe rash involving mucus membranes or skin necrosis: Unknown Has patient had a PCN reaction that required hospitalization: Unknown Has patient had a PCN reaction occurring within the last 10 years: Unknown If all of the above answers are "NO", then may proceed with Cephalosporin use.  Marland Kitchen Penicillin V Potassium Nausea And Vomiting    Family History: Family History  Problem Relation Age of Onset  . Heart disease Father   . Heart disease Brother        s/p CABG  . Colon cancer Neg Hx   . Breast cancer Neg Hx     Social History:  reports that  has never smoked. she has never used smokeless tobacco. She reports that she does not drink alcohol or use drugs.  ROS: UROLOGY Frequent Urination?: Yes Hard to postpone urination?: Yes Burning/pain with urination?: No Get up at night to urinate?: Yes Leakage of urine?: Yes Urine stream starts and stops?: No Trouble starting stream?: No Do you have to strain to urinate?: No Blood in urine?: No Urinary tract infection?: No Sexually transmitted disease?: No Injury to kidneys or bladder?: No Painful intercourse?: No Weak stream?:  No Currently pregnant?: No Vaginal bleeding?: No Last menstrual period?: n  Gastrointestinal Nausea?: Yes Vomiting?: No Indigestion/heartburn?: No Diarrhea?: No Constipation?: Yes  Constitutional Fever: No Night sweats?: No Weight loss?: No Fatigue?: No  Skin Skin rash/lesions?: No Itching?: No  Eyes Blurred vision?: No Double vision?: No  Ears/Nose/Throat Sore throat?: No Sinus problems?: No  Hematologic/Lymphatic Swollen glands?: No Easy bruising?: Yes  Cardiovascular Leg swelling?: No Chest pain?: No  Respiratory Cough?: Yes Shortness of breath?: No  Endocrine Excessive thirst?: No  Musculoskeletal Back pain?: No Joint pain?: No  Neurological Headaches?: No Dizziness?: No  Psychologic Depression?: No Anxiety?: No  Physical Exam: BP (!) 153/81 (BP Location: Right Arm, Patient Position: Sitting, Cuff Size: Large)   Pulse 91   Ht 5\' 9"  (1.753 m)   Wt 236 lb 14.4 oz (107.5 kg)   BMI 34.98 kg/m   Constitutional:  Alert  and oriented, No acute distress. HEENT: [place] AT, moist mucus membranes.  Trachea midline, no masses. Cardiovascular: No clubbing, cyanosis, or edema. Respiratory: Normal respiratory effort, no increased work of breathing. GI: Abdomen is soft, nontender, nondistended, no abdominal masses GUThe pelvic examination was limited by her immobility.  She had a grade 2 cystocele that almost reached the introitus.  She had no stress incontinence Skin: No rashes, bruises or suspicious lesions. Lymph: No cervical or inguinal adenopathy. Neurologic: Grossly intact, no focal deficits, moving all 4 extremities. Psychiatric: Normal mood and affect.  Laboratory Data: Lab Results  Component Value Date   WBC 7.9 11/07/2016   HGB 15.0 11/07/2016   HCT 45.0 11/07/2016   MCV 91.4 11/07/2016   PLT 206.0 11/07/2016    Lab Results  Component Value Date   CREATININE 0.91 11/07/2016    No results found for: PSA  No results found for:  TESTOSTERONE  Lab Results  Component Value Date   HGBA1C 5.8 09/08/2016    Urinalysis    Component Value Date/Time   COLORURINE YELLOW (A) 11/02/2016 2216   APPEARANCEUR CLEAR (A) 11/02/2016 2216   APPEARANCEUR Clear 07/16/2013 1405   LABSPEC 1.029 11/02/2016 2216   LABSPEC 1.008 07/16/2013 1405   PHURINE 6.0 11/02/2016 2216   GLUCOSEU NEGATIVE 11/02/2016 2216   GLUCOSEU Negative 07/16/2013 1405   HGBUR NEGATIVE 11/02/2016 2216   BILIRUBINUR NEGATIVE 11/02/2016 2216   BILIRUBINUR neg 07/18/2013 0948   BILIRUBINUR Negative 07/16/2013 1405   KETONESUR 5 (A) 11/02/2016 2216   PROTEINUR NEGATIVE 11/02/2016 2216   UROBILINOGEN 0.2 07/18/2013 0948   NITRITE NEGATIVE 11/02/2016 2216   LEUKOCYTESUR NEGATIVE 11/02/2016 2216   LEUKOCYTESUR 3+ 07/16/2013 1405    Pertinent Imaging: None  Assessment & Plan: The patient primarily has urgency incontinence with mild frequency and nocturia.  She likely has a component of a nocturnal diuresis.  If overactive bladder medications did not reach her treatment goal desmopressin would also be an option.  I may need to cystoscope her in the future but she had no blood in the urine today and I sent it for culture  I will reassess the patient in 4-6 weeks on the Myrbetriq 50 mg samples and prescription.  Percutaneous tibial nerve stimulation will be another future option  1. Urinary frequency 2.  Urgency incontinence - Bladder Scan (Post Void Residual) in office - Urinalysis, Complete   No Follow-up on file.  Reece Packer, MD  [facility] [address] [place], [place] [address] [phone]

## 2017-01-31 ENCOUNTER — Telehealth: Payer: Self-pay | Admitting: *Deleted

## 2017-01-31 ENCOUNTER — Other Ambulatory Visit (INDEPENDENT_AMBULATORY_CARE_PROVIDER_SITE_OTHER): Payer: Medicare Other

## 2017-01-31 DIAGNOSIS — I10 Essential (primary) hypertension: Secondary | ICD-10-CM | POA: Diagnosis not present

## 2017-01-31 DIAGNOSIS — Z7901 Long term (current) use of anticoagulants: Secondary | ICD-10-CM

## 2017-01-31 LAB — PROTIME-INR
INR: 1.9 ratio — ABNORMAL HIGH (ref 0.8–1.0)
Prothrombin Time: 20 s — ABNORMAL HIGH (ref 9.6–13.1)

## 2017-01-31 LAB — URINE CULTURE: ORGANISM ID, BACTERIA: NO GROWTH

## 2017-01-31 NOTE — Addendum Note (Signed)
Addended by: Leeanne Rio on: 01/31/2017 04:31 PM   Modules accepted: Orders

## 2017-01-31 NOTE — Telephone Encounter (Signed)
Yes.  Please add met b.  I have placed the order.  Thank you for sending me this.

## 2017-01-31 NOTE — Telephone Encounter (Signed)
Pt came in for labs today & asked to have her BP checked. Pt states that her BP medication was changed a few weeks ago & she wanted to make sure she was checking it right.  BP (with large cuff): 140/79.  Note: I also drew a SST in addition to her PT/INR in case a BMET or CMET is needed. I will hold it until I hear back from you.

## 2017-01-31 NOTE — Telephone Encounter (Signed)
Bmet added

## 2017-02-01 LAB — BASIC METABOLIC PANEL
BUN: 16 mg/dL (ref 6–23)
CALCIUM: 10 mg/dL (ref 8.4–10.5)
CO2: 28 mEq/L (ref 19–32)
CREATININE: 0.94 mg/dL (ref 0.40–1.20)
Chloride: 103 mEq/L (ref 96–112)
GFR: 61.07 mL/min (ref >60.00)
GLUCOSE: 104 mg/dL — AB (ref 70–99)
Potassium: 4.2 mEq/L (ref 3.5–5.1)
Sodium: 138 mEq/L (ref 135–145)

## 2017-02-12 ENCOUNTER — Other Ambulatory Visit: Payer: Medicare Other

## 2017-02-12 ENCOUNTER — Ambulatory Visit: Payer: BLUE CROSS/BLUE SHIELD

## 2017-02-14 ENCOUNTER — Other Ambulatory Visit (INDEPENDENT_AMBULATORY_CARE_PROVIDER_SITE_OTHER): Payer: Medicare Other

## 2017-02-14 DIAGNOSIS — Z7901 Long term (current) use of anticoagulants: Secondary | ICD-10-CM

## 2017-02-14 LAB — PROTIME-INR
INR: 1.7 ratio — ABNORMAL HIGH (ref 0.8–1.0)
PROTHROMBIN TIME: 18.8 s — AB (ref 9.6–13.1)

## 2017-03-01 ENCOUNTER — Other Ambulatory Visit (INDEPENDENT_AMBULATORY_CARE_PROVIDER_SITE_OTHER): Payer: Medicare Other

## 2017-03-01 DIAGNOSIS — Z7901 Long term (current) use of anticoagulants: Secondary | ICD-10-CM

## 2017-03-01 LAB — PROTIME-INR
INR: 2.2 ratio — AB (ref 0.8–1.0)
PROTHROMBIN TIME: 23.2 s — AB (ref 9.6–13.1)

## 2017-03-02 ENCOUNTER — Ambulatory Visit (INDEPENDENT_AMBULATORY_CARE_PROVIDER_SITE_OTHER): Payer: Medicare Other

## 2017-03-02 VITALS — BP 128/70 | HR 75 | Temp 97.7°F | Resp 14 | Ht 68.0 in | Wt 239.1 lb

## 2017-03-02 DIAGNOSIS — Z0000 Encounter for general adult medical examination without abnormal findings: Secondary | ICD-10-CM | POA: Diagnosis not present

## 2017-03-02 DIAGNOSIS — Z1331 Encounter for screening for depression: Secondary | ICD-10-CM

## 2017-03-02 NOTE — Progress Notes (Signed)
Subjective:   Anita Walker is a 76 y.o. female who presents for Medicare Annual (Subsequent) preventive examination.  Review of Systems:  No ROS.  Medicare Wellness Visit. Additional risk factors are reflected in the social history.  Cardiac Risk Factors include: advanced age (>36men, >2 women);diabetes mellitus;hypertension     Objective:     Vitals: BP 128/70 (BP Location: Right Arm, Patient Position: Sitting, Cuff Size: Large)   Pulse 75   Temp 97.7 F (36.5 C) (Oral)   Resp 14   Ht 5\' 8"  (1.727 m)   Wt 239 lb 1.9 oz (108.5 kg)   SpO2 96%   BMI 36.36 kg/m   Body mass index is 36.36 kg/m.  Advanced Directives 03/02/2017 01/23/2017 01/23/2017 12/26/2016 11/02/2016 10/30/2016 10/30/2016  Does Patient Have a Medical Advance Directive? Yes - Yes Yes Yes No No  Type of Advance Directive [place];Living will - [place];Living will Living will [place];Living will - -  Does patient want to make changes to medical advance directive? No - Patient declined - - No - Patient declined - - -  Copy of [place] in Chart? No - copy requested Yes - - No - copy requested - -  Would patient like information on creating a medical advance directive? - - - - No - Patient declined No - Patient declined -    Tobacco Social History   Tobacco Use  Smoking Status Never Smoker  Smokeless Tobacco Never Used     Counseling given: Not Answered   Clinical Intake:  Pre-visit preparation completed: Yes  Pain : No/denies pain     Nutritional Status: BMI > 30  Obese Diabetes: Yes(Followed by PCP)  How often do you need to have someone help you when you read instructions, pamphlets, or other written materials from your doctor or pharmacy?: 1 - Never  Interpreter Needed?: No     Past Medical History:  Diagnosis Date  . Anemia   . Arthritis   . Atrial fibrillation ([place])   . Breast cancer ([place])    s/p lumpectomy 1992.  s/p chemo and xrt left breast  . Diabetes mellitus ([place])   . Edema    feet/legs  . Gastric ulcer   . GERD (gastroesophageal reflux disease)   . HOH (hard of hearing)    aides  . Hypercholesterolemia   . Hypertension   . Pulmonary emboli [facility])    Past Surgical History:  Procedure Laterality Date  . BREAST LUMPECTOMY  03/06/1990   left breast  . CATARACT EXTRACTION W/PHACO Right 12/26/2016   Procedure: CATARACT EXTRACTION PHACO AND INTRAOCULAR LENS PLACEMENT (IOC)-RIGHT DIABETIC;  Surgeon: Birder Robson, MD;  Location: [facility] ORS;  Service: Ophthalmology;  Laterality: Right;  [place] 00:47 AP% 24.5 CDE 11.62 Fluid pack lot # [id number]  . CATARACT EXTRACTION W/PHACO Left 01/23/2017   Procedure: CATARACT EXTRACTION PHACO AND INTRAOCULAR LENS PLACEMENT (IOC);  Surgeon: Birder Robson, MD;  Location: [facility] ORS;  Service: Ophthalmology;  Laterality: Left;  [place] 00:50 AP% 16.1 CDE 8.18 Fluid pack lot #[id number] H   Family History  Problem Relation Age of Onset  . Heart disease Father   . Heart disease Brother        s/p CABG  . Colon cancer Neg Hx   . Breast cancer Neg Hx    Social History   Socioeconomic History  . Marital status: Married    Spouse name: None  . Number of children:  None  . Years of education: None  . Highest education level: None  Social Needs  . Financial resource strain: None  . Food insecurity - worry: None  . Food insecurity - inability: None  . Transportation needs - medical: None  . Transportation needs - non-medical: None  Occupational History  . None  Tobacco Use  . Smoking status: Never Smoker  . Smokeless tobacco: Never Used  Substance and Sexual Activity  . Alcohol use: No    Alcohol/week: 0.0 oz  . Drug use: No  . Sexual activity: None  Other Topics Concern  . None  Social History Narrative  . None    Outpatient Encounter Medications as of 03/02/2017  Medication Sig  . acetaminophen (TYLENOL) 500 MG tablet Take 500  mg by mouth 2 (two) times daily as needed for mild pain.  . bisacodyl (DULCOLAX) 5 MG EC tablet Take 5 mg daily as needed by mouth for mild constipation or moderate constipation.  . Difluprednate (DUREZOL) 0.05 % EMUL Place 1 drop 2 (two) times daily into the right eye.  . diphenhydrAMINE (BENADRYL) 25 mg capsule Take 25 mg at bedtime as needed by mouth for sleep.   . ferrous sulfate 325 (65 FE) MG tablet TAKE ONE (1) TABLET BY MOUTH EVERY DAY (Patient taking differently: Take 325 mg daily with supper by mouth. )  . glucose blood (BAYER CONTOUR TEST) test strip Check one time day  Dx E11.9  . losartan (COZAAR) 100 MG tablet Take 1 tablet (100 mg total) daily by mouth.  . mirabegron ER (MYRBETRIQ) 50 MG TB24 tablet Take 1 tablet (50 mg total) by mouth daily.  . Multiple Vitamin (MULTIVITAMIN WITH MINERALS) TABS tablet Take 1 tablet daily by mouth.  . nepafenac (ILEVRO) 0.3 % ophthalmic suspension Place 1 drop daily into the right eye.  . pantoprazole (PROTONIX) 40 MG tablet Take 1 tablet (40 mg total) by mouth daily. (Patient taking differently: Take 40 mg by mouth every other day. )  . simvastatin (ZOCOR) 10 MG tablet TAKE ONE (1) TABLET BY MOUTH EVERY DAY (Patient taking differently: Take 10 mg daily at 6 PM by mouth. )  . warfarin (COUMADIN) 2.5 MG tablet TAKE 2 TABLETS BY MOUTH ON TUESDAY, THURSDAY, SATURDAY, AND SUNDAY. TAKE 1 TABLET ON ALL OTHER DAYS (Patient taking differently: Take 2.5-5 mg See admin instructions by mouth. Takes 2 tablets (5 mg) on Tuesdays and Thursdays and 1 tablet (2.5 mg)  all other days of the week)   No facility-administered encounter medications on file as of 03/02/2017.     Activities of Daily Living In your present state of health, do you have any difficulty performing the following activities: 03/02/2017 10/30/2016  Hearing? Tempie Donning  Vision? N N  Difficulty concentrating or making decisions? N N  Walking or climbing stairs? N Y  Dressing or bathing? N N  Doing  errands, shopping? N N  Preparing Food and eating ? N -  Using the Toilet? N -  In the past six months, have you accidently leaked urine? Y -  Comment Followed by Urologist and PCP -  Do you have problems with loss of bowel control? N -  Managing your Medications? N -  Managing your Finances? N -  Housekeeping or managing your Housekeeping? N -  Some recent data might be hidden    Patient Care Team: Einar Pheasant, MD as PCP - General (Internal Medicine)    Assessment:   This is a routine wellness examination  for Anita Walker. The goal of the wellness visit is to assist the patient how to close the gaps in care and create a preventative care plan for the patient.   The roster of all physicians providing medical care to patient is listed in the Snapshot section of the chart.  Osteoporosis risk reviewed.    Safety issues reviewed; Smoke and carbon monoxide detectors in the home. No firearms in the home.  Wears seatbelts when driving or riding with others. Patient does wear sunscreen or protective clothing when in direct sunlight. No violence in the home.  Depression- PHQ 2 &9 complete.  No signs/symptoms or verbal communication regarding little pleasure in doing things, feeling down, depressed or hopeless. No changes in sleeping, energy, eating, concentrating.  No thoughts of self harm or harm towards others.  Time spent on this topic is 8 minutes.   Patient is alert, normal appearance, oriented to person/place/and time.  Correctly identified the president of the [place], recall of 2/3 words, and performing simple calculations. Displays appropriate judgement and can read correct time from watch face.   No new identified risk were noted.  No failures at ADL's or IADL's.  Ambulates with cane.  BMI- discussed the importance of a healthy diet, water intake and the benefits of aerobic exercise. Educational material provided.   24 hour diet recall: Breakfast: cereal Lunch: yogurt, applesauce,  deli sandwich Dinner: sub sandwich with flat braed Snack: nabs  Daily fluid intake: 1 cups of caffeine, 6 cups of water  Dental- dentures  Eye- Visual acuity not assessed per patient preference since they have regular follow up with the ophthalmologist.  Wears corrective lenses.  Sleep patterns- Sleeps 5-7 hours at night.  Wakes feeling rested.  Naps during the day.   TDAP vaccine deferred per patient preference.  Follow up with insurance.  Educational material provided.  Health maintenance gaps- closed.  Patient Concerns: None at this time. Follow up with PCP as needed.  Exercise Activities and Dietary recommendations Current Exercise Habits: Home exercise routine, Type of exercise: walking;calisthenics, Time (Minutes): 10, Frequency (Times/Week): 7, Weekly Exercise (Minutes/Week): 70, Intensity: Mild  Goals    . Maintain Healthy Lifestyle     Stay hydrated Low carb diet Use bike for exercise       Fall Risk Fall Risk  03/02/2017 02/11/2016 12/24/2015 09/16/2014 08/09/2012  Falls in the past year? No No No No No   Depression Screen PHQ 2/9 Scores 03/02/2017 09/08/2016 02/11/2016 12/24/2015  PHQ - 2 Score 0 0 0 0  PHQ- 9 Score 0 0 - -     Cognitive Function MMSE - Mini Mental State Exam 02/11/2016  Not completed: Refused     6CIT Screen 03/02/2017 02/11/2016  What Year? 0 points 0 points  What month? 0 points 0 points  What time? 0 points 0 points  Count back from 20 0 points (No Data)  Months in reverse 0 points (No Data)  Repeat phrase 0 points 2 points  Total Score 0 -    Immunization History  Administered Date(s) Administered  . Influenza Split 11/19/2011, 12/09/2012  . Influenza, High Dose Seasonal PF 12/02/2015, 11/07/2016  . Influenza,inj,Quad PF,6+ Mos 12/03/2013, 11/12/2014  . Influenza-Unspecified 12/12/2011, 12/11/2012, 12/03/2013, 11/12/2014, 12/02/2015  . Pneumococcal Conjugate-13 01/14/2014  . Pneumococcal Polysaccharide-23 02/11/2016   Screening  Tests Health Maintenance  Topic Date Due  . TETANUS/TDAP  03/21/1959  . OPHTHALMOLOGY EXAM  01/06/2017  . HEMOGLOBIN A1C  03/11/2017  . FOOT EXAM  09/18/2017  .  MAMMOGRAM  11/29/2017  . INFLUENZA VACCINE  Completed  . DEXA SCAN  Completed  . PNA vac Low Risk Adult  Completed       Plan:    End of life planning; Advance aging; Advanced directives discussed. Copy of current HCPOA/Living Will requested.    I have personally reviewed and noted the following in the patient's chart:   . Medical and social history . Use of alcohol, tobacco or illicit drugs  . Current medications and supplements . Functional ability and status . Nutritional status . Physical activity . Advanced directives . List of other physicians . Hospitalizations, surgeries, and ER visits in previous 12 months . Vitals . Screenings to include cognitive, depression, and falls . Referrals and appointments  In addition, I have reviewed and discussed with patient certain preventive protocols, quality metrics, and best practice recommendations. A written personalized care plan for preventive services as well as general preventive health recommendations were provided to patient.     OBrien-Blaney, Alexandra Lipps L, LPN  [id number]     I have reviewed the above information and agree with above.   Deborra Medina, MD

## 2017-03-02 NOTE — Patient Instructions (Addendum)
  [name] , Thank you for taking time to come for your Medicare Wellness Visit. I appreciate your ongoing commitment to your health goals. Please review the following plan we discussed and let me know if I can assist you in the future.   These are the goals we discussed: Goals    . Maintain Healthy Lifestyle     Stay hydrated Low carb diet Use bike for exercise       This is a list of the screening recommended for you and due dates:  Health Maintenance  Topic Date Due  . Tetanus Vaccine  03[birth year]21/1959  . Eye exam for diabetics  01/06/2017  . Hemoglobin A1C  03/11/2017  . Complete foot exam   09/18/2017  . Mammogram  11/29/2017  . Flu Shot  Completed  . DEXA scan (bone density measurement)  Completed  . Pneumonia vaccines  Completed

## 2017-03-12 ENCOUNTER — Ambulatory Visit (INDEPENDENT_AMBULATORY_CARE_PROVIDER_SITE_OTHER): Payer: Medicare Other | Admitting: Urology

## 2017-03-12 ENCOUNTER — Encounter: Payer: Self-pay | Admitting: Urology

## 2017-03-12 VITALS — BP 165/75 | HR 81 | Ht 68.0 in | Wt 239.0 lb

## 2017-03-12 DIAGNOSIS — N3946 Mixed incontinence: Secondary | ICD-10-CM

## 2017-03-12 MED ORDER — OXYBUTYNIN CHLORIDE ER 10 MG PO TB24: 10.0000 mg | ORAL_TABLET | Freq: Every day | ORAL | 11 refills | Status: DC

## 2017-03-12 MED ORDER — FESOTERODINE FUMARATE ER 8 MG PO TB24: 8.0000 mg | ORAL_TABLET | Freq: Every day | ORAL | 11 refills | Status: DC

## 2017-03-12 NOTE — Progress Notes (Signed)
03/12/2017 2:55 PM   Anita Walker 1941/01/10 [id number]  Referring provider: Einar Pheasant, [address] [place], [place] [address]  Chief Complaint  Patient presents with  . Urinary Frequency    HPI: I was consulted to assess the patient's urgency incontinence which primarily occurs with foot on the floor syndrome in the middle of the night.  Less frequently during the day she can have urgency incontinence and denies stress incontinence.  At night she wears 1 pad that is moderately wet  She voids every 2 hours during the day but sometimes every 15-30 minutes after fluids.  She gets up 3 times at night  She is a diet-controlled diabetic and prone to constipation   pelvic examination was limited by her immobility.  She had a grade 2 cystocele that almost reached the introitus.  She had no stress incontinence Skin: No rashes, bruises or suspicious lesions.  The patient primarily has urgency incontinence with mild frequency and nocturia.  She likely has a component of a nocturnal diuresis.  If overactive bladder medications did not reach her treatment goal desmopressin would also be an option.  I may need to cystoscope her in the future but she had no blood in the urine today and I sent it for culture  I will reassess the patient in 4-6 weeks on the Myrbetriq 50 mg samples and prescription.  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  Clinically not infected.  The beta 3 agonist did not help  PMH: Past Medical History:  Diagnosis Date  . Anemia   . Arthritis   . Atrial fibrillation ([place])   . Breast cancer ([place])    s/p lumpectomy 1992.  s/p chemo and xrt left breast  . Diabetes mellitus ([place])   . Edema    feet/legs  . Gastric ulcer   . GERD (gastroesophageal reflux disease)   . HOH (hard of hearing)    aides  . Hypercholesterolemia     . Hypertension   . Pulmonary emboli [facility])     Surgical History: Past Surgical History:  Procedure Laterality Date  . BREAST LUMPECTOMY  03/06/1990   left breast  . CATARACT EXTRACTION W/PHACO Right 12/26/2016   Procedure: CATARACT EXTRACTION PHACO AND INTRAOCULAR LENS PLACEMENT (IOC)-RIGHT DIABETIC;  Surgeon: Birder Robson, MD;  Location: [facility] ORS;  Service: Ophthalmology;  Laterality: Right;  [place] 00:47 AP% 24.5 CDE 11.62 Fluid pack lot # [id number]  . CATARACT EXTRACTION W/PHACO Left 01/23/2017   Procedure: CATARACT EXTRACTION PHACO AND INTRAOCULAR LENS PLACEMENT (IOC);  Surgeon: Birder Robson, MD;  Location: [facility] ORS;  Service: Ophthalmology;  Laterality: Left;  [place] 00:50 AP% 16.1 CDE 8.18 Fluid pack lot #[id number]    Home Medications:  Allergies as of 03/12/2017      Reactions   Penicillins Other (See Comments)   Unknown- pt states been a long time ago  Has patient had a PCN reaction causing immediate rash, facial/tongue/throat swelling, SOB or lightheadedness with hypotension: Unknown Has patient had a PCN reaction causing severe rash involving mucus membranes or skin necrosis: Unknown Has patient had a PCN reaction that required hospitalization: Unknown Has patient had a PCN reaction occurring within the last 10 years: Unknown If all of the above answers are "NO", then may proceed with Cephalosporin use.   Penicillin V Potassium Nausea And Vomiting      Medication List  Accurate as of 03/12/17  2:55 PM. Always use your most recent med list.          acetaminophen 500 MG tablet Commonly known as:  TYLENOL Take 500 mg by mouth 2 (two) times daily as needed for mild pain.   bisacodyl 5 MG EC tablet Commonly known as:  DULCOLAX Take 5 mg daily as needed by mouth for mild constipation or moderate constipation.   diphenhydrAMINE 25 mg capsule Commonly known as:  BENADRYL Take 25 mg at bedtime as needed by mouth for sleep.   DUREZOL 0.05 % Emul Generic drug:   Difluprednate Place 1 drop 2 (two) times daily into the right eye.   ferrous sulfate 325 (65 FE) MG tablet TAKE ONE (1) TABLET BY MOUTH EVERY DAY   glucose blood test strip Commonly known as:  BAYER CONTOUR TEST Check one time day  Dx E11.9   ILEVRO 0.3 % ophthalmic suspension Generic drug:  nepafenac Place 1 drop daily into the right eye.   losartan 100 MG tablet Commonly known as:  COZAAR Take 1 tablet (100 mg total) daily by mouth.   mirabegron ER 50 MG Tb24 tablet Commonly known as:  MYRBETRIQ Take 1 tablet (50 mg total) by mouth daily.   multivitamin with minerals Tabs tablet Take 1 tablet daily by mouth.   pantoprazole 40 MG tablet Commonly known as:  PROTONIX Take 1 tablet (40 mg total) by mouth daily.   simvastatin 10 MG tablet Commonly known as:  ZOCOR TAKE ONE (1) TABLET BY MOUTH EVERY DAY   warfarin 2.5 MG tablet Commonly known as:  COUMADIN TAKE 2 TABLETS BY MOUTH ON TUESDAY, THURSDAY, SATURDAY, AND SUNDAY. TAKE 1 TABLET ON ALL OTHER DAYS       Allergies:  Allergies  Allergen Reactions  . Penicillins Other (See Comments)    Unknown- pt states been a long time ago  Has patient had a PCN reaction causing immediate rash, facial/tongue/throat swelling, SOB or lightheadedness with hypotension: Unknown Has patient had a PCN reaction causing severe rash involving mucus membranes or skin necrosis: Unknown Has patient had a PCN reaction that required hospitalization: Unknown Has patient had a PCN reaction occurring within the last 10 years: Unknown If all of the above answers are "NO", then may proceed with Cephalosporin use.  Marland Kitchen Penicillin V Potassium Nausea And Vomiting    Family History: Family History  Problem Relation Age of Onset  . Heart disease Father   . Heart disease Brother        s/p CABG  . Colon cancer Neg Hx   . Breast cancer Neg Hx     Social History:  reports that  has never smoked. she has never used smokeless tobacco. She reports  that she does not drink alcohol or use drugs.  ROS: UROLOGY Frequent Urination?: Yes Hard to postpone urination?: Yes Burning/pain with urination?: No Get up at night to urinate?: Yes Leakage of urine?: No Urine stream starts and stops?: No Trouble starting stream?: No Do you have to strain to urinate?: No Blood in urine?: No Urinary tract infection?: No Sexually transmitted disease?: No Injury to kidneys or bladder?: No Painful intercourse?: No Weak stream?: No Currently pregnant?: No Vaginal bleeding?: No Last menstrual period?: n  Gastrointestinal Nausea?: No Vomiting?: No Indigestion/heartburn?: No Diarrhea?: No Constipation?: No  Constitutional Fever: No Night sweats?: No Weight loss?: No Fatigue?: No  Skin Skin rash/lesions?: No Itching?: No  Eyes Blurred vision?: No Double vision?: No  Ears/Nose/Throat Sore  throat?: No Sinus problems?: No  Hematologic/Lymphatic Swollen glands?: No Easy bruising?: No  Cardiovascular Leg swelling?: No Chest pain?: No  Respiratory Cough?: No Shortness of breath?: No  Endocrine Excessive thirst?: No  Musculoskeletal Back pain?: No Joint pain?: No  Neurological Headaches?: No Dizziness?: No  Psychologic Depression?: No Anxiety?: No  Physical Exam: BP (!) 165/75   Pulse 81   Ht 5\' 8"  (1.727 m)   Wt 239 lb (108.4 kg)   BMI 36.34 kg/m    Laboratory Data: Lab Results  Component Value Date   WBC 7.9 11/07/2016   HGB 15.0 11/07/2016   HCT 45.0 11/07/2016   MCV 91.4 11/07/2016   PLT 206.0 11/07/2016    Lab Results  Component Value Date   CREATININE 0.94 01/31/2017    No results found for: PSA  No results found for: TESTOSTERONE  Lab Results  Component Value Date   HGBA1C 5.8 09/08/2016    Urinalysis    Component Value Date/Time   COLORURINE YELLOW (A) 11/02/2016 2216   APPEARANCEUR Clear 01/29/2017 1403   LABSPEC 1.029 11/02/2016 2216   LABSPEC 1.008 07/16/2013 1405   PHURINE  6.0 11/02/2016 2216   GLUCOSEU Negative 01/29/2017 1403   GLUCOSEU Negative 07/16/2013 1405   HGBUR NEGATIVE 11/02/2016 2216   BILIRUBINUR Negative 01/29/2017 1403   BILIRUBINUR Negative 07/16/2013 1405   KETONESUR 5 (A) 11/02/2016 2216   PROTEINUR Negative 01/29/2017 1403   PROTEINUR NEGATIVE 11/02/2016 2216   UROBILINOGEN 0.2 07/18/2013 0948   NITRITE Negative 01/29/2017 1403   NITRITE NEGATIVE 11/02/2016 2216   LEUKOCYTESUR 1+ (A) 01/29/2017 1403   LEUKOCYTESUR 3+ 07/16/2013 1405    Pertinent Imaging:   Assessment & Plan: I gave her samples and prescription of Toviaz 8 mg.  I gave oxybutynin ER 10 mg once a day with 30 tablets and 11 refills.  Reassess in 2 months.  Likely offer percutaneous tibial nerve stimulation and/or desmopressin up on requestioning about most dominant symptom  There are no diagnoses linked to this encounter.  No Follow-up on file.  Reece Packer, MD  [facility] [address], [place], [place] [address] [phone]

## 2017-03-15 DIAGNOSIS — D485 Neoplasm of uncertain behavior of skin: Secondary | ICD-10-CM | POA: Diagnosis not present

## 2017-03-15 DIAGNOSIS — C44329 Squamous cell carcinoma of skin of other parts of face: Secondary | ICD-10-CM | POA: Diagnosis not present

## 2017-03-16 ENCOUNTER — Encounter: Payer: Self-pay | Admitting: Internal Medicine

## 2017-03-16 ENCOUNTER — Ambulatory Visit (INDEPENDENT_AMBULATORY_CARE_PROVIDER_SITE_OTHER): Payer: Medicare Other | Admitting: Internal Medicine

## 2017-03-16 VITALS — BP 136/74 | HR 97 | Temp 97.7°F | Ht 68.0 in | Wt 243.4 lb

## 2017-03-16 DIAGNOSIS — Z853 Personal history of malignant neoplasm of breast: Secondary | ICD-10-CM

## 2017-03-16 DIAGNOSIS — E1142 Type 2 diabetes mellitus with diabetic polyneuropathy: Secondary | ICD-10-CM

## 2017-03-16 DIAGNOSIS — R350 Frequency of micturition: Secondary | ICD-10-CM | POA: Diagnosis not present

## 2017-03-16 DIAGNOSIS — Z5181 Encounter for therapeutic drug level monitoring: Secondary | ICD-10-CM | POA: Diagnosis not present

## 2017-03-16 DIAGNOSIS — I10 Essential (primary) hypertension: Secondary | ICD-10-CM

## 2017-03-16 DIAGNOSIS — Z7901 Long term (current) use of anticoagulants: Secondary | ICD-10-CM | POA: Diagnosis not present

## 2017-03-16 DIAGNOSIS — E7800 Pure hypercholesterolemia, unspecified: Secondary | ICD-10-CM | POA: Diagnosis not present

## 2017-03-16 DIAGNOSIS — E119 Type 2 diabetes mellitus without complications: Secondary | ICD-10-CM

## 2017-03-16 DIAGNOSIS — R9389 Abnormal findings on diagnostic imaging of other specified body structures: Secondary | ICD-10-CM

## 2017-03-16 DIAGNOSIS — R0981 Nasal congestion: Secondary | ICD-10-CM

## 2017-03-16 LAB — PROTIME-INR
INR: 2.6 ratio — AB (ref 0.8–1.0)
Prothrombin Time: 27.6 s — ABNORMAL HIGH (ref 9.6–13.1)

## 2017-03-16 NOTE — Progress Notes (Signed)
Pre visit review using our clinic review tool, if applicable. No additional management support is needed unless otherwise documented below in the visit note. 

## 2017-03-16 NOTE — Progress Notes (Signed)
[name], female   DOB: [birth year], 77 y.o.   MRN: [id number]   Subjective:    [name], female    DOB: 06/03/1940, 77 y.o.   MRN: [id number]  HPI  Patient here for a scheduled follow up.  She reports she is feeling better.  Minimal drainage in her throat.  No chest congestion. No fever. No sinus pressure.  Minimal nasal congestion.  No sob.  No chest pain.  No acid reflux.  No abdominal pain.  Bowels moving.  Seeing urology.  Placed on [place].  Trial of this medication and then will try a generic.  May be helping some.  Overall she feels she is doing relatively well.     Past Medical History:  Diagnosis Date  . Anemia   . Arthritis   . Atrial fibrillation ([place])   . Breast cancer ([place])    s/p lumpectomy 1992.  s/p chemo and xrt left breast  . Diabetes mellitus ([place])   . Edema    feet/legs  . Gastric ulcer   . GERD (gastroesophageal reflux disease)   . HOH (hard of hearing)    aides  . Hypercholesterolemia   . Hypertension   . Pulmonary emboli [facility])    Past Surgical History:  Procedure Laterality Date  . BREAST LUMPECTOMY  03/06/1990   left breast  . CATARACT EXTRACTION W/PHACO Right 12/26/2016   Procedure: CATARACT EXTRACTION PHACO AND INTRAOCULAR LENS PLACEMENT (IOC)-RIGHT DIABETIC;  Surgeon: Birder Robson, MD;  Location: [facility] ORS;  Service: Ophthalmology;  Laterality: Right;  [place] 00:47 AP% 24.5 CDE 11.62 Fluid pack lot # [id number]  . CATARACT EXTRACTION W/PHACO Left 01/23/2017   Procedure: CATARACT EXTRACTION PHACO AND INTRAOCULAR LENS PLACEMENT (IOC);  Surgeon: Birder Robson, MD;  Location: [facility] ORS;  Service: Ophthalmology;  Laterality: Left;  [place] 00:50 AP% 16.1 CDE 8.18 Fluid pack lot #[id number] H   Family History  Problem Relation Age of Onset  . Heart disease Father   . Heart disease Brother        s/p CABG  . Colon cancer Neg Hx   . Breast cancer Neg Hx    Social History   Socioeconomic History  . Marital  status: Married    Spouse name: None  . Number of children: None  . Years of education: None  . Highest education level: None  Social Needs  . Financial resource strain: None  . Food insecurity - worry: None  . Food insecurity - inability: None  . Transportation needs - medical: None  . Transportation needs - non-medical: None  Occupational History  . None  Tobacco Use  . Smoking status: Never Smoker  . Smokeless tobacco: Never Used  Substance and Sexual Activity  . Alcohol use: No    Alcohol/week: 0.0 oz  . Drug use: No  . Sexual activity: None  Other Topics Concern  . None  Social History Narrative  . None    Outpatient Encounter Medications as of 03/16/2017  Medication Sig  . acetaminophen (TYLENOL) 500 MG tablet Take 500 mg by mouth 2 (two) times daily as needed for mild pain.  . bisacodyl (DULCOLAX) 5 MG EC tablet Take 5 mg daily as needed by mouth for mild constipation or moderate constipation.  . Difluprednate (DUREZOL) 0.05 % EMUL Place 1 drop 2 (two) times daily into the right eye.  . diphenhydrAMINE (BENADRYL) 25 mg capsule Take 25 mg at bedtime as needed by mouth for sleep.   Marland Kitchen  ferrous sulfate 325 (65 FE) MG tablet TAKE ONE (1) TABLET BY MOUTH EVERY DAY (Patient taking differently: Take 325 mg daily with supper by mouth. )  . fesoterodine (TOVIAZ) 8 MG TB24 tablet Take 1 tablet (8 mg total) by mouth daily.  Marland Kitchen glucose blood (BAYER CONTOUR TEST) test strip Check one time day  Dx E11.9  . losartan (COZAAR) 100 MG tablet Take 1 tablet (100 mg total) daily by mouth.  . mirabegron ER (MYRBETRIQ) 50 MG TB24 tablet Take 1 tablet (50 mg total) by mouth daily.  . Multiple Vitamin (MULTIVITAMIN WITH MINERALS) TABS tablet Take 1 tablet daily by mouth.  . nepafenac (ILEVRO) 0.3 % ophthalmic suspension Place 1 drop daily into the right eye.  Marland Kitchen oxybutynin (DITROPAN-XL) 10 MG 24 hr tablet Take 1 tablet (10 mg total) by mouth daily.  . pantoprazole (PROTONIX) 40 MG tablet Take 1  tablet (40 mg total) by mouth daily. (Patient taking differently: Take 40 mg by mouth every other day. )  . simvastatin (ZOCOR) 10 MG tablet TAKE ONE (1) TABLET BY MOUTH EVERY DAY (Patient taking differently: Take 10 mg daily at 6 PM by mouth. )  . warfarin (COUMADIN) 2.5 MG tablet TAKE 2 TABLETS BY MOUTH ON TUESDAY, THURSDAY, SATURDAY, AND SUNDAY. TAKE 1 TABLET ON ALL OTHER DAYS (Patient taking differently: Take 2.5-5 mg See admin instructions by mouth. Takes 2 tablets (5 mg) on Tuesdays and Thursdays and 1 tablet (2.5 mg)  all other days of the week)   No facility-administered encounter medications on file as of 03/16/2017.     Review of Systems  Constitutional: Negative for appetite change and unexpected weight change.  HENT: Negative for sinus pressure.        Minimal congestion as outlined.    Respiratory: Negative for cough, chest tightness and shortness of breath.   Cardiovascular: Negative for chest pain, palpitations and leg swelling.  Gastrointestinal: Negative for abdominal pain, diarrhea, nausea and vomiting.  Genitourinary: Negative for difficulty urinating and dysuria.  Musculoskeletal: Negative for joint swelling and myalgias.  Skin: Negative for color change and rash.  Neurological: Negative for dizziness, light-headedness and headaches.  Psychiatric/Behavioral: Negative for agitation and dysphoric mood.       Objective:    Physical Exam  Constitutional: She appears well-developed and well-nourished. No distress.  HENT:  Nose: Nose normal.  Mouth/Throat: Oropharynx is clear and moist.  Neck: Neck supple. No thyromegaly present.  Cardiovascular: Normal rate and regular rhythm.  Pulmonary/Chest: Breath sounds normal. No respiratory distress. She has no wheezes.  Abdominal: Soft. Bowel sounds are normal. There is no tenderness.  Musculoskeletal: She exhibits no edema or tenderness.  Lymphadenopathy:    She has no cervical adenopathy.  Skin: No rash noted. No erythema.    Psychiatric: She has a normal mood and affect. Her behavior is normal.    BP 136/74   Pulse 97   Temp 97.7 F (36.5 C) (Oral)   Ht '5\' 8"'  (1.727 m)   Wt 243 lb 6.4 oz (110.4 kg)   SpO2 95%   BMI 37.01 kg/m  Wt Readings from Last 3 Encounters:  03/16/17 243 lb 6.4 oz (110.4 kg)  03/12/17 239 lb (108.4 kg)  03/02/17 239 lb 1.9 oz (108.5 kg)     Lab Results  Component Value Date   WBC 7.9 11/07/2016   HGB 15.0 11/07/2016   HCT 45.0 11/07/2016   PLT 206.0 11/07/2016   GLUCOSE 104 (H) 01/31/2017   CHOL 131 09/15/2016  TRIG 104.0 09/15/2016   HDL 49.00 09/15/2016   LDLCALC 61 09/15/2016   ALT 19 10/30/2016   AST 22 10/30/2016   NA 138 01/31/2017   K 4.2 01/31/2017   CL 103 01/31/2017   CREATININE 0.94 01/31/2017   BUN 16 01/31/2017   CO2 28 01/31/2017   TSH 2.55 01/21/2016   INR 2.6 (H) 03/16/2017   HGBA1C 5.8 09/08/2016   MICROALBUR <0.7 01/21/2016       Assessment & Plan:   Problem List Items Addressed This Visit    Abnormal chest CT    CT as outlined.  Recommended f/u CT.  Currently breathing well.  Saw pulmonary.  Recommended f/u CT scan in 6 months. Is ordered.        Diabetic polyneuropathy associated with type 2 diabetes mellitus ([place])   History of breast cancer    Mammogram 11/30/16 - Birads I.       Hypercholesteremia    On simvastatin.  Low cholesterol diet and exercise.  Follow lipid panel and liver function tests.        Hypertension    Blood pressure under good control.  Continue same medication regimen.  Follow pressures.  Follow metabolic panel.        Type 2 diabetes mellitus without complications ([facility])    Low carb diet and exercise.  Follow met b and a1c.        Urinary frequency    Seeing urology.  Undergoing trial of medication.  Has f/u planned.         Other Visit Diagnoses    Anticoagulated on Coumadin    -  Primary   Relevant Orders   Protime-INR (Completed)   Congestion of nasal sinus       Minimal congestion.   robitussin and nasacort as directed.  follow.         Einar Pheasant, MD

## 2017-03-18 ENCOUNTER — Encounter: Payer: Self-pay | Admitting: Internal Medicine

## 2017-03-18 DIAGNOSIS — E1142 Type 2 diabetes mellitus with diabetic polyneuropathy: Secondary | ICD-10-CM | POA: Insufficient documentation

## 2017-03-18 DIAGNOSIS — R9389 Abnormal findings on diagnostic imaging of other specified body structures: Secondary | ICD-10-CM | POA: Insufficient documentation

## 2017-03-18 NOTE — Assessment & Plan Note (Signed)
Blood pressure under good control.  Continue same medication regimen.  Follow pressures.  Follow metabolic panel.   

## 2017-03-18 NOTE — Assessment & Plan Note (Addendum)
CT as outlined.  Recommended f/u CT.  Currently breathing well.  Saw pulmonary.  Recommended f/u CT scan in 6 months. Is ordered.

## 2017-03-18 NOTE — Assessment & Plan Note (Signed)
Seeing urology.  Undergoing trial of medication.  Has f/u planned.

## 2017-03-18 NOTE — Assessment & Plan Note (Signed)
Mammogram 11/30/16 - Birads I.

## 2017-03-18 NOTE — Assessment & Plan Note (Signed)
On simvastatin.  Low cholesterol diet and exercise.  Follow lipid panel and liver function tests.   

## 2017-03-18 NOTE — Assessment & Plan Note (Signed)
Low carb diet and exercise.  Follow met b and a1c.   

## 2017-03-26 ENCOUNTER — Ambulatory Visit: Payer: Medicare Other | Admitting: Podiatry

## 2017-03-26 ENCOUNTER — Other Ambulatory Visit (INDEPENDENT_AMBULATORY_CARE_PROVIDER_SITE_OTHER): Payer: Medicare Other

## 2017-03-26 DIAGNOSIS — Z7901 Long term (current) use of anticoagulants: Secondary | ICD-10-CM

## 2017-03-26 LAB — PROTIME-INR
INR: 2 ratio — AB (ref 0.8–1.0)
PROTHROMBIN TIME: 21.9 s — AB (ref 9.6–13.1)

## 2017-03-29 ENCOUNTER — Encounter: Payer: Self-pay | Admitting: Podiatry

## 2017-03-29 ENCOUNTER — Ambulatory Visit (INDEPENDENT_AMBULATORY_CARE_PROVIDER_SITE_OTHER): Payer: Medicare Other | Admitting: Podiatry

## 2017-03-29 DIAGNOSIS — M79609 Pain in unspecified limb: Principal | ICD-10-CM

## 2017-03-29 DIAGNOSIS — E1142 Type 2 diabetes mellitus with diabetic polyneuropathy: Secondary | ICD-10-CM | POA: Diagnosis not present

## 2017-03-29 DIAGNOSIS — M79676 Pain in unspecified toe(s): Secondary | ICD-10-CM | POA: Diagnosis not present

## 2017-03-29 DIAGNOSIS — E119 Type 2 diabetes mellitus without complications: Secondary | ICD-10-CM

## 2017-03-29 DIAGNOSIS — B351 Tinea unguium: Secondary | ICD-10-CM

## 2017-03-29 DIAGNOSIS — D689 Coagulation defect, unspecified: Secondary | ICD-10-CM

## 2017-03-29 NOTE — Progress Notes (Signed)
Complaint:  Visit Type: Patient returns to my office for continued preventative foot care services. Complaint: Patient states" my nails have grown long and thick and become painful to walk and wear shoes" Patient has been diagnosed with DM with neuropathy. The patient presents for preventative foot care services. No changes to ROS.  Patient is taking coumadin.  Podiatric Exam: Vascular: dorsalis pedis  are palpable bilateral. Posterior tibial pulses are non palpable due to ankle/leg swelling. Capillary return is immediate. Temperature gradient is WNL. Skin turgor WNL  Sensorium: Diminished  Semmes Weinstein monofilament test. Normal tactile sensation bilaterally. Nail Exam: Pt has thick disfigured discolored nails with subungual debris noted bilateral entire nail hallux through fifth toenails.  Pincer hallux nails. Ulcer Exam: There is no evidence of ulcer or pre-ulcerative changes or infection. Orthopedic Exam: Muscle tone and strength are WNL. No limitations in general ROM. No crepitus or effusions noted. Foot type and digits show no abnormalities. Bony prominences are unremarkable. Skin: No Porokeratosis. No infection or ulcers  Diagnosis:  Onychomycosis, , Pain in right toe, pain in left toes  Treatment & Plan Procedures and Treatment: Consent by patient was obtained for treatment procedures. The patient understood the discussion of treatment and procedures well. All questions were answered thoroughly reviewed. Debridement of mycotic and hypertrophic toenails, 1 through 5 bilateral and clearing of subungual debris. No ulceration, no infection noted. Signed ABN for 2019. Return Visit-Office Procedure: Patient instructed to return to the office for a follow up visit 3 months for continued evaluation and treatment.    Markel Mergenthaler DPM 

## 2017-04-03 DIAGNOSIS — C44329 Squamous cell carcinoma of skin of other parts of face: Secondary | ICD-10-CM | POA: Diagnosis not present

## 2017-04-10 ENCOUNTER — Telehealth: Payer: Self-pay | Admitting: Internal Medicine

## 2017-04-10 ENCOUNTER — Other Ambulatory Visit (INDEPENDENT_AMBULATORY_CARE_PROVIDER_SITE_OTHER): Payer: Medicare Other

## 2017-04-10 DIAGNOSIS — Z7901 Long term (current) use of anticoagulants: Secondary | ICD-10-CM | POA: Diagnosis not present

## 2017-04-10 LAB — PROTIME-INR
INR: 2 ratio — AB (ref 0.8–1.0)
PROTHROMBIN TIME: 21.5 s — AB (ref 9.6–13.1)

## 2017-04-10 NOTE — Telephone Encounter (Signed)
LMOAM for pt to return call. Will return call again after 3:00 pm as phone note indicates. Rhonda J Cobb

## 2017-04-10 NOTE — Telephone Encounter (Signed)
Patient returned call and CT Chest and ROV has been scheduled. Pt aware of appointments and also appointments mailed to patient. Nothing else needed at this time. Rhonda J Cobb

## 2017-04-10 NOTE — Telephone Encounter (Signed)
Patient returning call to set up appt for test  Please call around 3pm

## 2017-04-11 ENCOUNTER — Other Ambulatory Visit: Payer: Self-pay | Admitting: Radiology

## 2017-04-11 DIAGNOSIS — Z7901 Long term (current) use of anticoagulants: Principal | ICD-10-CM

## 2017-04-11 DIAGNOSIS — Z5181 Encounter for therapeutic drug level monitoring: Secondary | ICD-10-CM

## 2017-04-15 ENCOUNTER — Other Ambulatory Visit: Payer: Self-pay | Admitting: Internal Medicine

## 2017-04-16 ENCOUNTER — Other Ambulatory Visit: Payer: Self-pay | Admitting: Internal Medicine

## 2017-04-26 ENCOUNTER — Other Ambulatory Visit: Payer: Self-pay

## 2017-04-26 MED ORDER — SIMVASTATIN 10 MG PO TABS: ORAL_TABLET | ORAL | 1 refills | Status: DC

## 2017-05-02 ENCOUNTER — Ambulatory Visit: Payer: Medicare Other

## 2017-05-02 ENCOUNTER — Ambulatory Visit
Admission: RE | Admit: 2017-05-02 | Discharge: 2017-05-02 | Disposition: A | Discharge status: Home / Self Care | Payer: Medicare Other | Source: Ambulatory Visit | Attending: Internal Medicine | Admitting: Internal Medicine

## 2017-05-02 DIAGNOSIS — R918 Other nonspecific abnormal finding of lung field: Secondary | ICD-10-CM

## 2017-05-02 DIAGNOSIS — I700 Atherosclerosis of aorta: Secondary | ICD-10-CM | POA: Insufficient documentation

## 2017-05-02 DIAGNOSIS — R911 Solitary pulmonary nodule: Secondary | ICD-10-CM | POA: Diagnosis not present

## 2017-05-02 DIAGNOSIS — K449 Diaphragmatic hernia without obstruction or gangrene: Secondary | ICD-10-CM | POA: Diagnosis not present

## 2017-05-07 ENCOUNTER — Encounter: Payer: Self-pay | Admitting: Urology

## 2017-05-07 ENCOUNTER — Ambulatory Visit (INDEPENDENT_AMBULATORY_CARE_PROVIDER_SITE_OTHER): Payer: Medicare Other | Admitting: Urology

## 2017-05-07 VITALS — BP 167/78 | HR 81 | Ht 68.0 in | Wt 241.4 lb

## 2017-05-07 DIAGNOSIS — N3946 Mixed incontinence: Secondary | ICD-10-CM | POA: Diagnosis not present

## 2017-05-07 NOTE — Progress Notes (Signed)
05/07/2017 3:02 PM   Anita Walker 27-Aug-1940 [id number]  Referring provider: Einar Pheasant, [address] [place], [place] [address]  Chief Complaint  Patient presents with  . Urinary Incontinence    HPI: I was consulted to assess the patient's urgency incontinence which primarily occurs with foot on the floor syndrome in the middle of the night.  Less frequently during the day she can have urgency incontinence and denies stress incontinence.  At night she wears 1 pad that is moderately wet  She voids every 2 hours during the day but sometimes every 15-30 minutes after fluids.  She gets up 3 times at night  pelvic examination was limited by her immobility.  She had a grade 2 cystocele that almost reached the introitus.  She had no stress incontinence  The patient primarily has urgency incontinence with mild frequency and nocturia.  She likely has a component of a nocturnal diuresis.  If overactive bladder medications did not reach her treatment goal desmopressin would also be an option.  I may need to cystoscope her in the future but she had no blood in the urine today and I sent it for culture  I will reassess the patient in 4-6 weeks on the Myrbetriq 50 mg samples and prescription.    Today Frequency stable.  Last urine culture negative  The patient has failed Toviaz oxybutynin and Myrbetriq.  I talked about Botox and percutaneous tibial nerve stimulation in detail.  She uses a walker and she 66.  She does have some obesity.  I did not bring up InterStim     PMH: Past Medical History:  Diagnosis Date  . Anemia   . Arthritis   . Atrial fibrillation ([place])   . Breast cancer ([place])    s/p lumpectomy 1992.  s/p chemo and xrt left breast  . Diabetes mellitus ([place])   . Edema    feet/legs  . Gastric ulcer   . GERD (gastroesophageal reflux disease)   . HOH (hard of hearing)    aides  . Hypercholesterolemia   . Hypertension   .  Pulmonary emboli [facility])     Surgical History: Past Surgical History:  Procedure Laterality Date  . BREAST LUMPECTOMY  03/06/1990   left breast  . CATARACT EXTRACTION W/PHACO Right 12/26/2016   Procedure: CATARACT EXTRACTION PHACO AND INTRAOCULAR LENS PLACEMENT (IOC)-RIGHT DIABETIC;  Surgeon: Birder Robson, MD;  Location: [facility] ORS;  Service: Ophthalmology;  Laterality: Right;  [place] 00:47 AP% 24.5 CDE 11.62 Fluid pack lot # [id number]  . CATARACT EXTRACTION W/PHACO Left 01/23/2017   Procedure: CATARACT EXTRACTION PHACO AND INTRAOCULAR LENS PLACEMENT (IOC);  Surgeon: Birder Robson, MD;  Location: [facility] ORS;  Service: Ophthalmology;  Laterality: Left;  [place] 00:50 AP% 16.1 CDE 8.18 Fluid pack lot #[id number]    Home Medications:  Allergies as of 05/07/2017      Reactions   Penicillins Other (See Comments)   Unknown- pt states been a long time ago  Has patient had a PCN reaction causing immediate rash, facial/tongue/throat swelling, SOB or lightheadedness with hypotension: Unknown Has patient had a PCN reaction causing severe rash involving mucus membranes or skin necrosis: Unknown Has patient had a PCN reaction that required hospitalization: Unknown Has patient had a PCN reaction occurring within the last 10 years: Unknown If all of the above answers are "NO", then may proceed with Cephalosporin use.   Penicillin V Potassium Nausea And Vomiting      Medication List  Accurate as of 05/07/17  3:02 PM. Always use your most recent med list.          acetaminophen 500 MG tablet Commonly known as:  TYLENOL Take 500 mg by mouth 2 (two) times daily as needed for mild pain.   bisacodyl 5 MG EC tablet Commonly known as:  DULCOLAX Take 5 mg daily as needed by mouth for mild constipation or moderate constipation.   diphenhydrAMINE 25 mg capsule Commonly known as:  BENADRYL Take 25 mg at bedtime as needed by mouth for sleep.   DUREZOL 0.05 % Emul Generic drug:  Difluprednate Place  1 drop 2 (two) times daily into the right eye.   ferrous sulfate 325 (65 FE) MG tablet TAKE ONE (1) TABLET BY MOUTH EVERY DAY   fesoterodine 8 MG Tb24 tablet Commonly known as:  TOVIAZ Take 1 tablet (8 mg total) by mouth daily.   glucose blood test strip Commonly known as:  BAYER CONTOUR TEST Check one time day  Dx E11.9   ILEVRO 0.3 % ophthalmic suspension Generic drug:  nepafenac Place 1 drop daily into the right eye.   losartan 100 MG tablet Commonly known as:  COZAAR TAKE 1 TABLET BY MOUTH EVERY DAY   mirabegron ER 50 MG Tb24 tablet Commonly known as:  MYRBETRIQ Take 1 tablet (50 mg total) by mouth daily.   multivitamin with minerals Tabs tablet Take 1 tablet daily by mouth.   oxybutynin 10 MG 24 hr tablet Commonly known as:  DITROPAN-XL Take 1 tablet (10 mg total) by mouth daily.   pantoprazole 40 MG tablet Commonly known as:  PROTONIX Take 1 tablet (40 mg total) by mouth daily.   simvastatin 10 MG tablet Commonly known as:  ZOCOR TAKE ONE (1) TABLET BY MOUTH EVERY DAY   warfarin 2.5 MG tablet Commonly known as:  COUMADIN TAKE 2 TABLETS BY MOUTH ON TUESDAY, THURSDAY, SATURDAY, AND SUNDAY. TAKE 1 TABLET ON ALL OTHER DAYS       Allergies:  Allergies  Allergen Reactions  . Penicillins Other (See Comments)    Unknown- pt states been a long time ago  Has patient had a PCN reaction causing immediate rash, facial/tongue/throat swelling, SOB or lightheadedness with hypotension: Unknown Has patient had a PCN reaction causing severe rash involving mucus membranes or skin necrosis: Unknown Has patient had a PCN reaction that required hospitalization: Unknown Has patient had a PCN reaction occurring within the last 10 years: Unknown If all of the above answers are "NO", then may proceed with Cephalosporin use.  Marland Kitchen Penicillin V Potassium Nausea And Vomiting    Family History: Family History  Problem Relation Age of Onset  . Heart disease Father   . Heart  disease Brother        s/p CABG  . Colon cancer Neg Hx   . Breast cancer Neg Hx     Social History:  reports that  has never smoked. she has never used smokeless tobacco. She reports that she does not drink alcohol or use drugs.  ROS: UROLOGY Frequent Urination?: Yes Hard to postpone urination?: No Burning/pain with urination?: No Get up at night to urinate?: Yes Leakage of urine?: Yes Urine stream starts and stops?: Yes Trouble starting stream?: No Do you have to strain to urinate?: No Blood in urine?: No Urinary tract infection?: No Sexually transmitted disease?: No Injury to kidneys or bladder?: No Painful intercourse?: No Weak stream?: No Currently pregnant?: No Vaginal bleeding?: No Last menstrual period?: n  Gastrointestinal Nausea?: No Vomiting?: No Indigestion/heartburn?: No Diarrhea?: No Constipation?: No  Constitutional Fever: No Night sweats?: No Weight loss?: No Fatigue?: No  Skin Skin rash/lesions?: No Itching?: No  Eyes Blurred vision?: No Double vision?: No  Ears/Nose/Throat Sore throat?: No Sinus problems?: No  Hematologic/Lymphatic Swollen glands?: No Easy bruising?: No  Cardiovascular Leg swelling?: No Chest pain?: No  Respiratory Cough?: No Shortness of breath?: No  Endocrine Excessive thirst?: No  Musculoskeletal Back pain?: No Joint pain?: No  Neurological Headaches?: No Dizziness?: No  Psychologic Depression?: No Anxiety?: No  Physical Exam: BP (!) 167/78 (BP Location: Right Arm, Patient Position: Sitting, Cuff Size: Large)   Pulse 81   Ht 5\' 8"  (1.727 m)   Wt 109.5 kg (241 lb 6.4 oz)   BMI 36.70 kg/m    Laboratory Data: Lab Results  Component Value Date   WBC 7.9 11/07/2016   HGB 15.0 11/07/2016   HCT 45.0 11/07/2016   MCV 91.4 11/07/2016   PLT 206.0 11/07/2016    Lab Results  Component Value Date   CREATININE 0.94 01/31/2017    No results found for: PSA  No results found for:  TESTOSTERONE  Lab Results  Component Value Date   HGBA1C 5.8 09/08/2016    Urinalysis    Component Value Date/Time   COLORURINE YELLOW (A) 11/02/2016 2216   APPEARANCEUR Clear 01/29/2017 1403   LABSPEC 1.029 11/02/2016 2216   LABSPEC 1.008 07/16/2013 1405   PHURINE 6.0 11/02/2016 2216   GLUCOSEU Negative 01/29/2017 1403   GLUCOSEU Negative 07/16/2013 1405   HGBUR NEGATIVE 11/02/2016 2216   BILIRUBINUR Negative 01/29/2017 1403   BILIRUBINUR Negative 07/16/2013 1405   KETONESUR 5 (A) 11/02/2016 2216   PROTEINUR Negative 01/29/2017 1403   PROTEINUR NEGATIVE 11/02/2016 2216   UROBILINOGEN 0.2 07/18/2013 0948   NITRITE Negative 01/29/2017 1403   NITRITE NEGATIVE 11/02/2016 2216   LEUKOCYTESUR 1+ (A) 01/29/2017 1403   LEUKOCYTESUR 3+ 07/16/2013 1405    Pertinent Imaging:   Assessment & Plan: The patient was given a handout and will start percutaneous tibial nerve stimulation  There are no diagnoses linked to this encounter.  No Follow-up on file.  Reece Packer, MD  [facility] [address] [place], [place] [address] [phone]

## 2017-05-07 NOTE — Progress Notes (Signed)
[facility] Consultation      Assessment and Plan:  77 year old female with history of pulmonary embolism on Coumadin, GERD with large hiatal hernia, history of breast cancer in remission. Found to have small lung nodules.  Lung nodules. -Discussed with patient and daughter, currently these are to small to characterize by PET scanning, and too small to biopsy. The nodules appear to be low risk, patient's history is intermediate risk given her history of secondhand smoke exposure. She has a history of breast cancer, but it is remote, and the likelihood of breast cancer recurrence at this time is very low. -lung nodules have been stable since 2010, no need for continued surveillance, pt in agreement.   GERD/hiatal hernia/intrathoracic stomach. -Discussed her current GERD and hiatal hernia. Currently, she is minimally symptomatic, taking pantoprazole every other day, which appears to be appropriate at this time.  Emphysema. -Hyperinflation consistent with emphysema seen on CT chest, however, the patient is not symptomatic from this. The patient has no history of smoking, but does have secondhand smoke exposure from her husband who smoked up to 3 packs per day. -Given lack of symptoms, no treatment is necessary at this time.  Date: 05/07/2017  MRN# [id number] Anita Walker 02/13/1941    Anita Walker is a 77 y.o. old female seen in consultation for chief complaint of:    Chief Complaint  Patient presents with  . Emphysema    pt here for f/u of pulmonary nodule found on CT. Patient reports still not having any sx.    HPI:   The patient is a 77 year old female, recently discharged from the hospital on 11/02/2016, after a three-day admission for right leg pain. She is known to have a history of pulmonary embolism on Coumadin, GERD, atrial fibrillation, breast cancer status post chemotherapy in remission. She presented back to the ED on 11/02/2016, the day  of discharge, with syncope.  Since that time she things that she is doing better, she is able to take a shower, she is sleeping in her recliner and using a bedside commode. Otherwise she is ambulating around her home without issue. She feels that her breathing has been pretty good. She is not a smoker but her husband smoked. Her brca is remote, she has a lumpectomy in '92, and then chemo and rads, has been in remission.  Currently she has no respiratory symptoms, she volunteers 5 days per week at [employer]. No hemoptysis. She denies significant weight loss.  She takes protonix every other day she has no side effects from it.   **CBC, 10/30/2016;Eos=0.   **Images personally reviewed; CT chest 05/02/17; 11/02/2016; 01/31/09 and chest x-ray 10/24/2016; consistent with hyperinflation and emphysema, large hiatal hernia/intrathoracic stomach. Multiple bilateral nodules, 60m LLL and sup segment RLL nodule is unchanged since 2010. Hiatal hernia unchanged from 2010.   Medication:    Current Outpatient Medications:  .  acetaminophen (TYLENOL) 500 MG tablet, Take 500 mg by mouth 2 (two) times daily as needed for mild pain., Disp: , Rfl:  .  bisacodyl (DULCOLAX) 5 MG EC tablet, Take 5 mg daily as needed by mouth for mild constipation or moderate constipation., Disp: , Rfl:  .  Difluprednate (DUREZOL) 0.05 % EMUL, Place 1 drop 2 (two) times daily into the right eye., Disp: , Rfl:  .  diphenhydrAMINE (BENADRYL) 25 mg capsule, Take 25 mg at bedtime as needed by mouth for sleep. , Disp: , Rfl:  .  ferrous sulfate  325 (65 FE) MG tablet, TAKE ONE (1) TABLET BY MOUTH EVERY DAY (Patient taking differently: Take 325 mg daily with supper by mouth. ), Disp: 90 tablet, Rfl: 3 .  fesoterodine (TOVIAZ) 8 MG TB24 tablet, Take 1 tablet (8 mg total) by mouth daily., Disp: 30 tablet, Rfl: 11 .  glucose blood (BAYER CONTOUR TEST) test strip, Check one time day  Dx E11.9, Disp: 50 each, Rfl: 12 .  losartan (COZAAR)  100 MG tablet, TAKE 1 TABLET BY MOUTH EVERY DAY, Disp: 30 tablet, Rfl: 3 .  mirabegron ER (MYRBETRIQ) 50 MG TB24 tablet, Take 1 tablet (50 mg total) by mouth daily., Disp: 30 tablet, Rfl: 11 .  Multiple Vitamin (MULTIVITAMIN WITH MINERALS) TABS tablet, Take 1 tablet daily by mouth., Disp: , Rfl:  .  nepafenac (ILEVRO) 0.3 % ophthalmic suspension, Place 1 drop daily into the right eye., Disp: , Rfl:  .  oxybutynin (DITROPAN-XL) 10 MG 24 hr tablet, Take 1 tablet (10 mg total) by mouth daily., Disp: 30 tablet, Rfl: 11 .  pantoprazole (PROTONIX) 40 MG tablet, Take 1 tablet (40 mg total) by mouth daily. (Patient taking differently: Take 40 mg by mouth every other day. ), Disp: 90 tablet, Rfl: 1 .  simvastatin (ZOCOR) 10 MG tablet, TAKE ONE (1) TABLET BY MOUTH EVERY DAY, Disp: 90 tablet, Rfl: 1 .  warfarin (COUMADIN) 2.5 MG tablet, TAKE 2 TABLETS BY MOUTH ON TUESDAY, THURSDAY, SATURDAY, AND SUNDAY. TAKE 1 TABLET ON ALL OTHER DAYS (Patient taking differently: Take 2.5-5 mg See admin instructions by mouth. Takes 2 tablets (5 mg) on Tuesdays and Thursdays and 1 tablet (2.5 mg)  all other days of the week), Disp: 120 tablet, Rfl: 1   Allergies:  Penicillins and Penicillin v potassium  Review of Systems: Gen:  Denies  fever, sweats, chills HEENT: Denies blurred vision, double vision. bleeds, sore throat Cvc:  No dizziness, chest pain. Resp:   Denies cough or sputum production, shortness of breath Gi: Denies swallowing difficulty, stomach pain. Gu:  Denies bladder incontinence, burning urine Ext:   No Joint pain, stiffness. Skin: No skin rash,  hives  Endoc:  No polyuria, polydipsia. Psych: No depression, insomnia. Other:  All other systems were reviewed with the patient and were negative other that what is mentioned in the HPI.   Physical Examination:   VS: BP 118/82 (BP Location: Left Arm, Cuff Size: Normal)   Pulse 71   Resp 16   Ht _0  (1.727 m)   Wt 243 lb (110.2 kg)   SpO2 97%   BMI  36.95 kg/m   General Appearance: No distress  Neuro:without focal findings,  speech normal,  HEENT: PERRLA, EOM intact.   Pulmonary: normal breath sounds, No wheezing.  CardiovascularNormal S1,S2.  No m/r/g.   Abdomen: Benign, Soft, non-tender. Renal:  No costovertebral tenderness  GU:  No performed at this time. Endoc: No evident thyromegaly, no signs of acromegaly. Skin:   warm, no rashes, no ecchymosis  Extremities: normal, no cyanosis, clubbing.  Other findings:    LABORATORY PANEL:   CBC No results for input(s): WBC, HGB, HCT, PLT in the last 168 hours. ------------------------------------------------------------------------------------------------------------------  Chemistries  No results for input(s): NA, K, CL, CO2, GLUCOSE, BUN, CREATININE, CALCIUM, MG, AST, ALT, ALKPHOS, BILITOT in the last 168 hours.  Invalid input(s): GFRCGP ------------------------------------------------------------------------------------------------------------------  Cardiac Enzymes No results for input(s): TROPONINI in the last 168 hours. ------------------------------------------------------------  RADIOLOGY:  No results found.     Thank  you for  the consultation and for allowing [facility] to assist in the care of your patient. Our recommendations are noted above.  Please contact us if we can be of further service.   Marda Stalker, MD.  Board Certified in Internal Medicine, Pulmonary Medicine, [place], and Sleep Medicine.  [facility] Office Number: [phone]  Patricia Pesa, M.D.  Merton Border, M.D  05/07/2017

## 2017-05-08 DIAGNOSIS — Z85820 Personal history of malignant melanoma of skin: Secondary | ICD-10-CM | POA: Diagnosis not present

## 2017-05-08 DIAGNOSIS — Z859 Personal history of malignant neoplasm, unspecified: Secondary | ICD-10-CM | POA: Diagnosis not present

## 2017-05-08 DIAGNOSIS — Z85828 Personal history of other malignant neoplasm of skin: Secondary | ICD-10-CM | POA: Diagnosis not present

## 2017-05-08 DIAGNOSIS — L578 Other skin changes due to chronic exposure to nonionizing radiation: Secondary | ICD-10-CM | POA: Diagnosis not present

## 2017-05-09 ENCOUNTER — Encounter: Payer: Self-pay | Admitting: Internal Medicine

## 2017-05-09 ENCOUNTER — Ambulatory Visit (INDEPENDENT_AMBULATORY_CARE_PROVIDER_SITE_OTHER): Payer: Medicare Other | Admitting: Internal Medicine

## 2017-05-09 VITALS — BP 118/82 | HR 71 | Resp 16 | Ht 68.0 in | Wt 243.0 lb

## 2017-05-09 DIAGNOSIS — R918 Other nonspecific abnormal finding of lung field: Secondary | ICD-10-CM

## 2017-05-09 NOTE — Patient Instructions (Signed)
You lung nodules are unchanged in size over several years, there is no further need to continue CT scans of your lungs.

## 2017-05-11 ENCOUNTER — Other Ambulatory Visit (INDEPENDENT_AMBULATORY_CARE_PROVIDER_SITE_OTHER): Payer: Medicare Other

## 2017-05-11 DIAGNOSIS — Z5181 Encounter for therapeutic drug level monitoring: Secondary | ICD-10-CM

## 2017-05-11 DIAGNOSIS — Z7901 Long term (current) use of anticoagulants: Secondary | ICD-10-CM

## 2017-05-11 LAB — PROTIME-INR
INR: 2.3 ratio — AB (ref 0.8–1.0)
PROTHROMBIN TIME: 24.8 s — AB (ref 9.6–13.1)

## 2017-05-16 ENCOUNTER — Ambulatory Visit (INDEPENDENT_AMBULATORY_CARE_PROVIDER_SITE_OTHER): Payer: Medicare Other | Admitting: Urology

## 2017-05-16 DIAGNOSIS — R32 Unspecified urinary incontinence: Secondary | ICD-10-CM

## 2017-05-16 NOTE — Progress Notes (Signed)
PTNS  Session # 1  Health & Social Factors: same Caffeine: 3 Alcohol: 0 Daytime voids #per day: ? Night-time voids #per night: 0-2 Urgency: mild-strong Incontinence Episodes #per day: 0-1 Ankle used: right Treatment Setting: 16 Feeling/ Response: sensory Comments: none  Preformed By: Fonnie Jarvis, CMA    Follow Up: next week

## 2017-05-23 ENCOUNTER — Ambulatory Visit (INDEPENDENT_AMBULATORY_CARE_PROVIDER_SITE_OTHER): Payer: Medicare Other

## 2017-05-23 DIAGNOSIS — N3941 Urge incontinence: Secondary | ICD-10-CM | POA: Diagnosis not present

## 2017-05-23 NOTE — Progress Notes (Signed)
PTNS  Session # 2  Health & Social Factors: no change Caffeine: 2 Alcohol: 0 Daytime voids #per day: 10-12 Night-time voids #per night: 1-2 Urgency: mild Incontinence Episodes #per day: 3 Ankle used: left Treatment Setting: 19 Feeling/ Response: toe flex Comments:   Preformed By: Toniann Fail, LPN   Follow Up: 1 week

## 2017-05-29 ENCOUNTER — Ambulatory Visit (INDEPENDENT_AMBULATORY_CARE_PROVIDER_SITE_OTHER): Payer: Medicare Other

## 2017-05-29 DIAGNOSIS — R350 Frequency of micturition: Secondary | ICD-10-CM | POA: Diagnosis not present

## 2017-05-29 NOTE — Progress Notes (Signed)
PTNS  Session # 3  Health & Social Factors: no change Caffeine: 2 Alcohol: 0 Daytime voids #per day: 14 Night-time voids #per night: 1 Urgency: mild Incontinence Episodes #per day: 1 Ankle used: rught Treatment Setting: 5 Feeling/ Response: both Comments: n/a  Preformed By: Fonnie Jarvis, CMA  Follow Up: 1 week

## 2017-05-30 ENCOUNTER — Ambulatory Visit: Payer: Medicare Other | Admitting: Urology

## 2017-06-07 ENCOUNTER — Ambulatory Visit (INDEPENDENT_AMBULATORY_CARE_PROVIDER_SITE_OTHER): Payer: Medicare Other

## 2017-06-07 DIAGNOSIS — N3941 Urge incontinence: Secondary | ICD-10-CM

## 2017-06-07 NOTE — Progress Notes (Signed)
PTNS  Session # 4  Health & Social Factors: no change Caffeine: 2 Alcohol: 0 Daytime voids #per day: 14 Night-time voids #per night: 1 Urgency: mild Incontinence Episodes #per day: 1 Ankle used: right Treatment Setting: 19 Feeling/ Response: sensory Comments:   Preformed By: Toniann Fail, LPN

## 2017-06-11 ENCOUNTER — Other Ambulatory Visit: Payer: Medicare Other

## 2017-06-12 ENCOUNTER — Other Ambulatory Visit: Payer: Medicare Other

## 2017-06-12 ENCOUNTER — Ambulatory Visit (INDEPENDENT_AMBULATORY_CARE_PROVIDER_SITE_OTHER): Payer: Medicare Other | Admitting: Internal Medicine

## 2017-06-12 ENCOUNTER — Encounter: Payer: Self-pay | Admitting: Internal Medicine

## 2017-06-12 VITALS — BP 132/78 | HR 81 | Temp 97.9°F | Resp 18 | Wt 237.8 lb

## 2017-06-12 DIAGNOSIS — E7800 Pure hypercholesterolemia, unspecified: Secondary | ICD-10-CM | POA: Diagnosis not present

## 2017-06-12 DIAGNOSIS — Z7901 Long term (current) use of anticoagulants: Secondary | ICD-10-CM

## 2017-06-12 DIAGNOSIS — I10 Essential (primary) hypertension: Secondary | ICD-10-CM | POA: Diagnosis not present

## 2017-06-12 DIAGNOSIS — E119 Type 2 diabetes mellitus without complications: Secondary | ICD-10-CM | POA: Diagnosis not present

## 2017-06-12 DIAGNOSIS — E1142 Type 2 diabetes mellitus with diabetic polyneuropathy: Secondary | ICD-10-CM | POA: Diagnosis not present

## 2017-06-12 LAB — HEPATIC FUNCTION PANEL
ALK PHOS: 37 U/L — AB (ref 39–117)
ALT: 20 U/L (ref 0–35)
AST: 23 U/L (ref 0–37)
Albumin: 4 g/dL (ref 3.5–5.2)
BILIRUBIN DIRECT: 0.1 mg/dL (ref 0.0–0.3)
BILIRUBIN TOTAL: 0.6 mg/dL (ref 0.2–1.2)
Total Protein: 6.7 g/dL (ref 6.0–8.3)

## 2017-06-12 LAB — BASIC METABOLIC PANEL
BUN: 19 mg/dL (ref 6–23)
CALCIUM: 9.6 mg/dL (ref 8.4–10.5)
CO2: 26 meq/L (ref 19–32)
CREATININE: 0.84 mg/dL (ref 0.40–1.20)
Chloride: 104 mEq/L (ref 96–112)
GFR: 69.47 mL/min (ref >60.00)
GLUCOSE: 122 mg/dL — AB (ref 70–99)
Potassium: 4.2 mEq/L (ref 3.5–5.1)
Sodium: 136 mEq/L (ref 135–145)

## 2017-06-12 LAB — PROTIME-INR
INR: 1.6 ratio — ABNORMAL HIGH (ref 0.8–1.0)
PROTHROMBIN TIME: 17.4 s — AB (ref 9.6–13.1)

## 2017-06-12 LAB — HEMOGLOBIN A1C: Hgb A1c MFr Bld: 6 % (ref 4.6–6.5)

## 2017-06-12 NOTE — Progress Notes (Signed)
[name], female   DOB: 09/20/1940, 77 y.o.   MRN: [id number]   Subjective:    [name], female    DOB: 11-30-1940, 77 y.o.   MRN: [id number]  HPI  Patient here for a scheduled follow up.  She reports she is doing well.  Feels good.  Trying to stay active.  No chest pain.  No sob.  No acid reflux. No abdominal pain.  Bowels moving.  No urine change.  Overall she feels good and feels she is doing well.     Past Medical History:  Diagnosis Date  . Anemia   . Arthritis   . Atrial fibrillation ([place])   . Breast cancer ([place])    s/p lumpectomy 1992.  s/p chemo and xrt left breast  . Diabetes mellitus ([place])   . Edema    feet/legs  . Gastric ulcer   . GERD (gastroesophageal reflux disease)   . HOH (hard of hearing)    aides  . Hypercholesterolemia   . Hypertension   . Pulmonary emboli [facility])    Past Surgical History:  Procedure Laterality Date  . BREAST LUMPECTOMY  03/06/1990   left breast  . CATARACT EXTRACTION W/PHACO Right 12/26/2016   Procedure: CATARACT EXTRACTION PHACO AND INTRAOCULAR LENS PLACEMENT (IOC)-RIGHT DIABETIC;  Surgeon: Birder Robson, MD;  Location: [facility] ORS;  Service: Ophthalmology;  Laterality: Right;  [place] 00:47 AP% 24.5 CDE 11.62 Fluid pack lot # [id number]  . CATARACT EXTRACTION W/PHACO Left 01/23/2017   Procedure: CATARACT EXTRACTION PHACO AND INTRAOCULAR LENS PLACEMENT (IOC);  Surgeon: Birder Robson, MD;  Location: [facility] ORS;  Service: Ophthalmology;  Laterality: Left;  [place] 00:50 AP% 16.1 CDE 8.18 Fluid pack lot #[id number]   Family History  Problem Relation Age of Onset  . Heart disease Father   . Heart disease Brother        s/p CABG  . Colon cancer Neg Hx   . Breast cancer Neg Hx    Social History   Socioeconomic History  . Marital status: Married    Spouse name: Not on file  . Number of children: Not on file  . Years of education: Not on file  . Highest education level: Not on file  Occupational  History  . Not on file  Social Needs  . Financial resource strain: Not on file  . Food insecurity:    Worry: Not on file    Inability: Not on file  . Transportation needs:    Medical: Not on file    Non-medical: Not on file  Tobacco Use  . Smoking status: Never Smoker  . Smokeless tobacco: Never Used  Substance and Sexual Activity  . Alcohol use: No    Alcohol/week: 0.0 oz  . Drug use: No  . Sexual activity: Not on file  Lifestyle  . Physical activity:    Days per week: Not on file    Minutes per session: Not on file  . Stress: Not on file  Relationships  . Social connections:    Talks on phone: Not on file    Gets together: Not on file    Attends religious service: Not on file    Active member of club or organization: Not on file    Attends meetings of clubs or organizations: Not on file    Relationship status: Not on file  Other Topics Concern  . Not on file  Social History Narrative  . Not on file  Outpatient Encounter Medications as of 06/12/2017  Medication Sig  . Difluprednate (DUREZOL) 0.05 % EMUL Place 1 drop 2 (two) times daily into the right eye.  . diphenhydrAMINE (BENADRYL) 25 mg capsule Take 25 mg at bedtime as needed by mouth for sleep.   . ferrous sulfate 325 (65 FE) MG tablet TAKE ONE (1) TABLET BY MOUTH EVERY DAY (Patient taking differently: Take 325 mg daily with supper by mouth. )  . fesoterodine (TOVIAZ) 8 MG TB24 tablet Take 1 tablet (8 mg total) by mouth daily.  Marland Kitchen glucose blood (BAYER CONTOUR TEST) test strip Check one time day  Dx E11.9  . losartan (COZAAR) 100 MG tablet TAKE 1 TABLET BY MOUTH EVERY DAY  . Multiple Vitamin (MULTIVITAMIN WITH MINERALS) TABS tablet Take 1 tablet daily by mouth.  . pantoprazole (PROTONIX) 40 MG tablet Take 1 tablet (40 mg total) by mouth daily.  . simvastatin (ZOCOR) 10 MG tablet TAKE ONE (1) TABLET BY MOUTH EVERY DAY  . warfarin (COUMADIN) 2.5 MG tablet TAKE 2 TABLETS BY MOUTH ON TUESDAY, THURSDAY, SATURDAY, AND  SUNDAY. TAKE 1 TABLET ON ALL OTHER DAYS (Patient taking differently: Take 2.5-5 mg See admin instructions by mouth. Takes 2 tablets (5 mg) on Tuesdays and Thursdays and 1 tablet (2.5 mg)  all other days of the week)   No facility-administered encounter medications on file as of 06/12/2017.     Review of Systems  Constitutional: Negative for appetite change and unexpected weight change.  HENT: Negative for congestion and sinus pressure.   Respiratory: Negative for cough, chest tightness and shortness of breath.   Cardiovascular: Negative for chest pain, palpitations and leg swelling.  Gastrointestinal: Negative for abdominal pain, diarrhea, nausea and vomiting.  Genitourinary: Negative for difficulty urinating and dysuria.  Musculoskeletal: Negative for joint swelling and myalgias.  Skin: Negative for color change and rash.  Neurological: Negative for dizziness, light-headedness and headaches.  Psychiatric/Behavioral: Negative for agitation and dysphoric mood.       Objective:     Blood pressure rechecked by me:  134/78  Physical Exam  Constitutional: She appears well-developed and well-nourished. No distress.  HENT:  Nose: Nose normal.  Mouth/Throat: Oropharynx is clear and moist.  Neck: Neck supple. No thyromegaly present.  Cardiovascular: Normal rate and regular rhythm.  Pulmonary/Chest: Breath sounds normal. No respiratory distress. She has no wheezes.  Abdominal: Soft. Bowel sounds are normal. There is no tenderness.  Musculoskeletal: She exhibits no edema or tenderness.  Lymphadenopathy:    She has no cervical adenopathy.  Skin: No rash noted. No erythema.  Psychiatric: She has a normal mood and affect. Her behavior is normal.    BP 132/78 (BP Location: Right Arm, Patient Position: Sitting, Cuff Size: Normal)   Pulse 81   Temp 97.9 F (36.6 C) (Oral)   Resp 18   Wt 237 lb 12.8 oz (107.9 kg)   SpO2 96%   BMI 36.16 kg/m  Wt Readings from Last 3 Encounters:  06/12/17  237 lb 12.8 oz (107.9 kg)  05/09/17 243 lb (110.2 kg)  05/07/17 241 lb 6.4 oz (109.5 kg)     Lab Results  Component Value Date   WBC 7.9 11/07/2016   HGB 15.0 11/07/2016   HCT 45.0 11/07/2016   PLT 206.0 11/07/2016   GLUCOSE 122 (H) 06/12/2017   CHOL 131 09/15/2016   TRIG 104.0 09/15/2016   HDL 49.00 09/15/2016   LDLCALC 61 09/15/2016   ALT 20 06/12/2017   AST 23 06/12/2017  NA 136 06/12/2017   K 4.2 06/12/2017   CL 104 06/12/2017   CREATININE 0.84 06/12/2017   BUN 19 06/12/2017   CO2 26 06/12/2017   TSH 2.55 01/21/2016   INR 1.6 (H) 06/12/2017   HGBA1C 6.0 06/12/2017   MICROALBUR <0.7 01/21/2016    Ct Chest Wo Contrast  Result Date: 05/02/2017 CLINICAL DATA:  Follow-up lung nodule EXAM: CT CHEST WITHOUT CONTRAST TECHNIQUE: Multidetector CT imaging of the chest was performed following the standard protocol without IV contrast. COMPARISON:  CT chest 01/31/2009, 11/02/2016 FINDINGS: Cardiovascular: No significant vascular findings. Normal heart size. No pericardial effusion. Normal caliber thoracic aorta. Thoracic aortic atherosclerosis. Mild coronary artery atherosclerosis in the left main and LAD. Mediastinum/Nodes: No enlarged mediastinal or axillary lymph nodes. Thyroid gland, trachea, and esophagus demonstrate no significant findings. Lungs/Pleura: Stable 4 mm pulmonary nodule in the superior segment of the right lower lobe unchanged compared with 01/31/2009. No focal consolidation. No pleural effusion or pneumothorax. Upper Abdomen: No acute abnormality. Large hiatal hernia. 2.4 x 1.9 cm right hepatic hypodense, fluid attenuating mass most consistent with a cyst. Musculoskeletal: No chest wall mass or suspicious bone lesions identified. Anterior bridging osteophytes involving the thoracic spine as can be seen with diffuse idiopathic skeletal hyperostosis. IMPRESSION: 1. Stable 4 mm pulmonary nodule in the superior segment of the right lower lobe unchanged compared with  01/31/2009. No further evaluation recommended at this time. 2. No active cardiopulmonary disease. 3.  Aortic Atherosclerosis (ICD10-I70.0). 4. Large hiatal hernia. Electronically Signed   By: Kathreen Devoid   On: 05/02/2017 16:49       Assessment & Plan:   Problem List Items Addressed This Visit    Diabetic polyneuropathy associated with type 2 diabetes mellitus ([place])   Relevant Orders   Hemoglobin A1c (Completed)   Essential hypertension - Primary   Relevant Orders   Basic metabolic panel (Completed)   Hypercholesteremia    On simvastatin.  Low cholesterol diet and exercise.  Follow lipid panel and liver function tests.        Relevant Orders   Hepatic function panel (Completed)   Long term current use of anticoagulant therapy   Relevant Orders   Protime-INR (Completed)   Type 2 diabetes mellitus without complications (HCC)    Low carb diet and exercise.  Follow met b and a1c.            Einar Pheasant, MD

## 2017-06-13 ENCOUNTER — Ambulatory Visit (INDEPENDENT_AMBULATORY_CARE_PROVIDER_SITE_OTHER): Payer: Medicare Other

## 2017-06-13 DIAGNOSIS — N3941 Urge incontinence: Secondary | ICD-10-CM | POA: Diagnosis not present

## 2017-06-13 NOTE — Progress Notes (Signed)
PTNS  Session # 5  Health & Social Factors: no change Caffeine: 2 Alcohol: 0 Daytime voids #per day: 12-15 Night-time voids #per night: 1 Urgency: mild Incontinence Episodes #per day: 1 Ankle used: right Treatment Setting: 7 Feeling/ Response: both Comments: none  Preformed By: Fonnie Jarvis, CMA    Follow Up: 1 week

## 2017-06-16 ENCOUNTER — Encounter: Payer: Self-pay | Admitting: Internal Medicine

## 2017-06-16 NOTE — Assessment & Plan Note (Signed)
Low carb diet and exercise.  Follow met b and a1c.   

## 2017-06-16 NOTE — Assessment & Plan Note (Signed)
On simvastatin.  Low cholesterol diet and exercise.  Follow lipid panel and liver function tests.   

## 2017-06-16 NOTE — Assessment & Plan Note (Signed)
Blood pressure under good control.  Continue same medication regimen.  Follow pressures.  Follow metabolic panel.   

## 2017-06-20 ENCOUNTER — Ambulatory Visit: Payer: Medicare Other | Admitting: Family Medicine

## 2017-06-20 DIAGNOSIS — N3941 Urge incontinence: Secondary | ICD-10-CM

## 2017-06-20 NOTE — Progress Notes (Signed)
PTNS  Session # 6  Health & Social Factors: no change Caffeine: 2 Alcohol: 0 Daytime voids #per day: 12 Night-time voids #per night: 1 Urgency: none Incontinence Episodes #per day: 1 Ankle used: right Treatment Setting: 5 Feeling/ Response: both Comments: Patient tolerated well.  Preformed By: Elberta Leatherwood, CMA  Follow Up: 1 week # 7

## 2017-06-26 ENCOUNTER — Other Ambulatory Visit (INDEPENDENT_AMBULATORY_CARE_PROVIDER_SITE_OTHER): Payer: Medicare Other

## 2017-06-26 DIAGNOSIS — Z7901 Long term (current) use of anticoagulants: Secondary | ICD-10-CM | POA: Diagnosis not present

## 2017-06-26 LAB — PROTIME-INR
INR: 2 ratio — ABNORMAL HIGH (ref 0.8–1.0)
Prothrombin Time: 23.5 s — ABNORMAL HIGH (ref 9.6–13.1)

## 2017-06-27 ENCOUNTER — Ambulatory Visit: Payer: Medicare Other | Admitting: Family Medicine

## 2017-06-27 ENCOUNTER — Telehealth: Payer: Self-pay | Admitting: Internal Medicine

## 2017-06-27 DIAGNOSIS — N3941 Urge incontinence: Secondary | ICD-10-CM

## 2017-06-27 NOTE — Progress Notes (Signed)
PTNS  Session # 7  Health & Social Factors: no change Caffeine: 2 Alcohol: 0 Daytime voids #per day: 12 Night-time voids #per night: 2 Urgency: none Incontinence Episodes #per day: 0 Ankle used: right Treatment Setting: 19 Feeling/ Response: sensory Comments: Patient tolerated well.  Preformed By: Elberta Leatherwood, CMA  Follow Up: 1 week # 8

## 2017-06-27 NOTE — Telephone Encounter (Unsigned)
Copied from [place] [phone]. Topic: General - Other >> Jun 27, 2017  4:15 PM Carolyn Stare wrote:  Pt call for her lab results Triage line was busy   [phone]

## 2017-06-28 ENCOUNTER — Encounter: Payer: Self-pay | Admitting: Podiatry

## 2017-06-28 ENCOUNTER — Ambulatory Visit (INDEPENDENT_AMBULATORY_CARE_PROVIDER_SITE_OTHER): Payer: Medicare Other | Admitting: Podiatry

## 2017-06-28 DIAGNOSIS — M79609 Pain in unspecified limb: Secondary | ICD-10-CM

## 2017-06-28 DIAGNOSIS — E1142 Type 2 diabetes mellitus with diabetic polyneuropathy: Secondary | ICD-10-CM | POA: Diagnosis not present

## 2017-06-28 DIAGNOSIS — B351 Tinea unguium: Secondary | ICD-10-CM | POA: Diagnosis not present

## 2017-06-28 NOTE — Progress Notes (Signed)
Complaint:  Visit Type: Patient returns to my office for continued preventative foot care services. Complaint: Patient states" my nails have grown long and thick and become painful to walk and wear shoes" Patient has been diagnosed with DM with neuropathy. The patient presents for preventative foot care services. No changes to ROS.  Patient is taking coumadin.  Podiatric Exam: Vascular: dorsalis pedis  are palpable bilateral. Posterior tibial pulses are non palpable due to ankle/leg swelling. Capillary return is immediate. Temperature gradient is WNL. Skin turgor WNL  Sensorium: Diminished  Semmes Weinstein monofilament test. Normal tactile sensation bilaterally. Nail Exam: Pt has thick disfigured discolored nails with subungual debris noted bilateral entire nail hallux through fifth toenails.  Pincer hallux nails. Ulcer Exam: There is no evidence of ulcer or pre-ulcerative changes or infection. Orthopedic Exam: Muscle tone and strength are WNL. No limitations in general ROM. No crepitus or effusions noted. Foot type and digits show no abnormalities. Bony prominences are unremarkable. Skin: No Porokeratosis. No infection or ulcers  Diagnosis:  Onychomycosis, , Pain in right toe, pain in left toes  Treatment & Plan Procedures and Treatment: Consent by patient was obtained for treatment procedures. The patient understood the discussion of treatment and procedures well. All questions were answered thoroughly reviewed. Debridement of mycotic and hypertrophic toenails, 1 through 5 bilateral and clearing of subungual debris. No ulceration, no infection noted. Signed ABN for 2019. Return Visit-Office Procedure: Patient instructed to return to the office for a follow up visit 3 months for continued evaluation and treatment.    Hutson Luft DPM 

## 2017-06-28 NOTE — Telephone Encounter (Signed)
Left message for pt to call back for results. Documented in result note.

## 2017-07-04 ENCOUNTER — Ambulatory Visit (INDEPENDENT_AMBULATORY_CARE_PROVIDER_SITE_OTHER): Payer: Medicare Other

## 2017-07-04 ENCOUNTER — Other Ambulatory Visit: Payer: Self-pay | Admitting: Internal Medicine

## 2017-07-04 DIAGNOSIS — N3941 Urge incontinence: Secondary | ICD-10-CM | POA: Diagnosis not present

## 2017-07-04 NOTE — Progress Notes (Signed)
PTNS  Session # 8  Health & Social Factors: no change Caffeine: 2 Alcohol: 0 Daytime voids #per day: 10 Night-time voids #per night: 1 Urgency: none Incontinence Episodes #per day: 0 Ankle used: right Treatment Setting: 13 Feeling/ Response: sensory Comments: none  Preformed By: Fonnie Jarvis, CMA  Follow Up: 1 wk

## 2017-07-05 ENCOUNTER — Other Ambulatory Visit: Payer: Self-pay

## 2017-07-05 MED ORDER — WARFARIN SODIUM 2.5 MG PO TABS: ORAL_TABLET | ORAL | 0 refills | Status: DC

## 2017-07-11 ENCOUNTER — Other Ambulatory Visit: Payer: Self-pay | Admitting: Radiology

## 2017-07-11 ENCOUNTER — Ambulatory Visit (INDEPENDENT_AMBULATORY_CARE_PROVIDER_SITE_OTHER): Payer: Medicare Other | Admitting: Family Medicine

## 2017-07-11 DIAGNOSIS — N3941 Urge incontinence: Secondary | ICD-10-CM | POA: Diagnosis not present

## 2017-07-11 DIAGNOSIS — Z5181 Encounter for therapeutic drug level monitoring: Secondary | ICD-10-CM

## 2017-07-11 DIAGNOSIS — Z7901 Long term (current) use of anticoagulants: Principal | ICD-10-CM

## 2017-07-11 NOTE — Progress Notes (Signed)
PTNS  Session # 9  Health & Social Factors: no change Caffeine: 2 Alcohol: 0 Daytime voids #per day: 10 Night-time voids #per night: 2 Urgency: none Incontinence Episodes #per day: 0 Ankle used: right Treatment Setting: 10 Feeling/ Response: sensory Comments: patient tolerated well.  Preformed By: Elberta Leatherwood, CMA  Follow Up: 1 week #10

## 2017-07-12 ENCOUNTER — Other Ambulatory Visit (INDEPENDENT_AMBULATORY_CARE_PROVIDER_SITE_OTHER): Payer: Medicare Other

## 2017-07-12 DIAGNOSIS — Z7901 Long term (current) use of anticoagulants: Secondary | ICD-10-CM | POA: Diagnosis not present

## 2017-07-12 DIAGNOSIS — Z5181 Encounter for therapeutic drug level monitoring: Secondary | ICD-10-CM | POA: Diagnosis not present

## 2017-07-12 LAB — PROTIME-INR
INR: 2.2 ratio — ABNORMAL HIGH (ref 0.8–1.0)
Prothrombin Time: 24.8 s — ABNORMAL HIGH (ref 9.6–13.1)

## 2017-07-18 ENCOUNTER — Ambulatory Visit: Payer: Medicare Other | Admitting: Family Medicine

## 2017-07-18 DIAGNOSIS — N3941 Urge incontinence: Secondary | ICD-10-CM

## 2017-07-18 NOTE — Progress Notes (Signed)
PTNS  Session # 10  Health & Social Factors: no charge  Caffeine: 2 Alcohol: 0 Daytime voids #per day: 10 Night-time voids #per night: 2 Urgency: none Incontinence Episodes #per day: 0 Ankle used: right Treatment Setting: 16 Feeling/ Response: sensory Comments: Patient tolerated well.  Preformed By: Elberta Leatherwood, CMA  Follow Up: 1 week #11

## 2017-07-25 ENCOUNTER — Ambulatory Visit: Payer: Medicare Other | Admitting: Family Medicine

## 2017-07-25 DIAGNOSIS — N3941 Urge incontinence: Secondary | ICD-10-CM

## 2017-07-25 NOTE — Progress Notes (Signed)
PTNS  Session # 11  Health & Social Factors: no change Caffeine: 2 Alcohol: 0 Daytime voids #per day: 10 Night-time voids #per night: 2 Urgency: none Incontinence Episodes #per day: 2 Ankle used: Right Treatment Setting: 19 Feeling/ Response: Toe flex Comments: Patient tolerated well  Preformed By: Elberta Leatherwood, CMA  Follow Up: 1 week # 12

## 2017-08-01 ENCOUNTER — Ambulatory Visit (INDEPENDENT_AMBULATORY_CARE_PROVIDER_SITE_OTHER): Payer: Medicare Other | Admitting: Family Medicine

## 2017-08-01 DIAGNOSIS — N3941 Urge incontinence: Secondary | ICD-10-CM | POA: Diagnosis not present

## 2017-08-01 NOTE — Progress Notes (Signed)
PTNS  Session # 12  Health & Social Factors: no change Caffeine: 2 Alcohol: 0 Daytime voids #per day: 10 Night-time voids #per night: 1 Urgency: none Incontinence Episodes #per day: 1 Ankle used: right Treatment Setting: 19 Feeling/ Response: both Comments: patient tolerated well.  Preformed By: Elberta Leatherwood, [place]

## 2017-08-02 ENCOUNTER — Other Ambulatory Visit: Payer: Self-pay | Admitting: Radiology

## 2017-08-02 DIAGNOSIS — Z5181 Encounter for therapeutic drug level monitoring: Secondary | ICD-10-CM

## 2017-08-02 DIAGNOSIS — Z7901 Long term (current) use of anticoagulants: Principal | ICD-10-CM

## 2017-08-03 ENCOUNTER — Other Ambulatory Visit (INDEPENDENT_AMBULATORY_CARE_PROVIDER_SITE_OTHER): Payer: Medicare Other

## 2017-08-03 DIAGNOSIS — Z5181 Encounter for therapeutic drug level monitoring: Secondary | ICD-10-CM

## 2017-08-03 DIAGNOSIS — Z7901 Long term (current) use of anticoagulants: Secondary | ICD-10-CM

## 2017-08-03 LAB — PROTIME-INR
INR: 2.2 ratio — AB (ref 0.8–1.0)
Prothrombin Time: 25.5 s — ABNORMAL HIGH (ref 9.6–13.1)

## 2017-08-08 ENCOUNTER — Ambulatory Visit: Payer: Medicare Other | Admitting: Urology

## 2017-08-08 DIAGNOSIS — E119 Type 2 diabetes mellitus without complications: Secondary | ICD-10-CM | POA: Diagnosis not present

## 2017-08-08 DIAGNOSIS — Z880 Allergy status to penicillin: Secondary | ICD-10-CM | POA: Diagnosis not present

## 2017-08-08 DIAGNOSIS — M160 Bilateral primary osteoarthritis of hip: Secondary | ICD-10-CM | POA: Diagnosis not present

## 2017-08-08 DIAGNOSIS — M1612 Unilateral primary osteoarthritis, left hip: Secondary | ICD-10-CM | POA: Diagnosis not present

## 2017-08-08 DIAGNOSIS — K219 Gastro-esophageal reflux disease without esophagitis: Secondary | ICD-10-CM | POA: Diagnosis not present

## 2017-08-08 DIAGNOSIS — M79605 Pain in left leg: Secondary | ICD-10-CM | POA: Diagnosis not present

## 2017-08-08 DIAGNOSIS — Z79899 Other long term (current) drug therapy: Secondary | ICD-10-CM | POA: Diagnosis not present

## 2017-08-08 DIAGNOSIS — Z7901 Long term (current) use of anticoagulants: Secondary | ICD-10-CM | POA: Diagnosis not present

## 2017-08-08 DIAGNOSIS — E785 Hyperlipidemia, unspecified: Secondary | ICD-10-CM | POA: Diagnosis not present

## 2017-08-08 DIAGNOSIS — I10 Essential (primary) hypertension: Secondary | ICD-10-CM | POA: Diagnosis not present

## 2017-08-08 DIAGNOSIS — M545 Low back pain: Secondary | ICD-10-CM | POA: Diagnosis not present

## 2017-08-08 DIAGNOSIS — Z6835 Body mass index (BMI) 35.0-35.9, adult: Secondary | ICD-10-CM | POA: Diagnosis not present

## 2017-08-19 NOTE — Progress Notes (Signed)
08/22/2017 2:28 PM   Anita Walker [birth year] [id number]  Referring provider: Einar Pheasant, [address] [place], [place] [address]  No chief complaint on file.   HPI: Patient is a 77 year old Caucasian female with urinary incontinence, nocturia and a cystocele who presents today for follow-up after her 12 weekly treatments of PTNS.  Background history I was consulted to assess the patient's urgency incontinence which primarily occurs with foot on the floor syndrome in the middle of the night.  Less frequently during the day she can have urgency incontinence and denies stress incontinence.  At night she wears 1 pad that is moderately wet.  She voids every 2 hours during the day but sometimes every 15-30 minutes after fluids.  She gets up 3 times at night.  pelvic examination was limited by her immobility.  She had a grade 2 cystocele that almost reached the introitus.  She had no stress incontinence.  The patient primarily has urgency incontinence with mild frequency and nocturia.  She likely has a component of a nocturnal diuresis.  If overactive bladder medications did not reach her treatment goal desmopressin would also be an option.  I may need to cystoscope her in the future but she had no blood in the urine today and I sent it for culture.  I will reassess the patient in 4-6 weeks on the Myrbetriq 50 mg samples and prescription.    The patient has failed Toviaz oxybutynin and Myrbetriq.  She has completed her 12 weekly treatments of PTNS and she has found benefit with the treatments and would like to continue.    Today, she has been experiencing urgency x 0-3, frequency x 8 or more, not restricting fluids to avoid visits to the restroom, not engaging in toilet mapping, incontinence x 0-3 and nocturia x 0-3.  Her PVR is 42 mL.  Patient denies any gross hematuria, dysuria or suprapubic/flank pain.  Patient denies any fevers, chills, nausea or vomiting.    PMH: Past Medical History:  Diagnosis Date  . Anemia   . Arthritis   . Atrial fibrillation ([place])   . Breast cancer ([place])    s/p lumpectomy 1992.  s/p chemo and xrt left breast  . Diabetes mellitus ([place])   . Edema    feet/legs  . Gastric ulcer   . GERD (gastroesophageal reflux disease)   . HOH (hard of hearing)    aides  . Hypercholesterolemia   . Hypertension   . Pulmonary emboli [facility])     Surgical History: Past Surgical History:  Procedure Laterality Date  . BREAST LUMPECTOMY  03/06/1990   left breast  . CATARACT EXTRACTION W/PHACO Right 12/26/2016   Procedure: CATARACT EXTRACTION PHACO AND INTRAOCULAR LENS PLACEMENT (IOC)-RIGHT DIABETIC;  Surgeon: Birder Robson, MD;  Location: [facility] ORS;  Service: Ophthalmology;  Laterality: Right;  [place] 00:47 AP% 24.5 CDE 11.62 Fluid pack lot # [id number]  . CATARACT EXTRACTION W/PHACO Left 01/23/2017   Procedure: CATARACT EXTRACTION PHACO AND INTRAOCULAR LENS PLACEMENT (IOC);  Surgeon: Birder Robson, MD;  Location: [facility] ORS;  Service: Ophthalmology;  Laterality: Left;  [place] 00:50 AP% 16.1 CDE 8.18 Fluid pack lot #[id number]    Home Medications:  Allergies as of 08/22/2017      Reactions   Penicillins Other (See Comments)   Unknown- pt states been a long time ago  Has patient had a PCN reaction causing immediate rash, facial/tongue/throat swelling, SOB or lightheadedness with hypotension: Unknown Has patient had a PCN reaction causing  severe rash involving mucus membranes or skin necrosis: Unknown Has patient had a PCN reaction that required hospitalization: Unknown Has patient had a PCN reaction occurring within the last 10 years: Unknown If all of the above answers are "NO", then may proceed with Cephalosporin use.   Penicillin V Potassium Nausea And Vomiting      Medication List        Accurate as of 08/22/17 11:59 PM. Always use your most recent med list.          diphenhydrAMINE 25 mg capsule Commonly known as:   BENADRYL Take 25 mg at bedtime as needed by mouth for sleep.   DUREZOL 0.05 % Emul Generic drug:  Difluprednate Place 1 drop 2 (two) times daily into the right eye.   ferrous sulfate 325 (65 FE) MG tablet TAKE ONE (1) TABLET BY MOUTH EVERY DAY   fesoterodine 8 MG Tb24 tablet Commonly known as:  TOVIAZ Take 1 tablet (8 mg total) by mouth daily.   glucose blood test strip Commonly known as:  BAYER CONTOUR TEST Check one time day  Dx E11.9   losartan 100 MG tablet Commonly known as:  COZAAR TAKE 1 TABLET BY MOUTH EVERY DAY   multivitamin with minerals Tabs tablet Take 1 tablet daily by mouth.   pantoprazole 40 MG tablet Commonly known as:  PROTONIX Take 1 tablet (40 mg total) by mouth daily.   simvastatin 10 MG tablet Commonly known as:  ZOCOR TAKE ONE (1) TABLET BY MOUTH EVERY DAY   warfarin 2.5 MG tablet Commonly known as:  COUMADIN Takes 2 tablets (5 mg) on Tuesdays and Thursdays and 1 tablet (2.5 mg)  all other days of the week       Allergies:  Allergies  Allergen Reactions  . Penicillins Other (See Comments)    Unknown- pt states been a long time ago  Has patient had a PCN reaction causing immediate rash, facial/tongue/throat swelling, SOB or lightheadedness with hypotension: Unknown Has patient had a PCN reaction causing severe rash involving mucus membranes or skin necrosis: Unknown Has patient had a PCN reaction that required hospitalization: Unknown Has patient had a PCN reaction occurring within the last 10 years: Unknown If all of the above answers are "NO", then may proceed with Cephalosporin use.  Marland Kitchen Penicillin V Potassium Nausea And Vomiting    Family History: Family History  Problem Relation Age of Onset  . Heart disease Father   . Heart disease Brother        s/p CABG  . Colon cancer Neg Hx   . Breast cancer Neg Hx     Social History:  reports that she has never smoked. She has never used smokeless tobacco. She reports that she does not  drink alcohol or use drugs.  ROS: UROLOGY Frequent Urination?: Yes Hard to postpone urination?: Yes Burning/pain with urination?: No Get up at night to urinate?: Yes Leakage of urine?: Yes Urine stream starts and stops?: No Trouble starting stream?: No Do you have to strain to urinate?: No Blood in urine?: No Urinary tract infection?: No Sexually transmitted disease?: No Injury to kidneys or bladder?: No Painful intercourse?: No Weak stream?: No Currently pregnant?: No Vaginal bleeding?: No Last menstrual period?: n  Gastrointestinal Nausea?: No Vomiting?: No Indigestion/heartburn?: No Diarrhea?: No Constipation?: No  Constitutional Fever: No Night sweats?: No Weight loss?: No Fatigue?: No  Skin Skin rash/lesions?: No Itching?: No  Eyes Blurred vision?: No Double vision?: No  Ears/Nose/Throat Sore throat?: No Sinus  problems?: No  Hematologic/Lymphatic Swollen glands?: No Easy bruising?: No  Cardiovascular Leg swelling?: Yes Chest pain?: No  Respiratory Cough?: No Shortness of breath?: No  Endocrine Excessive thirst?: No  Musculoskeletal Back pain?: No Joint pain?: No  Neurological Headaches?: No Dizziness?: No  Psychologic Depression?: No Anxiety?: No  Physical Exam: BP 128/81 (BP Location: Right Arm, Patient Position: Sitting, Cuff Size: Normal)   Pulse 77   Ht 5\' 8"  (1.727 m)   Wt 242 lb 1.6 oz (109.8 kg)   BMI 36.81 kg/m   Constitutional: Well nourished. Alert and oriented, No acute distress. HEENT: [place] AT, moist mucus membranes. Trachea midline, no masses. Cardiovascular: No clubbing, cyanosis, or edema. Respiratory: Normal respiratory effort, no increased work of breathing. Skin: No rashes, bruises or suspicious lesions. Lymph: No cervical or inguinal adenopathy. Neurologic: Grossly intact, no focal deficits, moving all 4 extremities. Psychiatric: Normal mood and affect.  Laboratory Data: Lab Results  Component Value  Date   WBC 7.9 11/07/2016   HGB 15.0 11/07/2016   HCT 45.0 11/07/2016   MCV 91.4 11/07/2016   PLT 206.0 11/07/2016    Lab Results  Component Value Date   CREATININE 0.84 06/12/2017    No results found for: PSA  No results found for: TESTOSTERONE  Lab Results  Component Value Date   HGBA1C 6.0 06/12/2017    Urinalysis    Component Value Date/Time   COLORURINE YELLOW (A) 11/02/2016 2216   APPEARANCEUR Clear 01/29/2017 1403   LABSPEC 1.029 11/02/2016 2216   LABSPEC 1.008 07/16/2013 1405   PHURINE 6.0 11/02/2016 2216   GLUCOSEU Negative 01/29/2017 1403   GLUCOSEU Negative 07/16/2013 1405   HGBUR NEGATIVE 11/02/2016 2216   BILIRUBINUR Negative 01/29/2017 1403   BILIRUBINUR Negative 07/16/2013 1405   KETONESUR 5 (A) 11/02/2016 2216   PROTEINUR Negative 01/29/2017 1403   PROTEINUR NEGATIVE 11/02/2016 2216   UROBILINOGEN 0.2 07/18/2013 0948   NITRITE Negative 01/29/2017 1403   NITRITE NEGATIVE 11/02/2016 2216   LEUKOCYTESUR 1+ (A) 01/29/2017 1403   LEUKOCYTESUR 3+ 07/16/2013 1405    Pertinent Imaging: Results for Walker, Anita ANN ROWLAND (MRN [id number]) as of 08/24/2017 14:21  Ref. Range 08/22/2017 15:37  Scan Result Unknown 42     Assessment & Plan:    1. Urge Incontinence Failed anticholinergics and Myrbetriq She is finding the PTNS effective and would like to continue the treatments RTC as scheduled for next PTNS - she is moving to monthly maintenance    2. Nocturia I explained to the patient that nocturia is often multi-factorial and difficult to treat.  Sleeping disorders, heart conditions, peripheral vascular disease, diabetes, an enlarged prostate for men, an urethral stricture causing bladder outlet obstruction and/or certain medications can contribute to nocturia. I have suggested that the patient avoid caffeine after noon and alcohol in the evening.  He or she may also benefit from fluid restrictions after 6:00 in the evening and voiding just prior to  bedtime. I have explained that research studies have showed that over 84% of patients with sleep apnea reported frequent nighttime urination.   With sleep apnea, oxygen decreases, carbon dioxide increases, the blood become more acidic, the heart rate drops and blood vessels in the lung constrict.  The body is then alerted that something is very wrong. The sleeper must wake enough to reopen the airway. By this time, the heart is racing and experiences a false signal of fluid overload. The heart excretes a hormone-like protein that tells the body to get rid of  sodium and water, resulting in nocturia. The patient may benefit from a discussion with his or her primary care physician to see if he or she has risk factors for sleep apnea or other sleep disturbances and obtaining a sleep study.  Return for as scheduled for PTNS .  These notes generated with voice recognition software. I apologize for typographical errors.   Zara Council, PA-C  [facility] [address] [place], [place] [address] [phone]

## 2017-08-20 DIAGNOSIS — E119 Type 2 diabetes mellitus without complications: Secondary | ICD-10-CM | POA: Diagnosis not present

## 2017-08-22 ENCOUNTER — Encounter: Payer: Self-pay | Admitting: Urology

## 2017-08-22 ENCOUNTER — Ambulatory Visit (INDEPENDENT_AMBULATORY_CARE_PROVIDER_SITE_OTHER): Payer: Medicare Other | Admitting: Urology

## 2017-08-22 VITALS — BP 128/81 | HR 77 | Ht 68.0 in | Wt 242.1 lb

## 2017-08-22 DIAGNOSIS — N3941 Urge incontinence: Secondary | ICD-10-CM

## 2017-08-22 DIAGNOSIS — R351 Nocturia: Secondary | ICD-10-CM | POA: Diagnosis not present

## 2017-08-22 LAB — BLADDER SCAN AMB NON-IMAGING: SCAN RESULT: 42

## 2017-08-29 ENCOUNTER — Other Ambulatory Visit: Payer: Self-pay | Admitting: Radiology

## 2017-08-29 ENCOUNTER — Ambulatory Visit (INDEPENDENT_AMBULATORY_CARE_PROVIDER_SITE_OTHER): Payer: Medicare Other

## 2017-08-29 DIAGNOSIS — N3941 Urge incontinence: Secondary | ICD-10-CM | POA: Diagnosis not present

## 2017-08-29 DIAGNOSIS — Z7901 Long term (current) use of anticoagulants: Principal | ICD-10-CM

## 2017-08-29 DIAGNOSIS — Z5181 Encounter for therapeutic drug level monitoring: Secondary | ICD-10-CM

## 2017-08-29 NOTE — Progress Notes (Signed)
PTNS  Session # monthly  Health & Social Factors: same Caffeine: 2 Alcohol: 0 Daytime voids #per day: ?? Night-time voids #per night: 1-2 Urgency: none Incontinence Episodes #per day: 0-1 Ankle used: right Treatment Setting: 19 Feeling/ Response: sensory Comments: none  Preformed By: Fonnie Jarvis, CMA   Follow Up: month

## 2017-09-03 ENCOUNTER — Other Ambulatory Visit (INDEPENDENT_AMBULATORY_CARE_PROVIDER_SITE_OTHER): Payer: Medicare Other

## 2017-09-03 DIAGNOSIS — Z5181 Encounter for therapeutic drug level monitoring: Secondary | ICD-10-CM

## 2017-09-03 DIAGNOSIS — Z7901 Long term (current) use of anticoagulants: Secondary | ICD-10-CM | POA: Diagnosis not present

## 2017-09-03 LAB — PROTIME-INR
INR: 2.6 ratio — ABNORMAL HIGH (ref 0.8–1.0)
Prothrombin Time: 29.9 s — ABNORMAL HIGH (ref 9.6–13.1)

## 2017-09-12 DIAGNOSIS — H26492 Other secondary cataract, left eye: Secondary | ICD-10-CM | POA: Diagnosis not present

## 2017-09-12 LAB — HM DIABETES EYE EXAM

## 2017-09-14 ENCOUNTER — Other Ambulatory Visit: Payer: Self-pay

## 2017-09-14 ENCOUNTER — Ambulatory Visit (INDEPENDENT_AMBULATORY_CARE_PROVIDER_SITE_OTHER): Payer: Medicare Other | Admitting: Internal Medicine

## 2017-09-14 ENCOUNTER — Encounter: Payer: Self-pay | Admitting: Internal Medicine

## 2017-09-14 VITALS — BP 128/72 | HR 85 | Ht 66.75 in | Wt 242.2 lb

## 2017-09-14 DIAGNOSIS — R350 Frequency of micturition: Secondary | ICD-10-CM

## 2017-09-14 DIAGNOSIS — Z0000 Encounter for general adult medical examination without abnormal findings: Secondary | ICD-10-CM

## 2017-09-14 DIAGNOSIS — Z7901 Long term (current) use of anticoagulants: Secondary | ICD-10-CM

## 2017-09-14 DIAGNOSIS — Z853 Personal history of malignant neoplasm of breast: Secondary | ICD-10-CM

## 2017-09-14 DIAGNOSIS — R9389 Abnormal findings on diagnostic imaging of other specified body structures: Secondary | ICD-10-CM | POA: Diagnosis not present

## 2017-09-14 DIAGNOSIS — I10 Essential (primary) hypertension: Secondary | ICD-10-CM

## 2017-09-14 DIAGNOSIS — E119 Type 2 diabetes mellitus without complications: Secondary | ICD-10-CM

## 2017-09-14 DIAGNOSIS — E7800 Pure hypercholesterolemia, unspecified: Secondary | ICD-10-CM | POA: Diagnosis not present

## 2017-09-14 LAB — HM DIABETES FOOT EXAM

## 2017-09-14 MED ORDER — WARFARIN SODIUM 2.5 MG PO TABS: ORAL_TABLET | ORAL | 0 refills | Status: DC

## 2017-09-14 NOTE — Progress Notes (Signed)
[name], female   DOB: 1940-07-21, 77 y.o.   MRN: [id number]   Subjective:    [name], female    DOB: 1941/03/01, 77 y.o.   MRN: [id number]  HPI  Patient here for her physical exam.  She reports she is doing relatively well.  Trying to stay active.  No chest pain.  No sob.  No acid reflux.  No abdominal pain.  Bowels moving.  No urine change.  Was having back pain.  Evaluated and referred to spine center.  Back is better. She plans to keep her appt.  Seeing urology for her urinary issues.   Discussed the need for shingrix.    Past Medical History:  Diagnosis Date  . Anemia   . Arthritis   . Atrial fibrillation ([place])   . Breast cancer ([place])    s/p lumpectomy 1992.  s/p chemo and xrt left breast  . Diabetes mellitus ([place])   . Edema    feet/legs  . Gastric ulcer   . GERD (gastroesophageal reflux disease)   . HOH (hard of hearing)    aides  . Hypercholesterolemia   . Hypertension   . Pulmonary emboli [facility])    Past Surgical History:  Procedure Laterality Date  . BREAST LUMPECTOMY  03/06/1990   left breast  . CATARACT EXTRACTION W/PHACO Right 12/26/2016   Procedure: CATARACT EXTRACTION PHACO AND INTRAOCULAR LENS PLACEMENT (IOC)-RIGHT DIABETIC;  Surgeon: Birder Robson, MD;  Location: [facility] ORS;  Service: Ophthalmology;  Laterality: Right;  [place] 00:47 AP% 24.5 CDE 11.62 Fluid pack lot # [id number]  . CATARACT EXTRACTION W/PHACO Left 01/23/2017   Procedure: CATARACT EXTRACTION PHACO AND INTRAOCULAR LENS PLACEMENT (IOC);  Surgeon: Birder Robson, MD;  Location: [facility] ORS;  Service: Ophthalmology;  Laterality: Left;  [place] 00:50 AP% 16.1 CDE 8.18 Fluid pack lot #[id number] H   Family History  Problem Relation Age of Onset  . Heart disease Father   . Heart disease Brother        s/p CABG  . Colon cancer Neg Hx   . Breast cancer Neg Hx    Social History   Socioeconomic History  . Marital status: Married    Spouse name: Not on file    . Number of children: Not on file  . Years of education: Not on file  . Highest education level: Not on file  Occupational History  . Not on file  Social Needs  . Financial resource strain: Not on file  . Food insecurity:    Worry: Not on file    Inability: Not on file  . Transportation needs:    Medical: Not on file    Non-medical: Not on file  Tobacco Use  . Smoking status: Never Smoker  . Smokeless tobacco: Never Used  Substance and Sexual Activity  . Alcohol use: No    Alcohol/week: 0.0 oz  . Drug use: No  . Sexual activity: Not on file  Lifestyle  . Physical activity:    Days per week: Not on file    Minutes per session: Not on file  . Stress: Not on file  Relationships  . Social connections:    Talks on phone: Not on file    Gets together: Not on file    Attends religious service: Not on file    Active member of club or organization: Not on file    Attends meetings of clubs or organizations: Not on file    Relationship status:  Not on file  Other Topics Concern  . Not on file  Social History Narrative  . Not on file    Outpatient Encounter Medications as of 09/14/2017  Medication Sig  . Difluprednate (DUREZOL) 0.05 % EMUL Place 1 drop 2 (two) times daily into the right eye.  . diphenhydrAMINE (BENADRYL) 25 mg capsule Take 25 mg at bedtime as needed by mouth for sleep.   . ferrous sulfate 325 (65 FE) MG tablet TAKE ONE (1) TABLET BY MOUTH EVERY DAY (Patient taking differently: Take 325 mg daily with supper by mouth. )  . fesoterodine (TOVIAZ) 8 MG TB24 tablet Take 1 tablet (8 mg total) by mouth daily.  Marland Kitchen glucose blood (BAYER CONTOUR TEST) test strip Check one time day  Dx E11.9  . losartan (COZAAR) 100 MG tablet TAKE 1 TABLET BY MOUTH EVERY DAY  . Multiple Vitamin (MULTIVITAMIN WITH MINERALS) TABS tablet Take 1 tablet daily by mouth.  . pantoprazole (PROTONIX) 40 MG tablet Take 1 tablet (40 mg total) by mouth daily.  . simvastatin (ZOCOR) 10 MG tablet TAKE ONE  (1) TABLET BY MOUTH EVERY DAY  . [DISCONTINUED] warfarin (COUMADIN) 2.5 MG tablet Takes 2 tablets (5 mg) on Tuesdays and Thursdays and 1 tablet (2.5 mg)  all other days of the week   No facility-administered encounter medications on file as of 09/14/2017.     Review of Systems  Constitutional: Negative for appetite change and unexpected weight change.  HENT: Negative for congestion and sinus pressure.   Respiratory: Negative for cough, chest tightness and shortness of breath.   Cardiovascular: Negative for chest pain, palpitations and leg swelling.  Gastrointestinal: Negative for abdominal pain, diarrhea, nausea and vomiting.  Genitourinary: Negative for difficulty urinating and dysuria.  Musculoskeletal: Negative for joint swelling and myalgias.  Skin: Negative for color change and rash.  Neurological: Negative for dizziness, light-headedness and headaches.  Psychiatric/Behavioral: Negative for agitation and dysphoric mood.       Objective:     Blood pressure rechecked by me:  128/72  Physical Exam  Constitutional: She appears well-developed and well-nourished. No distress.  HENT:  Nose: Nose normal.  Mouth/Throat: Oropharynx is clear and moist.  Eyes: Conjunctivae are normal. Right eye exhibits no discharge. Left eye exhibits no discharge.  Neck: Neck supple. No thyromegaly present.  Cardiovascular: Normal rate and regular rhythm.  Pulmonary/Chest: Breath sounds normal. No respiratory distress. She has no wheezes.  Breasts:  S/p left lumpectomy.  No nipple discharge or nipple retraction present.  Could not appreciate any distinct nodules or axillary adenopathy.    Abdominal: Soft. Bowel sounds are normal. There is no tenderness.  Musculoskeletal: She exhibits no edema or tenderness.  Feet:  No lesions.  Intact to light touch and pin prick.  DP pulses palpable and equal bilaterally.    Lymphadenopathy:    She has no cervical adenopathy.  Skin: No rash noted. No erythema.    Psychiatric: She has a normal mood and affect. Her behavior is normal.    BP 128/72   Pulse 85   Ht 5' 6.75" (1.695 m)   Wt 242 lb 3.2 oz (109.9 kg)   SpO2 94%   BMI 38.22 kg/m  Wt Readings from Last 3 Encounters:  09/14/17 242 lb 3.2 oz (109.9 kg)  08/22/17 242 lb 1.6 oz (109.8 kg)  06/12/17 237 lb 12.8 oz (107.9 kg)     Lab Results  Component Value Date   WBC 7.9 11/07/2016   HGB 15.0 11/07/2016  HCT 45.0 11/07/2016   PLT 206.0 11/07/2016   GLUCOSE 122 (H) 06/12/2017   CHOL 131 09/15/2016   TRIG 104.0 09/15/2016   HDL 49.00 09/15/2016   LDLCALC 61 09/15/2016   ALT 20 06/12/2017   AST 23 06/12/2017   NA 136 06/12/2017   K 4.2 06/12/2017   CL 104 06/12/2017   CREATININE 0.84 06/12/2017   BUN 19 06/12/2017   CO2 26 06/12/2017   TSH 2.55 01/21/2016   INR 2.2 (H) 09/14/2017   HGBA1C 6.0 06/12/2017   MICROALBUR <0.7 01/21/2016    Ct Chest Wo Contrast  Result Date: 05/02/2017 CLINICAL DATA:  Follow-up lung nodule EXAM: CT CHEST WITHOUT CONTRAST TECHNIQUE: Multidetector CT imaging of the chest was performed following the standard protocol without IV contrast. COMPARISON:  CT chest 01/31/2009, 11/02/2016 FINDINGS: Cardiovascular: No significant vascular findings. Normal heart size. No pericardial effusion. Normal caliber thoracic aorta. Thoracic aortic atherosclerosis. Mild coronary artery atherosclerosis in the left main and LAD. Mediastinum/Nodes: No enlarged mediastinal or axillary lymph nodes. Thyroid gland, trachea, and esophagus demonstrate no significant findings. Lungs/Pleura: Stable 4 mm pulmonary nodule in the superior segment of the right lower lobe unchanged compared with 01/31/2009. No focal consolidation. No pleural effusion or pneumothorax. Upper Abdomen: No acute abnormality. Large hiatal hernia. 2.4 x 1.9 cm right hepatic hypodense, fluid attenuating mass most consistent with a cyst. Musculoskeletal: No chest wall mass or suspicious bone lesions identified.  Anterior bridging osteophytes involving the thoracic spine as can be seen with diffuse idiopathic skeletal hyperostosis. IMPRESSION: 1. Stable 4 mm pulmonary nodule in the superior segment of the right lower lobe unchanged compared with 01/31/2009. No further evaluation recommended at this time. 2. No active cardiopulmonary disease. 3.  Aortic Atherosclerosis (ICD10-I70.0). 4. Large hiatal hernia. Electronically Signed   By: Kathreen Devoid   On: 05/02/2017 16:49       Assessment & Plan:   Problem List Items Addressed This Visit    Abnormal chest CT    CT chest - stable nodule 04/2017.  Radiology recommend no further w/up.  Has seen pulmonary.        Essential hypertension    Blood pressure under good control.  Continue same medication regimen.  Follow pressures.  Follow metabolic panel.        Health care maintenance    Physical today 09/14/17.  Mammogram 11/30/16 - Birads I.  Colonoscopy 02/03/09 - diverticulosis.        History of breast cancer    S/p lumpectomy.  Mammogram 11/30/16 - Birads I.        Hypercholesteremia    On simvastatin.  Low cholesterol diet and exercise.  Follow lipid panel and liver function tests.        Type 2 diabetes mellitus without complications (HCC)    Low carb diet and exercise.  Follow met b and a1c.        Urinary frequency    Seeing urology.  Doing better.  Follow.         Other Visit Diagnoses    Anticoagulated    -  Primary   Relevant Orders   INR/PT (Completed)       Einar Pheasant, MD

## 2017-09-14 NOTE — Assessment & Plan Note (Addendum)
Physical today 09/14/17.  Mammogram 11/30/16 - Birads I.  Colonoscopy 02/03/09 - diverticulosis.

## 2017-09-15 LAB — PROTIME-INR
INR: 2.2 — AB
PROTHROMBIN TIME: 23.5 s — AB (ref 9.0–11.5)

## 2017-09-17 ENCOUNTER — Encounter: Payer: Self-pay | Admitting: Internal Medicine

## 2017-09-17 NOTE — Assessment & Plan Note (Addendum)
CT chest - stable nodule 04/2017.  Radiology recommend no further w/up.  Has seen pulmonary.

## 2017-09-17 NOTE — Assessment & Plan Note (Signed)
On simvastatin.  Low cholesterol diet and exercise.  Follow lipid panel and liver function tests.   

## 2017-09-17 NOTE — Assessment & Plan Note (Signed)
S/p lumpectomy.  Mammogram 11/30/16 - Birads I.

## 2017-09-17 NOTE — Assessment & Plan Note (Signed)
Low carb diet and exercise.  Follow met b and a1c.   

## 2017-09-17 NOTE — Assessment & Plan Note (Signed)
Seeing urology.  Doing better.  Follow.

## 2017-09-17 NOTE — Assessment & Plan Note (Signed)
Blood pressure under good control.  Continue same medication regimen.  Follow pressures.  Follow metabolic panel.   

## 2017-09-20 ENCOUNTER — Ambulatory Visit: Payer: Medicare Other | Admitting: Podiatry

## 2017-09-24 ENCOUNTER — Ambulatory Visit (INDEPENDENT_AMBULATORY_CARE_PROVIDER_SITE_OTHER): Payer: Medicare Other | Admitting: Podiatry

## 2017-09-24 ENCOUNTER — Encounter: Payer: Self-pay | Admitting: Podiatry

## 2017-09-24 DIAGNOSIS — M79609 Pain in unspecified limb: Secondary | ICD-10-CM

## 2017-09-24 DIAGNOSIS — B351 Tinea unguium: Secondary | ICD-10-CM | POA: Diagnosis not present

## 2017-09-24 DIAGNOSIS — E1142 Type 2 diabetes mellitus with diabetic polyneuropathy: Secondary | ICD-10-CM

## 2017-09-24 NOTE — Progress Notes (Signed)
Complaint:  Visit Type: Patient returns to my office for continued preventative foot care services. Complaint: Patient states" my nails have grown long and thick and become painful to walk and wear shoes" Patient has been diagnosed with DM with neuropathy. The patient presents for preventative foot care services. No changes to ROS.  Patient is taking coumadin.  Podiatric Exam: Vascular: dorsalis pedis  are palpable bilateral. Posterior tibial pulses are non palpable due to ankle/leg swelling. Capillary return is immediate. Temperature gradient is WNL. Skin turgor WNL  Sensorium: Diminished  Semmes Weinstein monofilament test. Normal tactile sensation bilaterally. Nail Exam: Pt has thick disfigured discolored nails with subungual debris noted bilateral entire nail hallux through fifth toenails.  Pincer hallux nails. Ulcer Exam: There is no evidence of ulcer or pre-ulcerative changes or infection. Orthopedic Exam: Muscle tone and strength are WNL. No limitations in general ROM. No crepitus or effusions noted. Foot type and digits show no abnormalities. Bony prominences are unremarkable. Skin: No Porokeratosis. No infection or ulcers  Diagnosis:  Onychomycosis, , Pain in right toe, pain in left toes  Treatment & Plan Procedures and Treatment: Consent by patient was obtained for treatment procedures. The patient understood the discussion of treatment and procedures well. All questions were answered thoroughly reviewed. Debridement of mycotic and hypertrophic toenails, 1 through 5 bilateral and clearing of subungual debris. No ulceration, no infection noted. Signed ABN for 2019. Return Visit-Office Procedure: Patient instructed to return to the office for a follow up visit 3 months for continued evaluation and treatment.    Gardiner Barefoot DPM

## 2017-09-26 ENCOUNTER — Ambulatory Visit (INDEPENDENT_AMBULATORY_CARE_PROVIDER_SITE_OTHER): Payer: Medicare Other

## 2017-09-26 DIAGNOSIS — N3941 Urge incontinence: Secondary | ICD-10-CM

## 2017-09-26 NOTE — Progress Notes (Signed)
PTNS  Session # Monthly Maintainance  Health & Social Factors: No Change Caffeine: 2 Alcohol: 0 Daytime voids #per day: 7 Night-time voids #per night: 1-2 Urgency: None Incontinence Episodes #per day: 0-1 Ankle used: Right Treatment Setting: 12 Feeling/ Response: Sensory Comments: Bilateral ankle swelling  Preformed By: Cristie Hem  Assistant: None Follow Up: As scheduled

## 2017-10-02 ENCOUNTER — Ambulatory Visit: Payer: Medicare Other | Admitting: Internal Medicine

## 2017-10-15 ENCOUNTER — Other Ambulatory Visit: Payer: Medicare Other

## 2017-10-15 DIAGNOSIS — M79605 Pain in left leg: Secondary | ICD-10-CM | POA: Diagnosis not present

## 2017-10-15 DIAGNOSIS — M545 Low back pain: Secondary | ICD-10-CM | POA: Diagnosis not present

## 2017-10-15 DIAGNOSIS — M47816 Spondylosis without myelopathy or radiculopathy, lumbar region: Secondary | ICD-10-CM | POA: Diagnosis not present

## 2017-10-15 DIAGNOSIS — Z6836 Body mass index (BMI) 36.0-36.9, adult: Secondary | ICD-10-CM | POA: Diagnosis not present

## 2017-10-16 ENCOUNTER — Telehealth: Payer: Self-pay | Admitting: *Deleted

## 2017-10-16 DIAGNOSIS — Z7901 Long term (current) use of anticoagulants: Secondary | ICD-10-CM

## 2017-10-16 NOTE — Telephone Encounter (Signed)
Standing order placed for PT/INR 

## 2017-10-17 ENCOUNTER — Other Ambulatory Visit (INDEPENDENT_AMBULATORY_CARE_PROVIDER_SITE_OTHER): Payer: Medicare Other

## 2017-10-17 DIAGNOSIS — Z7901 Long term (current) use of anticoagulants: Secondary | ICD-10-CM

## 2017-10-17 LAB — PROTIME-INR
INR: 2.4 ratio — ABNORMAL HIGH (ref 0.8–1.0)
Prothrombin Time: 27.6 s — ABNORMAL HIGH (ref 9.6–13.1)

## 2017-10-17 NOTE — Addendum Note (Signed)
Addended byElpidio Galea T on: 10/17/2017 01:52 PM   Modules accepted: Orders

## 2017-10-18 ENCOUNTER — Other Ambulatory Visit: Payer: Self-pay | Admitting: Internal Medicine

## 2017-10-18 DIAGNOSIS — E119 Type 2 diabetes mellitus without complications: Secondary | ICD-10-CM

## 2017-10-18 DIAGNOSIS — I10 Essential (primary) hypertension: Secondary | ICD-10-CM

## 2017-10-18 DIAGNOSIS — E7800 Pure hypercholesterolemia, unspecified: Secondary | ICD-10-CM

## 2017-10-18 NOTE — Progress Notes (Signed)
Orders placed for f/u labs.  

## 2017-10-22 DIAGNOSIS — M47816 Spondylosis without myelopathy or radiculopathy, lumbar region: Secondary | ICD-10-CM | POA: Diagnosis not present

## 2017-10-24 ENCOUNTER — Ambulatory Visit (INDEPENDENT_AMBULATORY_CARE_PROVIDER_SITE_OTHER): Payer: Medicare Other

## 2017-10-24 DIAGNOSIS — N3941 Urge incontinence: Secondary | ICD-10-CM

## 2017-10-24 NOTE — Progress Notes (Signed)
PTNS  Session # monthly  Health & Social Factors: same Caffeine: 2 Alcohol: 0 Daytime voids #per day: 6-7 Night-time voids #per night: 2 Urgency: none Incontinence Episodes #per day: 0 Ankle used: right Treatment Setting: 8 Feeling/ Response: sensory Comments: none  Preformed By: Fonnie Jarvis, CMA   Follow Up: Month

## 2017-10-30 ENCOUNTER — Other Ambulatory Visit: Payer: Self-pay | Admitting: Internal Medicine

## 2017-10-30 DIAGNOSIS — Z1231 Encounter for screening mammogram for malignant neoplasm of breast: Secondary | ICD-10-CM

## 2017-11-14 ENCOUNTER — Other Ambulatory Visit (INDEPENDENT_AMBULATORY_CARE_PROVIDER_SITE_OTHER): Payer: Medicare Other

## 2017-11-14 DIAGNOSIS — E119 Type 2 diabetes mellitus without complications: Secondary | ICD-10-CM | POA: Diagnosis not present

## 2017-11-14 DIAGNOSIS — E7800 Pure hypercholesterolemia, unspecified: Secondary | ICD-10-CM

## 2017-11-14 DIAGNOSIS — I10 Essential (primary) hypertension: Secondary | ICD-10-CM

## 2017-11-14 DIAGNOSIS — Z7901 Long term (current) use of anticoagulants: Secondary | ICD-10-CM | POA: Diagnosis not present

## 2017-11-14 DIAGNOSIS — Z23 Encounter for immunization: Secondary | ICD-10-CM

## 2017-11-14 LAB — MICROALBUMIN / CREATININE URINE RATIO
Creatinine,U: 23.3 mg/dL
Microalb Creat Ratio: 3 mg/g (ref 0.0–30.0)
Microalb, Ur: <0.7 mg/dL (ref 0.0–1.9)

## 2017-11-14 LAB — CBC WITH DIFFERENTIAL/PLATELET
BASOS ABS: 0 10*3/uL (ref 0.0–0.1)
Basophils Relative: 0.6 % (ref 0.0–3.0)
EOS ABS: 0.1 10*3/uL (ref 0.0–0.7)
Eosinophils Relative: 2.3 % (ref 0.0–5.0)
HCT: 40.3 % (ref 36.0–46.0)
HEMOGLOBIN: 13.5 g/dL (ref 12.0–15.0)
LYMPHS PCT: 33.5 % (ref 12.0–46.0)
Lymphs Abs: 1.9 10*3/uL (ref 0.7–4.0)
MCHC: 33.6 g/dL (ref 30.0–36.0)
MCV: 89.2 fl (ref 78.0–100.0)
MONO ABS: 0.7 10*3/uL (ref 0.1–1.0)
Monocytes Relative: 12.3 % — ABNORMAL HIGH (ref 3.0–12.0)
Neutro Abs: 3 10*3/uL (ref 1.4–7.7)
Neutrophils Relative %: 51.3 % (ref 43.0–77.0)
Platelets: 204 10*3/uL (ref 150.0–400.0)
RBC: 4.51 Mil/uL (ref 3.87–5.11)
RDW: 14.4 % (ref 11.5–15.5)
WBC: 5.8 10*3/uL (ref 4.0–10.5)

## 2017-11-14 LAB — HEMOGLOBIN A1C: Hgb A1c MFr Bld: 6.1 % (ref 4.6–6.5)

## 2017-11-14 LAB — HEPATIC FUNCTION PANEL
ALBUMIN: 3.9 g/dL (ref 3.5–5.2)
ALT: 18 U/L (ref 0–35)
AST: 22 U/L (ref 0–37)
Alkaline Phosphatase: 38 U/L — ABNORMAL LOW (ref 39–117)
Bilirubin, Direct: 0.1 mg/dL (ref 0.0–0.3)
Total Bilirubin: 0.7 mg/dL (ref 0.2–1.2)
Total Protein: 6.7 g/dL (ref 6.0–8.3)

## 2017-11-14 LAB — PROTIME-INR
INR: 1.9 ratio — ABNORMAL HIGH (ref 0.8–1.0)
PROTHROMBIN TIME: 21.8 s — AB (ref 9.6–13.1)

## 2017-11-14 LAB — LIPID PANEL
CHOL/HDL RATIO: 3
Cholesterol: 128 mg/dL (ref 0–200)
HDL: 50.2 mg/dL (ref >39.00)
LDL CALC: 62 mg/dL (ref 0–99)
NONHDL: 77.59
TRIGLYCERIDES: 76 mg/dL (ref 0.0–149.0)
VLDL: 15.2 mg/dL (ref 0.0–40.0)

## 2017-11-14 LAB — BASIC METABOLIC PANEL
BUN: 15 mg/dL (ref 6–23)
CHLORIDE: 103 meq/L (ref 96–112)
CO2: 28 meq/L (ref 19–32)
CREATININE: 0.88 mg/dL (ref 0.40–1.20)
Calcium: 9.5 mg/dL (ref 8.4–10.5)
GFR: 65.77 mL/min (ref >60.00)
GLUCOSE: 110 mg/dL — AB (ref 70–99)
Potassium: 4.3 mEq/L (ref 3.5–5.1)
Sodium: 138 mEq/L (ref 135–145)

## 2017-11-14 LAB — TSH: TSH: 4.09 u[IU]/mL (ref 0.35–4.50)

## 2017-11-19 ENCOUNTER — Other Ambulatory Visit: Payer: Self-pay | Admitting: Internal Medicine

## 2017-11-19 DIAGNOSIS — Z7901 Long term (current) use of anticoagulants: Secondary | ICD-10-CM

## 2017-11-19 NOTE — Progress Notes (Signed)
Order placed for pt/inr 

## 2017-11-21 ENCOUNTER — Ambulatory Visit (INDEPENDENT_AMBULATORY_CARE_PROVIDER_SITE_OTHER): Payer: Medicare Other

## 2017-11-21 DIAGNOSIS — N3941 Urge incontinence: Secondary | ICD-10-CM | POA: Diagnosis not present

## 2017-11-21 NOTE — Progress Notes (Signed)
PTNS  Session # monthly  Health & Social Factors: no change Caffeine: 2 Alcohol: 0 Daytime voids #per day: 8-9 Night-time voids #per night: 2 Urgency: none Incontinence Episodes #per day: 2 Ankle used: right Treatment Setting: 7 Feeling/ Response: sensory Comments: none  Preformed By: Fonnie Jarvis, CMA   Follow Up: 3 wk

## 2017-11-26 ENCOUNTER — Encounter: Payer: Self-pay | Admitting: Podiatry

## 2017-11-26 ENCOUNTER — Ambulatory Visit (INDEPENDENT_AMBULATORY_CARE_PROVIDER_SITE_OTHER): Payer: Medicare Other | Admitting: Podiatry

## 2017-11-26 DIAGNOSIS — E1142 Type 2 diabetes mellitus with diabetic polyneuropathy: Secondary | ICD-10-CM

## 2017-11-26 DIAGNOSIS — M79609 Pain in unspecified limb: Secondary | ICD-10-CM

## 2017-11-26 DIAGNOSIS — B351 Tinea unguium: Secondary | ICD-10-CM | POA: Diagnosis not present

## 2017-11-26 DIAGNOSIS — D689 Coagulation defect, unspecified: Secondary | ICD-10-CM

## 2017-11-26 NOTE — Progress Notes (Signed)
Complaint:  Visit Type: Patient returns to my office for continued preventative foot care services. Complaint: Patient states" my nails have grown long and thick and become painful to walk and wear shoes" Patient has been diagnosed with DM with neuropathy. The patient presents for preventative foot care services. No changes to ROS.  Patient is taking coumadin.  Podiatric Exam: Vascular: dorsalis pedis  are palpable bilateral. Posterior tibial pulses are non palpable due to ankle/leg swelling. Capillary return is immediate. Temperature gradient is WNL. Skin turgor WNL  Sensorium: Diminished  Semmes Weinstein monofilament test. Normal tactile sensation bilaterally. Nail Exam: Pt has thick disfigured discolored nails with subungual debris noted bilateral entire nail hallux through fifth toenails.  Pincer hallux nails. Ulcer Exam: There is no evidence of ulcer or pre-ulcerative changes or infection. Orthopedic Exam: Muscle tone and strength are WNL. No limitations in general ROM. No crepitus or effusions noted. Foot type and digits show no abnormalities. Bony prominences are unremarkable. Skin: No Porokeratosis. No infection or ulcers  Diagnosis:  Onychomycosis, , Pain in right toe, pain in left toes  Treatment & Plan Procedures and Treatment: Consent by patient was obtained for treatment procedures. The patient understood the discussion of treatment and procedures well. All questions were answered thoroughly reviewed. Debridement of mycotic and hypertrophic toenails, 1 through 5 bilateral and clearing of subungual debris. No ulceration, no infection noted. Signed ABN for 2019. Return Visit-Office Procedure: Patient instructed to return to the office for a follow up visit 3 months for continued evaluation and treatment.    Gardiner Barefoot DPM

## 2017-11-27 ENCOUNTER — Other Ambulatory Visit (INDEPENDENT_AMBULATORY_CARE_PROVIDER_SITE_OTHER): Payer: Medicare Other

## 2017-11-27 DIAGNOSIS — Z7901 Long term (current) use of anticoagulants: Secondary | ICD-10-CM | POA: Diagnosis not present

## 2017-11-27 LAB — PROTIME-INR
INR: 2.1 ratio — AB (ref 0.8–1.0)
PROTHROMBIN TIME: 24.4 s — AB (ref 9.6–13.1)

## 2017-11-28 ENCOUNTER — Other Ambulatory Visit: Payer: Self-pay | Admitting: Internal Medicine

## 2017-11-30 ENCOUNTER — Ambulatory Visit
Admission: RE | Admit: 2017-11-30 | Discharge: 2017-11-30 | Disposition: A | Discharge status: Home / Self Care | Payer: Medicare Other | Source: Ambulatory Visit | Attending: Internal Medicine | Admitting: Internal Medicine

## 2017-11-30 DIAGNOSIS — Z1231 Encounter for screening mammogram for malignant neoplasm of breast: Secondary | ICD-10-CM

## 2017-12-13 ENCOUNTER — Ambulatory Visit (INDEPENDENT_AMBULATORY_CARE_PROVIDER_SITE_OTHER): Payer: Medicare Other | Admitting: Family Medicine

## 2017-12-13 DIAGNOSIS — N3941 Urge incontinence: Secondary | ICD-10-CM

## 2017-12-13 NOTE — Progress Notes (Signed)
PTNS  Session # monthly maint.  Health & Social Factors: no change Caffeine: 2 Alcohol: 0 Daytime voids #per day: 8 Night-time voids #per night: 2 Urgency: none Incontinence Episodes #per day: 1 Ankle used: right Treatment Setting: 17 Feeling/ Response: sensory Comments: patient tolerated well  Preformed By: Elberta Leatherwood, CMA  Follow Up: 3 weeks PTNS

## 2017-12-26 ENCOUNTER — Other Ambulatory Visit (INDEPENDENT_AMBULATORY_CARE_PROVIDER_SITE_OTHER): Payer: Medicare Other

## 2017-12-26 DIAGNOSIS — Z7901 Long term (current) use of anticoagulants: Secondary | ICD-10-CM | POA: Diagnosis not present

## 2017-12-26 LAB — PROTIME-INR
INR: 2.2 ratio — AB (ref 0.8–1.0)
PROTHROMBIN TIME: 25.9 s — AB (ref 9.6–13.1)

## 2018-01-03 ENCOUNTER — Ambulatory Visit (INDEPENDENT_AMBULATORY_CARE_PROVIDER_SITE_OTHER): Payer: Medicare Other | Admitting: Family Medicine

## 2018-01-03 DIAGNOSIS — N3941 Urge incontinence: Secondary | ICD-10-CM | POA: Diagnosis not present

## 2018-01-03 MED ORDER — FESOTERODINE FUMARATE ER 8 MG PO TB24: 8.0000 mg | ORAL_TABLET | Freq: Every day | ORAL | 11 refills | Status: DC

## 2018-01-03 NOTE — Addendum Note (Signed)
Addended by: Kyra Manges on: 01/03/2018 02:44 PM   Modules accepted: Orders

## 2018-01-03 NOTE — Progress Notes (Signed)
PTNS  Session # monthly maint.  Health & Social Factors: no change Caffeine: 2 Alcohol: 0 Daytime voids #per day: 8 Night-time voids #per night: 2 Urgency: none Incontinence Episodes #per day: 0 Ankle used: right Treatment Setting: 13 Feeling/ Response: sensory Comments: Patient tolerated well  Preformed By: Elberta Leatherwood, CMA  Follow Up: 1 month

## 2018-01-15 ENCOUNTER — Ambulatory Visit (INDEPENDENT_AMBULATORY_CARE_PROVIDER_SITE_OTHER): Payer: Medicare Other | Admitting: Internal Medicine

## 2018-01-15 VITALS — BP 130/78 | HR 73 | Temp 97.8°F | Resp 18 | Wt 242.4 lb

## 2018-01-15 DIAGNOSIS — I10 Essential (primary) hypertension: Secondary | ICD-10-CM

## 2018-01-15 DIAGNOSIS — Z7901 Long term (current) use of anticoagulants: Secondary | ICD-10-CM | POA: Diagnosis not present

## 2018-01-15 DIAGNOSIS — E119 Type 2 diabetes mellitus without complications: Secondary | ICD-10-CM

## 2018-01-15 DIAGNOSIS — E7800 Pure hypercholesterolemia, unspecified: Secondary | ICD-10-CM

## 2018-01-15 DIAGNOSIS — Z853 Personal history of malignant neoplasm of breast: Secondary | ICD-10-CM | POA: Diagnosis not present

## 2018-01-15 MED ORDER — PANTOPRAZOLE SODIUM 40 MG PO TBEC: 40.0000 mg | DELAYED_RELEASE_TABLET | Freq: Every day | ORAL | 1 refills | Status: DC

## 2018-01-15 MED ORDER — SIMVASTATIN 10 MG PO TABS: ORAL_TABLET | ORAL | 1 refills | Status: DC

## 2018-01-15 NOTE — Progress Notes (Signed)
Patient ID: Abagail Rowland [place], female   DOB: 19-Jun-1940, 77 y.o.   MRN: [id number]   Subjective:    Patient ID: Kalany Rowland [place], female    DOB: 29-Dec-1940, 77 y.o.   MRN: [id number]  HPI  Patient here for a scheduled follow up.  She reports she is doing well.  Staying active.  No chest pain.  No sob.  No acid reflux.  No abdominal pain.  No cough or congestion.  Feels breathing doing well.  Back is doing well.  Overall feels good.     Past Medical History:  Diagnosis Date  . Anemia   . Arthritis   . Atrial fibrillation ([place])   . Breast cancer ([place])    s/p lumpectomy 1992.  s/p chemo and xrt left breast  . Diabetes mellitus ([place])   . Edema    feet/legs  . Gastric ulcer   . GERD (gastroesophageal reflux disease)   . HOH (hard of hearing)    aides  . Hypercholesterolemia   . Hypertension   . Pulmonary emboli [facility]    Past Surgical History:  Procedure Laterality Date  . BREAST LUMPECTOMY  03/06/1990   left breast  . CATARACT EXTRACTION W/PHACO Right 12/26/2016   Procedure: CATARACT EXTRACTION PHACO AND INTRAOCULAR LENS PLACEMENT (IOC)-RIGHT DIABETIC;  Surgeon: Birder Robson, MD;  Location: [facility] ORS;  Service: Ophthalmology;  Laterality: Right;  [place] 00:47 AP% 24.5 CDE 11.62 Fluid pack lot # [id number]  . CATARACT EXTRACTION W/PHACO Left 01/23/2017   Procedure: CATARACT EXTRACTION PHACO AND INTRAOCULAR LENS PLACEMENT (IOC);  Surgeon: Birder Robson, MD;  Location: [facility] ORS;  Service: Ophthalmology;  Laterality: Left;  [place] 00:50 AP% 16.1 CDE 8.18 Fluid pack lot #[id number]   Family History  Problem Relation Age of Onset  . Heart disease Father   . Heart disease Brother        s/p CABG  . Colon cancer Neg Hx   . Breast cancer Neg Hx    Social History   Socioeconomic History  . Marital status: Married    Spouse name: Not on file  . Number of children: Not on file  . Years of education: Not on file  . Highest education level: Not on file  Occupational History    . Not on file  Social Needs  . Financial resource strain: Not on file  . Food insecurity:    Worry: Not on file    Inability: Not on file  . Transportation needs:    Medical: Not on file    Non-medical: Not on file  Tobacco Use  . Smoking status: Never Smoker  . Smokeless tobacco: Never Used  Substance and Sexual Activity  . Alcohol use: No    Alcohol/week: 0.0 standard drinks  . Drug use: No  . Sexual activity: Not on file  Lifestyle  . Physical activity:    Days per week: Not on file    Minutes per session: Not on file  . Stress: Not on file  Relationships  . Social connections:    Talks on phone: Not on file    Gets together: Not on file    Attends religious service: Not on file    Active member of club or organization: Not on file    Attends meetings of clubs or organizations: Not on file    Relationship status: Not on file  Other Topics Concern  . Not on file  Social History Narrative  . Not on file  Outpatient Encounter Medications as of 01/15/2018  Medication Sig  . CONTOUR TEST test strip USE TO TEST BLOOD SUGAR ONCE EVERY WEEK; IF DOSING HAS CHANGED PLEASE SUBMIT A NEW RX  . diphenhydrAMINE (BENADRYL) 25 mg capsule Take 25 mg at bedtime as needed by mouth for sleep.   Marland Kitchen FEROSUL 325 (65 Fe) MG tablet TAKE 1 TABLET BY MOUTH EVERY DAY  . fesoterodine (TOVIAZ) 8 MG TB24 tablet Take 1 tablet (8 mg total) by mouth daily.  Marland Kitchen losartan (COZAAR) 100 MG tablet TAKE 1 TABLET BY MOUTH EVERY DAY  . Multiple Vitamin (MULTIVITAMIN WITH MINERALS) TABS tablet Take 1 tablet daily by mouth.  . pantoprazole (PROTONIX) 40 MG tablet Take 1 tablet (40 mg total) by mouth daily.  . simvastatin (ZOCOR) 10 MG tablet TAKE ONE (1) TABLET BY MOUTH EVERY DAY  . warfarin (COUMADIN) 2.5 MG tablet Takes 2 tablets (5 mg) on Tuesdays and Thursdays and 1 tablet (2.5 mg)  all other days of the week (Patient taking differently: Take 2.5 mg by mouth. Takes 2 tablets (5 mg) on Tuesdays, Thursdays  and Saturday and 1 tablet (2.5 mg)  all other days of the week)  . [DISCONTINUED] Difluprednate (DUREZOL) 0.05 % EMUL Place 1 drop 2 (two) times daily into the right eye.  . [DISCONTINUED] Ferrous Sulfate Dried (FERROUS SULFATE CR PO) ferrous sulfate  . [DISCONTINUED] pantoprazole (PROTONIX) 40 MG tablet Take 1 tablet (40 mg total) by mouth daily.  . [DISCONTINUED] simvastatin (ZOCOR) 10 MG tablet TAKE ONE (1) TABLET BY MOUTH EVERY DAY   No facility-administered encounter medications on file as of 01/15/2018.     Review of Systems  Constitutional: Negative for appetite change and unexpected weight change.  HENT: Negative for congestion and sinus pressure.   Respiratory: Negative for cough, chest tightness and shortness of breath.   Cardiovascular: Negative for chest pain, palpitations and leg swelling.  Gastrointestinal: Negative for abdominal pain, diarrhea, nausea and vomiting.  Genitourinary: Negative for difficulty urinating and dysuria.  Musculoskeletal: Negative for joint swelling and myalgias.  Skin: Negative for color change and rash.  Neurological: Negative for dizziness, light-headedness and headaches.  Psychiatric/Behavioral: Negative for agitation and dysphoric mood.       Objective:     Blood pressure rechecked by me:  130/78  Physical Exam  Constitutional: She appears well-developed and well-nourished. No distress.  HENT:  Nose: Nose normal.  Mouth/Throat: Oropharynx is clear and moist.  Neck: Neck supple. No thyromegaly present.  Cardiovascular: Normal rate and regular rhythm.  Pulmonary/Chest: Breath sounds normal. No respiratory distress. She has no wheezes.  Abdominal: Soft. Bowel sounds are normal. There is no tenderness.  Musculoskeletal: She exhibits no edema or tenderness.  Lymphadenopathy:    She has no cervical adenopathy.  Skin: No rash noted. No erythema.  Psychiatric: She has a normal mood and affect. Her behavior is normal.    BP 130/78   Pulse  73   Temp 97.8 F (36.6 C) (Oral)   Resp 18   Wt 242 lb 6.4 oz (110 kg)   SpO2 98%   BMI 38.25 kg/m  Wt Readings from Last 3 Encounters:  01/15/18 242 lb 6.4 oz (110 kg)  09/14/17 242 lb 3.2 oz (109.9 kg)  08/22/17 242 lb 1.6 oz (109.8 kg)     Lab Results  Component Value Date   WBC 5.8 11/14/2017   HGB 13.5 11/14/2017   HCT 40.3 11/14/2017   PLT 204.0 11/14/2017   GLUCOSE 110 (H) 11/14/2017  CHOL 128 11/14/2017   TRIG 76.0 11/14/2017   HDL 50.20 11/14/2017   LDLCALC 62 11/14/2017   ALT 18 11/14/2017   AST 22 11/14/2017   NA 138 11/14/2017   K 4.3 11/14/2017   CL 103 11/14/2017   CREATININE 0.88 11/14/2017   BUN 15 11/14/2017   CO2 28 11/14/2017   TSH 4.09 11/14/2017   INR 1.9 (H) 01/15/2018   HGBA1C 6.1 11/14/2017   MICROALBUR <0.7 11/14/2017    Mm 3d Screen Breast Bilateral  Result Date: 12/03/2017 CLINICAL DATA:  Screening. EXAM: DIGITAL SCREENING BILATERAL MAMMOGRAM WITH TOMO AND CAD COMPARISON:  Previous exam(s). ACR Breast Density Category b: There are scattered areas of fibroglandular density. FINDINGS: There are no findings suspicious for malignancy. Images were processed with CAD. IMPRESSION: No mammographic evidence of malignancy. A result letter of this screening mammogram will be mailed directly to the patient. RECOMMENDATION: Screening mammogram in one year. (Code:SM-B-01Y) BI-RADS CATEGORY  1: Negative. Electronically Signed   By: Kristopher Oppenheim M.D.   On: 12/03/2017 10:29       Assessment & Plan:   Problem List Items Addressed This Visit    Essential hypertension    Blood pressure on recheck improved.  Continue current medication regimen.  Follow pressures.  Follow metabolic panel.        Relevant Medications   simvastatin (ZOCOR) 10 MG tablet   History of breast cancer    S/p lumpectomy.  Mammogram 12/03/17 - birads I.       Hypercholesteremia    On simvastatin.  Low cholesterol diet and exercise.  Follow lipid panel and liver function  tests.        Relevant Medications   simvastatin (ZOCOR) 10 MG tablet   Long term current use of anticoagulant therapy    Recheck pt/inr today.        Type 2 diabetes mellitus without complications (HCC)    Low carb diet and exercise.  Follow met b and a1c.        Relevant Medications   simvastatin (ZOCOR) 10 MG tablet    Other Visit Diagnoses    Anticoagulated on Coumadin    -  Primary   Relevant Orders   Protime-INR (Completed)       Einar Pheasant, MD

## 2018-01-16 LAB — PROTIME-INR
INR: 1.9 — AB
Prothrombin Time: 18.7 s — ABNORMAL HIGH (ref 9.0–11.5)

## 2018-01-20 ENCOUNTER — Encounter: Payer: Self-pay | Admitting: Internal Medicine

## 2018-01-20 NOTE — Assessment & Plan Note (Signed)
On simvastatin.  Low cholesterol diet and exercise.  Follow lipid panel and liver function tests.   

## 2018-01-20 NOTE — Assessment & Plan Note (Signed)
Low carb diet and exercise.  Follow met b and a1c.   

## 2018-01-20 NOTE — Assessment & Plan Note (Signed)
S/p lumpectomy.  Mammogram 12/03/17 - birads I.

## 2018-01-20 NOTE — Assessment & Plan Note (Signed)
Blood pressure on recheck improved.  Continue current medication regimen.  Follow pressures.  Follow metabolic panel.   

## 2018-01-20 NOTE — Assessment & Plan Note (Signed)
Recheck pt/inr today.   

## 2018-01-24 ENCOUNTER — Ambulatory Visit (INDEPENDENT_AMBULATORY_CARE_PROVIDER_SITE_OTHER): Payer: Medicare Other

## 2018-01-24 DIAGNOSIS — N3941 Urge incontinence: Secondary | ICD-10-CM

## 2018-01-24 NOTE — Progress Notes (Signed)
PTNS  Session # monthly  Health & Social Factors: no change Caffeine: 2 Alcohol: 0 Daytime voids #per day: 8 Night-time voids #per night: 2 Urgency: none Incontinence Episodes #per day: 0-1 Ankle used: right  Treatment Setting: 19 Feeling/ Response: toe  Preformed By: Fonnie Jarvis, CMA  Follow Up: 3 wk

## 2018-01-25 ENCOUNTER — Other Ambulatory Visit (INDEPENDENT_AMBULATORY_CARE_PROVIDER_SITE_OTHER): Payer: Medicare Other

## 2018-01-25 DIAGNOSIS — Z7901 Long term (current) use of anticoagulants: Secondary | ICD-10-CM | POA: Diagnosis not present

## 2018-01-25 LAB — PROTIME-INR
INR: 2.2 ratio — ABNORMAL HIGH (ref 0.8–1.0)
Prothrombin Time: 24.9 s — ABNORMAL HIGH (ref 9.6–13.1)

## 2018-02-03 ENCOUNTER — Other Ambulatory Visit: Payer: Self-pay | Admitting: Internal Medicine

## 2018-02-04 ENCOUNTER — Encounter: Payer: Self-pay | Admitting: Podiatry

## 2018-02-04 ENCOUNTER — Other Ambulatory Visit: Payer: Self-pay

## 2018-02-04 ENCOUNTER — Ambulatory Visit (INDEPENDENT_AMBULATORY_CARE_PROVIDER_SITE_OTHER): Payer: Medicare Other | Admitting: Podiatry

## 2018-02-04 DIAGNOSIS — M79609 Pain in unspecified limb: Secondary | ICD-10-CM | POA: Diagnosis not present

## 2018-02-04 DIAGNOSIS — B351 Tinea unguium: Secondary | ICD-10-CM

## 2018-02-04 DIAGNOSIS — D689 Coagulation defect, unspecified: Secondary | ICD-10-CM

## 2018-02-04 DIAGNOSIS — E1142 Type 2 diabetes mellitus with diabetic polyneuropathy: Secondary | ICD-10-CM

## 2018-02-04 MED ORDER — WARFARIN SODIUM 2.5 MG PO TABS: ORAL_TABLET | ORAL | 0 refills | Status: DC

## 2018-02-04 MED ORDER — WARFARIN SODIUM 2.5 MG PO TABS: ORAL_TABLET | ORAL | 2 refills | Status: DC

## 2018-02-04 NOTE — Progress Notes (Signed)
Complaint:  Visit Type: Patient returns to my office for continued preventative foot care services. Complaint: Patient states" my nails have grown long and thick and become painful to walk and wear shoes" Patient has been diagnosed with DM with neuropathy. The patient presents for preventative foot care services. No changes to ROS.  Patient is taking coumadin.  Podiatric Exam: Vascular: dorsalis pedis  are palpable bilateral. Posterior tibial pulses are non palpable due to ankle/leg swelling. Capillary return is immediate. Temperature gradient is WNL. Skin turgor WNL  Sensorium: Diminished  Semmes Weinstein monofilament test. Normal tactile sensation bilaterally. Nail Exam: Pt has thick disfigured discolored nails with subungual debris noted bilateral entire nail hallux through fifth toenails.  Pincer hallux nails. Ulcer Exam: There is no evidence of ulcer or pre-ulcerative changes or infection. Orthopedic Exam: Muscle tone and strength are WNL. No limitations in general ROM. No crepitus or effusions noted. Foot type and digits show no abnormalities. Bony prominences are unremarkable. Skin: No Porokeratosis. No infection or ulcers  Diagnosis:  Onychomycosis, , Pain in right toe, pain in left toes  Treatment & Plan Procedures and Treatment: Consent by patient was obtained for treatment procedures. The patient understood the discussion of treatment and procedures well. All questions were answered thoroughly reviewed. Debridement of mycotic and hypertrophic toenails, 1 through 5 bilateral and clearing of subungual debris. No ulceration, no infection noted. Signed ABN for 2019. Return Visit-Office Procedure: Patient instructed to return to the office for a follow up visit 10 weeks  for continued evaluation and treatment.    Gardiner Barefoot DPM

## 2018-02-11 NOTE — Progress Notes (Signed)
02/12/2018  4:01 PM   Anita Walker April 28, 1940 [id number]  Referring provider: Einar Pheasant, [address] [place], [place] [address]  Chief Complaint  Patient presents with   Follow-up    HPI: Anita Walker is a 77 year old Caucasian female presenting today for PTNS and 6 month follow up. She has a history of urinary incontinence, nocturia and a cystocele.  Background history On 08/22/2017, I was consulted to assess the patient's urgency incontinence which primarily occurs with foot on the floor syndrome in the middle of the night. She was experiencing less frequently during the day she can have urgency incontinence and denied stress incontinence.  At night she wore 1 pad that would become moderately wet.  She voided every 2 hours during the day but sometimes every 15-30 minutes after fluids.  She was experiencing nocturia 3x nightly and her pelvic examination was limited by her immobility.  She had a grade 2 cystocele that almost reached the introitus.  She had no stress incontinence. I noted the patient to have primarily urgency incontinence with mild frequency and nocturia.  She likely has a component of a nocturnal diuresis.  If overactive bladder medications did not reach her treatment goal desmopressin would also be an option.  I may need to cystoscope her in the future but she had no blood in the urine on 08/2017.    The patient has failed Toviaz oxybutynin and Myrbetriq.  She has completed her 12 weekly treatments of PTNS and she has found benefit with the treatments and would like to continue.    Today, she has been experiencing urgency x 0-3 (unchanged), frequency x 8 or more daily (unchanged), has started restricting fluids to avoid visits to the restroom as of 02/12/2018, started engaging in toilet mapping as of 02/12/2018, incontinence x 3 (worsening) and nocturia x 0-3 (unchanged).  She reports her frequency is usually as follows  daily. She admits that once she wakes up she will use the restroom about twice before her coffee then 3 times after. While she's out she will probably use it twice then another two times when she returns home before bed.  She is currently drinking 2 cups of coffee daily, and she is not willing to stop or limit coffee drinking. She reports that she is currently drinking about 2 cups of water a day.  Today her PVR is 66ml. Her previous PVR was 42 mL.    Patient denies any gross hematuria, dysuria or suprapubic/flank pain.  Patient denies any fevers, chills, nausea or vomiting.   PMH: Past Medical History:  Diagnosis Date   Anemia    Arthritis    Atrial fibrillation ([place])    Breast cancer ([place])    s/p lumpectomy 1992.  s/p chemo and xrt left breast   Diabetes mellitus ([place])    Edema    feet/legs   Gastric ulcer    GERD (gastroesophageal reflux disease)    HOH (hard of hearing)    aides   Hypercholesterolemia    Hypertension    Pulmonary emboli [facility])    Surgical History: Past Surgical History:  Procedure Laterality Date   BREAST LUMPECTOMY  03/06/1990   left breast   CATARACT EXTRACTION W/PHACO Right 12/26/2016   Procedure: CATARACT EXTRACTION PHACO AND INTRAOCULAR LENS PLACEMENT (IOC)-RIGHT DIABETIC;  Surgeon: Birder Robson, MD;  Location: [facility] ORS;  Service: Ophthalmology;  Laterality: Right;  [place] 00:47 AP% 24.5 CDE 11.62 Fluid pack lot # [id number]  CATARACT EXTRACTION W/PHACO Left 01/23/2017   Procedure: CATARACT EXTRACTION PHACO AND INTRAOCULAR LENS PLACEMENT (IOC);  Surgeon: Birder Robson, MD;  Location: [facility] ORS;  Service: Ophthalmology;  Laterality: Left;  [place] 00:50 AP% 16.1 CDE 8.18 Fluid pack lot #[id number]   Home Medications:  Allergies as of 02/12/2018      Reactions   Penicillins Other (See Comments)   Unknown- pt states been a long time ago  Has patient had a PCN reaction causing immediate rash, facial/tongue/throat swelling, SOB or  lightheadedness with hypotension: Unknown Has patient had a PCN reaction causing severe rash involving mucus membranes or skin necrosis: Unknown Has patient had a PCN reaction that required hospitalization: Unknown Has patient had a PCN reaction occurring within the last 10 years: Unknown If all of the above answers are "NO", then may proceed with Cephalosporin use.   Penicillin V Potassium Nausea And Vomiting      Medication List        Accurate as of 02/12/18  4:01 PM. Always use your most recent med list.          CONTOUR TEST test strip Generic drug:  glucose blood USE TO TEST BLOOD SUGAR ONCE EVERY WEEK; IF DOSING HAS CHANGED PLEASE SUBMIT A NEW RX   diphenhydrAMINE 25 mg capsule Commonly known as:  BENADRYL Take 25 mg at bedtime as needed by mouth for sleep.   FEROSUL 325 (65 FE) MG tablet Generic drug:  ferrous sulfate TAKE 1 TABLET BY MOUTH EVERY DAY   fesoterodine 8 MG Tb24 tablet Commonly known as:  TOVIAZ Take 1 tablet (8 mg total) by mouth daily.   losartan 100 MG tablet Commonly known as:  COZAAR TAKE 1 TABLET BY MOUTH EVERY DAY   multivitamin with minerals Tabs tablet Take 1 tablet daily by mouth.   pantoprazole 40 MG tablet Commonly known as:  PROTONIX Take 1 tablet (40 mg total) by mouth daily.   simvastatin 10 MG tablet Commonly known as:  ZOCOR TAKE ONE (1) TABLET BY MOUTH EVERY DAY   warfarin 2.5 MG tablet Commonly known as:  COUMADIN Takes 2 tablets (5 mg) on Tuesdays and Thursdays and Saturday and 1 tablet (2.5 mg)  all other days of the week      Allergies:  Allergies  Allergen Reactions   Penicillins Other (See Comments)    Unknown- pt states been a long time ago  Has patient had a PCN reaction causing immediate rash, facial/tongue/throat swelling, SOB or lightheadedness with hypotension: Unknown Has patient had a PCN reaction causing severe rash involving mucus membranes or skin necrosis: Unknown Has patient had a PCN reaction that  required hospitalization: Unknown Has patient had a PCN reaction occurring within the last 10 years: Unknown If all of the above answers are "NO", then may proceed with Cephalosporin use.   Penicillin V Potassium Nausea And Vomiting   Family History: Family History  Problem Relation Age of Onset   Heart disease Father    Heart disease Brother        s/p CABG   Colon cancer Neg Hx    Breast cancer Neg Hx    Social History:  reports that she has never smoked. She has never used smokeless tobacco. She reports that she does not drink alcohol or use drugs.  ROS: UROLOGY Frequent Urination?: Yes Hard to postpone urination?: Yes Burning/pain with urination?: No Get up at night to urinate?: Yes Leakage of urine?: Yes Urine stream starts and stops?: No Trouble starting stream?:  No Do you have to strain to urinate?: No Blood in urine?: No Urinary tract infection?: No Sexually transmitted disease?: No Injury to kidneys or bladder?: No Painful intercourse?: No Weak stream?: No Currently pregnant?: No Vaginal bleeding?: No Last menstrual period?: n  Gastrointestinal Nausea?: No Vomiting?: No Indigestion/heartburn?: No Diarrhea?: No Constipation?: No  Constitutional Fever: No Night sweats?: No Weight loss?: No Fatigue?: No  Skin Skin rash/lesions?: No Itching?: No  Eyes Blurred vision?: No Double vision?: No  Ears/Nose/Throat Sore throat?: No Sinus problems?: No  Hematologic/Lymphatic Swollen glands?: No Easy bruising?: No  Cardiovascular Leg swelling?: No Chest pain?: No  Respiratory Cough?: No Shortness of breath?: No  Endocrine Excessive thirst?: No  Musculoskeletal Back pain?: No Joint pain?: No  Neurological Headaches?: No Dizziness?: No  Psychologic Depression?: No Anxiety?: No  Physical Exam: BP 138/82    Pulse 82    Ht 5' 6.75" (1.695 m)    Wt 243 lb (110.2 kg)    BMI 38.34 kg/m   Constitutional: Well nourished. Alert and  oriented, No acute distress. Head: Normocephalic and Atraumatic. Respiratory: Normal respiratory effort, no increased work of breathing. Skin: No rashes, bruises or suspicious lesions. Neurologic: Grossly intact, no focal deficits, moving all 4 extremities. Psychiatric: Normal mood and affect. Cardiac: Severe Pedal edema  PTNS Session # monthly  Health & Social Factors: no change Caffeine: 2 Alcohol: 0 Daytime voids #per day: 8 Night-time voids #per night: 2 Urgency: mild Incontinence Episodes #per day: less than 1 Ankle used: left Treatment Setting: 7 Feeling/ Response: sensory Comments: patient tolerated well  Preformed By: Elberta Leatherwood, CMA  Follow Up: see below  Pertinent Imaging: Results for Walker, Anita [facility] (MRN [id number]) as of 08/24/2017 14:21  Ref. Range 08/22/2017 15:37 02/12/2018   Scan Result Unknown 42 76   Assessment & Plan:   1. Urge Incontinence  - Failed anticholinergics and Myrbetriq  - She is finding the PTNS effective and would like to continue the treatments  - Discussed trying Myrbetriq again to alleviate symptoms. Patient is concerned about out of pocket cost and would like to do a trial with samples first. Gave patient Myrbetriq 50 mg one month trial sample pack  - RTC as scheduled for next PTNS - she is moving to monthly maintenance   2. Urinary Frequency  - Recommended patient curb coffee intake as it is a diuretic and could be exacerbating her symptoms. Patient is currently drinking 2 cups of coffee daily, and she is not willing to stop or limit coffee drinking.   - Recommended patient consider BOTOX for OAB and/or bladder pacemaker. Patient expressed interest given she will be able to return to regular functioning following the procedure. Explained intended outcomes and answered all patients questions.  - RTC in 1 month to see Dr. Matilde Sprang to discuss her third line treatment options  No follow-ups on file.  These notes generated with  voice recognition software. I apologize for typographical errors.   Laneta Simmers   [place] [address] [place], [place] [address] [phone]  I, Temidayo Atanda-Ogunleye , am acting as a scribe for Nori Riis, PA-C  I have reviewed the above documentation for accuracy and completeness, and I agree with the above.    Zara Council, PA-C

## 2018-02-12 ENCOUNTER — Ambulatory Visit (INDEPENDENT_AMBULATORY_CARE_PROVIDER_SITE_OTHER): Payer: Medicare Other | Admitting: Urology

## 2018-02-12 VITALS — BP 138/82 | HR 82 | Ht 66.75 in | Wt 243.0 lb

## 2018-02-12 DIAGNOSIS — R351 Nocturia: Secondary | ICD-10-CM

## 2018-02-12 DIAGNOSIS — N3941 Urge incontinence: Secondary | ICD-10-CM | POA: Diagnosis not present

## 2018-02-12 LAB — BLADDER SCAN AMB NON-IMAGING

## 2018-02-12 NOTE — Progress Notes (Signed)
PTNS  Session # monthly  Health & Social Factors: no change Caffeine: 2 Alcohol: 0 Daytime voids #per day: 8 Night-time voids #per night: 2 Urgency: mild Incontinence Episodes #per day: less than 1 Ankle used: left Treatment Setting: 7 Feeling/ Response: sensory Comments: patient tolerated well  Preformed By: Elberta Leatherwood, CMA

## 2018-02-14 ENCOUNTER — Emergency Department
Admission: EM | Admit: 2018-02-14 | Discharge: 2018-02-14 | Disposition: A | Discharge status: Home / Self Care | Payer: Medicare Other | Attending: Emergency Medicine | Admitting: Emergency Medicine

## 2018-02-14 ENCOUNTER — Encounter: Payer: Self-pay | Admitting: Urology

## 2018-02-14 ENCOUNTER — Other Ambulatory Visit: Payer: Self-pay

## 2018-02-14 ENCOUNTER — Encounter: Payer: Self-pay | Admitting: Emergency Medicine

## 2018-02-14 DIAGNOSIS — Z79899 Other long term (current) drug therapy: Secondary | ICD-10-CM | POA: Diagnosis not present

## 2018-02-14 DIAGNOSIS — E1142 Type 2 diabetes mellitus with diabetic polyneuropathy: Secondary | ICD-10-CM | POA: Diagnosis not present

## 2018-02-14 DIAGNOSIS — R300 Dysuria: Secondary | ICD-10-CM | POA: Diagnosis not present

## 2018-02-14 DIAGNOSIS — Z8673 Personal history of transient ischemic attack (TIA), and cerebral infarction without residual deficits: Secondary | ICD-10-CM | POA: Insufficient documentation

## 2018-02-14 DIAGNOSIS — I10 Essential (primary) hypertension: Secondary | ICD-10-CM | POA: Insufficient documentation

## 2018-02-14 LAB — CBC WITH DIFFERENTIAL/PLATELET
Abs Immature Granulocytes: 0.02 10*3/uL (ref 0.00–0.07)
Basophils Absolute: 0.1 10*3/uL (ref 0.0–0.1)
Basophils Relative: 1 %
Eosinophils Absolute: 0.1 10*3/uL (ref 0.0–0.5)
Eosinophils Relative: 1 %
HCT: 39.7 % (ref 36.0–46.0)
Hemoglobin: 13.2 g/dL (ref 12.0–15.0)
Immature Granulocytes: 0 %
LYMPHS PCT: 26 %
Lymphs Abs: 1.7 10*3/uL (ref 0.7–4.0)
MCH: 30.6 pg (ref 26.0–34.0)
MCHC: 33.2 g/dL (ref 30.0–36.0)
MCV: 92.1 fL (ref 80.0–100.0)
Monocytes Absolute: 1 10*3/uL (ref 0.1–1.0)
Monocytes Relative: 16 %
NRBC: 0 % (ref 0.0–0.2)
Neutro Abs: 3.5 10*3/uL (ref 1.7–7.7)
Neutrophils Relative %: 56 %
Platelets: 201 10*3/uL (ref 150–400)
RBC: 4.31 MIL/uL (ref 3.87–5.11)
RDW: 13.5 % (ref 11.5–15.5)
WBC: 6.3 10*3/uL (ref 4.0–10.5)

## 2018-02-14 LAB — URINALYSIS, COMPLETE (UACMP) WITH MICROSCOPIC
BILIRUBIN URINE: NEGATIVE
Bacteria, UA: NONE SEEN
Glucose, UA: NEGATIVE mg/dL
Hgb urine dipstick: NEGATIVE
Ketones, ur: NEGATIVE mg/dL
NITRITE: NEGATIVE
PROTEIN: NEGATIVE mg/dL
Specific Gravity, Urine: 1.005 (ref 1.005–1.030)
Squamous Epithelial / HPF: NONE SEEN (ref 0–5)
pH: 6 (ref 5.0–8.0)

## 2018-02-14 LAB — BASIC METABOLIC PANEL
ANION GAP: 8 (ref 5–15)
BUN: 20 mg/dL (ref 8–23)
CO2: 27 mmol/L (ref 22–32)
Calcium: 9.2 mg/dL (ref 8.9–10.3)
Chloride: 104 mmol/L (ref 98–111)
Creatinine, Ser: 1.01 mg/dL — ABNORMAL HIGH (ref 0.44–1.00)
GFR calc Af Amer: >60 mL/min (ref >60)
GFR calc non Af Amer: 53 mL/min — ABNORMAL LOW (ref >60)
Glucose, Bld: 121 mg/dL — ABNORMAL HIGH (ref 70–99)
Potassium: 4.2 mmol/L (ref 3.5–5.1)
Sodium: 139 mmol/L (ref 135–145)

## 2018-02-14 LAB — PROTIME-INR
INR: 2.01
Prothrombin Time: 22.5 seconds — ABNORMAL HIGH (ref 11.4–15.2)

## 2018-02-14 NOTE — ED Triage Notes (Signed)
Pt states she went to the bathroom at 0530 this am and had some burning with urination, unable to pee at 0600 at all and has been unable to since, denies any pressure or pain, NAD.

## 2018-02-14 NOTE — ED Notes (Signed)
This RN present with MD during pelvic exam. Patient tolerated well. No specimens obtained during exam.

## 2018-02-14 NOTE — ED Notes (Addendum)
Pt reports she has not been to the bathroom since about 0600 this morning, pt reports that she had a scan of her bladder on Tuesday, pt sees urologist regularly for urge incontinence states every 3 weeks. Pt states this morning she noticed some burning with urination when she went to the bathroom and states she had some burning after a bowel movement as well.   Denies any pain currently.  Pt states the only time it stings is when she gets up off the commode.

## 2018-02-14 NOTE — ED Provider Notes (Signed)
[facility] Emergency Department Provider Note  ____________________________________________   First MD Initiated Contact with Patient 02/14/18 [id number]     (approximate)  I have reviewed the triage vital signs and the nursing notes.   HISTORY  Chief Complaint Urinary Tract Infection    HPI Anita Walker is a 77 y.o. female with a history of urge incontinence as well as atrial fibrillation was presented to the emergency department with burning with urination over the past 12 hours.  She says that she also has not urinated this morning after having 2 cups of coffee and says that she always urinates after drinking her morning coffee.  She does not report any blood in her urine.  Denies any abdominal pain, fever or chills.  Patient with between 203 100 cc of urine in the bladder.   Past Medical History:  Diagnosis Date  . Anemia   . Arthritis   . Atrial fibrillation ([place])   . Breast cancer ([place])    s/p lumpectomy 1992.  s/p chemo and xrt left breast  . Diabetes mellitus ([place])   . Edema    feet/legs  . Gastric ulcer   . GERD (gastroesophageal reflux disease)   . HOH (hard of hearing)    aides  . Hypercholesterolemia   . Hypertension   . Pulmonary emboli [facility])     Patient Active Problem List   Diagnosis Date Noted  . Diabetic polyneuropathy associated with type 2 diabetes mellitus ([place]) 03/18/2017  . Abnormal chest CT 03/18/2017  . Urinary frequency 01/21/2017  . Syncope 11/10/2016  . Right hip pain 10/30/2016  . Cough 10/25/2016  . Health care maintenance 05/23/2014  . Mixed emotional features as adjustment reaction 07/20/2013  . Long term current use of anticoagulant therapy 04/11/2013  . Hypertension 01/01/2012  . Hypercholesteremia 01/01/2012  . History of breast cancer 01/01/2012  . Essential hypertension 01/01/2012  . Pure hypercholesterolemia 01/01/2012  . Type 2 diabetes mellitus without complications ([place]) 33/29/5188    Past  Surgical History:  Procedure Laterality Date  . BREAST LUMPECTOMY  03/06/1990   left breast  . CATARACT EXTRACTION W/PHACO Right 12/26/2016   Procedure: CATARACT EXTRACTION PHACO AND INTRAOCULAR LENS PLACEMENT (IOC)-RIGHT DIABETIC;  Surgeon: Birder Robson, MD;  Location: [facility] ORS;  Service: Ophthalmology;  Laterality: Right;  [place] 00:47 AP% 24.5 CDE 11.62 Fluid pack lot # [id number]  . CATARACT EXTRACTION W/PHACO Left 01/23/2017   Procedure: CATARACT EXTRACTION PHACO AND INTRAOCULAR LENS PLACEMENT (IOC);  Surgeon: Birder Robson, MD;  Location: [facility] ORS;  Service: Ophthalmology;  Laterality: Left;  [place] 00:50 AP% 16.1 CDE 8.18 Fluid pack lot #[id number]    Prior to Admission medications   Medication Sig Start Date End Date Taking? Authorizing Provider  CONTOUR TEST test strip USE TO TEST BLOOD SUGAR ONCE EVERY WEEK; IF DOSING HAS CHANGED PLEASE SUBMIT A NEW RX 11/28/17  Yes Einar Pheasant, MD  diphenhydrAMINE (BENADRYL) 25 mg capsule Take 25 mg at bedtime as needed by mouth for sleep.    Yes [provider]  FEROSUL 325 (65 Fe) MG tablet TAKE 1 TABLET BY MOUTH EVERY DAY 11/28/17  Yes Einar Pheasant, MD  losartan (COZAAR) 100 MG tablet TAKE 1 TABLET BY MOUTH EVERY DAY Patient taking differently: Take 100 mg by mouth daily.  07/05/17  Yes Einar Pheasant, MD  mirabegron ER (MYRBETRIQ) 50 MG TB24 tablet Take 50 mg by mouth daily.   Yes [provider]  Multiple Vitamin (MULTIVITAMIN WITH MINERALS) TABS tablet  Take 1 tablet daily by mouth.   Yes [provider]  pantoprazole (PROTONIX) 40 MG tablet Take 1 tablet (40 mg total) by mouth daily. 01/15/18  Yes Einar Pheasant, MD  simvastatin (ZOCOR) 10 MG tablet TAKE ONE (1) TABLET BY MOUTH EVERY DAY Patient taking differently: Take 10 mg by mouth daily at 6 PM. TAKE ONE (1) TABLET BY MOUTH EVERY DAY 01/15/18  Yes Einar Pheasant, MD  warfarin (COUMADIN) 2.5 MG tablet Takes 2 tablets (5 mg) on Tuesdays and Thursdays and  Saturday and 1 tablet (2.5 mg)  all other days of the week 02/04/18  Yes Einar Pheasant, MD  fesoterodine (TOVIAZ) 8 MG TB24 tablet Take 1 tablet (8 mg total) by mouth daily. Patient not taking: Reported on 02/12/2018 01/03/18   Zara Council A, PA-C    Allergies Penicillins and Penicillin v potassium  Family History  Problem Relation Age of Onset  . Heart disease Father   . Heart disease Brother        s/p CABG  . Colon cancer Neg Hx   . Breast cancer Neg Hx     Social History Social History   Tobacco Use  . Smoking status: Never Smoker  . Smokeless tobacco: Never Used  Substance Use Topics  . Alcohol use: No    Alcohol/week: 0.0 standard drinks  . Drug use: No    Review of Systems  Constitutional: No fever/chills Eyes: No visual changes. ENT: No sore throat. Cardiovascular: Denies chest pain. Respiratory: Denies shortness of breath. Gastrointestinal: No abdominal pain.  No nausea, no vomiting.  No diarrhea.  No constipation. Genitourinary: As above Musculoskeletal: Negative for back pain. Skin: Negative for rash. Neurological: Negative for headaches, focal weakness or numbness.   ____________________________________________   PHYSICAL EXAM:  VITAL SIGNS: ED Triage Vitals  Enc Vitals Group     BP 02/14/18 0846 (!) 146/61     Pulse Rate 02/14/18 0846 73     Resp 02/14/18 0846 18     Temp 02/14/18 0846 98.1 F (36.7 C)     Temp Source 02/14/18 0846 Oral     SpO2 02/14/18 0846 99 %     Weight 02/14/18 0847 241 lb (109.3 kg)     Height 02/14/18 0847 5\' 8"  (1.727 m)     Head Circumference --      Peak Flow --      Pain Score 02/14/18 0847 0     Pain Loc --      Pain Edu? --      Excl. in [place]? --     Constitutional: Alert and oriented. Well appearing and in no acute distress. Eyes: Conjunctivae are normal.  Head: Atraumatic. Nose: No congestion/rhinnorhea. Mouth/Throat: Mucous membranes are moist.  Neck: No stridor.   Cardiovascular: Normal rate,  regular rhythm. Grossly normal heart sounds. Respiratory: Normal respiratory effort.  No retractions. Lungs CTAB. Gastrointestinal: Soft and nontender. No distention. Genitourinary: External exam is grossly normal without any lesions, redness or exudate. Musculoskeletal: No lower extremity tenderness nor edema.  No joint effusions. Neurologic:  Normal speech and language. No gross focal neurologic deficits are appreciated. Skin:  Skin is warm, dry and intact. No rash noted. Psychiatric: Mood and affect are normal. Speech and behavior are normal.  ____________________________________________   LABS (all labs ordered are listed, but only abnormal results are displayed)  Labs Reviewed  URINALYSIS, COMPLETE (UACMP) WITH MICROSCOPIC - Abnormal; Notable for the following components:      Result Value   Color,  Urine YELLOW (*)    APPearance CLEAR (*)    Leukocytes, UA TRACE (*)    All other components within normal limits  BASIC METABOLIC PANEL - Abnormal; Notable for the following components:   Glucose, Bld 121 (*)    Creatinine, Ser 1.01 (*)    GFR calc non Af Amer 53 (*)    All other components within normal limits  PROTIME-INR - Abnormal; Notable for the following components:   Prothrombin Time 22.5 (*)    All other components within normal limits  URINE CULTURE  CBC WITH DIFFERENTIAL/PLATELET   ____________________________________________  EKG   ____________________________________________  RADIOLOGY   ____________________________________________   PROCEDURES  Procedure(s) performed:   Procedures  Critical Care performed:   ____________________________________________   INITIAL IMPRESSION / ASSESSMENT AND PLAN / ED COURSE  Pertinent labs & imaging results that were available during my care of the patient were reviewed by me and considered in my medical decision making (see chart for details).  DDX: Urinary retention, UTI, hematuria, kidney failure, Coumadin  coagulopathy As part of my medical decision making, I reviewed the following data within the [facility] Notes from prior outpatient urology visits.  ----------------------------------------- 11:28 AM on 02/14/2018 -----------------------------------------  Patient able to urinate in the "hat" without issue.  Denies any burning at this time.  Reassuring work-up.  Patient to be discharged and will follow-up with her urologist.  Unclear cause of the patient's anuria this morning but the issue appears to have resolved and she is no longer having any pain. ____________________________________________   FINAL CLINICAL IMPRESSION(S) / ED DIAGNOSES  Dysuria.  NEW MEDICATIONS STARTED DURING THIS VISIT:  New Prescriptions   No medications on file     Note:  This document was prepared using Dragon voice recognition software and may include unintentional dictation errors.     Orbie Pyo, MD 02/14/18 [phone]

## 2018-02-14 NOTE — ED Notes (Signed)
Pt assisted to the toilet to see if she can urinate before doing I & O cath.  Pt able to sit on commode and give urine sample.

## 2018-02-15 LAB — URINE CULTURE: Culture: <10000 — AB

## 2018-02-21 ENCOUNTER — Telehealth: Payer: Self-pay | Admitting: Urology

## 2018-02-21 NOTE — Telephone Encounter (Signed)
Pt drinks a lot of water, she goes to the bathroom a lot during the night.  Since starting Myrbetriq 50 mg, she is going to the bathroom a lot more and wet on herself.  She wants to know if she needs to cut back.  [phone] can leave message.

## 2018-02-22 NOTE — Telephone Encounter (Signed)
Spoke with both pt and daughter and informed them of Anita Walker's message. They had no additional questions at this time. Nothing further is needed

## 2018-02-22 NOTE — Telephone Encounter (Signed)
Left vm to call back

## 2018-02-22 NOTE — Telephone Encounter (Signed)
She cannot cut the Myrbetriq tablets in half, so to cut back, she will need to take the medication every other day.

## 2018-02-25 ENCOUNTER — Telehealth: Payer: Self-pay | Admitting: Internal Medicine

## 2018-02-25 ENCOUNTER — Other Ambulatory Visit: Payer: Self-pay

## 2018-02-25 ENCOUNTER — Other Ambulatory Visit (INDEPENDENT_AMBULATORY_CARE_PROVIDER_SITE_OTHER): Payer: Medicare Other

## 2018-02-25 DIAGNOSIS — Z7901 Long term (current) use of anticoagulants: Secondary | ICD-10-CM | POA: Diagnosis not present

## 2018-02-25 LAB — PROTIME-INR
INR: 2 ratio — ABNORMAL HIGH (ref 0.8–1.0)
Prothrombin Time: 22.7 s — ABNORMAL HIGH (ref 9.6–13.1)

## 2018-02-25 MED ORDER — FERROUS SULFATE 325 (65 FE) MG PO TABS: 325.0000 mg | ORAL_TABLET | Freq: Every day | ORAL | 0 refills | Status: DC

## 2018-02-25 NOTE — Telephone Encounter (Signed)
I have sent.

## 2018-02-25 NOTE — Telephone Encounter (Signed)
Pt dropped off parking placard form to be filled out. Placed in Dr. Bary Leriche colored folder upfront  Please mail to pt when completed

## 2018-02-25 NOTE — Telephone Encounter (Signed)
Pt also needs her iron pills refilled

## 2018-03-05 ENCOUNTER — Ambulatory Visit: Payer: Medicare Other

## 2018-03-07 ENCOUNTER — Ambulatory Visit (INDEPENDENT_AMBULATORY_CARE_PROVIDER_SITE_OTHER): Payer: Medicare Other

## 2018-03-07 ENCOUNTER — Encounter: Payer: Self-pay | Admitting: Urology

## 2018-03-07 VITALS — BP 130/82 | HR 82 | Ht 67.0 in | Wt 241.0 lb

## 2018-03-07 DIAGNOSIS — R350 Frequency of micturition: Secondary | ICD-10-CM

## 2018-03-07 NOTE — Progress Notes (Signed)
PTNS  Session # maintenance   Health & Social Factors: No change Caffeine: 2 Alcohol: 0 Daytime voids #per day: 8 Night-time voids #per night: 2 Urgency: mild Incontinence Episodes #per day: 23 in the last month Ankle used: right Treatment Setting: 10 Feeling/ Response: sensory Comments: patient tolerated well  Preformed By: Fonnie Jarvis    Follow Up: with Dr MacDirmoid to discuss other options

## 2018-03-13 ENCOUNTER — Ambulatory Visit (INDEPENDENT_AMBULATORY_CARE_PROVIDER_SITE_OTHER): Payer: Medicare Other

## 2018-03-13 VITALS — BP 122/70 | HR 70 | Temp 97.4°F | Resp 16 | Ht 67.0 in | Wt 243.4 lb

## 2018-03-13 DIAGNOSIS — Z0000 Encounter for general adult medical examination without abnormal findings: Secondary | ICD-10-CM

## 2018-03-13 NOTE — Progress Notes (Signed)
Agree f/u with PCP   TMS 

## 2018-03-13 NOTE — Patient Instructions (Addendum)
  Anita Walker , Thank you for taking time to come for your Medicare Wellness Visit. I appreciate your ongoing commitment to your health goals. Please review the following plan we discussed and let me know if I can assist you in the future.   These are the goals we discussed: Goals    . Maintain Healthy Lifetsyle     Decrease caffeine intake as directed Healthy diet Stay active       This is a list of the screening recommended for you and due dates:  Health Maintenance  Topic Date Due  . Tetanus Vaccine  03/21/1959  . Hemoglobin A1C  05/15/2018  . Eye exam for diabetics  09/13/2018  . Complete foot exam   09/15/2018  . Mammogram  12/01/2018  . Flu Shot  Completed  . DEXA scan (bone density measurement)  Completed  . Pneumonia vaccines  Completed

## 2018-03-13 NOTE — Progress Notes (Signed)
Subjective:   Anita Walker is a 78 y.o. female who presents for Medicare Annual (Subsequent) preventive examination.  Review of Systems:  No ROS.  Medicare Wellness Visit. Additional risk factors are reflected in the social history. Cardiac Risk Factors include: advanced age (>4men, >42 women);hypertension;diabetes mellitus     Objective:     Vitals: BP 122/70 (BP Location: Right Arm, Patient Position: Sitting, Cuff Size: Large)   Pulse 70   Temp (!) 97.4 F (36.3 C) (Oral)   Resp 16   Ht 5\' 7"  (1.702 m)   Wt 243 lb 6.4 oz (110.4 kg)   SpO2 97%   BMI 38.12 kg/m   Body mass index is 38.12 kg/m.  Advanced Directives 03/13/2018 02/14/2018 03/02/2017 01/23/2017 01/23/2017 12/26/2016 11/02/2016  Does Patient Have a Medical Advance Directive? Yes Yes Yes - Yes Yes Yes  Type of Advance Directive [place];Living will [place];Living will [place];Living will - [place];Living will Living will [place];Living will  Does patient want to make changes to medical advance directive? No - Patient declined - No - Patient declined - - No - Patient declined -  Copy of [place] in Chart? Yes - validated most recent copy scanned in chart (See row information) No - copy requested No - copy requested Yes - - No - copy requested  Would patient like information on creating a medical advance directive? - - - - - - No - Patient declined    Tobacco Social History   Tobacco Use  Smoking Status Never Smoker  Smokeless Tobacco Never Used     Counseling given: Not Answered   Clinical Intake:  Pre-visit preparation completed: Yes  Pain : No/denies pain     Diabetes: Yes(Followed by pcp)  How often do you need to have someone help you when you read instructions, pamphlets, or other written materials from your doctor or pharmacy?: 3 - Sometimes  Interpreter Needed?:  No     Past Medical History:  Diagnosis Date  . Anemia   . Arthritis   . Atrial fibrillation ([place])   . Breast cancer ([place])    s/p lumpectomy 1992.  s/p chemo and xrt left breast  . Diabetes mellitus ([place])   . Edema    feet/legs  . Gastric ulcer   . GERD (gastroesophageal reflux disease)   . HOH (hard of hearing)    aides  . Hypercholesterolemia   . Hypertension   . Pulmonary emboli [facility])    Past Surgical History:  Procedure Laterality Date  . BREAST LUMPECTOMY  03/06/1990   left breast  . CATARACT EXTRACTION W/PHACO Right 12/26/2016   Procedure: CATARACT EXTRACTION PHACO AND INTRAOCULAR LENS PLACEMENT (IOC)-RIGHT DIABETIC;  Surgeon: Birder Robson, MD;  Location: [facility] ORS;  Service: Ophthalmology;  Laterality: Right;  [place] 00:47 AP% 24.5 CDE 11.62 Fluid pack lot # [id number]  . CATARACT EXTRACTION W/PHACO Left 01/23/2017   Procedure: CATARACT EXTRACTION PHACO AND INTRAOCULAR LENS PLACEMENT (IOC);  Surgeon: Birder Robson, MD;  Location: [facility] ORS;  Service: Ophthalmology;  Laterality: Left;  [place] 00:50 AP% 16.1 CDE 8.18 Fluid pack lot #[id number]   Family History  Problem Relation Age of Onset  . Heart disease Father   . Heart disease Brother        s/p CABG  . Colon cancer Neg Hx   . Breast cancer Neg Hx    Social History   Socioeconomic History  .  Marital status: Widowed    Spouse name: Not on file  . Number of children: Not on file  . Years of education: Not on file  . Highest education level: Not on file  Occupational History  . Not on file  Social Needs  . Financial resource strain: Not hard at all  . Food insecurity:    Worry: Never true    Inability: Never true  . Transportation needs:    Medical: No    Non-medical: No  Tobacco Use  . Smoking status: Never Smoker  . Smokeless tobacco: Never Used  Substance and Sexual Activity  . Alcohol use: No    Alcohol/week: 0.0 standard drinks  . Drug use: No  . Sexual activity: Never  Lifestyle  .  Physical activity:    Days per week: 7 days    Minutes per session: 20 min  . Stress: Not at all  Relationships  . Social connections:    Talks on phone: Not on file    Gets together: Not on file    Attends religious service: Not on file    Active member of club or organization: Not on file    Attends meetings of clubs or organizations: Not on file    Relationship status: Not on file  Other Topics Concern  . Not on file  Social History Narrative  . Not on file    Outpatient Encounter Medications as of 03/13/2018  Medication Sig  . CONTOUR TEST test strip USE TO TEST BLOOD SUGAR ONCE EVERY WEEK; IF DOSING HAS CHANGED PLEASE SUBMIT A NEW RX  . diphenhydrAMINE (BENADRYL) 25 mg capsule Take 25 mg at bedtime as needed by mouth for sleep.   . ferrous sulfate (FEROSUL) 325 (65 FE) MG tablet Take 1 tablet (325 mg total) by mouth daily.  Marland Kitchen losartan (COZAAR) 100 MG tablet TAKE 1 TABLET BY MOUTH EVERY DAY (Patient taking differently: Take 100 mg by mouth daily. )  . Multiple Vitamin (MULTIVITAMIN WITH MINERALS) TABS tablet Take 1 tablet daily by mouth.  . pantoprazole (PROTONIX) 40 MG tablet Take 1 tablet (40 mg total) by mouth daily.  . simvastatin (ZOCOR) 10 MG tablet TAKE ONE (1) TABLET BY MOUTH EVERY DAY (Patient taking differently: Take 10 mg by mouth daily at 6 PM. TAKE ONE (1) TABLET BY MOUTH EVERY DAY)  . warfarin (COUMADIN) 2.5 MG tablet Takes 2 tablets (5 mg) on Tuesdays and Thursdays and Saturday and 1 tablet (2.5 mg)  all other days of the week  . fesoterodine (TOVIAZ) 8 MG TB24 tablet Take 1 tablet (8 mg total) by mouth daily. (Patient not taking: Reported on 03/13/2018)  . mirabegron ER (MYRBETRIQ) 50 MG TB24 tablet Take 50 mg by mouth daily.   No facility-administered encounter medications on file as of 03/13/2018.     Activities of Daily Living In your present state of health, do you have any difficulty performing the following activities: 03/13/2018  Hearing? Y  Comment Hearing  aids  Vision? N  Difficulty concentrating or making decisions? N  Walking or climbing stairs? Y  Comment Unsteady gait. Cane in use.   Dressing or bathing? N  Doing errands, shopping? N  Preparing Food and eating ? Y  Comment She does very little cooking. Self feeds. Makes sandwiches.   Using the Toilet? N  In the past six months, have you accidently leaked urine? Y  Comment Followed by pcp and Urology.  Do you have problems with loss of bowel control? N  Managing your Medications? N  Managing your Finances? Y  Comment Daughter manages.  Housekeeping or managing your Housekeeping? N  Some recent data might be hidden    Patient Care Team: Einar Pheasant, MD as PCP - General (Internal Medicine)    Assessment:   This is a routine wellness examination for Kamilya Wakeman.  Reports she is still being seen by Urology. She believes her urinary frequency is worsening. Denies pain, bleeding.  Wears daily brief. Not taking Myrbetriq due to seeing no change, not taking Toviaz due to constipation. Notes she is drinking plenty of fluids and staying active with walking and using her stationary bike daily. Patient states, "despite previous recommendation from physician to stop drinking caffeine, I still drink 2 cups of coffee daily." Declines follow up with pcp for any issue and agrees to keep all routine maintenance appointments.    Monitors blood sugars, averaging 113.   Health Screenings  Mammogram-11/30/2017 Colonoscopy-02/03/2009 Bone Density-04/19/2016 Glaucoma-none reported Hearing-hearting aids Dental-UTD. Scheduled 04/04/2018 Vision-annual visits  Social  Alcohol intake-no Smoking history- none Smokers in home? none Illicit drug use? none Exercise-walking, stationary bike 7 days weekly, 20 minutes Diet-regular Sexually [place]  Patient feels safe at home. Patient does have smoke detectors at home  Patient does wear sunscreen or protective clothing when in direct  sunlight  Patient does wear seat belt when driving or riding with others.   Activities of Daily Living Patient can do their own household chores. Denies needing assistance with: driving, feeding themselves, getting from bed to chair, getting to the toilet, bathing/showering, dressing, managing money, climbing flight of stairs, or preparing meals.   Depression Screen Patient denies losing interest in daily life, feeling hopeless, or crying easily over simple problems.   Fall Screen Patient denies being afraid of falling or falling in the last year.   Memory Screen Patient denies problems with memory, misplacing items.   Daughter balances checkbook/bank accounts.  Patient is alert, normal appearance, oriented to person/place/and time. Correctly identified the president of the [place], recall of 2/3 objects.  Patient displays appropriate judgement and can read correct time from watch face.   Immunizations The following Immunizations are up to date: Influenza, shingles and pneumonia. Tetanus discussed.    Other Providers Patient Care Team: Einar Pheasant, MD as PCP - General (Internal Medicine)  Exercise Activities and Dietary recommendations Current Exercise Habits: The patient does not participate in regular exercise at present, Type of exercise: calisthenics;walking, Time (Minutes): 20, Frequency (Times/Week): 7, Weekly Exercise (Minutes/Week): 140, Intensity: Mild  Goals    . Maintain Healthy Lifetsyle     Decrease caffeine intake as directed Healthy diet Stay active       Fall Risk Fall Risk  03/13/2018 03/02/2017 02/11/2016 12/24/2015 09/16/2014  Falls in the past year? 0 No No No No   Depression Screen PHQ 2/9 Scores 03/13/2018 03/02/2017 09/08/2016 02/11/2016  PHQ - 2 Score 0 0 0 0  PHQ- 9 Score - 0 0 -     Cognitive Function MMSE - Mini Mental State Exam 02/11/2016  Not completed: Refused     6CIT Screen 03/13/2018 03/02/2017 02/11/2016  What Year? 0 points 0 points 0  points  What month? 0 points 0 points 0 points  What time? 0 points 0 points 0 points  Count back from 20 0 points 0 points (No Data)  Months in reverse 4 points 0 points (No Data)  Repeat phrase - 0 points 2 points  Total Score - 0 -  Immunization History  Administered Date(s) Administered  . Influenza Split 11/19/2011, 12/09/2012, 12/27/2017  . Influenza, High Dose Seasonal PF 12/02/2015, 11/07/2016, 11/14/2017  . Influenza,inj,Quad PF,6+ Mos 12/03/2013, 11/12/2014  . Influenza-Unspecified 12/12/2011, 12/11/2012, 12/03/2013, 11/12/2014, 12/02/2015  . Pneumococcal Conjugate-13 01/14/2014  . Pneumococcal Polysaccharide-23 02/11/2016  . Zoster Recombinat (Shingrix) 09/15/2017, 11/15/2017   Screening Tests Health Maintenance  Topic Date Due  . TETANUS/TDAP  03[birth year]21/1957  . HEMOGLOBIN A1C  05/15/2018  . OPHTHALMOLOGY EXAM  09/13/2018  . FOOT EXAM  09/15/2018  . MAMMOGRAM  12/01/2018  . INFLUENZA VACCINE  Completed  . DEXA SCAN  Completed  . PNA vac Low Risk Adult  Completed      Plan:    End of life planning; Advance aging; Advanced directives discussed. Copy of current HCPOA/Living Will on file.  I have personally reviewed and noted the following in the patient's chart:   . Medical and social history . Use of alcohol, tobacco or illicit drugs  . Current medications and supplements . Functional ability and status . Nutritional status . Physical activity . Advanced directives . List of other physicians . Hospitalizations, surgeries, and ER visits in previous 12 months . Vitals . Screenings to include cognitive, depression, and falls . Referrals and appointments  In addition, I have reviewed and discussed with patient certain preventive protocols, quality metrics, and best practice recommendations. A written personalized care plan for preventive services as well as general preventive health recommendations were provided to patient.     Varney Biles,  LPN  [id number]

## 2018-03-19 ENCOUNTER — Other Ambulatory Visit (INDEPENDENT_AMBULATORY_CARE_PROVIDER_SITE_OTHER): Payer: Medicare Other

## 2018-03-19 DIAGNOSIS — Z7901 Long term (current) use of anticoagulants: Secondary | ICD-10-CM | POA: Diagnosis not present

## 2018-03-19 LAB — PROTIME-INR
INR: 1.7 ratio — ABNORMAL HIGH (ref 0.8–1.0)
PROTHROMBIN TIME: 20.1 s — AB (ref 9.6–13.1)

## 2018-03-20 ENCOUNTER — Other Ambulatory Visit: Payer: Self-pay

## 2018-03-20 MED ORDER — WARFARIN SODIUM 2.5 MG PO TABS: ORAL_TABLET | ORAL | 2 refills | Status: DC

## 2018-03-28 ENCOUNTER — Ambulatory Visit (INDEPENDENT_AMBULATORY_CARE_PROVIDER_SITE_OTHER): Payer: Medicare Other

## 2018-03-28 DIAGNOSIS — N3946 Mixed incontinence: Secondary | ICD-10-CM

## 2018-03-28 DIAGNOSIS — N3941 Urge incontinence: Secondary | ICD-10-CM | POA: Diagnosis not present

## 2018-03-28 NOTE — Progress Notes (Signed)
PTNS  Session # Maintenance  Health & Social Factors: no change Caffeine: 2 Alcohol: 0 Daytime voids #per day: 8 Night-time voids #per night: 2 Urgency: moderate Incontinence Episodes #per day: 30 Ankle used: right Treatment Setting: 3 Feeling/ Response: movement Comments: patient tender to touch but tolerated well  Preformed By: Elizabeth Palau, CMA(AAMA)    Follow Up: as directed

## 2018-03-29 ENCOUNTER — Other Ambulatory Visit (INDEPENDENT_AMBULATORY_CARE_PROVIDER_SITE_OTHER): Payer: Medicare Other

## 2018-03-29 DIAGNOSIS — Z7901 Long term (current) use of anticoagulants: Secondary | ICD-10-CM

## 2018-03-29 LAB — PROTIME-INR
INR: 2.4 ratio — ABNORMAL HIGH (ref 0.8–1.0)
Prothrombin Time: 27.2 s — ABNORMAL HIGH (ref 9.6–13.1)

## 2018-04-01 ENCOUNTER — Ambulatory Visit (INDEPENDENT_AMBULATORY_CARE_PROVIDER_SITE_OTHER): Payer: Medicare Other | Admitting: Urology

## 2018-04-01 ENCOUNTER — Encounter: Payer: Self-pay | Admitting: Urology

## 2018-04-01 VITALS — BP 146/72 | HR 77 | Ht 67.0 in | Wt 241.0 lb

## 2018-04-01 DIAGNOSIS — N3946 Mixed incontinence: Secondary | ICD-10-CM | POA: Diagnosis not present

## 2018-04-01 NOTE — Progress Notes (Signed)
04/01/2018 2:18 PM   Anita Walker 1941-01-15 [id number]  Referring provider: Einar Pheasant, [address] [name], [name] [address]  Chief Complaint  Patient presents with  . mixed incontinence    HPI: I was consulted to assess the patient's urgency incontinence which primarily occurs with foot on the floor syndrome in the middle of the night. Less frequently during the day she can have urgency incontinence and denies stress incontinence. At night she wears 1 pad that is moderately wet  She voids every 2 hours during the day but sometimes every 15-30 minutes after fluids. She gets up 3 times at night  pelvic examination was limited by her immobility. She had a grade 2 cystocele that almost reached the introitus. She had no stress incontinence  The patient primarily has urgency incontinence with mild frequency and nocturia. She likely has a component of a nocturnal diuresis. If overactive bladder medications did not reach her treatment goal desmopressin would also be an option. I may need to cystoscope her in the future but she had no blood in the urine today and I sent it for culture  The patient has failed Toviaz oxybutynin and Myrbetriq.  I talked about Botox and percutaneous tibial nerve stimulation in detail.  She uses a walker and she 75.  She does have some obesity.  I did not bring up InterStim   Today She is doing percutaneous tibial nerve stimulation and I think taking Myrbetriq every second day By history the patient appears dry during the day but will have foot on the floor syndrome twice at night.  She thinks the percutaneous tibial nerve stimulation worked better when it was weekly but overall is improved at 3 weeks.  She stopped the Myrbetriq and thought it caused constipation.      PMH: Past Medical History:  Diagnosis Date  . Anemia   . Arthritis   . Atrial fibrillation ([place])   . Breast cancer ([place])    s/p  lumpectomy 1992.  s/p chemo and xrt left breast  . Diabetes mellitus ([place])   . Edema    feet/legs  . Gastric ulcer   . GERD (gastroesophageal reflux disease)   . HOH (hard of hearing)    aides  . Hypercholesterolemia   . Hypertension   . Pulmonary emboli [facility])     Surgical History: Past Surgical History:  Procedure Laterality Date  . BREAST LUMPECTOMY  03/06/1990   left breast  . CATARACT EXTRACTION W/PHACO Right 12/26/2016   Procedure: CATARACT EXTRACTION PHACO AND INTRAOCULAR LENS PLACEMENT (IOC)-RIGHT DIABETIC;  Surgeon: Birder Robson, MD;  Location: [facility] ORS;  Service: Ophthalmology;  Laterality: Right;  [place] 00:47 AP% 24.5 CDE 11.62 Fluid pack lot # [id number]  . CATARACT EXTRACTION W/PHACO Left 01/23/2017   Procedure: CATARACT EXTRACTION PHACO AND INTRAOCULAR LENS PLACEMENT (IOC);  Surgeon: Birder Robson, MD;  Location: [facility] ORS;  Service: Ophthalmology;  Laterality: Left;  [place] 00:50 AP% 16.1 CDE 8.18 Fluid pack lot #[id number]    Home Medications:  Allergies as of 04/01/2018      Reactions   Penicillins Other (See Comments)   Unknown- pt states been a long time ago  Has patient had a PCN reaction causing immediate rash, facial/tongue/throat swelling, SOB or lightheadedness with hypotension: Unknown Has patient had a PCN reaction causing severe rash involving mucus membranes or skin necrosis: Unknown Has patient had a PCN reaction that required hospitalization: Unknown Has patient had a PCN reaction occurring within the last  10 years: Unknown If all of the above answers are "NO", then may proceed with Cephalosporin use.   Penicillin V Potassium Nausea And Vomiting      Medication List       Accurate as of April 01, 2018  2:18 PM. Always use your most recent med list.        CONTOUR TEST test strip Generic drug:  glucose blood USE TO TEST BLOOD SUGAR ONCE EVERY WEEK; IF DOSING HAS CHANGED PLEASE SUBMIT A NEW RX   diphenhydrAMINE 25 mg capsule Commonly known  as:  BENADRYL Take 25 mg at bedtime as needed by mouth for sleep.   ferrous sulfate 325 (65 FE) MG tablet Commonly known as:  FEROSUL Take 1 tablet (325 mg total) by mouth daily.   fesoterodine 8 MG Tb24 tablet Commonly known as:  TOVIAZ Take 1 tablet (8 mg total) by mouth daily.   losartan 100 MG tablet Commonly known as:  COZAAR TAKE 1 TABLET BY MOUTH EVERY DAY   multivitamin with minerals Tabs tablet Take 1 tablet daily by mouth.   MYRBETRIQ 50 MG Tb24 tablet Generic drug:  mirabegron ER Take 50 mg by mouth daily.   pantoprazole 40 MG tablet Commonly known as:  PROTONIX Take 1 tablet (40 mg total) by mouth daily.   simvastatin 10 MG tablet Commonly known as:  ZOCOR TAKE ONE (1) TABLET BY MOUTH EVERY DAY   warfarin 2.5 MG tablet Commonly known as:  COUMADIN Takes 2 tablets (5 mg) on Tuesdays and Thursdays and Saturday and Sunday and 1 tablet (2.5 mg)  all other days of the week       Allergies:  Allergies  Allergen Reactions  . Penicillins Other (See Comments)    Unknown- pt states been a long time ago  Has patient had a PCN reaction causing immediate rash, facial/tongue/throat swelling, SOB or lightheadedness with hypotension: Unknown Has patient had a PCN reaction causing severe rash involving mucus membranes or skin necrosis: Unknown Has patient had a PCN reaction that required hospitalization: Unknown Has patient had a PCN reaction occurring within the last 10 years: Unknown If all of the above answers are "NO", then may proceed with Cephalosporin use.  Marland Kitchen Penicillin V Potassium Nausea And Vomiting    Family History: Family History  Problem Relation Age of Onset  . Heart disease Father   . Heart disease Brother        s/p CABG  . Colon cancer Neg Hx   . Breast cancer Neg Hx     Social History:  reports that she has never smoked. She has never used smokeless tobacco. She reports that she does not drink alcohol or use drugs.  ROS: UROLOGY Frequent  Urination?: Yes Hard to postpone urination?: No Burning/pain with urination?: No Get up at night to urinate?: Yes Leakage of urine?: Yes Urine stream starts and stops?: No Trouble starting stream?: No Do you have to strain to urinate?: No Blood in urine?: No Urinary tract infection?: No Sexually transmitted disease?: No Injury to kidneys or bladder?: No Painful intercourse?: No Weak stream?: No Currently pregnant?: No Vaginal bleeding?: No Last menstrual period?: n  Gastrointestinal Nausea?: No Vomiting?: No Indigestion/heartburn?: No Diarrhea?: No Constipation?: No  Constitutional Fever: No Night sweats?: No Weight loss?: No Fatigue?: No  Skin Skin rash/lesions?: No Itching?: No  Eyes Blurred vision?: No Double vision?: No  Ears/Nose/Throat Sore throat?: No Sinus problems?: No  Hematologic/Lymphatic Swollen glands?: No Easy bruising?: Yes  Cardiovascular Leg swelling?:  No Chest pain?: No  Respiratory Cough?: No Shortness of breath?: No  Endocrine Excessive thirst?: No  Musculoskeletal Back pain?: No Joint pain?: No  Neurological Headaches?: No Dizziness?: No  Psychologic Depression?: No Anxiety?: No  Physical Exam: BP (!) 146/72 (BP Location: Left Arm, Patient Position: Sitting)   Pulse 77   Ht 5\' 7"  (1.702 m)   Wt 241 lb (109.3 kg)   BMI 37.75 kg/m   Constitutional:  Alert and oriented, No acute distress. HEENT: [place] AT, moist mucus membranes.  Trachea midline, no masses.  Laboratory Data: Lab Results  Component Value Date   WBC 6.3 02/14/2018   HGB 13.2 02/14/2018   HCT 39.7 02/14/2018   MCV 92.1 02/14/2018   PLT 201 02/14/2018    Lab Results  Component Value Date   CREATININE 1.01 (H) 02/14/2018    No results found for: PSA  No results found for: TESTOSTERONE  Lab Results  Component Value Date   HGBA1C 6.1 11/14/2017    Urinalysis    Component Value Date/Time   COLORURINE YELLOW (A) 02/14/2018 1008    APPEARANCEUR CLEAR (A) 02/14/2018 1008   APPEARANCEUR Clear 01/29/2017 1403   LABSPEC 1.005 02/14/2018 1008   LABSPEC 1.008 07/16/2013 1405   PHURINE 6.0 02/14/2018 1008   GLUCOSEU NEGATIVE 02/14/2018 1008   GLUCOSEU Negative 07/16/2013 1405   HGBUR NEGATIVE 02/14/2018 1008   BILIRUBINUR NEGATIVE 02/14/2018 1008   BILIRUBINUR Negative 01/29/2017 1403   BILIRUBINUR Negative 07/16/2013 1405   KETONESUR NEGATIVE 02/14/2018 1008   PROTEINUR NEGATIVE 02/14/2018 1008   UROBILINOGEN 0.2 07/18/2013 0948   NITRITE NEGATIVE 02/14/2018 1008   LEUKOCYTESUR TRACE (A) 02/14/2018 1008   LEUKOCYTESUR 1+ (A) 01/29/2017 1403   LEUKOCYTESUR 3+ 07/16/2013 1405    Pertinent Imaging:   Assessment & Plan: The patient will continue with percutaneous tibial nerve stimulation.  I think we have to have reasonable treatment goals.  I went through Botox again.  I do not think she should have InterStim we talked about this.  She would also stop her Coumadin.  I think we need to have reasonable goals.  The patient will call if they would like to proceed with Botox and would have to stop the Coumadin.  I think the patient is a bit frustrated but unfortunate I do not have other good options for her.  There are no diagnoses linked to this encounter.  Return for Continue percutaneous tibial nerve stimulation.  Reece Packer, MD  [facility] [address] [place], [place] [address] [phone]

## 2018-04-03 DIAGNOSIS — E119 Type 2 diabetes mellitus without complications: Secondary | ICD-10-CM | POA: Diagnosis not present

## 2018-04-05 ENCOUNTER — Other Ambulatory Visit (INDEPENDENT_AMBULATORY_CARE_PROVIDER_SITE_OTHER): Payer: Medicare Other

## 2018-04-05 DIAGNOSIS — Z7901 Long term (current) use of anticoagulants: Secondary | ICD-10-CM | POA: Diagnosis not present

## 2018-04-05 LAB — PROTIME-INR
INR: 2.6 ratio — ABNORMAL HIGH (ref 0.8–1.0)
Prothrombin Time: 29.7 s — ABNORMAL HIGH (ref 9.6–13.1)

## 2018-04-15 ENCOUNTER — Ambulatory Visit (INDEPENDENT_AMBULATORY_CARE_PROVIDER_SITE_OTHER): Payer: Medicare Other | Admitting: Podiatry

## 2018-04-15 ENCOUNTER — Encounter: Payer: Self-pay | Admitting: Podiatry

## 2018-04-15 DIAGNOSIS — D689 Coagulation defect, unspecified: Secondary | ICD-10-CM

## 2018-04-15 DIAGNOSIS — M79676 Pain in unspecified toe(s): Secondary | ICD-10-CM | POA: Diagnosis not present

## 2018-04-15 DIAGNOSIS — B351 Tinea unguium: Secondary | ICD-10-CM

## 2018-04-15 DIAGNOSIS — E1142 Type 2 diabetes mellitus with diabetic polyneuropathy: Secondary | ICD-10-CM | POA: Diagnosis not present

## 2018-04-15 DIAGNOSIS — M79609 Pain in unspecified limb: Principal | ICD-10-CM

## 2018-04-15 NOTE — Progress Notes (Signed)
Complaint:  Visit Type: Patient returns to my office for continued preventative foot care services. Complaint: Patient states" my nails have grown long and thick and become painful to walk and wear shoes" Patient has been diagnosed with DM with neuropathy. The patient presents for preventative foot care services. No changes to ROS.  Patient is taking coumadin.  Podiatric Exam: Vascular: dorsalis pedis  are palpable bilateral. Posterior tibial pulses are non palpable due to ankle/leg swelling. Capillary return is immediate. Temperature gradient is WNL. Skin turgor WNL  Sensorium: Diminished  Semmes Weinstein monofilament test. Normal tactile sensation bilaterally. Nail Exam: Pt has thick disfigured discolored nails with subungual debris noted bilateral entire nail hallux through fifth toenails.  Pincer hallux nails. Ulcer Exam: There is no evidence of ulcer or pre-ulcerative changes or infection. Orthopedic Exam: Muscle tone and strength are WNL. No limitations in general ROM. No crepitus or effusions noted. Foot type and digits show no abnormalities. Bony prominences are unremarkable. Skin: No Porokeratosis. No infection or ulcers  Diagnosis:  Onychomycosis, , Pain in right toe, pain in left toes  Treatment & Plan Procedures and Treatment: Consent by patient was obtained for treatment procedures. The patient understood the discussion of treatment and procedures well. All questions were answered thoroughly reviewed. Debridement of mycotic and hypertrophic toenails, 1 through 5 bilateral and clearing of subungual debris. No ulceration, no infection noted.  Return Visit-Office Procedure: Patient instructed to return to the office for a follow up visit 10 weeks  for continued evaluation and treatment.    Kitiara Hintze DPM 

## 2018-04-17 ENCOUNTER — Other Ambulatory Visit (INDEPENDENT_AMBULATORY_CARE_PROVIDER_SITE_OTHER): Payer: Medicare Other

## 2018-04-17 DIAGNOSIS — Z7901 Long term (current) use of anticoagulants: Secondary | ICD-10-CM

## 2018-04-17 LAB — PROTIME-INR
INR: 2.7 ratio — ABNORMAL HIGH (ref 0.8–1.0)
Prothrombin Time: 31.2 s — ABNORMAL HIGH (ref 9.6–13.1)

## 2018-04-18 ENCOUNTER — Telehealth: Payer: Self-pay | Admitting: Internal Medicine

## 2018-04-18 NOTE — Telephone Encounter (Signed)
Lab results given and documented in result note 

## 2018-04-18 NOTE — Telephone Encounter (Signed)
Copied from [place] [id number]. Topic: Quick Communication - Lab Results (Clinic Use ONLY) >> Apr 18, 2018 11:32 AM Gordy Councilman, CMA wrote: Called patient to inform them of 04/17/2018 lab results. When patient returns call, triage nurse may disclose results. Gae Bon, CMA    Pt calling back for results.

## 2018-04-19 ENCOUNTER — Ambulatory Visit (INDEPENDENT_AMBULATORY_CARE_PROVIDER_SITE_OTHER): Payer: Medicare Other | Admitting: Family Medicine

## 2018-04-19 DIAGNOSIS — N3946 Mixed incontinence: Secondary | ICD-10-CM

## 2018-04-19 NOTE — Progress Notes (Signed)
PTNS  Session # Monthly  Health & Social Factors: no change Caffeine: 2 Alcohol: 0 Daytime voids #per day: 5 Night-time voids #per night: 2 Urgency: mild Incontinence Episodes #per day: 1 Ankle used: right Treatment Setting: 19 Feeling/ Response: Sensory Comments: Patient tolerated well  Preformed By: Elberta Leatherwood, CMA   Follow Up: 1 month

## 2018-05-07 ENCOUNTER — Other Ambulatory Visit: Payer: Self-pay | Admitting: Radiology

## 2018-05-07 DIAGNOSIS — Z7901 Long term (current) use of anticoagulants: Principal | ICD-10-CM

## 2018-05-07 DIAGNOSIS — Z5181 Encounter for therapeutic drug level monitoring: Secondary | ICD-10-CM

## 2018-05-09 ENCOUNTER — Other Ambulatory Visit (INDEPENDENT_AMBULATORY_CARE_PROVIDER_SITE_OTHER): Payer: Medicare Other

## 2018-05-09 DIAGNOSIS — Z5181 Encounter for therapeutic drug level monitoring: Secondary | ICD-10-CM

## 2018-05-09 DIAGNOSIS — Z7901 Long term (current) use of anticoagulants: Secondary | ICD-10-CM | POA: Diagnosis not present

## 2018-05-09 LAB — PROTIME-INR
INR: 2.5 ratio — ABNORMAL HIGH (ref 0.8–1.0)
Prothrombin Time: 28.5 s — ABNORMAL HIGH (ref 9.6–13.1)

## 2018-05-10 ENCOUNTER — Ambulatory Visit (INDEPENDENT_AMBULATORY_CARE_PROVIDER_SITE_OTHER): Payer: Medicare Other

## 2018-05-10 DIAGNOSIS — N3946 Mixed incontinence: Secondary | ICD-10-CM | POA: Diagnosis not present

## 2018-05-10 NOTE — Progress Notes (Signed)
PTNS  Session # maintenance  Health & Social Factors: no change Caffeine: 2 Alcohol: 0 Daytime voids #per day: 8 Night-time voids #per night: 2 Urgency: mild Incontinence Episodes #per day: 1-2 Ankle used: right Treatment Setting: 18 Feeling/ Response: sensory Comments: patient tolerated well  Preformed By: Shawnie Dapper, CMA  Follow Up: 3 weeks

## 2018-05-14 DIAGNOSIS — L578 Other skin changes due to chronic exposure to nonionizing radiation: Secondary | ICD-10-CM | POA: Diagnosis not present

## 2018-05-14 DIAGNOSIS — Z872 Personal history of diseases of the skin and subcutaneous tissue: Secondary | ICD-10-CM | POA: Diagnosis not present

## 2018-05-14 DIAGNOSIS — Z86018 Personal history of other benign neoplasm: Secondary | ICD-10-CM | POA: Diagnosis not present

## 2018-05-14 DIAGNOSIS — L570 Actinic keratosis: Secondary | ICD-10-CM | POA: Diagnosis not present

## 2018-05-14 DIAGNOSIS — Z85828 Personal history of other malignant neoplasm of skin: Secondary | ICD-10-CM | POA: Diagnosis not present

## 2018-05-14 DIAGNOSIS — Z859 Personal history of malignant neoplasm, unspecified: Secondary | ICD-10-CM | POA: Diagnosis not present

## 2018-05-23 ENCOUNTER — Other Ambulatory Visit: Payer: Self-pay

## 2018-05-23 ENCOUNTER — Encounter: Payer: Self-pay | Admitting: Internal Medicine

## 2018-05-23 ENCOUNTER — Ambulatory Visit (INDEPENDENT_AMBULATORY_CARE_PROVIDER_SITE_OTHER): Payer: Medicare Other | Admitting: Internal Medicine

## 2018-05-23 DIAGNOSIS — I10 Essential (primary) hypertension: Secondary | ICD-10-CM

## 2018-05-23 DIAGNOSIS — Z7901 Long term (current) use of anticoagulants: Secondary | ICD-10-CM | POA: Diagnosis not present

## 2018-05-23 DIAGNOSIS — E119 Type 2 diabetes mellitus without complications: Secondary | ICD-10-CM

## 2018-05-23 DIAGNOSIS — E7800 Pure hypercholesterolemia, unspecified: Secondary | ICD-10-CM | POA: Diagnosis not present

## 2018-05-23 MED ORDER — LOSARTAN POTASSIUM 100 MG PO TABS: 100.0000 mg | ORAL_TABLET | Freq: Every day | ORAL | 3 refills | Status: DC

## 2018-05-23 MED ORDER — FERROUS SULFATE 325 (65 FE) MG PO TABS: 325.0000 mg | ORAL_TABLET | Freq: Every day | ORAL | 0 refills | Status: DC

## 2018-05-23 NOTE — Progress Notes (Signed)
[name], female   DOB: 1940-05-24, 78 y.o.   MRN: [id number]   Subjective:    [name], female    DOB: August 21, 1940, 78 y.o.   MRN: [id number]  HPI  Patient here for a scheduled follow up. She reports she is doing well.  Feels good.  Trying to stay active.  No cough or congestion.  No chest pain.  No sob.  No acid reflux.  No abdominal pain.  Bowels moving.  Checking her sugar q week.  States around 110-120.  Overall feels good.     Past Medical History:  Diagnosis Date  . Anemia   . Arthritis   . Atrial fibrillation ([place])   . Breast cancer ([place])    s/p lumpectomy 1992.  s/p chemo and xrt left breast  . Diabetes mellitus ([place])   . Edema    feet/legs  . Gastric ulcer   . GERD (gastroesophageal reflux disease)   . HOH (hard of hearing)    aides  . Hypercholesterolemia   . Hypertension   . Pulmonary emboli [facility])    Past Surgical History:  Procedure Laterality Date  . BREAST LUMPECTOMY  03/06/1990   left breast  . CATARACT EXTRACTION W/PHACO Right 12/26/2016   Procedure: CATARACT EXTRACTION PHACO AND INTRAOCULAR LENS PLACEMENT (IOC)-RIGHT DIABETIC;  Surgeon: Birder Robson, MD;  Location: [facility] ORS;  Service: Ophthalmology;  Laterality: Right;  [place] 00:47 AP% 24.5 CDE 11.62 Fluid pack lot # [id number]  . CATARACT EXTRACTION W/PHACO Left 01/23/2017   Procedure: CATARACT EXTRACTION PHACO AND INTRAOCULAR LENS PLACEMENT (IOC);  Surgeon: Birder Robson, MD;  Location: [facility] ORS;  Service: Ophthalmology;  Laterality: Left;  [place] 00:50 AP% 16.1 CDE 8.18 Fluid pack lot #[id number]   Family History  Problem Relation Age of Onset  . Heart disease Father   . Heart disease Brother        s/p CABG  . Colon cancer Neg Hx   . Breast cancer Neg Hx    Social History   Socioeconomic History  . Marital status: Widowed    Spouse name: Not on file  . Number of children: Not on file  . Years of education: Not on file  . Highest education level:  Not on file  Occupational History  . Not on file  Social Needs  . Financial resource strain: Not hard at all  . Food insecurity:    Worry: Never true    Inability: Never true  . Transportation needs:    Medical: No    Non-medical: No  Tobacco Use  . Smoking status: Never Smoker  . Smokeless tobacco: Never Used  Substance and Sexual Activity  . Alcohol use: No    Alcohol/week: 0.0 standard drinks  . Drug use: No  . Sexual activity: Never  Lifestyle  . Physical activity:    Days per week: 7 days    Minutes per session: 20 min  . Stress: Not at all  Relationships  . Social connections:    Talks on phone: Not on file    Gets together: Not on file    Attends religious service: Not on file    Active member of club or organization: Not on file    Attends meetings of clubs or organizations: Not on file    Relationship status: Not on file  Other Topics Concern  . Not on file  Social History Narrative  . Not on file    Outpatient Encounter Medications  as of 05/23/2018  Medication Sig  . CONTOUR TEST test strip USE TO TEST BLOOD SUGAR ONCE EVERY WEEK; IF DOSING HAS CHANGED PLEASE SUBMIT A NEW RX  . diphenhydrAMINE (BENADRYL) 25 mg capsule Take 25 mg at bedtime as needed by mouth for sleep.   . ferrous sulfate (FEROSUL) 325 (65 FE) MG tablet Take 1 tablet (325 mg total) by mouth daily.  . fesoterodine (TOVIAZ) 8 MG TB24 tablet Take 1 tablet (8 mg total) by mouth daily.  Marland Kitchen losartan (COZAAR) 100 MG tablet Take 1 tablet (100 mg total) by mouth daily.  . mirabegron ER (MYRBETRIQ) 50 MG TB24 tablet Take 50 mg by mouth daily.  . Multiple Vitamin (MULTIVITAMIN WITH MINERALS) TABS tablet Take 1 tablet daily by mouth.  . pantoprazole (PROTONIX) 40 MG tablet Take 1 tablet (40 mg total) by mouth daily.  . simvastatin (ZOCOR) 10 MG tablet TAKE ONE (1) TABLET BY MOUTH EVERY DAY (Patient taking differently: Take 10 mg by mouth daily at 6 PM. TAKE ONE (1) TABLET BY MOUTH EVERY DAY)  . warfarin  (COUMADIN) 2.5 MG tablet Takes 2 tablets (5 mg) on Tuesdays and Thursdays and Saturday and Sunday and 1 tablet (2.5 mg)  all other days of the week  . [DISCONTINUED] ferrous sulfate (FEROSUL) 325 (65 FE) MG tablet Take 1 tablet (325 mg total) by mouth daily.  . [DISCONTINUED] losartan (COZAAR) 100 MG tablet TAKE 1 TABLET BY MOUTH EVERY DAY (Patient taking differently: Take 100 mg by mouth daily. )   No facility-administered encounter medications on file as of 05/23/2018.     Review of Systems  Constitutional: Negative for appetite change and unexpected weight change.  HENT: Negative for congestion and sinus pressure.   Respiratory: Negative for cough, chest tightness and shortness of breath.   Cardiovascular: Negative for chest pain, palpitations and leg swelling.  Gastrointestinal: Negative for abdominal pain, diarrhea, nausea and vomiting.  Genitourinary: Negative for difficulty urinating and dysuria.  Musculoskeletal: Negative for joint swelling and myalgias.  Skin: Negative for color change and rash.  Neurological: Negative for dizziness, light-headedness and headaches.  Psychiatric/Behavioral: Negative for agitation and dysphoric mood.       Objective:    Physical Exam Constitutional:      General: She is not in acute distress.    Appearance: Normal appearance.  HENT:     Nose: Nose normal. No congestion.     Mouth/Throat:     Pharynx: No oropharyngeal exudate or posterior oropharyngeal erythema.  Neck:     Musculoskeletal: Neck supple. No muscular tenderness.     Thyroid: No thyromegaly.  Cardiovascular:     Rate and Rhythm: Normal rate and regular rhythm.  Pulmonary:     Effort: No respiratory distress.     Breath sounds: Normal breath sounds. No wheezing.  Abdominal:     General: Bowel sounds are normal.     Palpations: Abdomen is soft.     Tenderness: There is no abdominal tenderness.  Musculoskeletal:        General: No swelling or tenderness.  Lymphadenopathy:      Cervical: No cervical adenopathy.  Skin:    Findings: No erythema or rash.  Neurological:     Mental Status: She is alert.  Psychiatric:        Behavior: Behavior normal.     BP 100/60   Pulse 85   Temp 98.7 F (37.1 C) (Oral)   Wt 244 lb 3.2 oz (110.8 kg)   SpO2 94%  BMI 38.25 kg/m  Wt Readings from Last 3 Encounters:  05/23/18 244 lb 3.2 oz (110.8 kg)  04/01/18 241 lb (109.3 kg)  03/13/18 243 lb 6.4 oz (110.4 kg)     Lab Results  Component Value Date   WBC 6.3 02/14/2018   HGB 13.2 02/14/2018   HCT 39.7 02/14/2018   PLT 201 02/14/2018   GLUCOSE 121 (H) 02/14/2018   CHOL 128 11/14/2017   TRIG 76.0 11/14/2017   HDL 50.20 11/14/2017   LDLCALC 62 11/14/2017   ALT 18 11/14/2017   AST 22 11/14/2017   NA 139 02/14/2018   K 4.2 02/14/2018   CL 104 02/14/2018   CREATININE 1.01 (H) 02/14/2018   BUN 20 02/14/2018   CO2 27 02/14/2018   TSH 4.09 11/14/2017   INR 2.5 (H) 05/09/2018   HGBA1C 6.1 11/14/2017   MICROALBUR <0.7 11/14/2017       Assessment & Plan:   Problem List Items Addressed This Visit    Essential hypertension    Blood pressure on recheck wnl.  Continue same medications.  Follow pressure.  Follow metabolic panel.        Relevant Medications   losartan (COZAAR) 100 MG tablet   Hypercholesteremia    On simvastatin.  Low cholesterol diet and exercise.  Follow lipid panel and liver function tests.        Relevant Medications   losartan (COZAAR) 100 MG tablet   Other Relevant Orders   Hepatic function panel   Lipid panel   Long term current use of anticoagulant therapy    Due for f/u pt/inr in the next couple of weeks.        Relevant Orders   Protime-INR   Type 2 diabetes mellitus without complications ([place])    Low carb diet and exercise.  Follow met b and a1c.  Outside sugar readings doing well - as outlined.        Relevant Medications   losartan (COZAAR) 100 MG tablet   Other Relevant Orders   Hemoglobin P6L   Basic metabolic  panel       Einar Pheasant, MD

## 2018-05-24 ENCOUNTER — Encounter: Payer: Self-pay | Admitting: Internal Medicine

## 2018-05-24 NOTE — Assessment & Plan Note (Signed)
Low carb diet and exercise.  Follow met b and a1c.  Outside sugar readings doing well - as outlined.

## 2018-05-24 NOTE — Assessment & Plan Note (Signed)
Due for f/u pt/inr in the next couple of weeks.

## 2018-05-24 NOTE — Assessment & Plan Note (Signed)
On simvastatin.  Low cholesterol diet and exercise.  Follow lipid panel and liver function tests.   

## 2018-05-24 NOTE — Assessment & Plan Note (Signed)
Blood pressure on recheck wnl.  Continue same medications.  Follow pressure.  Follow metabolic panel.

## 2018-05-31 ENCOUNTER — Ambulatory Visit: Payer: Medicare Other

## 2018-06-05 ENCOUNTER — Other Ambulatory Visit: Payer: Self-pay

## 2018-06-05 ENCOUNTER — Other Ambulatory Visit (INDEPENDENT_AMBULATORY_CARE_PROVIDER_SITE_OTHER): Payer: Medicare Other

## 2018-06-05 DIAGNOSIS — E7800 Pure hypercholesterolemia, unspecified: Secondary | ICD-10-CM

## 2018-06-05 DIAGNOSIS — E119 Type 2 diabetes mellitus without complications: Secondary | ICD-10-CM | POA: Diagnosis not present

## 2018-06-05 DIAGNOSIS — Z7901 Long term (current) use of anticoagulants: Secondary | ICD-10-CM

## 2018-06-05 LAB — PROTIME-INR
INR: 2.6 ratio — ABNORMAL HIGH (ref 0.8–1.0)
Prothrombin Time: 30.3 s — ABNORMAL HIGH (ref 9.6–13.1)

## 2018-06-05 LAB — BASIC METABOLIC PANEL
BUN: 15 mg/dL (ref 6–23)
CO2: 24 mEq/L (ref 19–32)
Calcium: 9.3 mg/dL (ref 8.4–10.5)
Chloride: 101 mEq/L (ref 96–112)
Creatinine, Ser: 0.95 mg/dL (ref 0.40–1.20)
GFR: 56.57 mL/min — ABNORMAL LOW (ref >60.00)
Glucose, Bld: 110 mg/dL — ABNORMAL HIGH (ref 70–99)
Potassium: 4.4 mEq/L (ref 3.5–5.1)
Sodium: 137 mEq/L (ref 135–145)

## 2018-06-05 LAB — LIPID PANEL
Cholesterol: 146 mg/dL (ref 0–200)
HDL: 49.4 mg/dL (ref >39.00)
LDL Cholesterol: 77 mg/dL (ref 0–99)
NonHDL: 96.95
Total CHOL/HDL Ratio: 3
Triglycerides: 99 mg/dL (ref 0.0–149.0)
VLDL: 19.8 mg/dL (ref 0.0–40.0)

## 2018-06-05 LAB — HEPATIC FUNCTION PANEL
ALT: 16 U/L (ref 0–35)
AST: 19 U/L (ref 0–37)
Albumin: 4 g/dL (ref 3.5–5.2)
Alkaline Phosphatase: 45 U/L (ref 39–117)
Bilirubin, Direct: 0.1 mg/dL (ref 0.0–0.3)
Total Bilirubin: 0.6 mg/dL (ref 0.2–1.2)
Total Protein: 6.4 g/dL (ref 6.0–8.3)

## 2018-06-05 LAB — HEMOGLOBIN A1C: Hgb A1c MFr Bld: 6 % (ref 4.6–6.5)

## 2018-06-24 ENCOUNTER — Other Ambulatory Visit: Payer: Self-pay

## 2018-06-24 ENCOUNTER — Encounter: Payer: Self-pay | Admitting: Podiatry

## 2018-06-24 ENCOUNTER — Ambulatory Visit (INDEPENDENT_AMBULATORY_CARE_PROVIDER_SITE_OTHER): Payer: Medicare Other | Admitting: Podiatry

## 2018-06-24 VITALS — Temp 97.6°F

## 2018-06-24 DIAGNOSIS — B351 Tinea unguium: Secondary | ICD-10-CM

## 2018-06-24 DIAGNOSIS — E1142 Type 2 diabetes mellitus with diabetic polyneuropathy: Secondary | ICD-10-CM

## 2018-06-24 DIAGNOSIS — M79676 Pain in unspecified toe(s): Secondary | ICD-10-CM

## 2018-06-24 DIAGNOSIS — D689 Coagulation defect, unspecified: Secondary | ICD-10-CM | POA: Diagnosis not present

## 2018-06-24 DIAGNOSIS — M79609 Pain in unspecified limb: Principal | ICD-10-CM

## 2018-06-24 NOTE — Progress Notes (Signed)
Complaint:  Visit Type: Patient returns to my office for continued preventative foot care services. Complaint: Patient states" my nails have grown long and thick and become painful to walk and wear shoes" Patient has been diagnosed with DM with neuropathy. The patient presents for preventative foot care services. No changes to ROS.  Patient is taking coumadin.  Podiatric Exam: Vascular: dorsalis pedis  are palpable bilateral. Posterior tibial pulses are non palpable due to ankle/leg swelling. Capillary return is immediate. Temperature gradient is WNL. Skin turgor WNL  Sensorium: Diminished  Semmes Weinstein monofilament test. Normal tactile sensation bilaterally. Nail Exam: Pt has thick disfigured discolored nails with subungual debris noted bilateral entire nail hallux through fifth toenails.  Pincer hallux nails. Ulcer Exam: There is no evidence of ulcer or pre-ulcerative changes or infection. Orthopedic Exam: Muscle tone and strength are WNL. No limitations in general ROM. No crepitus or effusions noted. Foot type and digits show no abnormalities. Bony prominences are unremarkable. Skin: No Porokeratosis. No infection or ulcers  Diagnosis:  Onychomycosis, , Pain in right toe, pain in left toes  Treatment & Plan Procedures and Treatment: Consent by patient was obtained for treatment procedures. The patient understood the discussion of treatment and procedures well. All questions were answered thoroughly reviewed. Debridement of mycotic and hypertrophic toenails, 1 through 5 bilateral and clearing of subungual debris. No ulceration, no infection noted.  Return Visit-Office Procedure: Patient instructed to return to the office for a follow up visit 10 weeks  for continued evaluation and treatment.    Gardiner Barefoot DPM

## 2018-06-28 ENCOUNTER — Ambulatory Visit: Payer: Medicare Other

## 2018-06-29 ENCOUNTER — Other Ambulatory Visit: Payer: Self-pay | Admitting: Internal Medicine

## 2018-07-04 ENCOUNTER — Telehealth: Payer: Self-pay | Admitting: *Deleted

## 2018-07-04 DIAGNOSIS — Z7901 Long term (current) use of anticoagulants: Secondary | ICD-10-CM

## 2018-07-04 NOTE — Telephone Encounter (Signed)
Placed standing order for PT/INR

## 2018-07-05 ENCOUNTER — Other Ambulatory Visit: Payer: Self-pay

## 2018-07-05 ENCOUNTER — Other Ambulatory Visit (INDEPENDENT_AMBULATORY_CARE_PROVIDER_SITE_OTHER): Payer: Medicare Other

## 2018-07-05 DIAGNOSIS — Z7901 Long term (current) use of anticoagulants: Secondary | ICD-10-CM | POA: Diagnosis not present

## 2018-07-05 LAB — PROTIME-INR
INR: 2.7 ratio — ABNORMAL HIGH (ref 0.8–1.0)
Prothrombin Time: 31.3 s — ABNORMAL HIGH (ref 9.6–13.1)

## 2018-08-02 ENCOUNTER — Other Ambulatory Visit: Payer: Self-pay

## 2018-08-02 ENCOUNTER — Other Ambulatory Visit: Payer: Medicare Other

## 2018-08-02 NOTE — Addendum Note (Signed)
Addended by: Leeanne Rio on: 08/02/2018 03:58 PM   Modules accepted: Orders

## 2018-08-07 ENCOUNTER — Emergency Department: Payer: Medicare Other

## 2018-08-07 ENCOUNTER — Emergency Department
Admission: EM | Admit: 2018-08-07 | Discharge: 2018-08-08 | Disposition: A | Discharge status: Home / Self Care | Payer: Medicare Other | Attending: Emergency Medicine | Admitting: Emergency Medicine

## 2018-08-07 ENCOUNTER — Other Ambulatory Visit (INDEPENDENT_AMBULATORY_CARE_PROVIDER_SITE_OTHER): Payer: Medicare Other

## 2018-08-07 ENCOUNTER — Other Ambulatory Visit: Payer: Self-pay

## 2018-08-07 ENCOUNTER — Encounter: Payer: Self-pay | Admitting: *Deleted

## 2018-08-07 DIAGNOSIS — M25551 Pain in right hip: Secondary | ICD-10-CM | POA: Diagnosis not present

## 2018-08-07 DIAGNOSIS — I10 Essential (primary) hypertension: Secondary | ICD-10-CM | POA: Diagnosis not present

## 2018-08-07 DIAGNOSIS — R29818 Other symptoms and signs involving the nervous system: Secondary | ICD-10-CM | POA: Diagnosis not present

## 2018-08-07 DIAGNOSIS — Z7901 Long term (current) use of anticoagulants: Secondary | ICD-10-CM | POA: Diagnosis not present

## 2018-08-07 DIAGNOSIS — M25512 Pain in left shoulder: Secondary | ICD-10-CM | POA: Diagnosis not present

## 2018-08-07 DIAGNOSIS — W19XXXA Unspecified fall, initial encounter: Secondary | ICD-10-CM

## 2018-08-07 DIAGNOSIS — Z79899 Other long term (current) drug therapy: Secondary | ICD-10-CM | POA: Insufficient documentation

## 2018-08-07 DIAGNOSIS — E119 Type 2 diabetes mellitus without complications: Secondary | ICD-10-CM | POA: Insufficient documentation

## 2018-08-07 DIAGNOSIS — W010XXA Fall on same level from slipping, tripping and stumbling without subsequent striking against object, initial encounter: Secondary | ICD-10-CM | POA: Insufficient documentation

## 2018-08-07 DIAGNOSIS — R450 Nervousness: Secondary | ICD-10-CM | POA: Diagnosis not present

## 2018-08-07 DIAGNOSIS — Z853 Personal history of malignant neoplasm of breast: Secondary | ICD-10-CM | POA: Insufficient documentation

## 2018-08-07 DIAGNOSIS — R0902 Hypoxemia: Secondary | ICD-10-CM | POA: Diagnosis not present

## 2018-08-07 DIAGNOSIS — M25519 Pain in unspecified shoulder: Secondary | ICD-10-CM | POA: Diagnosis not present

## 2018-08-07 DIAGNOSIS — S4992XA Unspecified injury of left shoulder and upper arm, initial encounter: Secondary | ICD-10-CM | POA: Diagnosis not present

## 2018-08-07 DIAGNOSIS — R52 Pain, unspecified: Secondary | ICD-10-CM | POA: Diagnosis not present

## 2018-08-07 LAB — CBC WITH DIFFERENTIAL/PLATELET
Abs Immature Granulocytes: 0.12 10*3/uL — ABNORMAL HIGH (ref 0.00–0.07)
Basophils Absolute: 0 10*3/uL (ref 0.0–0.1)
Basophils Relative: 0 %
Eosinophils Absolute: 0 10*3/uL (ref 0.0–0.5)
Eosinophils Relative: 0 %
HCT: 37.6 % (ref 36.0–46.0)
Hemoglobin: 12.9 g/dL (ref 12.0–15.0)
Immature Granulocytes: 1 %
Lymphocytes Relative: 6 %
Lymphs Abs: 1 10*3/uL (ref 0.7–4.0)
MCH: 30.4 pg (ref 26.0–34.0)
MCHC: 34.3 g/dL (ref 30.0–36.0)
MCV: 88.5 fL (ref 80.0–100.0)
Monocytes Absolute: 1.8 10*3/uL — ABNORMAL HIGH (ref 0.1–1.0)
Monocytes Relative: 11 %
Neutro Abs: 13.5 10*3/uL — ABNORMAL HIGH (ref 1.7–7.7)
Neutrophils Relative %: 82 %
Platelets: 180 10*3/uL (ref 150–400)
RBC: 4.25 MIL/uL (ref 3.87–5.11)
RDW: 14.3 % (ref 11.5–15.5)
WBC: 16.4 10*3/uL — ABNORMAL HIGH (ref 4.0–10.5)
nRBC: 0 % (ref 0.0–0.2)

## 2018-08-07 LAB — COMPREHENSIVE METABOLIC PANEL
ALT: 22 U/L (ref 0–44)
AST: 29 U/L (ref 15–41)
Albumin: 3.8 g/dL (ref 3.5–5.0)
Alkaline Phosphatase: 39 U/L (ref 38–126)
Anion gap: 8 (ref 5–15)
BUN: 21 mg/dL (ref 8–23)
CO2: 22 mmol/L (ref 22–32)
Calcium: 9.4 mg/dL (ref 8.9–10.3)
Chloride: 108 mmol/L (ref 98–111)
Creatinine, Ser: 0.82 mg/dL (ref 0.44–1.00)
GFR calc Af Amer: >60 mL/min (ref >60)
GFR calc non Af Amer: >60 mL/min (ref >60)
Glucose, Bld: 142 mg/dL — ABNORMAL HIGH (ref 70–99)
Potassium: 3.7 mmol/L (ref 3.5–5.1)
Sodium: 138 mmol/L (ref 135–145)
Total Bilirubin: 0.8 mg/dL (ref 0.3–1.2)
Total Protein: 6.7 g/dL (ref 6.5–8.1)

## 2018-08-07 LAB — PROTIME-INR
INR: 3.7 ratio — ABNORMAL HIGH (ref 0.8–1.0)
Prothrombin Time: 42.5 s — ABNORMAL HIGH (ref 9.6–13.1)

## 2018-08-07 MED ORDER — MORPHINE SULFATE (PF) 4 MG/ML IV SOLN
4.0000 mg | Freq: Once | INTRAVENOUS | Status: AC
  Administered 2018-08-07: 4 mg via INTRAVENOUS
  Filled 2018-08-07 (×2): qty 1

## 2018-08-07 NOTE — ED Triage Notes (Signed)
Per EMS report, patient lost balance and fell on buttocks. Patient denies LOC or hitting her head. Patient c/o left shoulder pain and limited range of motion. Patient is on Coumadin.

## 2018-08-07 NOTE — ED Provider Notes (Signed)
[facility] Emergency Department Provider Note  ____________________________________________  Time seen: Approximately 10:25 PM  I have reviewed the triage vital signs and the nursing notes.   HISTORY  Chief Complaint Fall    HPI Anita Walker is a 78 y.o. female presents to the emergency department after a mechanical, non-syncopal fall that occurred in her home.  Patient reports that she lost her balance while attempting to lock her storm door and fell on her right hip. She is also complaining of left shoulder.  Patient states that she has not attempted to ambulate since injury occurred.  She denies hitting her head or losing consciousness. She is currently taking warfarin.  No neck pain.  She denies chest pain, chest tightness, nausea, vomiting or abdominal pain.  No other alleviating measures have been attempted.        Past Medical History:  Diagnosis Date  . Anemia   . Arthritis   . Atrial fibrillation ([place])   . Breast cancer ([place])    s/p lumpectomy 1992.  s/p chemo and xrt left breast  . Diabetes mellitus ([place])   . Edema    feet/legs  . Gastric ulcer   . GERD (gastroesophageal reflux disease)   . HOH (hard of hearing)    aides  . Hypercholesterolemia   . Hypertension   . Pulmonary emboli [facility])     Patient Active Problem List   Diagnosis Date Noted  . Diabetic polyneuropathy associated with type 2 diabetes mellitus ([place]) 03/18/2017  . Abnormal chest CT 03/18/2017  . Urinary frequency 01/21/2017  . Syncope 11/10/2016  . Right hip pain 10/30/2016  . Cough 10/25/2016  . Health care maintenance 05/23/2014  . Mixed emotional features as adjustment reaction 07/20/2013  . Long term current use of anticoagulant therapy 04/11/2013  . Hypertension 01/01/2012  . Hypercholesteremia 01/01/2012  . History of breast cancer 01/01/2012  . Essential hypertension 01/01/2012  . Pure hypercholesterolemia 01/01/2012  . Type 2 diabetes mellitus  without complications ([place]) 93/81/8299    Past Surgical History:  Procedure Laterality Date  . BREAST LUMPECTOMY  03/06/1990   left breast  . CATARACT EXTRACTION W/PHACO Right 12/26/2016   Procedure: CATARACT EXTRACTION PHACO AND INTRAOCULAR LENS PLACEMENT (IOC)-RIGHT DIABETIC;  Surgeon: Birder Robson, MD;  Location: [facility] ORS;  Service: Ophthalmology;  Laterality: Right;  [place] 00:47 AP% 24.5 CDE 11.62 Fluid pack lot # [id number]  . CATARACT EXTRACTION W/PHACO Left 01/23/2017   Procedure: CATARACT EXTRACTION PHACO AND INTRAOCULAR LENS PLACEMENT (IOC);  Surgeon: Birder Robson, MD;  Location: [facility] ORS;  Service: Ophthalmology;  Laterality: Left;  [place] 00:50 AP% 16.1 CDE 8.18 Fluid pack lot #[id number] H    Prior to Admission medications   Medication Sig Start Date End Date Taking? Authorizing Provider  CONTOUR TEST test strip USE TO TEST BLOOD SUGAR ONCE EVERY WEEK; IF DOSING HAS CHANGED PLEASE SUBMIT A NEW RX 11/28/17   Einar Pheasant, MD  diphenhydrAMINE (BENADRYL) 25 mg capsule Take 25 mg at bedtime as needed by mouth for sleep.     [provider]  ferrous sulfate (FEROSUL) 325 (65 FE) MG tablet Take 1 tablet (325 mg total) by mouth daily. 05/23/18   Einar Pheasant, MD  fesoterodine (TOVIAZ) 8 MG TB24 tablet Take 1 tablet (8 mg total) by mouth daily. 01/03/18   Zara Council A, PA-C  losartan (COZAAR) 100 MG tablet Take 1 tablet (100 mg total) by mouth daily. 05/23/18   Einar Pheasant, MD  mirabegron ER (MYRBETRIQ) 50 MG  TB24 tablet Take 50 mg by mouth daily.    [provider]  Multiple Vitamin (MULTIVITAMIN WITH MINERALS) TABS tablet Take 1 tablet daily by mouth.    [provider]  pantoprazole (PROTONIX) 40 MG tablet Take 1 tablet (40 mg total) by mouth daily. 01/15/18   Einar Pheasant, MD  simvastatin (ZOCOR) 10 MG tablet TAKE 1 TABLET BY MOUTH EVERY DAY 07/01/18   Einar Pheasant, MD  warfarin (COUMADIN) 2.5 MG tablet Takes 2 tablets (5 mg) on Tuesdays  and Thursdays and Saturday and Sunday and 1 tablet (2.5 mg)  all other days of the week 03/20/18   Einar Pheasant, MD    Allergies Penicillins and Penicillin v potassium  Family History  Problem Relation Age of Onset  . Heart disease Father   . Heart disease Brother        s/p CABG  . Colon cancer Neg Hx   . Breast cancer Neg Hx     Social History Social History   Tobacco Use  . Smoking status: Never Smoker  . Smokeless tobacco: Never Used  Substance Use Topics  . Alcohol use: No    Alcohol/week: 0.0 standard drinks  . Drug use: No     Review of Systems  Constitutional: No fever/chills Eyes: No visual changes. No discharge ENT: No upper respiratory complaints. Cardiovascular: no chest pain. Respiratory: no cough. No SOB. Gastrointestinal: No abdominal pain.  No nausea, no vomiting.  No diarrhea.  No constipation. Musculoskeletal: Patient has left shoulder and right hip pain.  Skin: Negative for rash, abrasions, lacerations, ecchymosis. Neurological: Negative for headaches, focal weakness or numbness.   ____________________________________________   PHYSICAL EXAM:  VITAL SIGNS: ED Triage Vitals  Enc Vitals Group     BP 08/07/18 2039 (!) 158/83     Pulse Rate 08/07/18 2039 (!) 101     Resp 08/07/18 2039 18     Temp 08/07/18 2039 97.7 F (36.5 C)     Temp Source 08/07/18 2039 Oral     SpO2 08/07/18 2039 96 %     Weight 08/07/18 2042 243 lb (110.2 kg)     Height 08/07/18 2042 5\' 8"  (1.727 m)     Head Circumference --      Peak Flow --      Pain Score 08/07/18 2041 5     Pain Loc --      Pain Edu? --      Excl. in [place]? --      Constitutional: Alert and oriented. Well appearing and in no acute distress. Eyes: Conjunctivae are normal. PERRL. EOMI. Head: Atraumatic. ENT: Neck: No stridor.  FROM.   Cardiovascular: Normal rate, regular rhythm. Normal S1 and S2.  Good peripheral circulation. Respiratory: Normal respiratory effort without tachypnea or  retractions. Lungs CTAB. Good air entry to the bases with no decreased or absent breath sounds. Gastrointestinal: Bowel sounds 4 quadrants. Soft and nontender to palpation. No guarding or rigidity. No palpable masses. No distention. No CVA tenderness. Musculoskeletal: 5 out of 5 strength in the upper and lower extremities.  Patient is unable to perform full range of motion at the left shoulder or right hip. Neurologic:  Normal speech and language. No gross focal neurologic deficits are appreciated.  Skin:  Skin is warm, dry and intact. No rash noted. Psychiatric: Mood and affect are normal. Speech and behavior are normal. Patient exhibits appropriate insight and judgement.   ____________________________________________   LABS (all labs ordered are listed, but only abnormal results  are displayed)  Labs Reviewed  CBC WITH DIFFERENTIAL/PLATELET - Abnormal; Notable for the following components:      Result Value   WBC 16.4 (*)    Neutro Abs 13.5 (*)    Monocytes Absolute 1.8 (*)    Abs Immature Granulocytes 0.12 (*)    All other components within normal limits  COMPREHENSIVE METABOLIC PANEL  URINALYSIS, COMPLETE (UACMP) WITH MICROSCOPIC   ____________________________________________  EKG   ____________________________________________  RADIOLOGY     No results found.  ____________________________________________    PROCEDURES  Procedure(s) performed:    Procedures    Medications  morphine 4 MG/ML injection 4 mg (4 mg Intravenous Given 08/07/18 2335)     ____________________________________________   INITIAL IMPRESSION / ASSESSMENT AND PLAN / ED COURSE  Pertinent labs & imaging results that were available during my care of the patient were reviewed by me and considered in my medical decision making (see chart for details).  Review of the [place] CSRS was performed in accordance of the [place] prior to dispensing any controlled drugs.           Assessment and  Plan: Fall:  78 year old female presents to the emergency department after a mechanical non-syncopal fall that occurred tonight.  Patient complained of left shoulder pain and right hip pain.  On physical exam, patient had a reassuring neuro exam.  Patient was mildly tachycardic and hypertensive upon initial presentation.  She was unable to perform full range of motion at the left shoulder and the right hip due to pain.  Differential diagnosis included subdural hematoma, subarachnoid hemorrhage, skull fracture, right hip fracture, clavicle fracture..  CBC, CMP and UA are pending.  Awaiting CT head, CT cervical spine and x-rays of the right hip and left shoulder.  Patient care was turned over to attending, Dr. Alfred Levins as Flex transitioned to closing.    ____________________________________________  FINAL CLINICAL IMPRESSION(S) / ED DIAGNOSES  Final diagnoses:  Fall, initial encounter      NEW MEDICATIONS STARTED DURING THIS VISIT:  ED Discharge Orders    None          This chart was dictated using voice recognition software/Dragon. Despite best efforts to proofread, errors can occur which can change the meaning. Any change was purely unintentional.    Lannie Fields, PA-C 08/07/18 [place], [place], MD 08/08/18 [phone]

## 2018-08-07 NOTE — ED Notes (Signed)
Patient taken to imaging. 

## 2018-08-08 ENCOUNTER — Ambulatory Visit (INDEPENDENT_AMBULATORY_CARE_PROVIDER_SITE_OTHER): Payer: Medicare Other | Admitting: Family Medicine

## 2018-08-08 ENCOUNTER — Emergency Department: Payer: Medicare Other

## 2018-08-08 DIAGNOSIS — S0990XS Unspecified injury of head, sequela: Secondary | ICD-10-CM

## 2018-08-08 DIAGNOSIS — Z7901 Long term (current) use of anticoagulants: Secondary | ICD-10-CM | POA: Diagnosis not present

## 2018-08-08 DIAGNOSIS — W19XXXS Unspecified fall, sequela: Secondary | ICD-10-CM

## 2018-08-08 DIAGNOSIS — M25551 Pain in right hip: Secondary | ICD-10-CM

## 2018-08-08 DIAGNOSIS — S4992XA Unspecified injury of left shoulder and upper arm, initial encounter: Secondary | ICD-10-CM | POA: Diagnosis not present

## 2018-08-08 DIAGNOSIS — R29818 Other symptoms and signs involving the nervous system: Secondary | ICD-10-CM | POA: Diagnosis not present

## 2018-08-08 DIAGNOSIS — M25512 Pain in left shoulder: Secondary | ICD-10-CM

## 2018-08-08 LAB — URINALYSIS, COMPLETE (UACMP) WITH MICROSCOPIC
Bacteria, UA: NONE SEEN
Bilirubin Urine: NEGATIVE
Glucose, UA: NEGATIVE mg/dL
Hgb urine dipstick: NEGATIVE
Ketones, ur: 5 mg/dL — AB
Leukocytes,Ua: NEGATIVE
Nitrite: NEGATIVE
Protein, ur: NEGATIVE mg/dL
Specific Gravity, Urine: 1.019 (ref 1.005–1.030)
pH: 5 (ref 5.0–8.0)

## 2018-08-08 MED ORDER — ACETAMINOPHEN 500 MG PO TABS
1000.0000 mg | ORAL_TABLET | Freq: Once | ORAL | Status: AC
  Administered 2018-08-08: 1000 mg via ORAL
  Filled 2018-08-08: qty 2

## 2018-08-08 MED ORDER — ONDANSETRON HCL 4 MG/2ML IJ SOLN
INTRAMUSCULAR | Status: AC
  Filled 2018-08-08: qty 2

## 2018-08-08 MED ORDER — ONDANSETRON HCL 4 MG/2ML IJ SOLN: 4.0000 mg | Freq: Once | INTRAMUSCULAR | Status: DC

## 2018-08-08 NOTE — ED Notes (Signed)
Pt's daughter updated at this time. 

## 2018-08-08 NOTE — ED Notes (Signed)
Patient transported to CT 

## 2018-08-08 NOTE — Progress Notes (Signed)
[name], female   DOB: 07/01/1940, 78 y.o.   MRN: [id number]    Virtual Visit via video Note  This visit type was conducted due to national recommendations for restrictions regarding the COVID-19 pandemic (e.g. social distancing).  This format is felt to be most appropriate for this patient at this time.  All issues noted in this document were discussed and addressed.  No physical exam was performed (except for noted visual exam findings with Video Visits).   I connected with Anita Walker & daughter today at  9:20 AM EDT by a video enabled telemedicine application or telephone and verified that I am speaking with the correct person using two identifiers. Location patient: home Location provider: [facility] Persons participating in the virtual visit: patient, provider, daughter  I discussed the limitations, risks, security and privacy concerns of performing an evaluation and management service by video and the availability of in person appointments. I also discussed with the patient that there may be a patient responsible charge related to this service. The patient expressed understanding and agreed to proceed.  HPI:  Patient and I connected via video due to need for ER follow-up.  Patient had a fall pn 08/07/2018 landing on her bottom causing pain in right hip, also complains of pain in left shoulder from the fall, thinks when she attempted to reach out to catch herself this is why she is having pain in left shoulder.  Patient denies hitting head, but  head, CT of head and spine were performed in ER.  All imaging done in ER was reviewed, x-rays of his left shoulder and right hip are negative for any fracture.  CT scan of head and CT scan of cervical spine are negative for any acute abnormalities. Labs reviewed as well.   Per daughter patient is still in a lot of pain.  States he did not get out of the ER to very early this morning around 4 AM.  States her mother has a  large bruise on her bottom from the fall.  States they are using Tylenol as needed for pain, but not sure if this is helping.  Denies cough, shortness of breath or wheezing.  Denies fever or chills.  Denies nausea, vomiting or diarrhea.  Denies urinary problems.  Denies numbness in lower extremities.  Denies any change in neurological status.    ROS: See pertinent positives and negatives per HPI.  Past Medical History:  Diagnosis Date  . Anemia   . Arthritis   . Atrial fibrillation ([place])   . Breast cancer ([place])    s/p lumpectomy 1992.  s/p chemo and xrt left breast  . Diabetes mellitus ([place])   . Edema    feet/legs  . Gastric ulcer   . GERD (gastroesophageal reflux disease)   . HOH (hard of hearing)    aides  . Hypercholesterolemia   . Hypertension   . Pulmonary emboli [facility])     Past Surgical History:  Procedure Laterality Date  . BREAST LUMPECTOMY  03/06/1990   left breast  . CATARACT EXTRACTION W/PHACO Right 12/26/2016   Procedure: CATARACT EXTRACTION PHACO AND INTRAOCULAR LENS PLACEMENT (IOC)-RIGHT DIABETIC;  Surgeon: Birder Robson, MD;  Location: [facility] ORS;  Service: Ophthalmology;  Laterality: Right;  [place] 00:47 AP% 24.5 CDE 11.62 Fluid pack lot # [id number]  . CATARACT EXTRACTION W/PHACO Left 01/23/2017   Procedure: CATARACT EXTRACTION PHACO AND INTRAOCULAR LENS PLACEMENT (IOC);  Surgeon: Birder Robson, MD;  Location: [facility] ORS;  Service: Ophthalmology;  Laterality: Left;  [place] 00:50 AP% 16.1 CDE 8.18 Fluid pack lot #[id number] H    Family History  Problem Relation Age of Onset  . Heart disease Father   . Heart disease Brother        s/p CABG  . Colon cancer Neg Hx   . Breast cancer Neg Hx     Social History   Tobacco Use  . Smoking status: Never Smoker  . Smokeless tobacco: Never Used  Substance Use Topics  . Alcohol use: No    Alcohol/week: 0.0 standard drinks    Current Outpatient Medications:  .  CONTOUR TEST test strip, USE TO TEST BLOOD SUGAR ONCE  EVERY WEEK; IF DOSING HAS CHANGED PLEASE SUBMIT A NEW RX, Disp: 25 each, Rfl: 6 .  diphenhydrAMINE (BENADRYL) 25 mg capsule, Take 25 mg at bedtime as needed by mouth for sleep. , Disp: , Rfl:  .  ferrous sulfate (FEROSUL) 325 (65 FE) MG tablet, Take 1 tablet (325 mg total) by mouth daily., Disp: 90 tablet, Rfl: 0 .  fesoterodine (TOVIAZ) 8 MG TB24 tablet, Take 1 tablet (8 mg total) by mouth daily., Disp: 30 tablet, Rfl: 11 .  losartan (COZAAR) 100 MG tablet, Take 1 tablet (100 mg total) by mouth daily., Disp: 90 tablet, Rfl: 3 .  mirabegron ER (MYRBETRIQ) 50 MG TB24 tablet, Take 50 mg by mouth daily., Disp: , Rfl:  .  Multiple Vitamin (MULTIVITAMIN WITH MINERALS) TABS tablet, Take 1 tablet daily by mouth., Disp: , Rfl:  .  pantoprazole (PROTONIX) 40 MG tablet, Take 1 tablet (40 mg total) by mouth daily., Disp: 90 tablet, Rfl: 1 .  simvastatin (ZOCOR) 10 MG tablet, TAKE 1 TABLET BY MOUTH EVERY DAY, Disp: 90 tablet, Rfl: 1 .  warfarin (COUMADIN) 2.5 MG tablet, Takes 2 tablets (5 mg) on Tuesdays and Thursdays and Saturday and Sunday and 1 tablet (2.5 mg)  all other days of the week, Disp: 142 tablet, Rfl: 2  EXAM:  GENERAL: alert, oriented, appears well and in no acute distress  HEENT: atraumatic, conjunttiva clear, no obvious abnormalities on inspection of external nose and ears  NECK: normal movements of the head and neck  LUNGS: on inspection no signs of respiratory distress, breathing rate appears normal, no obvious gross SOB, gasping or wheezing  CV: no obvious cyanosis  MS: moves all visible extremities without noticeable abnormality  PSYCH/NEURO: pleasant and cooperative, no obvious depression or anxiety, speech and thought processing grossly intact  ASSESSMENT AND PLAN:  Discussed the following assessment and plan:  Anticoagulant long-term use - Plan: CT Head Wo Contrast  Fall, sequela - Plan: CBC w/Diff, CT Head Wo Contrast  Traumatic injury of head, sequela - Plan: CT Head  Wo Contrast  Pain of right hip joint  Acute pain of left shoulder  CT of the head does not show any acute abnormalities in the ER, patient denied hitting her head to ER staff but now since saying she is unsure if she hit her head.  Will get follow-up CT of head to make sure no microvascular bleed has occurred due to her being on Coumadin.  Also reassured patient and daughter that most likely she will still continue to have some pain in shoulder and hip because the fall happened just 1 day ago, and body needs time to heal.  Advised she can use Tylenol as needed to help pain and topical rub such as BenGay or Biofreeze are helpful as well.   We will  also repeat CBC when patient goes in for her head CT as her white cells were slightly elevated in ER, elevation in white cells could be related to the trauma of the fall.  Other lab studies and urine studies did not reveal any obvious cause of infection  See other telephone encounter-daughter called back later on and requested stronger pain medication and some physical therapy for her mom.  Physical therapy referral placed and also tramadol sent in to use as needed for pain.   I discussed the assessment and treatment plan with the patient. The patient was provided an opportunity to ask questions and all were answered. The patient agreed with the plan and demonstrated an understanding of the instructions.   The patient was advised to call back or seek an in-person evaluation if the symptoms worsen or if the condition fails to improve as anticipated. Advised if pain becomes severe over the weekend -- she should seek inperson eval at [facility] or ER   Jodelle Green, FNP

## 2018-08-08 NOTE — Discharge Instructions (Addendum)

## 2018-08-09 ENCOUNTER — Encounter: Payer: Self-pay | Admitting: Internal Medicine

## 2018-08-09 ENCOUNTER — Telehealth: Payer: Self-pay | Admitting: *Deleted

## 2018-08-09 ENCOUNTER — Telehealth: Payer: Self-pay

## 2018-08-09 DIAGNOSIS — Z7901 Long term (current) use of anticoagulants: Secondary | ICD-10-CM

## 2018-08-09 DIAGNOSIS — M25551 Pain in right hip: Secondary | ICD-10-CM

## 2018-08-09 DIAGNOSIS — W19XXXS Unspecified fall, sequela: Secondary | ICD-10-CM

## 2018-08-09 MED ORDER — TRAMADOL HCL 50 MG PO TABS: 50.0000 mg | ORAL_TABLET | Freq: Three times a day (TID) | ORAL | 0 refills | Status: DC | PRN

## 2018-08-09 NOTE — Telephone Encounter (Signed)
Called Pt to tell her that the Tramadol was sent to the pharmacy to help her with her moderate to severe pain to pick up

## 2018-08-09 NOTE — Telephone Encounter (Signed)
Orders placed for f/u INR

## 2018-08-09 NOTE — Telephone Encounter (Signed)
It appears she has called and the message was routed to [facility] for request for something stronger for pain medication.  Regarding the request for PT, need more information regarding why she would need PT - for strengthening, balance, etc.

## 2018-08-09 NOTE — Telephone Encounter (Signed)
I sent in some tramadol to use as needed for more moderate to severe pain.

## 2018-08-09 NOTE — Telephone Encounter (Signed)
Pt is requesting PT referral more for strength but could help with balance as well. Her daughter states that it is hard for her to get up and down. Also, I have reached out to [facility] about the pain medication. Sent message to Lauren and Dr. Nicki Reaper as well. Advised patient that someone will be calling her

## 2018-08-09 NOTE — Telephone Encounter (Signed)
Pt called [facility] Pt fell and had virtual appt with Lauren and has been taking tylenol for pain, call to say tylenol is not touching the pain and is asking if something stronger an be called in for pain   [place]

## 2018-08-09 NOTE — Telephone Encounter (Signed)
Copied from [place] #[id number]. Topic: General - Other >> Aug 09, 2018  8:44 AM Carolyn Stare wrote:  Pt fell and had virtual appt with Lauren and has been taking tylenol for pain, call to say tylenol is not touching the pain and is asking if something stronger an be called in for pain   [place]

## 2018-08-10 NOTE — Telephone Encounter (Signed)
Reviewed chart.  Lauren sent in rx for tramadol and placed order for referral to PT.

## 2018-08-12 ENCOUNTER — Encounter: Payer: Self-pay | Admitting: Family Medicine

## 2018-08-12 ENCOUNTER — Ambulatory Visit: Payer: Medicare Other

## 2018-08-12 ENCOUNTER — Ambulatory Visit
Admission: EM | Admit: 2018-08-12 | Discharge: 2018-08-12 | Disposition: A | Discharge status: Home / Self Care | Payer: Medicare Other | Source: Home / Self Care

## 2018-08-12 ENCOUNTER — Encounter: Payer: Self-pay | Admitting: Emergency Medicine

## 2018-08-12 ENCOUNTER — Ambulatory Visit
Admission: EM | Admit: 2018-08-12 | Discharge: 2018-08-12 | Disposition: A | Discharge status: Home / Self Care | Payer: Medicare Other | Attending: Family Medicine | Admitting: Family Medicine

## 2018-08-12 ENCOUNTER — Other Ambulatory Visit: Payer: Self-pay

## 2018-08-12 DIAGNOSIS — M25512 Pain in left shoulder: Secondary | ICD-10-CM | POA: Diagnosis not present

## 2018-08-12 DIAGNOSIS — Z7901 Long term (current) use of anticoagulants: Secondary | ICD-10-CM | POA: Insufficient documentation

## 2018-08-12 DIAGNOSIS — I4891 Unspecified atrial fibrillation: Secondary | ICD-10-CM | POA: Insufficient documentation

## 2018-08-12 DIAGNOSIS — E1142 Type 2 diabetes mellitus with diabetic polyneuropathy: Secondary | ICD-10-CM | POA: Diagnosis not present

## 2018-08-12 DIAGNOSIS — Z86711 Personal history of pulmonary embolism: Secondary | ICD-10-CM | POA: Diagnosis not present

## 2018-08-12 DIAGNOSIS — M533 Sacrococcygeal disorders, not elsewhere classified: Secondary | ICD-10-CM | POA: Diagnosis not present

## 2018-08-12 DIAGNOSIS — Z880 Allergy status to penicillin: Secondary | ICD-10-CM | POA: Diagnosis not present

## 2018-08-12 DIAGNOSIS — K219 Gastro-esophageal reflux disease without esophagitis: Secondary | ICD-10-CM | POA: Insufficient documentation

## 2018-08-12 DIAGNOSIS — D649 Anemia, unspecified: Secondary | ICD-10-CM | POA: Insufficient documentation

## 2018-08-12 DIAGNOSIS — Z8249 Family history of ischemic heart disease and other diseases of the circulatory system: Secondary | ICD-10-CM | POA: Diagnosis not present

## 2018-08-12 DIAGNOSIS — I10 Essential (primary) hypertension: Secondary | ICD-10-CM | POA: Diagnosis not present

## 2018-08-12 DIAGNOSIS — S40012A Contusion of left shoulder, initial encounter: Secondary | ICD-10-CM | POA: Diagnosis not present

## 2018-08-12 DIAGNOSIS — T148XXA Other injury of unspecified body region, initial encounter: Secondary | ICD-10-CM

## 2018-08-12 DIAGNOSIS — S300XXA Contusion of lower back and pelvis, initial encounter: Secondary | ICD-10-CM | POA: Insufficient documentation

## 2018-08-12 DIAGNOSIS — E7800 Pure hypercholesterolemia, unspecified: Secondary | ICD-10-CM | POA: Diagnosis not present

## 2018-08-12 DIAGNOSIS — Z79899 Other long term (current) drug therapy: Secondary | ICD-10-CM | POA: Diagnosis not present

## 2018-08-12 DIAGNOSIS — Y92009 Unspecified place in unspecified non-institutional (private) residence as the place of occurrence of the external cause: Secondary | ICD-10-CM | POA: Diagnosis not present

## 2018-08-12 DIAGNOSIS — W19XXXD Unspecified fall, subsequent encounter: Secondary | ICD-10-CM | POA: Diagnosis not present

## 2018-08-12 DIAGNOSIS — W19XXXA Unspecified fall, initial encounter: Secondary | ICD-10-CM | POA: Diagnosis not present

## 2018-08-12 DIAGNOSIS — M7989 Other specified soft tissue disorders: Secondary | ICD-10-CM | POA: Diagnosis not present

## 2018-08-12 DIAGNOSIS — Z9012 Acquired absence of left breast and nipple: Secondary | ICD-10-CM | POA: Diagnosis not present

## 2018-08-12 DIAGNOSIS — S4992XA Unspecified injury of left shoulder and upper arm, initial encounter: Secondary | ICD-10-CM | POA: Diagnosis not present

## 2018-08-12 DIAGNOSIS — R350 Frequency of micturition: Secondary | ICD-10-CM | POA: Diagnosis not present

## 2018-08-12 DIAGNOSIS — Z853 Personal history of malignant neoplasm of breast: Secondary | ICD-10-CM | POA: Insufficient documentation

## 2018-08-12 MED ORDER — HYDROCODONE-ACETAMINOPHEN 5-325 MG PO TABS: ORAL_TABLET | ORAL | 0 refills | Status: DC

## 2018-08-12 NOTE — Telephone Encounter (Signed)
If increased pain, nausea and tramadol not helping then needs to be reevaluated as we discussed

## 2018-08-12 NOTE — Telephone Encounter (Signed)
Patients daughter is aware and is taking her to urgent care today to be re-evaluated. Going to keep appt with PCP on 6/10

## 2018-08-12 NOTE — ED Provider Notes (Signed)
[facility]    CSN: [id number] Arrival date & time: 08/12/18  1354     History   Chief Complaint Chief Complaint  Patient presents with  . Fall  . Tailbone Pain    HPI Anita Walker is a 78 y.o. female.   78 yo female with a c/o tailbone pain and left collarbone pain since falling at home on 6/3. Patient was seen in the ED on 6/3 and had some x-rays done, however states tailbone pain and clavicle pain have worsened and tramadol is not helping for pain. Denies any numbness/tingling, bowel or bladder problems.     Fall     Past Medical History:  Diagnosis Date  . Anemia   . Arthritis   . Atrial fibrillation ([place])   . Breast cancer ([place])    s/p lumpectomy 1992.  s/p chemo and xrt left breast  . Diabetes mellitus ([place])   . Edema    feet/legs  . Gastric ulcer   . GERD (gastroesophageal reflux disease)   . HOH (hard of hearing)    aides  . Hypercholesterolemia   . Hypertension   . Pulmonary emboli [facility])     Patient Active Problem List   Diagnosis Date Noted  . Diabetic polyneuropathy associated with type 2 diabetes mellitus ([place]) 03/18/2017  . Abnormal chest CT 03/18/2017  . Urinary frequency 01/21/2017  . Syncope 11/10/2016  . Right hip pain 10/30/2016  . Cough 10/25/2016  . Health care maintenance 05/23/2014  . Mixed emotional features as adjustment reaction 07/20/2013  . Long term current use of anticoagulant therapy 04/11/2013  . Hypertension 01/01/2012  . Hypercholesteremia 01/01/2012  . History of breast cancer 01/01/2012  . Essential hypertension 01/01/2012  . Pure hypercholesterolemia 01/01/2012  . Type 2 diabetes mellitus without complications ([place]) 85/04/7739    Past Surgical History:  Procedure Laterality Date  . BREAST LUMPECTOMY  03/06/1990   left breast  . CATARACT EXTRACTION W/PHACO Right 12/26/2016   Procedure: CATARACT EXTRACTION PHACO AND INTRAOCULAR LENS PLACEMENT (IOC)-RIGHT DIABETIC;  Surgeon: Birder Robson, MD;   Location: [facility] ORS;  Service: Ophthalmology;  Laterality: Right;  [place] 00:47 AP% 24.5 CDE 11.62 Fluid pack lot # [id number]  . CATARACT EXTRACTION W/PHACO Left 01/23/2017   Procedure: CATARACT EXTRACTION PHACO AND INTRAOCULAR LENS PLACEMENT (IOC);  Surgeon: Birder Robson, MD;  Location: [facility] ORS;  Service: Ophthalmology;  Laterality: Left;  [place] 00:50 AP% 16.1 CDE 8.18 Fluid pack lot #[id number]    OB History   No obstetric history on file.      Home Medications    Prior to Admission medications   Medication Sig Start Date End Date Taking? Authorizing Provider  diphenhydrAMINE (BENADRYL) 25 mg capsule Take 25 mg at bedtime as needed by mouth for sleep.    Yes [provider]  ferrous sulfate (FEROSUL) 325 (65 FE) MG tablet Take 1 tablet (325 mg total) by mouth daily. 05/23/18  Yes Einar Pheasant, MD  fesoterodine (TOVIAZ) 8 MG TB24 tablet Take 1 tablet (8 mg total) by mouth daily. 01/03/18  Yes McGowan, Larene Beach A, PA-C  losartan (COZAAR) 100 MG tablet Take 1 tablet (100 mg total) by mouth daily. 05/23/18  Yes Einar Pheasant, MD  simvastatin (ZOCOR) 10 MG tablet TAKE 1 TABLET BY MOUTH EVERY DAY 07/01/18  Yes Einar Pheasant, MD  traMADol (ULTRAM) 50 MG tablet Take 1 tablet (50 mg total) by mouth every 8 (eight) hours as needed for up to 5 days. 08/09/18 08/14/18 Yes Guse, Lauren  M, FNP  warfarin (COUMADIN) 2.5 MG tablet Takes 2 tablets (5 mg) on Tuesdays and Thursdays and Saturday and Sunday and 1 tablet (2.5 mg)  all other days of the week 03/20/18  Yes Einar Pheasant, MD  CONTOUR TEST test strip USE TO TEST BLOOD SUGAR ONCE EVERY WEEK; IF DOSING HAS CHANGED PLEASE SUBMIT A NEW RX 11/28/17   Einar Pheasant, MD  HYDROcodone-acetaminophen (NORCO/VICODIN) 5-325 MG tablet 1-2 tabs po bid prn 08/12/18   Norval Gable, MD  mirabegron ER (MYRBETRIQ) 50 MG TB24 tablet Take 50 mg by mouth daily.    [provider]  Multiple Vitamin (MULTIVITAMIN WITH MINERALS) TABS tablet Take 1  tablet daily by mouth.    [provider]  pantoprazole (PROTONIX) 40 MG tablet Take 1 tablet (40 mg total) by mouth daily. 01/15/18   Einar Pheasant, MD    Family History Family History  Problem Relation Age of Onset  . Heart disease Father   . Heart disease Brother        s/p CABG  . Colon cancer Neg Hx   . Breast cancer Neg Hx     Social History Social History   Tobacco Use  . Smoking status: Never Smoker  . Smokeless tobacco: Never Used  Substance Use Topics  . Alcohol use: No    Alcohol/week: 0.0 standard drinks  . Drug use: No     Allergies   Penicillins and Penicillin v potassium   Review of Systems Review of Systems   Physical Exam Triage Vital Signs ED Triage Vitals  Enc Vitals Group     BP 08/12/18 1411 (!) 143/84     Pulse Rate 08/12/18 1411 91     Resp 08/12/18 1411 16     Temp 08/12/18 1411 98.3 F (36.8 C)     Temp Source 08/12/18 1411 Oral     SpO2 08/12/18 1411 95 %     Weight 08/12/18 1409 243 lb (110.2 kg)     Height 08/12/18 1409 5\' 9"  (1.753 m)     Head Circumference --      Peak Flow --      Pain Score 08/12/18 1408 10     Pain Loc --      Pain Edu? --      Excl. in [place]? --    No data found.  Updated Vital Signs BP (!) 143/84 (BP Location: Left Arm)   Pulse 91   Temp 98.3 F (36.8 C) (Oral)   Resp 16   Ht 5\' 9"  (1.753 m)   Wt 110.2 kg   SpO2 95%   BMI 35.88 kg/m   Visual Acuity Right Eye Distance:   Left Eye Distance:   Bilateral Distance:    Right Eye Near:   Left Eye Near:    Bilateral Near:     Physical Exam Vitals signs and nursing note reviewed.  Constitutional:      General: She is not in acute distress.    Appearance: She is not toxic-appearing or diaphoretic.  Cardiovascular:     Rate and Rhythm: Normal rate.  Pulmonary:     Effort: Pulmonary effort is normal. No respiratory distress.  Musculoskeletal:     Lumbar back: She exhibits bony tenderness (to sacral area; with ecchymosis) and  swelling. She exhibits normal range of motion, no edema, no deformity, no laceration, no pain and normal pulse.     Comments: Tenderness to palpation over left clavicle  Neurological:     Mental Status:  She is alert.      UC Treatments / Results  Labs (all labs ordered are listed, but only abnormal results are displayed) Labs Reviewed - No data to display  EKG None  Radiology Dg Sacrum/coccyx  Result Date: 08/12/2018 CLINICAL DATA:  [place] a few days ago. Pain and swelling down near tail bone. EXAM: SACRUM AND COCCYX - 2+ VIEW COMPARISON:  None. FINDINGS: Bones are diffusely demineralized. No evidence for pelvic or sacral fracture. SI joints unremarkable. IMPRESSION: Negative. Electronically Signed   By: Misty Stanley M.D.   On: 08/12/2018 16:13   Dg Clavicle Left  Result Date: 08/12/2018 CLINICAL DATA:  Pain after fall EXAM: LEFT CLAVICLE - 2+ VIEWS COMPARISON:  None. FINDINGS: There is no evidence of fracture or other focal bone lesions. Soft tissues are unremarkable. IMPRESSION: Negative. Electronically Signed   By: Dorise Bullion III M.D   On: 08/12/2018 16:11    Procedures Procedures (including critical care time)  Medications Ordered in UC Medications - No data to display  Initial Impression / Assessment and Plan / UC Course  I have reviewed the triage vital signs and the nursing notes.  Pertinent labs & imaging results that were available during my care of the patient were reviewed by me and considered in my medical decision making (see chart for details).      Final Clinical Impressions(s) / UC Diagnoses   Final diagnoses:  Contusion of sacrum, initial encounter  Contusion of left clavicle, initial encounter  Fall in home, subsequent encounter    ED Prescriptions    Medication Sig Dispense Auth. Provider   HYDROcodone-acetaminophen (NORCO/VICODIN) 5-325 MG tablet 1-2 tabs po bid prn 10 tablet Norval Gable, MD      1. x-ray results and diagnosis reviewed  with patient and daughter 2. rx as per orders above; reviewed possible side effects, interactions, risks and benefits  3. Recommend supportive treatment with rest, ice, otc analgesics prn 4. Follow-up prn   Controlled Substance Prescriptions [place] Controlled Substance Registry consulted? Not Applicable   Norval Gable, MD 08/12/18 [phone]

## 2018-08-12 NOTE — ED Triage Notes (Signed)
Patient states that she fell last Wed and was seen in the ED on 6/3.  Patient c/o tailbone pain that is not getting better with the Tramadol.

## 2018-08-12 NOTE — Telephone Encounter (Signed)
Daughter following up from a mychart message sent this am in regards to pt's pain, and the tramadol is not working.  Also pt is constipated. along with nausea.  But she states that could be from the medicine. Dorene Sorrow daughter states they cannot wait for her appt Wed with Dr Nicki Reaper.

## 2018-08-13 ENCOUNTER — Encounter: Payer: Self-pay | Admitting: Emergency Medicine

## 2018-08-13 ENCOUNTER — Ambulatory Visit: Payer: Medicare Other

## 2018-08-13 ENCOUNTER — Other Ambulatory Visit: Payer: Self-pay

## 2018-08-13 ENCOUNTER — Telehealth: Payer: Self-pay | Admitting: Internal Medicine

## 2018-08-13 ENCOUNTER — Emergency Department: Payer: Medicare Other

## 2018-08-13 ENCOUNTER — Emergency Department
Admission: EM | Admit: 2018-08-13 | Discharge: 2018-08-13 | Disposition: A | Discharge status: Home / Self Care | Payer: Medicare Other | Attending: Emergency Medicine | Admitting: Emergency Medicine

## 2018-08-13 DIAGNOSIS — W19XXXD Unspecified fall, subsequent encounter: Secondary | ICD-10-CM

## 2018-08-13 DIAGNOSIS — W01198A Fall on same level from slipping, tripping and stumbling with subsequent striking against other object, initial encounter: Secondary | ICD-10-CM | POA: Diagnosis not present

## 2018-08-13 DIAGNOSIS — S0990XA Unspecified injury of head, initial encounter: Secondary | ICD-10-CM | POA: Diagnosis not present

## 2018-08-13 DIAGNOSIS — Z853 Personal history of malignant neoplasm of breast: Secondary | ICD-10-CM | POA: Insufficient documentation

## 2018-08-13 DIAGNOSIS — Y998 Other external cause status: Secondary | ICD-10-CM | POA: Diagnosis not present

## 2018-08-13 DIAGNOSIS — Y92238 Other place in hospital as the place of occurrence of the external cause: Secondary | ICD-10-CM | POA: Diagnosis not present

## 2018-08-13 DIAGNOSIS — I10 Essential (primary) hypertension: Secondary | ICD-10-CM | POA: Diagnosis not present

## 2018-08-13 DIAGNOSIS — Y9389 Activity, other specified: Secondary | ICD-10-CM | POA: Insufficient documentation

## 2018-08-13 DIAGNOSIS — S99911A Unspecified injury of right ankle, initial encounter: Secondary | ICD-10-CM | POA: Diagnosis not present

## 2018-08-13 DIAGNOSIS — Z79899 Other long term (current) drug therapy: Secondary | ICD-10-CM | POA: Insufficient documentation

## 2018-08-13 DIAGNOSIS — S40011A Contusion of right shoulder, initial encounter: Secondary | ICD-10-CM | POA: Insufficient documentation

## 2018-08-13 DIAGNOSIS — S9001XA Contusion of right ankle, initial encounter: Secondary | ICD-10-CM | POA: Diagnosis not present

## 2018-08-13 DIAGNOSIS — S199XXA Unspecified injury of neck, initial encounter: Secondary | ICD-10-CM | POA: Diagnosis not present

## 2018-08-13 DIAGNOSIS — E119 Type 2 diabetes mellitus without complications: Secondary | ICD-10-CM | POA: Diagnosis not present

## 2018-08-13 DIAGNOSIS — Z7901 Long term (current) use of anticoagulants: Secondary | ICD-10-CM | POA: Insufficient documentation

## 2018-08-13 DIAGNOSIS — R531 Weakness: Secondary | ICD-10-CM

## 2018-08-13 DIAGNOSIS — S4991XA Unspecified injury of right shoulder and upper arm, initial encounter: Secondary | ICD-10-CM | POA: Diagnosis not present

## 2018-08-13 LAB — CBC WITH DIFFERENTIAL/PLATELET
Abs Immature Granulocytes: 0.08 10*3/uL — ABNORMAL HIGH (ref 0.00–0.07)
Basophils Absolute: 0.1 10*3/uL (ref 0.0–0.1)
Basophils Relative: 1 %
Eosinophils Absolute: 0.2 10*3/uL (ref 0.0–0.5)
Eosinophils Relative: 2 %
HCT: 37.8 % (ref 36.0–46.0)
Hemoglobin: 12.4 g/dL (ref 12.0–15.0)
Immature Granulocytes: 1 %
Lymphocytes Relative: 19 %
Lymphs Abs: 1.7 10*3/uL (ref 0.7–4.0)
MCH: 29.6 pg (ref 26.0–34.0)
MCHC: 32.8 g/dL (ref 30.0–36.0)
MCV: 90.2 fL (ref 80.0–100.0)
Monocytes Absolute: 1.6 10*3/uL — ABNORMAL HIGH (ref 0.1–1.0)
Monocytes Relative: 17 %
Neutro Abs: 5.5 10*3/uL (ref 1.7–7.7)
Neutrophils Relative %: 60 %
Platelets: 255 10*3/uL (ref 150–400)
RBC: 4.19 MIL/uL (ref 3.87–5.11)
RDW: 14.7 % (ref 11.5–15.5)
WBC: 9.1 10*3/uL (ref 4.0–10.5)
nRBC: 0 % (ref 0.0–0.2)

## 2018-08-13 LAB — PROTIME-INR
INR: 3.5 — ABNORMAL HIGH (ref 0.8–1.2)
Prothrombin Time: 34.7 seconds — ABNORMAL HIGH (ref 11.4–15.2)

## 2018-08-13 MED ORDER — ONDANSETRON 4 MG PO TBDP
8.0000 mg | ORAL_TABLET | Freq: Once | ORAL | Status: AC
  Administered 2018-08-13: 8 mg via ORAL
  Filled 2018-08-13: qty 2

## 2018-08-13 MED ORDER — HYDROCODONE-ACETAMINOPHEN 5-325 MG PO TABS
1.0000 | ORAL_TABLET | Freq: Once | ORAL | Status: AC
  Administered 2018-08-13: 1 via ORAL
  Filled 2018-08-13: qty 1

## 2018-08-13 MED ORDER — HYDROCODONE-ACETAMINOPHEN 5-325 MG PO TABS
1.0000 | ORAL_TABLET | Freq: Once | ORAL | Status: AC
  Administered 2018-08-13: 1 via ORAL
  Filled 2018-08-13 (×2): qty 1

## 2018-08-13 NOTE — ED Triage Notes (Signed)
Pt reports was coming in for a visit and when she was walking [place] medical [place] she thought she was up far enough and the door hit her walker causing her to fall. Pt states that she already had pain before she fell in her back and hips. Denies increase in pain or LOC.

## 2018-08-13 NOTE — Discharge Instructions (Signed)
Your CT scan shows no brain injury or other acute problems.  X-rays of the right shoulder and right ankle are also negative.  Your INR is

## 2018-08-13 NOTE — Telephone Encounter (Signed)
Advised that we could take care of this at her appt in the morning. They are in ED again right now. Pt fell at [facility] when she went to get CT scan of her head. Also daughter noted that her work was sending over [employer] paper work for [place] to fill out. This is just an fyi for you.

## 2018-08-13 NOTE — Telephone Encounter (Signed)
Order placed for home health referral.  

## 2018-08-13 NOTE — ED Provider Notes (Signed)
[facility] Emergency Department Provider Note ____________________________________________   First MD Initiated Contact with Patient 08/13/18 1121     (approximate)  I have reviewed the triage vital signs and the nursing notes.   HISTORY  Chief Complaint Fall; Back Pain; and Hip Pain    HPI Anita Walker is a 78 y.o. female with PMH as noted below who presents with right shoulder and ankle pain and a head injury after a fall this morning.  The patient was coming to the hospital for a follow-up test and states that her walker got caught in the revolving door, causing her to fall.  The patient states that she hit her right side on the ground and also hit her head.  She did not lose consciousness.  She had no dizziness or lightheadedness prior to the fall.  The patient was already being evaluated for pain related to a fall last week.  She has been taking hydrocodone at home with good relief.  Past Medical History:  Diagnosis Date  . Anemia   . Arthritis   . Atrial fibrillation ([place])   . Breast cancer ([place])    s/p lumpectomy 1992.  s/p chemo and xrt left breast  . Diabetes mellitus ([place])   . Edema    feet/legs  . Gastric ulcer   . GERD (gastroesophageal reflux disease)   . HOH (hard of hearing)    aides  . Hypercholesterolemia   . Hypertension   . Pulmonary emboli [facility])     Patient Active Problem List   Diagnosis Date Noted  . Diabetic polyneuropathy associated with type 2 diabetes mellitus ([place]) 03/18/2017  . Abnormal chest CT 03/18/2017  . Urinary frequency 01/21/2017  . Syncope 11/10/2016  . Right hip pain 10/30/2016  . Cough 10/25/2016  . Health care maintenance 05/23/2014  . Mixed emotional features as adjustment reaction 07/20/2013  . Long term current use of anticoagulant therapy 04/11/2013  . Hypertension 01/01/2012  . Hypercholesteremia 01/01/2012  . History of breast cancer 01/01/2012  . Essential hypertension 01/01/2012   . Pure hypercholesterolemia 01/01/2012  . Type 2 diabetes mellitus without complications ([place]) 54/65/6812    Past Surgical History:  Procedure Laterality Date  . BREAST LUMPECTOMY  03/06/1990   left breast  . CATARACT EXTRACTION W/PHACO Right 12/26/2016   Procedure: CATARACT EXTRACTION PHACO AND INTRAOCULAR LENS PLACEMENT (IOC)-RIGHT DIABETIC;  Surgeon: Birder Robson, MD;  Location: [facility] ORS;  Service: Ophthalmology;  Laterality: Right;  [place] 00:47 AP% 24.5 CDE 11.62 Fluid pack lot # [id number]  . CATARACT EXTRACTION W/PHACO Left 01/23/2017   Procedure: CATARACT EXTRACTION PHACO AND INTRAOCULAR LENS PLACEMENT (IOC);  Surgeon: Birder Robson, MD;  Location: [facility] ORS;  Service: Ophthalmology;  Laterality: Left;  [place] 00:50 AP% 16.1 CDE 8.18 Fluid pack lot #[id number] H    Prior to Admission medications   Medication Sig Start Date End Date Taking? Authorizing Provider  CONTOUR TEST test strip USE TO TEST BLOOD SUGAR ONCE EVERY WEEK; IF DOSING HAS CHANGED PLEASE SUBMIT A NEW RX 11/28/17  Yes Einar Pheasant, MD  diphenhydrAMINE (BENADRYL) 25 mg capsule Take 25 mg at bedtime as needed by mouth for sleep.    Yes [provider]  ferrous sulfate (FEROSUL) 325 (65 FE) MG tablet Take 1 tablet (325 mg total) by mouth daily. 05/23/18  Yes Einar Pheasant, MD  HYDROcodone-acetaminophen (NORCO/VICODIN) 5-325 MG tablet 1-2 tabs po bid prn 08/12/18  Yes Conty, Linward Foster, MD  losartan (COZAAR) 100 MG tablet Take 1 tablet (  100 mg total) by mouth daily. 05/23/18  Yes Einar Pheasant, MD  Multiple Vitamin (MULTIVITAMIN WITH MINERALS) TABS tablet Take 1 tablet daily by mouth.   Yes [provider]  pantoprazole (PROTONIX) 40 MG tablet Take 1 tablet (40 mg total) by mouth daily. Patient taking differently: Take 40 mg by mouth every other day.  01/15/18  Yes Einar Pheasant, MD  simvastatin (ZOCOR) 10 MG tablet TAKE 1 TABLET BY MOUTH EVERY DAY 07/01/18  Yes Einar Pheasant, MD  warfarin (COUMADIN) 2.5  MG tablet Takes 2 tablets (5 mg) on Tuesdays and Thursdays and Saturday and Sunday and 1 tablet (2.5 mg)  all other days of the week Patient taking differently: Takes 2 tablets (5 mg) on Tuesdays and Thursdays and Saturday, and 1 tablet (2.5 mg)  all other days of the week 03/20/18  Yes Einar Pheasant, MD    Allergies Penicillins and Penicillin v potassium  Family History  Problem Relation Age of Onset  . Heart disease Father   . Heart disease Brother        s/p CABG  . Colon cancer Neg Hx   . Breast cancer Neg Hx     Social History Social History   Tobacco Use  . Smoking status: Never Smoker  . Smokeless tobacco: Never Used  Substance Use Topics  . Alcohol use: No    Alcohol/week: 0.0 standard drinks  . Drug use: No    Review of Systems  Constitutional: No fever. Eyes: No redness. ENT: No neck pain. Cardiovascular: Denies chest pain. Respiratory: Denies shortness of breath. Gastrointestinal: Positive for nausea.  No vomiting or abdominal pain. Genitourinary: Negative for flank pain.  Musculoskeletal: Negative for acute back pain. Skin: Negative for rash. Neurological: Negative for headaches, focal weakness or numbness.   ____________________________________________   PHYSICAL EXAM:  VITAL SIGNS: ED Triage Vitals  Enc Vitals Group     BP 08/13/18 1103 (!) 160/78     Pulse Rate 08/13/18 1103 85     Resp 08/13/18 1103 18     Temp 08/13/18 1108 98 F (36.7 C)     Temp Source 08/13/18 1108 Oral     SpO2 08/13/18 1103 97 %     Weight 08/13/18 1103 247 lb (112 kg)     Height 08/13/18 1103 5\' 9"  (1.753 m)     Head Circumference --      Peak Flow --      Pain Score 08/13/18 1103 8     Pain Loc --      Pain Edu? --      Excl. in [place]? --     Constitutional: Alert and oriented.  Somewhat frail-appearing but in no acute distress. Eyes: Conjunctivae are normal.  Head: Atraumatic. Nose: No congestion/rhinnorhea. Mouth/Throat: Mucous membranes are moist.    Neck: Normal range of motion.  No midline cervical spinal tenderness. Cardiovascular: Normal rate, regular rhythm. Good peripheral circulation. Respiratory: Normal respiratory effort.  No retractions.  Gastrointestinal: Soft and nontender. No distention.  Genitourinary: No flank tenderness. Musculoskeletal: No midline thoracic or lumbar spinal tenderness.  Pain on range of motion and tenderness superiorly to the right shoulder with no deformity.  Pain on range of motion of right ankle and tenderness laterally.  Full range of motion at bilateral elbows, hips and knees with no bony tenderness to these areas. Neurologic:  Normal speech and language.  Motor intact in all extremities.  Normal coordination.  No gross focal neurologic deficits are appreciated.  Skin:  Skin  is warm and dry. No rash noted. Psychiatric: Mood and affect are normal. Speech and behavior are normal.  ____________________________________________   LABS (all labs ordered are listed, but only abnormal results are displayed)  Labs Reviewed  CBC WITH DIFFERENTIAL/PLATELET - Abnormal; Notable for the following components:      Result Value   Monocytes Absolute 1.6 (*)    Abs Immature Granulocytes 0.08 (*)    All other components within normal limits  PROTIME-INR - Abnormal; Notable for the following components:   Prothrombin Time 34.7 (*)    INR 3.5 (*)    All other components within normal limits   ____________________________________________  EKG   ____________________________________________  RADIOLOGY  CT head: No ICH CT cervical spine: No acute fracture XR R shoulder: No acute fracture XR R ankle: No acute fracture    ____________________________________________   PROCEDURES  Procedure(s) performed: No  Procedures  Critical Care performed: No ____________________________________________   INITIAL IMPRESSION / ASSESSMENT AND PLAN / ED COURSE  Pertinent labs & imaging results that were  available during my care of the patient were reviewed by me and considered in my medical decision making (see chart for details).  78 year old female with PMH as noted above presents after a mechanical fall from standing height while in the revolving doorway entering the hospital, caused when the door apparently caught on her walker.  I reviewed the past medical records in [place].  The patient was seen in the ED after mechanical fall with left shoulder and right hip pain and negative imaging.  She subsequently went to urgent care yesterday with persistent pain and was switched from tramadol to hydrocodone.  She had an x-ray of the clavicle and sacrum both of which were also negative.  Her primary care provider had ordered a CT head due to some persistent headache and nausea, and this was why she was coming to the hospital today.  On exam the patient is somewhat weak and frail appearing but in no acute distress.  Neuro exam is nonfocal.  She has some pain on range of motion of the right shoulder and right ankle.  There is no midline spinal tenderness.  The remainder of the exam is as described above.  As the patient is on Coumadin and hit her head I will obtain a CT head (also since it was ordered for her to have today anyway).  Due to her age and presence of distracting injuries I will obtain CT C-spine as well.  In addition I will obtain x-rays of the right shoulder and right ankle.  There is no indication for imaging of the spine.  As the patient was also ordered for a CBC and PT/INR to be completed today, I will just get these while she is here.   ----------------------------------------- 1:58 PM on 08/13/2018 -----------------------------------------  INR is therapeutic and the CBC is normal.  The imaging is negative (the reports did not crossover to epic but I reviewed them in PACS).  The patient is stable for discharge home.  I counseled her and her daughter on the results of the work-up and  imaging.  Return precautions given, and they expressed understanding.  ______________________________  Anita Walker was evaluated in Emergency Department on 08/13/2018 for the symptoms described in the history of present illness. She was evaluated in the context of the global COVID-19 pandemic, which necessitated consideration that the patient might be at risk for infection with the SARS-CoV-2 virus that causes COVID-19. Institutional protocols and algorithms  that pertain to the evaluation of patients at risk for COVID-19 are in a state of rapid change based on information released by regulatory bodies including the CDC and federal and state organizations. These policies and algorithms were followed during the patient's care in the ED.   ____________________________________________   FINAL CLINICAL IMPRESSION(S) / ED DIAGNOSES  Final diagnoses:  Injury of head, initial encounter  Contusion of right shoulder, initial encounter  Contusion of right ankle, initial encounter      NEW MEDICATIONS STARTED DURING THIS VISIT:  New Prescriptions   No medications on file     Note:  This document was prepared using Dragon voice recognition software and may include unintentional dictation errors.    Arta Silence, MD 08/13/18 1359

## 2018-08-13 NOTE — Telephone Encounter (Signed)
Copied from [place] [id number]. Topic: General - Other >> Aug 13, 2018  9:28 AM Keene Breath wrote: Reason for CRM: Patient's daughter called to request Home Health therapy for patient.  Daughter stated the patient needs help getting up and moving around and would do better with home health therapy.  Please advise and call back to let her know how she can get this started.  CB# [phone]

## 2018-08-13 NOTE — ED Notes (Signed)
Responded to call bell. Pt reports that she urinated on herself and needed to be cleaned up. Soiled clothes removed and placed in belongings bag. Linens changed, pt cleaned and peri care provided. Pt dressed in disposable brief, covered in fresh blanket and resting comfortably.

## 2018-08-13 NOTE — ED Notes (Addendum)
Small 1 cm superficial abrasion on posterior R elbow and small 2cm edematous contusion just distal to abrasion on posterior R elbow. Pt demonstrates full active RoM of elbow. Pt also complaining of aggravation of chronic low back pain. Pts daughter states she took prescription vicadin for pain at 0730 this morning, prior to fall.

## 2018-08-14 ENCOUNTER — Ambulatory Visit (INDEPENDENT_AMBULATORY_CARE_PROVIDER_SITE_OTHER): Payer: Medicare Other | Admitting: Internal Medicine

## 2018-08-14 ENCOUNTER — Encounter: Payer: Self-pay | Admitting: Internal Medicine

## 2018-08-14 ENCOUNTER — Telehealth: Payer: Self-pay

## 2018-08-14 ENCOUNTER — Other Ambulatory Visit: Payer: Self-pay

## 2018-08-14 DIAGNOSIS — Z1231 Encounter for screening mammogram for malignant neoplasm of breast: Secondary | ICD-10-CM

## 2018-08-14 DIAGNOSIS — E119 Type 2 diabetes mellitus without complications: Secondary | ICD-10-CM

## 2018-08-14 DIAGNOSIS — Z7901 Long term (current) use of anticoagulants: Secondary | ICD-10-CM

## 2018-08-14 DIAGNOSIS — I10 Essential (primary) hypertension: Secondary | ICD-10-CM

## 2018-08-14 DIAGNOSIS — K5900 Constipation, unspecified: Secondary | ICD-10-CM | POA: Diagnosis not present

## 2018-08-14 DIAGNOSIS — R531 Weakness: Secondary | ICD-10-CM

## 2018-08-14 DIAGNOSIS — W19XXXD Unspecified fall, subsequent encounter: Secondary | ICD-10-CM | POA: Diagnosis not present

## 2018-08-14 MED ORDER — FERROUS SULFATE 325 (65 FE) MG PO TABS: 325.0000 mg | ORAL_TABLET | Freq: Every day | ORAL | 2 refills | Status: DC

## 2018-08-14 MED ORDER — HYDROCODONE-ACETAMINOPHEN 5-325 MG PO TABS: 1.0000 | ORAL_TABLET | Freq: Four times a day (QID) | ORAL | 0 refills | Status: DC | PRN

## 2018-08-14 MED ORDER — WARFARIN SODIUM 2.5 MG PO TABS: ORAL_TABLET | ORAL | 2 refills | Status: DC

## 2018-08-14 NOTE — Telephone Encounter (Signed)
Copied from [place] [id number]. Topic: General - Other >> Aug 13, 2018  9:28 AM Keene Breath wrote: Reason for CRM: Patient's daughter called to request Home Health therapy for patient.  Daughter stated the patient needs help getting up and moving around and would do better with home health therapy.  Please advise and call back to let her know how she can get this started.  CB# [phone] >> Aug 13, 2018  4:46 PM Keene Breath wrote: Patient's daughter called again and would like to request skilled nursing care for her mother because after falling, she cannot get up at all without help.  Please advise.  CB# [phone]

## 2018-08-14 NOTE — Telephone Encounter (Signed)
FMLA paperwork printed.

## 2018-08-14 NOTE — Progress Notes (Signed)
[name], female   DOB: 1940/07/15, 78 y.o.   MRN: [id number]   Virtual Visit via video Note  This visit type was conducted due to national recommendations for restrictions regarding the COVID-19 pandemic (e.g. social distancing).  This format is felt to be most appropriate for this patient at this time.  All issues noted in this document were discussed and addressed.  No physical exam was performed (except for noted visual exam findings with Video Visits).   I connected with Anita Walker by a video enabled telemedicine application and verified that I am speaking with the correct person using two identifiers. Location patient: home Location provider: work  Persons participating in the virtual visit: patient, provider and pts daughter and son-n-law.   I discussed the limitations, risks, security and privacy concerns of performing an evaluation and management service by video and the availability of in person appointments.  The patient expressed understanding and agreed to proceed.   Reason for visit: scheduled follow up.   HPI: She was seen in ER 08/07/18 after a fall (lost her balance).  Fell on her right hip.  Had left shoulder pain and right hip pain.  CT head and neck - no acute finding.  08/08/18 - evaluated by Philis Nettle.  Given tramadol for pain.  Bruise on her bottom.  Hip xray - no acute abnormality.  Shoulder xray - no acute abnormality.  Was evaluated in urgent care 08/12/18 secondary to persistent pain in her "tailbone" and collar bone.  Sacrum/coccyx xray negative.  Clavicle xray negative.  Was given hydrocodone.  On 08/13/18 she was returning to the hospital for f/u CT scan.  Her walker caught on revolving door.  She hit her right side and her head.  Had pain in her right shoulder and right ankle.  CT head - no acute abnormality.  CT c-spine - mild cervical degenerative changes.  Xray right shoulder negative and xray of ankle - soft tissue swelling.  Her daughter is  helping her now.  Using her walker. Needs help getting up and getting dressed.  Using a bedside commode.  No headache.  No dizziness or light headedness.  No abdominal pain.  Some constipation.  Taking hydrocodone.  No sob.  No chest pain.  Eating.    ROS: See pertinent positives and negatives per HPI.  Past Medical History:  Diagnosis Date  . Anemia   . Arthritis   . Atrial fibrillation ([place])   . Breast cancer ([place])    s/p lumpectomy 1992.  s/p chemo and xrt left breast  . Diabetes mellitus ([place])   . Edema    feet/legs  . Gastric ulcer   . GERD (gastroesophageal reflux disease)   . HOH (hard of hearing)    aides  . Hypercholesterolemia   . Hypertension   . Pulmonary emboli [facility])     Past Surgical History:  Procedure Laterality Date  . BREAST LUMPECTOMY  03/06/1990   left breast  . CATARACT EXTRACTION W/PHACO Right 12/26/2016   Procedure: CATARACT EXTRACTION PHACO AND INTRAOCULAR LENS PLACEMENT (IOC)-RIGHT DIABETIC;  Surgeon: Birder Robson, MD;  Location: [facility] ORS;  Service: Ophthalmology;  Laterality: Right;  [place] 00:47 AP% 24.5 CDE 11.62 Fluid pack lot # [id number]  . CATARACT EXTRACTION W/PHACO Left 01/23/2017   Procedure: CATARACT EXTRACTION PHACO AND INTRAOCULAR LENS PLACEMENT (IOC);  Surgeon: Birder Robson, MD;  Location: [facility] ORS;  Service: Ophthalmology;  Laterality: Left;  [place] 00:50 AP% 16.1 CDE 8.18 Fluid pack  lot #[id number]    Family History  Problem Relation Age of Onset  . Heart disease Father   . Heart disease Brother        s/p CABG  . Colon cancer Neg Hx   . Breast cancer Neg Hx     SOCIAL HX: reviewed.    Current Outpatient Medications:  .  CONTOUR TEST test strip, USE TO TEST BLOOD SUGAR ONCE EVERY WEEK; IF DOSING HAS CHANGED PLEASE SUBMIT A NEW RX, Disp: 25 each, Rfl: 6 .  diphenhydrAMINE (BENADRYL) 25 mg capsule, Take 25 mg at bedtime as needed by mouth for sleep. , Disp: , Rfl:  .  ferrous sulfate (FEROSUL) 325 (65 FE) MG tablet, Take 1  tablet (325 mg total) by mouth daily., Disp: 90 tablet, Rfl: 2 .  HYDROcodone-acetaminophen (NORCO/VICODIN) 5-325 MG tablet, Take 1 tablet by mouth every 6 (six) hours as needed for moderate pain., Disp: 28 tablet, Rfl: 0 .  losartan (COZAAR) 100 MG tablet, Take 1 tablet (100 mg total) by mouth daily., Disp: 90 tablet, Rfl: 3 .  Multiple Vitamin (MULTIVITAMIN WITH MINERALS) TABS tablet, Take 1 tablet daily by mouth., Disp: , Rfl:  .  pantoprazole (PROTONIX) 40 MG tablet, Take 1 tablet (40 mg total) by mouth daily. (Patient taking differently: Take 40 mg by mouth every other day. ), Disp: 90 tablet, Rfl: 1 .  simvastatin (ZOCOR) 10 MG tablet, TAKE 1 TABLET BY MOUTH EVERY DAY, Disp: 90 tablet, Rfl: 1 .  warfarin (COUMADIN) 2.5 MG tablet, Takes 2 tablets (5 mg) on Tuesdays and Thursdays and Saturday, and 1 tablet (2.5 mg)  all other days of the week, Disp: 142 tablet, Rfl: 2  EXAM:  GENERAL: alert, oriented, appears well and in no acute distress  HEENT: atraumatic, conjunttiva clear, no obvious abnormalities on inspection of external nose and ears  NECK: normal movements of the head and neck  LUNGS: on inspection no signs of respiratory distress, breathing rate appears normal, no obvious gross SOB, gasping or wheezing  CV: no obvious cyanosis  MS: stood up out of her chair with assistance.  Walked up steps and into bathroom.  Walking around her house with walker.    PSYCH/NEURO: pleasant and cooperative, no obvious depression or anxiety, speech and thought processing grossly intact  ASSESSMENT AND PLAN:  Discussed the following assessment and plan:   Essential hypertension Blood pressure has been doing well.  Follow.  Continue current medication regimen.   Long term current use of anticoagulant therapy INR slightly elevated.  Given recent fall, etc, adjust coumadin as outlined.  Instructed her to take 5mg  on Tuesday and Saturday and 2.5mg  all other days.    Type 2 diabetes mellitus  without complications (HCC) Sugars doing well.  No low sugars.  Follow.    Constipation Discussed prune juice and miralax.  Follow.    Fall S/p fall. Large hematoma - buttock and into vaginal area. F/u hgb wnl.  Pain as outlined.  Needs help with ADLs, etc. Will have home health evaluate and treat.  Will need aid, physical therapy and nursing to draw f/u pt/inr.      I discussed the assessment and treatment plan with the patient. The patient was provided an opportunity to ask questions and all were answered. The patient agreed with the plan and demonstrated an understanding of the instructions.   The patient was advised to call back or seek an in-person evaluation if the symptoms worsen or if the condition fails to improve  as anticipated.    Einar Pheasant, MD

## 2018-08-14 NOTE — Telephone Encounter (Signed)
Pt has appt this morning. Will discuss then

## 2018-08-15 ENCOUNTER — Telehealth: Payer: Self-pay

## 2018-08-15 ENCOUNTER — Other Ambulatory Visit: Payer: Medicare Other

## 2018-08-15 ENCOUNTER — Encounter: Payer: Self-pay | Admitting: Internal Medicine

## 2018-08-15 DIAGNOSIS — E1142 Type 2 diabetes mellitus with diabetic polyneuropathy: Secondary | ICD-10-CM | POA: Diagnosis not present

## 2018-08-15 DIAGNOSIS — I10 Essential (primary) hypertension: Secondary | ICD-10-CM | POA: Diagnosis not present

## 2018-08-15 DIAGNOSIS — Z853 Personal history of malignant neoplasm of breast: Secondary | ICD-10-CM | POA: Diagnosis not present

## 2018-08-15 DIAGNOSIS — H9193 Unspecified hearing loss, bilateral: Secondary | ICD-10-CM | POA: Diagnosis not present

## 2018-08-15 DIAGNOSIS — Z7901 Long term (current) use of anticoagulants: Secondary | ICD-10-CM | POA: Diagnosis not present

## 2018-08-15 DIAGNOSIS — D649 Anemia, unspecified: Secondary | ICD-10-CM | POA: Diagnosis not present

## 2018-08-15 DIAGNOSIS — S40012D Contusion of left shoulder, subsequent encounter: Secondary | ICD-10-CM | POA: Diagnosis not present

## 2018-08-15 DIAGNOSIS — S300XXD Contusion of lower back and pelvis, subsequent encounter: Secondary | ICD-10-CM | POA: Diagnosis not present

## 2018-08-15 DIAGNOSIS — R2689 Other abnormalities of gait and mobility: Secondary | ICD-10-CM | POA: Diagnosis not present

## 2018-08-15 DIAGNOSIS — Z86711 Personal history of pulmonary embolism: Secondary | ICD-10-CM | POA: Diagnosis not present

## 2018-08-15 DIAGNOSIS — W19XXXD Unspecified fall, subsequent encounter: Secondary | ICD-10-CM | POA: Diagnosis not present

## 2018-08-15 NOTE — Telephone Encounter (Signed)
Copied from [place] [id number]. Topic: General - Inquiry >> Aug 15, 2018 10:22 AM Scherrie Gerlach wrote: Reason for CRM: ason for CRM: Merrilee Seashore with [facility] states they need virtual visit or face to face in office for the home health referral. he is going to send someone to the home and connect through an [place], but will call and set up tele visit after he speaks with the pt.

## 2018-08-15 NOTE — Addendum Note (Signed)
Addended by: Leeanne Rio on: 08/15/2018 03:12 PM   Modules accepted: Orders

## 2018-08-15 NOTE — Telephone Encounter (Signed)
Spoke with nick and let him know that patient has had visit and will fax over note as soon as I can

## 2018-08-15 NOTE — Telephone Encounter (Signed)
Ok

## 2018-08-15 NOTE — Telephone Encounter (Signed)
Verbal orders were given.   Copied from [place] [phone]. Topic: Quick Communication - Home Health Verbal Orders >> Aug 15, 2018  3:07 PM Virl Axe D wrote: Caller/Agency: [facility] Callback Number: [phone] Requesting OT/PT/Skilled Nursing/Social Work/Speech Therapy: PT Frequency: 2 week 2/ 1 week 2

## 2018-08-16 ENCOUNTER — Ambulatory Visit: Payer: Medicare Other

## 2018-08-18 ENCOUNTER — Encounter: Payer: Self-pay | Admitting: Internal Medicine

## 2018-08-18 DIAGNOSIS — W19XXXA Unspecified fall, initial encounter: Secondary | ICD-10-CM | POA: Insufficient documentation

## 2018-08-18 DIAGNOSIS — K5900 Constipation, unspecified: Secondary | ICD-10-CM | POA: Insufficient documentation

## 2018-08-18 NOTE — Assessment & Plan Note (Signed)
Sugars doing well.  No low sugars.  Follow.

## 2018-08-18 NOTE — Assessment & Plan Note (Signed)
S/p fall. Large hematoma - buttock and into vaginal area. F/u hgb wnl.  Pain as outlined.  Needs help with ADLs, etc. Will have home health evaluate and treat.  Will need aid, physical therapy and nursing to draw f/u pt/inr.

## 2018-08-18 NOTE — Assessment & Plan Note (Signed)
INR slightly elevated.  Given recent fall, etc, adjust coumadin as outlined.  Instructed her to take 5mg  on Tuesday and Saturday and 2.5mg  all other days.

## 2018-08-18 NOTE — Assessment & Plan Note (Signed)
Blood pressure has been doing well.  Follow.  Continue current medication regimen.

## 2018-08-18 NOTE — Assessment & Plan Note (Signed)
Discussed prune juice and miralax.  Follow.

## 2018-08-19 ENCOUNTER — Telehealth: Payer: Self-pay

## 2018-08-19 NOTE — Telephone Encounter (Signed)
Copied from [place] [phone]. Topic: General - Inquiry >> Aug 19, 2018  1:38 PM Virl Axe D wrote: Reason for CRM: Pt's daughter called to follow up on a missed lab appt on 08/15/18. She stated that she requested it be cancelled when pt came in on 08/14/18. She would like to make sure pt is not charged a no show fee. CB# [phone]  I spoke with Lorriane Shire to verify and the pt would not be charged for a cancelled lab appt, I called and informed the daughter of this.  Wessie Shanks,cma

## 2018-08-20 ENCOUNTER — Encounter: Payer: Self-pay | Admitting: Internal Medicine

## 2018-08-20 ENCOUNTER — Other Ambulatory Visit: Payer: Self-pay

## 2018-08-20 ENCOUNTER — Ambulatory Visit (INDEPENDENT_AMBULATORY_CARE_PROVIDER_SITE_OTHER): Payer: Medicare Other | Admitting: Internal Medicine

## 2018-08-20 DIAGNOSIS — I10 Essential (primary) hypertension: Secondary | ICD-10-CM | POA: Diagnosis not present

## 2018-08-20 DIAGNOSIS — E119 Type 2 diabetes mellitus without complications: Secondary | ICD-10-CM | POA: Diagnosis not present

## 2018-08-20 DIAGNOSIS — W19XXXD Unspecified fall, subsequent encounter: Secondary | ICD-10-CM

## 2018-08-20 DIAGNOSIS — H9193 Unspecified hearing loss, bilateral: Secondary | ICD-10-CM | POA: Diagnosis not present

## 2018-08-20 DIAGNOSIS — S300XXD Contusion of lower back and pelvis, subsequent encounter: Secondary | ICD-10-CM | POA: Diagnosis not present

## 2018-08-20 DIAGNOSIS — K5900 Constipation, unspecified: Secondary | ICD-10-CM | POA: Diagnosis not present

## 2018-08-20 DIAGNOSIS — S40012D Contusion of left shoulder, subsequent encounter: Secondary | ICD-10-CM | POA: Diagnosis not present

## 2018-08-20 DIAGNOSIS — R2689 Other abnormalities of gait and mobility: Secondary | ICD-10-CM | POA: Diagnosis not present

## 2018-08-20 DIAGNOSIS — D649 Anemia, unspecified: Secondary | ICD-10-CM | POA: Diagnosis not present

## 2018-08-20 DIAGNOSIS — E1142 Type 2 diabetes mellitus with diabetic polyneuropathy: Secondary | ICD-10-CM | POA: Diagnosis not present

## 2018-08-20 NOTE — Progress Notes (Signed)
[name], female   DOB: October 17, 1940, 78 y.o.   MRN: [id number]   Virtual Visit via telephone Note  This visit type was conducted due to national recommendations for restrictions regarding the COVID-19 pandemic (e.g. social distancing).  This format is felt to be most appropriate for this patient at this time.  All issues noted in this document were discussed and addressed.  No physical exam was performed (except for noted visual exam findings with Video Visits).   I connected with Pricilla Holm [phone] telephone and verified that I am speaking with the correct person using two identifiers. Location patient: home Location provider: work or home office Persons participating in the virtual visit: patient, provider and pts daughter [name].    I discussed the limitations, risks, security and privacy concerns of performing an evaluation and management service by telephone and the availability of in person appointments. The patient expressed understanding and agreed to proceed.   Reason for visit: scheduled follow up.    HPI: Scheduled f/u from a recent fall. Was seen in ER 08/07/18 after a fall.   Had another fall going in to the Hospital - 08/13/18.  Reevaluated in ER.  Xrays and scans unrevealing.  Was evaluated for ER follow up 08/14/18.  See last note.  Her daughter and son-n-law have been staying with her.  Reports she is doing better.  Up and walking more.  Decreased pain.  No requiring hydrocodone now.  Did take tylenol this am.  Denies any increased pain.  No chest pain.  No sob.  Previous nausea.  None in the last 36-48 hours.  Off pain medication now.  Bowels are moving better.  Took miralax and this has helped.  No abdominal pain.  Eating well.  Bruising on chest - resolving.  Persistent hematoma - lower buttock and vaginal area.  No fever.  Overall feels better.  PT coming out today to work with pt.  Arranging home health to elp with ADLs and to draw labs.     ROS: See pertinent  positives and negatives per HPI.  Past Medical History:  Diagnosis Date  . Anemia   . Arthritis   . Atrial fibrillation ([place])   . Breast cancer ([place])    s/p lumpectomy 1992.  s/p chemo and xrt left breast  . Diabetes mellitus ([place])   . Edema    feet/legs  . Gastric ulcer   . GERD (gastroesophageal reflux disease)   . HOH (hard of hearing)    aides  . Hypercholesterolemia   . Hypertension   . Pulmonary emboli [facility])     Past Surgical History:  Procedure Laterality Date  . BREAST LUMPECTOMY  03/06/1990   left breast  . CATARACT EXTRACTION W/PHACO Right 12/26/2016   Procedure: CATARACT EXTRACTION PHACO AND INTRAOCULAR LENS PLACEMENT (IOC)-RIGHT DIABETIC;  Surgeon: Birder Robson, MD;  Location: [facility] ORS;  Service: Ophthalmology;  Laterality: Right;  [place] 00:47 AP% 24.5 CDE 11.62 Fluid pack lot # [id number]  . CATARACT EXTRACTION W/PHACO Left 01/23/2017   Procedure: CATARACT EXTRACTION PHACO AND INTRAOCULAR LENS PLACEMENT (IOC);  Surgeon: Birder Robson, MD;  Location: [facility] ORS;  Service: Ophthalmology;  Laterality: Left;  [place] 00:50 AP% 16.1 CDE 8.18 Fluid pack lot #[id number]    Family History  Problem Relation Age of Onset  . Heart disease Father   . Heart disease Brother        s/p CABG  . Colon cancer Neg Hx   . Breast cancer Neg  Hx     SOCIAL HX: reviewed.    Current Outpatient Medications:  .  CONTOUR TEST test strip, USE TO TEST BLOOD SUGAR ONCE EVERY WEEK; IF DOSING HAS CHANGED PLEASE SUBMIT A NEW RX, Disp: 25 each, Rfl: 6 .  diphenhydrAMINE (BENADRYL) 25 mg capsule, Take 25 mg at bedtime as needed by mouth for sleep. , Disp: , Rfl:  .  ferrous sulfate (FEROSUL) 325 (65 FE) MG tablet, Take 1 tablet (325 mg total) by mouth daily., Disp: 90 tablet, Rfl: 2 .  HYDROcodone-acetaminophen (NORCO/VICODIN) 5-325 MG tablet, Take 1 tablet by mouth every 6 (six) hours as needed for moderate pain., Disp: 28 tablet, Rfl: 0 .  losartan (COZAAR) 100 MG tablet, Take 1 tablet (100  mg total) by mouth daily., Disp: 90 tablet, Rfl: 3 .  Multiple Vitamin (MULTIVITAMIN WITH MINERALS) TABS tablet, Take 1 tablet daily by mouth., Disp: , Rfl:  .  pantoprazole (PROTONIX) 40 MG tablet, Take 1 tablet (40 mg total) by mouth daily. (Patient taking differently: Take 40 mg by mouth every other day. ), Disp: 90 tablet, Rfl: 1 .  simvastatin (ZOCOR) 10 MG tablet, TAKE 1 TABLET BY MOUTH EVERY DAY, Disp: 90 tablet, Rfl: 1 .  warfarin (COUMADIN) 2.5 MG tablet, Takes 2 tablets (5 mg) on Tuesdays and Thursdays and Saturday, and 1 tablet (2.5 mg)  all other days of the week, Disp: 142 tablet, Rfl: 2  EXAM:  GENERAL: alert.  Sounds to be in no acute distress.  Answering questions appropriately.    PSYCH/NEURO: pleasant and cooperative, no obvious depression or anxiety, speech and thought processing grossly intact  ASSESSMENT AND PLAN:  Discussed the following assessment and plan:  Constipation Better now.  Not requiring miralax now.  Off hydrocodone.    Essential hypertension Reports blood pressure has been doing well.  Follow pressures.  Follow metabolic panel.   Fall S/p previous falls.  Large hematoma - buttock and into vaginal area.  Reports no significant change.  Decreased pain.  Not requiring hydrocodone.  Took one tylenol this am.  No pain now.  Ambulating better now - with walker.  Still needs assistance with ADLs, etc.  Physical therapy to start today.  Have home health evaluate for aid to help with ADLs, etc.  Will also have nurse to draw PT/INR and follow up cbc.  Overall improved.  Follow.    Type 2 diabetes mellitus without complications ([place]) Sugars have been doing well.  Follow.      I discussed the assessment and treatment plan with the patient. The patient was provided an opportunity to ask questions and all were answered. The patient agreed with the plan and demonstrated an understanding of the instructions.   The patient was advised to call back or seek an in-person  evaluation if the symptoms worsen or if the condition fails to improve as anticipated.  I provided 15 minutes of non-face-to-face time during this encounter.   Einar Pheasant, MD

## 2018-08-21 ENCOUNTER — Encounter: Payer: Self-pay | Admitting: Internal Medicine

## 2018-08-21 DIAGNOSIS — R2689 Other abnormalities of gait and mobility: Secondary | ICD-10-CM | POA: Diagnosis not present

## 2018-08-21 DIAGNOSIS — E1142 Type 2 diabetes mellitus with diabetic polyneuropathy: Secondary | ICD-10-CM | POA: Diagnosis not present

## 2018-08-21 DIAGNOSIS — S300XXD Contusion of lower back and pelvis, subsequent encounter: Secondary | ICD-10-CM | POA: Diagnosis not present

## 2018-08-21 DIAGNOSIS — H9193 Unspecified hearing loss, bilateral: Secondary | ICD-10-CM | POA: Diagnosis not present

## 2018-08-21 DIAGNOSIS — S40012D Contusion of left shoulder, subsequent encounter: Secondary | ICD-10-CM | POA: Diagnosis not present

## 2018-08-21 DIAGNOSIS — D649 Anemia, unspecified: Secondary | ICD-10-CM | POA: Diagnosis not present

## 2018-08-21 NOTE — Assessment & Plan Note (Signed)
Sugars have been doing well.  Follow.   

## 2018-08-21 NOTE — Assessment & Plan Note (Signed)
S/p previous falls.  Large hematoma - buttock and into vaginal area.  Reports no significant change.  Decreased pain.  Not requiring hydrocodone.  Took one tylenol this am.  No pain now.  Ambulating better now - with walker.  Still needs assistance with ADLs, etc.  Physical therapy to start today.  Have home health evaluate for aid to help with ADLs, etc.  Will also have nurse to draw PT/INR and follow up cbc.  Overall improved.  Follow.

## 2018-08-21 NOTE — Telephone Encounter (Signed)
This message was sent to me today.  This pt is on coumadin.  Recent falls.  Home health is being set up for physical therapy, an aid to help with ADLs and nurse to draw pt/inr and cbc.  I had a visit yesterday with them, no mention of black tarry stool.  If she is having black tarry stool, different from her normal stool on iron, needs to be evaluated.  Let them know that I am not in the office the rest of the week.  Do feel she needs to be evaluated.

## 2018-08-21 NOTE — Telephone Encounter (Signed)
Patient stated the stools are different than from before with iron scheduled patient with NP for tomorrow. To evaluate advised patient any bright RED stools needs to go to ER immediately or any large tarry black stools.

## 2018-08-21 NOTE — Assessment & Plan Note (Signed)
Reports blood pressure has been doing well.  Follow pressures.  Follow metabolic panel.   

## 2018-08-21 NOTE — Telephone Encounter (Signed)
Juliann Pulse talked with pts daughter [name] and in reviewing it appears she has an appt with you 08/22/18. (looks like appt in the office).  We are trying to get home health set up for her.  If you do not mind (if she is in the office), please order cbc and pt/inr.  Thanks

## 2018-08-21 NOTE — Assessment & Plan Note (Signed)
Better now.  Not requiring miralax now.  Off hydrocodone.

## 2018-08-22 ENCOUNTER — Ambulatory Visit (INDEPENDENT_AMBULATORY_CARE_PROVIDER_SITE_OTHER): Payer: Medicare Other | Admitting: Family Medicine

## 2018-08-22 ENCOUNTER — Other Ambulatory Visit: Payer: Self-pay

## 2018-08-22 ENCOUNTER — Encounter: Payer: Self-pay | Admitting: Family Medicine

## 2018-08-22 VITALS — BP 132/74 | HR 73 | Temp 98.2°F | Resp 20 | Ht 68.0 in | Wt 242.8 lb

## 2018-08-22 DIAGNOSIS — K921 Melena: Secondary | ICD-10-CM | POA: Diagnosis not present

## 2018-08-22 DIAGNOSIS — H9193 Unspecified hearing loss, bilateral: Secondary | ICD-10-CM | POA: Diagnosis not present

## 2018-08-22 DIAGNOSIS — D649 Anemia, unspecified: Secondary | ICD-10-CM | POA: Diagnosis not present

## 2018-08-22 DIAGNOSIS — S40012D Contusion of left shoulder, subsequent encounter: Secondary | ICD-10-CM | POA: Diagnosis not present

## 2018-08-22 DIAGNOSIS — E1142 Type 2 diabetes mellitus with diabetic polyneuropathy: Secondary | ICD-10-CM | POA: Diagnosis not present

## 2018-08-22 DIAGNOSIS — S300XXD Contusion of lower back and pelvis, subsequent encounter: Secondary | ICD-10-CM | POA: Diagnosis not present

## 2018-08-22 DIAGNOSIS — R2689 Other abnormalities of gait and mobility: Secondary | ICD-10-CM | POA: Diagnosis not present

## 2018-08-22 LAB — CBC
HCT: 38.2 % (ref 36.0–46.0)
Hemoglobin: 12.4 g/dL (ref 12.0–15.0)
MCHC: 32.6 g/dL (ref 30.0–36.0)
MCV: 92.4 fl (ref 78.0–100.0)
Platelets: 349 10*3/uL (ref 150.0–400.0)
RBC: 4.13 Mil/uL (ref 3.87–5.11)
RDW: 16.2 % — ABNORMAL HIGH (ref 11.5–15.5)
WBC: 8.7 10*3/uL (ref 4.0–10.5)

## 2018-08-22 LAB — PROTIME-INR
INR: 2.5 ratio — ABNORMAL HIGH (ref 0.8–1.0)
Prothrombin Time: 29.1 s — ABNORMAL HIGH (ref 9.6–13.1)

## 2018-08-22 NOTE — Telephone Encounter (Signed)
I will order the CBC and pt/inr  Thanks!  LG

## 2018-08-22 NOTE — Progress Notes (Signed)
Subjective:    [name], female    DOB: Jan 30, 1941, 78 y.o.   MRN: [id number]  HPI   Patient and daughter present to clinic due to concerns over a tarry stool.  Patient has been on iron supplements for years, her stools have always been dark in color however daughter states this stool was more "gooey and tarry than usual" and it was difficult for her daughter to get her mother's bottom clean after the bowel movement.  Patient had been on pain medication last week after a fall.  Patient states this constipated her, so she was taking MiraLAX.  Last time she took MiraLAX was on Sunday, 08/18/2018.  Patient is on Coumadin supplementation.  Patient denies any cough, chest pain, abdominal pain, nausea vomiting or abdominal cramping.  Denies any urinary problems.  Patient does have multiple chronic medical issues and we are currently working to get her set up with home health to better meet her needs in the home.  Patient Active Problem List   Diagnosis Date Noted  . Constipation 08/18/2018  . Fall 08/18/2018  . Diabetic polyneuropathy associated with type 2 diabetes mellitus ([place]) 03/18/2017  . Abnormal chest CT 03/18/2017  . Urinary frequency 01/21/2017  . Syncope 11/10/2016  . Right hip pain 10/30/2016  . Cough 10/25/2016  . Health care maintenance 05/23/2014  . Mixed emotional features as adjustment reaction 07/20/2013  . Long term current use of anticoagulant therapy 04/11/2013  . Hypertension 01/01/2012  . Hypercholesteremia 01/01/2012  . History of breast cancer 01/01/2012  . Essential hypertension 01/01/2012  . Pure hypercholesterolemia 01/01/2012  . Type 2 diabetes mellitus without complications ([place]) 33/54/5625   Social History   Tobacco Use  . Smoking status: Never Smoker  . Smokeless tobacco: Never Used  Substance Use Topics  . Alcohol use: No    Alcohol/week: 0.0 standard drinks   Review of Systems  Constitutional: Negative for chills,  fatigue and fever.  HENT: Negative for congestion, ear pain, sinus pain and sore throat.   Eyes: Negative.   Respiratory: Negative for cough, shortness of breath and wheezing.   Cardiovascular: Negative for chest pain, palpitations and leg swelling.  Gastrointestinal: Negative for abdominal pain, diarrhea, nausea and vomiting. +tarry stool episode Genitourinary: Negative for dysuria, frequency and urgency.  Musculoskeletal: Negative for arthralgias and myalgias.  Skin: Negative for color change, pallor and rash.  Neurological: Negative for syncope, light-headedness and headaches.  Psychiatric/Behavioral: The patient is not nervous/anxious.       Objective:   Physical Exam Vitals signs and nursing note reviewed.  Constitutional:      General: She is not in acute distress.    Appearance: She is not ill-appearing, toxic-appearing or diaphoretic.  HENT:     Head: Normocephalic and atraumatic.  Eyes:     General: No scleral icterus.    Extraocular Movements: Extraocular movements intact.     Conjunctiva/sclera: Conjunctivae normal.  Cardiovascular:     Rate and Rhythm: Normal rate and regular rhythm.     Heart sounds: Normal heart sounds.  Pulmonary:     Effort: Pulmonary effort is normal. No respiratory distress.     Breath sounds: Normal breath sounds.  Abdominal:     General: Bowel sounds are normal. There is no distension.     Palpations: Abdomen is soft. There is no mass.     Tenderness: There is no abdominal tenderness. There is no right CVA tenderness, left CVA tenderness, guarding or rebound.  Skin:    General: Skin is warm and dry.     Comments: Healing bruising from fall last week, bruises appear light yellow  Neurological:     Mental Status: She is alert and oriented to person, place, and time.     Comments: Walks with walker  Psychiatric:        Mood and Affect: Mood normal.        Behavior: Behavior normal.        Thought Content: Thought content normal.         Judgment: Judgment normal.    Vitals:   08/22/18 0849  BP: 132/74  Pulse: 73  Resp: 20  Temp: 98.2 F (36.8 C)  SpO2: 93%      Assessment & Plan:    Tarry stools - we will get CBC and PT/INR to further investigate reports of tarry stools.  Patient's physical exam is unremarkable and she seems to be in no distress.  Her skin color is good and she is walking well with a walker.  Once we have results of blood work back we will better be able to determine next step in plan of care.  Discussed to call office right away if patient develops any symptoms of abdominal pain, more episodes of tarry stools, vomiting/vomiting coffee grounds, cough/shortness of breath, any chest pain.  Patient and daughter will otherwise keep regularly scheduled follow-up as planned.

## 2018-08-22 NOTE — Addendum Note (Signed)
Addended by: Leeanne Rio on: 08/22/2018 09:12 AM   Modules accepted: Orders

## 2018-08-26 DIAGNOSIS — H9193 Unspecified hearing loss, bilateral: Secondary | ICD-10-CM | POA: Diagnosis not present

## 2018-08-26 DIAGNOSIS — E1142 Type 2 diabetes mellitus with diabetic polyneuropathy: Secondary | ICD-10-CM | POA: Diagnosis not present

## 2018-08-26 DIAGNOSIS — D649 Anemia, unspecified: Secondary | ICD-10-CM | POA: Diagnosis not present

## 2018-08-26 DIAGNOSIS — R2689 Other abnormalities of gait and mobility: Secondary | ICD-10-CM | POA: Diagnosis not present

## 2018-08-26 DIAGNOSIS — S40012D Contusion of left shoulder, subsequent encounter: Secondary | ICD-10-CM | POA: Diagnosis not present

## 2018-08-26 DIAGNOSIS — S300XXD Contusion of lower back and pelvis, subsequent encounter: Secondary | ICD-10-CM | POA: Diagnosis not present

## 2018-08-26 NOTE — Telephone Encounter (Signed)
Caller name: Andria Meuse  Relation to pt: daughter Call back number: [phone]    Reason for call:  [place] [facility] has not received FMLA paperwork, please fax # [phone] and attention Dominc Doren Custard, please advise when forms are fax

## 2018-08-27 ENCOUNTER — Encounter: Payer: Self-pay | Admitting: Internal Medicine

## 2018-08-27 ENCOUNTER — Ambulatory Visit: Payer: Medicare Other

## 2018-08-28 DIAGNOSIS — R2689 Other abnormalities of gait and mobility: Secondary | ICD-10-CM | POA: Diagnosis not present

## 2018-08-28 DIAGNOSIS — H9193 Unspecified hearing loss, bilateral: Secondary | ICD-10-CM | POA: Diagnosis not present

## 2018-08-28 DIAGNOSIS — E1142 Type 2 diabetes mellitus with diabetic polyneuropathy: Secondary | ICD-10-CM | POA: Diagnosis not present

## 2018-08-28 DIAGNOSIS — S40012D Contusion of left shoulder, subsequent encounter: Secondary | ICD-10-CM | POA: Diagnosis not present

## 2018-08-28 DIAGNOSIS — D649 Anemia, unspecified: Secondary | ICD-10-CM | POA: Diagnosis not present

## 2018-08-28 DIAGNOSIS — S300XXD Contusion of lower back and pelvis, subsequent encounter: Secondary | ICD-10-CM | POA: Diagnosis not present

## 2018-09-02 ENCOUNTER — Other Ambulatory Visit: Payer: Self-pay

## 2018-09-02 ENCOUNTER — Ambulatory Visit (INDEPENDENT_AMBULATORY_CARE_PROVIDER_SITE_OTHER): Payer: Medicare Other | Admitting: Podiatry

## 2018-09-02 ENCOUNTER — Encounter: Payer: Self-pay | Admitting: Podiatry

## 2018-09-02 DIAGNOSIS — M79674 Pain in right toe(s): Secondary | ICD-10-CM | POA: Diagnosis not present

## 2018-09-02 DIAGNOSIS — D689 Coagulation defect, unspecified: Secondary | ICD-10-CM | POA: Insufficient documentation

## 2018-09-02 DIAGNOSIS — B351 Tinea unguium: Secondary | ICD-10-CM | POA: Diagnosis not present

## 2018-09-02 DIAGNOSIS — E1142 Type 2 diabetes mellitus with diabetic polyneuropathy: Secondary | ICD-10-CM

## 2018-09-02 DIAGNOSIS — M79675 Pain in left toe(s): Secondary | ICD-10-CM | POA: Diagnosis not present

## 2018-09-02 NOTE — Progress Notes (Signed)
Complaint:  Visit Type: Patient returns to my office for continued preventative foot care services. Complaint: Patient states" my nails have grown long and thick and become painful to walk and wear shoes" Patient has been diagnosed with DM with neuropathy. The patient presents for preventative foot care services. No changes to ROS.  Patient is taking coumadin.  Podiatric Exam: Vascular: dorsalis pedis  are palpable bilateral. Posterior tibial pulses are non palpable due to ankle/leg swelling. Capillary return is immediate. Temperature gradient is WNL. Skin turgor WNL  Sensorium: Diminished  Semmes Weinstein monofilament test. Normal tactile sensation bilaterally. Nail Exam: Pt has thick disfigured discolored nails with subungual debris noted bilateral entire nail hallux through fifth toenails.  Pincer hallux nails. Ulcer Exam: There is no evidence of ulcer or pre-ulcerative changes or infection. Orthopedic Exam: Muscle tone and strength are WNL. No limitations in general ROM. No crepitus or effusions noted. Foot type and digits show no abnormalities. Bony prominences are unremarkable. Skin: No Porokeratosis. No infection or ulcers  Diagnosis:  Onychomycosis, , Pain in right toe, pain in left toes  Treatment & Plan Procedures and Treatment: Consent by patient was obtained for treatment procedures. The patient understood the discussion of treatment and procedures well. All questions were answered thoroughly reviewed. Debridement of mycotic and hypertrophic toenails, 1 through 5 bilateral and clearing of subungual debris. No ulceration, no infection noted.  Return Visit-Office Procedure: Patient instructed to return to the office for a follow up visit 10 weeks  for continued evaluation and treatment.    Gardiner Barefoot DPM

## 2018-09-03 DIAGNOSIS — R2689 Other abnormalities of gait and mobility: Secondary | ICD-10-CM | POA: Diagnosis not present

## 2018-09-03 DIAGNOSIS — D649 Anemia, unspecified: Secondary | ICD-10-CM | POA: Diagnosis not present

## 2018-09-03 DIAGNOSIS — H9193 Unspecified hearing loss, bilateral: Secondary | ICD-10-CM | POA: Diagnosis not present

## 2018-09-03 DIAGNOSIS — E1142 Type 2 diabetes mellitus with diabetic polyneuropathy: Secondary | ICD-10-CM | POA: Diagnosis not present

## 2018-09-03 DIAGNOSIS — S300XXD Contusion of lower back and pelvis, subsequent encounter: Secondary | ICD-10-CM | POA: Diagnosis not present

## 2018-09-03 DIAGNOSIS — S40012D Contusion of left shoulder, subsequent encounter: Secondary | ICD-10-CM | POA: Diagnosis not present

## 2018-09-03 NOTE — Telephone Encounter (Signed)
Fax received that paperwork was received but incomplete. Patients daughter is going to call [facility] and see exactly what is needed.

## 2018-09-04 ENCOUNTER — Telehealth: Payer: Self-pay | Admitting: Internal Medicine

## 2018-09-04 NOTE — Telephone Encounter (Signed)
Pt daughter [name] is calling [employer] refax FMLA paperwork back to office on 08-29-2018 and question 2 is incomplete. Please complete resend

## 2018-09-09 ENCOUNTER — Encounter: Payer: Self-pay | Admitting: Internal Medicine

## 2018-09-09 DIAGNOSIS — M545 Low back pain, unspecified: Secondary | ICD-10-CM

## 2018-09-09 NOTE — Telephone Encounter (Signed)
See my chart message

## 2018-09-10 NOTE — Telephone Encounter (Signed)
Order placed for ortho referral.   

## 2018-09-10 NOTE — Telephone Encounter (Signed)
Do they need another visit since they have seen you twice or do you want to refer them to ortho? Also I have talked with [employer] and corrected the FMLA paperwork, I just need your initial so we can fax back

## 2018-09-10 NOTE — Telephone Encounter (Signed)
If persistent pain, probably need f/u with ortho.  Please find out exact pain location and problems currently having - and I can make referral.  Will initial form.

## 2018-09-11 DIAGNOSIS — D649 Anemia, unspecified: Secondary | ICD-10-CM | POA: Diagnosis not present

## 2018-09-11 DIAGNOSIS — S300XXD Contusion of lower back and pelvis, subsequent encounter: Secondary | ICD-10-CM | POA: Diagnosis not present

## 2018-09-11 DIAGNOSIS — S40012D Contusion of left shoulder, subsequent encounter: Secondary | ICD-10-CM | POA: Diagnosis not present

## 2018-09-11 DIAGNOSIS — E1142 Type 2 diabetes mellitus with diabetic polyneuropathy: Secondary | ICD-10-CM | POA: Diagnosis not present

## 2018-09-11 DIAGNOSIS — R2689 Other abnormalities of gait and mobility: Secondary | ICD-10-CM | POA: Diagnosis not present

## 2018-09-11 DIAGNOSIS — H9193 Unspecified hearing loss, bilateral: Secondary | ICD-10-CM | POA: Diagnosis not present

## 2018-09-13 ENCOUNTER — Encounter: Payer: Self-pay | Admitting: Internal Medicine

## 2018-09-20 ENCOUNTER — Other Ambulatory Visit: Payer: Self-pay | Admitting: Sports Medicine

## 2018-09-20 DIAGNOSIS — W010XXA Fall on same level from slipping, tripping and stumbling without subsequent striking against object, initial encounter: Secondary | ICD-10-CM | POA: Diagnosis not present

## 2018-09-20 DIAGNOSIS — G8929 Other chronic pain: Secondary | ICD-10-CM

## 2018-09-20 DIAGNOSIS — W19XXXA Unspecified fall, initial encounter: Secondary | ICD-10-CM

## 2018-09-20 DIAGNOSIS — S32030A Wedge compression fracture of third lumbar vertebra, initial encounter for closed fracture: Secondary | ICD-10-CM | POA: Diagnosis not present

## 2018-09-20 DIAGNOSIS — M5136 Other intervertebral disc degeneration, lumbar region: Secondary | ICD-10-CM | POA: Diagnosis not present

## 2018-09-20 DIAGNOSIS — M47816 Spondylosis without myelopathy or radiculopathy, lumbar region: Secondary | ICD-10-CM | POA: Diagnosis not present

## 2018-09-20 DIAGNOSIS — M545 Low back pain: Secondary | ICD-10-CM | POA: Diagnosis not present

## 2018-09-24 ENCOUNTER — Encounter: Payer: Self-pay | Admitting: Internal Medicine

## 2018-09-27 NOTE — Telephone Encounter (Signed)
Ok to use this diagnosis.

## 2018-09-27 NOTE — Telephone Encounter (Signed)
I have addended this FMLA paperwork multiple times. Pt was diagnosed with the compression fracture per ortho. Can we add this diagnosis or should ortho complete?

## 2018-10-03 ENCOUNTER — Other Ambulatory Visit: Payer: Self-pay

## 2018-10-05 ENCOUNTER — Other Ambulatory Visit: Payer: Self-pay

## 2018-10-05 ENCOUNTER — Ambulatory Visit
Admission: RE | Admit: 2018-10-05 | Discharge: 2018-10-05 | Disposition: A | Discharge status: Home / Self Care | Payer: Medicare Other | Source: Ambulatory Visit | Attending: Sports Medicine | Admitting: Sports Medicine

## 2018-10-05 DIAGNOSIS — M545 Low back pain: Secondary | ICD-10-CM | POA: Insufficient documentation

## 2018-10-05 DIAGNOSIS — W19XXXA Unspecified fall, initial encounter: Secondary | ICD-10-CM | POA: Insufficient documentation

## 2018-10-05 DIAGNOSIS — M5127 Other intervertebral disc displacement, lumbosacral region: Secondary | ICD-10-CM | POA: Insufficient documentation

## 2018-10-05 DIAGNOSIS — M48061 Spinal stenosis, lumbar region without neurogenic claudication: Secondary | ICD-10-CM | POA: Insufficient documentation

## 2018-10-05 DIAGNOSIS — S32030A Wedge compression fracture of third lumbar vertebra, initial encounter for closed fracture: Secondary | ICD-10-CM | POA: Diagnosis not present

## 2018-10-05 DIAGNOSIS — M2578 Osteophyte, vertebrae: Secondary | ICD-10-CM | POA: Insufficient documentation

## 2018-10-05 DIAGNOSIS — G8929 Other chronic pain: Secondary | ICD-10-CM

## 2018-10-07 ENCOUNTER — Other Ambulatory Visit: Payer: Self-pay

## 2018-10-07 ENCOUNTER — Ambulatory Visit (INDEPENDENT_AMBULATORY_CARE_PROVIDER_SITE_OTHER): Payer: Medicare Other | Admitting: Internal Medicine

## 2018-10-07 ENCOUNTER — Encounter: Payer: Self-pay | Admitting: Internal Medicine

## 2018-10-07 ENCOUNTER — Encounter: Payer: Self-pay | Admitting: *Deleted

## 2018-10-07 VITALS — BP 134/68 | HR 90 | Temp 97.7°F | Ht 68.0 in | Wt 247.8 lb

## 2018-10-07 DIAGNOSIS — Y9389 Activity, other specified: Secondary | ICD-10-CM | POA: Insufficient documentation

## 2018-10-07 DIAGNOSIS — K625 Hemorrhage of anus and rectum: Secondary | ICD-10-CM

## 2018-10-07 DIAGNOSIS — I10 Essential (primary) hypertension: Secondary | ICD-10-CM | POA: Insufficient documentation

## 2018-10-07 DIAGNOSIS — W19XXXA Unspecified fall, initial encounter: Secondary | ICD-10-CM | POA: Insufficient documentation

## 2018-10-07 DIAGNOSIS — E7800 Pure hypercholesterolemia, unspecified: Secondary | ICD-10-CM | POA: Diagnosis not present

## 2018-10-07 DIAGNOSIS — Y92009 Unspecified place in unspecified non-institutional (private) residence as the place of occurrence of the external cause: Secondary | ICD-10-CM | POA: Diagnosis not present

## 2018-10-07 DIAGNOSIS — Y999 Unspecified external cause status: Secondary | ICD-10-CM | POA: Insufficient documentation

## 2018-10-07 DIAGNOSIS — S3993XA Unspecified injury of pelvis, initial encounter: Secondary | ICD-10-CM | POA: Diagnosis not present

## 2018-10-07 DIAGNOSIS — Z79899 Other long term (current) drug therapy: Secondary | ICD-10-CM | POA: Diagnosis not present

## 2018-10-07 DIAGNOSIS — Z7901 Long term (current) use of anticoagulants: Secondary | ICD-10-CM

## 2018-10-07 DIAGNOSIS — S32030D Wedge compression fracture of third lumbar vertebra, subsequent encounter for fracture with routine healing: Secondary | ICD-10-CM | POA: Diagnosis not present

## 2018-10-07 DIAGNOSIS — E119 Type 2 diabetes mellitus without complications: Secondary | ICD-10-CM

## 2018-10-07 DIAGNOSIS — M545 Low back pain: Secondary | ICD-10-CM | POA: Diagnosis not present

## 2018-10-07 DIAGNOSIS — Z853 Personal history of malignant neoplasm of breast: Secondary | ICD-10-CM

## 2018-10-07 DIAGNOSIS — R102 Pelvic and perineal pain: Secondary | ICD-10-CM | POA: Insufficient documentation

## 2018-10-07 NOTE — Progress Notes (Signed)
Pre visit review using our clinic review tool, if applicable. No additional management support is needed unless otherwise documented below in the visit note. 

## 2018-10-07 NOTE — Progress Notes (Signed)
[name], female   DOB: Jun 11, 1940, 78 y.o.   MRN: [id number]   Subjective:    [name], female    DOB: 1940/04/02, 78 y.o.   MRN: [id number]  HPI  Patient here for a scheduled physical exam.  Give her recent diagnosis of compression fracture and back pain, she wanted to reschedule her physical.  Recheck today.  She has been having persistent pain after multiple falls.  The first was 08/07/18 and the second 08/13/18.  Is s/p treatment with ice, tylenol, norco and home health physical therapy.  Had persistent pain.  Saw ortho.  Pain located over her bilateral midline low back with radiation to her right buttock and posterior lateral thigh.  Xray revealed compression fracture L3 with DDD.  Given persistent pain, had MRI this weekend.  Planning to f/u with ortho to discuss results and for further treatment.  States other than her back, she reports she is doing relatively well.  Eating.  No chest pain.  No sob.  No acid reflux.  No abdominal pain.  Bowels moving.  On review, she also had a visit with Anita Walker 08/2018 for tarry stools. States has not noticed any blood, etc - since.  Bowels stable.  States sugars doing well.     Past Medical History:  Diagnosis Date  . Anemia   . Arthritis   . Atrial fibrillation ([place])   . Breast cancer ([place])    s/p lumpectomy 1992.  s/p chemo and xrt left breast  . Diabetes mellitus ([place])   . Edema    feet/legs  . Gastric ulcer   . GERD (gastroesophageal reflux disease)   . HOH (hard of hearing)    aides  . Hypercholesterolemia   . Hypertension   . Pulmonary emboli [facility])    Past Surgical History:  Procedure Laterality Date  . BREAST LUMPECTOMY  03/06/1990   left breast  . CATARACT EXTRACTION W/PHACO Right 12/26/2016   Procedure: CATARACT EXTRACTION PHACO AND INTRAOCULAR LENS PLACEMENT (IOC)-RIGHT DIABETIC;  Surgeon: Birder Robson, MD;  Location: [facility] ORS;  Service: Ophthalmology;  Laterality: Right;  [place]  00:47 AP% 24.5 CDE 11.62 Fluid pack lot # [id number]  . CATARACT EXTRACTION W/PHACO Left 01/23/2017   Procedure: CATARACT EXTRACTION PHACO AND INTRAOCULAR LENS PLACEMENT (IOC);  Surgeon: Birder Robson, MD;  Location: [facility] ORS;  Service: Ophthalmology;  Laterality: Left;  [place] 00:50 AP% 16.1 CDE 8.18 Fluid pack lot #[id number]   Family History  Problem Relation Age of Onset  . Heart disease Father   . Heart disease Brother        s/p CABG  . Colon cancer Neg Hx   . Breast cancer Neg Hx    Social History   Socioeconomic History  . Marital status: Widowed    Spouse name: Not on file  . Number of children: Not on file  . Years of education: Not on file  . Highest education level: Not on file  Occupational History  . Not on file  Social Needs  . Financial resource strain: Not hard at all  . Food insecurity    Worry: Never true    Inability: Never true  . Transportation needs    Medical: No    Non-medical: No  Tobacco Use  . Smoking status: Never Smoker  . Smokeless tobacco: Never Used  Substance and Sexual Activity  . Alcohol use: No    Alcohol/week: 0.0 standard drinks  . Drug use: No  .  Sexual activity: Never  Lifestyle  . Physical activity    Days per week: 7 days    Minutes per session: 20 min  . Stress: Not at all  Relationships  . Social Herbalist on phone: Not on file    Gets together: Not on file    Attends religious service: Not on file    Active member of club or organization: Not on file    Attends meetings of clubs or organizations: Not on file    Relationship status: Not on file  Other Topics Concern  . Not on file  Social History Narrative  . Not on file    Outpatient Encounter Medications as of 10/07/2018  Medication Sig  . CONTOUR TEST test strip USE TO TEST BLOOD SUGAR ONCE EVERY WEEK; IF DOSING HAS CHANGED PLEASE SUBMIT A NEW RX  . diphenhydrAMINE (BENADRYL) 25 mg capsule Take 25 mg at bedtime as needed by mouth for sleep.   .  ferrous sulfate (FEROSUL) 325 (65 FE) MG tablet Take 1 tablet (325 mg total) by mouth daily.  Marland Kitchen losartan (COZAAR) 100 MG tablet Take 1 tablet (100 mg total) by mouth daily.  . Multiple Vitamin (MULTIVITAMIN WITH MINERALS) TABS tablet Take 1 tablet daily by mouth.  . pantoprazole (PROTONIX) 40 MG tablet Take 1 tablet (40 mg total) by mouth daily. (Patient taking differently: Take 40 mg by mouth every other day. )  . simvastatin (ZOCOR) 10 MG tablet TAKE 1 TABLET BY MOUTH EVERY DAY  . warfarin (COUMADIN) 2.5 MG tablet Takes 2 tablets (5 mg) on Tuesdays and Thursdays and Saturday, and 1 tablet (2.5 mg)  all other days of the week  . [DISCONTINUED] HYDROcodone-acetaminophen (NORCO/VICODIN) 5-325 MG tablet Take 1 tablet by mouth every 6 (six) hours as needed for moderate pain.   No facility-administered encounter medications on file as of 10/07/2018.     Review of Systems  Constitutional: Negative for appetite change and unexpected weight change.  HENT: Negative for congestion and sinus pressure.   Respiratory: Negative for cough, chest tightness and shortness of breath.   Cardiovascular: Negative for chest pain, palpitations and leg swelling.  Gastrointestinal: Negative for abdominal pain, diarrhea, nausea and vomiting.  Genitourinary: Negative for difficulty urinating and dysuria.  Musculoskeletal: Positive for back pain. Negative for joint swelling.  Skin: Negative for color change and rash.  Neurological: Negative for dizziness, light-headedness and headaches.  Psychiatric/Behavioral: Negative for agitation and dysphoric mood.       Objective:    Physical Exam Constitutional:      General: She is not in acute distress.    Appearance: Normal appearance.  HENT:     Right Ear: External ear normal.     Left Ear: External ear normal.  Eyes:     General: No scleral icterus.       Right eye: No discharge.        Left eye: No discharge.     Conjunctiva/sclera: Conjunctivae normal.  Neck:      Musculoskeletal: Neck supple. No muscular tenderness.     Thyroid: No thyromegaly.  Cardiovascular:     Rate and Rhythm: Normal rate and regular rhythm.  Pulmonary:     Effort: No respiratory distress.     Breath sounds: Normal breath sounds. No wheezing.  Abdominal:     General: Bowel sounds are normal.     Palpations: Abdomen is soft.     Tenderness: There is no abdominal tenderness.  Musculoskeletal:  General: No tenderness.     Comments: No increased lower extremity swelling.    Lymphadenopathy:     Cervical: No cervical adenopathy.  Skin:    Findings: No erythema or rash.  Neurological:     Mental Status: She is alert.  Psychiatric:        Mood and Affect: Mood normal.        Behavior: Behavior normal.     BP 134/68   Pulse 90   Temp 97.7 F (36.5 C) (Oral)   Ht '5\' 8"'  (1.727 m)   Wt 247 lb 12.8 oz (112.4 kg)   SpO2 93%   BMI 37.68 kg/m  Wt Readings from Last 3 Encounters:  10/10/18 248 lb 3.2 oz (112.6 kg)  10/07/18 246 lb (111.6 kg)  10/07/18 247 lb 12.8 oz (112.4 kg)     Lab Results  Component Value Date   WBC 6.5 10/07/2018   HGB 13.1 10/07/2018   HCT 39.7 10/07/2018   PLT 228.0 10/07/2018   GLUCOSE 102 (H) 10/07/2018   CHOL 146 06/05/2018   TRIG 99.0 06/05/2018   HDL 49.40 06/05/2018   LDLCALC 77 06/05/2018   ALT 13 10/07/2018   AST 16 10/07/2018   NA 140 10/07/2018   K 4.2 10/07/2018   CL 104 10/07/2018   CREATININE 0.84 10/07/2018   BUN 14 10/07/2018   CO2 28 10/07/2018   TSH 4.09 11/14/2017   INR 2.0 (H) 10/07/2018   HGBA1C 5.4 10/07/2018   MICROALBUR <0.7 11/14/2017    Mr Lumbar Spine Wo Contrast  Result Date: 10/05/2018 CLINICAL DATA:  Chronic low back pain.  Fell on 08/08/2018. EXAM: MRI LUMBAR SPINE WITHOUT CONTRAST TECHNIQUE: Multiplanar, multisequence MR imaging of the lumbar spine was performed. No intravenous contrast was administered. COMPARISON:  Lumbar radiographs from 2013. FINDINGS: Segmentation: There are five  lumbar type vertebral bodies. The last full intervertebral disc space is labeled L5-S1. Alignment:  Normal Vertebrae: Remote L3 anterior wedge like compression fracture with mild/minimal retropulsion. There is a subtle superior endplate compression fracture of T11 which is likely subacute. No significant compression deformity or retropulsion. Conus medullaris and cauda equina: Conus extends to the L1-2 level. Conus and cauda equina appear normal. Paraspinal and other soft tissues: No significant paraspinal or retroperitoneal findings. Small right renal cyst noted. Disc levels: T12-L1: No significant findings. L1-2: No significant findings.  Mild facet disease. L2-3: Bulging annulus and mild retropulsion of the superior posterior aspect of the L3 vertebral body. There is flattening of the ventral thecal sac and mild bilateral lateral recess stenosis. No foraminal stenosis. Early spinal stenosis. L3-4: Mild retropulsion of the posteroinferior aspect of L3 along with a bulging annulus and moderate facet disease contributing to mild bilateral lateral recess stenosis. No significant spinal or foraminal stenosis. L4-5: Moderate facet disease and mild bulging annulus but no significant spinal or foraminal stenosis. L5-S1: Small focal central disc protrusion contacting the ventral thecal sac. No foraminal stenosis. There is a far lateral disc osteophyte complex on the right which could potentially irritate the extraforaminal right L5 nerve root. Moderate to advanced facet disease, left greater than right. IMPRESSION: 1. Remote L3 compression fracture with mild retropulsion. 2. Probable subacute superior endplate fracture of V69. 3. Mild bilateral lateral recess stenosis at L2-3 and L3-4 no significant foraminal stenosis. Early spinal stenosis. 4. Small focal central disc protrusion at L5-S1. 5. Far right lateral disc osteophyte complex at L5-S1 with potential irritation of the extraforaminal right L5 nerve root.  Electronically Signed  By: Marijo Sanes M.D.   On: 10/05/2018 17:15       Assessment & Plan:   Problem List Items Addressed This Visit    Compression fracture of L3 vertebra ([place])    Seeing ortho.  Persistent pain with certain movements.  Just had MRI. Planning to f/u with ortho to discuss further intervention.        Essential hypertension    Blood pressure on my check slightly elevated - 372 systolic.  Have them spot check her pressure.  Send in readings.  Follow metabolic panel.        Relevant Orders   CBC with Differential/Platelet (Completed)   History of breast cancer    S/p lumpectomy.  Mammogram 12/03/17 - Birads I.       Hypercholesteremia - Primary    On simvastatin.  Low cholesterol diet and exercise.  Follow lipid panel and liver function tests.        Relevant Orders   Hepatic function panel (Completed)   Long term current use of anticoagulant therapy    Recheck pt/inr today.        Type 2 diabetes mellitus without complications (HCC)    Low carb diet and exercise.  Follow met b and a1c.        Relevant Orders   Hemoglobin A1c (Completed)   Basic metabolic panel (Completed)    Other Visit Diagnoses    Anticoagulated on Coumadin       Relevant Orders   Protime-INR (Completed)   Rectal bleeding       Relevant Orders   Fecal occult blood, imunochemical (Completed)       Einar Pheasant, MD

## 2018-10-07 NOTE — ED Triage Notes (Signed)
PT to ED via EMS after a fall at home while attempting to shut the door. Pt has no pain at this time and no medical complaints but came via EMS after the fire department checked pts BP to be 180/100. Pt has hx of HTN and is talking medications as prescribed.

## 2018-10-08 ENCOUNTER — Telehealth: Payer: Self-pay | Admitting: Internal Medicine

## 2018-10-08 ENCOUNTER — Emergency Department
Admission: EM | Admit: 2018-10-08 | Discharge: 2018-10-08 | Disposition: A | Discharge status: Home / Self Care | Payer: Medicare Other | Attending: Emergency Medicine | Admitting: Emergency Medicine

## 2018-10-08 ENCOUNTER — Emergency Department: Payer: Medicare Other

## 2018-10-08 DIAGNOSIS — M545 Low back pain, unspecified: Secondary | ICD-10-CM

## 2018-10-08 DIAGNOSIS — W19XXXA Unspecified fall, initial encounter: Secondary | ICD-10-CM

## 2018-10-08 DIAGNOSIS — S3993XA Unspecified injury of pelvis, initial encounter: Secondary | ICD-10-CM | POA: Diagnosis not present

## 2018-10-08 LAB — CBC WITH DIFFERENTIAL/PLATELET
Basophils Absolute: 0.1 10*3/uL (ref 0.0–0.1)
Basophils Relative: 1.1 % (ref 0.0–3.0)
Eosinophils Absolute: 0.1 10*3/uL (ref 0.0–0.7)
Eosinophils Relative: 2 % (ref 0.0–5.0)
HCT: 39.7 % (ref 36.0–46.0)
Hemoglobin: 13.1 g/dL (ref 12.0–15.0)
Lymphocytes Relative: 28 % (ref 12.0–46.0)
Lymphs Abs: 1.8 10*3/uL (ref 0.7–4.0)
MCHC: 33 g/dL (ref 30.0–36.0)
MCV: 92.4 fl (ref 78.0–100.0)
Monocytes Absolute: 1 10*3/uL (ref 0.1–1.0)
Monocytes Relative: 15.9 % — ABNORMAL HIGH (ref 3.0–12.0)
Neutro Abs: 3.4 10*3/uL (ref 1.4–7.7)
Neutrophils Relative %: 53 % (ref 43.0–77.0)
Platelets: 228 10*3/uL (ref 150.0–400.0)
RBC: 4.3 Mil/uL (ref 3.87–5.11)
RDW: 14.6 % (ref 11.5–15.5)
WBC: 6.5 10*3/uL (ref 4.0–10.5)

## 2018-10-08 LAB — BASIC METABOLIC PANEL
BUN: 14 mg/dL (ref 6–23)
CO2: 28 mEq/L (ref 19–32)
Calcium: 10 mg/dL (ref 8.4–10.5)
Chloride: 104 mEq/L (ref 96–112)
Creatinine, Ser: 0.84 mg/dL (ref 0.40–1.20)
GFR: 65.15 mL/min (ref >60.00)
Glucose, Bld: 102 mg/dL — ABNORMAL HIGH (ref 70–99)
Potassium: 4.2 mEq/L (ref 3.5–5.1)
Sodium: 140 mEq/L (ref 135–145)

## 2018-10-08 LAB — PROTIME-INR
INR: 2 — ABNORMAL HIGH
Prothrombin Time: 19.6 s — ABNORMAL HIGH (ref 9.0–11.5)

## 2018-10-08 LAB — HEPATIC FUNCTION PANEL
ALT: 13 U/L (ref 0–35)
AST: 16 U/L (ref 0–37)
Albumin: 4.2 g/dL (ref 3.5–5.2)
Alkaline Phosphatase: 66 U/L (ref 39–117)
Bilirubin, Direct: 0.1 mg/dL (ref 0.0–0.3)
Total Bilirubin: 0.4 mg/dL (ref 0.2–1.2)
Total Protein: 6.5 g/dL (ref 6.0–8.3)

## 2018-10-08 LAB — HEMOGLOBIN A1C: Hgb A1c MFr Bld: 5.4 % (ref 4.6–6.5)

## 2018-10-08 NOTE — Discharge Instructions (Signed)

## 2018-10-08 NOTE — ED Notes (Signed)
Pt c/o neck pain from laying on the stretcher and heat turned up because daughter is cold.

## 2018-10-08 NOTE — Telephone Encounter (Signed)
PT fell last night at home. Went to the ED. ED told her to follow with her provider. When can pt be seen.

## 2018-10-08 NOTE — Telephone Encounter (Signed)
Do you mind calling her.  She was seen in the office yesterday for f/u.  She is seeing ortho.  They just ordered an MRI to f/u on compression fracture and they informed me they were waiting to hear about f/u regarding her back.  She apparently fell last night.  Went back to the ER.  ER stated EMS brought her in because blood pressure was elevated.  xrays last night - no change.  ER felt things were stable.  I do not mind seeing her if needs to be seen, but I do feel she needs to f/u with ortho as planned regarding the compression fracture.  Confirm blood pressure doing ok.  If needs to be seen, I can work her in Thursday at 12:00.

## 2018-10-08 NOTE — Telephone Encounter (Signed)
Patient concerned about BP reading at hospital wanted follow up with PCP to recheck BP on Thursday, when patient arrived at ER BP 180/100 when she left 165/63 per patient still higher than in office . Scheduled for Thursday.

## 2018-10-08 NOTE — ED Provider Notes (Signed)
[facility] Emergency Department Provider Note  ____________________________________________   First MD Initiated Contact with Patient 10/08/18 0138     (approximate)  I have reviewed the triage vital signs and the nursing notes.   HISTORY  Chief Complaint Fall    HPI Anita Walker is a 78 y.o. female with medical history as listed below and who takes warfarin for anticoagulation.  She presents by EMS for evaluation after a fall.  She reports that she was standing at the door looking out of the top step backwards and somehow fell down.  She did not lose consciousness and did not strike her head.  She denies headache and neck pain.  She also denies chest pain, shortness of breath, cough, nausea, vomiting, abdominal pain, and any pain in her arms or her legs.  She does have some back pain and her daughter, who is at bedside, confirms that she had a recent diagnosis of 1 or more compression fractures in her lumbar spine.  She has no skin injuries of which she is aware and has not been bleeding.  EMS came to her house to check her out and brought her to the ED not because of her fall but because she had a blood pressure reading of 180/100.  The patient does have a diagnosis of hypertension and has been taking her medication.  She is currently in no distress but reports that her lower back hurts worse than usual on the right side.  She has no numbness nor tingling in her extremities.  Her daughter says that she used a walker for a while (this is her third fall over the last couple of months) and that she wishes she would use it at home, but the patient refused to do so.  Onset of the fall was acute and symptoms are mild to moderate.  Nothing in particular makes them better and she feels little bit worse when she moves around.         Past Medical History:  Diagnosis Date  . Anemia   . Arthritis   . Atrial fibrillation ([place])   . Breast cancer ([place])    s/p  lumpectomy 1992.  s/p chemo and xrt left breast  . Diabetes mellitus ([place])   . Edema    feet/legs  . Gastric ulcer   . GERD (gastroesophageal reflux disease)   . HOH (hard of hearing)    aides  . Hypercholesterolemia   . Hypertension   . Pulmonary emboli [facility])     Patient Active Problem List   Diagnosis Date Noted  . Pain due to onychomycosis of toenails of both feet 09/02/2018  . Coagulation disorder ([place]) 09/02/2018  . Constipation 08/18/2018  . Fall 08/18/2018  . Diabetic polyneuropathy associated with type 2 diabetes mellitus ([place]) 03/18/2017  . Abnormal chest CT 03/18/2017  . Urinary frequency 01/21/2017  . Syncope 11/10/2016  . Right hip pain 10/30/2016  . Cough 10/25/2016  . Health care maintenance 05/23/2014  . Mixed emotional features as adjustment reaction 07/20/2013  . Long term current use of anticoagulant therapy 04/11/2013  . Hypertension 01/01/2012  . Hypercholesteremia 01/01/2012  . History of breast cancer 01/01/2012  . Essential hypertension 01/01/2012  . Pure hypercholesterolemia 01/01/2012  . Type 2 diabetes mellitus without complications ([place]) 76/72/0947    Past Surgical History:  Procedure Laterality Date  . BREAST LUMPECTOMY  03/06/1990   left breast  . CATARACT EXTRACTION W/PHACO Right 12/26/2016   Procedure: CATARACT EXTRACTION  PHACO AND INTRAOCULAR LENS PLACEMENT (IOC)-RIGHT DIABETIC;  Surgeon: Birder Robson, MD;  Location: [facility] ORS;  Service: Ophthalmology;  Laterality: Right;  [place] 00:47 AP% 24.5 CDE 11.62 Fluid pack lot # [id number]  . CATARACT EXTRACTION W/PHACO Left 01/23/2017   Procedure: CATARACT EXTRACTION PHACO AND INTRAOCULAR LENS PLACEMENT (IOC);  Surgeon: Birder Robson, MD;  Location: [facility] ORS;  Service: Ophthalmology;  Laterality: Left;  [place] 00:50 AP% 16.1 CDE 8.18 Fluid pack lot #[id number]    Prior to Admission medications   Medication Sig Start Date End Date Taking? Authorizing Provider  CONTOUR TEST test strip USE TO TEST  BLOOD SUGAR ONCE EVERY WEEK; IF DOSING HAS CHANGED PLEASE SUBMIT A NEW RX 11/28/17   Einar Pheasant, MD  diphenhydrAMINE (BENADRYL) 25 mg capsule Take 25 mg at bedtime as needed by mouth for sleep.     [provider]  ferrous sulfate (FEROSUL) 325 (65 FE) MG tablet Take 1 tablet (325 mg total) by mouth daily. 08/14/18   Einar Pheasant, MD  HYDROcodone-acetaminophen (NORCO/VICODIN) 5-325 MG tablet Take 1 tablet by mouth every 6 (six) hours as needed for moderate pain. 08/14/18   Einar Pheasant, MD  losartan (COZAAR) 100 MG tablet Take 1 tablet (100 mg total) by mouth daily. 05/23/18   Einar Pheasant, MD  Multiple Vitamin (MULTIVITAMIN WITH MINERALS) TABS tablet Take 1 tablet daily by mouth.    [provider]  pantoprazole (PROTONIX) 40 MG tablet Take 1 tablet (40 mg total) by mouth daily. Patient taking differently: Take 40 mg by mouth every other day.  01/15/18   Einar Pheasant, MD  simvastatin (ZOCOR) 10 MG tablet TAKE 1 TABLET BY MOUTH EVERY DAY 07/01/18   Einar Pheasant, MD  warfarin (COUMADIN) 2.5 MG tablet Takes 2 tablets (5 mg) on Tuesdays and Thursdays and Saturday, and 1 tablet (2.5 mg)  all other days of the week 08/14/18   Einar Pheasant, MD    Allergies Penicillins and Penicillin v potassium  Family History  Problem Relation Age of Onset  . Heart disease Father   . Heart disease Brother        s/p CABG  . Colon cancer Neg Hx   . Breast cancer Neg Hx     Social History Social History   Tobacco Use  . Smoking status: Never Smoker  . Smokeless tobacco: Never Used  Substance Use Topics  . Alcohol use: No    Alcohol/week: 0.0 standard drinks  . Drug use: No    Review of Systems Constitutional: No fever/chills Eyes: No visual changes. ENT: No sore throat. Cardiovascular: Denies chest pain. Respiratory: Denies shortness of breath. Gastrointestinal: No abdominal pain.  No nausea, no vomiting.  No diarrhea.  No constipation. Genitourinary: Negative  for dysuria. Musculoskeletal: Acute on chronic lower back pain after fall.  Some pain in the right upper part of her pelvis. Integumentary: Negative for rash. Neurological: Negative for headaches, focal weakness or numbness.   ____________________________________________   PHYSICAL EXAM:  VITAL SIGNS: ED Triage Vitals  Enc Vitals Group     BP 10/07/18 2059 (!) 165/63     Pulse Rate 10/07/18 2059 97     Resp 10/07/18 2059 16     Temp 10/07/18 2059 97.8 F (36.6 C)     Temp Source 10/07/18 2059 Oral     SpO2 10/07/18 2059 95 %     Weight 10/07/18 2100 111.6 kg (246 lb)     Height 10/07/18 2100 1.727 m (5\' 8" )     Head  Circumference --      Peak Flow --      Pain Score 10/07/18 2100 0     Pain Loc --      Pain Edu? --      Excl. in [place]? --     Constitutional: Alert and oriented.  No acute distress. Eyes: Conjunctivae are normal.  Head: Atraumatic.  No evidence of contusion or hematoma noted areas of tenderness on the back of her head. Nose: No congestion/rhinnorhea. Mouth/Throat: Mucous membranes are moist. Neck: No stridor.  No meningeal signs.  No cervical spine tenderness to palpation and no pain with flexion/extension or rotation side to side. Cardiovascular: Normal rate, regular rhythm. Good peripheral circulation. Grossly normal heart sounds. Respiratory: Normal respiratory effort.  No retractions. Gastrointestinal: Soft and nontender. No distention.  Musculoskeletal: Chronic lymphedema bilateral lower extremities without any gross deformities.  No reproducible pain/tenderness with range of motion of the patient's hips, stressing her pelvis, or palpating her lumbar spine.  She has some tenderness to palpation to the paraspinal muscles and the right upper posterior pelvis. Neurologic:  Normal speech and language. No gross focal neurologic deficits are appreciated.  Skin:  Skin is warm, dry and intact. Psychiatric: Mood and affect are normal. Speech and behavior are normal.   ____________________________________________   LABS (all labs ordered are listed, but only abnormal results are displayed)  Labs Reviewed - No data to display ____________________________________________  EKG  No indication for EKG ____________________________________________  RADIOLOGY I, Hinda Kehr, personally viewed and evaluated these images (plain radiographs) as part of my medical decision making, as well as reviewing the written report by the radiologist.  ED MD interpretation:  No acute injuries identified, re-demonstrated chronic L3 compression fracture  Official radiology report(s): Dg Lumbar Spine 2-3 Views  Result Date: 10/08/2018 CLINICAL DATA:  Fall with low back pain EXAM: LUMBAR SPINE - 2-3 VIEW COMPARISON:  None. FINDINGS: There is a chronic compression fracture of L3 with approximately 50% height loss. The other vertebral body heights are maintained. There is multilevel degenerative disc disease, greatest at the lower thoracic and upper lumbar spine. Alignment is normal. IMPRESSION: Unchanged chronic compression fracture of L3. No acute abnormality of the lumbar spine. Electronically Signed   By: Ulyses Jarred M.D.   On: 10/08/2018 03:06   Dg Pelvis 1-2 Views  Result Date: 10/08/2018 CLINICAL DATA:  Fall EXAM: PELVIS - 1-2 VIEW COMPARISON:  None. FINDINGS: There is no evidence of pelvic fracture or diastasis. No pelvic bone lesions are seen. IMPRESSION: Negative. Electronically Signed   By: Ulyses Jarred M.D.   On: 10/08/2018 03:03    ____________________________________________   PROCEDURES   Procedure(s) performed (including Critical Care):  Procedures   ____________________________________________   INITIAL IMPRESSION / MDM / [place] / ED COURSE  As part of my medical decision making, I reviewed the following data within the [place] History obtained from family, Nursing notes reviewed and incorporated, Labs reviewed ,  Old chart reviewed, Radiograph reviewed  and Notes from prior ED visits   Differential diagnosis includes, but is not limited to, mechanical fall, syncope, acute head injury, spinal fracture, pelvic fracture, femoral neck fractures.  The patient is in no distress at this time and is reporting worsening lower back pain.  I discussed the probability that this is musculoskeletal strain and contusion in the setting of her recent falls and her compression fractures and the patient and her daughter would feel more comfortable obtaining x-rays.  I  have ordered x-rays of her lumbar spine and her pelvis I think the probability of an acute injury is low.  She has no tenderness to palpation or pain with range of motion of her hips and she seems to have musculoskeletal tenderness to the right upper part of the pelvis but I doubt fracture.  We discussed imaging of her head and her neck but given that she has no indication that she struck her head and has no tenderness or pain, I will defer imaging.  She saw her primary care doctor yesterday and lab work is pending and the patient and daughter agree that there is no indication to repeat any blood work at this time.  Her blood pressure has come down substantially after the 180/100 measurement at home and there is no indication for lab work for further evaluation of the hypertension.      Clinical Course as of Oct 07 325  Tue Oct 08, 2018  0318 Films are unremarkable with no sign of acute injury to the pelvis and unchanged compression fracture in the lumbar spine.  I updated the patient and her daughter and they understand and agree with the plan for discharge and outpatient follow-up.  I gave my usual customary return precautions.   [CF]    Clinical Course User Index [CF] Hinda Kehr, MD     ____________________________________________  FINAL CLINICAL IMPRESSION(S) / ED DIAGNOSES  Final diagnoses:  Fall, initial encounter  Right-sided low back pain  without sciatica, unspecified chronicity     MEDICATIONS GIVEN DURING THIS VISIT:  Medications - No data to display   ED Discharge Orders    None      *Please note:  Anita Walker was evaluated in Emergency Department on 10/08/2018 for the symptoms described in the history of present illness. She was evaluated in the context of the global COVID-19 pandemic, which necessitated consideration that the patient might be at risk for infection with the SARS-CoV-2 virus that causes COVID-19. Institutional protocols and algorithms that pertain to the evaluation of patients at risk for COVID-19 are in a state of rapid change based on information released by regulatory bodies including the CDC and federal and state organizations. These policies and algorithms were followed during the patient's care in the ED.  Some ED evaluations and interventions may be delayed as a result of limited staffing during the pandemic.*  Note:  This document was prepared using Dragon voice recognition software and may include unintentional dictation errors.   Hinda Kehr, MD 10/08/18 [phone]

## 2018-10-08 NOTE — ED Notes (Addendum)
Pt taken to [facility] via w/c at request; pt able to stand and walk to commode without difficulty; pt is upset over wait time and st "that baby went back before me"; explained to pt purpose of triage and acuity and that no one has gone before her; pt continues to st people have gone before her

## 2018-10-09 ENCOUNTER — Other Ambulatory Visit: Payer: Self-pay | Admitting: Internal Medicine

## 2018-10-09 DIAGNOSIS — Z7901 Long term (current) use of anticoagulants: Secondary | ICD-10-CM

## 2018-10-09 DIAGNOSIS — E7800 Pure hypercholesterolemia, unspecified: Secondary | ICD-10-CM

## 2018-10-09 NOTE — Progress Notes (Signed)
Orders placed for f/u labs.  

## 2018-10-10 ENCOUNTER — Other Ambulatory Visit: Payer: Self-pay

## 2018-10-10 ENCOUNTER — Encounter: Payer: Self-pay | Admitting: Internal Medicine

## 2018-10-10 ENCOUNTER — Ambulatory Visit (INDEPENDENT_AMBULATORY_CARE_PROVIDER_SITE_OTHER): Payer: Medicare Other | Admitting: Internal Medicine

## 2018-10-10 DIAGNOSIS — Z7901 Long term (current) use of anticoagulants: Secondary | ICD-10-CM | POA: Diagnosis not present

## 2018-10-10 DIAGNOSIS — E119 Type 2 diabetes mellitus without complications: Secondary | ICD-10-CM | POA: Diagnosis not present

## 2018-10-10 DIAGNOSIS — E7800 Pure hypercholesterolemia, unspecified: Secondary | ICD-10-CM

## 2018-10-10 DIAGNOSIS — I10 Essential (primary) hypertension: Secondary | ICD-10-CM

## 2018-10-10 DIAGNOSIS — S32030D Wedge compression fracture of third lumbar vertebra, subsequent encounter for fracture with routine healing: Secondary | ICD-10-CM

## 2018-10-10 MED ORDER — AMLODIPINE BESYLATE 2.5 MG PO TABS: 2.5000 mg | ORAL_TABLET | Freq: Every day | ORAL | 2 refills | Status: DC

## 2018-10-10 NOTE — Progress Notes (Signed)
[name], female   DOB: [birth year], 78 y.o.   MRN: [id number]   Subjective:    [name], female    DOB: 1940-04-01, 78 y.o.   MRN: [id number]  HPI  Patient here for ER follow up.  She is accompanied by her daughter.  History obtained from both of them.  She was in ER 10/08/18 after experiencing another fall.  She was standing at the door looking out and turned to lock the door and fell.  Did not lose consciousness.  Did not hit her head.  Has some persistent back pain - compression fracture.  This is not new.  She reports is stable. No new or increased pain.  Blood pressure was elevated in the ER. Here to f/u on her blood pressure.  No headache.  No dizziness. No chest pain or sob.  No abdominal pain.  Bowels moving.  No increased swelling.  Seeing ortho for her back. Due to f/u regarding MRI.     Past Medical History:  Diagnosis Date  . Anemia   . Arthritis   . Atrial fibrillation ([place])   . Breast cancer ([place])    s/p lumpectomy 1992.  s/p chemo and xrt left breast  . Diabetes mellitus ([place])   . Edema    feet/legs  . Gastric ulcer   . GERD (gastroesophageal reflux disease)   . HOH (hard of hearing)    aides  . Hypercholesterolemia   . Hypertension   . Pulmonary emboli [facility])    Past Surgical History:  Procedure Laterality Date  . BREAST LUMPECTOMY  03/06/1990   left breast  . CATARACT EXTRACTION W/PHACO Right 12/26/2016   Procedure: CATARACT EXTRACTION PHACO AND INTRAOCULAR LENS PLACEMENT (IOC)-RIGHT DIABETIC;  Surgeon: Birder Robson, MD;  Location: [facility] ORS;  Service: Ophthalmology;  Laterality: Right;  [place] 00:47 AP% 24.5 CDE 11.62 Fluid pack lot # [id number]  . CATARACT EXTRACTION W/PHACO Left 01/23/2017   Procedure: CATARACT EXTRACTION PHACO AND INTRAOCULAR LENS PLACEMENT (IOC);  Surgeon: Birder Robson, MD;  Location: [facility] ORS;  Service: Ophthalmology;  Laterality: Left;  [place] 00:50 AP% 16.1 CDE 8.18 Fluid pack lot #[id number]   Family History  Problem Relation Age of Onset  . Heart disease Father   . Heart disease Brother        s/p CABG  . Colon cancer Neg Hx   . Breast cancer Neg Hx    Social History   Socioeconomic History  . Marital status: Widowed    Spouse name: Not on file  . Number of children: Not on file  . Years of education: Not on file  . Highest education level: Not on file  Occupational History  . Not on file  Social Needs  . Financial resource strain: Not hard at all  . Food insecurity    Worry: Never true    Inability: Never true  . Transportation needs    Medical: No    Non-medical: No  Tobacco Use  . Smoking status: Never Smoker  . Smokeless tobacco: Never Used  Substance and Sexual Activity  . Alcohol use: No    Alcohol/week: 0.0 standard drinks  . Drug use: No  . Sexual activity: Never  Lifestyle  . Physical activity    Days per week: 7 days    Minutes per session: 20 min  . Stress: Not at all  Relationships  . Social connections    Talks on phone: Not on file  Gets together: Not on file    Attends religious service: Not on file    Active member of club or organization: Not on file    Attends meetings of clubs or organizations: Not on file    Relationship status: Not on file  Other Topics Concern  . Not on file  Social History Narrative  . Not on file    Outpatient Encounter Medications as of 10/10/2018  Medication Sig  . amLODipine (NORVASC) 2.5 MG tablet Take 1 tablet (2.5 mg total) by mouth daily.  . CONTOUR TEST test strip USE TO TEST BLOOD SUGAR ONCE EVERY WEEK; IF DOSING HAS CHANGED PLEASE SUBMIT A NEW RX  . diphenhydrAMINE (BENADRYL) 25 mg capsule Take 25 mg at bedtime as needed by mouth for sleep.   . ferrous sulfate (FEROSUL) 325 (65 FE) MG tablet Take 1 tablet (325 mg total) by mouth daily.  Marland Kitchen losartan (COZAAR) 100 MG tablet Take 1 tablet (100 mg total) by mouth daily.  . Multiple Vitamin (MULTIVITAMIN WITH MINERALS) TABS tablet Take 1 tablet daily  by mouth.  . pantoprazole (PROTONIX) 40 MG tablet Take 1 tablet (40 mg total) by mouth daily. (Patient taking differently: Take 40 mg by mouth every other day. )  . simvastatin (ZOCOR) 10 MG tablet TAKE 1 TABLET BY MOUTH EVERY DAY  . warfarin (COUMADIN) 2.5 MG tablet Takes 2 tablets (5 mg) on Tuesdays and Thursdays and Saturday, and 1 tablet (2.5 mg)  all other days of the week  . [DISCONTINUED] HYDROcodone-acetaminophen (NORCO/VICODIN) 5-325 MG tablet Take 1 tablet by mouth every 6 (six) hours as needed for moderate pain.   No facility-administered encounter medications on file as of 10/10/2018.     Review of Systems  Constitutional: Negative for appetite change and unexpected weight change.  HENT: Negative for congestion and sinus pressure.   Respiratory: Negative for cough, chest tightness and shortness of breath.   Cardiovascular: Negative for chest pain and palpitations.       No increased leg swelling.    Gastrointestinal: Negative for abdominal pain, diarrhea, nausea and vomiting.  Genitourinary: Negative for difficulty urinating and dysuria.  Musculoskeletal: Positive for back pain. Negative for joint swelling and myalgias.  Skin: Negative for color change and rash.  Neurological: Negative for dizziness, light-headedness and headaches.  Psychiatric/Behavioral: Negative for agitation and dysphoric mood.       Objective:    Physical Exam Constitutional:      General: She is not in acute distress.    Appearance: Normal appearance.  HENT:     Right Ear: External ear normal.     Left Ear: External ear normal.  Eyes:     General: No scleral icterus.       Right eye: No discharge.        Left eye: No discharge.     Conjunctiva/sclera: Conjunctivae normal.  Neck:     Musculoskeletal: Neck supple. No muscular tenderness.     Thyroid: No thyromegaly.  Cardiovascular:     Rate and Rhythm: Normal rate and regular rhythm.  Pulmonary:     Effort: No respiratory distress.      Breath sounds: Normal breath sounds. No wheezing.  Abdominal:     General: Bowel sounds are normal.     Palpations: Abdomen is soft.     Tenderness: There is no abdominal tenderness.  Musculoskeletal:        General: No swelling or tenderness.     Comments: No increased swelling.  Lymphadenopathy:     Cervical: No cervical adenopathy.  Skin:    Findings: No erythema or rash.  Neurological:     Mental Status: She is alert.  Psychiatric:        Mood and Affect: Mood normal.        Behavior: Behavior normal.     BP (!) 150/82   Pulse 81   Temp 98.3 F (36.8 C) (Oral)   Resp 16   Wt 248 lb 3.2 oz (112.6 kg)   SpO2 97%   BMI 37.74 kg/m  Wt Readings from Last 3 Encounters:  10/10/18 248 lb 3.2 oz (112.6 kg)  10/07/18 246 lb (111.6 kg)  10/07/18 247 lb 12.8 oz (112.4 kg)    Lab Results  Component Value Date   WBC 6.5 10/07/2018   HGB 13.1 10/07/2018   HCT 39.7 10/07/2018   PLT 228.0 10/07/2018   GLUCOSE 102 (H) 10/07/2018   CHOL 146 06/05/2018   TRIG 99.0 06/05/2018   HDL 49.40 06/05/2018   LDLCALC 77 06/05/2018   ALT 13 10/07/2018   AST 16 10/07/2018   NA 140 10/07/2018   K 4.2 10/07/2018   CL 104 10/07/2018   CREATININE 0.84 10/07/2018   BUN 14 10/07/2018   CO2 28 10/07/2018   TSH 4.09 11/14/2017   INR 2.0 (H) 10/07/2018   HGBA1C 5.4 10/07/2018   MICROALBUR <0.7 11/14/2017    Dg Lumbar Spine 2-3 Views  Result Date: 10/08/2018 CLINICAL DATA:  Fall with low back pain EXAM: LUMBAR SPINE - 2-3 VIEW COMPARISON:  None. FINDINGS: There is a chronic compression fracture of L3 with approximately 50% height loss. The other vertebral body heights are maintained. There is multilevel degenerative disc disease, greatest at the lower thoracic and upper lumbar spine. Alignment is normal. IMPRESSION: Unchanged chronic compression fracture of L3. No acute abnormality of the lumbar spine. Electronically Signed   By: Ulyses Jarred M.D.   On: 10/08/2018 03:06   Dg Pelvis 1-2  Views  Result Date: 10/08/2018 CLINICAL DATA:  Fall EXAM: PELVIS - 1-2 VIEW COMPARISON:  None. FINDINGS: There is no evidence of pelvic fracture or diastasis. No pelvic bone lesions are seen. IMPRESSION: Negative. Electronically Signed   By: Ulyses Jarred M.D.   On: 10/08/2018 03:03       Assessment & Plan:   Problem List Items Addressed This Visit    Compression fracture of L3 vertebra ([place])    Seeing ortho.  Persistent pain with certain movements.  Due to f/u with ortho regarding MRI report.         Essential hypertension    Blood pressure remains elevated.  Recheck today 148/78-80.  Add amlodipine 2.56m q day.  Follow pressures.  Follow metabolic panel.        Relevant Medications   amLODipine (NORVASC) 2.5 MG tablet   Hypercholesteremia    On simvastatin.  Low cholesterol diet and exercise.  Follow lipid panel and liver function tests.        Relevant Medications   amLODipine (NORVASC) 2.5 MG tablet   Long term current use of anticoagulant therapy    Recent pt/inr wnl.        Type 2 diabetes mellitus without complications (HCC)    Low carb diet and exercise.  Follow met b and a1c.            CEinar Pheasant MD

## 2018-10-10 NOTE — Telephone Encounter (Signed)
Discussed paperwork with patient and her daughter today

## 2018-10-11 ENCOUNTER — Other Ambulatory Visit (INDEPENDENT_AMBULATORY_CARE_PROVIDER_SITE_OTHER): Payer: Medicare Other

## 2018-10-11 DIAGNOSIS — K625 Hemorrhage of anus and rectum: Secondary | ICD-10-CM | POA: Diagnosis not present

## 2018-10-11 LAB — FECAL OCCULT BLOOD, IMMUNOCHEMICAL: Fecal Occult Bld: NEGATIVE

## 2018-10-12 ENCOUNTER — Encounter: Payer: Self-pay | Admitting: Internal Medicine

## 2018-10-12 ENCOUNTER — Other Ambulatory Visit: Payer: Self-pay | Admitting: Internal Medicine

## 2018-10-12 DIAGNOSIS — S22000A Wedge compression fracture of unspecified thoracic vertebra, initial encounter for closed fracture: Secondary | ICD-10-CM | POA: Insufficient documentation

## 2018-10-12 NOTE — Assessment & Plan Note (Signed)
S/p lumpectomy.  Mammogram 12/03/17 - Birads I.

## 2018-10-12 NOTE — Assessment & Plan Note (Signed)
Low carb diet and exercise.  Follow met b and a1c.   

## 2018-10-12 NOTE — Assessment & Plan Note (Signed)
On simvastatin.  Low cholesterol diet and exercise.  Follow lipid panel and liver function tests.   

## 2018-10-12 NOTE — Assessment & Plan Note (Signed)
Seeing ortho.  Persistent pain with certain movements.  Just had MRI. Planning to f/u with ortho to discuss further intervention.

## 2018-10-12 NOTE — Assessment & Plan Note (Signed)
Recheck pt/inr today.

## 2018-10-12 NOTE — Assessment & Plan Note (Signed)
Blood pressure on my check slightly elevated - 683 systolic.  Have them spot check her pressure.  Send in readings.  Follow metabolic panel.

## 2018-10-13 ENCOUNTER — Encounter: Payer: Self-pay | Admitting: Internal Medicine

## 2018-10-13 NOTE — Assessment & Plan Note (Signed)
Recent pt/inr wnl.

## 2018-10-13 NOTE — Assessment & Plan Note (Signed)
Blood pressure remains elevated.  Recheck today 148/78-80.  Add amlodipine 2.5mg  q day.  Follow pressures.  Follow metabolic panel.

## 2018-10-13 NOTE — Assessment & Plan Note (Signed)
Seeing ortho.  Persistent pain with certain movements.  Due to f/u with ortho regarding MRI report.

## 2018-10-13 NOTE — Assessment & Plan Note (Signed)
Low carb diet and exercise.  Follow met b and a1c.   

## 2018-10-13 NOTE — Assessment & Plan Note (Signed)
On simvastatin.  Low cholesterol diet and exercise.  Follow lipid panel and liver function tests.   

## 2018-10-16 DIAGNOSIS — E1142 Type 2 diabetes mellitus with diabetic polyneuropathy: Secondary | ICD-10-CM | POA: Diagnosis not present

## 2018-10-16 DIAGNOSIS — S22080A Wedge compression fracture of T11-T12 vertebra, initial encounter for closed fracture: Secondary | ICD-10-CM | POA: Diagnosis not present

## 2018-10-16 DIAGNOSIS — M47816 Spondylosis without myelopathy or radiculopathy, lumbar region: Secondary | ICD-10-CM | POA: Diagnosis not present

## 2018-10-17 DIAGNOSIS — S22080A Wedge compression fracture of T11-T12 vertebra, initial encounter for closed fracture: Secondary | ICD-10-CM | POA: Diagnosis not present

## 2018-10-17 DIAGNOSIS — M47816 Spondylosis without myelopathy or radiculopathy, lumbar region: Secondary | ICD-10-CM | POA: Diagnosis not present

## 2018-10-23 ENCOUNTER — Ambulatory Visit (INDEPENDENT_AMBULATORY_CARE_PROVIDER_SITE_OTHER): Payer: Medicare Other

## 2018-10-23 ENCOUNTER — Other Ambulatory Visit: Payer: Self-pay

## 2018-10-23 DIAGNOSIS — N3946 Mixed incontinence: Secondary | ICD-10-CM

## 2018-10-23 DIAGNOSIS — M47816 Spondylosis without myelopathy or radiculopathy, lumbar region: Secondary | ICD-10-CM | POA: Diagnosis not present

## 2018-10-23 DIAGNOSIS — S22080A Wedge compression fracture of T11-T12 vertebra, initial encounter for closed fracture: Secondary | ICD-10-CM | POA: Diagnosis not present

## 2018-10-23 NOTE — Progress Notes (Signed)
PTNS  Session # Maintainence  Health & Social Factors: Several falls in the past few months Caffeine: 2 Alcohol: 0 Daytime voids #per day: "unsure" Night-time voids #per night: 2-3 Urgency: Strong Incontinence Episodes #per day: 2 Ankle used: Right Treatment Setting: 19 Feeling/ Response: None Comments: Patient's ankles severely swollen bilaterally. Pitting edema, pt states that her PCP is aware an caring for edema.   Preformed By: Gordy Clement, CMA    Follow Up: 1 Month

## 2018-10-25 DIAGNOSIS — S22080A Wedge compression fracture of T11-T12 vertebra, initial encounter for closed fracture: Secondary | ICD-10-CM | POA: Diagnosis not present

## 2018-10-25 DIAGNOSIS — M47816 Spondylosis without myelopathy or radiculopathy, lumbar region: Secondary | ICD-10-CM | POA: Diagnosis not present

## 2018-10-30 DIAGNOSIS — S22080A Wedge compression fracture of T11-T12 vertebra, initial encounter for closed fracture: Secondary | ICD-10-CM | POA: Diagnosis not present

## 2018-10-30 DIAGNOSIS — M47816 Spondylosis without myelopathy or radiculopathy, lumbar region: Secondary | ICD-10-CM | POA: Diagnosis not present

## 2018-10-31 ENCOUNTER — Encounter: Payer: Self-pay | Admitting: Internal Medicine

## 2018-11-01 ENCOUNTER — Other Ambulatory Visit: Payer: Self-pay

## 2018-11-01 DIAGNOSIS — S22080A Wedge compression fracture of T11-T12 vertebra, initial encounter for closed fracture: Secondary | ICD-10-CM | POA: Diagnosis not present

## 2018-11-01 DIAGNOSIS — M47816 Spondylosis without myelopathy or radiculopathy, lumbar region: Secondary | ICD-10-CM | POA: Diagnosis not present

## 2018-11-01 MED ORDER — SIMVASTATIN 10 MG PO TABS: ORAL_TABLET | ORAL | 1 refills | Status: DC

## 2018-11-06 DIAGNOSIS — S22080A Wedge compression fracture of T11-T12 vertebra, initial encounter for closed fracture: Secondary | ICD-10-CM | POA: Diagnosis not present

## 2018-11-06 DIAGNOSIS — M47816 Spondylosis without myelopathy or radiculopathy, lumbar region: Secondary | ICD-10-CM | POA: Diagnosis not present

## 2018-11-08 DIAGNOSIS — S22080A Wedge compression fracture of T11-T12 vertebra, initial encounter for closed fracture: Secondary | ICD-10-CM | POA: Diagnosis not present

## 2018-11-08 DIAGNOSIS — M47816 Spondylosis without myelopathy or radiculopathy, lumbar region: Secondary | ICD-10-CM | POA: Diagnosis not present

## 2018-11-13 DIAGNOSIS — M47816 Spondylosis without myelopathy or radiculopathy, lumbar region: Secondary | ICD-10-CM | POA: Diagnosis not present

## 2018-11-13 DIAGNOSIS — S22080A Wedge compression fracture of T11-T12 vertebra, initial encounter for closed fracture: Secondary | ICD-10-CM | POA: Diagnosis not present

## 2018-11-14 ENCOUNTER — Ambulatory Visit (INDEPENDENT_AMBULATORY_CARE_PROVIDER_SITE_OTHER): Payer: Medicare Other | Admitting: *Deleted

## 2018-11-14 ENCOUNTER — Other Ambulatory Visit: Payer: Self-pay

## 2018-11-14 DIAGNOSIS — N3941 Urge incontinence: Secondary | ICD-10-CM

## 2018-11-14 NOTE — Progress Notes (Signed)
PTNS  Session # Maintenance   Health & Social Factors: No change Caffeine: 2 Alcohol: 0 Daytime voids #per day: 8 Night-time voids #per night: 2 Urgency: mild Incontinence Episodes #per day: 2 Ankle used: Left Treatment Setting: 10 Feeling/ Response: Sensory Comment: Tolerated well  Performed by: Gaspar Cola, CMA   Assistant:Cynthea Zachman, CMA  Follow Up: As scheduled

## 2018-11-18 ENCOUNTER — Other Ambulatory Visit: Payer: Self-pay

## 2018-11-18 ENCOUNTER — Encounter: Payer: Self-pay | Admitting: Podiatry

## 2018-11-18 ENCOUNTER — Ambulatory Visit (INDEPENDENT_AMBULATORY_CARE_PROVIDER_SITE_OTHER): Payer: Medicare Other | Admitting: Podiatry

## 2018-11-18 DIAGNOSIS — E1142 Type 2 diabetes mellitus with diabetic polyneuropathy: Secondary | ICD-10-CM | POA: Diagnosis not present

## 2018-11-18 DIAGNOSIS — M79675 Pain in left toe(s): Secondary | ICD-10-CM | POA: Diagnosis not present

## 2018-11-18 DIAGNOSIS — B351 Tinea unguium: Secondary | ICD-10-CM

## 2018-11-18 DIAGNOSIS — M79674 Pain in right toe(s): Secondary | ICD-10-CM

## 2018-11-18 DIAGNOSIS — D689 Coagulation defect, unspecified: Secondary | ICD-10-CM | POA: Diagnosis not present

## 2018-11-18 NOTE — Progress Notes (Signed)
Complaint:  Visit Type: Patient returns to my office for continued preventative foot care services. Complaint: Patient states" my nails have grown long and thick and become painful to walk and wear shoes" Patient has been diagnosed with DM with neuropathy. The patient presents for preventative foot care services. No changes to ROS.  Patient is taking coumadin.  Podiatric Exam: Vascular: dorsalis pedis  are palpable bilateral. Posterior tibial pulses are non palpable due to ankle/leg swelling. Capillary return is immediate. Temperature gradient is WNL. Skin turgor WNL  Sensorium: Diminished  Semmes Weinstein monofilament test. Normal tactile sensation bilaterally. Nail Exam: Pt has thick disfigured discolored nails with subungual debris noted bilateral entire nail hallux through fifth toenails.  Pincer hallux nails. Ulcer Exam: There is no evidence of ulcer or pre-ulcerative changes or infection. Orthopedic Exam: Muscle tone and strength are WNL. No limitations in general ROM. No crepitus or effusions noted. Foot type and digits show no abnormalities. Bony prominences are unremarkable. Skin: No Porokeratosis. No infection or ulcers  Diagnosis:  Onychomycosis, , Pain in right toe, pain in left toes  Treatment & Plan Procedures and Treatment: Consent by patient was obtained for treatment procedures. The patient understood the discussion of treatment and procedures well. All questions were answered thoroughly reviewed. Debridement of mycotic and hypertrophic toenails, 1 through 5 bilateral and clearing of subungual debris. No ulceration, no infection noted.  Return Visit-Office Procedure: Patient instructed to return to the office for a follow up visit 10 weeks  for continued evaluation and treatment.    Raquell Richer DPM 

## 2018-11-20 DIAGNOSIS — S22080A Wedge compression fracture of T11-T12 vertebra, initial encounter for closed fracture: Secondary | ICD-10-CM | POA: Diagnosis not present

## 2018-11-20 DIAGNOSIS — M47816 Spondylosis without myelopathy or radiculopathy, lumbar region: Secondary | ICD-10-CM | POA: Diagnosis not present

## 2018-11-22 DIAGNOSIS — M47816 Spondylosis without myelopathy or radiculopathy, lumbar region: Secondary | ICD-10-CM | POA: Diagnosis not present

## 2018-11-22 DIAGNOSIS — S22080A Wedge compression fracture of T11-T12 vertebra, initial encounter for closed fracture: Secondary | ICD-10-CM | POA: Diagnosis not present

## 2018-11-27 DIAGNOSIS — M47816 Spondylosis without myelopathy or radiculopathy, lumbar region: Secondary | ICD-10-CM | POA: Diagnosis not present

## 2018-11-27 DIAGNOSIS — S22080A Wedge compression fracture of T11-T12 vertebra, initial encounter for closed fracture: Secondary | ICD-10-CM | POA: Diagnosis not present

## 2018-11-29 DIAGNOSIS — M47816 Spondylosis without myelopathy or radiculopathy, lumbar region: Secondary | ICD-10-CM | POA: Diagnosis not present

## 2018-11-29 DIAGNOSIS — S22080A Wedge compression fracture of T11-T12 vertebra, initial encounter for closed fracture: Secondary | ICD-10-CM | POA: Diagnosis not present

## 2018-12-04 DIAGNOSIS — M47816 Spondylosis without myelopathy or radiculopathy, lumbar region: Secondary | ICD-10-CM | POA: Diagnosis not present

## 2018-12-04 DIAGNOSIS — S22080A Wedge compression fracture of T11-T12 vertebra, initial encounter for closed fracture: Secondary | ICD-10-CM | POA: Diagnosis not present

## 2018-12-05 ENCOUNTER — Ambulatory Visit
Admission: RE | Admit: 2018-12-05 | Discharge: 2018-12-05 | Disposition: A | Discharge status: Home / Self Care | Payer: Medicare Other | Source: Ambulatory Visit | Attending: Internal Medicine | Admitting: Internal Medicine

## 2018-12-05 DIAGNOSIS — Z1231 Encounter for screening mammogram for malignant neoplasm of breast: Secondary | ICD-10-CM | POA: Diagnosis not present

## 2018-12-06 ENCOUNTER — Other Ambulatory Visit: Payer: Self-pay

## 2018-12-06 ENCOUNTER — Ambulatory Visit (INDEPENDENT_AMBULATORY_CARE_PROVIDER_SITE_OTHER): Payer: Medicare Other | Admitting: Family Medicine

## 2018-12-06 DIAGNOSIS — M47816 Spondylosis without myelopathy or radiculopathy, lumbar region: Secondary | ICD-10-CM | POA: Diagnosis not present

## 2018-12-06 DIAGNOSIS — S22080A Wedge compression fracture of T11-T12 vertebra, initial encounter for closed fracture: Secondary | ICD-10-CM | POA: Diagnosis not present

## 2018-12-06 DIAGNOSIS — N3941 Urge incontinence: Secondary | ICD-10-CM | POA: Diagnosis not present

## 2018-12-06 NOTE — Progress Notes (Signed)
PTNS  Session # monthly maintenance   Health & Social Factors: no change Caffeine: 2 Alcohol: 0 Daytime voids #per day: 12 Night-time voids #per night: 2 Urgency: mild Incontinence Episodes #per day: 0 Ankle used: right Treatment Setting: 4 Feeling/ Response: sensory Comments: Patient tolerated well  Preformed By: Elberta Leatherwood, CMA  Follow Up: 1 month

## 2018-12-09 ENCOUNTER — Other Ambulatory Visit: Payer: Self-pay

## 2018-12-09 ENCOUNTER — Ambulatory Visit: Payer: Medicare Other | Admitting: Internal Medicine

## 2018-12-11 ENCOUNTER — Other Ambulatory Visit: Payer: Self-pay

## 2018-12-11 ENCOUNTER — Ambulatory Visit (INDEPENDENT_AMBULATORY_CARE_PROVIDER_SITE_OTHER): Payer: Medicare Other | Admitting: Internal Medicine

## 2018-12-11 VITALS — BP 136/78 | HR 85 | Temp 97.6°F | Resp 16 | Wt 249.0 lb

## 2018-12-11 DIAGNOSIS — S22080A Wedge compression fracture of T11-T12 vertebra, initial encounter for closed fracture: Secondary | ICD-10-CM | POA: Diagnosis not present

## 2018-12-11 DIAGNOSIS — E119 Type 2 diabetes mellitus without complications: Secondary | ICD-10-CM

## 2018-12-11 DIAGNOSIS — E1142 Type 2 diabetes mellitus with diabetic polyneuropathy: Secondary | ICD-10-CM | POA: Diagnosis not present

## 2018-12-11 DIAGNOSIS — I10 Essential (primary) hypertension: Secondary | ICD-10-CM | POA: Diagnosis not present

## 2018-12-11 DIAGNOSIS — S22080D Wedge compression fracture of T11-T12 vertebra, subsequent encounter for fracture with routine healing: Secondary | ICD-10-CM

## 2018-12-11 DIAGNOSIS — E7800 Pure hypercholesterolemia, unspecified: Secondary | ICD-10-CM | POA: Diagnosis not present

## 2018-12-11 DIAGNOSIS — Z23 Encounter for immunization: Secondary | ICD-10-CM | POA: Diagnosis not present

## 2018-12-11 DIAGNOSIS — Z853 Personal history of malignant neoplasm of breast: Secondary | ICD-10-CM

## 2018-12-11 DIAGNOSIS — Z7901 Long term (current) use of anticoagulants: Secondary | ICD-10-CM | POA: Diagnosis not present

## 2018-12-11 DIAGNOSIS — M47816 Spondylosis without myelopathy or radiculopathy, lumbar region: Secondary | ICD-10-CM | POA: Diagnosis not present

## 2018-12-11 LAB — LIPID PANEL
Cholesterol: 156 mg/dL (ref 0–200)
HDL: 55.9 mg/dL (ref >39.00)
LDL Cholesterol: 80 mg/dL (ref 0–99)
NonHDL: 100.11
Total CHOL/HDL Ratio: 3
Triglycerides: 100 mg/dL (ref 0.0–149.0)
VLDL: 20 mg/dL (ref 0.0–40.0)

## 2018-12-11 LAB — BASIC METABOLIC PANEL
BUN: 16 mg/dL (ref 6–23)
CO2: 28 mEq/L (ref 19–32)
Calcium: 10.2 mg/dL (ref 8.4–10.5)
Chloride: 102 mEq/L (ref 96–112)
Creatinine, Ser: 0.83 mg/dL (ref 0.40–1.20)
GFR: 66.02 mL/min (ref >60.00)
Glucose, Bld: 106 mg/dL — ABNORMAL HIGH (ref 70–99)
Potassium: 4.2 mEq/L (ref 3.5–5.1)
Sodium: 139 mEq/L (ref 135–145)

## 2018-12-11 LAB — PROTIME-INR
INR: 2.1 ratio — ABNORMAL HIGH (ref 0.8–1.0)
Prothrombin Time: 24.3 s — ABNORMAL HIGH (ref 9.6–13.1)

## 2018-12-11 LAB — HM DIABETES FOOT EXAM

## 2018-12-11 NOTE — Progress Notes (Signed)
[name], female   DOB: 03-26-1940, 78 y.o.   MRN: [id number]   Subjective:    [name], female    DOB: March 01, 1941, 78 y.o.   MRN: [id number]  HPI  Patient here for a scheduled follow up.  She reports she is doing better.  Still with some back pain.  Recent compression fracture.  Has seen ortho.  Going to therapy. Does not feel is helping.  Still with some pain.  Able to maneuver around her house - better.  No chest pain.  Breathing stable.  No acid reflux.  No abdominal pain.  Bowels moving.  Recent visit, added amlodipine for elevated blood pressure.  She reports blood pressures doing well.  States sugars doing well.  Brought in no recorded sugars readings, but states she does check and they have looked good.  Discussed diet and exercise.     Past Medical History:  Diagnosis Date  . Anemia   . Arthritis   . Atrial fibrillation ([place])   . Breast cancer ([place])    s/p lumpectomy 1992.  s/p chemo and xrt left breast  . Diabetes mellitus ([place])   . Edema    feet/legs  . Gastric ulcer   . GERD (gastroesophageal reflux disease)   . HOH (hard of hearing)    aides  . Hypercholesterolemia   . Hypertension   . Pulmonary emboli [facility])    Past Surgical History:  Procedure Laterality Date  . BREAST LUMPECTOMY  03/06/1990   left breast  . CATARACT EXTRACTION W/PHACO Right 12/26/2016   Procedure: CATARACT EXTRACTION PHACO AND INTRAOCULAR LENS PLACEMENT (IOC)-RIGHT DIABETIC;  Surgeon: Birder Robson, MD;  Location: [facility] ORS;  Service: Ophthalmology;  Laterality: Right;  [place] 00:47 AP% 24.5 CDE 11.62 Fluid pack lot # [id number]  . CATARACT EXTRACTION W/PHACO Left 01/23/2017   Procedure: CATARACT EXTRACTION PHACO AND INTRAOCULAR LENS PLACEMENT (IOC);  Surgeon: Birder Robson, MD;  Location: [facility] ORS;  Service: Ophthalmology;  Laterality: Left;  [place] 00:50 AP% 16.1 CDE 8.18 Fluid pack lot #[id number]   Family History  Problem Relation Age of Onset  .  Heart disease Father   . Heart disease Brother        s/p CABG  . Colon cancer Neg Hx   . Breast cancer Neg Hx    Social History   Socioeconomic History  . Marital status: Widowed    Spouse name: Not on file  . Number of children: Not on file  . Years of education: Not on file  . Highest education level: Not on file  Occupational History  . Not on file  Social Needs  . Financial resource strain: Not hard at all  . Food insecurity    Worry: Never true    Inability: Never true  . Transportation needs    Medical: No    Non-medical: No  Tobacco Use  . Smoking status: Never Smoker  . Smokeless tobacco: Never Used  Substance and Sexual Activity  . Alcohol use: No    Alcohol/week: 0.0 standard drinks  . Drug use: No  . Sexual activity: Never  Lifestyle  . Physical activity    Days per week: 7 days    Minutes per session: 20 min  . Stress: Not at all  Relationships  . Social Herbalist on phone: Not on file    Gets together: Not on file    Attends religious service: Not on file  Active member of club or organization: Not on file    Attends meetings of clubs or organizations: Not on file    Relationship status: Not on file  Other Topics Concern  . Not on file  Social History Narrative  . Not on file    Outpatient Encounter Medications as of 12/11/2018  Medication Sig  . amLODipine (NORVASC) 2.5 MG tablet Take 1 tablet (2.5 mg total) by mouth daily.  . CONTOUR TEST test strip USE TO TEST BLOOD SUGAR ONCE EVERY WEEK; IF DOSING HAS CHANGED PLEASE SUBMIT A NEW RX  . diphenhydrAMINE (BENADRYL) 25 mg capsule Take 25 mg at bedtime as needed by mouth for sleep.   . ferrous sulfate (FEROSUL) 325 (65 FE) MG tablet Take 1 tablet (325 mg total) by mouth daily.  Marland Kitchen losartan (COZAAR) 100 MG tablet Take 1 tablet (100 mg total) by mouth daily.  . Multiple Vitamin (MULTIVITAMIN WITH MINERALS) TABS tablet Take 1 tablet daily by mouth.  . pantoprazole (PROTONIX) 40 MG tablet  TAKE 1 TABLET(40 MG) BY MOUTH DAILY  . simvastatin (ZOCOR) 10 MG tablet TAKE 1 TABLET BY MOUTH EVERY DAY  . warfarin (COUMADIN) 2.5 MG tablet Takes 2 tablets (5 mg) on Tuesdays and Thursdays and Saturday, and 1 tablet (2.5 mg)  all other days of the week   No facility-administered encounter medications on file as of 12/11/2018.    Review of Systems  Constitutional: Negative for appetite change and unexpected weight change.  HENT: Negative for congestion and sinus pressure.   Respiratory: Negative for cough, chest tightness and shortness of breath.   Cardiovascular: Negative for chest pain, palpitations and leg swelling.  Gastrointestinal: Negative for abdominal pain, diarrhea, nausea and vomiting.  Genitourinary: Negative for difficulty urinating and dysuria.  Musculoskeletal: Negative for joint swelling and myalgias.  Skin: Negative for color change and rash.  Neurological: Negative for dizziness, light-headedness and headaches.  Psychiatric/Behavioral: Negative for agitation and dysphoric mood.       Objective:    Physical Exam Constitutional:      General: She is not in acute distress.    Appearance: Normal appearance.  HENT:     Head: Normocephalic and atraumatic.     Right Ear: External ear normal.     Left Ear: External ear normal.  Eyes:     General: No scleral icterus.       Right eye: No discharge.        Left eye: No discharge.     Conjunctiva/sclera: Conjunctivae normal.  Neck:     Musculoskeletal: Neck supple. No muscular tenderness.     Thyroid: No thyromegaly.  Cardiovascular:     Rate and Rhythm: Normal rate and regular rhythm.  Pulmonary:     Effort: No respiratory distress.     Breath sounds: Normal breath sounds. No wheezing.  Abdominal:     General: Bowel sounds are normal.     Palpations: Abdomen is soft.     Tenderness: There is no abdominal tenderness.  Musculoskeletal:        General: No swelling or tenderness.  Lymphadenopathy:     Cervical: No  cervical adenopathy.  Skin:    Findings: No erythema or rash.  Neurological:     Mental Status: She is alert.  Psychiatric:        Mood and Affect: Mood normal.        Behavior: Behavior normal.     BP 136/78   Pulse 85   Temp 97.6 F (36.4  C)   Resp 16   Wt 249 lb (112.9 kg)   SpO2 97%   BMI 37.86 kg/m  Wt Readings from Last 3 Encounters:  12/11/18 249 lb (112.9 kg)  10/10/18 248 lb 3.2 oz (112.6 kg)  10/07/18 246 lb (111.6 kg)     Lab Results  Component Value Date   WBC 6.5 10/07/2018   HGB 13.1 10/07/2018   HCT 39.7 10/07/2018   PLT 228.0 10/07/2018   GLUCOSE 106 (H) 12/11/2018   CHOL 156 12/11/2018   TRIG 100.0 12/11/2018   HDL 55.90 12/11/2018   LDLCALC 80 12/11/2018   ALT 13 10/07/2018   AST 16 10/07/2018   NA 139 12/11/2018   K 4.2 12/11/2018   CL 102 12/11/2018   CREATININE 0.83 12/11/2018   BUN 16 12/11/2018   CO2 28 12/11/2018   TSH 4.09 11/14/2017   INR 2.1 (H) 12/11/2018   HGBA1C 5.4 10/07/2018   MICROALBUR <0.7 11/14/2017    Mm 3d Screen Breast Bilateral  Result Date: 12/05/2018 CLINICAL DATA:  Screening. EXAM: DIGITAL SCREENING BILATERAL MAMMOGRAM WITH TOMO AND CAD COMPARISON:  Previous exam(s). ACR Breast Density Category c: The breast tissue is heterogeneously dense, which may obscure small masses. FINDINGS: There are no findings suspicious for malignancy. Images were processed with CAD. IMPRESSION: No mammographic evidence of malignancy. A result letter of this screening mammogram will be mailed directly to the patient. RECOMMENDATION: Screening mammogram in one year. (Code:SM-B-01Y) BI-RADS CATEGORY  1: Negative. Electronically Signed   By: Lovey Newcomer M.D.   On: 12/05/2018 16:34       Assessment & Plan:   Problem List Items Addressed This Visit    Compression fracture of thoracic vertebra (HCC)    Persistent back pain.  Remote L3 compression fracture and T11 compression fracture (subacute).  Has seen ortho.  Referred to PT.  Still with  pain.  Request referral back to ortho.        Relevant Orders   Ambulatory referral to Orthopedic Surgery   Diabetic polyneuropathy associated with type 2 diabetes mellitus (HCC)    Low carb diet and exercise.  Follow met b and a1c.        Essential hypertension - Primary    Blood pressure doing better since adding amlodipine 2.54m q day.  Follow pressures.  Follow metabolic panel.       Relevant Orders   Basic metabolic panel (Completed)   History of breast cancer    Mammogram 12/05/18 - Birads I.       Hypercholesteremia    On simvastatin.  Low cholesterol diet and exercise.  Follow lipid panel and liver function tests.        Long term current use of anticoagulant therapy   Type 2 diabetes mellitus without complications (HCC)    Low carb diet and exercise.  Follow met b and a1c.   Lab Results  Component Value Date   HGBA1C 5.4 10/07/2018         Other Visit Diagnoses    Need for immunization against influenza       Relevant Orders   Flu Vaccine QUAD High Dose(Fluad) (Completed)       CEinar Pheasant MD

## 2018-12-13 DIAGNOSIS — S22080A Wedge compression fracture of T11-T12 vertebra, initial encounter for closed fracture: Secondary | ICD-10-CM | POA: Diagnosis not present

## 2018-12-13 DIAGNOSIS — M47816 Spondylosis without myelopathy or radiculopathy, lumbar region: Secondary | ICD-10-CM | POA: Diagnosis not present

## 2018-12-15 ENCOUNTER — Encounter: Payer: Self-pay | Admitting: Internal Medicine

## 2018-12-15 NOTE — Assessment & Plan Note (Signed)
Persistent back pain.  Remote L3 compression fracture and T11 compression fracture (subacute).  Has seen ortho.  Referred to PT.  Still with pain.  Request referral back to ortho.

## 2018-12-15 NOTE — Assessment & Plan Note (Signed)
Low carb diet and exercise.  Follow met b and a1c.   

## 2018-12-15 NOTE — Assessment & Plan Note (Signed)
Mammogram 12/05/18 - Birads I.

## 2018-12-15 NOTE — Assessment & Plan Note (Signed)
Low carb diet and exercise.  Follow met b and a1c.   Lab Results  Component Value Date   HGBA1C 5.4 10/07/2018

## 2018-12-15 NOTE — Assessment & Plan Note (Signed)
On simvastatin.  Low cholesterol diet and exercise.  Follow lipid panel and liver function tests.   

## 2018-12-15 NOTE — Assessment & Plan Note (Signed)
Blood pressure doing better since adding amlodipine 2.5mg  q day.  Follow pressures.  Follow metabolic panel.

## 2018-12-20 DIAGNOSIS — M47816 Spondylosis without myelopathy or radiculopathy, lumbar region: Secondary | ICD-10-CM | POA: Diagnosis not present

## 2018-12-26 NOTE — Progress Notes (Signed)
PTNS  Session # monthly maintenance  Health & Social Factors: no change Caffeine: 2 Alcohol: 0 Daytime voids #per day: "lots" Night-time voids #per night: 2 Urgency: mild Incontinence Episodes #per day: 1 Ankle used: right Treatment Setting: 10 Feeling/ Response: sensory Comments: Patient tolerated well, significant BLE edema  Performed By: Debroah Loop, PA-C  Follow Up: 1 month maintenance PTNS

## 2018-12-27 ENCOUNTER — Encounter: Payer: Self-pay | Admitting: Physician Assistant

## 2018-12-27 ENCOUNTER — Other Ambulatory Visit: Payer: Self-pay

## 2018-12-27 ENCOUNTER — Ambulatory Visit (INDEPENDENT_AMBULATORY_CARE_PROVIDER_SITE_OTHER): Payer: Medicare Other | Admitting: Physician Assistant

## 2018-12-27 VITALS — BP 132/74 | HR 84 | Ht 68.0 in | Wt 256.0 lb

## 2018-12-27 DIAGNOSIS — N3941 Urge incontinence: Secondary | ICD-10-CM | POA: Diagnosis not present

## 2018-12-30 DIAGNOSIS — Z7901 Long term (current) use of anticoagulants: Secondary | ICD-10-CM | POA: Diagnosis not present

## 2018-12-31 DIAGNOSIS — M62830 Muscle spasm of back: Secondary | ICD-10-CM | POA: Diagnosis not present

## 2019-01-03 ENCOUNTER — Other Ambulatory Visit: Payer: Self-pay

## 2019-01-07 ENCOUNTER — Other Ambulatory Visit: Payer: Self-pay

## 2019-01-07 ENCOUNTER — Ambulatory Visit (INDEPENDENT_AMBULATORY_CARE_PROVIDER_SITE_OTHER): Payer: Medicare Other | Admitting: Internal Medicine

## 2019-01-07 VITALS — BP 128/78 | HR 96 | Temp 97.7°F | Resp 16 | Ht 68.0 in | Wt 248.0 lb

## 2019-01-07 DIAGNOSIS — E119 Type 2 diabetes mellitus without complications: Secondary | ICD-10-CM

## 2019-01-07 DIAGNOSIS — Z1211 Encounter for screening for malignant neoplasm of colon: Secondary | ICD-10-CM | POA: Diagnosis not present

## 2019-01-07 DIAGNOSIS — I10 Essential (primary) hypertension: Secondary | ICD-10-CM

## 2019-01-07 DIAGNOSIS — S22080D Wedge compression fracture of T11-T12 vertebra, subsequent encounter for fracture with routine healing: Secondary | ICD-10-CM | POA: Diagnosis not present

## 2019-01-07 DIAGNOSIS — E7800 Pure hypercholesterolemia, unspecified: Secondary | ICD-10-CM | POA: Diagnosis not present

## 2019-01-07 DIAGNOSIS — Z853 Personal history of malignant neoplasm of breast: Secondary | ICD-10-CM | POA: Diagnosis not present

## 2019-01-07 DIAGNOSIS — Z7901 Long term (current) use of anticoagulants: Secondary | ICD-10-CM | POA: Diagnosis not present

## 2019-01-07 DIAGNOSIS — Z0000 Encounter for general adult medical examination without abnormal findings: Secondary | ICD-10-CM

## 2019-01-07 DIAGNOSIS — E1142 Type 2 diabetes mellitus with diabetic polyneuropathy: Secondary | ICD-10-CM

## 2019-01-07 NOTE — Assessment & Plan Note (Addendum)
Physical today 01/07/19.  Mammogram 12/05/18 - Briads I.  Colonoscopy 02/03/09 - diverticulosis.  Cologuard.

## 2019-01-07 NOTE — Progress Notes (Signed)
[name], female   DOB: [birth year], 78 y.o.   MRN: [id number]   Subjective:    [name], female    DOB: [birth year], 78 y.o.   MRN: [id number]  HPI  Patient with past history of hypertension, hypercholesterolemia and diabetes.  She comes in today to follow up on these issues as well as for a complete physical exam.  She report continued back pain.  Seeing ortho.  Has been referred to Dr Sharlet Salina.  States otherwise doing well.  She is able to get around her house and do ADLs and things she needs to do.  No chest pain.  No sob.  No acid reflux.  No abdominal pain.  Bowels moving.  No cough or congestion.  Sugars doing well.     Past Medical History:  Diagnosis Date  . Anemia   . Arthritis   . Atrial fibrillation ([place])   . Breast cancer ([place])    s/p lumpectomy 1992.  s/p chemo and xrt left breast  . Diabetes mellitus ([place])   . Edema    feet/legs  . Gastric ulcer   . GERD (gastroesophageal reflux disease)   . HOH (hard of hearing)    aides  . Hypercholesterolemia   . Hypertension   . Pulmonary emboli [facility])    Past Surgical History:  Procedure Laterality Date  . BREAST LUMPECTOMY  03/06/1990   left breast  . CATARACT EXTRACTION W/PHACO Right 12/26/2016   Procedure: CATARACT EXTRACTION PHACO AND INTRAOCULAR LENS PLACEMENT (IOC)-RIGHT DIABETIC;  Surgeon: Birder Robson, MD;  Location: [facility] ORS;  Service: Ophthalmology;  Laterality: Right;  [place] 00:47 AP% 24.5 CDE 11.62 Fluid pack lot # [id number]  . CATARACT EXTRACTION W/PHACO Left 01/23/2017   Procedure: CATARACT EXTRACTION PHACO AND INTRAOCULAR LENS PLACEMENT (IOC);  Surgeon: Birder Robson, MD;  Location: [facility] ORS;  Service: Ophthalmology;  Laterality: Left;  [place] 00:50 AP% 16.1 CDE 8.18 Fluid pack lot #[id number] H   Family History  Problem Relation Age of Onset  . Heart disease Father   . Heart disease Brother        s/p CABG  . Colon cancer Neg Hx   . Breast cancer Neg Hx     Social History   Socioeconomic History  . Marital status: Widowed    Spouse name: Not on file  . Number of children: Not on file  . Years of education: Not on file  . Highest education level: Not on file  Occupational History  . Not on file  Social Needs  . Financial resource strain: Not hard at all  . Food insecurity    Worry: Never true    Inability: Never true  . Transportation needs    Medical: No    Non-medical: No  Tobacco Use  . Smoking status: Never Smoker  . Smokeless tobacco: Never Used  Substance and Sexual Activity  . Alcohol use: No    Alcohol/week: 0.0 standard drinks  . Drug use: No  . Sexual activity: Never  Lifestyle  . Physical activity    Days per week: 7 days    Minutes per session: 20 min  . Stress: Not at all  Relationships  . Social Herbalist on phone: Not on file    Gets together: Not on file    Attends religious service: Not on file    Active member of club or organization: Not on file    Attends meetings of clubs or  organizations: Not on file    Relationship status: Not on file  Other Topics Concern  . Not on file  Social History Narrative  . Not on file    Outpatient Encounter Medications as of 01/07/2019  Medication Sig  . amLODipine (NORVASC) 2.5 MG tablet Take 1 tablet (2.5 mg total) by mouth daily.  . CONTOUR TEST test strip USE TO TEST BLOOD SUGAR ONCE EVERY WEEK; IF DOSING HAS CHANGED PLEASE SUBMIT A NEW RX  . diphenhydrAMINE (BENADRYL) 25 mg capsule Take 25 mg at bedtime as needed by mouth for sleep.   . ferrous sulfate (FEROSUL) 325 (65 FE) MG tablet Take 1 tablet (325 mg total) by mouth daily.  Marland Kitchen losartan (COZAAR) 100 MG tablet Take 1 tablet (100 mg total) by mouth daily.  . Multiple Vitamin (MULTIVITAMIN WITH MINERALS) TABS tablet Take 1 tablet daily by mouth.  . pantoprazole (PROTONIX) 40 MG tablet TAKE 1 TABLET(40 MG) BY MOUTH DAILY  . simvastatin (ZOCOR) 10 MG tablet TAKE 1 TABLET BY MOUTH EVERY DAY  . warfarin  (COUMADIN) 2.5 MG tablet Takes 2 tablets (5 mg) on Tuesdays and Thursdays and Saturday, and 1 tablet (2.5 mg)  all other days of the week   No facility-administered encounter medications on file as of 01/07/2019.    Review of Systems  Constitutional: Negative for appetite change and unexpected weight change.  HENT: Negative for congestion and sinus pressure.   Eyes: Negative for pain and visual disturbance.  Respiratory: Negative for cough, chest tightness and shortness of breath.   Cardiovascular: Negative for chest pain, palpitations and leg swelling.  Gastrointestinal: Negative for abdominal pain, diarrhea, nausea and vomiting.  Genitourinary: Negative for difficulty urinating and dysuria.  Musculoskeletal: Positive for back pain. Negative for joint swelling and myalgias.  Skin: Negative for color change and rash.  Neurological: Negative for dizziness, light-headedness and headaches.  Hematological: Negative for adenopathy. Does not bruise/bleed easily.  Psychiatric/Behavioral: Negative for agitation and dysphoric mood.       Objective:    Physical Exam Constitutional:      General: She is not in acute distress.    Appearance: Normal appearance. She is well-developed.  HENT:     Head: Normocephalic and atraumatic.     Right Ear: External ear normal.     Left Ear: External ear normal.  Eyes:     General: No scleral icterus.       Right eye: No discharge.        Left eye: No discharge.     Conjunctiva/sclera: Conjunctivae normal.  Neck:     Musculoskeletal: Neck supple. No muscular tenderness.     Thyroid: No thyromegaly.  Cardiovascular:     Rate and Rhythm: Normal rate and regular rhythm.  Pulmonary:     Effort: No tachypnea, accessory muscle usage or respiratory distress.     Breath sounds: Normal breath sounds. No decreased breath sounds or wheezing.  Chest:     Breasts:        Right: No inverted nipple, mass, nipple discharge or tenderness (no axillary adenopathy).         Left: No inverted nipple, mass, nipple discharge or tenderness (no axilarry adenopathy).  Abdominal:     General: Bowel sounds are normal.     Palpations: Abdomen is soft.     Tenderness: There is no abdominal tenderness.  Musculoskeletal:        General: No swelling or tenderness.  Lymphadenopathy:     Cervical: No cervical adenopathy.  Skin:    General: Skin is warm.     Findings: No erythema or rash.  Neurological:     Mental Status: She is alert and oriented to person, place, and time.  Psychiatric:        Mood and Affect: Mood normal.        Behavior: Behavior normal.     BP 128/78   Pulse 96   Temp 97.7 F (36.5 C)   Resp 16   Ht '5\' 8"'  (1.727 m)   Wt 248 lb (112.5 kg)   SpO2 97%   BMI 37.71 kg/m  Wt Readings from Last 3 Encounters:  01/07/19 248 lb (112.5 kg)  12/27/18 256 lb (116.1 kg)  12/11/18 249 lb (112.9 kg)     Lab Results  Component Value Date   WBC 6.5 10/07/2018   HGB 13.1 10/07/2018   HCT 39.7 10/07/2018   PLT 228.0 10/07/2018   GLUCOSE 106 (H) 12/11/2018   CHOL 156 12/11/2018   TRIG 100.0 12/11/2018   HDL 55.90 12/11/2018   LDLCALC 80 12/11/2018   ALT 13 10/07/2018   AST 16 10/07/2018   NA 139 12/11/2018   K 4.2 12/11/2018   CL 102 12/11/2018   CREATININE 0.83 12/11/2018   BUN 16 12/11/2018   CO2 28 12/11/2018   TSH 4.09 11/14/2017   INR 1.7 (H) 01/07/2019   HGBA1C 5.4 10/07/2018   MICROALBUR <0.7 11/14/2017    Mm 3d Screen Breast Bilateral  Result Date: 12/05/2018 CLINICAL DATA:  Screening. EXAM: DIGITAL SCREENING BILATERAL MAMMOGRAM WITH TOMO AND CAD COMPARISON:  Previous exam(s). ACR Breast Density Category c: The breast tissue is heterogeneously dense, which may obscure small masses. FINDINGS: There are no findings suspicious for malignancy. Images were processed with CAD. IMPRESSION: No mammographic evidence of malignancy. A result letter of this screening mammogram will be mailed directly to the patient. RECOMMENDATION:  Screening mammogram in one year. (Code:SM-B-01Y) BI-RADS CATEGORY  1: Negative. Electronically Signed   By: Lovey Newcomer M.D.   On: 12/05/2018 16:34       Assessment & Plan:   Problem List Items Addressed This Visit    Compression fracture of thoracic vertebra (HCC)    Persistent back pain.  Remote L3 compression fracture and T11 compression fracture (subacute).  Has seen ortho.  Referred to Dr Sharlet Salina.        Diabetic polyneuropathy associated with type 2 diabetes mellitus (HCC)    Low carb diet and exercise.  Follow met b and a1c.   Lab Results  Component Value Date   HGBA1C 5.4 10/07/2018        Essential hypertension    Blood pressure under good control.  Continue same medication regimen.  Follow pressures.  Follow metabolic panel.        Health care maintenance    Physical today 01/07/19.  Mammogram 12/05/18 - Briads I.  Colonoscopy 02/03/09 - diverticulosis.  Cologuard.        History of breast cancer    Mammogram 12/05/18 - Birads I.       Hypercholesteremia    On simvastatin.  Low cholesterol diet and exercise.  Follow lipid panel and liver function tests.        Type 2 diabetes mellitus without complications (HCC)    Low carb diet and exercise.  Follow met b and a1c.         Other Visit Diagnoses    Anticoagulated on Coumadin    -  Primary  Relevant Orders   Protime-INR (Completed)   Colon cancer screening       Relevant Orders   Cologuard       Anita Pheasant, MD

## 2019-01-08 LAB — PROTIME-INR
INR: 1.7 — ABNORMAL HIGH (ref 0.9–1.2)
Prothrombin Time: 17.9 s — ABNORMAL HIGH (ref 9.1–12.0)

## 2019-01-12 ENCOUNTER — Encounter: Payer: Self-pay | Admitting: Internal Medicine

## 2019-01-12 NOTE — Assessment & Plan Note (Signed)
Low carb diet and exercise.  Follow met b and a1c.   Lab Results  Component Value Date   HGBA1C 5.4 10/07/2018

## 2019-01-12 NOTE — Assessment & Plan Note (Signed)
On simvastatin.  Low cholesterol diet and exercise.  Follow lipid panel and liver function tests.   

## 2019-01-12 NOTE — Assessment & Plan Note (Signed)
Low carb diet and exercise.  Follow met b and a1c.   

## 2019-01-12 NOTE — Assessment & Plan Note (Signed)
Persistent back pain.  Remote L3 compression fracture and T11 compression fracture (subacute).  Has seen ortho.  Referred to Dr Sharlet Salina.

## 2019-01-12 NOTE — Assessment & Plan Note (Signed)
Mammogram 12/05/18 - Birads I.

## 2019-01-12 NOTE — Assessment & Plan Note (Signed)
Blood pressure under good control.  Continue same medication regimen.  Follow pressures.  Follow metabolic panel.   

## 2019-01-14 DIAGNOSIS — Z1211 Encounter for screening for malignant neoplasm of colon: Secondary | ICD-10-CM | POA: Diagnosis not present

## 2019-01-15 ENCOUNTER — Other Ambulatory Visit (INDEPENDENT_AMBULATORY_CARE_PROVIDER_SITE_OTHER): Payer: Medicare Other

## 2019-01-15 ENCOUNTER — Other Ambulatory Visit: Payer: Self-pay

## 2019-01-15 DIAGNOSIS — Z7901 Long term (current) use of anticoagulants: Secondary | ICD-10-CM | POA: Diagnosis not present

## 2019-01-15 LAB — PROTIME-INR
INR: 1.9 ratio — ABNORMAL HIGH (ref 0.8–1.0)
Prothrombin Time: 21.6 s — ABNORMAL HIGH (ref 9.6–13.1)

## 2019-01-16 ENCOUNTER — Telehealth: Payer: Self-pay

## 2019-01-16 DIAGNOSIS — Z7901 Long term (current) use of anticoagulants: Secondary | ICD-10-CM

## 2019-01-16 NOTE — Telephone Encounter (Signed)
Future lab orders placed

## 2019-01-23 ENCOUNTER — Ambulatory Visit (INDEPENDENT_AMBULATORY_CARE_PROVIDER_SITE_OTHER): Payer: Medicare Other | Admitting: Physician Assistant

## 2019-01-23 ENCOUNTER — Other Ambulatory Visit: Payer: Self-pay

## 2019-01-23 DIAGNOSIS — N3946 Mixed incontinence: Secondary | ICD-10-CM

## 2019-01-23 LAB — COLOGUARD: Cologuard: POSITIVE — AB

## 2019-01-23 NOTE — Progress Notes (Signed)
PTNS  Session # monthly maintenance  Health & Social Factors: no change Caffeine: 2 Alcohol: 0 Daytime voids #per day: 10-12 Night-time voids #per night: 2 Urgency: mild Incontinence Episodes #per day: 1-3 Ankle used: right Treatment Setting: 15 Feeling/ Response: sensory Comments: patient had some discomfort with needle stick today, significant edema  Preformed By: Fonnie Jarvis, CMA   Follow Up: 1 month

## 2019-01-27 ENCOUNTER — Ambulatory Visit (INDEPENDENT_AMBULATORY_CARE_PROVIDER_SITE_OTHER): Payer: Medicare Other | Admitting: Podiatry

## 2019-01-27 ENCOUNTER — Telehealth: Payer: Self-pay

## 2019-01-27 ENCOUNTER — Encounter: Payer: Self-pay | Admitting: Podiatry

## 2019-01-27 ENCOUNTER — Other Ambulatory Visit: Payer: Self-pay

## 2019-01-27 DIAGNOSIS — D689 Coagulation defect, unspecified: Secondary | ICD-10-CM

## 2019-01-27 DIAGNOSIS — M79675 Pain in left toe(s): Secondary | ICD-10-CM | POA: Diagnosis not present

## 2019-01-27 DIAGNOSIS — R195 Other fecal abnormalities: Secondary | ICD-10-CM

## 2019-01-27 DIAGNOSIS — M79674 Pain in right toe(s): Secondary | ICD-10-CM | POA: Diagnosis not present

## 2019-01-27 DIAGNOSIS — B351 Tinea unguium: Secondary | ICD-10-CM | POA: Diagnosis not present

## 2019-01-27 DIAGNOSIS — E1142 Type 2 diabetes mellitus with diabetic polyneuropathy: Secondary | ICD-10-CM

## 2019-01-27 NOTE — Telephone Encounter (Signed)
Received

## 2019-01-27 NOTE — Progress Notes (Signed)
Complaint:  Visit Type: Patient returns to my office for continued preventative foot care services. Complaint: Patient states" my nails have grown long and thick and become painful to walk and wear shoes" Patient has been diagnosed with DM with neuropathy. The patient presents for preventative foot care services. No changes to ROS.  Patient is taking coumadin.  Podiatric Exam: Vascular: dorsalis pedis  are palpable bilateral. Posterior tibial pulses are non palpable due to ankle/leg swelling. Capillary return is immediate. Temperature gradient is WNL. Skin turgor WNL  Sensorium: Diminished  Semmes Weinstein monofilament test. Normal tactile sensation bilaterally. Nail Exam: Pt has thick disfigured discolored nails with subungual debris noted bilateral entire nail hallux through fifth toenails.  Pincer hallux nails. Ulcer Exam: There is no evidence of ulcer or pre-ulcerative changes or infection. Orthopedic Exam: Muscle tone and strength are WNL. No limitations in general ROM. No crepitus or effusions noted. Foot type and digits show no abnormalities. Bony prominences are unremarkable. Skin: No Porokeratosis. No infection or ulcers  Diagnosis:  Onychomycosis, , Pain in right toe, pain in left toes  Treatment & Plan Procedures and Treatment: Consent by patient was obtained for treatment procedures. The patient understood the discussion of treatment and procedures well. All questions were answered thoroughly reviewed. Debridement of mycotic and hypertrophic toenails, 1 through 5 bilateral and clearing of subungual debris. No ulceration, no infection noted.  Return Visit-Office Procedure: Patient instructed to return to the office for a follow up visit 10 weeks  for continued evaluation and treatment.    Gardiner Barefoot DPM

## 2019-01-27 NOTE — Telephone Encounter (Signed)
Copied from [place] [phone] [id number]. Topic: General - Inquiry >> Jan 27, 2019 10:03 AM Alanda Slim E wrote: Reason for CRM: Anita Walker from [employer] called to advised she sent over an abnormal cologuard result on 11.20.20 and wanted to confirm that it was received / please call if there are any questions or if it was not received

## 2019-01-28 NOTE — Telephone Encounter (Signed)
Patient aware of positive cologuard results and is agreeable to referral to GI

## 2019-01-28 NOTE — Telephone Encounter (Signed)
Order placed for GI referral.   

## 2019-01-29 ENCOUNTER — Other Ambulatory Visit: Payer: Self-pay

## 2019-01-29 ENCOUNTER — Ambulatory Visit (INDEPENDENT_AMBULATORY_CARE_PROVIDER_SITE_OTHER): Payer: Medicare Other | Admitting: Internal Medicine

## 2019-01-29 DIAGNOSIS — Z7901 Long term (current) use of anticoagulants: Secondary | ICD-10-CM

## 2019-01-29 LAB — PROTIME-INR
INR: 2 ratio — ABNORMAL HIGH (ref 0.8–1.0)
Prothrombin Time: 23 s — ABNORMAL HIGH (ref 9.6–13.1)

## 2019-01-30 ENCOUNTER — Encounter: Payer: Self-pay | Admitting: Internal Medicine

## 2019-01-30 NOTE — Progress Notes (Signed)
Came in for lab only.  No office visit.

## 2019-02-03 DIAGNOSIS — M62830 Muscle spasm of back: Secondary | ICD-10-CM | POA: Diagnosis not present

## 2019-02-03 DIAGNOSIS — M47816 Spondylosis without myelopathy or radiculopathy, lumbar region: Secondary | ICD-10-CM | POA: Diagnosis not present

## 2019-02-03 DIAGNOSIS — M5136 Other intervertebral disc degeneration, lumbar region: Secondary | ICD-10-CM | POA: Diagnosis not present

## 2019-02-04 ENCOUNTER — Other Ambulatory Visit: Payer: Self-pay

## 2019-02-04 MED ORDER — FERROUS SULFATE 325 (65 FE) MG PO TABS: 325.0000 mg | ORAL_TABLET | Freq: Every day | ORAL | 2 refills | Status: DC

## 2019-02-04 MED ORDER — SIMVASTATIN 10 MG PO TABS: ORAL_TABLET | ORAL | 1 refills | Status: DC

## 2019-02-05 ENCOUNTER — Telehealth: Payer: Self-pay

## 2019-02-05 DIAGNOSIS — Z7901 Long term (current) use of anticoagulants: Secondary | ICD-10-CM

## 2019-02-05 NOTE — Telephone Encounter (Signed)
-----   Message from Einar Pheasant, MD sent at 01/30/2019 10:38 AM EST ----- Notify pt that her INR is ok.  Continue current coumadin dose and recheck pt/inr in 3 weeks.

## 2019-02-05 NOTE — Telephone Encounter (Signed)
Lab ordered for lab appt in 3 weeks per Dr. Bary Leriche lab result note.

## 2019-02-13 ENCOUNTER — Other Ambulatory Visit: Payer: Self-pay | Admitting: Internal Medicine

## 2019-02-14 ENCOUNTER — Other Ambulatory Visit: Payer: Self-pay

## 2019-02-14 ENCOUNTER — Ambulatory Visit (INDEPENDENT_AMBULATORY_CARE_PROVIDER_SITE_OTHER): Payer: Medicare Other | Admitting: Physician Assistant

## 2019-02-14 DIAGNOSIS — N3946 Mixed incontinence: Secondary | ICD-10-CM

## 2019-02-14 NOTE — Progress Notes (Signed)
PTNS  Session # monthly maintenance Health & Social Factors: no change Caffeine: 2 Alcohol: 0 Daytime voids #per day: unknown Night-time voids #per night: 2 Urgency: none Incontinence Episodes #per day: 2 Ankle used: right Treatment Setting: 12 Feeling/ Response: both Comments: BLE edema, discomfort prior to initiation of therapy.  Performed By: Debroah Loop, PA-C   Follow Up: Return in about 4 weeks (around 03/14/2019) for PTNS maintenance.

## 2019-02-17 ENCOUNTER — Encounter: Payer: Self-pay | Admitting: Pain Medicine

## 2019-02-18 ENCOUNTER — Encounter: Payer: Self-pay | Admitting: *Deleted

## 2019-02-18 ENCOUNTER — Other Ambulatory Visit (INDEPENDENT_AMBULATORY_CARE_PROVIDER_SITE_OTHER): Payer: Medicare Other

## 2019-02-18 ENCOUNTER — Other Ambulatory Visit: Payer: Self-pay

## 2019-02-18 ENCOUNTER — Ambulatory Visit: Payer: Medicare Other | Attending: Pain Medicine | Admitting: Pain Medicine

## 2019-02-18 DIAGNOSIS — S22080S Wedge compression fracture of T11-T12 vertebra, sequela: Secondary | ICD-10-CM | POA: Diagnosis not present

## 2019-02-18 DIAGNOSIS — M48061 Spinal stenosis, lumbar region without neurogenic claudication: Secondary | ICD-10-CM | POA: Insufficient documentation

## 2019-02-18 DIAGNOSIS — S32030S Wedge compression fracture of third lumbar vertebra, sequela: Secondary | ICD-10-CM | POA: Insufficient documentation

## 2019-02-18 DIAGNOSIS — Z7901 Long term (current) use of anticoagulants: Secondary | ICD-10-CM

## 2019-02-18 DIAGNOSIS — G8929 Other chronic pain: Secondary | ICD-10-CM | POA: Insufficient documentation

## 2019-02-18 DIAGNOSIS — M545 Low back pain, unspecified: Secondary | ICD-10-CM | POA: Insufficient documentation

## 2019-02-18 DIAGNOSIS — M5030 Other cervical disc degeneration, unspecified cervical region: Secondary | ICD-10-CM

## 2019-02-18 DIAGNOSIS — M47816 Spondylosis without myelopathy or radiculopathy, lumbar region: Secondary | ICD-10-CM | POA: Insufficient documentation

## 2019-02-18 DIAGNOSIS — S32010S Wedge compression fracture of first lumbar vertebra, sequela: Secondary | ICD-10-CM

## 2019-02-18 DIAGNOSIS — M2578 Osteophyte, vertebrae: Secondary | ICD-10-CM

## 2019-02-18 DIAGNOSIS — G894 Chronic pain syndrome: Secondary | ICD-10-CM | POA: Insufficient documentation

## 2019-02-18 NOTE — Progress Notes (Signed)
Disclaimer: In compliance with Federal Rules mandating open notes, implemented by the [place], these notes are made available to patients through the electronic medical record. Information contained herein reflects the providers review of information obtained during the pre-charting review of records, as well as notes taken during the patients appointment.  Although all data contained herein was reviewed by the provider, unless specifically stated, due to time constrains, not all of it was discussed with the patient during the appointment. Specific medical language and abbreviations used within medical records is meant to communicate precise data to individuals trained to interpret such data.  Warning: These encounter notes are not meant to serve the purpose of providing patients with clear and concise information on their conditions, treatment plan, or specific risks and possible complications. Such information is provided to patients under the encounter's "Patient Information" section. Interpretation of the information contained herein should be left to individuals with the necessary training to understand the data.  Patient's Name: Anita Walker  MRN: [id number]  Referring Provider: Einar Pheasant, MD  DOB: 1940-06-12  PCP: Einar Pheasant, MD  DOS: 02/18/2019  Note by: Gaspar Cola, MD  Service setting: Ambulatory outpatient  Specialty: Interventional Pain Management  Location: [facility] Pain Management Virtual Visit  Visit type: Initial Patient Evaluation  Patient type: New patient ("FAST-TRACK" Evaluation)   "Fast-Track" Clarificastion: This referral option does not include the extensive pharmacological evaluation required for [place] to take over the patient's medication management. The "Fast-Track" system is designed to bypass the new patient referral waiting list, as well as the normal patient evaluation process, in order to provide a patient in distress with a timely pain  management intervention. Because the system was not designed to unfairly get a patient into our pain practice ahead of those already waiting, certain restrictions apply. By requesting a "Fast-Track" consult, the referring physician has opted to continue managing the patient's medications in order to get interventional urgent care.  Pain Management Virtual Encounter Note - Virtual Visit via Telephone Telehealth (real-time audio visits between healthcare provider and patient).   Patient's Phone No.:  [phone] (home); [phone] (mobile); (Preferred) [phone] [email or web address]  [id number] [address], [place] AT [facility] [address] [place] [address] Phone: [phone] Fax: [phone]    Pre-screening note:  Our staff contacted Anita Walker and offered her an "in person", "face-to-face" appointment versus a telephone encounter. She indicated preferring the telephone encounter, at this time.  Primary Reason(s) for Visit: Tele-Encounter for initial evaluation of one or more chronic problems (new to examiner) potentially causing chronic pain, and posing a threat to normal musculoskeletal function. (Level of risk: High) CC: Right-sided low back pain  I contacted Anita Walker on 02/18/2019 via telephone.      I clearly identified myself as Gaspar Cola, MD. I verified that I was speaking with the correct person using two identifiers (Name: Anita Walker, and date of birth: December 01, 1940).  Advanced Informed Consent I sought verbal advanced consent from Anita Walker for virtual visit interactions. I informed Anita Walker of possible security and privacy concerns, risks, and limitations associated with providing "not-in-person" medical evaluation and management services. I also informed Anita Walker of the availability of "in-person" appointments. Finally, I informed her that there would be a charge for the virtual visit  and that she could be  personally, fully or partially, financially responsible for it. Anita Walker expressed understanding and  agreed to proceed.   HPI  Anita Walker is a 78 y.o. year old, female patient, contacted today for an initial evaluation of her chronic pain. She has Hypertension; Hypercholesteremia; History of breast cancer; Long term current use of anticoagulant therapy; Mixed emotional features as adjustment reaction; Health care maintenance; Essential hypertension; Pure hypercholesterolemia; Type 2 diabetes mellitus without complications ([place]); Cough; Right hip pain; Syncope; Urinary frequency; Diabetic polyneuropathy associated with type 2 diabetes mellitus ([place]); Abnormal chest CT; Constipation; Fall; Pain due to onychomycosis of toenails of both feet; Coagulation disorder ([place]); Compression fracture of thoracic vertebra ([place]); 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   Onset and Duration: Gradual and Present longer than 3 months Cause of pain: Trauma Severity: Getting better, NAS-11 at its worse: 9/10, NAS-11 at its best: 2/10, NAS-11 now: 2/10 and NAS-11 on the average: 2/10 Timing: Not influenced by the time of the day, During activity or exercise and After activity or exercise Aggravating Factors: Prolonged standing Alleviating Factors: Medications and Resting Associated Problems: Swelling Quality of Pain: Aching Previous Examinations or Tests: CT scan, X-rays and Orthopedic evaluation Previous Treatments: Physical Therapy  According to the patient,  she has had multiple falls the last of which appears to have been around August 07, 2018.  She started experiencing low back pain primarily on the right side and x-rays revealed that she apparently has a lumbar vertebral body fracture.  An MRI also reveals that the L3 compression fracture seems to be old and it does have some retropulsion.  The same MRI also reveals that the patient has bilateral lumbar facet arthropathy.  She describes her pain as being primarily in the right side of the lower back and worsening when she stands up for period time.  She denies any lower extremity pain, numbness, or weakness.  The pain comes and goes and she was referred to [place] by Whitney L. Meeler, FNP-C for the purpose of having a bilateral L4-5 and L5-S1 lumbar facet blocks.  Of significance is the fact that the patient has a history of Coumadin anticoagulation secondary to blood clots that she had approximately 9 years ago.  She refers having stoped the Coumadin in the past for the purpose of having surgeries or procedures, without any apparent complications.  The patient was informed that my practice is divided into two sections: an interventional pain management section, as well as a completely separate and distinct medication management section. I explained that I have procedure days for my interventional therapies, and evaluation days for follow-ups and medication management. Because of the amount of documentation required during both, they are kept separated. This means that there is the possibility that she may be scheduled for a procedure on one day, and medication management the next. I have also informed her that because of staffing and facility limitations, I no longer take patients for medication management only. To illustrate the reasons for this, I gave the patient the example of surgeons, and how inappropriate it would be to refer a patient to his/her care, just to write for the post-surgical antibiotics on a  surgery done by a different surgeon.   Because interventional pain management is my board-certified specialty,  the patient was informed that joining my practice means that they are open to any and all interventional therapies. I made it clear that this does not mean that they will be forced to have any procedures done. What this means is that I believe interventional therapies to be essential part of the diagnosis and proper management of chronic pain conditions. Therefore, patients not interested in these interventional alternatives will be better served under the care of a different practitioner.  The patient was also made aware of my Comprehensive Pain Management Safety Guidelines where by joining my practice, they limit all of their nerve blocks and joint injections to those done by our practice, for as long as we are retained to manage their care.   Historic Controlled Substance Pharmacotherapy Review  PMP and historical list of controlled substances: Hydrocodone/APAP 5/325 1 tab p.o. every 6 hours Highest opioid analgesic regimen found: Hydrocodone/APAP 5/325 1 tab p.o. 5 times per day Most recent opioid analgesic: Hydrocodone/APAP 5/325 1 tab p.o. every 6 hours  Current opioid analgesics:  None Highest recorded MME/day: 25 mg/day MME/day: 0 mg/day  Medications: Bottles not available for inspection. Pharmacodynamics: Desired effects: Analgesia: The patient reports >50% benefit. Reported improvement in function: The patient reports medication allows her to accomplish basic ADLs. Clinically meaningful improvement in function (CMIF): Sustained CMIF goals met Perceived effectiveness: Described as relatively effective, allowing for increase in activities of daily living (ADL) Undesirable effects: Side-effects or Adverse reactions: None reported Historical Monitoring: The patient  reports no history of drug use. List of all UDS Test(s): No results found. List of other Serum/Urine Drug  Screening Test(s):  No results found. Historical Background Evaluation: [place] PMP: PDMP not reviewed this encounter. Six (6) year initial data search conducted.             PMP NARX Score Report:  Narcotic: 090 Sedative: 040 Stimulant: 000 [place] Department of public safety, offender search: Editor, commissioning Information) Non-contributory Risk Assessment Profile: Aberrant behavior: None observed or detected today Risk factors for fatal opioid overdose: None identified today PMP NARX Overdose Risk Score: 230 Fatal overdose hazard ratio (HR): Calculation deferred Non-fatal overdose hazard ratio (HR): Calculation deferred Risk of opioid abuse or dependence: 0.7-3.0% with doses ? 36 MME/day and 6.1-26% with doses ? 120 MME/day. Substance use disorder (SUD) risk level: See below Personal History of Substance Abuse (SUD-Substance use disorder):  Alcohol:    Illegal Drugs:    Rx Drugs:    ORT Risk Level calculation:    ORT Scoring interpretation table:  Score <3 = Low Risk for SUD  Score between 4-7 = Moderate Risk for SUD  Score >8 = High Risk for Opioid Abuse   Pharmacologic Plan: As per protocol, I have not taken over any controlled substance management, pending the results of ordered tests and/or consults.            Initial impression: Pending review of available data and ordered tests.  Meds   Current Outpatient Medications:  .  amLODipine (NORVASC) 2.5 MG tablet, Take 1 tablet (2.5 mg total) by mouth daily., Disp: 30 tablet, Rfl: 2 .  CONTOUR TEST test strip, USE TO TEST BLOOD SUGAR ONCE EVERY WEEK, Disp: 25 strip, Rfl: 1 .  diphenhydrAMINE (BENADRYL) 25 mg capsule, Take 25 mg at bedtime as needed by mouth for sleep. , Disp: , Rfl:  .  ferrous sulfate (FEROSUL) 325 (65 FE) MG tablet, Take 1 tablet (325 mg total) by mouth daily., Disp: 90 tablet, Rfl: 2 .  losartan (COZAAR) 100 MG tablet, Take 1 tablet (100 mg total) by mouth daily., Disp: 90 tablet, Rfl: 3 .  Multiple Vitamin (MULTIVITAMIN WITH  MINERALS) TABS tablet, Take 1 tablet daily by mouth., Disp: , Rfl:  .  pantoprazole (PROTONIX) 40 MG tablet, TAKE 1 TABLET(40 MG) BY MOUTH DAILY, Disp: 90 tablet, Rfl: 1 .  simvastatin (ZOCOR) 10 MG tablet, TAKE 1 TABLET BY MOUTH EVERY DAY, Disp: 90 tablet, Rfl: 1 .  warfarin (COUMADIN) 2.5 MG tablet, Takes 2 tablets (5 mg) on Tuesdays and Thursdays and Saturday, and 1 tablet (2.5 mg)  all other days of the week, Disp: 142 tablet, Rfl: 2  ROS  Cardiovascular: High blood pressure and Blood thinners:  Anticoagulant Pulmonary or Respiratory: Lung problems Neurological: No reported neurological signs or symptoms such as seizures, abnormal skin sensations, urinary and/or fecal incontinence, being born with an abnormal open spine and/or a tethered spinal cord Review of Past Neurological Studies:  Results for orders placed or performed during the hospital encounter of 08/13/18  CT Head Wo Contrast   Narrative   CLINICAL DATA:  Status post fall today.  Initial encounter.  EXAM: CT HEAD WITHOUT CONTRAST  CT CERVICAL SPINE WITHOUT CONTRAST  TECHNIQUE: Multidetector CT imaging of the head and cervical spine was performed following the standard protocol without intravenous contrast. Multiplanar CT image reconstructions of the cervical spine were also generated.  COMPARISON:  Head and cervical spine CT scans 08/08/2018.  FINDINGS: CT HEAD FINDINGS  Brain: No evidence of acute infarction, hemorrhage, hydrocephalus, extra-axial collection or mass lesion/mass effect. Chronic microvascular ischemic change noted.  Vascular: No hyperdense vessel or unexpected calcification.  Skull: Intact.  No focal lesion.  Sinuses/Orbits: Status post cataract surgery. Minimal mucosal thickening right sphenoid sinus noted.  Other: None.  CT CERVICAL SPINE FINDINGS  Alignment: Maintained with straightening of lordosis noted.  Skull base and vertebrae: No acute fracture. No primary bone lesion or focal  pathologic process.  Soft tissues and spinal canal: No prevertebral fluid or swelling. No visible canal hematoma.  Disc levels: Mild loss of disc space height is most notable at C5-6.  Upper chest: Lung apices clear.  Other: None.  IMPRESSION: No acute abnormality head or cervical spine.  Chronic microvascular ischemic change.  Mild cervical degenerative disease.   Electronically Signed   By: Inge Rise M.D.   On: 08/13/2018 12:30    Psychological-Psychiatric: No reported psychological or psychiatric signs or symptoms such as difficulty sleeping, anxiety, depression, delusions or hallucinations (schizophrenial), mood swings (bipolar disorders) or suicidal ideations or attempts Gastrointestinal: Reflux or heatburn Genitourinary: No reported renal or genitourinary signs or symptoms such as difficulty voiding or producing urine, peeing blood, non-functioning kidney, kidney stones, difficulty emptying the bladder, difficulty controlling the flow of urine, or chronic kidney disease Hematological: No reported hematological signs or symptoms such as prolonged bleeding, low or poor functioning platelets, bruising or bleeding easily, hereditary bleeding problems, low energy levels due to low hemoglobin or being anemic Endocrine: High blood sugar controlled without the use of insulin (NIDDM) Rheumatologic: No reported rheumatological signs and symptoms such as fatigue, joint pain, tenderness, swelling, redness, heat, stiffness, decreased range of motion, with or without associated rash Musculoskeletal: Negative for myasthenia gravis, muscular dystrophy, multiple sclerosis or malignant hyperthermia Work History: Disabled  Allergies  Anita Walker is allergic to penicillins and penicillin v potassium.  Laboratory Chemistry Profile   Screening No results found.  Inflammation (CRP: Acute Phase) (ESR: Chronic Phase) Lab Results  Component Value  Date   CRP <0.8 10/31/2016    ESRSEDRATE 5 10/31/2016   LATICACIDVEN 1.1 10/30/2016                         Rheumatology No results found.  Renal Lab Results  Component Value Date   BUN 16 12/11/2018   CREATININE 0.83 12/11/2018   GFR 66.02 12/11/2018   GFRAA >60 08/07/2018   GFRNONAA >60 08/07/2018                             Hepatic Lab Results  Component Value Date   AST 16 10/07/2018   ALT 13 10/07/2018   ALBUMIN 4.2 10/07/2018   ALKPHOS 66 10/07/2018                        Electrolytes Lab Results  Component Value Date   NA 139 12/11/2018   K 4.2 12/11/2018   CL 102 12/11/2018   CALCIUM 10.2 12/11/2018                        Neuropathy Lab Results  Component Value Date   HGBA1C 5.4 10/07/2018                        CNS No results found.  Bone No results found.  Coagulation Lab Results  Component Value Date   INR 2.0 (H) 01/29/2019   LABPROT 23.0 (H) 01/29/2019   APTT 43 (H) 11/02/2016   PLT 228.0 10/07/2018                        Cardiovascular Lab Results  Component Value Date   TROPONINI <0.03 11/02/2016   HGB 13.1 10/07/2018   HCT 39.7 10/07/2018                         ID Lab Results  Component Value Date   MICROTEXT  04/25/2013       SOURCE: nasal swab    COMMENT                   NEGATIVE FOR INFLUENZA A (ANTIGEN ABSENT)   COMMENT                   NEGATIVE FOR INFLUENZA B (ANTIGEN ABSENT)   ANTIBIOTIC                                                       MICROTEXT  04/25/2013       SOURCE: CLEAN CATCH    COMMENT                   MIXED BACTERIAL ORGANISMS   COMMENT                   RESULTS SUGGESTIVE OF CONTAMINATION   ANTIBIOTIC  Cancer No results found.  Endocrine Lab Results  Component Value Date   TSH 4.09 11/14/2017                        Note: Lab results reviewed.  Imaging Review  Cervical Imaging: Cervical CT wo contrast:  Results for orders placed during the hospital  encounter of 08/13/18  CT Cervical Spine Wo Contrast   Narrative CLINICAL DATA:  Status post fall today.  Initial encounter.  EXAM: CT HEAD WITHOUT CONTRAST  CT CERVICAL SPINE WITHOUT CONTRAST  TECHNIQUE: Multidetector CT imaging of the head and cervical spine was performed following the standard protocol without intravenous contrast. Multiplanar CT image reconstructions of the cervical spine were also generated.  COMPARISON:  Head and cervical spine CT scans 08/08/2018.  FINDINGS: CT HEAD FINDINGS  Brain: No evidence of acute infarction, hemorrhage, hydrocephalus, extra-axial collection or mass lesion/mass effect. Chronic microvascular ischemic change noted.  Vascular: No hyperdense vessel or unexpected calcification.  Skull: Intact.  No focal lesion.  Sinuses/Orbits: Status post cataract surgery. Minimal mucosal thickening right sphenoid sinus noted.  Other: None.  CT CERVICAL SPINE FINDINGS  Alignment: Maintained with straightening of lordosis noted.  Skull base and vertebrae: No acute fracture. No primary bone lesion or focal pathologic process.  Soft tissues and spinal canal: No prevertebral fluid or swelling. No visible canal hematoma.  Disc levels: Mild loss of disc space height is most notable at C5-6.  Upper chest: Lung apices clear.  Other: None.  IMPRESSION: No acute abnormality head or cervical spine.  Chronic microvascular ischemic change.  Mild cervical degenerative disease.   Electronically Signed   By: Inge Rise M.D.   On: 08/13/2018 12:30    Shoulder Imaging: Shoulder-R DG:  Results for orders placed during the hospital encounter of 08/13/18  DG Shoulder Right   Narrative CLINICAL DATA:  Pt reports was coming in for a visit and when she was walking through [place] doors [place] medical mall she thought she was up far enough and the door hit her walker causing her to fall.  EXAM: RIGHT SHOULDER - 2+ VIEW  COMPARISON:   Chest x-ray on 10/24/2016  FINDINGS: There is no evidence of fracture or dislocation. There is no evidence of arthropathy or other focal bone abnormality. Soft tissues are unremarkable.  IMPRESSION: Negative.   Electronically Signed   By: Nolon Nations M.D.   On: 08/13/2018 12:50    Shoulder-L DG:  Results for orders placed during the hospital encounter of 08/07/18  DG Shoulder Left   Narrative CLINICAL DATA:  Pain status post fall  EXAM: LEFT SHOULDER - 2+ VIEW  COMPARISON:  None.  FINDINGS: There is no evidence of fracture or dislocation. There is no evidence of arthropathy or other focal bone abnormality. Soft tissues are unremarkable. There is probable calcific tendinosis of the supraspinatus. Multiple surgical clips are noted in the left axilla.  IMPRESSION: No acute osseous abnormality.   Electronically Signed   By: Constance Holster M.D.   On: 08/08/2018 00:50    Lumbosacral Imaging: Lumbar MR wo contrast:  Results for orders placed during the hospital encounter of 10/05/18  MR LUMBAR SPINE WO CONTRAST   Narrative CLINICAL DATA:  Chronic low back pain.  Fell on 08/08/2018.  EXAM: MRI LUMBAR SPINE WITHOUT CONTRAST  TECHNIQUE: Multiplanar, multisequence MR imaging of the lumbar spine was performed. No intravenous contrast was administered.  COMPARISON:  Lumbar radiographs from 2013.  FINDINGS: Segmentation:  There are five lumbar type vertebral bodies. The last full intervertebral disc space is labeled L5-S1.  Alignment:  Normal  Vertebrae: Remote L3 anterior wedge like compression fracture with mild/minimal retropulsion.  There is a subtle superior endplate compression fracture of T11 which is likely subacute. No significant compression deformity or retropulsion.  Conus medullaris and cauda equina: Conus extends to the L1-2 level. Conus and cauda equina appear normal.  Paraspinal and other soft tissues: No significant paraspinal  or retroperitoneal findings. Small right renal cyst noted.  Disc levels:  T12-L1: No significant findings.  L1-2: No significant findings.  Mild facet disease.  L2-3: Bulging annulus and mild retropulsion of the superior posterior aspect of the L3 vertebral body. There is flattening of the ventral thecal sac and mild bilateral lateral recess stenosis. No foraminal stenosis. Early spinal stenosis.  L3-4: Mild retropulsion of the posteroinferior aspect of L3 along with a bulging annulus and moderate facet disease contributing to mild bilateral lateral recess stenosis. No significant spinal or foraminal stenosis.  L4-5: Moderate facet disease and mild bulging annulus but no significant spinal or foraminal stenosis.  L5-S1: Small focal central disc protrusion contacting the ventral thecal sac. No foraminal stenosis. There is a far lateral disc osteophyte complex on the right which could potentially irritate the extraforaminal right L5 nerve root. Moderate to advanced facet disease, left greater than right.  IMPRESSION: 1. Remote L3 compression fracture with mild retropulsion. 2. Probable subacute superior endplate fracture of Y86. 3. Mild bilateral lateral recess stenosis at L2-3 and L3-4 no significant foraminal stenosis. Early spinal stenosis. 4. Small focal central disc protrusion at L5-S1. 5. Far right lateral disc osteophyte complex at L5-S1 with potential irritation of the extraforaminal right L5 nerve root.   Electronically Signed   By: Marijo Sanes M.D.   On: 10/05/2018 17:15    Lumbar DG 2-3 views:  Results for orders placed during the hospital encounter of 10/08/18  DG Lumbar Spine 2-3 Views   Narrative CLINICAL DATA:  Fall with low back pain  EXAM: LUMBAR SPINE - 2-3 VIEW  COMPARISON:  None.  FINDINGS: There is a chronic compression fracture of L3 with approximately 50% height loss. The other vertebral body heights are maintained. There is multilevel  degenerative disc disease, greatest at the lower thoracic and upper lumbar spine. Alignment is normal.  IMPRESSION: Unchanged chronic compression fracture of L3. No acute abnormality of the lumbar spine.   Electronically Signed   By: Ulyses Jarred M.D.   On: 10/08/2018 03:06    Lumbar DG (Complete) 4+V:  Results for orders placed during the hospital encounter of 01/02/12  DG Lumbar Spine Complete   Narrative *RADIOLOGY REPORT*  Clinical Data: Back and hip pain  LUMBAR SPINE - COMPLETE 4+ VIEW  Comparison: None.  Findings: The lumbar vertebrae are in normal alignment.  There is somewhat diminished disc space at L5-S1.  Anterior osteophyte formation is noted at T12-L1 and L1-L2 levels.  No compression deformity is seen.  The SI joints appear corticated.  IMPRESSION: Normal level with diffuse degenerative change.  No acute compression deformity.   Original Report Authenticated By: Joretta Bachelor, M.D.          Hip Imaging: Hip-R DG 2-3 views:  Results for orders placed during the hospital encounter of 08/07/18  DG Hip Unilat W or Wo Pelvis 2-3 Views Right   Narrative CLINICAL DATA:  Right hip pain  EXAM: DG HIP (WITH OR WITHOUT PELVIS) 2-3V RIGHT  COMPARISON:  None.  FINDINGS: There is no evidence of hip fracture or dislocation. There is no evidence of arthropathy or other focal bone abnormality.  IMPRESSION: Negative.   Electronically Signed   By: Constance Holster M.D.   On: 08/08/2018 00:49    Knee Imaging: Knee-L DG 1-2 views:  Results for orders placed in visit on 09/27/15  DG Knee 1-2 Views Left   Narrative CLINICAL DATA:  Left knee and leg pain and swelling ; no mention of injury EXAM: LEFT KNEE - 1-2 VIEW COMPARISON:  None in PACs FINDINGS: The bones are subjectively adequately mineralized. There is no acute or healing fracture. There is mild narrowing of the medial joint compartment. There is beaking of the medial tibial spine.  The lateral and patellofemoral compartments are preserved in width. There is no joint effusion. IMPRESSION: Moderate degenerative change centered on the medial joint compartment. No acute fracture or dislocation. Electronically Signed   By: David  Walker M.D.   On: 09/27/2015 14:33   Foot Imaging: Foot-R DG Complete:  Results for orders placed in visit on 01/15/13  DG Foot Complete Right   Narrative 3 views of the right foot demonstrates an osseously mature foot. Plantar  calcaneal spurring as well as calcification intratendinous [place] of the  Achilles. She also has hardening of the dorsalis pedis artery mild  hammertoe deformities bilateral   Foot-L DG Complete:  Results for orders placed in visit on 01/15/13  DG Foot Complete Left   Narrative 3 views of the left foot demonstrates an osseously mature foot with distal  osteopenia early osteoarthritic changes to the first metatarsophalangeal  joint and fibular sesamoid she also has some early changes to the midfoot  left. An ossific in areas present other than soft tissue fat and edema no  other osseous abnormalities.   Complexity Note: Imaging results reviewed. Results shared with Anita Walker, using Layman's terms.                        [place]  Drug: Anita Walker  reports no history of drug use. Alcohol:  reports no history of alcohol use. Tobacco:  reports that she has never smoked. She has never used smokeless tobacco. Medical:  has a past medical history of Anemia, Arthritis, Atrial fibrillation ([place]), Breast cancer ([place]), Diabetes mellitus ([place]), Edema, Gastric ulcer, GERD (gastroesophageal reflux disease), HOH (hard of hearing), Hypercholesterolemia, Hypertension, and Pulmonary emboli ([place]). Family: family history includes Heart disease in her brother and father.  Past Surgical History:  Procedure Laterality Date  . BREAST LUMPECTOMY  03/06/1990   left breast  . CATARACT EXTRACTION W/PHACO Right 12/26/2016   Procedure: CATARACT  EXTRACTION PHACO AND INTRAOCULAR LENS PLACEMENT (IOC)-RIGHT DIABETIC;  Surgeon: Birder Robson, MD;  Location: [facility] ORS;  Service: Ophthalmology;  Laterality: Right;  [place] 00:47 AP% 24.5 CDE 11.62 Fluid pack lot # [id number] H  . CATARACT EXTRACTION W/PHACO Left 01/23/2017   Procedure: CATARACT EXTRACTION PHACO AND INTRAOCULAR LENS PLACEMENT (IOC);  Surgeon: Birder Robson, MD;  Location: [facility] ORS;  Service: Ophthalmology;  Laterality: Left;  [place] 00:50 AP% 16.1 CDE 8.18 Fluid pack lot #[id number]   Active Ambulatory Problems    Diagnosis Date Noted  . Hypertension 01/01/2012  . Hypercholesteremia 01/01/2012  . History of breast cancer 01/01/2012  . Long term current use of anticoagulant therapy 04/11/2013  . Mixed emotional features as adjustment reaction 07/20/2013  . Health care maintenance 05/23/2014  . Essential hypertension 01/01/2012  . Pure hypercholesterolemia 01/01/2012  .  Type 2 diabetes mellitus without complications ([place]) 31/49/7026  . Cough 10/25/2016  . Right hip pain 10/30/2016  . Syncope 11/10/2016  . Urinary frequency 01/21/2017  . Diabetic polyneuropathy associated with type 2 diabetes mellitus ([place]) 03/18/2017  . Abnormal chest CT 03/18/2017  . Constipation 08/18/2018  . Fall 08/18/2018  . Pain due to onychomycosis of toenails of both feet 09/02/2018  . Coagulation disorder ([place]) 09/02/2018  . Compression fracture of thoracic vertebra ([place]) 10/12/2018  . 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 Compression fracture of thoracolumbar vertebra (T11), sequela 02/18/2019  . Lumbar facet arthropathy (Multilevel) 02/18/2019  . 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 L5-S1 disc-osteophyte complex (Right) 02/18/2019  . Lumbar facet syndrome (Bilateral) (R>L) 02/18/2019  . Chronic anticoagulation (Coumadin) 02/18/2019  . Chronic low back pain (Primary Area of Pain) (Bilateral) (R>L) w/o  sciatica 02/18/2019  . Chronic pain syndrome 02/18/2019   Resolved Ambulatory Problems    Diagnosis Date Noted  . Diabetes mellitus ([place]) 01/01/2012  . Cough 04/10/2012  . Ecchymosis 04/11/2013  . UTI (urinary tract infection) 07/20/2013  . Thrush, oral 09/17/2014  . Rash 11/09/2014  . Viral upper respiratory illness 04/09/2015  . Acute bronchitis 04/16/2015  . Finger pain, right 05/20/2015  . Pain of finger of right hand 05/23/2015  . Long term current use of anticoagulant 04/11/2013  . Dry lips 07/06/2015  . Cough 09/03/2015  . Left leg pain 09/27/2015  . Respiratory infection 09/12/2016  . Malignant neoplasm of breast (female) ([place]) 01/01/2012   Past Medical History:  Diagnosis Date  . Anemia   . Arthritis   . Atrial fibrillation ([place])   . Breast cancer ([place])   . Edema   . Gastric ulcer   . GERD (gastroesophageal reflux disease)   . HOH (hard of hearing)   . Hypercholesterolemia   . Pulmonary emboli [facility])    Assessment  Primary Diagnosis & Pertinent Problem List: The primary encounter diagnosis was Chronic low back pain (Primary Area of Pain) (Bilateral) (R>L) w/o sciatica. 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  Visit Diagnosis (New problems to examiner): 1. Chronic low back pain (Primary Area of Pain) (Bilateral) (R>L) w/o sciatica   2. Lumbar facet syndrome (Bilateral) (R>L)   3. Lumbar facet arthropathy (Multilevel)   4. 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 Compression fracture of thoracolumbar vertebra (T11), sequela   6. Lumbar  lateral recess stenosis (L2-3, 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)   7. L5-S1 disc-osteophyte complex (Right)   8. Chronic pain syndrome   9. Chronic anticoagulation (Coumadin)    Plan of Care (Initial workup plan)  Note: Anita Walker was reminded that as per protocol, today's visit has been an evaluation only. We have not taken over the patient's controlled substance management.  Problem-specific plan: No problem-specific Assessment & Plan notes found for this encounter.  Lab Orders  No laboratory test(s) ordered today   Imaging Orders  No imaging studies ordered today   Referral Orders  No referral(s) requested today    Procedure Orders     L-FCT Blk (Schedule) Pharmacotherapy (current): Medications ordered:  No orders of the defined types  were placed in this encounter.  Medications administered during this visit: Anita Walker had no medications administered during this visit.   Pharmacological management options:  Opioid Analgesics: The patient was informed that there is no guarantee that she would be a candidate for opioid analgesics. The decision will be made following CDC guidelines. This decision will be based on the results of diagnostic studies, as well as Ms. Bradt risk profile.   Membrane stabilizer: To be determined at a later time  Muscle relaxant: To be determined at a later time  NSAID: To be determined at a later time  Other analgesic(s): To be determined at a later time   Interventional management options: Anita Walker was informed that there is no guarantee that she would be a candidate for interventional therapies. The decision will be based on the results of diagnostic studies, as well as Ms. Ghan risk profile.  Procedure(s) under consideration:  Diagnostic bilateral vs right-sided L4-5 and L5-S1 lumbar facet block #1 under fluoroscopic guidance and IV sedation   Provider-requested follow-up: Return for Procedure (w/ sedation): (B) L4-5 & L5-S1 L-FCT BLK #1  (Fast-Track), (Blood-thinner Protocol).  Future Appointments  Date Time Provider [place]  02/18/2019  4:00 PM [facility]  03/17/2019  2:30 PM Vaillancourt, [place], PA-C [facility] None  04/07/2019 11:00 AM Gardiner Barefoot, DPM [facility]  05/20/2019  2:30 PM Einar Pheasant, MD [facility]    Total duration of non-face-to-face encounter: 25 minutes.  Primary Care Physician: Einar Pheasant, MD Location: [facility] Note by: Gaspar Cola, MD Date: 02/18/2019; Time: 1:47 PM  Note: This dictation was prepared with Dragon dictation. Any transcriptional errors that may result from this process are unintentional.

## 2019-02-18 NOTE — Addendum Note (Signed)
Addended by: Leeanne Rio on: 02/18/2019 03:26 PM   Modules accepted: Orders

## 2019-02-18 NOTE — Progress Notes (Deleted)
Disclaimer: In compliance with Federal Rules mandating open notes, implemented by the [place], these notes are made available to patients through the electronic medical record. Information contained herein reflects the providers review of information obtained during the pre-charting review of records, as well as notes taken during the patients appointment.  Although all data contained herein was reviewed by the provider, unless specifically stated, due to time constrains, not all of it was discussed with the patient during the appointment. Specific medical language and abbreviations used within medical records is meant to communicate precise data to individuals trained to interpret such data.  Warning: These encounter notes are not meant to serve the purpose of providing patients with clear and concise information on their conditions, treatment plan, or specific risks and possible complications. Such information is provided to patients under the encounter's "Patient Information" section. Interpretation of the information contained herein should be left to individuals with the necessary training to understand the data.  Patient's Name: Anita Walker  MRN: [id number]  Referring Provider: Einar Pheasant, MD  DOB: 1940/04/03  PCP: Einar Pheasant, MD  DOS: 02/18/2019  Note by: Gaspar Cola, MD  Service setting: Ambulatory outpatient  Specialty: Interventional Pain Management  Location: [facility] (AMB) Pain Management Facility    Patient type: New patient ("FAST-TRACK" Evaluation)   "Fast-Track" Clarificastion: This referral option does not include the extensive pharmacological evaluation required for [place] to take over the patient's medication management. The "Fast-Track" system is designed to bypass the new patient referral waiting list, as well as the normal patient evaluation process, in order to provide a patient in distress with a timely pain management intervention. Because the  system was not designed to unfairly get a patient into our pain practice ahead of those already waiting, certain restrictions apply. By requesting a "Fast-Track" consult, the referring physician has opted to continue managing the patient's medications in order to get interventional urgent care.  Primary Reason for Visit: Interventional Pain Management Treatment. CC: No chief complaint on file.   Procedure  HPI  Ms. Walker is a 78 y.o. year old, female patient, who comes today for a  "Fast-Track" new patient evaluation, as requested by Einar Pheasant, MD. The patient has been made aware that this type of referral option is reserved for the Interventional Pain Management portion of our practice and completely excludes the option of medication management. Her primarily concern today is the No chief complaint on file.  Pain Assessment: Location:     Radiating:   Onset:   Duration:   Quality:   Severity:  /10 (subjective, self-reported pain score)  Note: Reported level is compatible with observation.                         When using our objective Pain Scale, levels between 6 and 10/10 are said to belong in an emergency room, as it progressively worsens from a 6/10, described as severely limiting, requiring emergency care not usually available at an outpatient pain management facility. At a 6/10 level, communication becomes difficult and requires great effort. Assistance to reach the emergency department may be required. Facial flushing and profuse sweating along with potentially dangerous increases in heart rate and blood pressure will be evident. Effect on ADL:   Timing:   Modifying factors:   BP:    HR:    Onset and Duration: Gradual and Present longer than 3 months Cause of pain: Trauma Severity: Getting better, NAS-11  at its worse: 9/10, NAS-11 at its best: 2/10, NAS-11 now: 2/10 and NAS-11 on the average: 2/10 Timing: Not influenced by the time of the day, During activity or exercise  and After activity or exercise Aggravating Factors: Prolonged standing Alleviating Factors: Medications and Resting Associated Problems: Swelling Quality of Pain: Aching Previous Examinations or Tests: CT scan, X-rays and Orthopedic evaluation Previous Treatments: Physical Therapy  The patient comes into the clinics today, referred to [place] for a ***  Meds   Current Outpatient Medications:  .  amLODipine (NORVASC) 2.5 MG tablet, Take 1 tablet (2.5 mg total) by mouth daily., Disp: 30 tablet, Rfl: 2 .  CONTOUR TEST test strip, USE TO TEST BLOOD SUGAR ONCE EVERY WEEK, Disp: 25 strip, Rfl: 1 .  diphenhydrAMINE (BENADRYL) 25 mg capsule, Take 25 mg at bedtime as needed by mouth for sleep. , Disp: , Rfl:  .  ferrous sulfate (FEROSUL) 325 (65 FE) MG tablet, Take 1 tablet (325 mg total) by mouth daily., Disp: 90 tablet, Rfl: 2 .  losartan (COZAAR) 100 MG tablet, Take 1 tablet (100 mg total) by mouth daily., Disp: 90 tablet, Rfl: 3 .  Multiple Vitamin (MULTIVITAMIN WITH MINERALS) TABS tablet, Take 1 tablet daily by mouth., Disp: , Rfl:  .  pantoprazole (PROTONIX) 40 MG tablet, TAKE 1 TABLET(40 MG) BY MOUTH DAILY, Disp: 90 tablet, Rfl: 1 .  simvastatin (ZOCOR) 10 MG tablet, TAKE 1 TABLET BY MOUTH EVERY DAY, Disp: 90 tablet, Rfl: 1 .  warfarin (COUMADIN) 2.5 MG tablet, Takes 2 tablets (5 mg) on Tuesdays and Thursdays and Saturday, and 1 tablet (2.5 mg)  all other days of the week, Disp: 142 tablet, Rfl: 2  Imaging Review  Cervical Imaging: Cervical CT wo contrast:  Results for orders placed during the hospital encounter of 08/13/18  CT Cervical Spine Wo Contrast   Narrative CLINICAL DATA:  Status post fall today.  Initial encounter.  EXAM: CT HEAD WITHOUT CONTRAST  CT CERVICAL SPINE WITHOUT CONTRAST  TECHNIQUE: Multidetector CT imaging of the head and cervical spine was performed following the standard protocol without intravenous contrast. Multiplanar CT image reconstructions of the cervical  spine were also generated.  COMPARISON:  Head and cervical spine CT scans 08/08/2018.  FINDINGS: CT HEAD FINDINGS  Brain: No evidence of acute infarction, hemorrhage, hydrocephalus, extra-axial collection or mass lesion/mass effect. Chronic microvascular ischemic change noted.  Vascular: No hyperdense vessel or unexpected calcification.  Skull: Intact.  No focal lesion.  Sinuses/Orbits: Status post cataract surgery. Minimal mucosal thickening right sphenoid sinus noted.  Other: None.  CT CERVICAL SPINE FINDINGS  Alignment: Maintained with straightening of lordosis noted.  Skull base and vertebrae: No acute fracture. No primary bone lesion or focal pathologic process.  Soft tissues and spinal canal: No prevertebral fluid or swelling. No visible canal hematoma.  Disc levels: Mild loss of disc space height is most notable at C5-6.  Upper chest: Lung apices clear.  Other: None.  IMPRESSION: No acute abnormality head or cervical spine.  Chronic microvascular ischemic change.  Mild cervical degenerative disease.   Electronically Signed   By: Inge Rise M.D.   On: 08/13/2018 12:30    Shoulder Imaging: Shoulder-R DG:  Results for orders placed during the hospital encounter of 08/13/18  DG Shoulder Right   Narrative CLINICAL DATA:  Pt reports was coming in for a visit and when she was walking [place] medical [place] she thought she was up far enough and the door  hit her walker causing her to fall.  EXAM: RIGHT SHOULDER - 2+ VIEW  COMPARISON:  Chest x-ray on 10/24/2016  FINDINGS: There is no evidence of fracture or dislocation. There is no evidence of arthropathy or other focal bone abnormality. Soft tissues are unremarkable.  IMPRESSION: Negative.   Electronically Signed   By: Nolon Nations M.D.   On: 08/13/2018 12:50    Shoulder-L DG:  Results for orders placed during the hospital encounter of 08/07/18  DG Shoulder  Left   Narrative CLINICAL DATA:  Pain status post fall  EXAM: LEFT SHOULDER - 2+ VIEW  COMPARISON:  None.  FINDINGS: There is no evidence of fracture or dislocation. There is no evidence of arthropathy or other focal bone abnormality. Soft tissues are unremarkable. There is probable calcific tendinosis of the supraspinatus. Multiple surgical clips are noted in the left axilla.  IMPRESSION: No acute osseous abnormality.   Electronically Signed   By: Constance Holster M.D.   On: 08/08/2018 00:50    Lumbosacral Imaging: Lumbar MR wo contrast:  Results for orders placed during the hospital encounter of 10/05/18  MR LUMBAR SPINE WO CONTRAST   Narrative CLINICAL DATA:  Chronic low back pain.  Fell on 08/08/2018.  EXAM: MRI LUMBAR SPINE WITHOUT CONTRAST  TECHNIQUE: Multiplanar, multisequence MR imaging of the lumbar spine was performed. No intravenous contrast was administered.  COMPARISON:  Lumbar radiographs from 2013.  FINDINGS: Segmentation: There are five lumbar type vertebral bodies. The last full intervertebral disc space is labeled L5-S1.  Alignment:  Normal  Vertebrae: Remote L3 anterior wedge like compression fracture with mild/minimal retropulsion.  There is a subtle superior endplate compression fracture of T11 which is likely subacute. No significant compression deformity or retropulsion.  Conus medullaris and cauda equina: Conus extends to the L1-2 level. Conus and cauda equina appear normal.  Paraspinal and other soft tissues: No significant paraspinal or retroperitoneal findings. Small right renal cyst noted.  Disc levels:  T12-L1: No significant findings.  L1-2: No significant findings.  Mild facet disease.  L2-3: Bulging annulus and mild retropulsion of the superior posterior aspect of the L3 vertebral body. There is flattening of the ventral thecal sac and mild bilateral lateral recess stenosis. No foraminal stenosis. Early spinal  stenosis.  L3-4: Mild retropulsion of the posteroinferior aspect of L3 along with a bulging annulus and moderate facet disease contributing to mild bilateral lateral recess stenosis. No significant spinal or foraminal stenosis.  L4-5: Moderate facet disease and mild bulging annulus but no significant spinal or foraminal stenosis.  L5-S1: Small focal central disc protrusion contacting the ventral thecal sac. No foraminal stenosis. There is a far lateral disc osteophyte complex on the right which could potentially irritate the extraforaminal right L5 nerve root. Moderate to advanced facet disease, left greater than right.  IMPRESSION: 1. Remote L3 compression fracture with mild retropulsion. 2. Probable subacute superior endplate fracture of A07. 3. Mild bilateral lateral recess stenosis at L2-3 and L3-4 no significant foraminal stenosis. Early spinal stenosis. 4. Small focal central disc protrusion at L5-S1. 5. Far right lateral disc osteophyte complex at L5-S1 with potential irritation of the extraforaminal right L5 nerve root.   Electronically Signed   By: Marijo Sanes M.D.   On: 10/05/2018 17:15    Lumbar DG 2-3 views:  Results for orders placed during the hospital encounter of 10/08/18  DG Lumbar Spine 2-3 Views   Narrative CLINICAL DATA:  Fall with low back pain  EXAM: LUMBAR SPINE - 2-3  VIEW  COMPARISON:  None.  FINDINGS: There is a chronic compression fracture of L3 with approximately 50% height loss. The other vertebral body heights are maintained. There is multilevel degenerative disc disease, greatest at the lower thoracic and upper lumbar spine. Alignment is normal.  IMPRESSION: Unchanged chronic compression fracture of L3. No acute abnormality of the lumbar spine.   Electronically Signed   By: Ulyses Jarred M.D.   On: 10/08/2018 03:06    Lumbar DG (Complete) 4+V:  Results for orders placed during the hospital encounter of 01/02/12  DG Lumbar  Spine Complete   Narrative *RADIOLOGY REPORT*  Clinical Data: Back and hip pain  LUMBAR SPINE - COMPLETE 4+ VIEW  Comparison: None.  Findings: The lumbar vertebrae are in normal alignment.  There is somewhat diminished disc space at L5-S1.  Anterior osteophyte formation is noted at T12-L1 and L1-L2 levels.  No compression deformity is seen.  The SI joints appear corticated.  IMPRESSION: Normal level with diffuse degenerative change.  No acute compression deformity.   Original Report Authenticated By: Joretta Bachelor, M.D.          Hip Imaging: Hip-R DG 2-3 views:  Results for orders placed during the hospital encounter of 08/07/18  DG Hip Unilat W or Wo Pelvis 2-3 Views Right   Narrative CLINICAL DATA:  Right hip pain  EXAM: DG HIP (WITH OR WITHOUT PELVIS) 2-3V RIGHT  COMPARISON:  None.  FINDINGS: There is no evidence of hip fracture or dislocation. There is no evidence of arthropathy or other focal bone abnormality.  IMPRESSION: Negative.   Electronically Signed   By: Constance Holster M.D.   On: 08/08/2018 00:49    Knee Imaging: Knee-L DG 1-2 views:  Results for orders placed in visit on 09/27/15  DG Knee 1-2 Views Left   Narrative CLINICAL DATA:  Left knee and leg pain and swelling ; no mention of injury EXAM: LEFT KNEE - 1-2 VIEW COMPARISON:  None in PACs FINDINGS: The bones are subjectively adequately mineralized. There is no acute or healing fracture. There is mild narrowing of the medial joint compartment. There is beaking of the medial tibial spine. The lateral and patellofemoral compartments are preserved in width. There is no joint effusion. IMPRESSION: Moderate degenerative change centered on the medial joint compartment. No acute fracture or dislocation. Electronically Signed   By: David  Walker M.D.   On: 09/27/2015 14:33   Foot Imaging: Foot-R DG Complete:  Results for orders placed in visit on 01/15/13  DG Foot Complete Right    Narrative 3 views of the right foot demonstrates an osseously mature foot. Plantar  calcaneal spurring as well as calcification intratendinous [place] of the  Achilles. She also has hardening of the dorsalis pedis artery mild  hammertoe deformities bilateral   Foot-L DG Complete:  Results for orders placed in visit on 01/15/13  DG Foot Complete Left   Narrative 3 views of the left foot demonstrates an osseously mature foot with distal  osteopenia early osteoarthritic changes to the first metatarsophalangeal  joint and fibular sesamoid she also has some early changes to the midfoot  left. An ossific in areas present other than soft tissue fat and edema no  other osseous abnormalities.   Complexity Note: Imaging results reviewed. Results shared with Ms. Walker, using Layman's terms.                        ROS  Cardiovascular: High blood pressure and  Blood thinners:  Anticoagulant Pulmonary or Respiratory: Lung problems Neurological: No reported neurological signs or symptoms such as seizures, abnormal skin sensations, urinary and/or fecal incontinence, being born with an abnormal open spine and/or a tethered spinal cord Review of Past Neurological Studies:  Results for orders placed or performed during the hospital encounter of 08/13/18  CT Head Wo Contrast   Narrative   CLINICAL DATA:  Status post fall today.  Initial encounter.  EXAM: CT HEAD WITHOUT CONTRAST  CT CERVICAL SPINE WITHOUT CONTRAST  TECHNIQUE: Multidetector CT imaging of the head and cervical spine was performed following the standard protocol without intravenous contrast. Multiplanar CT image reconstructions of the cervical spine were also generated.  COMPARISON:  Head and cervical spine CT scans 08/08/2018.  FINDINGS: CT HEAD FINDINGS  Brain: No evidence of acute infarction, hemorrhage, hydrocephalus, extra-axial collection or mass lesion/mass effect. Chronic microvascular ischemic change noted.  Vascular: No  hyperdense vessel or unexpected calcification.  Skull: Intact.  No focal lesion.  Sinuses/Orbits: Status post cataract surgery. Minimal mucosal thickening right sphenoid sinus noted.  Other: None.  CT CERVICAL SPINE FINDINGS  Alignment: Maintained with straightening of lordosis noted.  Skull base and vertebrae: No acute fracture. No primary bone lesion or focal pathologic process.  Soft tissues and spinal canal: No prevertebral fluid or swelling. No visible canal hematoma.  Disc levels: Mild loss of disc space height is most notable at C5-6.  Upper chest: Lung apices clear.  Other: None.  IMPRESSION: No acute abnormality head or cervical spine.  Chronic microvascular ischemic change.  Mild cervical degenerative disease.   Electronically Signed   By: Inge Rise M.D.   On: 08/13/2018 12:30    Psychological-Psychiatric: No reported psychological or psychiatric signs or symptoms such as difficulty sleeping, anxiety, depression, delusions or hallucinations (schizophrenial), mood swings (bipolar disorders) or suicidal ideations or attempts Gastrointestinal: Reflux or heatburn Genitourinary: No reported renal or genitourinary signs or symptoms such as difficulty voiding or producing urine, peeing blood, non-functioning kidney, kidney stones, difficulty emptying the bladder, difficulty controlling the flow of urine, or chronic kidney disease Hematological: No reported hematological signs or symptoms such as prolonged bleeding, low or poor functioning platelets, bruising or bleeding easily, hereditary bleeding problems, low energy levels due to low hemoglobin or being anemic Endocrine: High blood sugar controlled without the use of insulin (NIDDM) Rheumatologic: No reported rheumatological signs and symptoms such as fatigue, joint pain, tenderness, swelling, redness, heat, stiffness, decreased range of motion, with or without associated rash Musculoskeletal: Negative for  myasthenia gravis, muscular dystrophy, multiple sclerosis or malignant hyperthermia Work History: Disabled  Allergies  Ms. Walker is allergic to penicillins and penicillin v potassium.  Laboratory Chemistry Profile   Screening No results found for: SARSCOV2NAA, COVIDSOURCE, STAPHAUREUS, MRSAPCR, HCVAB, HIV, PREGTESTUR  Inflammation (CRP: Acute Phase) (ESR: Chronic Phase) Lab Results  Component Value Date   CRP <0.8 10/31/2016   ESRSEDRATE 5 10/31/2016   LATICACIDVEN 1.1 10/30/2016                         Rheumatology No results found for: RF, ANA, LABURIC, URICUR, LYMEIGGIGMAB, LYMEABIGMQN, HLAB27                      Renal Lab Results  Component Value Date   BUN 16 12/11/2018   CREATININE 0.83 12/11/2018   GFR 66.02 12/11/2018   GFRAA >60 08/07/2018   GFRNONAA >60 08/07/2018  Hepatic Lab Results  Component Value Date   AST 16 10/07/2018   ALT 13 10/07/2018   ALBUMIN 4.2 10/07/2018   ALKPHOS 66 10/07/2018                        Electrolytes Lab Results  Component Value Date   NA 139 12/11/2018   K 4.2 12/11/2018   CL 102 12/11/2018   CALCIUM 10.2 12/11/2018                        Neuropathy Lab Results  Component Value Date   HGBA1C 5.4 10/07/2018                        CNS No results found for: COLORCSF, APPEARCSF, RBCCOUNTCSF, WBCCSF, POLYSCSF, LYMPHSCSF, EOSCSF, PROTEINCSF, GLUCCSF, JCVIRUS, CSFOLI, IGGCSF, LABACHR, ACETBL                      Bone No results found for: VD25OH, VD125OH2TOT, G2877219, ZP9150VW9, 25OHVITD1, 25OHVITD2, 25OHVITD3, TESTOFREE, TESTOSTERONE                       Coagulation Lab Results  Component Value Date   INR 2.0 (H) 01/29/2019   LABPROT 23.0 (H) 01/29/2019   APTT 43 (H) 11/02/2016   PLT 228.0 10/07/2018                        Cardiovascular Lab Results  Component Value Date   TROPONINI <0.03 11/02/2016   HGB 13.1 10/07/2018   HCT 39.7 10/07/2018                          ID Lab Results  Component Value Date   MICROTEXT  04/25/2013       SOURCE: nasal swab    COMMENT                   NEGATIVE FOR INFLUENZA A (ANTIGEN ABSENT)   COMMENT                   NEGATIVE FOR INFLUENZA B (ANTIGEN ABSENT)   ANTIBIOTIC                                                       MICROTEXT  04/25/2013       SOURCE: CLEAN CATCH    COMMENT                   MIXED BACTERIAL ORGANISMS   COMMENT                   RESULTS SUGGESTIVE OF CONTAMINATION   ANTIBIOTIC                                                        Cancer No results found for: CEA, CA125, LABCA2                      Endocrine Lab Results  Component Value Date  TSH 4.09 11/14/2017                        Note: Lab results reviewed.  [place]  Drug: Ms. Walker  reports no history of drug use. Alcohol:  reports no history of alcohol use. Tobacco:  reports that she has never smoked. She has never used smokeless tobacco. Medical:  has a past medical history of Anemia, Arthritis, Atrial fibrillation ([place]), Breast cancer ([place]), Diabetes mellitus ([place]), Edema, Gastric ulcer, GERD (gastroesophageal reflux disease), HOH (hard of hearing), Hypercholesterolemia, Hypertension, and Pulmonary emboli ([place]). Family: family history includes Heart disease in her brother and father.  Past Surgical History:  Procedure Laterality Date  . BREAST LUMPECTOMY  03/06/1990   left breast  . CATARACT EXTRACTION W/PHACO Right 12/26/2016   Procedure: CATARACT EXTRACTION PHACO AND INTRAOCULAR LENS PLACEMENT (IOC)-RIGHT DIABETIC;  Surgeon: Birder Robson, MD;  Location: [facility] ORS;  Service: Ophthalmology;  Laterality: Right;  [place] 00:47 AP% 24.5 CDE 11.62 Fluid pack lot # [id number]  . CATARACT EXTRACTION W/PHACO Left 01/23/2017   Procedure: CATARACT EXTRACTION PHACO AND INTRAOCULAR LENS PLACEMENT (IOC);  Surgeon: Birder Robson, MD;  Location: [facility] ORS;  Service: Ophthalmology;  Laterality: Left;  [place] 00:50 AP% 16.1 CDE  8.18 Fluid pack lot #[id number]   Active Ambulatory Problems    Diagnosis Date Noted  . Hypertension 01/01/2012  . Hypercholesteremia 01/01/2012  . History of breast cancer 01/01/2012  . Long term current use of anticoagulant therapy 04/11/2013  . Mixed emotional features as adjustment reaction 07/20/2013  . Health care maintenance 05/23/2014  . Essential hypertension 01/01/2012  . Pure hypercholesterolemia 01/01/2012  . Type 2 diabetes mellitus without complications ([place]) 32/20/2542  . Cough 10/25/2016  . Right hip pain 10/30/2016  . Syncope 11/10/2016  . Urinary frequency 01/21/2017  . Diabetic polyneuropathy associated with type 2 diabetes mellitus ([place]) 03/18/2017  . Abnormal chest CT 03/18/2017  . Constipation 08/18/2018  . Fall 08/18/2018  . Pain due to onychomycosis of toenails of both feet 09/02/2018  . Coagulation disorder ([place]) 09/02/2018  . Compression fracture of thoracic vertebra ([place]) 10/12/2018   Resolved Ambulatory Problems    Diagnosis Date Noted  . Diabetes mellitus ([place]) 01/01/2012  . Cough 04/10/2012  . Ecchymosis 04/11/2013  . UTI (urinary tract infection) 07/20/2013  . Thrush, oral 09/17/2014  . Rash 11/09/2014  . Viral upper respiratory illness 04/09/2015  . Acute bronchitis 04/16/2015  . Finger pain, right 05/20/2015  . Pain of finger of right hand 05/23/2015  . Long term current use of anticoagulant 04/11/2013  . Dry lips 07/06/2015  . Cough 09/03/2015  . Left leg pain 09/27/2015  . Respiratory infection 09/12/2016  . Malignant neoplasm of breast (female) ([place]) 01/01/2012   Past Medical History:  Diagnosis Date  . Anemia   . Arthritis   . Atrial fibrillation ([place])   . Breast cancer ([place])   . Edema   . Gastric ulcer   . GERD (gastroesophageal reflux disease)   . HOH (hard of hearing)   . Hypercholesterolemia   . Pulmonary emboli ([place])    Constitutional Exam  General appearance: Well nourished, well developed, and well hydrated. In no  apparent acute distress There were no vitals filed for this visit. BMI Assessment: Estimated body mass index is 37.71 kg/m as calculated from the following:   Height as of 01/07/19: '5\' 8"'  (1.727 m).   Weight as of 01/07/19: 248 lb (112.5 kg).  BMI interpretation table: BMI level Category  Range association with higher incidence of chronic pain  <18 kg/m2 Underweight   18.5-24.9 kg/m2 Ideal body weight   25-29.9 kg/m2 Overweight Increased incidence by 20%  30-34.9 kg/m2 Obese (Class I) Increased incidence by 68%  35-39.9 kg/m2 Severe obesity (Class II) Increased incidence by 136%  >40 kg/m2 Extreme obesity (Class III) Increased incidence by 254%   Patient's current BMI Ideal Body weight  There is no height or weight on file to calculate BMI. Patient weight not recorded   BMI Readings from Last 4 Encounters:  01/07/19 37.71 kg/m  12/27/18 38.92 kg/m  12/11/18 37.86 kg/m  10/10/18 37.74 kg/m   Wt Readings from Last 4 Encounters:  01/07/19 248 lb (112.5 kg)  12/27/18 256 lb (116.1 kg)  12/11/18 249 lb (112.9 kg)  10/10/18 248 lb 3.2 oz (112.6 kg)  Psych/Mental status: Alert, oriented x 3 (person, place, & time)       Eyes: PERLA Respiratory: No evidence of acute respiratory distress  Lumbar Spine Area Exam  Skin & Axial Inspection: No masses, redness, or swelling Alignment: Symmetrical Functional ROM: Unrestricted ROM       Stability: No instability detected Muscle Tone/Strength: Functionally intact. No obvious neuro-muscular anomalies detected. Sensory (Neurological): Unimpaired Palpation: No palpable anomalies       Provocative Tests: Hyperextension/rotation test: deferred today       Lumbar quadrant test (Kemp's test): deferred today       Lateral bending test: deferred today       Patrick's Maneuver: deferred today                   FABER* test: deferred today                   S-I anterior distraction/compression test: deferred today         S-I lateral compression  test: deferred today         S-I Thigh-thrust test: deferred today         S-I Gaenslen's test: deferred today         *(Flexion, ABduction and External Rotation)  Gait & Posture Assessment  Ambulation: Unassisted Gait: Relatively normal for age and body habitus Posture: WNL   Lower Extremity Exam    Side: Right lower extremity  Side: Left lower extremity  Stability: No instability observed          Stability: No instability observed          Skin & Extremity Inspection: Skin color, temperature, and hair growth are WNL. No peripheral edema or cyanosis. No masses, redness, swelling, asymmetry, or associated skin lesions. No contractures.  Skin & Extremity Inspection: Skin color, temperature, and hair growth are WNL. No peripheral edema or cyanosis. No masses, redness, swelling, asymmetry, or associated skin lesions. No contractures.  Functional ROM: Unrestricted ROM                  Functional ROM: Unrestricted ROM                  Muscle Tone/Strength: Functionally intact. No obvious neuro-muscular anomalies detected.  Muscle Tone/Strength: Functionally intact. No obvious neuro-muscular anomalies detected.  Sensory (Neurological): Unimpaired        Sensory (Neurological): Unimpaired        DTR: Patellar: deferred today Achilles: deferred today Plantar: deferred today  DTR: Patellar: deferred today Achilles: deferred today Plantar: deferred today  Palpation: No palpable anomalies  Palpation: No palpable anomalies    Procedure

## 2019-02-18 NOTE — Patient Instructions (Signed)
____________________________________________________________________________________________  Preparing for Procedure with Sedation  Procedure appointments are limited to planned procedures: . No Prescription Refills. . No disability issues will be discussed. . No medication changes will be discussed.  Instructions: . Oral Intake: Do not eat or drink anything for at least 8 hours prior to your procedure. . Transportation: Public transportation is not allowed. Bring an adult driver. The driver must be physically present in our waiting room before any procedure can be started. . Physical Assistance: Bring an adult physically capable of assisting you, in the event you need help. This adult should keep you company at home for at least 6 hours after the procedure. . Blood Pressure Medicine: Take your blood pressure medicine with a sip of water the morning of the procedure. . Blood thinners: Notify our staff if you are taking any blood thinners. Depending on which one you take, there will be specific instructions on how and when to stop it. . Diabetics on insulin: Notify the staff so that you can be scheduled 1st case in the morning. If your diabetes requires high dose insulin, take only  of your normal insulin dose the morning of the procedure and notify the staff that you have done so. . Preventing infections: Shower with an antibacterial soap the morning of your procedure. . Build-up your immune system: Take 1000 mg of Vitamin C with every meal (3 times a day) the day prior to your procedure. . Antibiotics: Inform the staff if you have a condition or reason that requires you to take antibiotics before dental procedures. . Pregnancy: If you are pregnant, call and cancel the procedure. . Sickness: If you have a cold, fever, or any active infections, call and cancel the procedure. . Arrival: You must be in the facility at least 30 minutes prior to your scheduled procedure. . Children: Do not bring  children with you. . Dress appropriately: Bring dark clothing that you would not mind if they get stained. . Valuables: Do not bring any jewelry or valuables.  Reasons to call and reschedule or cancel your procedure: (Following these recommendations will minimize the risk of a serious complication.) . Surgeries: Avoid having procedures within 2 weeks of any surgery. (Avoid for 2 weeks before or after any surgery). . Flu Shots: Avoid having procedures within 2 weeks of a flu shots or . (Avoid for 2 weeks before or after immunizations). . Barium: Avoid having a procedure within 7-10 days after having had a radiological study involving the use of radiological contrast. (Myelograms, Barium swallow or enema study). . Heart attacks: Avoid any elective procedures or surgeries for the initial 6 months after a "Myocardial Infarction" (Heart Attack). . Blood thinners: It is imperative that you stop these medications before procedures. Let us know if you if you take any blood thinner.  . Infection: Avoid procedures during or within two weeks of an infection (including chest colds or gastrointestinal problems). Symptoms associated with infections include: Localized redness, fever, chills, night sweats or profuse sweating, burning sensation when voiding, cough, congestion, stuffiness, runny nose, sore throat, diarrhea, nausea, vomiting, cold or Flu symptoms, recent or current infections. It is specially important if the infection is over the area that we intend to treat. . Heart and lung problems: Symptoms that may suggest an active cardiopulmonary problem include: cough, chest pain, breathing difficulties or shortness of breath, dizziness, ankle swelling, uncontrolled high or unusually low blood pressure, and/or palpitations. If you are experiencing any of these symptoms, cancel your procedure and contact   your primary care physician for an evaluation.  Remember:  Regular Business hours are:  Monday to Thursday  8:00 AM to 4:00 PM  Provider's Schedule: Ester Hilley, MD:  Procedure days: Tuesday and Thursday 7:30 AM to 4:00 PM  Bilal Lateef, MD:  Procedure days: Monday and Wednesday 7:30 AM to 4:00 PM ____________________________________________________________________________________________   ____________________________________________________________________________________________  General Risks and Possible Complications  Patient Responsibilities: It is important that you read this as it is part of your informed consent. It is our duty to inform you of the risks and possible complications associated with treatments offered to you. It is your responsibility as a patient to read this and to ask questions about anything that is not clear or that you believe was not covered in this document.  Patient's Rights: You have the right to refuse treatment. You also have the right to change your mind, even after initially having agreed to have the treatment done. However, under this last option, if you wait until the last second to change your mind, you may be charged for the materials used up to that point.  Introduction: Medicine is not an exact science. Everything in Medicine, including the lack of treatment(s), carries the potential for danger, harm, or loss (which is by definition: Risk). In Medicine, a complication is a secondary problem, condition, or disease that can aggravate an already existing one. All treatments carry the risk of possible complications. The fact that a side effects or complications occurs, does not imply that the treatment was conducted incorrectly. It must be clearly understood that these can happen even when everything is done following the highest safety standards.  No treatment: You can choose not to proceed with the proposed treatment alternative. The "PRO(s)" would include: avoiding the risk of complications associated with the therapy. The "CON(s)" would include: not  getting any of the treatment benefits. These benefits fall under one of three categories: diagnostic; therapeutic; and/or palliative. Diagnostic benefits include: getting information which can ultimately lead to improvement of the disease or symptom(s). Therapeutic benefits are those associated with the successful treatment of the disease. Finally, palliative benefits are those related to the decrease of the primary symptoms, without necessarily curing the condition (example: decreasing the pain from a flare-up of a chronic condition, such as incurable terminal cancer).  General Risks and Complications: These are associated to most interventional treatments. They can occur alone, or in combination. They fall under one of the following six (6) categories: no benefit or worsening of symptoms; bleeding; infection; nerve damage; allergic reactions; and/or death. 1. No benefits or worsening of symptoms: In Medicine there are no guarantees, only probabilities. No healthcare provider can ever guarantee that a medical treatment will work, they can only state the probability that it may. Furthermore, there is always the possibility that the condition may worsen, either directly, or indirectly, as a consequence of the treatment. 2. Bleeding: This is more common if the patient is taking a blood thinner, either prescription or over the counter (example: Goody Powders, Fish oil, Aspirin, Garlic, etc.), or if suffering a condition associated with impaired coagulation (example: Hemophilia, cirrhosis of the liver, low platelet counts, etc.). However, even if you do not have one on these, it can still happen. If you have any of these conditions, or take one of these drugs, make sure to notify your treating physician. 3. Infection: This is more common in patients with a compromised immune system, either due to disease (example: diabetes, cancer, human immunodeficiency virus [HIV],   etc.), or due to medications or treatments  (example: therapies used to treat cancer and rheumatological diseases). However, even if you do not have one on these, it can still happen. If you have any of these conditions, or take one of these drugs, make sure to notify your treating physician. 4. Nerve Damage: This is more common when the treatment is an invasive one, but it can also happen with the use of medications, such as those used in the treatment of cancer. The damage can occur to small secondary nerves, or to large primary ones, such as those in the spinal cord and brain. This damage may be temporary or permanent and it may lead to impairments that can range from temporary numbness to permanent paralysis and/or brain death. 5. Allergic Reactions: Any time a substance or material comes in contact with our body, there is the possibility of an allergic reaction. These can range from a mild skin rash (contact dermatitis) to a severe systemic reaction (anaphylactic reaction), which can result in death. 6. Death: In general, any medical intervention can result in death, most of the time due to an unforeseen complication. ____________________________________________________________________________________________  ____________________________________________________________________________________________  Blood Thinners  IMPORTANT NOTICE:  If you take any of these, make sure to notify the nursing staff.  Failure to do so may result in injury.  Recommended time intervals to stop and restart blood-thinners, before & after invasive procedures  Generic Name Brand Name Stop Time. Must be stopped at least this long before procedures. After procedures, wait at least this long before re-starting.  Abciximab Reopro 15 days 2 hrs  Alteplase Activase 10 days 10 days  Anagrelide Agrylin    Apixaban Eliquis 3 days 6 hrs  Cilostazol Pletal 3 days 5 hrs  Clopidogrel Plavix 7-10 days 2 hrs  Dabigatran Pradaxa 5 days 6 hrs  Dalteparin Fragmin 24 hours  4 hrs  Dipyridamole Aggrenox 11days 2 hrs  Edoxaban Lixiana; Savaysa 3 days 2 hrs  Enoxaparin  Lovenox 24 hours 4 hrs  Eptifibatide Integrillin 8 hours 2 hrs  Fondaparinux  Arixtra 72 hours 12 hrs  Prasugrel Effient 7-10 days 6 hrs  Reteplase Retavase 10 days 10 days  Rivaroxaban Xarelto 3 days 6 hrs  Ticagrelor Brilinta 5-7 days 6 hrs  Ticlopidine Ticlid 10-14 days 2 hrs  Tinzaparin Innohep 24 hours 4 hrs  Tirofiban Aggrastat 8 hours 2 hrs  Warfarin Coumadin 5 days 2 hrs   Other medications with blood-thinning effects  Product indications Generic (Brand) names Note  Cholesterol Lipitor Stop 4 days before procedure  Blood thinner (injectable) Heparin (LMW or LMWH Heparin) Stop 24 hours before procedure  Cancer Ibrutinib (Imbruvica) Stop 7 days before procedure  Malaria/Rheumatoid Hydroxychloroquine (Plaquenil) Stop 11 days before procedure  Thrombolytics  10 days before or after procedures   Over-the-counter (OTC) Products with blood-thinning effects  Product Common names Stop Time  Aspirin > 325 mg Goody Powders, Excedrin, etc. 11 days  Aspirin ? 81 mg  7 days  Fish oil  4 days  Garlic supplements  7 days  Ginkgo biloba  36 hours  Ginseng  24 hours  NSAIDs Ibuprofen, Naprosyn, etc. 3 days  Vitamin E  4 days   ____________________________________________________________________________________________  Facet Blocks Patient Information  Description: The facets are joints in the spine between the vertebrae.  Like any joints in the body, facets can become irritated and painful.  Arthritis can also effect the facets.  By injecting steroids and local anesthetic in and around these joints, we  temporarily block the nerve supply to them.  Steroids act directly on irritated nerves and tissues to reduce selling and inflammation which often leads to decreased pain.  Facet blocks may be done anywhere along the spine from the neck to the low back depending upon the location of your  pain.   After numbing the skin with local anesthetic (like Novocaine), a small needle is passed onto the facet joints under x-ray guidance.  You may experience a sensation of pressure while this is being done.  The entire block usually lasts about 15-25 minutes.   Conditions which may be treated by facet blocks:   Low back/buttock pain  Neck/shoulder pain  Certain types of headaches  Preparation for the injection:  1. Do not eat any solid food or dairy products within 8 hours of your appointment. 2. You may drink clear liquid up to 3 hours before appointment.  Clear liquids include water, black coffee, juice or soda.  No milk or cream please. 3. You may take your regular medication, including pain medications, with a sip of water before your appointment.  Diabetics should hold regular insulin (if taken separately) and take 1/2 normal NPH dose the morning of the procedure.  Carry some sugar containing items with you to your appointment. 4. A driver must accompany you and be prepared to drive you home after your procedure. 5. Bring all your current medications with you. 6. An IV may be inserted and sedation may be given at the discretion of the physician. 7. A blood pressure cuff, EKG and other monitors will often be applied during the procedure.  Some patients may need to have extra oxygen administered for a short period. 8. You will be asked to provide medical information, including your allergies and medications, prior to the procedure.  We must know immediately if you are taking blood thinners (like Coumadin/Warfarin) or if you are allergic to IV iodine contrast (dye).  We must know if you could possible be pregnant.  Possible side-effects:   Bleeding from needle site  Infection (rare, may require surgery)  Nerve injury (rare)  Numbness & tingling (temporary)  Difficulty urinating (rare, temporary)  Spinal headache (a headache worse with upright posture)  Light-headedness  (temporary)  Pain at injection site (serveral days)  Decreased blood pressure (rare, temporary)  Weakness in arm/leg (temporary)  Pressure sensation in back/neck (temporary)   Call if you experience:   Fever/chills associated with headache or increased back/neck pain  Headache worsened by an upright position  New onset, weakness or numbness of an extremity below the injection site  Hives or difficulty breathing (go to the emergency room)  Inflammation or drainage at the injection site(s)  Severe back/neck pain greater than usual  New symptoms which are concerning to you  Please note:  Although the local anesthetic injected can often make your back or neck feel good for several hours after the injection, the pain will likely return. It takes 3-7 days for steroids to work.  You may not notice any pain relief for at least one week.  If effective, we will often do a series of 2-3 injections spaced 3-6 weeks apart to maximally decrease your pain.  After the initial series, you may be a candidate for a more permanent nerve block of the facets.  If you have any questions, please call [phone] [facility] 

## 2019-02-19 ENCOUNTER — Other Ambulatory Visit: Payer: Medicare Other

## 2019-02-19 LAB — PROTIME-INR
INR: 1.7 — ABNORMAL HIGH
Prothrombin Time: 17.4 s — ABNORMAL HIGH (ref 9.0–11.5)

## 2019-02-20 ENCOUNTER — Encounter: Payer: Self-pay | Admitting: Internal Medicine

## 2019-02-24 ENCOUNTER — Telehealth: Payer: Self-pay

## 2019-02-24 NOTE — Telephone Encounter (Signed)
Dr Dossie Arbour would like clearance to stop Coumadin for 5 days so that patient may have a lumbar facet injection.  Please respond as soon as possible so that we may schedule this procedure for the patient. Thank you

## 2019-02-24 NOTE — Telephone Encounter (Signed)
I called pt and discussed stopping coumadin for her procedure.  States she needs the injection.  Increased pain and unable to get in her bed, etc - due to pain.  Discussed possible risk of stopping coumadin.  Discussed risk of blood clots, etc.  She is in agreement to proceed with planned procedure realizing risk of stopping coumadin.  She is aware that pain clinic will contact her with procedure appt time and will notify her when she is able to start back on coumadin.  Thanks

## 2019-02-25 ENCOUNTER — Other Ambulatory Visit: Payer: Self-pay | Admitting: *Deleted

## 2019-02-25 ENCOUNTER — Telehealth: Payer: Self-pay

## 2019-02-25 DIAGNOSIS — Z7901 Long term (current) use of anticoagulants: Secondary | ICD-10-CM

## 2019-02-25 NOTE — Telephone Encounter (Signed)
Faxed to [facility]

## 2019-02-25 NOTE — Telephone Encounter (Signed)
Spoke with patient. She is scheduled to have facet block done on 03/11/19. Needs to stop coumadin 5 days prior- 03/06/19. Restart 2 hours after procedure. Form placed out for you to sign.

## 2019-02-25 NOTE — Telephone Encounter (Signed)
Signed and placed on your desk.

## 2019-02-25 NOTE — Telephone Encounter (Signed)
Pt returned Leeper call. She's scheduled for 03/11/19 @ 9:30am for a procedure. Pt is aware to stop her Coumadin on 03/06/19. Please return her call and give her pre procedure instructions.                                                   Thanks

## 2019-02-25 NOTE — Telephone Encounter (Signed)
Received permission to stop Coumadin for 5 days per Dr Nicki Reaper.  Please schedule for procedure.  I attempted to call patient to give her pre procedure instructions, but she said she would have to call us back.  So when she is scheduled, have a nurse speak with her to give her pre procedure instructions.

## 2019-02-25 NOTE — Progress Notes (Signed)
Standing orders placed for INR lab

## 2019-03-03 ENCOUNTER — Telehealth: Payer: Self-pay | Admitting: *Deleted

## 2019-03-03 ENCOUNTER — Other Ambulatory Visit: Payer: Self-pay

## 2019-03-03 ENCOUNTER — Other Ambulatory Visit (INDEPENDENT_AMBULATORY_CARE_PROVIDER_SITE_OTHER): Payer: Medicare Other

## 2019-03-03 DIAGNOSIS — Z7901 Long term (current) use of anticoagulants: Secondary | ICD-10-CM | POA: Diagnosis not present

## 2019-03-03 LAB — PROTIME-INR
INR: 2 ratio — ABNORMAL HIGH (ref 0.8–1.0)
Prothrombin Time: 23.1 s — ABNORMAL HIGH (ref 9.6–13.1)

## 2019-03-03 NOTE — Telephone Encounter (Signed)
Spoke with patient today to let her know that we have her appt scheduled for 03/10/18 @ 0930 for bilateral lumbar facet.  Sedation prior to procedure instructions reviewed with patient and she does verbalize u/o instructions of being NPO past midnight and bringing a responsible driver to the appt.  Also, she understands that she needs to stop her Coumadin on 03/06/19.

## 2019-03-04 NOTE — Progress Notes (Signed)
Patient is having injection in her back and she is to stop coumadin on 03/06/19 for five days so would still want to recheck on the 8 th which would be patient only back on coumadin for 2 days at that time?

## 2019-03-11 ENCOUNTER — Ambulatory Visit
Admission: RE | Admit: 2019-03-11 | Discharge: 2019-03-11 | Disposition: A | Discharge status: Home / Self Care | Payer: Medicare Other | Source: Ambulatory Visit | Attending: Pain Medicine | Admitting: Pain Medicine

## 2019-03-11 ENCOUNTER — Encounter: Payer: Self-pay | Admitting: Pain Medicine

## 2019-03-11 ENCOUNTER — Ambulatory Visit (HOSPITAL_BASED_OUTPATIENT_CLINIC_OR_DEPARTMENT_OTHER): Payer: Medicare Other | Admitting: Pain Medicine

## 2019-03-11 ENCOUNTER — Other Ambulatory Visit: Payer: Self-pay

## 2019-03-11 VITALS — BP 154/77 | HR 83 | Temp 96.9°F | Resp 18 | Ht 69.0 in | Wt 248.0 lb

## 2019-03-11 DIAGNOSIS — M545 Low back pain: Secondary | ICD-10-CM | POA: Diagnosis not present

## 2019-03-11 DIAGNOSIS — M47816 Spondylosis without myelopathy or radiculopathy, lumbar region: Secondary | ICD-10-CM

## 2019-03-11 DIAGNOSIS — Z7901 Long term (current) use of anticoagulants: Secondary | ICD-10-CM | POA: Insufficient documentation

## 2019-03-11 DIAGNOSIS — M4696 Unspecified inflammatory spondylopathy, lumbar region: Secondary | ICD-10-CM | POA: Diagnosis not present

## 2019-03-11 DIAGNOSIS — M47817 Spondylosis without myelopathy or radiculopathy, lumbosacral region: Secondary | ICD-10-CM | POA: Diagnosis not present

## 2019-03-11 DIAGNOSIS — G8929 Other chronic pain: Secondary | ICD-10-CM | POA: Diagnosis not present

## 2019-03-11 MED ORDER — MIDAZOLAM HCL 5 MG/5ML IJ SOLN
INTRAMUSCULAR | Status: AC
  Filled 2019-03-11: qty 5

## 2019-03-11 MED ORDER — FENTANYL CITRATE (PF) 100 MCG/2ML IJ SOLN
25.0000 ug | INTRAMUSCULAR | Status: DC | PRN
  Administered 2019-03-11: 10:00:00 50 ug via INTRAVENOUS

## 2019-03-11 MED ORDER — MIDAZOLAM HCL 5 MG/5ML IJ SOLN
1.0000 mg | INTRAMUSCULAR | Status: DC | PRN
  Administered 2019-03-11: 1 mg via INTRAVENOUS

## 2019-03-11 MED ORDER — LACTATED RINGERS IV SOLN
1000.0000 mL | Freq: Once | INTRAVENOUS | Status: AC
  Administered 2019-03-11: 1000 mL via INTRAVENOUS

## 2019-03-11 MED ORDER — ROPIVACAINE HCL 2 MG/ML IJ SOLN
INTRAMUSCULAR | Status: AC
  Filled 2019-03-11: qty 20

## 2019-03-11 MED ORDER — FENTANYL CITRATE (PF) 100 MCG/2ML IJ SOLN
INTRAMUSCULAR | Status: AC
  Filled 2019-03-11: qty 2

## 2019-03-11 MED ORDER — LIDOCAINE HCL 2 % IJ SOLN
20.0000 mL | Freq: Once | INTRAMUSCULAR | Status: AC
  Administered 2019-03-11: 400 mg

## 2019-03-11 MED ORDER — TRIAMCINOLONE ACETONIDE 40 MG/ML IJ SUSP
40.0000 mg | Freq: Once | INTRAMUSCULAR | Status: AC
  Administered 2019-03-11: 40 mg

## 2019-03-11 MED ORDER — ROPIVACAINE HCL 2 MG/ML IJ SOLN
9.0000 mL | Freq: Once | INTRAMUSCULAR | Status: AC
  Administered 2019-03-11: 9 mL via PERINEURAL

## 2019-03-11 MED ORDER — TRIAMCINOLONE ACETONIDE 40 MG/ML IJ SUSP
INTRAMUSCULAR | Status: AC
  Filled 2019-03-11: qty 2

## 2019-03-11 MED ORDER — LIDOCAINE HCL 2 % IJ SOLN
INTRAMUSCULAR | Status: AC
  Filled 2019-03-11: qty 20

## 2019-03-11 NOTE — Progress Notes (Signed)
PROVIDER NOTE: Information contained herein reflects review and annotations entered in association with encounter. Interpretation of such information and data should be left to medically-trained personnel. Information provided to patient can be located elsewhere in the medical record under "Patient Instructions". Document created using STT-dictation technology, any transcriptional errors that may result from process are unintentional.   Patient's Name: Anita Walker  MRN: [id number]  Referring Provider: Milinda Pointer, MD  DOB: 1940/09/12  PCP: Einar Pheasant, MD  DOS: 03/11/2019  Note by: Gaspar Cola, MD  Service setting: Ambulatory outpatient  Specialty: Interventional Pain Management  Patient type: Established  Location: [facility] (AMB) Pain Management Facility  Visit type: Interventional Procedure   Primary Reason for Visit: Interventional Pain Management Treatment. CC: Back Pain (low and bilateral)  Procedure:          Anesthesia, Analgesia, Anxiolysis:  Type: Lumbar Facet, Medial Branch Block(s) #1  Primary Purpose: Diagnostic Region: Posterolateral Lumbosacral Spine Level: L2, L3, L4, L5, & S1 Medial Branch Level(s). Injecting these levels blocks the L3-4, L4-5, and L5-S1 lumbar facet joints. Laterality: Right  Type: Moderate (Conscious) Sedation combined with Local Anesthesia Indication(s): Analgesia and Anxiety Route: Intravenous (IV) IV Access: Secured Sedation: Meaningful verbal contact was maintained at all times during the procedure  Local Anesthetic: Lidocaine 1-2%  Position: Prone   Indications: 1. Chronic low back pain (Primary Area of Pain) (Bilateral) (R>L) w/o sciatica   2. Lumbar facet syndrome (Bilateral) (R>L)   3. Spondylosis without myelopathy or radiculopathy, lumbosacral region   4. Lumbar facet arthropathy (Multilevel)   5. Chronic anticoagulation (Coumadin)    Pain Score: Pre-procedure: 2 /10 Post-procedure: 0-No pain/10   According to the  patient, she has had multiple falls the last of which appears to have been around August 07, 2018.  She started experiencing low back pain primarily on the right side and x-rays revealed that she apparently has a lumbar vertebral body fracture.  An MRI also reveals that the L3 compression fracture seems to be old and it does have some retropulsion.  The same MRI also reveals that the patient has bilateral lumbar facet arthropathy.  She describes her pain as being primarily in the right side of the lower back and worsening when she stands up for period time.  She denies any lower extremity pain, numbness, or weakness.  The pain comes and goes and she was referred to [place] by Whitney L. Meeler, FNP-C for the purpose of having a bilateral L4-5 and L5-S1 lumbar facet blocks.  Since the patient's 10/05/2018 lumbar MRI shows moderate bilateral lumbar facet disease at the L3-4 level and statistically 98% of the facet syndromes in the lumbosacral area involve the L3-4, L4-5, and L5-S1 facet joints, today we will be blocking those 3 levels.  Of significance is the fact that the patient has a history of Coumadin anticoagulation secondary to blood clots that she had approximately 9 years ago.  She refers having stoped the Coumadin in the past for the purpose of having surgeries or procedures, without any apparent complications.  Pre-op Assessment:  Ms. Walker is a 79 y.o. (year old), female patient, seen today for interventional treatment. She  has a past surgical history that includes Cataract extraction w/PHACO (Right, 12/26/2016); Cataract extraction w/PHACO (Left, 01/23/2017); and Breast lumpectomy (03/06/1990). Ms. Walker has a current medication list which includes the following prescription(s): amlodipine, contour test, diphenhydramine, ferrous sulfate, losartan, multivitamin with minerals, pantoprazole, simvastatin, and warfarin, and the following Facility-Administered Medications: fentanyl and midazolam. Her  primarily  concern today is the Back Pain (low and bilateral)  Initial Vital Signs:  Pulse/HCG Rate: 83ECG Heart Rate: 75 Temp: 98.2 F (36.8 C) Resp: 20 BP: (!) 162/73 SpO2: 98 %  BMI: Estimated body mass index is 36.62 kg/m as calculated from the following:   Height as of this encounter: 5\' 9"  (1.753 m).   Weight as of this encounter: 248 lb (112.5 kg).  Risk Assessment: Allergies: Reviewed. She is allergic to penicillins and penicillin v potassium.  Allergy Precautions: None required Coagulopathies: Reviewed. None identified.  Blood-thinner therapy: None at this time Active Infection(s): Reviewed. None identified. Ms. Walker is afebrile  Site Confirmation: Ms. Walker was asked to confirm the procedure and laterality before marking the site Procedure checklist: Completed Consent: Before the procedure and under the influence of no sedative(s), amnesic(s), or anxiolytics, the patient was informed of the treatment options, risks and possible complications. To fulfill our ethical and legal obligations, as recommended by the American Medical Association's Code of Ethics, I have informed the patient of my clinical impression; the nature and purpose of the treatment or procedure; the risks, benefits, and possible complications of the intervention; the alternatives, including doing nothing; the risk(s) and benefit(s) of the alternative treatment(s) or procedure(s); and the risk(s) and benefit(s) of doing nothing. The patient was provided information about the general risks and possible complications associated with the procedure. These may include, but are not limited to: failure to achieve desired goals, infection, bleeding, organ or nerve damage, allergic reactions, paralysis, and death. In addition, the patient was informed of those risks and complications associated to Spine-related procedures, such as failure to decrease pain; infection (i.e.: Meningitis, epidural or intraspinal abscess); bleeding  (i.e.: epidural hematoma, subarachnoid hemorrhage, or any other type of intraspinal or peri-dural bleeding); organ or nerve damage (i.e.: Any type of peripheral nerve, nerve root, or spinal cord injury) with subsequent damage to sensory, motor, and/or autonomic systems, resulting in permanent pain, numbness, and/or weakness of one or several areas of the body; allergic reactions; (i.e.: anaphylactic reaction); and/or death. Furthermore, the patient was informed of those risks and complications associated with the medications. These include, but are not limited to: allergic reactions (i.e.: anaphylactic or anaphylactoid reaction(s)); adrenal axis suppression; blood sugar elevation that in diabetics may result in ketoacidosis or comma; water retention that in patients with history of congestive heart failure may result in shortness of breath, pulmonary edema, and decompensation with resultant heart failure; weight gain; swelling or edema; medication-induced neural toxicity; particulate matter embolism and blood vessel occlusion with resultant organ, and/or nervous system infarction; and/or aseptic necrosis of one or more joints. Finally, the patient was informed that Medicine is not an exact science; therefore, there is also the possibility of unforeseen or unpredictable risks and/or possible complications that may result in a catastrophic outcome. The patient indicated having understood very clearly. We have given the patient no guarantees and we have made no promises. Enough time was given to the patient to ask questions, all of which were answered to the patient's satisfaction. Ms. Walker has indicated that she wanted to continue with the procedure. Attestation: I, the ordering provider, attest that I have discussed with the patient the benefits, risks, side-effects, alternatives, likelihood of achieving goals, and potential problems during recovery for the procedure that I have provided informed consent. Date   Time: 03/11/2019  9:22 AM  Pre-Procedure Preparation:  Monitoring: As per clinic protocol. Respiration, ETCO2, SpO2, BP, heart rate and rhythm monitor placed and  checked for adequate function Safety Precautions: Patient was assessed for positional comfort and pressure points before starting the procedure. Time-out: I initiated and conducted the "Time-out" before starting the procedure, as per protocol. The patient was asked to participate by confirming the accuracy of the "Time Out" information. Verification of the correct person, site, and procedure were performed and confirmed by me, the nursing staff, and the patient. "Time-out" conducted as per Joint Commission's Universal Protocol (UP.01.01.01). Time: 1009  Description of Procedure:          Laterality: Right Levels:  L2, L3, L4, L5, & S1 Medial Branch Level(s) Area Prepped: Posterior Lumbosacral Region Prepping solution: DuraPrep (Iodine Povacrylex [0.7% available iodine] and Isopropyl Alcohol, 74% w/w) Safety Precautions: Aspiration looking for blood return was conducted prior to all injections. At no point did we inject any substances, as a needle was being advanced. Before injecting, the patient was told to immediately notify me if she was experiencing any new onset of "ringing in the ears, or metallic taste in the mouth". No attempts were made at seeking any paresthesias. Safe injection practices and needle disposal techniques used. Medications properly checked for expiration dates. SDV (single dose vial) medications used. After the completion of the procedure, all disposable equipment used was discarded in the proper designated medical waste containers. Local Anesthesia: Protocol guidelines were followed. The patient was positioned over the fluoroscopy table. The area was prepped in the usual manner. The time-out was completed. The target area was identified using fluoroscopy. A 12-in long, straight, sterile hemostat was used with  fluoroscopic guidance to locate the targets for each level blocked. Once located, the skin was marked with an approved surgical skin marker. Once all sites were marked, the skin (epidermis, dermis, and hypodermis), as well as deeper tissues (fat, connective tissue and muscle) were infiltrated with a small amount of a short-acting local anesthetic, loaded on a 10cc syringe with a 25G, 1.5-in  Needle. An appropriate amount of time was allowed for local anesthetics to take effect before proceeding to the next step. Local Anesthetic: Lidocaine 2.0% The unused portion of the local anesthetic was discarded in the proper designated containers. Technical explanation of process:  L2 Medial Branch Nerve Block (MBB): The target area for the L2 medial branch is at the junction of the postero-lateral aspect of the superior articular process and the superior, posterior, and medial edge of the transverse process of L3. Under fluoroscopic guidance, a Quincke needle was inserted until contact was made with os over the superior postero-lateral aspect of the pedicular shadow (target area). After negative aspiration for blood, 0.5 mL of the nerve block solution was injected without difficulty or complication. The needle was removed intact. L3 Medial Branch Nerve Block (MBB): The target area for the L3 medial branch is at the junction of the postero-lateral aspect of the superior articular process and the superior, posterior, and medial edge of the transverse process of L4. Under fluoroscopic guidance, a Quincke needle was inserted until contact was made with os over the superior postero-lateral aspect of the pedicular shadow (target area). After negative aspiration for blood, 0.5 mL of the nerve block solution was injected without difficulty or complication. The needle was removed intact. L4 Medial Branch Nerve Block (MBB): The target area for the L4 medial branch is at the junction of the postero-lateral aspect of the superior  articular process and the superior, posterior, and medial edge of the transverse process of L5. Under fluoroscopic guidance, a Quincke needle was inserted  until contact was made with os over the superior postero-lateral aspect of the pedicular shadow (target area). After negative aspiration for blood, 0.5 mL of the nerve block solution was injected without difficulty or complication. The needle was removed intact. L5 Medial Branch Nerve Block (MBB): The target area for the L5 medial branch is at the junction of the postero-lateral aspect of the superior articular process and the superior, posterior, and medial edge of the sacral ala. Under fluoroscopic guidance, a Quincke needle was inserted until contact was made with os over the superior postero-lateral aspect of the pedicular shadow (target area). After negative aspiration for blood, 0.5 mL of the nerve block solution was injected without difficulty or complication. The needle was removed intact. S1 Medial Branch Nerve Block (MBB): The target area for the S1 medial branch is at the posterior and inferior 6 o'clock position of the L5-S1 facet joint. Under fluoroscopic guidance, the Quincke needle inserted for the L5 MBB was redirected until contact was made with os over the inferior and postero aspect of the sacrum, at the 6 o' clock position under the L5-S1 facet joint (Target area). After negative aspiration for blood, 0.5 mL of the nerve block solution was injected without difficulty or complication. The needle was removed intact.  Nerve block solution: 0.2% PF-Ropivacaine + Triamcinolone (40 mg/mL) diluted to a final concentration of 4 mg of Triamcinolone/mL of Ropivacaine The unused portion of the solution was discarded in the proper designated containers. Procedural Needles: 22-gauge, 3.5-inch, Quincke needles used for all levels.  Once the entire procedure was completed, the treated area was cleaned, making sure to leave some of the prepping solution  back to take advantage of its long term bactericidal properties.   Illustration of the posterior view of the lumbar spine and the posterior neural structures. Laminae of L2 through S1 are labeled. DPRL5, dorsal primary ramus of L5; DPRS1, dorsal primary ramus of S1; DPR3, dorsal primary ramus of L3; FJ, facet (zygapophyseal) joint L3-L4; I, inferior articular process of L4; LB1, lateral branch of dorsal primary ramus of L1; IAB, inferior articular branches from L3 medial branch (supplies L4-L5 facet joint); IBP, intermediate branch plexus; MB3, medial branch of dorsal primary ramus of L3; NR3, third lumbar nerve root; S, superior articular process of L5; SAB, superior articular branches from L4 (supplies L4-5 facet joint also); TP3, transverse process of L3.  Vitals:   03/11/19 1015 03/11/19 1025 03/11/19 1035 03/11/19 1045  BP: (!) 155/89 (!) 147/64 (!) 142/65 (!) 154/77  Pulse:      Resp: 16 15 14 18   Temp:  97.8 F (36.6 C)  (!) 96.9 F (36.1 C)  TempSrc:  Temporal  Temporal  SpO2: 99% 99% 98% 100%  Weight:      Height:         Start Time: 1009 hrs. End Time: 1015 hrs.  Imaging Guidance (Spinal):          Type of Imaging Technique: Fluoroscopy Guidance (Spinal) Indication(s): Assistance in needle guidance and placement for procedures requiring needle placement in or near specific anatomical locations not easily accessible without such assistance. Exposure Time: Please see nurses notes. Contrast: None used. Fluoroscopic Guidance: I was personally present during the use of fluoroscopy. "Tunnel Vision Technique" used to obtain the best possible view of the target area. Parallax error corrected before commencing the procedure. "Direction-depth-direction" technique used to introduce the needle under continuous pulsed fluoroscopy. Once target was reached, antero-posterior, oblique, and lateral fluoroscopic projection used confirm needle  placement in all planes. Images permanently stored in  EMR. Interpretation: No contrast injected. I personally interpreted the imaging intraoperatively. Adequate needle placement confirmed in multiple planes. Permanent images saved into the patient's record.  Antibiotic Prophylaxis:   Anti-infectives (From admission, onward)   None     Indication(s): None identified  Post-operative Assessment:  Post-procedure Vital Signs:  Pulse/HCG Rate: 8372 Temp: (!) 96.9 F (36.1 C) Resp: 18 BP: (!) 154/77 SpO2: 100 %  EBL: None  Complications: No immediate post-treatment complications observed by team, or reported by patient.  Note: The patient tolerated the entire procedure well. A repeat set of vitals were taken after the procedure and the patient was kept under observation following institutional policy, for this type of procedure. Post-procedural neurological assessment was performed, showing return to baseline, prior to discharge. The patient was provided with post-procedure discharge instructions, including a section on how to identify potential problems. Should any problems arise concerning this procedure, the patient was given instructions to immediately contact us, at any time, without hesitation. In any case, we plan to contact the patient by telephone for a follow-up status report regarding this interventional procedure.  Comments:  No additional relevant information.  Plan of Care  Orders:  Orders Placed This Encounter  Procedures  . L-FCT Blk (Today)    Scheduling Instructions:     Procedure: Lumbar facet block (AKA.: Lumbosacral medial branch nerve block)     Side: Right     Level: L3-4, L4-5, & L5-S1 Facets (L2, L3, L4, L5, & S1 Medial Branch Nerves)     Sedation: Patient's choice.     Timeframe: Today    Order Specific Question:   Where will this procedure be performed?    Answer:   [facility]  . Fluoro (C-Arm) (<60 min) (No Report)    Intraoperative interpretation by procedural physician at [place].     Standing Status:   Standing    Number of Occurrences:   1    Order Specific Question:   Reason for exam:    Answer:   Assistance in needle guidance and placement for procedures requiring needle placement in or near specific anatomical locations not easily accessible without such assistance.  . Consent: L-FCT BLK    Nursing Order: Transcribe to consent form and obtain patient signature. Note: Always confirm laterality of pain with Ms. Walker, before procedure. Procedure: Lumbar Facet Block  under fluoroscopic guidance Indication/Reason: Low Back Pain, with our without leg pain, due to Facet Joint Arthralgia (Joint Pain) known as Lumbar Facet Syndrome, secondary to Lumbar, and/or Lumbosacral Spondylosis (Arthritis of the Spine), without myelopathy or radiculopathy (Nerve Damage). Provider Attestation: I, [place] D[place] Arbour, MD, (Pain Management Specialist), the physician/practitioner, attest that I have discussed with the patient the benefits, risks, side effects, alternatives, likelihood of achieving goals and potential problems during recovery for the procedure that I have provided informed consent.  . Care order/instruction: Please confirm that the patient has stopped the Coumadin (Warfarin) X 5 days prior to procedure or surgery.    Please confirm that the patient has stopped the Coumadin (Warfarin) X 5 days prior to procedure or surgery.    Standing Status:   Standing    Number of Occurrences:   1  . Block Tray    Equipment required: Single use, disposable, "Block Tray"    Standing Status:   Standing    Number of Occurrences:   1    Order Specific Question:   Specify  Answer:   Block Tray  . Bleeding precautions    Standing Status:   Standing    Number of Occurrences:   1   Chronic Opioid Analgesic:  None Highest recorded MME/day: 25 mg/day MME/day: 0 mg/day   Medications ordered for procedure: Meds ordered this encounter  Medications  . lidocaine (XYLOCAINE) 2 % (with pres)  injection 400 mg  . lactated ringers infusion 1,000 mL  . midazolam (VERSED) 5 MG/5ML injection 1-2 mg    Make sure Flumazenil is available in the pyxis when using this medication. If oversedation occurs, administer 0.2 mg IV over 15 sec. If after 45 sec no response, administer 0.2 mg again over 1 min; may repeat at 1 min intervals; not to exceed 4 doses (1 mg)  . fentaNYL (SUBLIMAZE) injection 25-50 mcg    Make sure Narcan is available in the pyxis when using this medication. In the event of respiratory depression (RR< 8/min): Titrate NARCAN (naloxone) in increments of 0.1 to 0.2 mg IV at 2-3 minute intervals, until desired degree of reversal.  . ropivacaine (PF) 2 mg/mL (0.2%) (NAROPIN) injection 9 mL  . triamcinolone acetonide (KENALOG-40) injection 40 mg   Medications administered: We administered lidocaine, lactated ringers, midazolam, fentaNYL, ropivacaine (PF) 2 mg/mL (0.2%), and triamcinolone acetonide.  See the medical record for exact dosing, route, and time of administration.  Follow-up plan:   Return in about 2 weeks (around 03/25/2019) for (VV), (PP).       Interventional treatment options: Planned, scheduled, and/or pending:   Diagnostic bilateral lumbar facet block #1 under fluoroscopic guidance and IV sedation (today)   Under consideration:  NOTE: COUMADIN ANTICOAGULATION (Stop: 5 days  Restart: 2 hrs)  Diagnostic bilateral vs right-sided L4-5 and L5-S1 lumbar facet block #1 under fluoroscopic guidance and IV sedation   Therapeutic/palliative (PRN):   None at this time    Recent Visits Date Type Provider Dept  02/18/19 [place], [place], MD [facility]  Showing recent visits within past 90 days and meeting all other requirements   Today's Visits Date Type Provider Dept  03/11/19 Procedure visit Milinda Pointer, MD [facility]  Showing today's visits and meeting all other requirements   Future Appointments Date Type Provider  Dept  03/26/19 Appointment Milinda Pointer, MD [facility]  Showing future appointments within next 90 days and meeting all other requirements   Disposition: Discharge home  Discharge Date & Time: 03/11/2019; 1058 hrs.   Primary Care Physician: Einar Pheasant, MD Location: [facility] Note by: Gaspar Cola, MD Date: 03/11/2019; Time: 11:01 AM  Disclaimer:  Medicine is not an Chief Strategy Officer. The only guarantee in medicine is that nothing is guaranteed. It is important to note that the decision to proceed with this intervention was based on the information collected from the patient. The Data and conclusions were drawn from the patient's questionnaire, the interview, and the physical examination. Because the information was provided in large part by the patient, it cannot be guaranteed that it has not been purposely or unconsciously manipulated. Every effort has been made to obtain as much relevant data as possible for this evaluation. It is important to note that the conclusions that lead to this procedure are derived in large part from the available data. Always take into account that the treatment will also be dependent on availability of resources and existing treatment guidelines, considered by other Pain Management Practitioners as being common knowledge and practice, at the time of  the intervention. For Medico-Legal purposes, it is also important to point out that variation in procedural techniques and pharmacological choices are the acceptable norm. The indications, contraindications, technique, and results of the above procedure should only be interpreted and judged by a Board-Certified Interventional Pain Specialist with extensive familiarity and expertise in the same exact procedure and technique.

## 2019-03-11 NOTE — Patient Instructions (Addendum)
____________________________________________________________________________________________  Post-Procedure Discharge Instructions  Instructions:  Apply ice:   Purpose: This will minimize any swelling and discomfort after procedure.   When: Day of procedure, as soon as you get home.  How: Fill a plastic sandwich bag with crushed ice. Cover it with a small towel and apply to injection site.  How long: (15 min on, 15 min off) Apply for 15 minutes then remove x 15 minutes.  Repeat sequence on day of procedure, until you go to bed.  Apply heat:   Purpose: To treat any soreness and discomfort from the procedure.  When: Starting the next day after the procedure.  How: Apply heat to procedure site starting the day following the procedure.  How long: May continue to repeat daily, until discomfort goes away.  Food intake: Start with clear liquids (like water) and advance to regular food, as tolerated.   Physical activities: Keep activities to a minimum for the first 8 hours after the procedure. After that, then as tolerated.  Driving: If you have received any sedation, be responsible and do not drive. You are not allowed to drive for 24 hours after having sedation.  Blood thinner: (Applies only to those taking blood thinners) You may restart your blood thinner 6 hours after your procedure.  Insulin: (Applies only to Diabetic patients taking insulin) As soon as you can eat, you may resume your normal dosing schedule.  Infection prevention: Keep procedure site clean and dry. Shower daily and clean area with soap and water.  Post-procedure Pain Diary: Extremely important that this be done correctly and accurately. Recorded information will be used to determine the next step in treatment. For the purpose of accuracy, follow these rules:  Evaluate only the area treated. Do not report or include pain from an untreated area. For the purpose of this evaluation, ignore all other areas of pain,  except for the treated area.  After your procedure, avoid taking a long nap and attempting to complete the pain diary after you wake up. Instead, set your alarm clock to go off every hour, on the hour, for the initial 8 hours after the procedure. Document the duration of the numbing medicine, and the relief you are getting from it.  Do not go to sleep and attempt to complete it later. It will not be accurate. If you received sedation, it is likely that you were given a medication that may cause amnesia. Because of this, completing the diary at a later time may cause the information to be inaccurate. This information is needed to plan your care.  Follow-up appointment: Keep your post-procedure follow-up evaluation appointment after the procedure (usually 2 weeks for most procedures, 6 weeks for radiofrequencies). DO NOT FORGET to bring you pain diary with you.   Expect: (What should I expect to see with my procedure?)  From numbing medicine (AKA: Local Anesthetics): Numbness or decrease in pain. You may also experience some weakness, which if present, could last for the duration of the local anesthetic.  Onset: Full effect within 15 minutes of injected.  Duration: It will depend on the type of local anesthetic used. On the average, 1 to 8 hours.   From steroids (Applies only if steroids were used): Decrease in swelling or inflammation. Once inflammation is improved, relief of the pain will follow.  Onset of benefits: Depends on the amount of swelling present. The more swelling, the longer it will take for the benefits to be seen. In some cases, up to 10 days.    Duration: Steroids will stay in the system x 2 weeks. Duration of benefits will depend on multiple posibilities including persistent irritating factors.  Side-effects: If present, they may typically last 2 weeks (the duration of the steroids).  Frequent: Cramps (if they occur, drink Gatorade and take over-the-counter Magnesium 450-500 mg  once to twice a day); water retention with temporary weight gain; increases in blood sugar; decreased immune system response; increased appetite.  Occasional: Facial flushing (red, warm cheeks); mood swings; menstrual changes.  Uncommon: Long-term decrease or suppression of natural hormones; bone thinning. (These are more common with higher doses or more frequent use. This is why we prefer that our patients avoid having any injection therapies in other practices.)   Very Rare: Severe mood changes; psychosis; aseptic necrosis.  From procedure: Some discomfort is to be expected once the numbing medicine wears off. This should be minimal if ice and heat are applied as instructed.  Call if: (When should I call?)  You experience numbness and weakness that gets worse with time, as opposed to wearing off.  New onset bowel or bladder incontinence. (Applies only to procedures done in the spine)  Emergency Numbers:  Durning business hours (Monday - Thursday, 8:00 AM - 4:00 PM) (Friday, 9:00 AM - 12:00 Noon): [phone]  After hours: [phone]  NOTE: If you are having a problem and are unable connect with, or to talk to a provider, then go to your nearest urgent care or emergency department. If the problem is serious and urgent, please call 911. ____________________________________________________________________________________________   ____________________________________________________________________________________________  Blood Thinners  IMPORTANT NOTICE:  If you take any of these, make sure to notify the nursing staff.  Failure to do so may result in injury.  Recommended time intervals to stop and restart blood-thinners, before & after invasive procedures  Generic Name Brand Name Stop Time. Must be stopped at least this long before procedures. After procedures, wait at least this long before re-starting.  Abciximab Reopro 15 days 2 hrs  Alteplase Activase 10 days 10 days   Anagrelide Agrylin    Apixaban Eliquis 3 days 6 hrs  Cilostazol Pletal 3 days 5 hrs  Clopidogrel Plavix 7-10 days 2 hrs  Dabigatran Pradaxa 5 days 6 hrs  Dalteparin Fragmin 24 hours 4 hrs  Dipyridamole Aggrenox 11days 2 hrs  Edoxaban Lixiana; Savaysa 3 days 2 hrs  Enoxaparin  Lovenox 24 hours 4 hrs  Eptifibatide Integrillin 8 hours 2 hrs  Fondaparinux  Arixtra 72 hours 12 hrs  Prasugrel Effient 7-10 days 6 hrs  Reteplase Retavase 10 days 10 days  Rivaroxaban Xarelto 3 days 6 hrs  Ticagrelor Brilinta 5-7 days 6 hrs  Ticlopidine Ticlid 10-14 days 2 hrs  Tinzaparin Innohep 24 hours 4 hrs  Tirofiban Aggrastat 8 hours 2 hrs  Warfarin Coumadin 5 days 2 hrs   Other medications with blood-thinning effects  Product indications Generic (Brand) names Note  Cholesterol Lipitor Stop 4 days before procedure  Blood thinner (injectable) Heparin (LMW or LMWH Heparin) Stop 24 hours before procedure  Cancer Ibrutinib (Imbruvica) Stop 7 days before procedure  Malaria/Rheumatoid Hydroxychloroquine (Plaquenil) Stop 11 days before procedure  Thrombolytics  10 days before or after procedures   Over-the-counter (OTC) Products with blood-thinning effects  Product Common names Stop Time  Aspirin > 325 mg Goody Powders, Excedrin, etc. 11 days  Aspirin ? 81 mg  7 days  Fish oil  4 days  Garlic supplements  7 days  Ginkgo biloba  36 hours  Ginseng  24 hours  NSAIDs Ibuprofen, Naprosyn, etc. 3 days  Vitamin E  4 days   ____________________________________________________________________________________________

## 2019-03-12 ENCOUNTER — Telehealth: Payer: Self-pay | Admitting: *Deleted

## 2019-03-12 NOTE — Telephone Encounter (Signed)
Denies post procedure issues.

## 2019-03-13 ENCOUNTER — Ambulatory Visit (INDEPENDENT_AMBULATORY_CARE_PROVIDER_SITE_OTHER): Payer: Medicare Other | Admitting: Physician Assistant

## 2019-03-13 ENCOUNTER — Encounter: Payer: Self-pay | Admitting: Physician Assistant

## 2019-03-13 ENCOUNTER — Other Ambulatory Visit: Payer: Self-pay

## 2019-03-13 VITALS — BP 182/90 | HR 85 | Ht 69.0 in | Wt 248.0 lb

## 2019-03-13 DIAGNOSIS — N3941 Urge incontinence: Secondary | ICD-10-CM | POA: Diagnosis not present

## 2019-03-13 NOTE — Progress Notes (Signed)
PTNS  Session # monthly maintenance  Health & Social Factors: no change Caffeine: 2 Alcohol: 0 Daytime voids #per day: 6-8 Night-time voids #per night: 4 Urgency: none Incontinence Episodes #per day: 2 Ankle used: right Treatment Setting: 15 Feeling/ Response: both Comments: Patient reports increased nocturia. She denies dysuria or other changes in her voiding symptoms. She stops drinking fluids daily around 6pm and is unsure when she goes to sleep. She states she only drinks water with supper. We discussed common contributory factors including sleep apnea and lower extremity swelling today. She reports she does not know if she snores and she has never had a sleep study. I encouraged her to discuss obtaining a sleep study with her PCP, however she expressed disbelief that she is having difficulty breathing at night because she sleeps in a recliner. She has significant lower extremity edema, L>R, however she does not wear compression hose because she has difficulty putting them on. She states her PCP knows that she is not wearing these. Unfortunately, I am unable to offer her anything else for further management or evaluation of this symptom.  Performed By: Debroah Loop, PA-C   Assistant: Verlene Mayer, CMA  Follow Up: Return in about 4 weeks (around 04/10/2019) for PTNS maintenance.

## 2019-03-17 ENCOUNTER — Ambulatory Visit: Payer: Medicare Other | Admitting: Physician Assistant

## 2019-03-19 ENCOUNTER — Telehealth: Payer: Self-pay | Admitting: Internal Medicine

## 2019-03-19 NOTE — Telephone Encounter (Signed)
Left message for patient to call back and schedule Medicare Annual Wellness Visit (AWV) either virtually/audio only.  Last AWV 1.8.2020; please schedule at anytime with Denisa O'Brien-Blaney at [facility] for [facility] to schedule

## 2019-03-20 ENCOUNTER — Other Ambulatory Visit (INDEPENDENT_AMBULATORY_CARE_PROVIDER_SITE_OTHER): Payer: Medicare Other

## 2019-03-20 ENCOUNTER — Other Ambulatory Visit: Payer: Self-pay

## 2019-03-20 DIAGNOSIS — Z7901 Long term (current) use of anticoagulants: Secondary | ICD-10-CM

## 2019-03-20 LAB — PROTIME-INR
INR: 1.5 ratio — ABNORMAL HIGH (ref 0.8–1.0)
Prothrombin Time: 17.4 s — ABNORMAL HIGH (ref 9.6–13.1)

## 2019-03-21 ENCOUNTER — Ambulatory Visit (INDEPENDENT_AMBULATORY_CARE_PROVIDER_SITE_OTHER): Payer: Medicare Other

## 2019-03-21 VITALS — Ht 69.0 in | Wt 248.0 lb

## 2019-03-21 DIAGNOSIS — Z0000 Encounter for general adult medical examination without abnormal findings: Secondary | ICD-10-CM

## 2019-03-21 NOTE — Progress Notes (Addendum)
Subjective:   Anita Walker is a 79 y.o. female who presents for Medicare Annual (Subsequent) preventive examination.  Review of Systems:  No ROS.  Medicare Wellness Virtual Visit.  Visual/audio telehealth visit, UTA vital signs.   Wt/Ht provided.  See social history for additional risk factors.   Cardiac Risk Factors include: advanced age (>104men, >17 women);hypertension;diabetes mellitus     Objective:     Vitals: Ht 5\' 9"  (1.753 m)   Wt 248 lb (112.5 kg)   BMI 36.62 kg/m   Body mass index is 36.62 kg/m.  Advanced Directives 03/21/2019 10/07/2018 08/12/2018 08/07/2018 03/13/2018 02/14/2018 03/02/2017  Does Patient Have a Medical Advance Directive? Yes Yes Yes Yes Yes Yes Yes  Type of Paramedic of [place];Living will Living will Healthcare Power of [place];Living will [place];Living will [place];Living will [place];Living will  Does patient want to make changes to medical advance directive? No - Patient declined No - Patient declined - - No - Patient declined - No - Patient declined  Copy of [place] in Chart? Yes - validated most recent copy scanned in chart (See row information) - - - Yes - validated most recent copy scanned in chart (See row information) No - copy requested No - copy requested  Would patient like information on creating a medical advance directive? - - - - - - -    Tobacco Social History   Tobacco Use  Smoking Status Never Smoker  Smokeless Tobacco Never Used     Counseling given: Not Answered   Clinical Intake:  Pre-visit preparation completed: Yes        Diabetes: Yes(Followed by pcp)  How often do you need to have someone help you when you read instructions, pamphlets, or other written materials from your doctor or pharmacy?: 1 - Never  Interpreter Needed?: No     Past Medical History:  Diagnosis  Date  . Anemia   . Arthritis   . Atrial fibrillation ([place])   . Breast cancer ([place])    s/p lumpectomy 1992.  s/p chemo and xrt left breast  . Diabetes mellitus ([place])   . Edema    feet/legs  . Gastric ulcer   . GERD (gastroesophageal reflux disease)   . HOH (hard of hearing)    aides  . Hypercholesterolemia   . Hypertension   . Pulmonary emboli [facility])    Past Surgical History:  Procedure Laterality Date  . BREAST LUMPECTOMY  03/06/1990   left breast  . CATARACT EXTRACTION W/PHACO Right 12/26/2016   Procedure: CATARACT EXTRACTION PHACO AND INTRAOCULAR LENS PLACEMENT (IOC)-RIGHT DIABETIC;  Surgeon: Birder Robson, MD;  Location: [facility] ORS;  Service: Ophthalmology;  Laterality: Right;  [place] 00:47 AP% 24.5 CDE 11.62 Fluid pack lot # [id number]  . CATARACT EXTRACTION W/PHACO Left 01/23/2017   Procedure: CATARACT EXTRACTION PHACO AND INTRAOCULAR LENS PLACEMENT (IOC);  Surgeon: Birder Robson, MD;  Location: [facility] ORS;  Service: Ophthalmology;  Laterality: Left;  [place] 00:50 AP% 16.1 CDE 8.18 Fluid pack lot [id number]   Family History  Problem Relation Age of Onset  . Heart disease Father   . Heart disease Brother        s/p CABG  . Colon cancer Neg Hx   . Breast cancer Neg Hx    Social History   Socioeconomic History  . Marital status: Widowed    Spouse name: Not on file  .  Number of children: Not on file  . Years of education: Not on file  . Highest education level: Not on file  Occupational History  . Not on file  Tobacco Use  . Smoking status: Never Smoker  . Smokeless tobacco: Never Used  Substance and Sexual Activity  . Alcohol use: No    Alcohol/week: 0.0 standard drinks  . Drug use: No  . Sexual activity: Never  Other Topics Concern  . Not on file  Social History Narrative  . Not on file   Social Determinants of Health   Financial Resource Strain: Low Risk   . Difficulty of Paying Living Expenses: Not hard at all  Food Insecurity: No Food Insecurity  .  Worried About Charity fundraiser in the Last Year: Never true  . Ran Out of Food in the Last Year: Never true  Transportation Needs: No Transportation Needs  . Lack of Transportation (Medical): No  . Lack of Transportation (Non-Medical): No  Physical Activity:   . Days of Exercise per Week: Not on file  . Minutes of Exercise per Session: Not on file  Stress: No Stress Concern Present  . Feeling of Stress : Not at all  Social Connections: Unknown  . Frequency of Communication with Friends and Family: More than three times a week  . Frequency of Social Gatherings with Friends and Family: Not on file  . Attends Religious Services: Not on file  . Active Member of Clubs or Organizations: Not on file  . Attends Archivist Meetings: Not on file  . Marital Status: Not on file    Outpatient Encounter Medications as of 03/21/2019  Medication Sig  . amLODipine (NORVASC) 2.5 MG tablet Take 1 tablet (2.5 mg total) by mouth daily.  . CONTOUR TEST test strip USE TO TEST BLOOD SUGAR ONCE EVERY WEEK  . diphenhydrAMINE (BENADRYL) 25 mg capsule Take 25 mg at bedtime as needed by mouth for sleep.   . ferrous sulfate (FEROSUL) 325 (65 FE) MG tablet Take 1 tablet (325 mg total) by mouth daily.  Marland Kitchen losartan (COZAAR) 100 MG tablet Take 1 tablet (100 mg total) by mouth daily.  . Multiple Vitamin (MULTIVITAMIN WITH MINERALS) TABS tablet Take 1 tablet daily by mouth.  . pantoprazole (PROTONIX) 40 MG tablet TAKE 1 TABLET(40 MG) BY MOUTH DAILY  . simvastatin (ZOCOR) 10 MG tablet TAKE 1 TABLET BY MOUTH EVERY DAY  . warfarin (COUMADIN) 2.5 MG tablet Takes 2 tablets (5 mg) on Tuesdays and Thursdays and Saturday, and 1 tablet (2.5 mg)  all other days of the week   No facility-administered encounter medications on file as of 03/21/2019.    Activities of Daily Living In your present state of health, do you have any difficulty performing the following activities: 03/21/2019  Hearing? Y  Comment Hearing aids    Vision? N  Difficulty concentrating or making decisions? N  Walking or climbing stairs? Y  Comment Unsteady gait  Dressing or bathing? N  Doing errands, shopping? N  Preparing Food and eating ? N  Using the Toilet? N  In the past six months, have you accidently leaked urine? N  Do you have problems with loss of bowel control? N  Managing your Medications? N  Managing your Finances? N  Housekeeping or managing your Housekeeping? N  Some recent data might be hidden    Patient Care Team: Einar Pheasant, MD as PCP - General (Internal Medicine)    Assessment:   This is a  routine wellness examination for Rever Bellizzi.  Nurse connected with patient 03/21/19 at  9:30 AM EST by a telephone enabled telemedicine application and verified that I am speaking with the correct person using two identifiers. Patient stated full name and DOB. Patient gave permission to continue with virtual visit. Patient's location was at home and Nurse's location was at [place] office.   Patient is alert and oriented x3. Patient denies difficulty focusing or concentrating. Patient likes to watch television for brain stimulation.   Health Maintenance Due: -Tdap vaccine- discussed; to be completed with doctor in visit or local pharmacy.   -Eye Exam- next appointment scheduled 04/10/19 -Hgb A1c- 10/07/18 (5.4) See completed HM at the end of note.   Eye: Visual acuity not assessed. Virtual visit. Followed by their ophthalmologist. Retinopathy- none reported.  Dental: Dentures- yes  Hearing: Hearing aids- yes  Safety:  Patient feels safe at home- yes Patient does have smoke detectors at home- yes Patient does wear sunscreen or protective clothing when in direct sunlight - yes Patient does wear seat belt when in a moving vehicle - yes Patient drives- yes Adequate lighting in walkways free from debris- yes Grab bars and handrails used as appropriate- yes Ambulates with an assistive device- yes; cane Cell phone  on person when ambulating outside of the home- yes  Social: Alcohol intake - no      Smoking history- never   Smokers in home? none Illicit drug use? none  Medication: Taking as directed and without issues.  Pill box in use -yes  Self managed - yes  Covid-19: Precautions and sickness symptoms discussed. Wears mask, social distancing, hand hygiene as appropriate.   Activities of Daily Living Patient denies needing assistance with: household chores, feeding themselves, getting from bed to chair, getting to the toilet, bathing/showering, dressing. Assisted by daughter as needed for managing money and preparing meals.   Discussed the importance of a healthy diet, water intake and the benefits of aerobic exercise.   Physical activity- walking around the home, no routine.  Diet:  Regular Water: good intake Caffeine: 2 cups of coffee  Other Providers Patient Care Team: Einar Pheasant, MD as PCP - General (Internal Medicine)  Exercise Activities and Dietary recommendations Current Exercise Habits: Home exercise routine, Type of exercise: walking, Intensity: Mild  Goals    . Follow up with Primary Care Provider     As needed       Fall Risk Fall Risk  03/21/2019 03/11/2019 08/08/2018 03/13/2018 03/02/2017  Falls in the past year? 0 0 1 0 No  Number falls in past yr: - 0 0 - -  Injury with Fall? - - 1 - -  Follow up Falls evaluation completed - Falls evaluation completed - -   Timed Get Up and Go performed: no, virtual visit  Depression Screen PHQ 2/9 Scores 03/21/2019 03/11/2019 08/08/2018 03/13/2018  PHQ - 2 Score 0 0 0 0  PHQ- 9 Score - - 0 -     Cognitive Function MMSE - Mini Mental State Exam 02/11/2016  Not completed: Refused     6CIT Screen 03/21/2019 03/13/2018 03/02/2017 02/11/2016  What Year? 0 points 0 points 0 points 0 points  What month? 0 points 0 points 0 points 0 points  What time? 0 points 0 points 0 points 0 points  Count back from 20 0 points 0 points 0 points  (No Data)  Months in reverse 0 points 4 points 0 points (No Data)  Repeat phrase 2 points -  0 points 2 points  Total Score 2 - 0 -    Immunization History  Administered Date(s) Administered  . Fluad Quad(high Dose 65+) 12/11/2018  . Influenza Split 11/19/2011, 12/09/2012, 12/27/2017  . Influenza, High Dose Seasonal PF 12/02/2015, 11/07/2016, 11/14/2017  . Influenza,inj,Quad PF,6+ Mos 12/03/2013, 11/12/2014  . Influenza-Unspecified 12/12/2011, 12/11/2012, 12/03/2013, 11/12/2014, 12/02/2015  . Pneumococcal Conjugate-13 01/14/2014  . Pneumococcal Polysaccharide-23 02/11/2016  . Zoster Recombinat (Shingrix) 09/15/2017, 11/15/2017   Screening Tests Health Maintenance  Topic Date Due  . TETANUS/TDAP  03/21/1959  . OPHTHALMOLOGY EXAM  09/13/2018  . HEMOGLOBIN A1C  04/09/2019  . MAMMOGRAM  12/05/2019  . FOOT EXAM  12/11/2019  . INFLUENZA VACCINE  Completed  . DEXA SCAN  Completed  . PNA vac Low Risk Adult  Completed      Plan:   Keep all routine maintenance appointments.   Cpe 05/20/19 @ 230  Medicare Attestation I have personally reviewed: The patient's medical and social history Their use of alcohol, tobacco or illicit drugs Their current medications and supplements The patient's functional ability including ADLs,fall risks, home safety risks, cognitive, and hearing and visual impairment Diet and physical activities Evidence for depression   I have reviewed and discussed with patient certain preventive protocols, quality metrics, and best practice recommendations.      Varney Biles, LPN  [id number]   Reviewed above information.  Agree with assessment and plan.    Dr Nicki Reaper

## 2019-03-21 NOTE — Patient Instructions (Addendum)
  [name] , Thank you for taking time to come for your Medicare Wellness Visit. I appreciate your ongoing commitment to your health goals. Please review the following plan we discussed and let me know if I can assist you in the future.   These are the goals we discussed: Goals    . Follow up with Primary Care Provider     As needed       This is a list of the screening recommended for you and due dates:  Health Maintenance  Topic Date Due  . Tetanus Vaccine  03/21/1959  . Eye exam for diabetics  09/13/2018  . Hemoglobin A1C  04/09/2019  . Mammogram  12/05/2019  . Complete foot exam   12/11/2019  . Flu Shot  Completed  . DEXA scan (bone density measurement)  Completed  . Pneumonia vaccines  Completed

## 2019-03-25 ENCOUNTER — Encounter: Payer: Self-pay | Admitting: Pain Medicine

## 2019-03-25 NOTE — Progress Notes (Signed)
Pain relief after procedure (treated area only): (Questions asked to patient) 1. Starting about 15 minutes after the procedure, and "while the area was still numb" (from the local anesthetics), were you having any of your usual pain "in that area" (the treated area)?  (NOTE: NOT including the discomfort from the needle sticks.) First 1 hour: 75 % better. First 4-6 hours: 75 % better.   3. How much better is your pain now, when compared to before the procedure? Current benefit: 75 % better. 4. Can you move better now? Improvement in ROM (Range of Motion): Yes. 5. Can you do more now? Improvement in function: Yes. 4. Did you have any problems with the procedure? Side-effects/Complications: No. 

## 2019-03-25 NOTE — Progress Notes (Signed)
Unsuccessful attempt to contact patient for Virtual Visit (Pain Management Telehealth)   Patient provided contact information:  [phone] (home); [phone] (mobile); (Preferred) [phone] [email or web address]   Pre-screening:  Our staff was successful in contacting Anita Walker using the above provided information.   I unsuccessfully attempted to make contact with Anita Walker on 03/26/2019 via telephone. I was unable to complete the virtual encounter due to call going directly to voicemail. I was able to leave a message where I clearly identify myself as Gaspar Cola, MD and I left a message to call us back to reschedule the call.  Pharmacotherapy Assessment  Analgesic: None Highest recorded MME/day: 25 mg/day MME/day: 0 mg/day   Post-Procedure Evaluation  Procedure: Diagnostic right-sided lumbar facet block #1 under fluoroscopic guidance and IV sedation Pre-procedure pain level:  2/10  Post-procedure: 1/10 (100% relief)  Sedation: Sedation provided.  Dewayne Shorter, RN  03/25/2019  9:35 AM  Signed Pain relief after procedure (treated area only): (Questions asked to patient) 1. Starting about 15 minutes after the procedure, and "while the area was still numb" (from the local anesthetics), were you having any of your usual pain "in that area" (the treated area)?  (NOTE: NOT including the discomfort from the needle sticks.) First 1 hour: 75 % better. First 4-6 hours: 75 % better.   3. How much better is your pain now, when compared to before the procedure? Current benefit: 75 % better. 4. Can you move better now? Improvement in ROM (Range of Motion): Yes. 5. Can you do more now? Improvement in function: Yes. 4. Did you have any problems with the procedure? Side-effects/Complications: No.   Follow-up plan:   Reschedule Visit.     Interventional treatment options: Planned, scheduled, and/or pending:   Diagnostic bilateral lumbar facet block #1 under  fluoroscopic guidance and IV sedation (today)   Under consideration:  NOTE: COUMADIN ANTICOAGULATION (Stop: 5 days  Restart: 2 hrs)  Diagnostic bilateral vs right-sided L4-5 and L5-S1 lumbar facet block #1 under fluoroscopic guidance and IV sedation   Therapeutic/palliative (PRN):   None at this time    Recent Visits Date Type Provider Dept  03/11/19 Procedure visit Milinda Pointer, MD [facility]  02/18/19 Telemedicine Milinda Pointer, MD [facility]  Showing recent visits within past 90 days and meeting all other requirements   Today's Visits Date Type Provider Dept  03/26/19 Telemedicine Milinda Pointer, MD [facility]  Showing today's visits and meeting all other requirements   Future Appointments No visits were found meeting these conditions.  Showing future appointments within next 90 days and meeting all other requirements    Note by: Gaspar Cola, MD Date: 03/26/2019; Time: 3:56 PM

## 2019-03-26 ENCOUNTER — Other Ambulatory Visit: Payer: Self-pay

## 2019-03-26 ENCOUNTER — Ambulatory Visit: Payer: Medicare Other | Attending: Pain Medicine | Admitting: Pain Medicine

## 2019-03-26 ENCOUNTER — Other Ambulatory Visit (INDEPENDENT_AMBULATORY_CARE_PROVIDER_SITE_OTHER): Payer: Medicare Other

## 2019-03-26 DIAGNOSIS — Z7901 Long term (current) use of anticoagulants: Secondary | ICD-10-CM | POA: Diagnosis not present

## 2019-03-26 DIAGNOSIS — Z5320 Procedure and treatment not carried out because of patient's decision for unspecified reasons: Secondary | ICD-10-CM

## 2019-03-27 ENCOUNTER — Encounter: Payer: Self-pay | Admitting: Pain Medicine

## 2019-03-27 LAB — PROTIME-INR
INR: 1.7 — ABNORMAL HIGH
Prothrombin Time: 17 s — ABNORMAL HIGH (ref 9.0–11.5)

## 2019-03-27 NOTE — Progress Notes (Signed)
Patient: Anita Walker  Service Category: E/M  Provider: Gaspar Cola, MD  DOB: 1940/04/27  DOS: 03/31/2019  Location: Office  MRN: [id number]  Setting: Ambulatory outpatient  Referring Provider: Einar Pheasant, MD  Type: Established Patient  Specialty: Interventional Pain Management  PCP: Anita Pheasant, MD  Location: Remote location  Delivery: TeleHealth     Virtual Encounter - Pain Management PROVIDER NOTE: Information contained herein reflects review and annotations entered in association with encounter. Interpretation of such information and data should be left to medically-trained personnel. Information provided to patient can be located elsewhere in the medical record under "Patient Instructions". Document created using STT-dictation technology, any transcriptional errors that may result from process are unintentional.    Contact & Pharmacy Preferred: [phone] Home: [phone] (home) Mobile: [phone] (mobile) E-mail: [email or web address]  [place], [place] AT [facility] [address] [place] [address] Phone: [phone] Fax: [phone]   Pre-screening  Anita Walker offered "in-person" vs "virtual" encounter. She indicated preferring virtual for this encounter.   Reason COVID-19*  Social distancing based on CDC and AMA recommendations.   I contacted Anita Walker on 03/31/2019 via telephone.      I clearly identified myself as Anita Cola, MD. I verified that I was speaking with the correct person using two identifiers (Name: Anita Walker, and date of birth: [birth year]).  Consent I sought verbal advanced consent from Anita Walker for virtual visit interactions. I informed Anita Walker of possible security and privacy concerns, risks, and limitations associated with providing "not-in-person" medical evaluation and management services. I also informed Anita Walker of the  availability of "in-person" appointments. Finally, I informed her that there would be a charge for the virtual visit and that she could be  personally, fully or partially, financially responsible for it. Anita Walker expressed understanding and agreed to proceed.   Historic Elements   [name] is a 79 y.o. year old, female patient evaluated today after her last encounter by our practice on 03/12/2019. Anita Walker  has a past medical history of Anemia, Arthritis, Atrial fibrillation ([place]), Breast cancer ([place]), Diabetes mellitus ([place]), Edema, Gastric ulcer, GERD (gastroesophageal reflux disease), HOH (hard of hearing), Hypercholesterolemia, Hypertension, and Pulmonary emboli ([place]). She also  has a past surgical history that includes Cataract extraction w/PHACO (Right, 12/26/2016); Cataract extraction w/PHACO (Left, 01/23/2017); and Breast lumpectomy (03/06/1990). Anita Walker has a current medication list which includes the following prescription(s): amlodipine, contour test, diphenhydramine, ferrous sulfate, losartan, multivitamin with minerals, pantoprazole, simvastatin, and warfarin. She  reports that she has never smoked. She has never used smokeless tobacco. She reports that she does not drink alcohol or use drugs. Anita Walker is allergic to penicillins and penicillin v potassium.   HPI  Today, she is being contacted for a post-procedure assessment.  According to the patient she did rather well after the diagnostic injection in fact, she still doing well since the injection done on 03/11/2019.  Today I explained to her how we need to repeat the injection if and when the pain comes back, but if it keeps coming back then we may need to consider the possibility of radiofrequency ablation.  I also explained to her what that was, but I am pretty sure when the time comes I will have to explain to her again.  In any case, the patient indicates that she is doing well at this time and  she does not want to have  anything done therefore I will enter a as needed order to have the procedure repeated when she calls.  We will also need to stop her Coumadin for 5 days prior to doing the procedure.  Post-Procedure Evaluation  Procedure: Diagnostic right-sided lumbar facet block #1under fluoroscopic guidance and IV sedation Pre-procedure pain level:2/10             Post-procedure:1/10(100% relief)  Sedation: Sedation provided.  Landis Martins, RN  03/27/2019  1:47 PM  Sign when Signing Visit Pain relief after procedure (treated area only): (Questions asked to patient) 1. Starting about 15 minutes after the procedure, and "while the area was still numb" (from the local anesthetics), were you having any of your usual pain "in that area" (the treated area)?  (NOTE: NOT including the discomfort from the needle sticks.) First 1 hour: 75 % better. First 4-6 hours: 75 % better. 2. How long did the numbness from the local anesthetics last? (More than 6 hours?) Duration: 6 hours.  3. How much better is your pain now, when compared to before the procedure? Current benefit: 75 % better. 4. Can you move better now? Improvement in ROM (Range of Motion): Yes. 5. Can you do more now? Improvement in function: Yes. 4. Did you have any problems with the procedure? Side-effects/Complications: No.  Current benefits: Defined as benefit that persist at this time.   Analgesia:  >75% relief Function: Anita Walker reports improvement in function ROM: Anita Walker reports improvement in ROM  Pharmacotherapy Assessment  Analgesic: None Highest recorded MME/day: 25 mg/day MME/day: 0 mg/day   Monitoring: Pharmacotherapy: No side-effects or adverse reactions reported. [place] PMP: PDMP reviewed during this encounter.       Compliance: No problems identified. Effectiveness: Clinically acceptable. Plan: Refer to "POC".  UDS: No results found for: SUMMARY Laboratory Chemistry Profile (12 mo)  Renal: 12/11/2018: BUN 16;  Creatinine, Ser 0.83  Lab Results  Component Value Date   GFR 66.02 12/11/2018   GFRAA >60 08/07/2018   GFRNONAA >60 08/07/2018   Hepatic: 10/07/2018: Albumin 4.2 Lab Results  Component Value Date   AST 16 10/07/2018   ALT 13 10/07/2018   Other: No results found for requested labs within last 8760 hours.  Note: Above Lab results reviewed.  Imaging  Fluoro (C-Arm) (<60 min) (No Report) Fluoro was used, but no Radiologist interpretation will be provided.  Please refer to "NOTES" tab for provider progress note.  Assessment  The primary encounter diagnosis was Lumbar facet syndrome (Bilateral) (R>L). Diagnoses of Lumbar facet arthropathy (Multilevel), Chronic low back pain (Primary Area of Pain) (Bilateral) (R>L) w/o sciatica, and Chronic anticoagulation (Coumadin) were also pertinent to this visit.  Plan of Care  Problem-specific:  No problem-specific Assessment & Plan notes found for this encounter.  I am having Anita Walker maintain her diphenhydrAMINE, multivitamin with minerals, losartan, warfarin, amLODipine, pantoprazole, simvastatin, ferrous sulfate, and Contour Test.  Pharmacotherapy (Medications Ordered): No orders of the defined types were placed in this encounter.  Orders:  Orders Placed This Encounter  Procedures  . LUMBAR FACET(MEDIAL BRANCH NERVE BLOCK) MBNB    Scheduling timeframe: (PRN procedure) Anita Walker will call when needed. Clinical indication: Axial low back pain. 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  Sedation: Usually done with sedation. (May be done without sedation if so desired by patient.) Requirements: NPO x 8 hrs.; Driver; Stop blood thinners. Interval: No sooner than two weeks for diagnostic or therapeutic. No sooner than every other  month for palliative.    Standing Status:   Standing    Number of Occurrences:   5    Standing Expiration Date:   03/30/2020    Scheduling Instructions:     Procedure: Lumbar facet block (AKA.: Lumbosacral medial  branch nerve block)     Level: L3-4, L4-5, & L5-S1 Facets (L2, L3, L4, L5, & S1 Medial Branch Nerves)     Laterality: Bilateral    Order Specific Question:   Where will this procedure be performed?    Answer:   [facility]  . Blood Thinner Instructions to Nursing    If the patient requires a Lovenox-bridge therapy, make sure arrangements are made to institute it with the assistance of the PCP.    Standing Status:   Standing    Number of Occurrences:   36    Standing Expiration Date:   09/27/2020    Scheduling Instructions:     Always stop the Coumadin (Warfarin) X 5 days prior to procedure or surgery.   Follow-up plan:   Return for PRN Procedure(s): (B) vs (R) L-FCT BLK #2, (w/ Sedation), (Blood-thinner Protocol).      Interventional treatment options: Planned, scheduled, and/or pending:   None at this time.   Under consideration:  NOTE: COUMADIN ANTICOAGULATION (Stop: 5 days  Restart: 2 hrs)  Diagnostic right-sided vs bilateral lumbar facet block #2    Therapeutic/palliative (PRN):   Diagnostic right lumbar facet block #2     Recent Visits Date Type Provider Dept  03/11/19 Procedure visit Milinda Pointer, MD [facility]  02/18/19 Telemedicine Milinda Pointer, MD [facility]  Showing recent visits within past 90 days and meeting all other requirements   Today's Visits Date Type Provider Dept  03/31/19 Telemedicine Milinda Pointer, MD [facility]  Showing today's visits and meeting all other requirements   Future Appointments No visits were found meeting these conditions.  Showing future appointments within next 90 days and meeting all other requirements   I discussed the assessment and treatment plan with the patient. The patient was provided an opportunity to ask questions and all were answered. The patient agreed with the plan and demonstrated an understanding of the instructions.  Patient advised to call back or seek an  in-person evaluation if the symptoms or condition worsens.  Duration of encounter: 17 minutes.  Note by: Anita Cola, MD Date: 03/31/2019; Time: 2:14 PM

## 2019-03-27 NOTE — Progress Notes (Signed)
Pain relief after procedure (treated area only): (Questions asked to patient) 1. Starting about 15 minutes after the procedure, and "while the area was still numb" (from the local anesthetics), were you having any of your usual pain "in that area" (the treated area)?  (NOTE: NOT including the discomfort from the needle sticks.) First 1 hour: 75 % better. First 4-6 hours:75 % better. 2. How long did the numbness from the local anesthetics last? (More than 6 hours?) Duration: 24hours.  3. How much better is your pain now, when compared to before the procedure? Current benefit:75 % better. 4. Can you move better now? Improvement in ROM (Range of Motion): Yes. 5. Can you do more now? Improvement in function: Yes. 4. Did you have any problems with the procedure? Side-effects/Complications: No.

## 2019-03-31 ENCOUNTER — Telehealth: Payer: Self-pay

## 2019-03-31 ENCOUNTER — Other Ambulatory Visit: Payer: Self-pay

## 2019-03-31 ENCOUNTER — Ambulatory Visit: Payer: Medicare Other | Attending: Pain Medicine | Admitting: Pain Medicine

## 2019-03-31 DIAGNOSIS — M47816 Spondylosis without myelopathy or radiculopathy, lumbar region: Secondary | ICD-10-CM | POA: Diagnosis not present

## 2019-03-31 DIAGNOSIS — G8929 Other chronic pain: Secondary | ICD-10-CM | POA: Diagnosis not present

## 2019-03-31 DIAGNOSIS — M545 Low back pain: Secondary | ICD-10-CM

## 2019-03-31 DIAGNOSIS — Z7901 Long term (current) use of anticoagulants: Secondary | ICD-10-CM | POA: Diagnosis not present

## 2019-03-31 NOTE — Telephone Encounter (Signed)
Returned patients call to schedule her colonoscopy.  Unable to contact her due to voicemail currently full.  Thanks [place], [place]

## 2019-03-31 NOTE — Patient Instructions (Signed)

## 2019-04-01 ENCOUNTER — Telehealth: Payer: Self-pay

## 2019-04-01 ENCOUNTER — Other Ambulatory Visit: Payer: Self-pay

## 2019-04-01 DIAGNOSIS — Z1211 Encounter for screening for malignant neoplasm of colon: Secondary | ICD-10-CM

## 2019-04-01 NOTE — Telephone Encounter (Signed)
Gastroenterology Pre-Procedure Review  Request Date: 04/28/19 Requesting Physician: Dr. Marius Ditch  PATIENT REVIEW QUESTIONS: The patient responded to the following health history questions as indicated:    1. Are you having any GI issues? no 2. Do you have a personal history of Polyps? no 3. Do you have a family history of Colon Cancer or Polyps? no 4. Diabetes Mellitus?  5. Joint replacements in the past 12 months?no 6. Major health problems in the past 3 months?no 7. Any artificial heart valves, MVP, or defibrillator?no    MEDICATIONS & ALLERGIES:    Patient reports the following regarding taking any anticoagulation/antiplatelet therapy:   Plavix, Coumadin, Eliquis, Xarelto, Lovenox, Pradaxa, Brilinta, or Effient? Warfarin DrMarland Kitchen Nicki Reaper Aspirin? no  Patient confirms/reports the following medications:  Current Outpatient Medications  Medication Sig Dispense Refill  . amLODipine (NORVASC) 2.5 MG tablet Take 1 tablet (2.5 mg total) by mouth daily. 30 tablet 2  . CONTOUR TEST test strip USE TO TEST BLOOD SUGAR ONCE EVERY WEEK 25 strip 1  . diphenhydrAMINE (BENADRYL) 25 mg capsule Take 25 mg at bedtime as needed by mouth for sleep.     . ferrous sulfate (FEROSUL) 325 (65 FE) MG tablet Take 1 tablet (325 mg total) by mouth daily. 90 tablet 2  . losartan (COZAAR) 100 MG tablet Take 1 tablet (100 mg total) by mouth daily. 90 tablet 3  . Multiple Vitamin (MULTIVITAMIN WITH MINERALS) TABS tablet Take 1 tablet daily by mouth.    . pantoprazole (PROTONIX) 40 MG tablet TAKE 1 TABLET(40 MG) BY MOUTH DAILY 90 tablet 1  . simvastatin (ZOCOR) 10 MG tablet TAKE 1 TABLET BY MOUTH EVERY DAY 90 tablet 1  . warfarin (COUMADIN) 2.5 MG tablet Takes 2 tablets (5 mg) on Tuesdays and Thursdays and Saturday, and 1 tablet (2.5 mg)  all other days of the week 142 tablet 2   No current facility-administered medications for this visit.    Patient confirms/reports the following allergies:  Allergies  Allergen  Reactions  . Penicillins Other (See Comments)    Unknown- pt states been a long time ago  Has patient had a PCN reaction causing immediate rash, facial/tongue/throat swelling, SOB or lightheadedness with hypotension: Unknown Has patient had a PCN reaction causing severe rash involving mucus membranes or skin necrosis: Unknown Has patient had a PCN reaction that required hospitalization: Unknown Has patient had a PCN reaction occurring within the last 10 years: Unknown If all of the above answers are "NO", then may proceed with Cephalosporin use.  Marland Kitchen Penicillin V Potassium Nausea And Vomiting    No orders of the defined types were placed in this encounter.   AUTHORIZATION INFORMATION Primary Insurance: 1D#: Group #:  Secondary Insurance: 1D#: Group #:  SCHEDULE INFORMATION: Date:  Time: Location:

## 2019-04-03 ENCOUNTER — Other Ambulatory Visit: Payer: Self-pay

## 2019-04-03 ENCOUNTER — Other Ambulatory Visit (INDEPENDENT_AMBULATORY_CARE_PROVIDER_SITE_OTHER): Payer: Medicare Other

## 2019-04-03 DIAGNOSIS — Z7901 Long term (current) use of anticoagulants: Secondary | ICD-10-CM | POA: Diagnosis not present

## 2019-04-03 LAB — PROTIME-INR
INR: 2 ratio — ABNORMAL HIGH (ref 0.8–1.0)
Prothrombin Time: 23 s — ABNORMAL HIGH (ref 9.6–13.1)

## 2019-04-04 ENCOUNTER — Telehealth: Payer: Self-pay | Admitting: Lab

## 2019-04-04 NOTE — Telephone Encounter (Signed)
Called Pt No answer, left VM to call office.

## 2019-04-07 ENCOUNTER — Other Ambulatory Visit: Payer: Self-pay

## 2019-04-07 ENCOUNTER — Encounter: Payer: Self-pay | Admitting: Podiatry

## 2019-04-07 ENCOUNTER — Ambulatory Visit (INDEPENDENT_AMBULATORY_CARE_PROVIDER_SITE_OTHER): Payer: Medicare Other | Admitting: Podiatry

## 2019-04-07 DIAGNOSIS — E1142 Type 2 diabetes mellitus with diabetic polyneuropathy: Secondary | ICD-10-CM

## 2019-04-07 DIAGNOSIS — M79675 Pain in left toe(s): Secondary | ICD-10-CM | POA: Diagnosis not present

## 2019-04-07 DIAGNOSIS — B351 Tinea unguium: Secondary | ICD-10-CM

## 2019-04-07 DIAGNOSIS — M79674 Pain in right toe(s): Secondary | ICD-10-CM

## 2019-04-07 DIAGNOSIS — D689 Coagulation defect, unspecified: Secondary | ICD-10-CM

## 2019-04-07 NOTE — Progress Notes (Signed)
Complaint:  Visit Type: Patient returns to my office for continued preventative foot care services. Complaint: Patient states" my nails have grown long and thick and become painful to walk and wear shoes" Patient has been diagnosed with DM with neuropathy. The patient presents for preventative foot care services. No changes to ROS.  Patient is taking coumadin.  Podiatric Exam: Vascular: dorsalis pedis  are palpable bilateral. Posterior tibial pulses are non palpable due to ankle/leg swelling. Capillary return is immediate. Temperature gradient is WNL. Skin turgor WNL  Sensorium: Diminished  Semmes Weinstein monofilament test. Normal tactile sensation bilaterally. Nail Exam: Pt has thick disfigured discolored nails with subungual debris noted bilateral entire nail hallux through fifth toenails.  Pincer hallux nails. Ulcer Exam: There is no evidence of ulcer or pre-ulcerative changes or infection. Orthopedic Exam: Muscle tone and strength are WNL. No limitations in general ROM. No crepitus or effusions noted. Foot type and digits show no abnormalities. Bony prominences are unremarkable. Skin: No Porokeratosis. No infection or ulcers  Diagnosis:  Onychomycosis, , Pain in right toe, pain in left toes  Treatment & Plan Procedures and Treatment: Consent by patient was obtained for treatment procedures. The patient understood the discussion of treatment and procedures well. All questions were answered thoroughly reviewed. Debridement of mycotic and hypertrophic toenails, 1 through 5 bilateral and clearing of subungual debris. No ulceration, no infection noted.  Return Visit-Office Procedure: Patient instructed to return to the office for a follow up visit 10 weeks  for continued evaluation and treatment.    Gardiner Barefoot DPM

## 2019-04-08 ENCOUNTER — Telehealth: Payer: Self-pay | Admitting: Internal Medicine

## 2019-04-08 NOTE — Telephone Encounter (Signed)
Anita Walker with [facility] was calling about a blood thinner request that was sent over last week. Please call her back @ [phone]

## 2019-04-09 ENCOUNTER — Other Ambulatory Visit: Payer: Self-pay | Admitting: Internal Medicine

## 2019-04-09 NOTE — Progress Notes (Signed)
Opened in error

## 2019-04-09 NOTE — Telephone Encounter (Signed)
Form is in quick sign

## 2019-04-10 ENCOUNTER — Ambulatory Visit (INDEPENDENT_AMBULATORY_CARE_PROVIDER_SITE_OTHER): Payer: Medicare Other | Admitting: Physician Assistant

## 2019-04-10 ENCOUNTER — Other Ambulatory Visit: Payer: Self-pay

## 2019-04-10 DIAGNOSIS — N3941 Urge incontinence: Secondary | ICD-10-CM | POA: Diagnosis not present

## 2019-04-10 NOTE — Progress Notes (Signed)
PTNS  Session # monthly maintenance  Health & Social Factors: no change Caffeine: 2 Alcohol: 0 Daytime voids #per day: 6-7 Night-time voids #per night: 1-2 Urgency: mild Incontinence Episodes #per day: 0 Ankle used: right Treatment Setting: 9 Feeling/ Response: both Comments: patient tolerated well  Performed By: Debroah Loop, PA-C   Follow Up: Return in about 4 weeks (around 05/08/2019) for PTNS maintenance.

## 2019-04-11 ENCOUNTER — Telehealth: Payer: Self-pay | Admitting: Internal Medicine

## 2019-04-11 NOTE — Telephone Encounter (Signed)
See other message

## 2019-04-11 NOTE — Telephone Encounter (Signed)
Please notify Sharyn Lull that I will contact pt and discuss and we will forward information to them.  I just like to clarify with pt - understands risk of stopping ,etc.

## 2019-04-11 NOTE — Telephone Encounter (Signed)
Michelle from [facility] was returning a call about blood thinner clearance. Please advise.

## 2019-04-11 NOTE — Telephone Encounter (Signed)
Left message for Anita Walker to call back.

## 2019-04-14 ENCOUNTER — Other Ambulatory Visit (INDEPENDENT_AMBULATORY_CARE_PROVIDER_SITE_OTHER): Payer: Medicare Other

## 2019-04-14 ENCOUNTER — Other Ambulatory Visit: Payer: Self-pay

## 2019-04-14 DIAGNOSIS — Z7901 Long term (current) use of anticoagulants: Secondary | ICD-10-CM

## 2019-04-14 LAB — PROTIME-INR
INR: 2.2 ratio — ABNORMAL HIGH (ref 0.8–1.0)
Prothrombin Time: 24.7 s — ABNORMAL HIGH (ref 9.6–13.1)

## 2019-04-19 ENCOUNTER — Encounter: Payer: Self-pay | Admitting: Internal Medicine

## 2019-04-21 ENCOUNTER — Other Ambulatory Visit: Payer: Self-pay

## 2019-04-21 NOTE — Telephone Encounter (Signed)
Paper completed. Left message for Sharyn Lull at [facility]

## 2019-04-21 NOTE — Telephone Encounter (Signed)
Called daughter because it looks like this medication has not been filled since 10/2018 for #30 with 2 refills. Pt thought she was taking this medication but she thought her iron pill was her BP medication. Advised patient needed to monitor her BP and I would let her know if she should restart amlodipine after discussing with Dr Nicki Reaper. She has not taken this since around November.

## 2019-04-22 ENCOUNTER — Other Ambulatory Visit: Payer: Self-pay | Admitting: Internal Medicine

## 2019-04-22 DIAGNOSIS — I10 Essential (primary) hypertension: Secondary | ICD-10-CM

## 2019-04-22 DIAGNOSIS — E7800 Pure hypercholesterolemia, unspecified: Secondary | ICD-10-CM

## 2019-04-22 DIAGNOSIS — E1142 Type 2 diabetes mellitus with diabetic polyneuropathy: Secondary | ICD-10-CM

## 2019-04-22 DIAGNOSIS — Z7901 Long term (current) use of anticoagulants: Secondary | ICD-10-CM

## 2019-04-22 NOTE — Progress Notes (Signed)
Order placed for f/u labs.  

## 2019-04-22 NOTE — Telephone Encounter (Signed)
I spoke to pt regarding her blood pressure.  She has not been taking amlodipine.  She took blood pressure today - 129/81.  Will remain off for now.  Will spot check her blood pressure.  Will have daughter check her blood pressure.  Send in readings.  If increase, will restart medication.  Also, I spoke with her about stopping her coumadin prior to colonoscopy.  We discussed risks of stopping the medication including risks of clot, CVA, TIA, etc.  She understands risks and agrees to proceed with planned procedure.  Form completed.

## 2019-04-22 NOTE — Telephone Encounter (Signed)
Faxed paper to [facility]

## 2019-04-22 NOTE — Telephone Encounter (Signed)
See phone note for documentation about form for blood thinner.

## 2019-04-23 ENCOUNTER — Telehealth: Payer: Self-pay

## 2019-04-23 ENCOUNTER — Telehealth: Payer: Self-pay | Admitting: Gastroenterology

## 2019-04-23 NOTE — Telephone Encounter (Signed)
LVM for pt to make her aware that we will need to reschedule her colonoscopy that was scheduled for 04/28/19 due to blood thinner advice needed.  We received the request however, the number of days was not indicated for when she needed to stop.  Messaged Dr. Bary Leriche nurse to ask for clarification on the stop date, but she said Dr. Nicki Reaper was out of the office this afternoon.  I will call patient back to reschedule once we get a stop date for her Warfarin.  Thanks,  [place], [place]

## 2019-04-23 NOTE — Telephone Encounter (Signed)
Patients call has been returned.  I explained to her that we had to cancel her 04/28/19 colonoscopy due to clarification on blood thinner is needed.  I informed her that we will call her back to reschedule once clarification has been received from Dr. Bary Leriche office.  Pt advised that she does not need to have COVID test until we reschedule her procedure.  Thanks,  [place], [place]

## 2019-04-23 NOTE — Telephone Encounter (Signed)
Received message from [place] at [facility] stating that their providers will not determine how many days patient should be off of blood thinners prior to colonoscopy and after because that is not their providers specialties. Also noted that colonoscopy has been cancelled and will be rescheduled once a clear stop date for warfarin is determined. Patient was contacted by GI. Advised that you are not in the office this afternoon.

## 2019-04-23 NOTE — Telephone Encounter (Signed)
PATIENT IS RETURNING MICHELE'S CALL & DID NOT UNDERSTAND HER MESSAGE. SHE IS ASKING IF SHE IS STILL HAVING HER COLONOSCOPY ON 04-28-2019

## 2019-04-23 NOTE — Telephone Encounter (Signed)
Left message for Sharyn Lull at [facility] to confirm that patient was advised when to stop coumadin and paper was received.

## 2019-04-28 ENCOUNTER — Ambulatory Visit: Admission: RE | Admit: 2019-04-28 | Payer: Medicare Other | Source: Ambulatory Visit | Admitting: Gastroenterology

## 2019-04-28 ENCOUNTER — Encounter: Admission: RE | Payer: Self-pay | Source: Ambulatory Visit

## 2019-04-28 SURGERY — COLONOSCOPY WITH PROPOFOL
Anesthesia: General

## 2019-05-02 ENCOUNTER — Telehealth: Payer: Self-pay | Admitting: Internal Medicine

## 2019-05-02 MED ORDER — FERROUS SULFATE 325 (65 FE) MG PO TABS: 325.0000 mg | ORAL_TABLET | Freq: Every day | ORAL | 2 refills | Status: DC

## 2019-05-02 MED ORDER — SIMVASTATIN 10 MG PO TABS: ORAL_TABLET | ORAL | 1 refills | Status: DC

## 2019-05-02 MED ORDER — LOSARTAN POTASSIUM 100 MG PO TABS: 100.0000 mg | ORAL_TABLET | Freq: Every day | ORAL | 3 refills | Status: DC

## 2019-05-02 MED ORDER — PANTOPRAZOLE SODIUM 40 MG PO TBEC: DELAYED_RELEASE_TABLET | ORAL | 1 refills | Status: DC

## 2019-05-02 NOTE — Telephone Encounter (Signed)
Pt needs refill on simvastatin (ZOCOR) 10 MG tablet and pantoprazole (PROTONIX) 40 MG tablet and ferrous sulfate (FEROSUL) 325 (65 FE) MG tablet and losartan (COZAAR) 100 MG tablet

## 2019-05-03 NOTE — Telephone Encounter (Signed)
I have sent a message to Dr Marius Ditch to clarify.  Will complete form and fax back once clarified.

## 2019-05-06 ENCOUNTER — Other Ambulatory Visit (INDEPENDENT_AMBULATORY_CARE_PROVIDER_SITE_OTHER): Payer: Medicare Other

## 2019-05-06 ENCOUNTER — Other Ambulatory Visit: Payer: Self-pay

## 2019-05-06 DIAGNOSIS — Z7901 Long term (current) use of anticoagulants: Secondary | ICD-10-CM | POA: Diagnosis not present

## 2019-05-06 DIAGNOSIS — E7800 Pure hypercholesterolemia, unspecified: Secondary | ICD-10-CM

## 2019-05-06 DIAGNOSIS — I10 Essential (primary) hypertension: Secondary | ICD-10-CM | POA: Diagnosis not present

## 2019-05-06 DIAGNOSIS — E1142 Type 2 diabetes mellitus with diabetic polyneuropathy: Secondary | ICD-10-CM | POA: Diagnosis not present

## 2019-05-06 LAB — CBC WITH DIFFERENTIAL/PLATELET
Basophils Absolute: 0.1 10*3/uL (ref 0.0–0.1)
Basophils Relative: 0.9 % (ref 0.0–3.0)
Eosinophils Absolute: 0.2 10*3/uL (ref 0.0–0.7)
Eosinophils Relative: 2.5 % (ref 0.0–5.0)
HCT: 42.9 % (ref 36.0–46.0)
Hemoglobin: 14.3 g/dL (ref 12.0–15.0)
Lymphocytes Relative: 31.7 % (ref 12.0–46.0)
Lymphs Abs: 2.3 10*3/uL (ref 0.7–4.0)
MCHC: 33.2 g/dL (ref 30.0–36.0)
MCV: 91 fl (ref 78.0–100.0)
Monocytes Absolute: 1.1 10*3/uL — ABNORMAL HIGH (ref 0.1–1.0)
Monocytes Relative: 15.6 % — ABNORMAL HIGH (ref 3.0–12.0)
Neutro Abs: 3.6 10*3/uL (ref 1.4–7.7)
Neutrophils Relative %: 49.3 % (ref 43.0–77.0)
Platelets: 216 10*3/uL (ref 150.0–400.0)
RBC: 4.72 Mil/uL (ref 3.87–5.11)
RDW: 14.3 % (ref 11.5–15.5)
WBC: 7.4 10*3/uL (ref 4.0–10.5)

## 2019-05-06 LAB — BASIC METABOLIC PANEL
BUN: 15 mg/dL (ref 6–23)
CO2: 31 mEq/L (ref 19–32)
Calcium: 9.5 mg/dL (ref 8.4–10.5)
Chloride: 102 mEq/L (ref 96–112)
Creatinine, Ser: 0.87 mg/dL (ref 0.40–1.20)
GFR: 62.47 mL/min (ref >60.00)
Glucose, Bld: 122 mg/dL — ABNORMAL HIGH (ref 70–99)
Potassium: 4.5 mEq/L (ref 3.5–5.1)
Sodium: 137 mEq/L (ref 135–145)

## 2019-05-06 LAB — HEPATIC FUNCTION PANEL
ALT: 15 U/L (ref 0–35)
AST: 19 U/L (ref 0–37)
Albumin: 3.9 g/dL (ref 3.5–5.2)
Alkaline Phosphatase: 58 U/L (ref 39–117)
Bilirubin, Direct: 0.1 mg/dL (ref 0.0–0.3)
Total Bilirubin: 0.5 mg/dL (ref 0.2–1.2)
Total Protein: 6.6 g/dL (ref 6.0–8.3)

## 2019-05-06 LAB — TSH: TSH: 3.15 u[IU]/mL (ref 0.35–4.50)

## 2019-05-06 LAB — PROTIME-INR
INR: 2.2 ratio — ABNORMAL HIGH (ref 0.8–1.0)
Prothrombin Time: 24.7 s — ABNORMAL HIGH (ref 9.6–13.1)

## 2019-05-06 LAB — HEMOGLOBIN A1C: Hgb A1c MFr Bld: 5.9 % (ref 4.6–6.5)

## 2019-05-08 ENCOUNTER — Ambulatory Visit (INDEPENDENT_AMBULATORY_CARE_PROVIDER_SITE_OTHER): Payer: Medicare Other | Admitting: Physician Assistant

## 2019-05-08 ENCOUNTER — Other Ambulatory Visit: Payer: Self-pay

## 2019-05-08 DIAGNOSIS — N3941 Urge incontinence: Secondary | ICD-10-CM | POA: Diagnosis not present

## 2019-05-08 NOTE — Progress Notes (Signed)
PTNS  Session # monthly maintenance  Health & Social Factors: no change Caffeine: 2 Alcohol: 0 Daytime voids #per day: 6-7 Night-time voids #per night: 1-2 Urgency: mild Incontinence Episodes #per day: 1-2 Ankle used: right Treatment Setting: 10 Feeling/ Response: sensory Comments: patient tolerated well  Performed By: Debroah Loop, PA-C   Follow Up: Return in about 4 weeks (around 06/05/2019) for PTNS maintenance.

## 2019-05-14 DIAGNOSIS — Z859 Personal history of malignant neoplasm, unspecified: Secondary | ICD-10-CM | POA: Diagnosis not present

## 2019-05-14 DIAGNOSIS — L578 Other skin changes due to chronic exposure to nonionizing radiation: Secondary | ICD-10-CM | POA: Diagnosis not present

## 2019-05-14 DIAGNOSIS — Z85828 Personal history of other malignant neoplasm of skin: Secondary | ICD-10-CM | POA: Diagnosis not present

## 2019-05-14 DIAGNOSIS — Z86018 Personal history of other benign neoplasm: Secondary | ICD-10-CM | POA: Diagnosis not present

## 2019-05-14 DIAGNOSIS — Z872 Personal history of diseases of the skin and subcutaneous tissue: Secondary | ICD-10-CM | POA: Diagnosis not present

## 2019-05-14 DIAGNOSIS — L570 Actinic keratosis: Secondary | ICD-10-CM | POA: Diagnosis not present

## 2019-05-14 NOTE — Telephone Encounter (Signed)
Pt has appt with you on 3/16. Have you talked with her yet about stopping her blood thinners since consulting with Dr Marius Ditch? If not, can hold for appt on 3/16 to discuss then.

## 2019-05-14 NOTE — Telephone Encounter (Signed)
Can talk to her on 05/20/19

## 2019-05-20 ENCOUNTER — Ambulatory Visit (INDEPENDENT_AMBULATORY_CARE_PROVIDER_SITE_OTHER): Payer: Medicare Other | Admitting: Internal Medicine

## 2019-05-20 ENCOUNTER — Other Ambulatory Visit: Payer: Self-pay

## 2019-05-20 ENCOUNTER — Encounter: Payer: Self-pay | Admitting: Internal Medicine

## 2019-05-20 DIAGNOSIS — E119 Type 2 diabetes mellitus without complications: Secondary | ICD-10-CM | POA: Diagnosis not present

## 2019-05-20 DIAGNOSIS — R195 Other fecal abnormalities: Secondary | ICD-10-CM

## 2019-05-20 DIAGNOSIS — E7800 Pure hypercholesterolemia, unspecified: Secondary | ICD-10-CM

## 2019-05-20 DIAGNOSIS — I10 Essential (primary) hypertension: Secondary | ICD-10-CM | POA: Diagnosis not present

## 2019-05-20 DIAGNOSIS — E1142 Type 2 diabetes mellitus with diabetic polyneuropathy: Secondary | ICD-10-CM | POA: Diagnosis not present

## 2019-05-20 DIAGNOSIS — S22080D Wedge compression fracture of T11-T12 vertebra, subsequent encounter for fracture with routine healing: Secondary | ICD-10-CM

## 2019-05-20 NOTE — Progress Notes (Signed)
[name], female   DOB: [birth year], 79 y.o.   MRN: [id number]   Subjective:    [name], female    DOB: 22-Feb-1941, 79 y.o.   MRN: [id number]  HPI This visit occurred during the SARS-CoV-2 public health emergency.  Safety protocols were in place, including screening questions prior to the visit, additional usage of staff PPE, and extensive cleaning of exam room while observing appropriate contact time as indicated for disinfecting solutions.  Patient here for a scheduled follow up.  She reports increased stress.  Daughter recently diagnosed with breast cancer.  Discussed with her today.  She feels she is handling things relatively well.  No chest pain. No sob.  No acid reflux. No abdominal pain.  Bowels moving.  Blood pressure doing well.  Recent cologuard positive.  Referred to GI for colonoscopy.  Planning for colonoscopy.  On coumadin. Has a history of PE- bilateral.  Occurred 8-9 years ago.  Discussed stopping medication.  Discussed possible risk of stopping coumadin and increased clotting, etc.  She expressed understanding and is agreeable to stopping for colonoscopy.  States she has had to stop previously for procedures.     Past Medical History:  Diagnosis Date  . Anemia   . Arthritis   . Atrial fibrillation ([place])   . Breast cancer ([place])    s/p lumpectomy 1992.  s/p chemo and xrt left breast  . Diabetes mellitus ([place])   . Edema    feet/legs  . Gastric ulcer   . GERD (gastroesophageal reflux disease)   . HOH (hard of hearing)    aides  . Hypercholesterolemia   . Hypertension   . Pulmonary emboli [facility])    Past Surgical History:  Procedure Laterality Date  . BREAST LUMPECTOMY  03/06/1990   left breast  . CATARACT EXTRACTION W/PHACO Right 12/26/2016   Procedure: CATARACT EXTRACTION PHACO AND INTRAOCULAR LENS PLACEMENT (IOC)-RIGHT DIABETIC;  Surgeon: Birder Robson, MD;  Location: [facility] ORS;  Service: Ophthalmology;  Laterality:  Right;  [place] 00:47 AP% 24.5 CDE 11.62 Fluid pack lot # [id number]  . CATARACT EXTRACTION W/PHACO Left 01/23/2017   Procedure: CATARACT EXTRACTION PHACO AND INTRAOCULAR LENS PLACEMENT (IOC);  Surgeon: Birder Robson, MD;  Location: [facility] ORS;  Service: Ophthalmology;  Laterality: Left;  [place] 00:50 AP% 16.1 CDE 8.18 Fluid pack lot #[id number]   Family History  Problem Relation Age of Onset  . Heart disease Father   . Heart disease Brother        s/p CABG  . Colon cancer Neg Hx   . Breast cancer Neg Hx    Social History   Socioeconomic History  . Marital status: Widowed    Spouse name: Not on file  . Number of children: Not on file  . Years of education: Not on file  . Highest education level: Not on file  Occupational History  . Not on file  Tobacco Use  . Smoking status: Never Smoker  . Smokeless tobacco: Never Used  Substance and Sexual Activity  . Alcohol use: No    Alcohol/week: 0.0 standard drinks  . Drug use: No  . Sexual activity: Never  Other Topics Concern  . Not on file  Social History Narrative  . Not on file   Social Determinants of Health   Financial Resource Strain: Low Risk   . Difficulty of Paying Living Expenses: Not hard at all  Food Insecurity: No Food Insecurity  . Worried About Estate manager/land agent  of Food in the Last Year: Never true  . Ran Out of Food in the Last Year: Never true  Transportation Needs: No Transportation Needs  . Lack of Transportation (Medical): No  . Lack of Transportation (Non-Medical): No  Physical Activity:   . Days of Exercise per Week:   . Minutes of Exercise per Session:   Stress: No Stress Concern Present  . Feeling of Stress : Not at all  Social Connections: Unknown  . Frequency of Communication with Friends and Family: More than three times a week  . Frequency of Social Gatherings with Friends and Family: Not on file  . Attends Religious Services: Not on file  . Active Member of Clubs or Organizations: Not on file  .  Attends Archivist Meetings: Not on file  . Marital Status: Not on file    Outpatient Encounter Medications as of 05/20/2019  Medication Sig  . amLODipine (NORVASC) 2.5 MG tablet Take 1 tablet (2.5 mg total) by mouth daily.  . CONTOUR TEST test strip USE TO TEST BLOOD SUGAR ONCE EVERY WEEK  . diphenhydrAMINE (BENADRYL) 25 mg capsule Take 25 mg at bedtime as needed by mouth for sleep.   . ferrous sulfate (FEROSUL) 325 (65 FE) MG tablet Take 1 tablet (325 mg total) by mouth daily.  Marland Kitchen losartan (COZAAR) 100 MG tablet Take 1 tablet (100 mg total) by mouth daily.  . Multiple Vitamin (MULTIVITAMIN WITH MINERALS) TABS tablet Take 1 tablet daily by mouth.  . pantoprazole (PROTONIX) 40 MG tablet TAKE 1 TABLET(40 MG) BY MOUTH DAILY  . simvastatin (ZOCOR) 10 MG tablet TAKE 1 TABLET BY MOUTH EVERY DAY  . warfarin (COUMADIN) 2.5 MG tablet Takes 2 tablets (5 mg) on Tuesdays and Thursdays and Saturday, and 1 tablet (2.5 mg)  all other days of the week   No facility-administered encounter medications on file as of 05/20/2019.   Review of Systems  Constitutional: Negative for appetite change and unexpected weight change.  HENT: Negative for congestion and sinus pressure.   Eyes: Negative for pain and visual disturbance.  Respiratory: Negative for cough, chest tightness and shortness of breath.   Cardiovascular: Negative for chest pain, palpitations and leg swelling.  Gastrointestinal: Negative for abdominal pain, diarrhea, nausea and vomiting.  Genitourinary: Negative for difficulty urinating and dysuria.  Musculoskeletal: Negative for joint swelling and myalgias.       Back is doing better.   Skin: Negative for color change and rash.  Neurological: Negative for dizziness and headaches.  Hematological: Negative for adenopathy. Does not bruise/bleed easily.  Psychiatric/Behavioral: Negative for agitation and dysphoric mood.       Increased stress as outlined.         Objective:    Physical  Exam Constitutional:      General: She is not in acute distress.    Appearance: Normal appearance.  HENT:     Head: Normocephalic and atraumatic.     Right Ear: External ear normal.     Left Ear: External ear normal.  Eyes:     General: No scleral icterus.       Right eye: No discharge.        Left eye: No discharge.     Conjunctiva/sclera: Conjunctivae normal.  Neck:     Thyroid: No thyromegaly.  Cardiovascular:     Rate and Rhythm: Normal rate and regular rhythm.  Pulmonary:     Effort: No respiratory distress.     Breath sounds: Normal breath sounds. No  wheezing.  Abdominal:     General: Bowel sounds are normal.     Palpations: Abdomen is soft.     Tenderness: There is no abdominal tenderness.  Musculoskeletal:        General: No swelling or tenderness.     Cervical back: Neck supple. No tenderness.  Lymphadenopathy:     Cervical: No cervical adenopathy.  Skin:    Findings: No erythema or rash.  Neurological:     Mental Status: She is alert.  Psychiatric:        Mood and Affect: Mood normal.        Behavior: Behavior normal.     BP (!) 142/82   Pulse 88   Temp (!) 97.3 F (36.3 C)   Resp 16   Ht '5\' 9"'  (1.753 m)   Wt 249 lb (112.9 kg)   SpO2 96%   BMI 36.77 kg/m  Wt Readings from Last 3 Encounters:  05/20/19 249 lb (112.9 kg)  03/21/19 248 lb (112.5 kg)  03/13/19 248 lb (112.5 kg)     Lab Results  Component Value Date   WBC 7.4 05/06/2019   HGB 14.3 05/06/2019   HCT 42.9 05/06/2019   PLT 216.0 05/06/2019   GLUCOSE 122 (H) 05/06/2019   CHOL 156 12/11/2018   TRIG 100.0 12/11/2018   HDL 55.90 12/11/2018   LDLCALC 80 12/11/2018   ALT 15 05/06/2019   AST 19 05/06/2019   NA 137 05/06/2019   K 4.5 05/06/2019   CL 102 05/06/2019   CREATININE 0.87 05/06/2019   BUN 15 05/06/2019   CO2 31 05/06/2019   TSH 3.15 05/06/2019   INR 2.2 (H) 05/06/2019   HGBA1C 5.9 05/06/2019   MICROALBUR <0.7 11/14/2017    Fluoro (C-Arm) (<60 min) (No  Report)  Result Date: 03/11/2019 Fluoro was used, but no Radiologist interpretation will be provided. Please refer to "NOTES" tab for provider progress note.      Assessment & Plan:   Problem List Items Addressed This Visit    Compression fracture of thoracic vertebra (HCC) (Chronic)    Back pain is better.  Has seen ortho.        Diabetic polyneuropathy associated with type 2 diabetes mellitus (HCC)    Low carb diet and exercise.  Follow met b and a1c.  No pain.        Essential hypertension    Blood pressure under good control.  Continue same medication regimen - losartan and amlodipine  Follow pressures.  Have her spot check her pressure.  Follow metabolic panel.        Hypercholesteremia    On simvastatin.  Low cholesterol diet and exercise.  Follow lipid panel and liver function tests.        Positive colorectal cancer screening using Cologuard test    cologuard positive.  Referred to GI.  Planning for colonoscopy.  Discussed stopping her coumadin for her procedure.  Discussed risk of stopping including clots, etc. She expressed understanding and stated she was in agreement to hold coumadin and proceed with procedure.  States has had to stop coumadin previously for procedures.  Will notify GI.        Type 2 diabetes mellitus without complications (HCC)    Low carb diet and exercise.  Follow met b and a1c.            Einar Pheasant, MD

## 2019-05-21 ENCOUNTER — Telehealth: Payer: Self-pay | Admitting: Internal Medicine

## 2019-05-21 NOTE — Telephone Encounter (Signed)
Her blood pressure was "ok" in the office yesterday.  If she knows someone that can check her pressure, have them check.  Ok to schedule f/u with me and have her bring her cuff.

## 2019-05-21 NOTE — Telephone Encounter (Signed)
Patient stated she has checked her bp today multiple times and the readings she are getting are 85/193, 119/181, 120/84 and 79/147. Also noted that her machine has been reading error. Patient stated she is not having any acute symptoms and feels fine. She was in office this week.. She is using a wrist cuff because she cannot use the arm cuff. Advised that those readings do not sound accurate. Something may be wrong with her cuff. She just wanted [place] to be aware in case she needed to do anything.

## 2019-05-21 NOTE — Telephone Encounter (Signed)
Pt called in and said she checked her blood pressure after 8am and it was 85/193. She then checked an hour later an it is 119/181. She wants to speak with a nurse as soon as possible. She said she wasn't having any symptoms.

## 2019-05-22 NOTE — Telephone Encounter (Signed)
Pt checked BP 145/82. Scheduled f/u 06/09/19. Pt will continue to monitor pressure and bring cuff to appt.

## 2019-05-25 ENCOUNTER — Telehealth: Payer: Self-pay | Admitting: Internal Medicine

## 2019-05-25 ENCOUNTER — Encounter: Payer: Self-pay | Admitting: Internal Medicine

## 2019-05-25 DIAGNOSIS — R195 Other fecal abnormalities: Secondary | ICD-10-CM | POA: Insufficient documentation

## 2019-05-25 NOTE — Telephone Encounter (Signed)
Please call and notify GI - Dr Verlin Grills office to send another form for colonoscopy and let them know that we would like to get her colonoscopy rescheduled.  Let me know if any questions or problems.

## 2019-05-25 NOTE — Assessment & Plan Note (Signed)
Back pain is better.  Has seen ortho.

## 2019-05-25 NOTE — Assessment & Plan Note (Signed)
On simvastatin.  Low cholesterol diet and exercise.  Follow lipid panel and liver function tests.   

## 2019-05-25 NOTE — Assessment & Plan Note (Signed)
cologuard positive.  Referred to GI.  Planning for colonoscopy.  Discussed stopping her coumadin for her procedure.  Discussed risk of stopping including clots, etc. She expressed understanding and stated she was in agreement to hold coumadin and proceed with procedure.  States has had to stop coumadin previously for procedures.  Will notify GI.

## 2019-05-25 NOTE — Assessment & Plan Note (Signed)
Low carb diet and exercise.  Follow met b and a1c.  No pain.

## 2019-05-25 NOTE — Assessment & Plan Note (Signed)
Blood pressure under good control.  Continue same medication regimen - losartan and amlodipine  Follow pressures.  Have her spot check her pressure.  Follow metabolic panel.

## 2019-05-25 NOTE — Assessment & Plan Note (Signed)
Low carb diet and exercise.  Follow met b and a1c.   

## 2019-05-27 NOTE — Telephone Encounter (Signed)
Form requested from GI

## 2019-05-27 NOTE — Telephone Encounter (Signed)
Form signed. Placed in box.  plPt aware that will need to stop 7 days prior to colonoscopy.  GI office will notify her when to stop (once is scheduled).  She is also aware that they will notify her when to restart and that it could be up to 5 days after procedure.  Discussed risk of stopping the coumadin.  Pt understands risk and is agreeable to stop.

## 2019-05-27 NOTE — Telephone Encounter (Signed)
Form given to Dr Nicki Reaper for signature.

## 2019-06-05 NOTE — Telephone Encounter (Signed)
Form was placed in scan. Printed and faxed to GI

## 2019-06-09 ENCOUNTER — Other Ambulatory Visit (INDEPENDENT_AMBULATORY_CARE_PROVIDER_SITE_OTHER): Payer: Medicare Other

## 2019-06-09 ENCOUNTER — Ambulatory Visit (INDEPENDENT_AMBULATORY_CARE_PROVIDER_SITE_OTHER): Payer: Medicare Other

## 2019-06-09 ENCOUNTER — Other Ambulatory Visit: Payer: Self-pay

## 2019-06-09 ENCOUNTER — Ambulatory Visit: Payer: Medicare Other | Admitting: Internal Medicine

## 2019-06-09 ENCOUNTER — Other Ambulatory Visit: Payer: Medicare Other

## 2019-06-09 DIAGNOSIS — Z7901 Long term (current) use of anticoagulants: Secondary | ICD-10-CM

## 2019-06-09 DIAGNOSIS — N3941 Urge incontinence: Secondary | ICD-10-CM

## 2019-06-09 NOTE — Progress Notes (Signed)
PTNS  Session # Maintenance   Health & Social Factors: No changes Caffeine: 2 cups Alcohol: 0 Daytime voids #per day: 6-8 Night-time voids #per night: 2 Urgency: mild Incontinence Episodes #per day: 1 Ankle used: Right Treatment Setting: 7 Feeling/ Response: toe flex Comments: Patient tolerated procedure well.  Preformed By: Kerman Passey, RMA   Follow Up: 1 month

## 2019-06-09 NOTE — Patient Instructions (Signed)
Tracking Your Bladder Symptoms    Patient Name:___________________________________________________   Sample: Day   Daytime Voids  Nighttime Voids Urgency for the Day(0-4) Number of Accidents Beverage Comments  Monday IIII II 2 I Water IIII Coffee  I      Week Starting:____________________________________   Day Daytime  Voids Nighttime  Voids Urgency for  The Day(0-4) Number of Accidents Beverages Comments                                                           This week my symptoms were:  O much better  O better O the same O worse   

## 2019-06-10 LAB — PROTIME-INR
INR: 2.7 — ABNORMAL HIGH
Prothrombin Time: 26.2 s — ABNORMAL HIGH (ref 9.0–11.5)

## 2019-06-12 ENCOUNTER — Other Ambulatory Visit: Payer: Self-pay

## 2019-06-12 ENCOUNTER — Telehealth: Payer: Self-pay

## 2019-06-12 ENCOUNTER — Ambulatory Visit (INDEPENDENT_AMBULATORY_CARE_PROVIDER_SITE_OTHER): Payer: Medicare Other | Admitting: Internal Medicine

## 2019-06-12 DIAGNOSIS — R195 Other fecal abnormalities: Secondary | ICD-10-CM

## 2019-06-12 DIAGNOSIS — I10 Essential (primary) hypertension: Secondary | ICD-10-CM

## 2019-06-12 DIAGNOSIS — E119 Type 2 diabetes mellitus without complications: Secondary | ICD-10-CM | POA: Diagnosis not present

## 2019-06-12 DIAGNOSIS — S22080D Wedge compression fracture of T11-T12 vertebra, subsequent encounter for fracture with routine healing: Secondary | ICD-10-CM | POA: Diagnosis not present

## 2019-06-12 DIAGNOSIS — Z853 Personal history of malignant neoplasm of breast: Secondary | ICD-10-CM

## 2019-06-12 DIAGNOSIS — E7800 Pure hypercholesterolemia, unspecified: Secondary | ICD-10-CM | POA: Diagnosis not present

## 2019-06-12 DIAGNOSIS — E1142 Type 2 diabetes mellitus with diabetic polyneuropathy: Secondary | ICD-10-CM | POA: Diagnosis not present

## 2019-06-12 MED ORDER — AMLODIPINE BESYLATE 2.5 MG PO TABS: 2.5000 mg | ORAL_TABLET | Freq: Every day | ORAL | 2 refills | Status: DC

## 2019-06-12 NOTE — Progress Notes (Signed)
Patient ID: Jo Ann Rowland [place], female   DOB: 05-21-1940, 79 y.o.   MRN: [id number]   Subjective:    Patient ID: Jo Ann Rowland [place], female    DOB: 1940-03-12, 79 y.o.   MRN: [id number]  HPI This visit occurred during the SARS-CoV-2 public health emergency.  Safety protocols were in place, including screening questions prior to the visit, additional usage of staff PPE, and extensive cleaning of exam room while observing appropriate contact time as indicated for disinfecting solutions.  Patient here for a scheduled follow up.  Increased stress with her daughter's health issues.  Daughter diagnosed with breast cancer.  She feels she is handling things relatively well.  Denies any chest pain or sob.  No acid reflux.  No abdominal pain or cramping.  Bowels stable.  She was found to have a positive cologuard.  Has a f/u phone visit with GI tomorrow.  Planning for colonoscopy.  Have discussed with her stopping the coumadin and possible risk of stopping.  She is in agreement - stating she has had to stop for back procedures previously.  Understands risk.  Blood pressure remaining a little elevated.  On questioning, it appears she is not taking her amlodipine.  She is taking losartan.    Past Medical History:  Diagnosis Date  . Anemia   . Arthritis   . Atrial fibrillation ([place])   . Breast cancer ([place])    s/p lumpectomy 1992.  s/p chemo and xrt left breast  . Diabetes mellitus ([place])   . Edema    feet/legs  . Gastric ulcer   . GERD (gastroesophageal reflux disease)   . HOH (hard of hearing)    aides  . Hypercholesterolemia   . Hypertension   . Pulmonary emboli [facility])    Past Surgical History:  Procedure Laterality Date  . BREAST LUMPECTOMY  03/06/1990   left breast  . CATARACT EXTRACTION W/PHACO Right 12/26/2016   Procedure: CATARACT EXTRACTION PHACO AND INTRAOCULAR LENS PLACEMENT (IOC)-RIGHT DIABETIC;  Surgeon: Birder Robson, MD;  Location: [facility] ORS;  Service: Ophthalmology;  Laterality:  Right;  [place] 00:47 AP% 24.5 CDE 11.62 Fluid pack lot # [id number]  . CATARACT EXTRACTION W/PHACO Left 01/23/2017   Procedure: CATARACT EXTRACTION PHACO AND INTRAOCULAR LENS PLACEMENT (IOC);  Surgeon: Birder Robson, MD;  Location: [facility] ORS;  Service: Ophthalmology;  Laterality: Left;  [place] 00:50 AP% 16.1 CDE 8.18 Fluid pack lot #[id number] H   Family History  Problem Relation Age of Onset  . Heart disease Father   . Heart disease Brother        s/p CABG  . Colon cancer Neg Hx   . Breast cancer Neg Hx    Social History   Socioeconomic History  . Marital status: Widowed    Spouse name: Not on file  . Number of children: Not on file  . Years of education: Not on file  . Highest education level: Not on file  Occupational History  . Not on file  Tobacco Use  . Smoking status: Never Smoker  . Smokeless tobacco: Never Used  Substance and Sexual Activity  . Alcohol use: No    Alcohol/week: 0.0 standard drinks  . Drug use: No  . Sexual activity: Never  Other Topics Concern  . Not on file  Social History Narrative  . Not on file   Social Determinants of Health   Financial Resource Strain: Low Risk   . Difficulty of Paying Living Expenses: Not hard at all  Food Insecurity: No  Food Insecurity  . Worried About Charity fundraiser in the Last Year: Never true  . Ran Out of Food in the Last Year: Never true  Transportation Needs: No Transportation Needs  . Lack of Transportation (Medical): No  . Lack of Transportation (Non-Medical): No  Physical Activity:   . Days of Exercise per Week:   . Minutes of Exercise per Session:   Stress: No Stress Concern Present  . Feeling of Stress : Not at all  Social Connections: Unknown  . Frequency of Communication with Friends and Family: More than three times a week  . Frequency of Social Gatherings with Friends and Family: Not on file  . Attends Religious Services: Not on file  . Active Member of Clubs or Organizations: Not on file  .  Attends Archivist Meetings: Not on file  . Marital Status: Not on file    Outpatient Encounter Medications as of 06/12/2019  Medication Sig  . amLODipine (NORVASC) 2.5 MG tablet Take 1 tablet (2.5 mg total) by mouth daily.  . CONTOUR TEST test strip USE TO TEST BLOOD SUGAR ONCE EVERY WEEK  . diphenhydrAMINE (BENADRYL) 25 mg capsule Take 25 mg at bedtime as needed by mouth for sleep.   . ferrous sulfate (FEROSUL) 325 (65 FE) MG tablet Take 1 tablet (325 mg total) by mouth daily.  Marland Kitchen losartan (COZAAR) 100 MG tablet Take 1 tablet (100 mg total) by mouth daily.  . Multiple Vitamin (MULTIVITAMIN WITH MINERALS) TABS tablet Take 1 tablet daily by mouth.  . pantoprazole (PROTONIX) 40 MG tablet TAKE 1 TABLET(40 MG) BY MOUTH DAILY  . simvastatin (ZOCOR) 10 MG tablet TAKE 1 TABLET BY MOUTH EVERY DAY  . warfarin (COUMADIN) 2.5 MG tablet Takes 2 tablets (5 mg) on Tuesdays and Thursdays and Saturday, and 1 tablet (2.5 mg)  all other days of the week  . [DISCONTINUED] amLODipine (NORVASC) 2.5 MG tablet Take 1 tablet (2.5 mg total) by mouth daily.   No facility-administered encounter medications on file as of 06/12/2019.   Review of Systems  Constitutional: Negative for appetite change and unexpected weight change.  HENT: Negative for congestion and sinus pressure.   Respiratory: Negative for cough, chest tightness and shortness of breath.   Cardiovascular: Negative for chest pain, palpitations and leg swelling.  Gastrointestinal: Negative for abdominal pain, diarrhea, nausea and vomiting.  Genitourinary: Negative for difficulty urinating and dysuria.  Musculoskeletal: Negative for joint swelling and myalgias.  Skin: Negative for color change and rash.  Neurological: Negative for dizziness, light-headedness and headaches.  Psychiatric/Behavioral: Negative for agitation and dysphoric mood.       Increased stress as outlined.         Objective:    Physical Exam Constitutional:      General:  She is not in acute distress.    Appearance: Normal appearance.  HENT:     Head: Normocephalic and atraumatic.     Right Ear: External ear normal.     Left Ear: External ear normal.  Eyes:     General: No scleral icterus.       Right eye: No discharge.        Left eye: No discharge.  Neck:     Thyroid: No thyromegaly.  Cardiovascular:     Rate and Rhythm: Normal rate and regular rhythm.  Pulmonary:     Effort: No respiratory distress.     Breath sounds: Normal breath sounds. No wheezing.  Abdominal:     General: Bowel  sounds are normal.     Palpations: Abdomen is soft.     Tenderness: There is no abdominal tenderness.  Musculoskeletal:        General: No tenderness.     Cervical back: Neck supple. No tenderness.     Comments: No increased swelling.   Lymphadenopathy:     Cervical: No cervical adenopathy.  Skin:    Findings: No erythema or rash.  Neurological:     Mental Status: She is alert.  Psychiatric:        Mood and Affect: Mood normal.        Behavior: Behavior normal.    Blood pressure rechecked by me:  146/82  BP 132/78   Pulse 78   Temp (!) 97.2 F (36.2 C)   Resp 16   Ht '5\' 9"'  (1.753 m)   Wt 249 lb (112.9 kg)   SpO2 98%   BMI 36.77 kg/m  Wt Readings from Last 3 Encounters:  06/12/19 249 lb (112.9 kg)  05/20/19 249 lb (112.9 kg)  03/21/19 248 lb (112.5 kg)     Lab Results  Component Value Date   WBC 7.4 05/06/2019   HGB 14.3 05/06/2019   HCT 42.9 05/06/2019   PLT 216.0 05/06/2019   GLUCOSE 122 (H) 05/06/2019   CHOL 156 12/11/2018   TRIG 100.0 12/11/2018   HDL 55.90 12/11/2018   LDLCALC 80 12/11/2018   ALT 15 05/06/2019   AST 19 05/06/2019   NA 137 05/06/2019   K 4.5 05/06/2019   CL 102 05/06/2019   CREATININE 0.87 05/06/2019   BUN 15 05/06/2019   CO2 31 05/06/2019   TSH 3.15 05/06/2019   INR 2.7 (H) 06/09/2019   HGBA1C 5.9 05/06/2019   MICROALBUR <0.7 11/14/2017       Assessment & Plan:   Problem List Items Addressed This  Visit    Compression fracture of thoracic vertebra ([place]) (Chronic)    Has seen ortho.  Has had injections previously.  Back stable.       Diabetic polyneuropathy associated with type 2 diabetes mellitus (HCC)    Low carb diet and exercise.  On no medication.  Follow met b and a1c.   Lab Results  Component Value Date   HGBA1C 5.9 05/06/2019        Essential hypertension    Blood pressure remaining slightly elevated.  On questioning, she has not been taking amlodipine.  Restart 2.9m amlodipine q day.  Follow pressures.  Get her back in soon to reassess.        Relevant Medications   amLODipine (NORVASC) 2.5 MG tablet   History of breast cancer    Mammogram 12/05/19 - Birads I.       Hypercholesteremia    On simvastatin.  Low cholesterol diet and exercise.  Follow lipid panel and liver function tests.        Relevant Medications   amLODipine (NORVASC) 2.5 MG tablet   Positive colorectal cancer screening using Cologuard test    Recent cologuard positive.  Referred to GI.  Planning for colonoscopy.  Have discussed stopping her coumadin for the procedure.  Discussed risk of stopping, including clots, etc.  She expressed understanding and was in agreement to stop and proceed with procedure - stating "I have had to stop for injections previously".  Understands that depending on colonoscopy findings - the number of days off coumadin may be extended.  Has f/u phone visit tomorrow with GI.       Type  2 diabetes mellitus without complications ([place])    Low carb diet and exercise.  Follow met b and a1c. On no medication.           Einar Pheasant, MD

## 2019-06-12 NOTE — Patient Instructions (Signed)
Restart amlodipine 2.5mg   - take one tablet in the evening

## 2019-06-12 NOTE — Telephone Encounter (Signed)
Gastroenterology Pre-Procedure Review  Request Date: 07/08/19 Requesting Physician: Dr. Allen Norris  PATIENT REVIEW QUESTIONS: The patient responded to the following health history questions as indicated:    1. Are you having any GI issues? no 2. Do you have a personal history of Polyps? no 3. Do you have a family history of Colon Cancer or Polyps? no 4. Diabetes Mellitus? no 5. Joint replacements in the past 12 months?no 6. Major health problems in the past 3 months?no 7. Any artificial heart valves, MVP, or defibrillator?no    MEDICATIONS & ALLERGIES:    Patient reports the following regarding taking any anticoagulation/antiplatelet therapy:   Plavix, Coumadin, Eliquis, Xarelto, Lovenox, Pradaxa, Brilinta, or Effient? yes (Warfarin.  Patient has been advised to stop on 07/01/19 as advised by Dr. Einar Pheasant and resumeas determined by GI) Aspirin? no  Patient confirms/reports the following medications:  Current Outpatient Medications  Medication Sig Dispense Refill  . amLODipine (NORVASC) 2.5 MG tablet Take 1 tablet (2.5 mg total) by mouth daily. 30 tablet 2  . CONTOUR TEST test strip USE TO TEST BLOOD SUGAR ONCE EVERY WEEK 25 strip 1  . diphenhydrAMINE (BENADRYL) 25 mg capsule Take 25 mg at bedtime as needed by mouth for sleep.     . ferrous sulfate (FEROSUL) 325 (65 FE) MG tablet Take 1 tablet (325 mg total) by mouth daily. 90 tablet 2  . losartan (COZAAR) 100 MG tablet Take 1 tablet (100 mg total) by mouth daily. 90 tablet 3  . Multiple Vitamin (MULTIVITAMIN WITH MINERALS) TABS tablet Take 1 tablet daily by mouth.    . pantoprazole (PROTONIX) 40 MG tablet TAKE 1 TABLET(40 MG) BY MOUTH DAILY 90 tablet 1  . simvastatin (ZOCOR) 10 MG tablet TAKE 1 TABLET BY MOUTH EVERY DAY 90 tablet 1  . warfarin (COUMADIN) 2.5 MG tablet Takes 2 tablets (5 mg) on Tuesdays and Thursdays and Saturday, and 1 tablet (2.5 mg)  all other days of the week 142 tablet 2   No current facility-administered  medications for this visit.    Patient confirms/reports the following allergies:  Allergies  Allergen Reactions  . Penicillins Other (See Comments)    Unknown- pt states been a long time ago  Has patient had a PCN reaction causing immediate rash, facial/tongue/throat swelling, SOB or lightheadedness with hypotension: Unknown Has patient had a PCN reaction causing severe rash involving mucus membranes or skin necrosis: Unknown Has patient had a PCN reaction that required hospitalization: Unknown Has patient had a PCN reaction occurring within the last 10 years: Unknown If all of the above answers are "NO", then may proceed with Cephalosporin use.  Marland Kitchen Penicillin V Potassium Nausea And Vomiting    No orders of the defined types were placed in this encounter.   AUTHORIZATION INFORMATION Primary Insurance: 1D#: Group #:  Secondary Insurance: 1D#: Group #:  SCHEDULE INFORMATION: Date: 07/08/19 Time: Location:[facility]

## 2019-06-13 ENCOUNTER — Ambulatory Visit (INDEPENDENT_AMBULATORY_CARE_PROVIDER_SITE_OTHER): Payer: Self-pay | Admitting: Gastroenterology

## 2019-06-13 DIAGNOSIS — Z1211 Encounter for screening for malignant neoplasm of colon: Secondary | ICD-10-CM

## 2019-06-13 NOTE — Progress Notes (Signed)
  Request Date: 07/08/19 Requesting Physician: Dr. Mellody Life INFORMATION: Date: 07/08/19 Time: [place]  Triage Completed by phone on 06/12/19.  Thanks,  [place], [place]

## 2019-06-16 ENCOUNTER — Encounter: Payer: Self-pay | Admitting: Internal Medicine

## 2019-06-16 NOTE — Assessment & Plan Note (Signed)
On simvastatin.  Low cholesterol diet and exercise.  Follow lipid panel and liver function tests.   

## 2019-06-16 NOTE — Assessment & Plan Note (Signed)
Blood pressure remaining slightly elevated.  On questioning, she has not been taking amlodipine.  Restart 2.5mg  amlodipine q day.  Follow pressures.  Get her back in soon to reassess.

## 2019-06-16 NOTE — Assessment & Plan Note (Signed)
Low carb diet and exercise.  Follow met b and a1c. On no medication.  

## 2019-06-16 NOTE — Assessment & Plan Note (Signed)
Recent cologuard positive.  Referred to GI.  Planning for colonoscopy.  Have discussed stopping her coumadin for the procedure.  Discussed risk of stopping, including clots, etc.  She expressed understanding and was in agreement to stop and proceed with procedure - stating "I have had to stop for injections previously".  Understands that depending on colonoscopy findings - the number of days off coumadin may be extended.  Has f/u phone visit tomorrow with GI.

## 2019-06-16 NOTE — Assessment & Plan Note (Signed)
Mammogram 12/05/19 - Birads I.

## 2019-06-16 NOTE — Assessment & Plan Note (Signed)
Low carb diet and exercise.  On no medication.  Follow met b and a1c.   Lab Results  Component Value Date   HGBA1C 5.9 05/06/2019

## 2019-06-16 NOTE — Assessment & Plan Note (Signed)
Has seen ortho.  Has had injections previously.  Back stable.

## 2019-06-19 ENCOUNTER — Other Ambulatory Visit: Payer: Self-pay

## 2019-06-19 ENCOUNTER — Other Ambulatory Visit (INDEPENDENT_AMBULATORY_CARE_PROVIDER_SITE_OTHER): Payer: Medicare Other

## 2019-06-19 DIAGNOSIS — Z7901 Long term (current) use of anticoagulants: Secondary | ICD-10-CM | POA: Diagnosis not present

## 2019-06-19 LAB — PROTIME-INR
INR: 2.8 ratio — ABNORMAL HIGH (ref 0.8–1.0)
Prothrombin Time: 30.9 s — ABNORMAL HIGH (ref 9.6–13.1)

## 2019-06-20 ENCOUNTER — Telehealth: Payer: Self-pay | Admitting: *Deleted

## 2019-06-20 NOTE — Telephone Encounter (Signed)
Patient aware and voiced understanding to results.

## 2019-06-20 NOTE — Telephone Encounter (Signed)
Left message to call office concerning results.

## 2019-06-20 NOTE — Telephone Encounter (Signed)
-----   Message from Einar Pheasant, MD sent at 06/20/2019  6:59 AM EDT ----- Notify pt that her INR is ok.  Continue same coumadin dose and recheck pt/inr in 4 weeks.  She is seeing GI.  They are planning a colonoscopy.  She will need to hold coumadin - per their recommendation (stop 7 days prior to colonoscopy - per note).

## 2019-06-20 NOTE — Telephone Encounter (Signed)
Pt returned call back and would like a call back.

## 2019-06-26 DIAGNOSIS — D3132 Benign neoplasm of left choroid: Secondary | ICD-10-CM | POA: Diagnosis not present

## 2019-06-26 DIAGNOSIS — H26491 Other secondary cataract, right eye: Secondary | ICD-10-CM | POA: Diagnosis not present

## 2019-06-26 LAB — HM DIABETES EYE EXAM

## 2019-07-04 ENCOUNTER — Other Ambulatory Visit
Admission: RE | Admit: 2019-07-04 | Discharge: 2019-07-04 | Disposition: A | Discharge status: Home / Self Care | Payer: Medicare Other | Source: Ambulatory Visit | Attending: Gastroenterology | Admitting: Gastroenterology

## 2019-07-04 ENCOUNTER — Other Ambulatory Visit: Payer: Self-pay

## 2019-07-04 DIAGNOSIS — Z01812 Encounter for preprocedural laboratory examination: Secondary | ICD-10-CM | POA: Insufficient documentation

## 2019-07-04 DIAGNOSIS — Z20822 Contact with and (suspected) exposure to covid-19: Secondary | ICD-10-CM | POA: Diagnosis not present

## 2019-07-05 LAB — SARS CORONAVIRUS 2 (TAT 6-24 HRS): SARS Coronavirus 2: NEGATIVE

## 2019-07-07 ENCOUNTER — Ambulatory Visit (INDEPENDENT_AMBULATORY_CARE_PROVIDER_SITE_OTHER): Payer: Medicare Other | Admitting: Podiatry

## 2019-07-07 ENCOUNTER — Encounter: Payer: Self-pay | Admitting: Podiatry

## 2019-07-07 ENCOUNTER — Other Ambulatory Visit: Payer: Self-pay

## 2019-07-07 VITALS — Temp 97.6°F

## 2019-07-07 DIAGNOSIS — M79675 Pain in left toe(s): Secondary | ICD-10-CM | POA: Diagnosis not present

## 2019-07-07 DIAGNOSIS — E1142 Type 2 diabetes mellitus with diabetic polyneuropathy: Secondary | ICD-10-CM | POA: Diagnosis not present

## 2019-07-07 DIAGNOSIS — B351 Tinea unguium: Secondary | ICD-10-CM

## 2019-07-07 DIAGNOSIS — M79674 Pain in right toe(s): Secondary | ICD-10-CM | POA: Diagnosis not present

## 2019-07-07 DIAGNOSIS — D689 Coagulation defect, unspecified: Secondary | ICD-10-CM

## 2019-07-07 NOTE — Progress Notes (Signed)
This patient returns to my office for at risk foot care.  This patient requires this care by a professional since this patient will be at risk due to having type 2 diabetes and coagulation defect  This patient is unable to cut nails herself since the patient cannot reach her nails.These nails are painful walking and wearing shoes.  This patient presents for at risk foot care today.  General Appearance  Alert, conversant and in no acute stress.  Vascular  Dorsalis pedis and posterior tibial  pulses are palpable  bilaterally.  Capillary return is within normal limits  bilaterally. Temperature is within normal limits  bilaterally.  Neurologic  Senn-Weinstein monofilament wire test diminished   bilaterally. Muscle power within normal limits bilaterally.  Nails Thick disfigured discolored nails with subungual debris  from hallux to fifth toes bilaterally. No evidence of bacterial infection or drainage bilaterally.  Orthopedic  No limitations of motion  feet .  No crepitus or effusions noted.  No bony pathology or digital deformities noted.  Skin  normotropic skin with no porokeratosis noted bilaterally.  No signs of infections or ulcers noted.     Onychomycosis  Pain in right toes  Pain in left toes  Consent was obtained for treatment procedures.   Mechanical debridement of nails 1-5  bilaterally performed with a nail nipper.  Filed with dremel without incident.    Return office visit    10 weeks                  Told patient to return for periodic foot care and evaluation due to potential at risk complications.   Gardiner Barefoot DPM

## 2019-07-08 ENCOUNTER — Ambulatory Visit: Payer: Medicare Other | Admitting: Certified Registered"

## 2019-07-08 ENCOUNTER — Ambulatory Visit
Admission: RE | Admit: 2019-07-08 | Discharge: 2019-07-08 | Disposition: A | Discharge status: Home / Self Care | Payer: Medicare Other | Attending: Gastroenterology | Admitting: Gastroenterology

## 2019-07-08 ENCOUNTER — Encounter: Payer: Self-pay | Admitting: Gastroenterology

## 2019-07-08 ENCOUNTER — Other Ambulatory Visit: Payer: Self-pay

## 2019-07-08 ENCOUNTER — Encounter
Admission: RE | Disposition: A | Discharge status: Home / Self Care | Payer: Self-pay | Source: Home / Self Care | Attending: Gastroenterology

## 2019-07-08 DIAGNOSIS — E119 Type 2 diabetes mellitus without complications: Secondary | ICD-10-CM | POA: Insufficient documentation

## 2019-07-08 DIAGNOSIS — Z86711 Personal history of pulmonary embolism: Secondary | ICD-10-CM | POA: Insufficient documentation

## 2019-07-08 DIAGNOSIS — K641 Second degree hemorrhoids: Secondary | ICD-10-CM | POA: Diagnosis not present

## 2019-07-08 DIAGNOSIS — K5730 Diverticulosis of large intestine without perforation or abscess without bleeding: Secondary | ICD-10-CM | POA: Diagnosis not present

## 2019-07-08 DIAGNOSIS — Z7901 Long term (current) use of anticoagulants: Secondary | ICD-10-CM | POA: Diagnosis not present

## 2019-07-08 DIAGNOSIS — E7800 Pure hypercholesterolemia, unspecified: Secondary | ICD-10-CM | POA: Diagnosis not present

## 2019-07-08 DIAGNOSIS — K5790 Diverticulosis of intestine, part unspecified, without perforation or abscess without bleeding: Secondary | ICD-10-CM | POA: Diagnosis not present

## 2019-07-08 DIAGNOSIS — M1990 Unspecified osteoarthritis, unspecified site: Secondary | ICD-10-CM | POA: Diagnosis not present

## 2019-07-08 DIAGNOSIS — Z880 Allergy status to penicillin: Secondary | ICD-10-CM | POA: Diagnosis not present

## 2019-07-08 DIAGNOSIS — K219 Gastro-esophageal reflux disease without esophagitis: Secondary | ICD-10-CM | POA: Insufficient documentation

## 2019-07-08 DIAGNOSIS — I4891 Unspecified atrial fibrillation: Secondary | ICD-10-CM | POA: Insufficient documentation

## 2019-07-08 DIAGNOSIS — D124 Benign neoplasm of descending colon: Secondary | ICD-10-CM | POA: Insufficient documentation

## 2019-07-08 DIAGNOSIS — Z853 Personal history of malignant neoplasm of breast: Secondary | ICD-10-CM | POA: Diagnosis not present

## 2019-07-08 DIAGNOSIS — D120 Benign neoplasm of cecum: Secondary | ICD-10-CM | POA: Insufficient documentation

## 2019-07-08 DIAGNOSIS — Z79899 Other long term (current) drug therapy: Secondary | ICD-10-CM | POA: Diagnosis not present

## 2019-07-08 DIAGNOSIS — K635 Polyp of colon: Secondary | ICD-10-CM | POA: Diagnosis not present

## 2019-07-08 DIAGNOSIS — R195 Other fecal abnormalities: Secondary | ICD-10-CM | POA: Diagnosis not present

## 2019-07-08 DIAGNOSIS — I10 Essential (primary) hypertension: Secondary | ICD-10-CM | POA: Diagnosis not present

## 2019-07-08 DIAGNOSIS — D126 Benign neoplasm of colon, unspecified: Secondary | ICD-10-CM | POA: Diagnosis not present

## 2019-07-08 HISTORY — PX: COLONOSCOPY WITH PROPOFOL: SHX5780

## 2019-07-08 LAB — GLUCOSE, CAPILLARY: Glucose-Capillary: 115 mg/dL — ABNORMAL HIGH (ref 70–99)

## 2019-07-08 SURGERY — COLONOSCOPY WITH PROPOFOL
Anesthesia: General

## 2019-07-08 MED ORDER — LIDOCAINE HCL (PF) 2 % IJ SOLN
INTRAMUSCULAR | Status: AC
  Filled 2019-07-08: qty 5

## 2019-07-08 MED ORDER — PROPOFOL 500 MG/50ML IV EMUL
INTRAVENOUS | Status: AC
  Filled 2019-07-08: qty 50

## 2019-07-08 MED ORDER — LIDOCAINE HCL (PF) 1 % IJ SOLN
INTRAMUSCULAR | Status: AC
  Administered 2019-07-08: 0.2 mL
  Filled 2019-07-08: qty 2

## 2019-07-08 MED ORDER — ONDANSETRON HCL 4 MG/2ML IJ SOLN
INTRAMUSCULAR | Status: AC
  Administered 2019-07-08: 4 mg
  Filled 2019-07-08: qty 2

## 2019-07-08 MED ORDER — PROPOFOL 10 MG/ML IV BOLUS
INTRAVENOUS | Status: DC | PRN
Start: 2019-07-08 — End: 2019-07-08
  Administered 2019-07-08: 40 mg via INTRAVENOUS
  Administered 2019-07-08: 30 mg via INTRAVENOUS

## 2019-07-08 MED ORDER — PHENYLEPHRINE HCL (PRESSORS) 10 MG/ML IV SOLN
INTRAVENOUS | Status: DC | PRN
  Administered 2019-07-08: 100 ug via INTRAVENOUS

## 2019-07-08 MED ORDER — PROPOFOL 500 MG/50ML IV EMUL
INTRAVENOUS | Status: DC | PRN
  Administered 2019-07-08: 100 ug/kg/min via INTRAVENOUS

## 2019-07-08 MED ORDER — SODIUM CHLORIDE 0.9 % IV SOLN
INTRAVENOUS | Status: DC
  Administered 2019-07-08: 1000 mL via INTRAVENOUS

## 2019-07-08 MED ORDER — PHENYLEPHRINE HCL (PRESSORS) 10 MG/ML IV SOLN
INTRAVENOUS | Status: AC
  Filled 2019-07-08: qty 1

## 2019-07-08 MED ORDER — LIDOCAINE HCL (CARDIAC) PF 100 MG/5ML IV SOSY
PREFILLED_SYRINGE | INTRAVENOUS | Status: DC | PRN
  Administered 2019-07-08: 80 mg via INTRAVENOUS

## 2019-07-08 NOTE — Transfer of Care (Signed)
Immediate Anesthesia Transfer of Care Note  Patient: Anita Walker  Procedure(s) Performed: COLONOSCOPY WITH PROPOFOL (N/A )  Patient Location: PACU  Anesthesia Type:General  Level of Consciousness: awake and drowsy  Airway & Oxygen Therapy: Patient Spontanous Breathing and Patient connected to nasal cannula oxygen  Post-op Assessment: Report given to RN and Post -op Vital signs reviewed and stable  Post vital signs: Reviewed  Last Vitals: Vitals Value Taken Time  BP 109/47 07/08/19 0826  Temp    Pulse 83 07/08/19 0828  Resp 20 07/08/19 0828  SpO2 98 % 07/08/19 0828  Vitals shown include unvalidated device data.  Last Pain:  Vitals:   07/08/19 0826  TempSrc: Temporal  PainSc: 0-No pain         Complications: No apparent anesthesia complications

## 2019-07-08 NOTE — Anesthesia Preprocedure Evaluation (Signed)
Anesthesia Evaluation  Patient identified by MRN, date of birth, ID band Patient awake    Reviewed: Allergy & Precautions, NPO status , Patient's Chart, lab work & pertinent test results  History of Anesthesia Complications Negative for: history of anesthetic complications  Airway Mallampati: II       Dental  (+) Upper Dentures, Lower Dentures   Pulmonary neg sleep apnea, neg COPD, Not current smoker,           Cardiovascular hypertension, Pt. on medications (-) Past MI and (-) CHF + dysrhythmias Atrial Fibrillation (-) Valvular Problems/Murmurs     Neuro/Psych neg Seizures    GI/Hepatic Neg liver ROS, PUD, GERD  Medicated,  Endo/Other  diabetes, Type 2, Oral Hypoglycemic Agents  Renal/GU negative Renal ROS     Musculoskeletal   Abdominal   Peds  Hematology  (+) anemia ,   Anesthesia Other Findings   Reproductive/Obstetrics                             Anesthesia Physical Anesthesia Plan  ASA: III  Anesthesia Plan: General   Post-op Pain Management:    Induction:   PONV Risk Score and Plan: 3 and Propofol infusion, TIVA and Treatment may vary due to age or medical condition  Airway Management Planned: Nasal Cannula  Additional Equipment:   Intra-op Plan:   Post-operative Plan:   Informed Consent: I have reviewed the patients History and Physical, chart, labs and discussed the procedure including the risks, benefits and alternatives for the proposed anesthesia with the patient or authorized representative who has indicated his/her understanding and acceptance.       Plan Discussed with:   Anesthesia Plan Comments:         Anesthesia Quick Evaluation

## 2019-07-08 NOTE — Op Note (Signed)
[facility] Patient Name: Anita Walker Procedure Date: 07/08/2019 7:25 AM MRN: [id number] Account #: [id number] Date of Birth: 09-04-1940 Admit Type: Outpatient Age: 79 Room: [facility] ENDO ROOM 4 Gender: Female Note Status: Finalized Procedure:             Colonoscopy Indications:           Positive Cologuard test Providers:             Lucilla Lame MD, MD Referring MD:          Einar Pheasant, MD (Referring MD) Medicines:             Propofol per Anesthesia Complications:         No immediate complications. Procedure:             Pre-Anesthesia Assessment:                        - Prior to the procedure, a History and Physical was                         performed, and patient medications and allergies were                         reviewed. The patient's tolerance of previous                         anesthesia was also reviewed. The risks and benefits                         of the procedure and the sedation options and risks                         were discussed with the patient. All questions were                         answered, and informed consent was obtained. Prior                         Anticoagulants: The patient has taken no previous                         anticoagulant or antiplatelet agents. ASA Grade                         Assessment: II - A patient with mild systemic disease.                         After reviewing the risks and benefits, the patient                         was deemed in satisfactory condition to undergo the                         procedure.                        After obtaining informed consent, the colonoscope was  passed under direct vision. Throughout the procedure,                         the patient's blood pressure, pulse, and oxygen                         saturations were monitored continuously. The                         Colonoscope was introduced through the anus and                          advanced to the the cecum, identified by appendiceal                         orifice and ileocecal valve. The colonoscopy was                         performed without difficulty. The patient tolerated                         the procedure well. The quality of the bowel                         preparation was excellent. Findings:      The perianal and digital rectal examinations were normal.      Multiple small-mouthed diverticula were found in the sigmoid colon.      Two sessile polyps were found in the descending colon. The polyps were 4       to 5 mm in size. These polyps were removed with a cold snare. Resection       and retrieval were complete.      A 3 mm polyp was found in the cecum. The polyp was sessile. The polyp       was removed with a cold snare. Resection and retrieval were complete.      Non-bleeding internal hemorrhoids were found during retroflexion. The       hemorrhoids were Grade II (internal hemorrhoids that prolapse but reduce       spontaneously). Impression:            - Diverticulosis in the sigmoid colon.                        - Two 4 to 5 mm polyps in the descending colon,                         removed with a cold snare. Resected and retrieved.                        - One 3 mm polyp in the cecum, removed with a cold                         snare. Resected and retrieved.                        - Non-bleeding internal hemorrhoids. Recommendation:        - Discharge patient to home.                        -  Resume previous diet.                        - Continue present medications.                        - Await pathology results. Procedure Code(s):     --- Professional ---                        [phone], Colonoscopy, flexible; with removal of                         tumor(s), polyp(s), or other lesion(s) by snare                         technique Diagnosis Code(s):     --- Professional ---                        R19.5, Other fecal  abnormalities                        K63.5, Polyp of colon CPT copyright 2019 American Medical Association. All rights reserved. The codes documented in this report are preliminary and upon coder review may  be revised to meet current compliance requirements. Lucilla Lame MD, MD 07/08/2019 8:25:09 AM This report has been signed electronically. Number of Addenda: 0 Note Initiated On: 07/08/2019 7:25 AM Scope Withdrawal Time: 0 hours 13 minutes 46 seconds  Total Procedure Duration: 0 hours 20 minutes 10 seconds  Estimated Blood Loss:  Estimated blood loss: none.      [facility]

## 2019-07-08 NOTE — Anesthesia Postprocedure Evaluation (Signed)
Anesthesia Post Note  Patient: Anita Walker  Procedure(s) Performed: COLONOSCOPY WITH PROPOFOL (N/A )  Patient location during evaluation: Endoscopy Anesthesia Type: General Level of consciousness: awake and alert Pain management: pain level controlled Vital Signs Assessment: post-procedure vital signs reviewed and stable Respiratory status: spontaneous breathing and respiratory function stable Cardiovascular status: stable Anesthetic complications: no     Last Vitals:  Vitals:   07/08/19 0713 07/08/19 0826  BP: (!) 160/94 (!) 109/47  Pulse: 91 78  Resp: 18 15  Temp: 36.6 C   SpO2: 97% 99%    Last Pain:  Vitals:   07/08/19 0826  TempSrc: Temporal  PainSc: 0-No pain                 Chevis Weisensel K

## 2019-07-08 NOTE — H&P (Signed)
Lucilla Lame, MD [address] [place], [place] [address] [phone] Fax : [phone]  Primary Care Physician:  Einar Pheasant, MD Primary Gastroenterologist:  Dr. Allen Norris  Pre-Procedure History & Physical: HPI:  Anita Walker is a 79 y.o. female is here for an colonoscopy.   Past Medical History:  Diagnosis Date  . Anemia   . Arthritis   . Atrial fibrillation ([place])   . Breast cancer ([place])    s/p lumpectomy 1992.  s/p chemo and xrt left breast  . Diabetes mellitus ([place])   . Edema    feet/legs  . Gastric ulcer   . GERD (gastroesophageal reflux disease)   . HOH (hard of hearing)    aides  . Hypercholesterolemia   . Hypertension   . Pulmonary emboli [facility])     Past Surgical History:  Procedure Laterality Date  . BREAST LUMPECTOMY  03/06/1990   left breast  . CATARACT EXTRACTION W/PHACO Right 12/26/2016   Procedure: CATARACT EXTRACTION PHACO AND INTRAOCULAR LENS PLACEMENT (IOC)-RIGHT DIABETIC;  Surgeon: Birder Robson, MD;  Location: [facility] ORS;  Service: Ophthalmology;  Laterality: Right;  [place] 00:47 AP% 24.5 CDE 11.62 Fluid pack lot # [id number]  . CATARACT EXTRACTION W/PHACO Left 01/23/2017   Procedure: CATARACT EXTRACTION PHACO AND INTRAOCULAR LENS PLACEMENT (IOC);  Surgeon: Birder Robson, MD;  Location: [facility] ORS;  Service: Ophthalmology;  Laterality: Left;  [place] 00:50 AP% 16.1 CDE 8.18 Fluid pack lot [id number]    Prior to Admission medications   Medication Sig Start Date End Date Taking? Authorizing Provider  amLODipine (NORVASC) 2.5 MG tablet Take 1 tablet (2.5 mg total) by mouth daily. 06/12/19  Yes Einar Pheasant, MD  losartan (COZAAR) 100 MG tablet Take 1 tablet (100 mg total) by mouth daily. 05/02/19  Yes Einar Pheasant, MD  pantoprazole (PROTONIX) 40 MG tablet TAKE 1 TABLET(40 MG) BY MOUTH DAILY 05/02/19  Yes Einar Pheasant, MD  simvastatin (ZOCOR) 10 MG tablet TAKE 1 TABLET BY MOUTH EVERY DAY 05/02/19  Yes Einar Pheasant, MD  CONTOUR  TEST test strip USE TO TEST BLOOD SUGAR ONCE EVERY WEEK 02/13/19   Einar Pheasant, MD  diphenhydrAMINE (BENADRYL) 25 mg capsule Take 25 mg at bedtime as needed by mouth for sleep.     [provider]  ferrous sulfate (FEROSUL) 325 (65 FE) MG tablet Take 1 tablet (325 mg total) by mouth daily. 05/02/19   Einar Pheasant, MD  Multiple Vitamin (MULTIVITAMIN WITH MINERALS) TABS tablet Take 1 tablet daily by mouth.    [provider]  warfarin (COUMADIN) 2.5 MG tablet Takes 2 tablets (5 mg) on Tuesdays and Thursdays and Saturday, and 1 tablet (2.5 mg)  all other days of the week 08/14/18   Einar Pheasant, MD    Allergies as of 06/13/2019 - Review Complete 06/13/2019  Allergen Reaction Noted  . Penicillins Other (See Comments) 01/01/2012  . Penicillin v potassium Nausea And Vomiting 02/09/2015    Family History  Problem Relation Age of Onset  . Heart disease Father   . Heart disease Brother        s/p CABG  . Colon cancer Neg Hx   . Breast cancer Neg Hx     Social History   Socioeconomic History  . Marital status: Widowed    Spouse name: Not on file  . Number of children: Not on file  . Years of education: Not on file  . Highest education level: Not on file  Occupational History  . Not on  file  Tobacco Use  . Smoking status: Never Smoker  . Smokeless tobacco: Never Used  Substance and Sexual Activity  . Alcohol use: No    Alcohol/week: 0.0 standard drinks  . Drug use: No  . Sexual activity: Never  Other Topics Concern  . Not on file  Social History Narrative  . Not on file   Social Determinants of Health   Financial Resource Strain: Low Risk   . Difficulty of Paying Living Expenses: Not hard at all  Food Insecurity: No Food Insecurity  . Worried About Charity fundraiser in the Last Year: Never true  . Ran Out of Food in the Last Year: Never true  Transportation Needs: No Transportation Needs  . Lack of Transportation (Medical): No  . Lack of  Transportation (Non-Medical): No  Physical Activity:   . Days of Exercise per Week:   . Minutes of Exercise per Session:   Stress: No Stress Concern Present  . Feeling of Stress : Not at all  Social Connections: Unknown  . Frequency of Communication with Friends and Family: More than three times a week  . Frequency of Social Gatherings with Friends and Family: Not on file  . Attends Religious Services: Not on file  . Active Member of Clubs or Organizations: Not on file  . Attends Archivist Meetings: Not on file  . Marital Status: Not on file  Intimate Partner Violence: Not At Risk  . Fear of Current or Ex-Partner: No  . Emotionally Abused: No  . Physically Abused: No  . Sexually Abused: No    Review of Systems: See HPI, otherwise negative ROS  Physical Exam: BP (!) 160/94   Pulse 91   Temp 97.8 F (36.6 C) (Tympanic)   Resp 18   Ht 5\' 9"  (1.753 m)   Wt 108.9 kg   SpO2 97%   BMI 35.44 kg/m  General:   Alert,  pleasant and cooperative in NAD Head:  Normocephalic and atraumatic. Neck:  Supple; no masses or thyromegaly. Lungs:  Clear throughout to auscultation.    Heart:  Regular rate and rhythm. Abdomen:  Soft, nontender and nondistended. Normal bowel sounds, without guarding, and without rebound.   Neurologic:  Alert and  oriented x4;  grossly normal neurologically.  Impression/Plan: Anita Walker is here for an colonoscopy to be performed for positive cologuard  Risks, benefits, limitations, and alternatives regarding  colonoscopy have been reviewed with the patient.  Questions have been answered.  All parties agreeable.   Lucilla Lame, MD  07/08/2019, 7:36 AM

## 2019-07-09 ENCOUNTER — Encounter: Payer: Self-pay | Admitting: *Deleted

## 2019-07-09 LAB — SURGICAL PATHOLOGY

## 2019-07-10 ENCOUNTER — Other Ambulatory Visit: Payer: Medicare Other

## 2019-07-10 ENCOUNTER — Encounter: Payer: Self-pay | Admitting: Gastroenterology

## 2019-07-11 ENCOUNTER — Other Ambulatory Visit: Payer: Medicare Other

## 2019-07-14 ENCOUNTER — Ambulatory Visit: Payer: Self-pay

## 2019-07-14 ENCOUNTER — Other Ambulatory Visit: Payer: Self-pay

## 2019-07-14 ENCOUNTER — Ambulatory Visit (INDEPENDENT_AMBULATORY_CARE_PROVIDER_SITE_OTHER): Payer: Medicare Other

## 2019-07-14 DIAGNOSIS — N3946 Mixed incontinence: Secondary | ICD-10-CM

## 2019-07-14 DIAGNOSIS — N3941 Urge incontinence: Secondary | ICD-10-CM

## 2019-07-14 NOTE — Patient Instructions (Signed)
Tracking Your Bladder Symptoms    Patient Name:___________________________________________________   Sample: Day   Daytime Voids  Nighttime Voids Urgency for the Day(0-4) Number of Accidents Beverage Comments  Monday IIII II 2 I Water IIII Coffee  I      Week Starting:____________________________________   Day Daytime  Voids Nighttime  Voids Urgency for  The Day(0-4) Number of Accidents Beverages Comments                                                           This week my symptoms were:  O much better  O better O the same O worse   

## 2019-07-14 NOTE — Progress Notes (Signed)
PTNS  Session # Maintenence  Health & Social Factors: No change Caffeine: 2 Alcohol: 0 Daytime voids #per day: 10 Night-time voids #per night: 2 Urgency: mild Incontinence Episodes #per day: 2 per week on average Ankle used: Right Treatment Setting: 8 Feeling/ Response: Toe Flex Comments: no complications noted.  Performed By: Bradly Bienenstock, CMA   Follow Up:   RTC in 1 month

## 2019-07-15 ENCOUNTER — Other Ambulatory Visit: Payer: Self-pay | Admitting: Internal Medicine

## 2019-07-21 ENCOUNTER — Other Ambulatory Visit: Payer: Self-pay

## 2019-07-21 ENCOUNTER — Encounter: Payer: Self-pay | Admitting: Internal Medicine

## 2019-07-21 ENCOUNTER — Ambulatory Visit (INDEPENDENT_AMBULATORY_CARE_PROVIDER_SITE_OTHER): Payer: Medicare Other | Admitting: Internal Medicine

## 2019-07-21 VITALS — BP 130/72 | HR 85 | Temp 97.8°F | Resp 16 | Ht 69.0 in | Wt 250.0 lb

## 2019-07-21 DIAGNOSIS — Z853 Personal history of malignant neoplasm of breast: Secondary | ICD-10-CM | POA: Diagnosis not present

## 2019-07-21 DIAGNOSIS — I10 Essential (primary) hypertension: Secondary | ICD-10-CM | POA: Diagnosis not present

## 2019-07-21 DIAGNOSIS — E7800 Pure hypercholesterolemia, unspecified: Secondary | ICD-10-CM | POA: Diagnosis not present

## 2019-07-21 DIAGNOSIS — Z7901 Long term (current) use of anticoagulants: Secondary | ICD-10-CM | POA: Diagnosis not present

## 2019-07-21 DIAGNOSIS — E1142 Type 2 diabetes mellitus with diabetic polyneuropathy: Secondary | ICD-10-CM | POA: Diagnosis not present

## 2019-07-21 DIAGNOSIS — E119 Type 2 diabetes mellitus without complications: Secondary | ICD-10-CM | POA: Diagnosis not present

## 2019-07-21 NOTE — Progress Notes (Signed)
[name], female   DOB: 05-14-1940, 79 y.o.   MRN: [id number]   Subjective:    [name], female    DOB: 1941-02-03, 79 y.o.   MRN: [id number]  HPI This visit occurred during the SARS-CoV-2 public health emergency.  Safety protocols were in place, including screening questions prior to the visit, additional usage of staff PPE, and extensive cleaning of exam room while observing appropriate contact time as indicated for disinfecting solutions.  Patient here for a scheduled follow up.  She reports she is doing well. Increased stress with her daughter's health.  Overall she feels she is handling things relatively well.  Trying to stay active.  No chest pain.  Breathing stable.  No acid reflux, abdominal pain or bowel change reported.  Sugars doing well.  Had recent colonoscopy.  Overall she feels she is doing relatively well.  Here to f/u on her blood pressure.  Blood pressures mostly averaging 120-130s/70s.  Doing well on amlodipine and losartan.    Past Medical History:  Diagnosis Date  . Anemia   . Arthritis   . Atrial fibrillation ([place])   . Breast cancer ([place])    s/p lumpectomy 1992.  s/p chemo and xrt left breast  . Diabetes mellitus ([place])   . Edema    feet/legs  . Gastric ulcer   . GERD (gastroesophageal reflux disease)   . HOH (hard of hearing)    aides  . Hypercholesterolemia   . Hypertension   . Pulmonary emboli [facility])    Past Surgical History:  Procedure Laterality Date  . BREAST LUMPECTOMY  03/06/1990   left breast  . CATARACT EXTRACTION W/PHACO Right 12/26/2016   Procedure: CATARACT EXTRACTION PHACO AND INTRAOCULAR LENS PLACEMENT (IOC)-RIGHT DIABETIC;  Surgeon: Birder Robson, MD;  Location: [facility] ORS;  Service: Ophthalmology;  Laterality: Right;  [place] 00:47 AP% 24.5 CDE 11.62 Fluid pack lot # [id number]  . CATARACT EXTRACTION W/PHACO Left 01/23/2017   Procedure: CATARACT EXTRACTION PHACO AND INTRAOCULAR LENS PLACEMENT (IOC);  Surgeon:  Birder Robson, MD;  Location: [facility] ORS;  Service: Ophthalmology;  Laterality: Left;  [place] 00:50 AP% 16.1 CDE 8.18 Fluid pack lot #[id number]  . COLONOSCOPY WITH PROPOFOL N/A 07/08/2019   Procedure: COLONOSCOPY WITH PROPOFOL;  Surgeon: Lucilla Lame, MD;  Location: [facility] ENDOSCOPY;  Service: Endoscopy;  Laterality: N/A;   Family History  Problem Relation Age of Onset  . Heart disease Father   . Heart disease Brother        s/p CABG  . Colon cancer Neg Hx   . Breast cancer Neg Hx    Social History   Socioeconomic History  . Marital status: Widowed    Spouse name: Not on file  . Number of children: Not on file  . Years of education: Not on file  . Highest education level: Not on file  Occupational History  . Not on file  Tobacco Use  . Smoking status: Never Smoker  . Smokeless tobacco: Never Used  Substance and Sexual Activity  . Alcohol use: No    Alcohol/week: 0.0 standard drinks  . Drug use: No  . Sexual activity: Never  Other Topics Concern  . Not on file  Social History Narrative  . Not on file   Social Determinants of Health   Financial Resource Strain: Low Risk   . Difficulty of Paying Living Expenses: Not hard at all  Food Insecurity: No Food Insecurity  . Worried About [employer] of  Food in the Last Year: Never true  . Ran Out of Food in the Last Year: Never true  Transportation Needs: No Transportation Needs  . Lack of Transportation (Medical): No  . Lack of Transportation (Non-Medical): No  Physical Activity:   . Days of Exercise per Week:   . Minutes of Exercise per Session:   Stress: No Stress Concern Present  . Feeling of Stress : Not at all  Social Connections: Unknown  . Frequency of Communication with Friends and Family: More than three times a week  . Frequency of Social Gatherings with Friends and Family: Not on file  . Attends Religious Services: Not on file  . Active Member of Clubs or Organizations: Not on file  . Attends Archivist  Meetings: Not on file  . Marital Status: Not on file    Outpatient Encounter Medications as of 07/21/2019  Medication Sig  . amLODipine (NORVASC) 2.5 MG tablet TAKE 1 TABLET(2.5 MG) BY MOUTH DAILY  . CONTOUR TEST test strip USE TO TEST BLOOD SUGAR ONCE EVERY WEEK  . diphenhydrAMINE (BENADRYL) 25 mg capsule Take 25 mg at bedtime as needed by mouth for sleep.   . ferrous sulfate (FEROSUL) 325 (65 FE) MG tablet Take 1 tablet (325 mg total) by mouth daily.  Marland Kitchen losartan (COZAAR) 100 MG tablet Take 1 tablet (100 mg total) by mouth daily.  . Multiple Vitamin (MULTIVITAMIN WITH MINERALS) TABS tablet Take 1 tablet daily by mouth.  . pantoprazole (PROTONIX) 40 MG tablet TAKE 1 TABLET(40 MG) BY MOUTH DAILY  . simvastatin (ZOCOR) 10 MG tablet TAKE 1 TABLET BY MOUTH EVERY DAY  . warfarin (COUMADIN) 2.5 MG tablet Takes 2 tablets (5 mg) on Tuesdays and Thursdays and Saturday, and 1 tablet (2.5 mg)  all other days of the week   No facility-administered encounter medications on file as of 07/21/2019.   Review of Systems  Constitutional: Negative for appetite change and unexpected weight change.  HENT: Negative for congestion and sinus pressure.   Respiratory: Negative for cough, chest tightness and shortness of breath.   Cardiovascular: Negative for chest pain, palpitations and leg swelling.  Gastrointestinal: Negative for abdominal pain, diarrhea, nausea and vomiting.  Genitourinary: Negative for difficulty urinating and dysuria.  Musculoskeletal: Negative for joint swelling and myalgias.  Skin: Negative for color change and rash.  Neurological: Negative for dizziness, light-headedness and headaches.  Psychiatric/Behavioral: Negative for agitation and dysphoric mood.       Objective:    Physical Exam Constitutional:      General: She is not in acute distress.    Appearance: Normal appearance.  HENT:     Head: Normocephalic and atraumatic.     Right Ear: External ear normal.     Left Ear:  External ear normal.  Eyes:     General: No scleral icterus.       Right eye: No discharge.        Left eye: No discharge.     Conjunctiva/sclera: Conjunctivae normal.  Neck:     Thyroid: No thyromegaly.  Cardiovascular:     Rate and Rhythm: Normal rate and regular rhythm.  Pulmonary:     Effort: No respiratory distress.     Breath sounds: Normal breath sounds. No wheezing.  Abdominal:     General: Bowel sounds are normal.     Palpations: Abdomen is soft.     Tenderness: There is no abdominal tenderness.  Musculoskeletal:        General: No swelling  or tenderness.     Cervical back: Neck supple. No tenderness.  Lymphadenopathy:     Cervical: No cervical adenopathy.  Skin:    Findings: No erythema or rash.  Neurological:     Mental Status: She is alert.  Psychiatric:        Mood and Affect: Mood normal.        Behavior: Behavior normal.     BP 130/72   Pulse 85   Temp 97.8 F (36.6 C)   Resp 16   Ht _0  (1.753 m)   Wt 250 lb (113.4 kg)   SpO2 96%   BMI 36.92 kg/m  Wt Readings from Last 3 Encounters:  07/21/19 250 lb (113.4 kg)  07/08/19 240 lb (108.9 kg)  06/12/19 249 lb (112.9 kg)     Lab Results  Component Value Date   WBC 7.4 05/06/2019   HGB 14.3 05/06/2019   HCT 42.9 05/06/2019   PLT 216.0 05/06/2019   GLUCOSE 122 (H) 05/06/2019   CHOL 156 12/11/2018   TRIG 100.0 12/11/2018   HDL 55.90 12/11/2018   LDLCALC 80 12/11/2018   ALT 15 05/06/2019   AST 19 05/06/2019   NA 137 05/06/2019   K 4.5 05/06/2019   CL 102 05/06/2019   CREATININE 0.87 05/06/2019   BUN 15 05/06/2019   CO2 31 05/06/2019   TSH 3.15 05/06/2019   INR 2.2 (H) 07/21/2019   HGBA1C 5.9 05/06/2019   MICROALBUR <0.7 11/14/2017       Assessment & Plan:   Problem List Items Addressed This Visit    Chronic anticoagulation (Coumadin)    On coumadin.  Follow pt/inr.        Diabetic polyneuropathy associated with type 2 diabetes mellitus (HCC)    Low carb diet and exercise.   Follow met b and a1c.  Sugars as outlined.        History of breast cancer    12/05/19 - birads I.        Hypercholesteremia    On simvastatin.  Low cholesterol diet and exercise.  Follow lipid panel and liver function tests.        Hypertension    Blood pressures as outlined.  On amlodipine.  Follow pressures.  Follow metabolic panel.        Type 2 diabetes mellitus without complications (HCC)    Low carb diet and exercise.  Follow met b and a1c.         Other Visit Diagnoses    Anticoagulated on Coumadin    -  Primary   Relevant Orders   Protime-INR (Completed)       Einar Pheasant, MD

## 2019-07-22 LAB — PROTIME-INR
INR: 2.2 — ABNORMAL HIGH
Prothrombin Time: 22.2 s — ABNORMAL HIGH (ref 9.0–11.5)

## 2019-08-03 ENCOUNTER — Encounter: Payer: Self-pay | Admitting: Internal Medicine

## 2019-08-03 NOTE — Assessment & Plan Note (Signed)
On simvastatin.  Low cholesterol diet and exercise.  Follow lipid panel and liver function tests.   

## 2019-08-03 NOTE — Assessment & Plan Note (Signed)
Low carb diet and exercise.  Follow met b and a1c.   

## 2019-08-03 NOTE — Assessment & Plan Note (Signed)
On coumadin.  Follow pt/inr.  

## 2019-08-03 NOTE — Assessment & Plan Note (Signed)
Low carb diet and exercise.  Follow met b and a1c.  Sugars as outlined.   

## 2019-08-03 NOTE — Assessment & Plan Note (Signed)
Blood pressures as outlined.  On amlodipine.  Follow pressures.  Follow metabolic panel.

## 2019-08-03 NOTE — Assessment & Plan Note (Signed)
12/05/19 - birads I.

## 2019-08-05 ENCOUNTER — Telehealth: Payer: Self-pay | Admitting: *Deleted

## 2019-08-05 DIAGNOSIS — Z7901 Long term (current) use of anticoagulants: Secondary | ICD-10-CM

## 2019-08-05 NOTE — Telephone Encounter (Signed)
Please place future orders for lab appt.  

## 2019-08-05 NOTE — Telephone Encounter (Signed)
PT/INR ordered.  

## 2019-08-06 ENCOUNTER — Other Ambulatory Visit: Payer: Self-pay

## 2019-08-06 ENCOUNTER — Other Ambulatory Visit (INDEPENDENT_AMBULATORY_CARE_PROVIDER_SITE_OTHER): Payer: Medicare Other

## 2019-08-06 DIAGNOSIS — Z7901 Long term (current) use of anticoagulants: Secondary | ICD-10-CM

## 2019-08-06 LAB — PROTIME-INR
INR: 2.9 ratio — ABNORMAL HIGH (ref 0.8–1.0)
Prothrombin Time: 32.6 s — ABNORMAL HIGH (ref 9.6–13.1)

## 2019-08-12 ENCOUNTER — Ambulatory Visit: Payer: Medicare Other | Admitting: Pain Medicine

## 2019-08-14 ENCOUNTER — Other Ambulatory Visit: Payer: Self-pay

## 2019-08-14 ENCOUNTER — Ambulatory Visit
Admission: RE | Admit: 2019-08-14 | Discharge: 2019-08-14 | Disposition: A | Discharge status: Home / Self Care | Payer: Medicare Other | Source: Ambulatory Visit | Attending: Pain Medicine | Admitting: Pain Medicine

## 2019-08-14 ENCOUNTER — Encounter: Payer: Self-pay | Admitting: Pain Medicine

## 2019-08-14 ENCOUNTER — Ambulatory Visit (HOSPITAL_BASED_OUTPATIENT_CLINIC_OR_DEPARTMENT_OTHER): Payer: Medicare Other | Admitting: Pain Medicine

## 2019-08-14 VITALS — BP 192/86 | HR 87 | Temp 97.4°F | Resp 16 | Ht 69.0 in | Wt 248.0 lb

## 2019-08-14 DIAGNOSIS — G8929 Other chronic pain: Secondary | ICD-10-CM | POA: Diagnosis present

## 2019-08-14 DIAGNOSIS — M545 Low back pain, unspecified: Secondary | ICD-10-CM

## 2019-08-14 DIAGNOSIS — S32030S Wedge compression fracture of third lumbar vertebra, sequela: Secondary | ICD-10-CM | POA: Diagnosis present

## 2019-08-14 DIAGNOSIS — R55 Syncope and collapse: Secondary | ICD-10-CM | POA: Insufficient documentation

## 2019-08-14 DIAGNOSIS — M47816 Spondylosis without myelopathy or radiculopathy, lumbar region: Secondary | ICD-10-CM

## 2019-08-14 DIAGNOSIS — M47817 Spondylosis without myelopathy or radiculopathy, lumbosacral region: Secondary | ICD-10-CM | POA: Insufficient documentation

## 2019-08-14 DIAGNOSIS — Z7901 Long term (current) use of anticoagulants: Secondary | ICD-10-CM | POA: Insufficient documentation

## 2019-08-14 MED ORDER — MIDAZOLAM HCL 5 MG/5ML IJ SOLN
INTRAMUSCULAR | Status: AC
  Filled 2019-08-14: qty 5

## 2019-08-14 MED ORDER — GLYCOPYRROLATE 0.2 MG/ML IJ SOLN
INTRAMUSCULAR | Status: AC
  Filled 2019-08-14: qty 1

## 2019-08-14 MED ORDER — TRIAMCINOLONE ACETONIDE 40 MG/ML IJ SUSP
80.0000 mg | Freq: Once | INTRAMUSCULAR | Status: AC
  Administered 2019-08-14: 80 mg

## 2019-08-14 MED ORDER — MIDAZOLAM HCL 5 MG/5ML IJ SOLN
1.0000 mg | INTRAMUSCULAR | Status: DC | PRN
  Administered 2019-08-14: 2 mg via INTRAVENOUS

## 2019-08-14 MED ORDER — LIDOCAINE HCL 2 % IJ SOLN
INTRAMUSCULAR | Status: AC
  Filled 2019-08-14: qty 20

## 2019-08-14 MED ORDER — LIDOCAINE HCL 2 % IJ SOLN
20.0000 mL | Freq: Once | INTRAMUSCULAR | Status: AC
  Administered 2019-08-14: 400 mg

## 2019-08-14 MED ORDER — TRIAMCINOLONE ACETONIDE 40 MG/ML IJ SUSP
INTRAMUSCULAR | Status: AC
  Filled 2019-08-14: qty 2

## 2019-08-14 MED ORDER — LACTATED RINGERS IV SOLN
1000.0000 mL | Freq: Once | INTRAVENOUS | Status: AC
  Administered 2019-08-14: 1000 mL via INTRAVENOUS

## 2019-08-14 MED ORDER — FENTANYL CITRATE (PF) 100 MCG/2ML IJ SOLN
25.0000 ug | INTRAMUSCULAR | Status: DC | PRN
  Administered 2019-08-14: 50 ug via INTRAVENOUS

## 2019-08-14 MED ORDER — ROPIVACAINE HCL 2 MG/ML IJ SOLN
INTRAMUSCULAR | Status: AC
  Filled 2019-08-14: qty 20

## 2019-08-14 MED ORDER — GLYCOPYRROLATE 0.2 MG/ML IJ SOLN
0.2000 mg | Freq: Once | INTRAMUSCULAR | Status: AC
  Administered 2019-08-14: 0.2 mg via INTRAVENOUS

## 2019-08-14 MED ORDER — FENTANYL CITRATE (PF) 100 MCG/2ML IJ SOLN
INTRAMUSCULAR | Status: AC
  Filled 2019-08-14: qty 2

## 2019-08-14 MED ORDER — ROPIVACAINE HCL 2 MG/ML IJ SOLN
18.0000 mL | Freq: Once | INTRAMUSCULAR | Status: AC
  Administered 2019-08-14: 18 mL via PERINEURAL

## 2019-08-14 NOTE — Progress Notes (Signed)
Safety precautions to be maintained throughout the outpatient stay will include: orient to surroundings, keep bed in low position, maintain call bell within reach at all times, provide assistance with transfer out of bed and ambulation.  

## 2019-08-14 NOTE — Patient Instructions (Signed)

## 2019-08-14 NOTE — Progress Notes (Signed)
PROVIDER NOTE: Information contained herein reflects review and annotations entered in association with encounter. Interpretation of such information and data should be left to medically-trained personnel. Information provided to patient can be located elsewhere in the medical record under "Patient Instructions". Document created using STT-dictation technology, any transcriptional errors that may result from process are unintentional.    Patient: Anita Walker  Service Category: Procedure  Provider: Gaspar Cola, MD  DOB: 1941/02/14  DOS: 08/14/2019  Location: [facility]  MRN: [id number]  Setting: Ambulatory - outpatient  Referring Provider: Einar Pheasant, MD  Type: Established Patient  Specialty: Interventional Pain Management  PCP: Einar Pheasant, MD   Primary Reason for Visit: Interventional Pain Management Treatment. CC: Back Pain (low middle)  Procedure:          Anesthesia, Analgesia, Anxiolysis:  Type: Lumbar Facet, Medial Branch Block(s) #2  Primary Purpose: Diagnostic Region: Posterolateral Lumbosacral Spine Level: L2, L3, L4, L5, & S1 Medial Branch Level(s). Injecting these levels blocks the L3-4, L4-5, and L5-S1 lumbar facet joints. Laterality: Bilateral  Type: Moderate (Conscious) Sedation combined with Local Anesthesia Indication(s): Analgesia and Anxiety Route: Intravenous (IV) IV Access: Secured Sedation: Meaningful verbal contact was maintained at all times during the procedure  Local Anesthetic: Lidocaine 1-2%  NOTE: The patient experienced vasovagal symptoms during the placement of the IV access.  This was treated with glycopyrrolate IV.  Position: Prone   Indications: 1. Lumbar facet syndrome (Bilateral) (R>L)   2. Spondylosis without myelopathy or radiculopathy, lumbosacral region   3. Lumbar facet arthropathy (Multilevel)   4. Chronic low back pain (Primary Area of Pain) (Bilateral) (R>L) w/o sciatica   5. 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 Walker is a 79 y.o. (year old), female patient, seen today for interventional treatment. She  has a past surgical history that includes Cataract extraction w/PHACO (Right, 12/26/2016); Cataract extraction w/PHACO (Left, 01/23/2017); Breast lumpectomy (03/06/1990); and Colonoscopy with propofol (N/A, 07/08/2019). Ms. Walker has a current medication list which includes the following prescription(s): amlodipine, contour test, diphenhydramine, ferrous sulfate, losartan, multivitamin with minerals, pantoprazole, simvastatin, and warfarin, and the following Facility-Administered Medications: fentanyl and midazolam. Her primarily concern today is the Back Pain (low middle)  Initial Vital Signs:  Pulse/HCG Rate: 73ECG Heart Rate: 87 Temp: 97.6 F (36.4 C) Resp: (!) 181 BP: (!) 145/70 SpO2: 98 %  BMI: Estimated body mass index is 36.62 kg/m as calculated from the following:   Height as of this encounter: 5\' 9"  (1.753 m).   Weight as of this encounter: 248 lb (112.5 kg).  Risk Assessment: Allergies: Reviewed. She is allergic to penicillins and penicillin v potassium.  Allergy Precautions: None required Coagulopathies: Reviewed. None identified.  Blood-thinner therapy: None at this time Active Infection(s): Reviewed. None identified. Ms. Walker is afebrile  Site Confirmation: Ms. Walker was asked to confirm the procedure and laterality before marking the site Procedure checklist: Completed Consent: Before the procedure and under the influence of no sedative(s), amnesic(s), or anxiolytics, the patient was informed of the treatment options, risks and possible complications. To fulfill our ethical and legal obligations, as recommended by the American Medical Association's Code of Ethics, I have informed the patient of my clinical impression; the nature and  purpose of the treatment or procedure; the risks, benefits, and possible complications of the intervention; the alternatives, including doing nothing; the risk(s)  and benefit(s) of the alternative treatment(s) or procedure(s); and the risk(s) and benefit(s) of doing nothing. The patient was provided information about the general risks and possible complications associated with the procedure. These may include, but are not limited to: failure to achieve desired goals, infection, bleeding, organ or nerve damage, allergic reactions, paralysis, and death. In addition, the patient was informed of those risks and complications associated to Spine-related procedures, such as failure to decrease pain; infection (i.e.: Meningitis, epidural or intraspinal abscess); bleeding (i.e.: epidural hematoma, subarachnoid hemorrhage, or any other type of intraspinal or peri-dural bleeding); organ or nerve damage (i.e.: Any type of peripheral nerve, nerve root, or spinal cord injury) with subsequent damage to sensory, motor, and/or autonomic systems, resulting in permanent pain, numbness, and/or weakness of one or several areas of the body; allergic reactions; (i.e.: anaphylactic reaction); and/or death. Furthermore, the patient was informed of those risks and complications associated with the medications. These include, but are not limited to: allergic reactions (i.e.: anaphylactic or anaphylactoid reaction(s)); adrenal axis suppression; blood sugar elevation that in diabetics may result in ketoacidosis or comma; water retention that in patients with history of congestive heart failure may result in shortness of breath, pulmonary edema, and decompensation with resultant heart failure; weight gain; swelling or edema; medication-induced neural toxicity; particulate matter embolism and blood vessel occlusion with resultant organ, and/or nervous system infarction; and/or aseptic necrosis of one or more joints. Finally, the patient was  informed that Medicine is not an exact science; therefore, there is also the possibility of unforeseen or unpredictable risks and/or possible complications that may result in a catastrophic outcome. The patient indicated having understood very clearly. We have given the patient no guarantees and we have made no promises. Enough time was given to the patient to ask questions, all of which were answered to the patient's satisfaction. Ms. Walker has indicated that she wanted to continue with the procedure. Attestation: I, the ordering provider, attest that I have discussed with the patient the benefits, risks, side-effects, alternatives, likelihood of achieving goals, and potential problems during recovery for the procedure that I have provided informed consent. Date  Time: 08/14/2019  8:03 AM  Pre-Procedure Preparation:  Monitoring: As per clinic protocol. Respiration, ETCO2, SpO2, BP, heart rate and rhythm monitor placed and checked for adequate function Safety Precautions: Patient was assessed for positional comfort and pressure points before starting the procedure. Time-out: I initiated and conducted the "Time-out" before starting the procedure, as per protocol. The patient was asked to participate by confirming the accuracy of the "Time Out" information. Verification of the correct person, site, and procedure were performed and confirmed by me, the nursing staff, and the patient. "Time-out" conducted as per Joint Commission's Universal Protocol (UP.01.01.01). Time: 7353  Description of Procedure:          Laterality: Bilateral. The procedure was performed in identical fashion on both sides. Levels:  L2, L3, L4, L5, & S1 Medial Branch Level(s) Area Prepped: Posterior Lumbosacral Region DuraPrep (Iodine Povacrylex [0.7% available iodine] and Isopropyl Alcohol, 74% w/w) Safety Precautions: Aspiration looking for blood return was conducted prior to all injections. At no point did we inject any  substances, as a needle was being advanced. Before injecting, the patient was told to immediately notify me if she was experiencing any new onset of "ringing in the ears, or metallic taste in the mouth". No attempts were made at seeking any paresthesias. Safe injection practices and needle disposal techniques used. Medications properly checked for expiration  dates. SDV (single dose vial) medications used. After the completion of the procedure, all disposable equipment used was discarded in the proper designated medical waste containers. Local Anesthesia: Protocol guidelines were followed. The patient was positioned over the fluoroscopy table. The area was prepped in the usual manner. The time-out was completed. The target area was identified using fluoroscopy. A 12-in long, straight, sterile hemostat was used with fluoroscopic guidance to locate the targets for each level blocked. Once located, the skin was marked with an approved surgical skin marker. Once all sites were marked, the skin (epidermis, dermis, and hypodermis), as well as deeper tissues (fat, connective tissue and muscle) were infiltrated with a small amount of a short-acting local anesthetic, loaded on a 10cc syringe with a 25G, 1.5-in  Needle. An appropriate amount of time was allowed for local anesthetics to take effect before proceeding to the next step. Local Anesthetic: Lidocaine 2.0% The unused portion of the local anesthetic was discarded in the proper designated containers. Technical explanation of process:  L2 Medial Branch Nerve Block (MBB): The target area for the L2 medial branch is at the junction of the postero-lateral aspect of the superior articular process and the superior, posterior, and medial edge of the transverse process of L3. Under fluoroscopic guidance, a Quincke needle was inserted until contact was made with os over the superior postero-lateral aspect of the pedicular shadow (target area). After negative aspiration for  blood, 0.5 mL of the nerve block solution was injected without difficulty or complication. The needle was removed intact. L3 Medial Branch Nerve Block (MBB): The target area for the L3 medial branch is at the junction of the postero-lateral aspect of the superior articular process and the superior, posterior, and medial edge of the transverse process of L4. Under fluoroscopic guidance, a Quincke needle was inserted until contact was made with os over the superior postero-lateral aspect of the pedicular shadow (target area). After negative aspiration for blood, 0.5 mL of the nerve block solution was injected without difficulty or complication. The needle was removed intact. L4 Medial Branch Nerve Block (MBB): The target area for the L4 medial branch is at the junction of the postero-lateral aspect of the superior articular process and the superior, posterior, and medial edge of the transverse process of L5. Under fluoroscopic guidance, a Quincke needle was inserted until contact was made with os over the superior postero-lateral aspect of the pedicular shadow (target area). After negative aspiration for blood, 0.5 mL of the nerve block solution was injected without difficulty or complication. The needle was removed intact. L5 Medial Branch Nerve Block (MBB): The target area for the L5 medial branch is at the junction of the postero-lateral aspect of the superior articular process and the superior, posterior, and medial edge of the sacral ala. Under fluoroscopic guidance, a Quincke needle was inserted until contact was made with os over the superior postero-lateral aspect of the pedicular shadow (target area). After negative aspiration for blood, 0.5 mL of the nerve block solution was injected without difficulty or complication. The needle was removed intact. S1 Medial Branch Nerve Block (MBB): The target area for the S1 medial branch is at the posterior and inferior 6 o'clock position of the L5-S1 facet joint.  Under fluoroscopic guidance, the Quincke needle inserted for the L5 MBB was redirected until contact was made with os over the inferior and postero aspect of the sacrum, at the 6 o' clock position under the L5-S1 facet joint (Target area). After negative aspiration for  blood, 0.5 mL of the nerve block solution was injected without difficulty or complication. The needle was removed intact.  Nerve block solution: 0.2% PF-Ropivacaine + Triamcinolone (40 mg/mL) diluted to a final concentration of 4 mg of Triamcinolone/mL of Ropivacaine The unused portion of the solution was discarded in the proper designated containers. Procedural Needles: 22-gauge, 5-inch, Quincke needles used for all levels.  Once the entire procedure was completed, the treated area was cleaned, making sure to leave some of the prepping solution back to take advantage of its long term bactericidal properties.   Illustration of the posterior view of the lumbar spine and the posterior neural structures. Laminae of L2 through S1 are labeled. DPRL5, dorsal primary ramus of L5; DPRS1, dorsal primary ramus of S1; DPR3, dorsal primary ramus of L3; FJ, facet (zygapophyseal) joint L3-L4; I, inferior articular process of L4; LB1, lateral branch of dorsal primary ramus of L1; IAB, inferior articular branches from L3 medial branch (supplies L4-L5 facet joint); IBP, intermediate branch plexus; MB3, medial branch of dorsal primary ramus of L3; NR3, third lumbar nerve root; S, superior articular process of L5; SAB, superior articular branches from L4 (supplies L4-5 facet joint also); TP3, transverse process of L3.  Vitals:   08/14/19 0908 08/14/19 0917 08/14/19 0927 08/14/19 0937  BP:  (!) 218/90 (!) 183/76 (!) 192/86  Pulse:      Resp: 15 (!) 7 13 16   Temp:  97.7 F (36.5 C)  (!) 97.4 F (36.3 C)  TempSrc:  Temporal  Temporal  SpO2: 93% 98% 96% 98%  Weight:      Height:         Start Time: 0857 hrs. End Time: 0908 hrs.  Imaging Guidance  (Spinal):          Type of Imaging Technique: Fluoroscopy Guidance (Spinal) Indication(s): Assistance in needle guidance and placement for procedures requiring needle placement in or near specific anatomical locations not easily accessible without such assistance. Exposure Time: Please see nurses notes. Contrast: None used. Fluoroscopic Guidance: I was personally present during the use of fluoroscopy. "Tunnel Vision Technique" used to obtain the best possible view of the target area. Parallax error corrected before commencing the procedure. "Direction-depth-direction" technique used to introduce the needle under continuous pulsed fluoroscopy. Once target was reached, antero-posterior, oblique, and lateral fluoroscopic projection used confirm needle placement in all planes. Images permanently stored in EMR. Interpretation: No contrast injected. I personally interpreted the imaging intraoperatively. Adequate needle placement confirmed in multiple planes. Permanent images saved into the patient's record.  Antibiotic Prophylaxis:   Anti-infectives (From admission, onward)   None     Indication(s): None identified  Post-operative Assessment:  Post-procedure Vital Signs:  Pulse/HCG Rate: 8777 Temp: (!) 97.4 F (36.3 C) Resp: 16 BP: (!) 192/86 SpO2: 98 %  EBL: None  Complications: No immediate post-treatment complications observed by team, or reported by patient.  Note: The patient tolerated the entire procedure well. A repeat set of vitals were taken after the procedure and the patient was kept under observation following institutional policy, for this type of procedure. Post-procedural neurological assessment was performed, showing return to baseline, prior to discharge. The patient was provided with post-procedure discharge instructions, including a section on how to identify potential problems. Should any problems arise concerning this procedure, the patient was given instructions to  immediately contact us, at any time, without hesitation. In any case, we plan to contact the patient by telephone for a follow-up status report regarding this interventional procedure.  Comments:  No additional relevant information.  Plan of Care  Orders:  Orders Placed This Encounter  Procedures  . LUMBAR FACET(MEDIAL BRANCH NERVE BLOCK) MBNB    Scheduling Instructions:     Procedure: Lumbar facet block (AKA.: Lumbosacral medial branch nerve block)     Side: Bilateral     Level: L3-4, L4-5, & L5-S1 Facets (L2, L3, L4, L5, & S1 Medial Branch Nerves)     Sedation: Patient's choice.     Timeframe: Today    Order Specific Question:   Where will this procedure be performed?    Answer:   [facility]  . DG [facility] C-ARM 1-60 MIN NO REPORT    Intraoperative interpretation by procedural physician at [place].    Standing Status:   Standing    Number of Occurrences:   1    Order Specific Question:   Reason for exam:    Answer:   Assistance in needle guidance and placement for procedures requiring needle placement in or near specific anatomical locations not easily accessible without such assistance.  . Informed Consent Details: Physician/Practitioner Attestation; Transcribe to consent form and obtain patient signature    Nursing Order: Transcribe to consent form and obtain patient signature. Note: Always confirm laterality of pain with Ms. Walker, before procedure. Procedure: Lumbar Facet Block  under fluoroscopic guidance Indication/Reason: Low Back Pain, with our without leg pain, due to Facet Joint Arthralgia (Joint Pain) known as Lumbar Facet Syndrome, secondary to Lumbar, and/or Lumbosacral Spondylosis (Arthritis of the Spine), without myelopathy or radiculopathy (Nerve Damage). Provider Attestation: I, Al[place]aha Dossie Arbour, MD, (Pain Management Specialist), the physician/practitioner, attest that I have discussed with the patient the benefits, risks, side effects,  alternatives, likelihood of achieving goals and potential problems during recovery for the procedure that I have provided informed consent.  . Provide equipment / supplies at bedside    Equipment required: Single use, disposable, "Block Tray"    Standing Status:   Standing    Number of Occurrences:   1    Order Specific Question:   Specify    Answer:   Block Tray  . Care order/instruction: Please confirm that the patient has stopped the Coumadin (Warfarin) X 5 days prior to procedure or surgery.    Please confirm that the patient has stopped the Coumadin (Warfarin) X 5 days prior to procedure or surgery.    Standing Status:   Standing    Number of Occurrences:   1  . Bleeding precautions    Standing Status:   Standing    Number of Occurrences:   1   Chronic Opioid Analgesic:  None Highest recorded MME/day: 25 mg/day MME/day: 0 mg/day   Medications ordered for procedure: Meds ordered this encounter  Medications  . lidocaine (XYLOCAINE) 2 % (with pres) injection 400 mg  . lactated ringers infusion 1,000 mL  . midazolam (VERSED) 5 MG/5ML injection 1-2 mg    Make sure Flumazenil is available in the pyxis when using this medication. If oversedation occurs, administer 0.2 mg IV over 15 sec. If after 45 sec no response, administer 0.2 mg again over 1 min; may repeat at 1 min intervals; not to exceed 4 doses (1 mg)  . fentaNYL (SUBLIMAZE) injection 25-50 mcg    Make sure Narcan is available in the pyxis when using this medication. In the event of respiratory depression (RR< 8/min): Titrate NARCAN (naloxone) in increments of 0.1 to 0.2 mg IV at 2-3 minute intervals, until desired  degree of reversal.  . ropivacaine (PF) 2 mg/mL (0.2%) (NAROPIN) injection 18 mL  . triamcinolone acetonide (KENALOG-40) injection 80 mg  . glycopyrrolate (ROBINUL) injection 0.2 mg   Medications administered: We administered lidocaine, lactated ringers, midazolam, fentaNYL, ropivacaine (PF) 2 mg/mL (0.2%),  triamcinolone acetonide, and glycopyrrolate.  See the medical record for exact dosing, route, and time of administration.  Follow-up plan:   Return in about 2 weeks (around 08/28/2019) for (VV), (PP).       Interventional treatment options: Planned, scheduled, and/or pending:   None at this time.   Under consideration:  NOTE: COUMADIN ANTICOAGULATION (Stop: 5 days  Restart: 2 hrs)  Diagnostic right-sided vs bilateral lumbar facet block #2    Therapeutic/palliative (PRN):   Diagnostic right lumbar facet block #2      Recent Visits No visits were found meeting these conditions. Showing recent visits within past 90 days and meeting all other requirements Today's Visits Date Type Provider Dept  08/14/19 Procedure visit Milinda Pointer, MD [facility]  Showing today's visits and meeting all other requirements Future Appointments Date Type Provider Dept  08/28/19 Appointment Milinda Pointer, MD [facility]  Showing future appointments within next 90 days and meeting all other requirements  Disposition: Discharge home  Discharge (Date  Time): 08/14/2019; 0939 hrs.   Primary Care Physician: Einar Pheasant, MD Location: [facility] Note by: Gaspar Cola, MD Date: 08/14/2019; Time: 9:56 AM  Disclaimer:  Medicine is not an Chief Strategy Officer. The only guarantee in medicine is that nothing is guaranteed. It is important to note that the decision to proceed with this intervention was based on the information collected from the patient. The Data and conclusions were drawn from the patient's questionnaire, the interview, and the physical examination. Because the information was provided in large part by the patient, it cannot be guaranteed that it has not been purposely or unconsciously manipulated. Every effort has been made to obtain as much relevant data as possible for this evaluation. It is important to note that the conclusions  that lead to this procedure are derived in large part from the available data. Always take into account that the treatment will also be dependent on availability of resources and existing treatment guidelines, considered by other Pain Management Practitioners as being common knowledge and practice, at the time of the intervention. For Medico-Legal purposes, it is also important to point out that variation in procedural techniques and pharmacological choices are the acceptable norm. The indications, contraindications, technique, and results of the above procedure should only be interpreted and judged by a Board-Certified Interventional Pain Specialist with extensive familiarity and expertise in the same exact procedure and technique.

## 2019-08-15 ENCOUNTER — Ambulatory Visit (INDEPENDENT_AMBULATORY_CARE_PROVIDER_SITE_OTHER): Payer: Medicare Other | Admitting: Family Medicine

## 2019-08-15 ENCOUNTER — Telehealth: Payer: Self-pay

## 2019-08-15 DIAGNOSIS — N3941 Urge incontinence: Secondary | ICD-10-CM

## 2019-08-15 NOTE — Telephone Encounter (Signed)
Post procedure phone call. Patient states she is doing good.  

## 2019-08-15 NOTE — Progress Notes (Signed)
Session # Maintenence  Health & Social Factors: No change Caffeine: 2 Alcohol: 0 Daytime voids #per day: 6-7 Night-time voids #per night: 2 Urgency: mild Incontinence Episodes #per day: 0-1 per week on average Ankle used: right Treatment Setting: 19 Feeling/ Response: Toe Flex Comments: no complications noted.  Performed By: Elberta Leatherwood, CMA   Follow Up:   RTC in 1 month

## 2019-08-22 ENCOUNTER — Other Ambulatory Visit: Payer: Self-pay | Admitting: *Deleted

## 2019-08-22 DIAGNOSIS — Z7901 Long term (current) use of anticoagulants: Secondary | ICD-10-CM

## 2019-08-25 ENCOUNTER — Other Ambulatory Visit: Payer: Self-pay

## 2019-08-25 ENCOUNTER — Other Ambulatory Visit (INDEPENDENT_AMBULATORY_CARE_PROVIDER_SITE_OTHER): Payer: Medicare Other

## 2019-08-25 DIAGNOSIS — Z7901 Long term (current) use of anticoagulants: Secondary | ICD-10-CM | POA: Diagnosis not present

## 2019-08-25 LAB — PROTIME-INR
INR: 2.1 ratio — ABNORMAL HIGH (ref 0.8–1.0)
Prothrombin Time: 22.9 s — ABNORMAL HIGH (ref 9.6–13.1)

## 2019-08-27 ENCOUNTER — Encounter: Payer: Self-pay | Admitting: Pain Medicine

## 2019-08-27 NOTE — Progress Notes (Signed)
Patient: Anita Walker  Service Category: E/M  Provider: Gaspar Cola, MD  DOB: 1940/05/17  DOS: 08/28/2019  Location: Office  MRN: [id number]  Setting: Ambulatory outpatient  Referring Provider: Einar Pheasant, MD  Type: Established Patient  Specialty: Interventional Pain Management  PCP: Einar Pheasant, MD  Location: Remote location  Delivery: TeleHealth     Virtual Encounter - Pain Management PROVIDER NOTE: Information contained herein reflects review and annotations entered in association with encounter. Interpretation of such information and data should be left to medically-trained personnel. Information provided to patient can be located elsewhere in the medical record under "Patient Instructions". Document created using STT-dictation technology, any transcriptional errors that may result from process are unintentional.    Contact & Pharmacy Preferred: [phone] Home: [phone] (home) Mobile: [phone] (mobile) E-mail: [email or web address]  [place], [place] [address] [place] [address] [facility] [address] [place] [address] Phone: [phone] Fax: [phone]   Pre-screening  Anita Walker offered "in-person" vs "virtual" encounter. She indicated preferring virtual for this encounter.   Reason COVID-19*  Social distancing based on CDC and AMA recommendations.   I contacted Anita Walker on 08/28/2019 via telephone.      I clearly identified myself as Gaspar Cola, MD. I verified that I was speaking with the correct person using two identifiers (Name: Anita Walker, and date of birth: 15-Apr-1940).  Consent I sought verbal advanced consent from Anita Walker for virtual visit interactions. I informed Anita Walker of possible security and privacy concerns, risks, and limitations associated with providing "not-in-person" medical evaluation and management services. I also informed Anita Walker of the availability of "in-person"  appointments. Finally, I informed her that there would be a charge for the virtual visit and that she could be  personally, fully or partially, financially responsible for it. Anita Walker expressed understanding and agreed to proceed.   Historic Elements   Anita Walker is a 79 y.o. year old, female patient evaluated today after her last contact with our practice on 08/15/2019. Anita Walker  has a past medical history of Anemia, Arthritis, Atrial fibrillation ([place]), Breast cancer ([place]), Diabetes mellitus ([place]), Edema, Gastric ulcer, GERD (gastroesophageal reflux disease), HOH (hard of hearing), Hypercholesterolemia, Hypertension, and Pulmonary emboli ([place]). She also  has a past surgical history that includes Cataract extraction w/PHACO (Right, 12/26/2016); Cataract extraction w/PHACO (Left, 01/23/2017); Breast lumpectomy (03/06/1990); and Colonoscopy with propofol (N/A, 07/08/2019). Anita Walker has a current medication list which includes the following prescription(s): amlodipine, contour test, diphenhydramine, ferrous sulfate, losartan, multivitamin with minerals, pantoprazole, simvastatin, and warfarin. She  reports that she has never smoked. She has never used smokeless tobacco. She reports that she does not drink alcohol and does not use drugs. Anita Walker is allergic to penicillins and penicillin v potassium.   HPI  Today, she is being contacted for a post-procedure assessment.  Today we went over the results of the second diagnostic lumbar facet block and clearly there is a significant contribution from the facet joints to her low back pain.  At this point, I believe it to be medically necessary to proceed with a radiofrequency ablation for the purpose of extending those benefits.  Post-Procedure Evaluation  Procedure: (08/14/2019) diagnostic bilateral lumbar facet block #2 under fluoroscopic guidance and IV sedation Pre-procedure pain level: 6/10 Post-procedure: 0/10 (100% relief)  Sedation: Sedation  provided.  Effectiveness during initial hour after procedure(Ultra-Short Term Relief): 100 %.  Local anesthetic used: Long-acting (4-6 hours) Effectiveness: Defined as any analgesic benefit obtained secondary to the administration of local anesthetics. This carries significant diagnostic value as to the etiological location, or anatomical origin, of the pain. Duration of benefit is expected to coincide with the duration of the local anesthetic used.  Effectiveness during initial 4-6 hours after procedure(Short-Term Relief): 100 %.  Long-term benefit: Defined as any relief past the pharmacologic duration of the local anesthetics.  Effectiveness past the initial 6 hours after procedure(Long-Term Relief): 75 %.  Current benefits: Defined as benefit that persist at this time.   Analgesia:  >75% relief Function: Anita Walker reports improvement in function ROM: Anita Walker reports improvement in ROM   Medical Necessity: Anita Walker has experienced debilitating chronic pain from the Lumbosacral Facet Syndrome (Spondylosis without myelopathy or radiculopathy, lumbosacral region [M47.817]) that has persisted for longer than three months of failed non-surgical care and has either failed to respond, or was unable to tolerate, or simply did not get enough benefit from other more conservative therapies including, but not limited to: 1. Over-the-counter oral analgesic medications (i.e.: ibuprofen, naproxen, etc.) 2. Anti-inflammatory medications 3. Muscle relaxants 4. Membrane stabilizers 5. Opioids 6. Physical therapy (PT), chiropractic manipulation, and/or home exercise program (HEP). 7. Modalities (Heat, ice, etc.) 8. Invasive techniques such as nerve blocks.  Anita Walker has attained greater than 50% reduction in pain from at least two (2) diagnostic medial branch blocks conducted in separate occasions. For this reason, I believe it is medically necessary to proceed with Non-Pulsed Radiofrequency Ablation  for the purpose of attempting to prolong the duration of the benefits seen with the diagnostic injections.  Pharmacotherapy Assessment  Analgesic: None Highest recorded MME/day: 25 mg/day MME/day: 0 mg/day   Monitoring: [place] PMP: PDMP reviewed during this encounter.       Pharmacotherapy: No side-effects or adverse reactions reported. Compliance: No problems identified. Effectiveness: Clinically acceptable. Plan: Refer to "POC".  UDS: No results found for: SUMMARY  Laboratory Chemistry Profile   Renal Lab Results  Component Value Date   BUN 15 05/06/2019   CREATININE 0.87 05/06/2019   GFR 62.47 05/06/2019   GFRAA >60 08/07/2018   GFRNONAA >60 08/07/2018     Hepatic Lab Results  Component Value Date   AST 19 05/06/2019   ALT 15 05/06/2019   ALBUMIN 3.9 05/06/2019   ALKPHOS 58 05/06/2019     Electrolytes Lab Results  Component Value Date   NA 137 05/06/2019   K 4.5 05/06/2019   CL 102 05/06/2019   CALCIUM 9.5 05/06/2019     Bone No results found for: VD25OH, VD125OH2TOT, HD6222LN9, GX2119ER7, 25OHVITD1, 25OHVITD2, 25OHVITD3, TESTOFREE, TESTOSTERONE   Inflammation (CRP: Acute Phase) (ESR: Chronic Phase) Lab Results  Component Value Date   CRP <0.8 10/31/2016   ESRSEDRATE 5 10/31/2016   LATICACIDVEN 1.1 10/30/2016       Note: Above Lab results reviewed.   Imaging  DG PAIN CLINIC C-ARM 1-60 MIN NO REPORT Fluoro was used, but no Radiologist interpretation will be provided.  Please refer to "NOTES" tab for provider progress note.  Assessment  The primary encounter diagnosis was Lumbar facet syndrome (Bilateral) (R>L). 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  Plan of Care  Problem-specific:  No problem-specific Assessment & Plan notes found for this encounter.  Anita Walker has  a current medication list which includes the following long-term medication(s): amlodipine, diphenhydramine, ferrous sulfate, losartan, pantoprazole, simvastatin, and warfarin.  Pharmacotherapy (Medications Ordered): No orders of the defined types were placed in this encounter.  Orders:  Orders Placed This Encounter  Procedures  . Radiofrequency,Lumbar    Standing Status:   Future    Standing Expiration Date:   08/27/2020    Scheduling Instructions:     Side(s): Right-sided     Level: L3-4, L4-5, & L5-S1 Facets (L2, L3, L4, L5, & S1 Medial Branch Nerves)     Sedation: With Sedation.     Scheduling Timeframe: As soon as pre-approved    Order Specific Question:   Where will this procedure be performed?    Answer:   [facility]   Follow-up plan:   Return for RFA (w/ sedation): (R) L-FCT RFA #1, (Blood-thinner Protocol).      Interventional treatment options: Planned, scheduled, and/or pending:   None at this time.   Under consideration:  NOTE: COUMADIN ANTICOAGULATION (Stop: 5 days  Restart: 2 hrs)  Diagnostic right-sided vs bilateral lumbar facet block #2    Therapeutic/palliative (PRN):   Diagnostic right lumbar facet block #2     Recent Visits Date Type Provider Dept  08/14/19 Procedure visit Milinda Pointer, MD [facility]  Showing recent visits within past 90 days and meeting all other requirements Today's Visits Date Type Provider Dept  08/28/19 Telemedicine Milinda Pointer, MD [facility]  Showing today's visits and meeting all other requirements Future Appointments No visits were found meeting these conditions. Showing future appointments within next 90 days and meeting all other requirements  I discussed the assessment and treatment plan with the patient. The patient was provided an opportunity to ask questions and all were answered. The patient agreed with the plan and demonstrated an understanding of the  instructions.  Patient advised to call back or seek an in-person evaluation if the symptoms or condition worsens.  Duration of encounter: 15 minutes.  Note by: Gaspar Cola, MD Date: 08/28/2019; Time: 1:27 PM

## 2019-08-28 ENCOUNTER — Telehealth: Payer: Self-pay

## 2019-08-28 ENCOUNTER — Other Ambulatory Visit: Payer: Self-pay

## 2019-08-28 ENCOUNTER — Ambulatory Visit: Payer: Medicare Other | Attending: Pain Medicine | Admitting: Pain Medicine

## 2019-08-28 DIAGNOSIS — G8929 Other chronic pain: Secondary | ICD-10-CM | POA: Diagnosis not present

## 2019-08-28 DIAGNOSIS — M47816 Spondylosis without myelopathy or radiculopathy, lumbar region: Secondary | ICD-10-CM | POA: Diagnosis not present

## 2019-08-28 DIAGNOSIS — M47817 Spondylosis without myelopathy or radiculopathy, lumbosacral region: Secondary | ICD-10-CM | POA: Diagnosis not present

## 2019-08-28 DIAGNOSIS — M545 Low back pain: Secondary | ICD-10-CM | POA: Diagnosis not present

## 2019-08-28 NOTE — Telephone Encounter (Signed)
Tried calling patient to discharge appt today and schedule RFA No answer no vm set up

## 2019-08-28 NOTE — Patient Instructions (Signed)
____________________________________________________________________________________________  Preparing for Procedure with Sedation  Procedure appointments are limited to planned procedures: . No Prescription Refills. . No disability issues will be discussed. . No medication changes will be discussed.  Instructions: . Oral Intake: Do not eat or drink anything for at least 8 hours prior to your procedure. (Exception: Blood Pressure Medication. See below.) . Transportation: Unless otherwise stated by your physician, you may drive yourself after the procedure. . Blood Pressure Medicine: Do not forget to take your blood pressure medicine with a sip of water the morning of the procedure. If your Diastolic (lower reading)is above 100 mmHg, elective cases will be cancelled/rescheduled. . Blood thinners: These will need to be stopped for procedures. Notify our staff if you are taking any blood thinners. Depending on which one you take, there will be specific instructions on how and when to stop it. . Diabetics on insulin: Notify the staff so that you can be scheduled 1st case in the morning. If your diabetes requires high dose insulin, take only  of your normal insulin dose the morning of the procedure and notify the staff that you have done so. . Preventing infections: Shower with an antibacterial soap the morning of your procedure. . Build-up your immune system: Take 1000 mg of Vitamin C with every meal (3 times a day) the day prior to your procedure. . Antibiotics: Inform the staff if you have a condition or reason that requires you to take antibiotics before dental procedures. . Pregnancy: If you are pregnant, call and cancel the procedure. . Sickness: If you have a cold, fever, or any active infections, call and cancel the procedure. . Arrival: You must be in the facility at least 30 minutes prior to your scheduled procedure. . Children: Do not bring children with you. . Dress appropriately:  Bring dark clothing that you would not mind if they get stained. . Valuables: Do not bring any jewelry or valuables.  Reasons to call and reschedule or cancel your procedure: (Following these recommendations will minimize the risk of a serious complication.) . Surgeries: Avoid having procedures within 2 weeks of any surgery. (Avoid for 2 weeks before or after any surgery). . Flu Shots: Avoid having procedures within 2 weeks of a flu shots or . (Avoid for 2 weeks before or after immunizations). . Barium: Avoid having a procedure within 7-10 days after having had a radiological study involving the use of radiological contrast. (Myelograms, Barium swallow or enema study). . Heart attacks: Avoid any elective procedures or surgeries for the initial 6 months after a "Myocardial Infarction" (Heart Attack). . Blood thinners: It is imperative that you stop these medications before procedures. Let us know if you if you take any blood thinner.  . Infection: Avoid procedures during or within two weeks of an infection (including chest colds or gastrointestinal problems). Symptoms associated with infections include: Localized redness, fever, chills, night sweats or profuse sweating, burning sensation when voiding, cough, congestion, stuffiness, runny nose, sore throat, diarrhea, nausea, vomiting, cold or Flu symptoms, recent or current infections. It is specially important if the infection is over the area that we intend to treat. . Heart and lung problems: Symptoms that may suggest an active cardiopulmonary problem include: cough, chest pain, breathing difficulties or shortness of breath, dizziness, ankle swelling, uncontrolled high or unusually low blood pressure, and/or palpitations. If you are experiencing any of these symptoms, cancel your procedure and contact your primary care physician for an evaluation.  Remember:  Regular Business hours are:    Monday to Thursday 8:00 AM to 4:00 PM  Provider's  Schedule: Mearl Harewood, MD:  Procedure days: Tuesday and Thursday 7:30 AM to 4:00 PM  Bilal Lateef, MD:  Procedure days: Monday and Wednesday 7:30 AM to 4:00 PM ____________________________________________________________________________________________    

## 2019-09-01 ENCOUNTER — Ambulatory Visit: Payer: Self-pay

## 2019-09-10 ENCOUNTER — Telehealth: Payer: Self-pay | Admitting: Internal Medicine

## 2019-09-10 NOTE — Telephone Encounter (Signed)
Patient stated she will be starting her coumadin back 7/15. I have canceled her lab appt for 7/13.

## 2019-09-10 NOTE — Telephone Encounter (Signed)
LMTCB

## 2019-09-10 NOTE — Telephone Encounter (Signed)
Pt is having an injection in her back on 7/15 and needs to stop warfarin on 7/10 and wanted to know what she needed to do with labs

## 2019-09-10 NOTE — Telephone Encounter (Signed)
PT was returning the call

## 2019-09-10 NOTE — Telephone Encounter (Signed)
See other note

## 2019-09-10 NOTE — Telephone Encounter (Signed)
If she is having to stop her coumadin on 09/13/19, then hold on checking pt/inr on 09/16/19.  Will need to call us when she restarts and will need to schedule pt/inr then.

## 2019-09-10 NOTE — Telephone Encounter (Signed)
Do you want to wait until she resumes her warfarin to check her INR? Her lab appt is 7/13

## 2019-09-12 ENCOUNTER — Ambulatory Visit (INDEPENDENT_AMBULATORY_CARE_PROVIDER_SITE_OTHER): Payer: Medicare Other | Admitting: Family Medicine

## 2019-09-12 ENCOUNTER — Other Ambulatory Visit: Payer: Self-pay

## 2019-09-12 DIAGNOSIS — N3941 Urge incontinence: Secondary | ICD-10-CM | POA: Diagnosis not present

## 2019-09-12 NOTE — Progress Notes (Signed)
PTNS  Session # monthly  Health & Social Factors: no change Caffeine: 2 Alcohol: 0 Daytime voids #per day: 6-7 Night-time voids #per night: 2 Urgency: mild Incontinence Episodes #per day: 0-1 per week Ankle used: right Treatment Setting: 19 Feeling/ Response: toe flex Comments: no compaints  Preformed By: Elberta Leatherwood, CMA   Follow Up: 1 month

## 2019-09-15 ENCOUNTER — Encounter: Payer: Self-pay | Admitting: Podiatry

## 2019-09-15 ENCOUNTER — Other Ambulatory Visit: Payer: Self-pay

## 2019-09-15 ENCOUNTER — Ambulatory Visit (INDEPENDENT_AMBULATORY_CARE_PROVIDER_SITE_OTHER): Payer: Medicare Other | Admitting: Podiatry

## 2019-09-15 DIAGNOSIS — D689 Coagulation defect, unspecified: Secondary | ICD-10-CM

## 2019-09-15 DIAGNOSIS — E1142 Type 2 diabetes mellitus with diabetic polyneuropathy: Secondary | ICD-10-CM

## 2019-09-15 DIAGNOSIS — M79674 Pain in right toe(s): Secondary | ICD-10-CM

## 2019-09-15 DIAGNOSIS — B351 Tinea unguium: Secondary | ICD-10-CM

## 2019-09-15 DIAGNOSIS — M79675 Pain in left toe(s): Secondary | ICD-10-CM

## 2019-09-15 NOTE — Progress Notes (Signed)
This patient returns to my office for at risk foot care.  This patient requires this care by a professional since this patient will be at risk due to having type 2 diabetes and coagulation defect   Patient is taking coumadin. This patient is unable to cut nails herself since the patient cannot reach her nails.These nails are painful walking and wearing shoes.  This patient presents for at risk foot care today.  General Appearance  Alert, conversant and in no acute stress.  Vascular  Dorsalis pedis and posterior tibial  pulses are palpable  bilaterally.  Capillary return is within normal limits  bilaterally. Temperature is within normal limits  bilaterally.  Neurologic  Senn-Weinstein monofilament wire test diminished   bilaterally. Muscle power within normal limits bilaterally.  Nails Thick disfigured discolored nails with subungual debris  from hallux to fifth toes bilaterally. No evidence of bacterial infection or drainage bilaterally.  Orthopedic  No limitations of motion  feet .  No crepitus or effusions noted.  No bony pathology or digital deformities noted.  Skin  normotropic skin with no porokeratosis noted bilaterally.  No signs of infections or ulcers noted.     Onychomycosis  Pain in right toes  Pain in left toes  Consent was obtained for treatment procedures.   Mechanical debridement of nails 1-5  bilaterally performed with a nail nipper.  Filed with dremel without incident.    Return office visit    10 weeks                  Told patient to return for periodic foot care and evaluation due to potential at risk complications.   Kassidee Narciso DPM  

## 2019-09-16 ENCOUNTER — Other Ambulatory Visit: Payer: Medicare Other

## 2019-09-17 ENCOUNTER — Ambulatory Visit
Admission: EM | Admit: 2019-09-17 | Discharge: 2019-09-17 | Disposition: A | Discharge status: Home / Self Care | Payer: Medicare Other | Attending: Family Medicine | Admitting: Family Medicine

## 2019-09-17 ENCOUNTER — Other Ambulatory Visit: Payer: Self-pay

## 2019-09-17 DIAGNOSIS — J069 Acute upper respiratory infection, unspecified: Secondary | ICD-10-CM | POA: Diagnosis not present

## 2019-09-17 DIAGNOSIS — R0981 Nasal congestion: Secondary | ICD-10-CM | POA: Diagnosis not present

## 2019-09-17 NOTE — Progress Notes (Signed)
PROVIDER NOTE: Information contained herein reflects review and annotations entered in association with encounter. Interpretation of such information and data should be left to medically-trained personnel. Information provided to patient can be located elsewhere in the medical record under "Patient Instructions". Document created using STT-dictation technology, any transcriptional errors that may result from process are unintentional.    Patient: Anita Walker  Service Category: Procedure  Provider: Gaspar Cola, MD  DOB: 18-Jun-1940  DOS: 09/18/2019  Location: [facility]  MRN: [id number]  Setting: Ambulatory - outpatient  Referring Provider: Einar Pheasant, MD  Type: Established Patient  Specialty: Interventional Pain Management  PCP: Anita Pheasant, MD   Primary Reason for Visit: Interventional Pain Management Treatment. CC: Procedure  Procedure:          Anesthesia, Analgesia, Anxiolysis:  Type: Thermal Lumbar Facet, Medial Branch Radiofrequency Ablation/Neurotomy  #1  Primary Purpose: Therapeutic Region: Posterolateral Lumbosacral Spine Level: L2, L3, L4, L5, & S1 Medial Branch Level(s). These levels will denervate the L3-4, L4-5, and the L5-S1 lumbar facet joints. Laterality: Right  Type: Moderate (Conscious) Sedation combined with Local Anesthesia Indication(s): Analgesia and Anxiety Route: Intravenous (IV) IV Access: Secured Sedation: Meaningful verbal contact was maintained at all times during the procedure  Local Anesthetic: Lidocaine 1-2%  Position: Prone   Indications: 1. Spondylosis without myelopathy or radiculopathy, lumbosacral region   2. Lumbar facet syndrome (Bilateral) (R>L)   3. Lumbar facet arthropathy (Multilevel)   4. DDD (degenerative disc disease), lumbosacral   5. Chronic low back pain (Primary Area of Pain) (Bilateral) (R>L) w/o sciatica    History of vasovagal symptom    Chronic anticoagulation (Coumadin)    Coagulation disorder  (HCC)    Irritability and anger    [name] has been dealing with the above chronic pain for longer than three months and has either failed to respond, was unable to tolerate, or simply did not get enough benefit from other more conservative therapies including, but not limited to: 1. Over-the-counter medications 2. Anti-inflammatory medications 3. Muscle relaxants 4. Membrane stabilizers 5. Opioids 6. Physical therapy and/or chiropractic manipulation 7. Modalities (Heat, ice, etc.) 8. Invasive techniques such as nerve blocks. [name] has attained more than 50% relief of the pain from a series of diagnostic injections conducted in separate occasions.  Pain Score: Pre-procedure: 5 /10 Post-procedure: 0-No pain/10  Pre-op Assessment:  [name] is a 79 y.o. (year old), female patient, seen today for interventional treatment. She  has a past surgical history that includes Cataract extraction w/PHACO (Right, 12/26/2016); Cataract extraction w/PHACO (Left, 01/23/2017); Breast lumpectomy (03/06/1990); and Colonoscopy with propofol (N/A, 07/08/2019). [name] has a current medication list which includes the following prescription(s): amlodipine, contour test, diphenhydramine, ferrous sulfate, hydrocodone-acetaminophen, [START ON 09/25/2019] hydrocodone-acetaminophen, losartan, multivitamin with minerals, pantoprazole, simvastatin, and warfarin, and the following Facility-Administered Medications: fentanyl and midazolam. Her primarily concern today is the Procedure  Initial Vital Signs:  Pulse/HCG Rate: 73ECG Heart Rate: 79 Temp: (!) 97.3 F (36.3 C) Resp: 18 BP: (!) 148/70 SpO2: 98 %  BMI: Estimated body mass index is 37.71 kg/m as calculated from the following:   Height as of this encounter: 5\' 8"  (1.727 m).   Weight as of this encounter: 248 lb (112.5 kg).  Risk Assessment: Allergies: Reviewed. She is allergic to penicillins and penicillin v potassium.  Allergy Precautions: None  required Coagulopathies: Reviewed. None identified.  Blood-thinner therapy: None at this time Active Infection(s): Reviewed. None identified. [name] is afebrile  Site Confirmation: [name] was asked to confirm the procedure and laterality before marking the site Procedure checklist: Completed Consent: Before the procedure and under the influence of no sedative(s), amnesic(s), or anxiolytics, the patient was informed of the treatment options, risks and possible complications. To fulfill our ethical and legal obligations, as recommended by the American Medical Association's Code of Ethics, I have informed the patient of my clinical impression; the nature and purpose of the treatment or procedure; the risks, benefits, and possible complications of the intervention; the alternatives, including doing nothing; the risk(s) and benefit(s) of the alternative treatment(s) or procedure(s); and the risk(s) and benefit(s) of doing nothing. The patient was provided information about the general risks and possible complications associated with the procedure. These may include, but are not limited to: failure to achieve desired goals, infection, bleeding, organ or nerve damage, allergic reactions, paralysis, and death. In addition, the patient was informed of those risks and complications associated to Spine-related procedures, such as failure to decrease pain; infection (i.e.: Meningitis, epidural or intraspinal abscess); bleeding (i.e.: epidural hematoma, subarachnoid hemorrhage, or any other type of intraspinal or peri-dural bleeding); organ or nerve damage (i.e.: Any type of peripheral nerve, nerve root, or spinal cord injury) with subsequent damage to sensory, motor, and/or autonomic systems, resulting in permanent pain, numbness, and/or weakness of one or several areas of the body; allergic reactions; (i.e.: anaphylactic reaction); and/or death. Furthermore, the patient was informed of those risks and  complications associated with the medications. These include, but are not limited to: allergic reactions (i.e.: anaphylactic or anaphylactoid reaction(s)); adrenal axis suppression; blood sugar elevation that in diabetics may result in ketoacidosis or comma; water retention that in patients with history of congestive heart failure may result in shortness of breath, pulmonary edema, and decompensation with resultant heart failure; weight gain; swelling or edema; medication-induced neural toxicity; particulate matter embolism and blood vessel occlusion with resultant organ, and/or nervous system infarction; and/or aseptic necrosis of one or more joints. Finally, the patient was informed that Medicine is not an exact science; therefore, there is also the possibility of unforeseen or unpredictable risks and/or possible complications that may result in a catastrophic outcome. The patient indicated having understood very clearly. We have given the patient no guarantees and we have made no promises. Enough time was given to the patient to ask questions, all of which were answered to the patient's satisfaction. [name] has indicated that she wanted to continue with the procedure. Attestation: I, the ordering provider, attest that I have discussed with the patient the benefits, risks, side-effects, alternatives, likelihood of achieving goals, and potential problems during recovery for the procedure that I have provided informed consent. Date  Time: 09/18/2019 10:21 AM  Pre-Procedure Preparation:  Monitoring: As per clinic protocol. Respiration, ETCO2, SpO2, BP, heart rate and rhythm monitor placed and checked for adequate function Safety Precautions: Patient was assessed for positional comfort and pressure points before starting the procedure. Time-out: I initiated and conducted the "Time-out" before starting the procedure, as per protocol. The patient was asked to participate by confirming the accuracy of the  "Time Out" information. Verification of the correct person, site, and procedure were performed and confirmed by me, the nursing staff, and the patient. "Time-out" conducted as per Joint Commission's Universal Protocol (UP.01.01.01). Time: 1101  Description of Procedure:          Laterality: Right Levels:  L2, L3, L4, L5, & S1 Medial Branch Level(s), at the L3-4, L4-5, and the L5-S1  lumbar facet joints. Area Prepped: Lumbosacral DuraPrep (Iodine Povacrylex [0.7% available iodine] and Isopropyl Alcohol, 74% w/w) Safety Precautions: Aspiration looking for blood return was conducted prior to all injections. At no point did we inject any substances, as a needle was being advanced. Before injecting, the patient was told to immediately notify me if she was experiencing any new onset of "ringing in the ears, or metallic taste in the mouth". No attempts were made at seeking any paresthesias. Safe injection practices and needle disposal techniques used. Medications properly checked for expiration dates. SDV (single dose vial) medications used. After the completion of the procedure, all disposable equipment used was discarded in the proper designated medical waste containers. Local Anesthesia: Protocol guidelines were followed. The patient was positioned over the fluoroscopy table. The area was prepped in the usual manner. The time-out was completed. The target area was identified using fluoroscopy. A 12-in long, straight, sterile hemostat was used with fluoroscopic guidance to locate the targets for each level blocked. Once located, the skin was marked with an approved surgical skin marker. Once all sites were marked, the skin (epidermis, dermis, and hypodermis), as well as deeper tissues (fat, connective tissue and muscle) were infiltrated with a small amount of a short-acting local anesthetic, loaded on a 10cc syringe with a 25G, 1.5-in  Needle. An appropriate amount of time was allowed for local anesthetics to  take effect before proceeding to the next step. Local Anesthetic: Lidocaine 2.0% The unused portion of the local anesthetic was discarded in the proper designated containers. Technical explanation of process:  Radiofrequency Ablation (RFA) L2 Medial Branch Nerve RFA: The target area for the L2 medial branch is at the junction of the postero-lateral aspect of the superior articular process and the superior, posterior, and medial edge of the transverse process of L3. Under fluoroscopic guidance, a Radiofrequency needle was inserted until contact was made with os over the superior postero-lateral aspect of the pedicular shadow (target area). Sensory and motor testing was conducted to properly adjust the position of the needle. Once satisfactory placement of the needle was achieved, the numbing solution was slowly injected after negative aspiration for blood. 2.0 mL of the nerve block solution was injected without difficulty or complication. After waiting for at least 3 minutes, the ablation was performed. Once completed, the needle was removed intact. L3 Medial Branch Nerve RFA: The target area for the L3 medial branch is at the junction of the postero-lateral aspect of the superior articular process and the superior, posterior, and medial edge of the transverse process of L4. Under fluoroscopic guidance, a Radiofrequency needle was inserted until contact was made with os over the superior postero-lateral aspect of the pedicular shadow (target area). Sensory and motor testing was conducted to properly adjust the position of the needle. Once satisfactory placement of the needle was achieved, the numbing solution was slowly injected after negative aspiration for blood. 2.0 mL of the nerve block solution was injected without difficulty or complication. After waiting for at least 3 minutes, the ablation was performed. Once completed, the needle was removed intact. L4 Medial Branch Nerve RFA: The target area for the  L4 medial branch is at the junction of the postero-lateral aspect of the superior articular process and the superior, posterior, and medial edge of the transverse process of L5. Under fluoroscopic guidance, a Radiofrequency needle was inserted until contact was made with os over the superior postero-lateral aspect of the pedicular shadow (target area). Sensory and motor testing was conducted to  properly adjust the position of the needle. Once satisfactory placement of the needle was achieved, the numbing solution was slowly injected after negative aspiration for blood. 2.0 mL of the nerve block solution was injected without difficulty or complication. After waiting for at least 3 minutes, the ablation was performed. Once completed, the needle was removed intact. L5 Medial Branch Nerve RFA: The target area for the L5 medial branch is at the junction of the postero-lateral aspect of the superior articular process of S1 and the superior, posterior, and medial edge of the sacral ala. Under fluoroscopic guidance, a Radiofrequency needle was inserted until contact was made with os over the superior postero-lateral aspect of the pedicular shadow (target area). Sensory and motor testing was conducted to properly adjust the position of the needle. Once satisfactory placement of the needle was achieved, the numbing solution was slowly injected after negative aspiration for blood. 2.0 mL of the nerve block solution was injected without difficulty or complication. After waiting for at least 3 minutes, the ablation was performed. Once completed, the needle was removed intact. S1 Medial Branch Nerve RFA: The target area for the S1 medial branch is located inferior to the junction of the S1 superior articular process and the L5 inferior articular process, posterior, inferior, and lateral to the 6 o'clock position of the L5-S1 facet joint, just superior to the S1 posterior foramen. Under fluoroscopic guidance, the Radiofrequency  needle was advanced until contact was made with os over the Target area. Sensory and motor testing was conducted to properly adjust the position of the needle. Once satisfactory placement of the needle was achieved, the numbing solution was slowly injected after negative aspiration for blood. 2.0 mL of the nerve block solution was injected without difficulty or complication. After waiting for at least 3 minutes, the ablation was performed. Once completed, the needle was removed intact. Radiofrequency lesioning (ablation):  Radiofrequency Generator: NeuroTherm [id number] Sensory Stimulation Parameters: 50 Hz was used to locate & identify the nerve, making sure that the needle was positioned such that there was no sensory stimulation below 0.3 V or above 0.7 V. Motor Stimulation Parameters: 2 Hz was used to evaluate the motor component. Care was taken not to lesion any nerves that demonstrated motor stimulation of the lower extremities at an output of less than 2.5 times that of the sensory threshold, or a maximum of 2.0 V. Lesioning Technique Parameters: Standard Radiofrequency settings. (Not bipolar or pulsed.) Temperature Settings: 80 degrees C Lesioning time: 60 seconds Intra-operative Compliance: Compliant Materials & Medications: Needle(s) (Electrode/Cannula) Type: Teflon-coated, curved tip, Radiofrequency needle(s) Gauge: 20G Length: 15cm Numbing solution: 0.2% PF-Ropivacaine + Triamcinolone (40 mg/mL) diluted to a final concentration of 4 mg of Triamcinolone/mL of Ropivacaine The unused portion of the solution was discarded in the proper designated containers.  Once the entire procedure was completed, the treated area was cleaned, making sure to leave some of the prepping solution back to take advantage of its long term bactericidal properties.  Illustration of the posterior view of the lumbar spine and the posterior neural structures. Laminae of L2 through S1 are labeled. DPRL5, dorsal primary  ramus of L5; DPRS1, dorsal primary ramus of S1; DPR3, dorsal primary ramus of L3; FJ, facet (zygapophyseal) joint L3-L4; I, inferior articular process of L4; LB1, lateral branch of dorsal primary ramus of L1; IAB, inferior articular branches from L3 medial branch (supplies L4-L5 facet joint); IBP, intermediate branch plexus; MB3, medial branch of dorsal primary ramus of L3; NR3, third lumbar nerve  root; S, superior articular process of L5; SAB, superior articular branches from L4 (supplies L4-5 facet joint also); TP3, transverse process of L3.  Vitals:   09/18/19 1140 09/18/19 1145 09/18/19 1155 09/18/19 1205  BP: (!) 158/90 (!) 152/82 137/62 (!) 145/80  Pulse: 82     Resp: 13 14 16 16   Temp:      TempSrc:      SpO2: 97% 94% 91% 99%  Weight:      Height:       Start Time: 1101 hrs. End Time:   hrs.  Imaging Guidance (Spinal):          Type of Imaging Technique: Fluoroscopy Guidance (Spinal) Indication(s): Assistance in needle guidance and placement for procedures requiring needle placement in or near specific anatomical locations not easily accessible without such assistance. Exposure Time: Please see nurses notes. Contrast: None used. Fluoroscopic Guidance: I was personally present during the use of fluoroscopy. "Tunnel Vision Technique" used to obtain the best possible view of the target area. Parallax error corrected before commencing the procedure. "Direction-depth-direction" technique used to introduce the needle under continuous pulsed fluoroscopy. Once target was reached, antero-posterior, oblique, and lateral fluoroscopic projection used confirm needle placement in all planes. Images permanently stored in EMR. Interpretation: No contrast injected. I personally interpreted the imaging intraoperatively. Adequate needle placement confirmed in multiple planes. Permanent images saved into the patient's record.  Antibiotic Prophylaxis:   Anti-infectives (From admission, onward)   None      Indication(s): None identified  Post-operative Assessment:  Post-procedure Vital Signs:  Pulse/HCG Rate: 8278 Temp: (!) 97.3 F (36.3 C) Resp: 16 BP: (!) 145/80 SpO2: 99 %  EBL: None  Complications: No immediate post-treatment complications observed by team, or reported by patient.  Note: The patient tolerated the entire procedure well. A repeat set of vitals were taken after the procedure and the patient was kept under observation following institutional policy, for this type of procedure. Post-procedural neurological assessment was performed, showing return to baseline, prior to discharge. The patient was provided with post-procedure discharge instructions, including a section on how to identify potential problems. Should any problems arise concerning this procedure, the patient was given instructions to immediately contact us, at any time, without hesitation. In any case, we plan to contact the patient by telephone for a follow-up status report regarding this interventional procedure.  Comments:  No additional relevant information.  Plan of Care  Orders:  Orders Placed This Encounter  Procedures  . Radiofrequency,Lumbar    Scheduling Instructions:     Side(s): Right-sided     Level: L3-4, L4-5, & L5-S1 Facets (L2, L3, L4, L5, & S1 Medial Branch Nerves)     Sedation: With Sedation.     Timeframe: Today    Order Specific Question:   Where will this procedure be performed?    Answer:   [facility]  . [facility] C-ARM 1-60 MIN NO REPORT    Intraoperative interpretation by procedural physician at [place].    Standing Status:   Standing    Number of Occurrences:   1    Order Specific Question:   Reason for exam:    Answer:   Assistance in needle guidance and placement for procedures requiring needle placement in or near specific anatomical locations not easily accessible without such assistance.  . Care order/instruction: Please confirm that the  patient has stopped the Coumadin (Warfarin) X 5 days prior to procedure or surgery.    Please confirm  that the patient has stopped the Coumadin (Warfarin) X 5 days prior to procedure or surgery.    Standing Status:   Standing    Number of Occurrences:   1  . Informed Consent Details: Physician/Practitioner Attestation; Transcribe to consent form and obtain patient signature    Nursing Order: Transcribe to consent form and obtain patient signature. Note: Always confirm laterality of pain with [name], before procedure. Procedure: Lumbar Facet Radiofrequency Ablation Indication/Reason: Low Back Pain, with our without leg pain, due to Facet Joint Arthralgia (Joint Pain) known as Lumbar Facet Syndrome, secondary to Lumbar, and/or Lumbosacral Spondylosis (Arthritis of the Spine), without myelopathy or radiculopathy (Nerve Damage). Provider Attestation: I, [place] Dossie Arbour, MD, (Pain Management Specialist), the physician/practitioner, attest that I have discussed with the patient the benefits, risks, side effects, alternatives, likelihood of achieving goals and potential problems during recovery for the procedure that I have provided informed consent.  . Provide equipment / supplies at bedside    Equipment required: Sterile "Radiofrequency Tray"; Large hemostat (1); Small hemostat (1); Towels (6-8); 4x4 sterile sponge pack (1) Radiofrequency Needle(s): Size: Long Quantity: 5    Standing Status:   Standing    Number of Occurrences:   1    Order Specific Question:   Specify    Answer:   Radiofrequency Tray  . Bleeding precautions    Standing Status:   Standing    Number of Occurrences:   1   Chronic Opioid Analgesic:  None Highest recorded MME/day: 25 mg/day MME/day: 0 mg/day   Medications ordered for procedure: Meds ordered this encounter  Medications  . lidocaine (XYLOCAINE) 2 % (with pres) injection 400 mg  . lactated ringers infusion 1,000 mL  . midazolam (VERSED) 5 MG/5ML  injection 1-2 mg    Make sure Flumazenil is available in the pyxis when using this medication. If oversedation occurs, administer 0.2 mg IV over 15 sec. If after 45 sec no response, administer 0.2 mg again over 1 min; may repeat at 1 min intervals; not to exceed 4 doses (1 mg)  . fentaNYL (SUBLIMAZE) injection 25-50 mcg    Make sure Narcan is available in the pyxis when using this medication. In the event of respiratory depression (RR< 8/min): Titrate NARCAN (naloxone) in increments of 0.1 to 0.2 mg IV at 2-3 minute intervals, until desired degree of reversal.  . glycopyrrolate (ROBINUL) injection 0.2 mg  . triamcinolone acetonide (KENALOG-40) injection 40 mg  . ropivacaine (PF) 2 mg/mL (0.2%) (NAROPIN) injection 9 mL  . HYDROcodone-acetaminophen (NORCO/VICODIN) 5-325 MG tablet    Sig: Take 1 tablet by mouth every 6 (six) hours as needed for up to 7 days for severe pain. Must last 7 days.    Dispense:  28 tablet    Refill:  0    For acute post-operative pain. Not to be refilled. Must last 7 days.  Marland Kitchen HYDROcodone-acetaminophen (NORCO/VICODIN) 5-325 MG tablet    Sig: Take 1 tablet by mouth every 6 (six) hours as needed for up to 7 days for severe pain. Must last 7 days.    Dispense:  28 tablet    Refill:  0    For acute post-operative pain. Not to be refilled.  Must last 7 days.  . ondansetron (ZOFRAN) injection 4 mg   Medications administered: We administered lidocaine, lactated ringers, midazolam, glycopyrrolate, triamcinolone acetonide, ropivacaine (PF) 2 mg/mL (0.2%), and ondansetron.  See the medical record for exact dosing, route, and time of administration.  Follow-up plan:  Return in about 2 weeks (around 10/02/2019) for VV(15-min), (PP), eval day (PM).       Interventional treatment options: Planned, scheduled, and/or pending:   None at this time.   Under consideration:  NOTE: COUMADIN ANTICOAGULATION (Stop: 5 days  Restart: 2 hrs)  Diagnostic right-sided vs bilateral lumbar  facet block #2    Therapeutic/palliative (PRN):   Diagnostic right lumbar facet block #2      Recent Visits Date Type Provider Dept  08/28/19 Telemedicine Milinda Pointer, MD [facility]  08/14/19 Procedure visit Milinda Pointer, MD [facility]  Showing recent visits within past 90 days and meeting all other requirements Today's Visits Date Type Provider Dept  09/18/19 Procedure visit Milinda Pointer, MD [facility]  Showing today's visits and meeting all other requirements Future Appointments Date Type Provider Dept  10/06/19 Appointment Milinda Pointer, MD [facility]  Showing future appointments within next 90 days and meeting all other requirements  Disposition: Discharge home  Discharge (Date  Time): 09/18/2019; 1210 hrs.   Primary Care Physician: Anita Pheasant, MD Location: [facility] Note by: Anita Cola, MD Date: 09/18/2019; Time: 1:29 PM  Disclaimer:  Medicine is not an Chief Strategy Officer. The only guarantee in medicine is that nothing is guaranteed. It is important to note that the decision to proceed with this intervention was based on the information collected from the patient. The Data and conclusions were drawn from the patient's questionnaire, the interview, and the physical examination. Because the information was provided in large part by the patient, it cannot be guaranteed that it has not been purposely or unconsciously manipulated. Every effort has been made to obtain as much relevant data as possible for this evaluation. It is important to note that the conclusions that lead to this procedure are derived in large part from the available data. Always take into account that the treatment will also be dependent on availability of resources and existing treatment guidelines, considered by other Pain Management Practitioners as being common knowledge and practice, at the time of the  intervention. For Medico-Legal purposes, it is also important to point out that variation in procedural techniques and pharmacological choices are the acceptable norm. The indications, contraindications, technique, and results of the above procedure should only be interpreted and judged by a Board-Certified Interventional Pain Specialist with extensive familiarity and expertise in the same exact procedure and technique.

## 2019-09-17 NOTE — Discharge Instructions (Signed)
Flonase nasal spray °

## 2019-09-17 NOTE — ED Triage Notes (Signed)
Patient states that started having nasal congestion and slight cough that started yesterday. States that when she woke up this morning she didn't feel good and feels like she needs to be looked at. States that she has had both covid vaccines.

## 2019-09-18 ENCOUNTER — Ambulatory Visit
Admission: RE | Admit: 2019-09-18 | Discharge: 2019-09-18 | Disposition: A | Discharge status: Home / Self Care | Payer: Medicare Other | Source: Ambulatory Visit | Attending: Pain Medicine | Admitting: Pain Medicine

## 2019-09-18 ENCOUNTER — Encounter: Payer: Self-pay | Admitting: Pain Medicine

## 2019-09-18 ENCOUNTER — Ambulatory Visit: Payer: Medicare Other | Admitting: Internal Medicine

## 2019-09-18 ENCOUNTER — Ambulatory Visit (HOSPITAL_BASED_OUTPATIENT_CLINIC_OR_DEPARTMENT_OTHER): Payer: Medicare Other | Admitting: Pain Medicine

## 2019-09-18 VITALS — BP 145/80 | HR 82 | Temp 97.3°F | Resp 16 | Ht 68.0 in | Wt 248.0 lb

## 2019-09-18 DIAGNOSIS — G8918 Other acute postprocedural pain: Secondary | ICD-10-CM

## 2019-09-18 DIAGNOSIS — G8929 Other chronic pain: Secondary | ICD-10-CM | POA: Insufficient documentation

## 2019-09-18 DIAGNOSIS — R110 Nausea: Secondary | ICD-10-CM | POA: Insufficient documentation

## 2019-09-18 DIAGNOSIS — M47817 Spondylosis without myelopathy or radiculopathy, lumbosacral region: Secondary | ICD-10-CM | POA: Diagnosis not present

## 2019-09-18 DIAGNOSIS — M5137 Other intervertebral disc degeneration, lumbosacral region: Secondary | ICD-10-CM | POA: Insufficient documentation

## 2019-09-18 DIAGNOSIS — M51379 Other intervertebral disc degeneration, lumbosacral region without mention of lumbar back pain or lower extremity pain: Secondary | ICD-10-CM

## 2019-09-18 DIAGNOSIS — Z7901 Long term (current) use of anticoagulants: Secondary | ICD-10-CM | POA: Diagnosis not present

## 2019-09-18 DIAGNOSIS — M47816 Spondylosis without myelopathy or radiculopathy, lumbar region: Secondary | ICD-10-CM

## 2019-09-18 DIAGNOSIS — M545 Low back pain, unspecified: Secondary | ICD-10-CM

## 2019-09-18 DIAGNOSIS — R454 Irritability and anger: Secondary | ICD-10-CM | POA: Insufficient documentation

## 2019-09-18 DIAGNOSIS — R55 Syncope and collapse: Secondary | ICD-10-CM

## 2019-09-18 DIAGNOSIS — D689 Coagulation defect, unspecified: Secondary | ICD-10-CM | POA: Insufficient documentation

## 2019-09-18 MED ORDER — LIDOCAINE HCL 2 % IJ SOLN
INTRAMUSCULAR | Status: AC
  Filled 2019-09-18: qty 10

## 2019-09-18 MED ORDER — ONDANSETRON HCL 4 MG/2ML IJ SOLN
INTRAMUSCULAR | Status: AC
  Filled 2019-09-18: qty 2

## 2019-09-18 MED ORDER — LIDOCAINE HCL 2 % IJ SOLN
20.0000 mL | Freq: Once | INTRAMUSCULAR | Status: AC
  Administered 2019-09-18: 400 mg

## 2019-09-18 MED ORDER — HYDROCODONE-ACETAMINOPHEN 5-325 MG PO TABS: 1.0000 | ORAL_TABLET | Freq: Four times a day (QID) | ORAL | 0 refills | Status: AC | PRN

## 2019-09-18 MED ORDER — ONDANSETRON HCL 4 MG/2ML IJ SOLN
4.0000 mg | Freq: Once | INTRAMUSCULAR | Status: AC
  Administered 2019-09-18: 4 mg via INTRAVENOUS

## 2019-09-18 MED ORDER — TRIAMCINOLONE ACETONIDE 40 MG/ML IJ SUSP
40.0000 mg | Freq: Once | INTRAMUSCULAR | Status: AC
  Administered 2019-09-18: 40 mg

## 2019-09-18 MED ORDER — ROPIVACAINE HCL 2 MG/ML IJ SOLN
9.0000 mL | Freq: Once | INTRAMUSCULAR | Status: AC
  Administered 2019-09-18: 9 mL via PERINEURAL

## 2019-09-18 MED ORDER — MIDAZOLAM HCL 5 MG/5ML IJ SOLN
INTRAMUSCULAR | Status: AC
  Filled 2019-09-18: qty 5

## 2019-09-18 MED ORDER — ROPIVACAINE HCL 2 MG/ML IJ SOLN
INTRAMUSCULAR | Status: AC
  Filled 2019-09-18: qty 10

## 2019-09-18 MED ORDER — GLYCOPYRROLATE 0.2 MG/ML IJ SOLN
0.2000 mg | Freq: Once | INTRAMUSCULAR | Status: AC
  Administered 2019-09-18: 0.2 mg via INTRAVENOUS

## 2019-09-18 MED ORDER — FENTANYL CITRATE (PF) 100 MCG/2ML IJ SOLN: 25.0000 ug | INTRAMUSCULAR | Status: DC | PRN

## 2019-09-18 MED ORDER — TRIAMCINOLONE ACETONIDE 40 MG/ML IJ SUSP
INTRAMUSCULAR | Status: AC
  Filled 2019-09-18: qty 1

## 2019-09-18 MED ORDER — GLYCOPYRROLATE 0.2 MG/ML IJ SOLN
INTRAMUSCULAR | Status: AC
  Filled 2019-09-18: qty 1

## 2019-09-18 MED ORDER — LACTATED RINGERS IV SOLN
1000.0000 mL | Freq: Once | INTRAVENOUS | Status: AC
  Administered 2019-09-18: 1000 mL via INTRAVENOUS

## 2019-09-18 MED ORDER — FENTANYL CITRATE (PF) 100 MCG/2ML IJ SOLN
INTRAMUSCULAR | Status: AC
  Filled 2019-09-18: qty 2

## 2019-09-18 MED ORDER — MIDAZOLAM HCL 5 MG/5ML IJ SOLN
1.0000 mg | INTRAMUSCULAR | Status: DC | PRN
  Administered 2019-09-18: 1 mg via INTRAVENOUS

## 2019-09-18 NOTE — Patient Instructions (Addendum)
___________________________________________________________________________________________  Post-Radiofrequency (RF) Discharge Instructions  You have just completed a Radiofrequency Neurotomy.  The following instructions will provide you with information and guidelines for self-care upon discharge.  If at any time you have questions or concerns please call your physician. DO NOT DRIVE YOURSELF!!  Instructions:  Apply ice: Fill a plastic sandwich bag with crushed ice. Cover it with a small towel and apply to injection site. Apply for 15 minutes then remove x 15 minutes. Repeat sequence on day of procedure, until you go to bed. The purpose is to minimize swelling and discomfort after procedure.  Apply heat: Apply heat to procedure site starting the day following the procedure. The purpose is to treat any soreness and discomfort from the procedure.  Food intake: No eating limitations, unless stipulated above.  Nevertheless, if you have had sedation, you may experience some nausea.  In this case, it may be wise to wait at least two hours prior to resuming regular diet.  Physical activities: Keep activities to a minimum for the first 8 hours after the procedure. For the first 24 hours after the procedure, do not drive a motor vehicle,  Operate heavy machinery, power tools, or handle any weapons.  Consider walking with the use of an assistive device or accompanied by an adult for the first 24 hours.  Do not drink alcoholic beverages including beer.  Do not make any important decisions or sign any legal documents. Go home and rest today.  Resume activities tomorrow, as tolerated.  Use caution in moving about as you may experience mild leg weakness.  Use caution in cooking, use of household electrical appliances and climbing steps.  Driving: If you have received any sedation, you are not allowed to drive for 24 hours after your procedure.  Blood thinner: Restart your blood thinner 6 hours after your  procedure. (Only for those taking blood thinners)  Insulin: As soon as you can eat, you may resume your normal dosing schedule. (Only for those taking insulin)  Medications: May resume pre-procedure medications.  Do not take any drugs, other than what has been prescribed to you.  Infection prevention: Keep procedure site clean and dry.  Post-procedure Pain Diary: Extremely important that this be done correctly and accurately. Recorded information will be used to determine the next step in treatment.  Pain evaluated is that of treated area only. Do not include pain from an untreated area.  Complete every hour, on the hour, for the initial 8 hours. Set an alarm to help you do this part accurately.  Do not go to sleep and have it completed later. It will not be accurate.  Follow-up appointment: Keep your follow-up appointment after the procedure. Usually 2-6 weeks after radiofrequency. Bring you pain diary. The information collected will be essential for your long-term care.   Expect:  From numbing medicine (AKA: Local Anesthetics): Numbness or decrease in pain.  Onset: Full effect within 15 minutes of injected.  Duration: It will depend on the type of local anesthetic used. On the average, 1 to 8 hours.   From steroids (when added): Decrease in swelling or inflammation. Once inflammation is improved, relief of the pain will follow.  Onset of benefits: Depends on the amount of swelling present. The more swelling, the longer it will take for the benefits to be seen. In some cases, up to 10 days.  Duration: Steroids will stay in the system x 2 weeks. Duration of benefits will depend on multiple posibilities including persistent irritating factors.    From procedure: Some discomfort is to be expected once the numbing medicine wears off. In the case of radiofrequency procedures, this may last as long as 6 weeks. Additional post-procedure pain medication is provided for this. Discomfort is  minimized if ice and heat are applied as instructed.  Call if:  You experience numbness and weakness that gets worse with time, as opposed to wearing off.  He experience any unusual bleeding, difficulty breathing, or loss of the ability to control your bowel and bladder. (This applies to Spinal procedures only)  You experience any redness, swelling, heat, red streaks, elevated temperature, fever, or any other signs of a possible infection.  Emergency Numbers:  [place] hours (Monday - Thursday, 8:00 AM - 4:00 PM) (Friday, 9:00 AM - 12:00 Noon): [phone]  After hours: [phone] ____________________________________________________________________________________________   ____________________________________________________________________________________________  Blood Thinners  IMPORTANT NOTICE:  If you take any of these, make sure to notify the nursing staff.  Failure to do so may result in injury.  Recommended time intervals to stop and restart blood-thinners, before & after invasive procedures  Generic Name Brand Name Stop Time. Must be stopped at least this long before procedures. After procedures, wait at least this long before re-starting.  Abciximab Reopro 15 days 2 hrs  Alteplase Activase 10 days 10 days  Anagrelide Agrylin    Apixaban Eliquis 3 days 6 hrs  Cilostazol Pletal 3 days 5 hrs  Clopidogrel Plavix 7-10 days 2 hrs  Dabigatran Pradaxa 5 days 6 hrs  Dalteparin Fragmin 24 hours 4 hrs  Dipyridamole Aggrenox 11days 2 hrs  Edoxaban Lixiana; Savaysa 3 days 2 hrs  Enoxaparin  Lovenox 24 hours 4 hrs  Eptifibatide Integrillin 8 hours 2 hrs  Fondaparinux  Arixtra 72 hours 12 hrs  Prasugrel Effient 7-10 days 6 hrs  Reteplase Retavase 10 days 10 days  Rivaroxaban Xarelto 3 days 6 hrs  Ticagrelor Brilinta 5-7 days 6 hrs  Ticlopidine Ticlid 10-14 days 2 hrs  Tinzaparin Innohep 24 hours 4 hrs  Tirofiban Aggrastat 8 hours 2 hrs  Warfarin Coumadin 5  days 2 hrs   Other medications with blood-thinning effects  Product indications Generic (Brand) names Note  Cholesterol Lipitor Stop 4 days before procedure  Blood thinner (injectable) Heparin (LMW or LMWH Heparin) Stop 24 hours before procedure  Cancer Ibrutinib (Imbruvica) Stop 7 days before procedure  Malaria/Rheumatoid Hydroxychloroquine (Plaquenil) Stop 11 days before procedure  Thrombolytics  10 days before or after procedures   Over-the-counter (OTC) Products with blood-thinning effects  Product Common names Stop Time  Aspirin > 325 mg Goody Powders, Excedrin, etc. 11 days  Aspirin ? 81 mg  7 days  Fish oil  4 days  Garlic supplements  7 days  Ginkgo biloba  36 hours  Ginseng  24 hours  NSAIDs Ibuprofen, Naprosyn, etc. 3 days  Vitamin E  4 days   ____________________________________________________________________________________________ Dennis Bast may take your Coumadin at 2pm today.

## 2019-09-19 ENCOUNTER — Telehealth: Payer: Self-pay

## 2019-09-19 ENCOUNTER — Ambulatory Visit: Payer: Self-pay

## 2019-09-19 DIAGNOSIS — Z7901 Long term (current) use of anticoagulants: Secondary | ICD-10-CM

## 2019-09-19 NOTE — Telephone Encounter (Signed)
Post procedure phone call.  Patient states she is doing well.  

## 2019-09-19 NOTE — Telephone Encounter (Signed)
Patient started back on her coumadin yesterday afternoon after having her back injection. How many days out do you want me to schedule her INR to be drawn?

## 2019-09-20 NOTE — Telephone Encounter (Signed)
Schedule pt/inr in 10 days.

## 2019-09-21 NOTE — ED Provider Notes (Signed)
[facility]    CSN: [id number] Arrival date & time: 09/17/19  3614      History   Chief Complaint Chief Complaint  Patient presents with  . Nasal Congestion    HPI Anita Walker is a 79 y.o. female.   79 yo female with a c/o nasal congestion and cough since yesterday. Denies any fevers, chills, chest pains, shortness of breath. No known sick contacts and patient has had both covid vaccines.      Past Medical History:  Diagnosis Date  . Anemia   . Arthritis   . Atrial fibrillation ([place])   . Breast cancer ([place])    s/p lumpectomy 1992.  s/p chemo and xrt left breast  . Diabetes mellitus ([place])   . Edema    feet/legs  . Gastric ulcer   . GERD (gastroesophageal reflux disease)   . HOH (hard of hearing)    aides  . Hypercholesterolemia   . Hypertension   . Pulmonary emboli [facility])     Patient Active Problem List   Diagnosis Date Noted  . DDD (degenerative disc disease), lumbosacral 09/18/2019  . Acute postoperative pain 09/18/2019  . Irritability and anger 09/18/2019  . Nausea without vomiting 09/18/2019  . History of vasovagal symptom 08/14/2019    Class: History of  . Polyp of descending colon   . Positive colorectal cancer screening using Cologuard test 05/25/2019  . Spondylosis without myelopathy or radiculopathy, lumbosacral region 03/11/2019  . 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 Compression fracture of thoracolumbar vertebra (T11), sequela 02/18/2019  . Lumbar facet arthropathy (Multilevel) 02/18/2019  . 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 L5-S1 disc-osteophyte complex (Right) 02/18/2019  . Lumbar facet syndrome (Bilateral) (R>L) 02/18/2019  . Chronic anticoagulation (Coumadin) 02/18/2019  . Chronic low back pain (Primary Area of Pain) (Bilateral) (R>L) w/o sciatica 02/18/2019  . Chronic pain syndrome 02/18/2019  . Compression fracture of thoracic  vertebra ([place]) 10/12/2018  . Pain due to onychomycosis of toenails of both feet 09/02/2018  . Coagulation disorder ([place]) 09/02/2018  . Constipation 08/18/2018  . Fall 08/18/2018  . Diabetic polyneuropathy associated with type 2 diabetes mellitus ([place]) 03/18/2017  . Abnormal chest CT 03/18/2017  . Urinary frequency 01/21/2017  . Syncope 11/10/2016  . Right hip pain 10/30/2016  . Cough 10/25/2016  . Health care maintenance 05/23/2014  . Mixed emotional features as adjustment reaction 07/20/2013  . Long term current use of anticoagulant therapy 04/11/2013  . Hypertension 01/01/2012  . Hypercholesteremia 01/01/2012  . History of breast cancer 01/01/2012  . Essential hypertension 01/01/2012  . Pure hypercholesterolemia 01/01/2012  . Type 2 diabetes mellitus without complications ([place]) 43/15/4008    Past Surgical History:  Procedure Laterality Date  . BREAST LUMPECTOMY  03/06/1990   left breast  . CATARACT EXTRACTION W/PHACO Right 12/26/2016   Procedure: CATARACT EXTRACTION PHACO AND INTRAOCULAR LENS PLACEMENT (IOC)-RIGHT DIABETIC;  Surgeon: Birder Robson, MD;  Location: [facility] ORS;  Service: Ophthalmology;  Laterality: Right;  [place] 00:47 AP% 24.5 CDE 11.62 Fluid pack lot # [id number]  . CATARACT EXTRACTION W/PHACO Left 01/23/2017   Procedure: CATARACT EXTRACTION PHACO AND INTRAOCULAR LENS PLACEMENT (IOC);  Surgeon: Birder Robson, MD;  Location: [facility] ORS;  Service: Ophthalmology;  Laterality: Left;  [place] 00:50 AP% 16.1 CDE 8.18 Fluid pack lot #[id number]  . COLONOSCOPY WITH PROPOFOL N/A 07/08/2019   Procedure: COLONOSCOPY WITH PROPOFOL;  Surgeon: Lucilla Lame, MD;  Location: [facility] END[facility]COPY;  Service: Endoscopy;  Laterality: N/A;    OB History   No obstetric history on file.      Home Medications    Prior to Admission medications   Medication Sig Start Date End Date Taking? Authorizing Provider  amLODipine (NORVASC) 2.5 MG tablet TAKE 1 TABLET(2.5 MG) BY MOUTH DAILY 07/15/19  Yes  Einar Pheasant, MD  CONTOUR TEST test strip USE TO TEST BLOOD SUGAR ONCE EVERY WEEK 02/13/19  Yes Einar Pheasant, MD  diphenhydrAMINE (BENADRYL) 25 mg capsule Take 25 mg at bedtime as needed by mouth for sleep.    Yes [provider]  ferrous sulfate (FEROSUL) 325 (65 FE) MG tablet Take 1 tablet (325 mg total) by mouth daily. 05/02/19  Yes Einar Pheasant, MD  losartan (COZAAR) 100 MG tablet Take 1 tablet (100 mg total) by mouth daily. 05/02/19  Yes Einar Pheasant, MD  Multiple Vitamin (MULTIVITAMIN WITH MINERALS) TABS tablet Take 1 tablet daily by mouth.   Yes [provider]  pantoprazole (PROTONIX) 40 MG tablet TAKE 1 TABLET(40 MG) BY MOUTH DAILY 05/02/19  Yes Einar Pheasant, MD  simvastatin (ZOCOR) 10 MG tablet TAKE 1 TABLET BY MOUTH EVERY DAY 05/02/19  Yes Einar Pheasant, MD  warfarin (COUMADIN) 2.5 MG tablet Takes 2 tablets (5 mg) on Tuesdays and Thursdays and Saturday, and 1 tablet (2.5 mg)  all other days of the week 08/14/18  Yes Einar Pheasant, MD  HYDROcodone-acetaminophen (NORCO/VICODIN) 5-325 MG tablet Take 1 tablet by mouth every 6 (six) hours as needed for up to 7 days for severe pain. Must last 7 days. 09/18/19 09/25/19  Milinda Pointer, MD  HYDROcodone-acetaminophen (NORCO/VICODIN) 5-325 MG tablet Take 1 tablet by mouth every 6 (six) hours as needed for up to 7 days for severe pain. Must last 7 days. 09/25/19 10/02/19  Milinda Pointer, MD    Family History Family History  Problem Relation Age of Onset  . Heart disease Father   . Heart disease Brother        s/p CABG  . Colon cancer Neg Hx   . Breast cancer Neg Hx     Social History Social History   Tobacco Use  . Smoking status: Never Smoker  . Smokeless tobacco: Never Used  Vaping Use  . Vaping Use: Never used  Substance Use Topics  . Alcohol use: No    Alcohol/week: 0.0 standard drinks  . Drug use: No     Allergies   Penicillins and Penicillin v potassium   Review of Systems Review of  Systems   Physical Exam Triage Vital Signs ED Triage Vitals  Enc Vitals Group     BP 09/17/19 0941 (!) 169/88     Pulse Rate 09/17/19 0941 94     Resp 09/17/19 0941 18     Temp 09/17/19 0941 97.9 F (36.6 C)     Temp Source 09/17/19 0941 Oral     SpO2 09/17/19 0941 95 %     Weight 09/17/19 0938 248 lb (112.5 kg)     Height 09/17/19 0938 5\' 9"  (1.753 m)     Head Circumference --      Peak Flow --      Pain Score 09/17/19 0938 0     Pain Loc --      Pain Edu? --      Excl. in [place]? --    No data found.  Updated Vital Signs BP (!) 169/88 (BP Location: Right Arm)   Pulse 94   Temp 97.9 F (  36.6 C) (Oral)   Resp 18   Ht 5\' 9"  (1.753 m)   Wt 112.5 kg   SpO2 95%   BMI 36.62 kg/m   Visual Acuity Right Eye Distance:   Left Eye Distance:   Bilateral Distance:    Right Eye Near:   Left Eye Near:    Bilateral Near:     Physical Exam Vitals and nursing note reviewed.  Constitutional:      General: She is not in acute distress.    Appearance: She is not toxic-appearing or diaphoretic.  HENT:     Right Ear: Tympanic membrane normal.     Left Ear: Tympanic membrane normal.     Nose: Congestion present.     Mouth/Throat:     Pharynx: Oropharynx is clear.  Cardiovascular:     Heart sounds: Normal heart sounds.  Pulmonary:     Effort: Pulmonary effort is normal. No respiratory distress.     Breath sounds: Normal breath sounds. No stridor. No wheezing, rhonchi or rales.  Neurological:     Mental Status: She is alert.      UC Treatments / Results  Labs (all labs ordered are listed, but only abnormal results are displayed) Labs Reviewed - No data to display  EKG   Radiology No results found.  Procedures Procedures (including critical care time)  Medications Ordered in UC Medications - No data to display  Initial Impression / Assessment and Plan / UC Course  I have reviewed the triage vital signs and the nursing notes.  Pertinent labs & imaging results  that were available during my care of the patient were reviewed by me and considered in my medical decision making (see chart for details).      Final Clinical Impressions(s) / UC Diagnoses   Final diagnoses:  Viral URI with cough  Nasal congestion     Discharge Instructions     Flonase nasal spray    ED Prescriptions    None      1. diagnosis reviewed with patient 2. Recommend supportive treatment with otc medications prn  3. Follow-up prn if symptoms worsen or don't improve   PDMP not reviewed this encounter.   Norval Gable, MD 09/21/19 1459

## 2019-09-22 ENCOUNTER — Telehealth: Payer: Self-pay

## 2019-09-22 NOTE — Telephone Encounter (Signed)
Pt scheduled for labs. Future order for INR placed.

## 2019-09-22 NOTE — Telephone Encounter (Signed)
PC to pt.  Unable to reach at this time.  VM left to return call to [place], RN, @ [facility], re: matter in relation to having procedure done at [facility] last week.  Advised to call [phone].

## 2019-09-29 ENCOUNTER — Other Ambulatory Visit (INDEPENDENT_AMBULATORY_CARE_PROVIDER_SITE_OTHER): Payer: Medicare Other

## 2019-09-29 ENCOUNTER — Other Ambulatory Visit: Payer: Self-pay

## 2019-09-29 DIAGNOSIS — Z7901 Long term (current) use of anticoagulants: Secondary | ICD-10-CM

## 2019-09-29 LAB — PROTIME-INR
INR: 1.9 ratio — ABNORMAL HIGH (ref 0.8–1.0)
Prothrombin Time: 20.9 s — ABNORMAL HIGH (ref 9.6–13.1)

## 2019-10-02 ENCOUNTER — Encounter: Payer: Self-pay | Admitting: Pain Medicine

## 2019-10-05 NOTE — Progress Notes (Signed)
Patient: Anita Walker  Service Category: E/M  Provider: Gaspar Cola, MD  DOB: 1940/10/26  DOS: 10/06/2019  Location: Office  MRN: [id number]  Setting: Ambulatory outpatient  Referring Provider: Einar Pheasant, MD  Type: Established Patient  Specialty: Interventional Pain Management  PCP: Einar Pheasant, MD  Location: Remote location  Delivery: TeleHealth     Virtual Encounter - Pain Management PROVIDER NOTE: Information contained herein reflects review and annotations entered in association with encounter. Interpretation of such information and data should be left to medically-trained personnel. Information provided to patient can be located elsewhere in the medical record under "Patient Instructions". Document created using STT-dictation technology, any transcriptional errors that may result from process are unintentional.    Contact & Pharmacy Preferred: [phone] Home: [phone] (home) Mobile: [phone] (mobile) E-mail: [email or web address]  [place], [place] - [place] AT [facility] [address] [place] [address] Phone: [phone] Fax: [phone]   Pre-screening  Anita Walker offered "in-person" vs "virtual" encounter. She indicated preferring virtual for this encounter.   Reason COVID-19*  Social distancing based on CDC and AMA recommendations.   I contacted Anita Walker on 10/06/2019 via telephone.      I clearly identified myself as Gaspar Cola, MD. I verified that I was speaking with the correct person using two identifiers (Name: Anita Walker, and date of birth: 11-03-1940).  Consent I sought verbal advanced consent from Anita Walker for virtual visit interactions. I informed Anita Walker of possible security and privacy concerns, risks, and limitations associated with providing "not-in-person" medical evaluation and management services. I also informed Anita Walker of the availability of "in-person"  appointments. Finally, I informed her that there would be a charge for the virtual visit and that she could be  personally, fully or partially, financially responsible for it. Anita Walker expressed understanding and agreed to proceed.   Historic Elements   Anita Walker is a 79 y.o. year old, female patient evaluated today after her last contact with our practice on 09/19/2019. Anita Walker  has a past medical history of Anemia, Arthritis, Atrial fibrillation ([place]), Breast cancer ([place]), Diabetes mellitus ([place]), Edema, Gastric ulcer, GERD (gastroesophageal reflux disease), HOH (hard of hearing), Hypercholesterolemia, Hypertension, and Pulmonary emboli ([place]). She also  has a past surgical history that includes Cataract extraction w/PHACO (Right, 12/26/2016); Cataract extraction w/PHACO (Left, 01/23/2017); Breast lumpectomy (03/06/1990); and Colonoscopy with propofol (N/A, 07/08/2019). Anita Walker has a current medication list which includes the following prescription(s): amlodipine, contour test, diphenhydramine, ferrous sulfate, losartan, multivitamin with minerals, pantoprazole, simvastatin, and warfarin. She  reports that she has never smoked. She has never used smokeless tobacco. She reports that she does not drink alcohol and does not use drugs. Anita Walker is allergic to penicillins and penicillin v potassium.   HPI  Today, she is being contacted for a post-procedure assessment.  According to the patient with a radiofrequency ablation of the right lumbar facets have provided her with 100% relief of the pain on that right side.  She refers that she is not hurting on the right side at all.  However, she is having pain on the left side, which we have not treated.  Today we asked the patient if she was interested in treating that left side and she indicated that she would be very much interested in getting the left side to feel just like the right feels now.  Post-Procedure Evaluation  Procedure (09/18/2019):  Therapeutic right lumbar facet RFA #1 under fluoroscopic guidance and IV sedation Pre-procedure pain level: 5/10 Post-procedure: 0/10 (100% relief)  Sedation: Sedation provided.  Effectiveness during initial hour after procedure(Ultra-Short Term Relief): 90 %.  Local anesthetic used: Long-acting (4-6 hours) Effectiveness: Defined as any analgesic benefit obtained secondary to the administration of local anesthetics. This carries significant diagnostic value as to the etiological location, or anatomical origin, of the pain. Duration of benefit is expected to coincide with the duration of the local anesthetic used.  Effectiveness during initial 4-6 hours after procedure(Short-Term Relief): 90 %.  Long-term benefit: Defined as any relief past the pharmacologic duration of the local anesthetics.  Effectiveness past the initial 6 hours after procedure(Long-Term Relief): 90 % (has only been 14 days since procedure).  Current benefits: Defined as benefit that persist at this time.   Analgesia:  90-100% better Function: Anita Walker reports improvement in function ROM: Anita Walker reports improvement in ROM  Pharmacotherapy Assessment  Analgesic: None.  Highest recorded MME/day: 25 mg/day MME/day: 0 mg/day   Monitoring: [place] PMP: PDMP reviewed during this encounter.       Pharmacotherapy: No side-effects or adverse reactions reported. Compliance: No problems identified. Effectiveness: Clinically acceptable. Plan: Refer to "POC".  UDS: No results found for: SUMMARY  Laboratory Chemistry Profile   Renal Lab Results  Component Value Date   BUN 15 05/06/2019   CREATININE 0.87 05/06/2019   GFR 62.47 05/06/2019   GFRAA >60 08/07/2018   GFRNONAA >60 08/07/2018     Hepatic Lab Results  Component Value Date   AST 19 05/06/2019   ALT 15 05/06/2019   ALBUMIN 3.9 05/06/2019   ALKPHOS 58 05/06/2019     Electrolytes Lab Results  Component Value Date   NA 137 05/06/2019   K 4.5 05/06/2019    CL 102 05/06/2019   CALCIUM 9.5 05/06/2019     Bone No results found for: VD25OH, VD125OH2TOT, NI6270JJ0, KX3818EX9, 25OHVITD1, 25OHVITD2, 25OHVITD3, TESTOFREE, TESTOSTERONE   Inflammation (CRP: Acute Phase) (ESR: Chronic Phase) Lab Results  Component Value Date   CRP <0.8 10/31/2016   ESRSEDRATE 5 10/31/2016   LATICACIDVEN 1.1 10/30/2016       Note: Above Lab results reviewed.   Imaging  DG PAIN CLINIC C-ARM 1-60 MIN NO REPORT Fluoro was used, but no Radiologist interpretation will be provided.  Please refer to "NOTES" tab for provider progress note.  Assessment  The primary encounter diagnosis was Chronic pain syndrome. 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  Plan of Care  Problem-specific:  No problem-specific Assessment & Plan notes found for this encounter.  Anita Walker has a current medication list which includes the following long-term medication(s): amlodipine, diphenhydramine, ferrous sulfate, losartan, pantoprazole, simvastatin, and warfarin.  Pharmacotherapy (Medications Ordered): No orders of the defined types were placed in this encounter.  Orders:  Orders Placed This Encounter  Procedures  . Radiofrequency,Lumbar    Standing Status:   Future    Standing Expiration Date:   10/05/2020    Scheduling Instructions:     Side(s): Left-sided     Level: L3-4, L4-5, & L5-S1 Facets (L2, L3, L4, L5, & S1 Medial Branch Nerves)     Sedation: Patient's choice.  Scheduling Timeframe: As soon as pre-approved    Order Specific Question:   Where will this procedure be performed?    Answer:   [facility]   Follow-up plan:   Return for RFA (w/  sedation): (L) L-FCT RFA #1, (Blood Thinner Protocol), (ASAP).      Interventional treatment options: Planned, scheduled, and/or pending:   None at this time.   Under consideration:  NOTE: COUMADIN ANTICOAGULATION (Stop: 5 days  Restart: 2 hrs)  Diagnostic right-sided vs bilateral lumbar facet block #2    Therapeutic/palliative (PRN):   Diagnostic right lumbar facet block #2       Recent Visits Date Type Provider Dept  09/18/19 Procedure visit Milinda Pointer, MD [facility]  08/28/19 Telemedicine Milinda Pointer, MD [facility]  08/14/19 Procedure visit Milinda Pointer, MD [facility]  Showing recent visits within past 90 days and meeting all other requirements Today's Visits Date Type Provider Dept  10/06/19 Telemedicine Milinda Pointer, MD [facility]  Showing today's visits and meeting all other requirements Future Appointments No visits were found meeting these conditions. Showing future appointments within next 90 days and meeting all other requirements  I discussed the assessment and treatment plan with the patient. The patient was provided an opportunity to ask questions and all were answered. The patient agreed with the plan and demonstrated an understanding of the instructions.  Patient advised to call back or seek an in-person evaluation if the symptoms or condition worsens.  Duration of encounter: 13 minutes.  Note by: Gaspar Cola, MD Date: 10/06/2019; Time: 5:19 PM

## 2019-10-06 ENCOUNTER — Other Ambulatory Visit: Payer: Self-pay

## 2019-10-06 ENCOUNTER — Ambulatory Visit: Payer: Medicare Other | Attending: Pain Medicine | Admitting: Pain Medicine

## 2019-10-06 DIAGNOSIS — M47816 Spondylosis without myelopathy or radiculopathy, lumbar region: Secondary | ICD-10-CM

## 2019-10-06 DIAGNOSIS — S32010S Wedge compression fracture of first lumbar vertebra, sequela: Secondary | ICD-10-CM | POA: Diagnosis not present

## 2019-10-06 DIAGNOSIS — G894 Chronic pain syndrome: Secondary | ICD-10-CM

## 2019-10-06 DIAGNOSIS — M545 Low back pain: Secondary | ICD-10-CM

## 2019-10-06 DIAGNOSIS — S22080S Wedge compression fracture of T11-T12 vertebra, sequela: Secondary | ICD-10-CM

## 2019-10-06 DIAGNOSIS — S32030S Wedge compression fracture of third lumbar vertebra, sequela: Secondary | ICD-10-CM

## 2019-10-06 DIAGNOSIS — G8929 Other chronic pain: Secondary | ICD-10-CM | POA: Diagnosis not present

## 2019-10-06 NOTE — Patient Instructions (Signed)
____________________________________________________________________________________________  Preparing for Procedure with Sedation  Procedure appointments are limited to planned procedures: . No Prescription Refills. . No disability issues will be discussed. . No medication changes will be discussed.  Instructions: . Oral Intake: Do not eat or drink anything for at least 8 hours prior to your procedure. (Exception: Blood Pressure Medication. See below.) . Transportation: Unless otherwise stated by your physician, you may drive yourself after the procedure. . Blood Pressure Medicine: Do not forget to take your blood pressure medicine with a sip of water the morning of the procedure. If your Diastolic (lower reading)is above 100 mmHg, elective cases will be cancelled/rescheduled. . Blood thinners: These will need to be stopped for procedures. Notify our staff if you are taking any blood thinners. Depending on which one you take, there will be specific instructions on how and when to stop it. . Diabetics on insulin: Notify the staff so that you can be scheduled 1st case in the morning. If your diabetes requires high dose insulin, take only  of your normal insulin dose the morning of the procedure and notify the staff that you have done so. . Preventing infections: Shower with an antibacterial soap the morning of your procedure. . Build-up your immune system: Take 1000 mg of Vitamin C with every meal (3 times a day) the day prior to your procedure. . Antibiotics: Inform the staff if you have a condition or reason that requires you to take antibiotics before dental procedures. . Pregnancy: If you are pregnant, call and cancel the procedure. . Sickness: If you have a cold, fever, or any active infections, call and cancel the procedure. . Arrival: You must be in the facility at least 30 minutes prior to your scheduled procedure. . Children: Do not bring children with you. . Dress appropriately:  Bring dark clothing that you would not mind if they get stained. . Valuables: Do not bring any jewelry or valuables.  Reasons to call and reschedule or cancel your procedure: (Following these recommendations will minimize the risk of a serious complication.) . Surgeries: Avoid having procedures within 2 weeks of any surgery. (Avoid for 2 weeks before or after any surgery). . Flu Shots: Avoid having procedures within 2 weeks of a flu shots or . (Avoid for 2 weeks before or after immunizations). . Barium: Avoid having a procedure within 7-10 days after having had a radiological study involving the use of radiological contrast. (Myelograms, Barium swallow or enema study). . Heart attacks: Avoid any elective procedures or surgeries for the initial 6 months after a "Myocardial Infarction" (Heart Attack). . Blood thinners: It is imperative that you stop these medications before procedures. Let us know if you if you take any blood thinner.  . Infection: Avoid procedures during or within two weeks of an infection (including chest colds or gastrointestinal problems). Symptoms associated with infections include: Localized redness, fever, chills, night sweats or profuse sweating, burning sensation when voiding, cough, congestion, stuffiness, runny nose, sore throat, diarrhea, nausea, vomiting, cold or Flu symptoms, recent or current infections. It is specially important if the infection is over the area that we intend to treat. . Heart and lung problems: Symptoms that may suggest an active cardiopulmonary problem include: cough, chest pain, breathing difficulties or shortness of breath, dizziness, ankle swelling, uncontrolled high or unusually low blood pressure, and/or palpitations. If you are experiencing any of these symptoms, cancel your procedure and contact your primary care physician for an evaluation.  Remember:  Regular Business hours are:    Monday to Thursday 8:00 AM to 4:00 PM  Provider's  Schedule: Greco Gastelum, MD:  Procedure days: Tuesday and Thursday 7:30 AM to 4:00 PM  Bilal Lateef, MD:  Procedure days: Monday and Wednesday 7:30 AM to 4:00 PM ____________________________________________________________________________________________    

## 2019-10-08 ENCOUNTER — Other Ambulatory Visit (INDEPENDENT_AMBULATORY_CARE_PROVIDER_SITE_OTHER): Payer: Medicare Other

## 2019-10-08 ENCOUNTER — Other Ambulatory Visit: Payer: Self-pay

## 2019-10-08 DIAGNOSIS — Z7901 Long term (current) use of anticoagulants: Secondary | ICD-10-CM

## 2019-10-08 LAB — PROTIME-INR
INR: 2 ratio — ABNORMAL HIGH (ref 0.8–1.0)
Prothrombin Time: 22.3 s — ABNORMAL HIGH (ref 9.6–13.1)

## 2019-10-17 ENCOUNTER — Ambulatory Visit (INDEPENDENT_AMBULATORY_CARE_PROVIDER_SITE_OTHER): Payer: Medicare Other | Admitting: *Deleted

## 2019-10-17 ENCOUNTER — Other Ambulatory Visit: Payer: Self-pay | Admitting: Internal Medicine

## 2019-10-17 ENCOUNTER — Other Ambulatory Visit: Payer: Self-pay

## 2019-10-17 DIAGNOSIS — N3941 Urge incontinence: Secondary | ICD-10-CM | POA: Diagnosis not present

## 2019-10-17 NOTE — Progress Notes (Signed)
PTNS Monthly    Health & Social Factors: No change Caffeine: 0 Alcohol: 0 Daytime voids #per day: 13 Night-time voids #per night: 2 Urgency: mild Incontinence Episodes #per day: 1 Ankle used: right Treatment Setting: 19 Feeling/ Response: both Comments: Tolerated well  Performed By: Verlene Mayer, CMA   Follow Up: One month

## 2019-10-18 ENCOUNTER — Other Ambulatory Visit: Payer: Self-pay | Admitting: Internal Medicine

## 2019-10-21 ENCOUNTER — Other Ambulatory Visit: Payer: Self-pay

## 2019-10-21 ENCOUNTER — Emergency Department: Payer: Medicare Other

## 2019-10-21 ENCOUNTER — Encounter: Payer: Self-pay | Admitting: Emergency Medicine

## 2019-10-21 ENCOUNTER — Emergency Department
Admission: EM | Admit: 2019-10-21 | Discharge: 2019-10-21 | Disposition: A | Discharge status: Home / Self Care | Payer: Medicare Other | Attending: Emergency Medicine | Admitting: Emergency Medicine

## 2019-10-21 DIAGNOSIS — Z79899 Other long term (current) drug therapy: Secondary | ICD-10-CM | POA: Diagnosis not present

## 2019-10-21 DIAGNOSIS — Y939 Activity, unspecified: Secondary | ICD-10-CM | POA: Insufficient documentation

## 2019-10-21 DIAGNOSIS — S52512A Displaced fracture of left radial styloid process, initial encounter for closed fracture: Secondary | ICD-10-CM | POA: Diagnosis not present

## 2019-10-21 DIAGNOSIS — S52612A Displaced fracture of left ulna styloid process, initial encounter for closed fracture: Secondary | ICD-10-CM | POA: Insufficient documentation

## 2019-10-21 DIAGNOSIS — W19XXXA Unspecified fall, initial encounter: Secondary | ICD-10-CM | POA: Diagnosis not present

## 2019-10-21 DIAGNOSIS — S62232A Other displaced fracture of base of first metacarpal bone, left hand, initial encounter for closed fracture: Secondary | ICD-10-CM | POA: Diagnosis not present

## 2019-10-21 DIAGNOSIS — S62201A Unspecified fracture of first metacarpal bone, right hand, initial encounter for closed fracture: Secondary | ICD-10-CM | POA: Insufficient documentation

## 2019-10-21 DIAGNOSIS — I10 Essential (primary) hypertension: Secondary | ICD-10-CM | POA: Insufficient documentation

## 2019-10-21 DIAGNOSIS — S62292A Other fracture of first metacarpal bone, left hand, initial encounter for closed fracture: Secondary | ICD-10-CM | POA: Diagnosis not present

## 2019-10-21 DIAGNOSIS — R609 Edema, unspecified: Secondary | ICD-10-CM | POA: Diagnosis not present

## 2019-10-21 DIAGNOSIS — Y929 Unspecified place or not applicable: Secondary | ICD-10-CM | POA: Insufficient documentation

## 2019-10-21 DIAGNOSIS — E119 Type 2 diabetes mellitus without complications: Secondary | ICD-10-CM | POA: Diagnosis not present

## 2019-10-21 DIAGNOSIS — S52515A Nondisplaced fracture of left radial styloid process, initial encounter for closed fracture: Secondary | ICD-10-CM | POA: Diagnosis not present

## 2019-10-21 DIAGNOSIS — Y999 Unspecified external cause status: Secondary | ICD-10-CM | POA: Diagnosis not present

## 2019-10-21 DIAGNOSIS — M79642 Pain in left hand: Secondary | ICD-10-CM | POA: Diagnosis present

## 2019-10-21 DIAGNOSIS — R52 Pain, unspecified: Secondary | ICD-10-CM | POA: Diagnosis not present

## 2019-10-21 DIAGNOSIS — S62202A Unspecified fracture of first metacarpal bone, left hand, initial encounter for closed fracture: Secondary | ICD-10-CM | POA: Diagnosis not present

## 2019-10-21 NOTE — ED Triage Notes (Signed)
Pt to triage via w/c with no distress noted; EMS brought pt in from home; st tripped and fell injuring left hand; denies any other c/o or injuries

## 2019-10-21 NOTE — ED Notes (Signed)
Pt oob to bathroom with max assist.

## 2019-10-21 NOTE — Discharge Instructions (Signed)
Please wear the splint until follow up with orthopedics.  Take tylenol if needed for pain.  Return to the ER for symptoms of concern if unable to see primary care or the orthopedist.

## 2019-10-21 NOTE — ED Notes (Signed)
See triage note - pt with + pulse left hand. NAD

## 2019-10-21 NOTE — ED Provider Notes (Signed)
[facility] Emergency Department Provider Note ____________________________________________  Time seen: Approximately 2:10 PM  I have reviewed the triage vital signs and the nursing notes.   HISTORY  Chief Complaint Fall    HPI Anita Walker is a 79 y.o. female who presents to the emergency department for evaluation and treatment of left hand pain after a mechanical, nonsyncopal fall this morning.  Patient states that she was getting out of bed and she fell forward.  She caught herself with her hand and denies striking her head.  No alleviating measures attempted prior to arrival.   Past Medical History:  Diagnosis Date  . Anemia   . Arthritis   . Atrial fibrillation ([place])   . Breast cancer ([place])    s/p lumpectomy 1992.  s/p chemo and xrt left breast  . Diabetes mellitus ([place])   . Edema    feet/legs  . Gastric ulcer   . GERD (gastroesophageal reflux disease)   . HOH (hard of hearing)    aides  . Hypercholesterolemia   . Hypertension   . Pulmonary emboli [facility])     Patient Active Problem List   Diagnosis Date Noted  . DDD (degenerative disc disease), lumbosacral 09/18/2019  . Acute postoperative pain 09/18/2019  . Irritability and anger 09/18/2019  . Nausea without vomiting 09/18/2019  . History of vasovagal symptom 08/14/2019    Class: History of  . Polyp of descending colon   . Positive colorectal cancer screening using Cologuard test 05/25/2019  . Spondylosis without myelopathy or radiculopathy, lumbosacral region 03/11/2019  . 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 Compression fracture of thoracolumbar vertebra (T11), sequela 02/18/2019  . Lumbar facet arthropathy (Multilevel) 02/18/2019  . 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 L5-S1 disc-osteophyte complex (Right) 02/18/2019  . Lumbar facet syndrome (Bilateral) (R>L) 02/18/2019  . Chronic  anticoagulation (Coumadin) 02/18/2019  . Chronic low back pain (Primary Area of Pain) (Bilateral) (R>L) w/o sciatica 02/18/2019  . Chronic pain syndrome 02/18/2019  . Compression fracture of thoracic vertebra ([place]) 10/12/2018  . Pain due to onychomycosis of toenails of both feet 09/02/2018  . Coagulation disorder ([place]) 09/02/2018  . Constipation 08/18/2018  . Fall 08/18/2018  . Diabetic polyneuropathy associated with type 2 diabetes mellitus ([place]) 03/18/2017  . Abnormal chest CT 03/18/2017  . Urinary frequency 01/21/2017  . Syncope 11/10/2016  . Right hip pain 10/30/2016  . Cough 10/25/2016  . Health care maintenance 05/23/2014  . Mixed emotional features as adjustment reaction 07/20/2013  . Long term current use of anticoagulant therapy 04/11/2013  . Hypertension 01/01/2012  . Hypercholesteremia 01/01/2012  . History of breast cancer 01/01/2012  . Essential hypertension 01/01/2012  . Pure hypercholesterolemia 01/01/2012  . Type 2 diabetes mellitus without complications ([place]) 32/02/2481    Past Surgical History:  Procedure Laterality Date  . BREAST LUMPECTOMY  03/06/1990   left breast  . CATARACT EXTRACTION W/PHACO Right 12/26/2016   Procedure: CATARACT EXTRACTION PHACO AND INTRAOCULAR LENS PLACEMENT (IOC)-RIGHT DIABETIC;  Surgeon: Birder Robson, MD;  Location: [facility] ORS;  Service: Ophthalmology;  Laterality: Right;  [place] 00:47 AP% 24.5 CDE 11.62 Fluid pack lot # [id number]  . CATARACT EXTRACTION W/PHACO Left 01/23/2017   Procedure: CATARACT EXTRACTION PHACO AND INTRAOCULAR LENS PLACEMENT (IOC);  Surgeon: Birder Robson, MD;  Location: [facility] ORS;  Service: Ophthalmology;  Laterality: Left;  [place] 00:50 AP% 16.1 CDE 8.18 Fluid pack lot #[id number]  . COLONOSCOPY WITH PROPOFOL  N/A 07/08/2019   Procedure: COLONOSCOPY WITH PROPOFOL;  Surgeon: Lucilla Lame, MD;  Location: [facility] ENDOSCOPY;  Service: Endoscopy;  Laterality: N/A;    Prior to Admission medications   Medication Sig Start  Date End Date Taking? Authorizing Provider  amLODipine (NORVASC) 2.5 MG tablet TAKE 1 TABLET(2.5 MG) BY MOUTH DAILY 07/15/19   Einar Pheasant, MD  CONTOUR TEST test strip USE TO TEST BLOOD SUGAR ONCE EVERY WEEK 02/13/19   Einar Pheasant, MD  diphenhydrAMINE (BENADRYL) 25 mg capsule Take 25 mg at bedtime as needed by mouth for sleep.     [provider]  FEROSUL 325 (65 Fe) MG tablet TAKE 1 TABLET(325 MG) BY MOUTH DAILY 10/17/19   Einar Pheasant, MD  losartan (COZAAR) 100 MG tablet Take 1 tablet (100 mg total) by mouth daily. 05/02/19   Einar Pheasant, MD  Multiple Vitamin (MULTIVITAMIN WITH MINERALS) TABS tablet Take 1 tablet daily by mouth.    [provider]  pantoprazole (PROTONIX) 40 MG tablet TAKE 1 TABLET(40 MG) BY MOUTH DAILY 10/20/19   Einar Pheasant, MD  simvastatin (ZOCOR) 10 MG tablet TAKE 1 TABLET BY MOUTH EVERY DAY 10/17/19   Einar Pheasant, MD  warfarin (COUMADIN) 2.5 MG tablet Takes 2 tablets (5 mg) on Tuesdays and Thursdays and Saturday, and 1 tablet (2.5 mg)  all other days of the week 08/14/18   Einar Pheasant, MD    Allergies Penicillins and Penicillin v potassium  Family History  Problem Relation Age of Onset  . Heart disease Father   . Heart disease Brother        s/p CABG  . Colon cancer Neg Hx   . Breast cancer Neg Hx     Social History Social History   Tobacco Use  . Smoking status: Never Smoker  . Smokeless tobacco: Never Used  Vaping Use  . Vaping Use: Never used  Substance Use Topics  . Alcohol use: No    Alcohol/week: 0.0 standard drinks  . Drug use: No    Review of Systems Constitutional: Negative for fever. Cardiovascular: Negative for chest pain. Respiratory: Negative for shortness of breath. Musculoskeletal: Positive for left hand pain. Skin: Positive for swelling and bruising to the left hand. Neurological: Negative for decrease in sensation  ____________________________________________   PHYSICAL EXAM:  VITAL  SIGNS: ED Triage Vitals  Enc Vitals Group     BP 10/21/19 0559 (!) 156/90     Pulse Rate 10/21/19 0559 95     Resp 10/21/19 0559 18     Temp 10/21/19 0559 98.6 F (37 C)     Temp Source 10/21/19 0559 Oral     SpO2 10/21/19 0559 95 %     Weight 10/21/19 0601 244 lb (110.7 kg)     Height 10/21/19 0601 5\' 9"  (1.753 m)     Head Circumference --      Peak Flow --      Pain Score 10/21/19 0600 7     Pain Loc --      Pain Edu? --      Excl. in [place]? --     Constitutional: Alert and oriented. Well appearing and in no acute distress. Eyes: Conjunctivae are clear without discharge or drainage Head: Atraumatic Neck: Supple.  No focal midline tenderness. Respiratory: No cough. Respirations are even and unlabored. Musculoskeletal: Focal tenderness over the base of the left first metacarpal and distal radius.  No obvious deformity. Neurologic: Patient awake, alert, and oriented.  Motor and sensory function, specifically of  the left hand and wrist is intact. Skin: Ecchymosis is noted over the left hand.  Patient able to perform a fist. Psychiatric: Affect and behavior are appropriate.  ____________________________________________   LABS (all labs ordered are listed, but only abnormal results are displayed)  Labs Reviewed - No data to display ____________________________________________  RADIOLOGY  Image of the left hand shows comminuted, angulated, fracture of the base of the left first metacarpal.  Also a nondisplaced radial styloid fracture is noted.  I, Sherrie George, personally viewed and evaluated these images (plain radiographs) as part of my medical decision making, as well as reviewing the written report by the radiologist.  DG Hand Complete Left  Result Date: 10/21/2019 CLINICAL DATA:  Fall.  Swelling and pain. EXAM: LEFT HAND - COMPLETE 3+ VIEW COMPARISON:  No recent. FINDINGS: Comminuted angulated fracture noted the base of the left first metacarpal. Nondisplaced radial  styloid fracture noted. Diffuse degenerative change noted. Diffuse osteopenia noted. No radiopaque foreign body. IMPRESSION: Comminuted angulated fracture of the base of the left first metacarpal. Nondisplaced radial styloid fracture noted. Electronically Signed   By: Marcello Moores  Register   On: 10/21/2019 06:53   ____________________________________________   PROCEDURES  .Ortho Injury Treatment  Date/Time: 10/21/2019 2:16 PM Performed by: Victorino Dike, FNP Authorized by: Victorino Dike, FNP   Consent:    Consent obtained:  Verbal   Consent given by:  Patient   Risks discussed:  StiffnessInjury location: hand Location details: left hand Injury type: fracture Fracture type: first metacarpal Pre-procedure neurovascular assessment: neurovascularly intact Pre-procedure distal perfusion: normal Pre-procedure neurological function: normal Pre-procedure range of motion: reduced Immobilization: splint Splint type: Wrist splint with abducted thumb. Post-procedure neurovascular assessment: post-procedure neurovascularly intact Post-procedure distal perfusion: normal Post-procedure neurological function: normal Post-procedure range of motion: normal Patient tolerance: patient tolerated the procedure well with no immediate complications     ____________________________________________   INITIAL IMPRESSION / ASSESSMENT AND PLAN / ED COURSE  Jo Ann R Walker is a 79 y.o. who presents to the emergency department for treatment and evaluation after mechanical, nonsyncopal fall at home this morning prior to arrival.  See HPI for further details.  Left hand images show fracture at the base of the first meta carpal as well as radial styloid.  Patient will be placed in a prefabricated wrist splint with thumb abducted.  She was able to remove several rings from her left hand without difficulty.  She has placed them with her personal belongings.  Patient advised to call and schedule follow-up  appointment with orthopedics.  She was encouraged to wear the splint at all times until follow-up.  She is to return to the emergency department for symptoms that change or worsen or for new concerns if unable to schedule an appointment with primary care or orthopedics.  Medications - No data to display  Pertinent labs & imaging results that were available during my care of the patient were reviewed by me and considered in my medical decision making (see chart for details).   _________________________________________   FINAL CLINICAL IMPRESSION(S) / ED DIAGNOSES  Final diagnoses:  Closed displaced fracture of base of first metacarpal bone of left hand, unspecified fracture morphology, initial encounter  Closed displaced fracture of styloid process of left radius, initial encounter    ED Discharge Orders    None       If controlled substance prescribed during this visit, 12 month history viewed on the [place] prior to issuing an initial prescription for Schedule II  or III opiod.   Victorino Dike, FNP 10/21/19 1418    Lucrezia Starch, MD 10/21/19 1432

## 2019-10-23 ENCOUNTER — Ambulatory Visit: Payer: Medicare Other | Admitting: Pain Medicine

## 2019-10-24 ENCOUNTER — Ambulatory Visit (INDEPENDENT_AMBULATORY_CARE_PROVIDER_SITE_OTHER): Payer: Medicare Other | Admitting: Internal Medicine

## 2019-10-24 ENCOUNTER — Other Ambulatory Visit: Payer: Self-pay

## 2019-10-24 DIAGNOSIS — E7800 Pure hypercholesterolemia, unspecified: Secondary | ICD-10-CM

## 2019-10-24 DIAGNOSIS — R531 Weakness: Secondary | ICD-10-CM | POA: Diagnosis not present

## 2019-10-24 DIAGNOSIS — I10 Essential (primary) hypertension: Secondary | ICD-10-CM | POA: Diagnosis not present

## 2019-10-24 DIAGNOSIS — R198 Other specified symptoms and signs involving the digestive system and abdomen: Secondary | ICD-10-CM | POA: Diagnosis not present

## 2019-10-24 DIAGNOSIS — E119 Type 2 diabetes mellitus without complications: Secondary | ICD-10-CM | POA: Diagnosis not present

## 2019-10-24 DIAGNOSIS — Z7901 Long term (current) use of anticoagulants: Secondary | ICD-10-CM

## 2019-10-24 DIAGNOSIS — S62109D Fracture of unspecified carpal bone, unspecified wrist, subsequent encounter for fracture with routine healing: Secondary | ICD-10-CM

## 2019-10-24 DIAGNOSIS — S62102D Fracture of unspecified carpal bone, left wrist, subsequent encounter for fracture with routine healing: Secondary | ICD-10-CM | POA: Diagnosis not present

## 2019-10-24 NOTE — Patient Instructions (Signed)
citrucel - mix in whatever you drink in the morning - once daily.

## 2019-10-24 NOTE — Progress Notes (Signed)
[name], female   DOB: 1940-06-30, 79 y.o.   MRN: [id number]   Subjective:    [name], female    DOB: 1940/08/23, 79 y.o.   MRN: [id number]  HPI This visit occurred during the SARS-CoV-2 public health emergency.  Safety protocols were in place, including screening questions prior to the visit, additional usage of staff PPE, and extensive cleaning of exam room while observing appropriate contact time as indicated for disinfecting solutions.  Patient here for a scheduled follow up.  Recent fall.  Was getting oob and fell forwards.  She caught herself with her hand.  No head injury.  Xray - fracture at the base of the first metacarpal/radial styloid.  Brace - referred to ortho.  Has f/u with ortho next week.  She needs help with bathing.  Granddaughter staying with her.  No chest pain or sob.  No acid reflux or abdominal pain reported.  Previous diarrhea.  This has resolved.  Discussed home health PT to help with gait, balance, etc.  She is agreeable.    Past Medical History:  Diagnosis Date  . Anemia   . Arthritis   . Atrial fibrillation ([place])   . Breast cancer ([place])    s/p lumpectomy 1992.  s/p chemo and xrt left breast  . Diabetes mellitus ([place])   . Edema    feet/legs  . Gastric ulcer   . GERD (gastroesophageal reflux disease)   . HOH (hard of hearing)    aides  . Hypercholesterolemia   . Hypertension   . Pulmonary emboli [facility])    Past Surgical History:  Procedure Laterality Date  . BREAST LUMPECTOMY  03/06/1990   left breast  . CATARACT EXTRACTION W/PHACO Right 12/26/2016   Procedure: CATARACT EXTRACTION PHACO AND INTRAOCULAR LENS PLACEMENT (IOC)-RIGHT DIABETIC;  Surgeon: Birder Robson, MD;  Location: [facility] ORS;  Service: Ophthalmology;  Laterality: Right;  [place] 00:47 AP% 24.5 CDE 11.62 Fluid pack lot # [id number]  . CATARACT EXTRACTION W/PHACO Left 01/23/2017   Procedure: CATARACT EXTRACTION PHACO AND INTRAOCULAR LENS PLACEMENT (IOC);   Surgeon: Birder Robson, MD;  Location: [facility] ORS;  Service: Ophthalmology;  Laterality: Left;  [place] 00:50 AP% 16.1 CDE 8.18 Fluid pack lot #[id number] H  . COLONOSCOPY WITH PROPOFOL N/A 07/08/2019   Procedure: COLONOSCOPY WITH PROPOFOL;  Surgeon: Lucilla Lame, MD;  Location[facility] ENDOSCOPY;  Service: Endoscopy;  Laterality: N/A;   Family History  Problem Relation Age of Onset  . Heart disease Father   . Heart disease Brother        s/p CABG  . Colon cancer Neg Hx   . Breast cancer Neg Hx    Social History   Socioeconomic History  . Marital status: Widowed    Spouse name: Not on file  . Number of children: Not on file  . Years of education: Not on file  . Highest education level: Not on file  Occupational History  . Not on file  Tobacco Use  . Smoking status: Never Smoker  . Smokeless tobacco: Never Used  Vaping Use  . Vaping Use: Never used  Substance and Sexual Activity  . Alcohol use: No    Alcohol/week: 0.0 standard drinks  . Drug use: No  . Sexual activity: Never  Other Topics Concern  . Not on file  Social History Narrative  . Not on file   Social Determinants of Health   Financial Resource Strain: Low Risk   . Difficulty of Paying  Living Expenses: Not hard at all  Food Insecurity: No Food Insecurity  . Worried About Charity fundraiser in the Last Year: Never true  . Ran Out of Food in the Last Year: Never true  Transportation Needs: No Transportation Needs  . Lack of Transportation (Medical): No  . Lack of Transportation (Non-Medical): No  Physical Activity:   . Days of Exercise per Week: Not on file  . Minutes of Exercise per Session: Not on file  Stress: No Stress Concern Present  . Feeling of Stress : Not at all  Social Connections: Unknown  . Frequency of Communication with Friends and Family: More than three times a week  . Frequency of Social Gatherings with Friends and Family: Not on file  . Attends Religious Services: Not on file  . Active Member  of Clubs or Organizations: Not on file  . Attends Archivist Meetings: Not on file  . Marital Status: Not on file    Outpatient Encounter Medications as of 10/24/2019  Medication Sig  . amLODipine (NORVASC) 2.5 MG tablet TAKE 1 TABLET(2.5 MG) BY MOUTH DAILY  . CONTOUR TEST test strip USE TO TEST BLOOD SUGAR ONCE EVERY WEEK  . diphenhydrAMINE (BENADRYL) 25 mg capsule Take 25 mg at bedtime as needed by mouth for sleep.   Marland Kitchen FEROSUL 325 (65 Fe) MG tablet TAKE 1 TABLET(325 MG) BY MOUTH DAILY  . losartan (COZAAR) 100 MG tablet Take 1 tablet (100 mg total) by mouth daily.  . Multiple Vitamin (MULTIVITAMIN WITH MINERALS) TABS tablet Take 1 tablet daily by mouth.  . pantoprazole (PROTONIX) 40 MG tablet TAKE 1 TABLET(40 MG) BY MOUTH DAILY  . simvastatin (ZOCOR) 10 MG tablet TAKE 1 TABLET BY MOUTH EVERY DAY  . warfarin (COUMADIN) 2.5 MG tablet Takes 2 tablets (5 mg) on Tuesdays and Thursdays and Saturday, and 1 tablet (2.5 mg)  all other days of the week   No facility-administered encounter medications on file as of 10/24/2019.   Review of Systems  Constitutional: Negative for appetite change and unexpected weight change.  HENT: Negative for congestion and sinus pressure.   Respiratory: Negative for cough, chest tightness and shortness of breath.   Cardiovascular: Negative for chest pain, palpitations and leg swelling.  Gastrointestinal: Negative for abdominal pain, diarrhea, nausea and vomiting.  Genitourinary: Negative for difficulty urinating and dysuria.  Musculoskeletal: Negative for joint swelling and myalgias.  Skin: Negative for color change and rash.  Neurological: Negative for dizziness, light-headedness and headaches.  Psychiatric/Behavioral: Negative for agitation and dysphoric mood.       Objective:    Physical Exam Vitals reviewed.  Constitutional:      General: She is not in acute distress.    Appearance: Normal appearance.  HENT:     Head: Normocephalic and  atraumatic.     Right Ear: External ear normal.     Left Ear: External ear normal.  Eyes:     General: No scleral icterus.       Right eye: No discharge.        Left eye: No discharge.     Conjunctiva/sclera: Conjunctivae normal.  Neck:     Thyroid: No thyromegaly.  Cardiovascular:     Rate and Rhythm: Normal rate and regular rhythm.  Pulmonary:     Effort: No respiratory distress.     Breath sounds: Normal breath sounds. No wheezing.  Abdominal:     General: Bowel sounds are normal.     Palpations: Abdomen is soft.  Tenderness: There is no abdominal tenderness.  Musculoskeletal:        General: No swelling or tenderness.     Cervical back: Neck supple. No tenderness.  Lymphadenopathy:     Cervical: No cervical adenopathy.  Skin:    Findings: No erythema or rash.  Neurological:     Mental Status: She is alert.  Psychiatric:        Mood and Affect: Mood normal.        Behavior: Behavior normal.     BP 138/70   Pulse 96   Temp 98.1 F (36.7 C) (Oral)   Resp 16   Ht '5\' 8"'  (1.727 m)   Wt 244 lb (110.7 kg)   SpO2 99%   BMI 37.10 kg/m  Wt Readings from Last 3 Encounters:  10/24/19 244 lb (110.7 kg)  10/21/19 244 lb (110.7 kg)  09/18/19 248 lb (112.5 kg)     Lab Results  Component Value Date   WBC 7.4 05/06/2019   HGB 14.3 05/06/2019   HCT 42.9 05/06/2019   PLT 216.0 05/06/2019   GLUCOSE 122 (H) 05/06/2019   CHOL 156 12/11/2018   TRIG 100.0 12/11/2018   HDL 55.90 12/11/2018   LDLCALC 80 12/11/2018   ALT 15 05/06/2019   AST 19 05/06/2019   NA 137 05/06/2019   K 4.5 05/06/2019   CL 102 05/06/2019   CREATININE 0.87 05/06/2019   BUN 15 05/06/2019   CO2 31 05/06/2019   TSH 3.15 05/06/2019   INR 2.0 (H) 10/08/2019   HGBA1C 5.9 05/06/2019   MICROALBUR <0.7 11/14/2017    DG Hand Complete Left  Result Date: 10/21/2019 CLINICAL DATA:  Fall.  Swelling and pain. EXAM: LEFT HAND - COMPLETE 3+ VIEW COMPARISON:  No recent. FINDINGS: Comminuted angulated  fracture noted the base of the left first metacarpal. Nondisplaced radial styloid fracture noted. Diffuse degenerative change noted. Diffuse osteopenia noted. No radiopaque foreign body. IMPRESSION: Comminuted angulated fracture of the base of the left first metacarpal. Nondisplaced radial styloid fracture noted. Electronically Signed   By: Marcello Moores  Register   On: 10/21/2019 06:53       Assessment & Plan:   Problem List Items Addressed This Visit    Wrist fracture    Recent fall.  Evaluated in ER.  Fracture.  Splint.  Seeing ortho next week.        Relevant Orders   Ambulatory referral to Home Health   Weakness    Some leg weakness as outlined.  Discussed leg strengthening and core strengthening - home health PT.  She is in agreement.        Relevant Orders   Ambulatory referral to Home Health   Type 2 diabetes mellitus without complications ([place])    Low carb diet and exercise. Follow met b and a1c.   Lab Results  Component Value Date   HGBA1C 5.9 05/06/2019        Relevant Orders   Hemoglobin M0N   Basic metabolic panel   Hypertension    Blood pressure doing well.  Continue amlodipine and losartan.  Follow pressures.  Follow metabolic panel.       Hypercholesteremia    On simvastatin.  Low cholesterol diet and exercise.  Follow lipid panel and liver function tests.        Relevant Orders   Hepatic function panel   Chronic anticoagulation (Coumadin)    Just restarted coumadin.  Schedule f/u pt/inr check.        Relevant Orders  Protime-INR   Change in bowel movement    Was having problems with diarrhea.  Now some diarrhea.  Discussed citrucel.            Einar Pheasant, MD

## 2019-11-02 ENCOUNTER — Encounter: Payer: Self-pay | Admitting: Internal Medicine

## 2019-11-02 DIAGNOSIS — R198 Other specified symptoms and signs involving the digestive system and abdomen: Secondary | ICD-10-CM | POA: Insufficient documentation

## 2019-11-02 DIAGNOSIS — R531 Weakness: Secondary | ICD-10-CM | POA: Insufficient documentation

## 2019-11-02 DIAGNOSIS — S62109A Fracture of unspecified carpal bone, unspecified wrist, initial encounter for closed fracture: Secondary | ICD-10-CM | POA: Insufficient documentation

## 2019-11-02 NOTE — Assessment & Plan Note (Signed)
Was having problems with diarrhea.  Now some diarrhea.  Discussed citrucel.

## 2019-11-02 NOTE — Assessment & Plan Note (Signed)
Low carb diet and exercise. Follow met b and a1c.   Lab Results  Component Value Date   HGBA1C 5.9 05/06/2019

## 2019-11-02 NOTE — Assessment & Plan Note (Signed)
Just restarted coumadin.  Schedule f/u pt/inr check.

## 2019-11-02 NOTE — Assessment & Plan Note (Signed)
Recent fall.  Evaluated in ER.  Fracture.  Splint.  Seeing ortho next week.

## 2019-11-02 NOTE — Assessment & Plan Note (Signed)
On simvastatin.  Low cholesterol diet and exercise.  Follow lipid panel and liver function tests.   

## 2019-11-02 NOTE — Assessment & Plan Note (Signed)
Some leg weakness as outlined.  Discussed leg strengthening and core strengthening - home health PT.  She is in agreement.

## 2019-11-02 NOTE — Assessment & Plan Note (Signed)
Blood pressure doing well.  Continue amlodipine and losartan.  Follow pressures.  Follow metabolic panel.  

## 2019-11-03 ENCOUNTER — Other Ambulatory Visit: Payer: Medicare Other

## 2019-11-04 ENCOUNTER — Other Ambulatory Visit: Payer: Self-pay

## 2019-11-04 ENCOUNTER — Other Ambulatory Visit (INDEPENDENT_AMBULATORY_CARE_PROVIDER_SITE_OTHER): Payer: Medicare Other

## 2019-11-04 DIAGNOSIS — E7800 Pure hypercholesterolemia, unspecified: Secondary | ICD-10-CM

## 2019-11-04 DIAGNOSIS — E119 Type 2 diabetes mellitus without complications: Secondary | ICD-10-CM

## 2019-11-04 DIAGNOSIS — Z7901 Long term (current) use of anticoagulants: Secondary | ICD-10-CM

## 2019-11-04 LAB — HEPATIC FUNCTION PANEL
ALT: 10 U/L (ref 0–35)
AST: 14 U/L (ref 0–37)
Albumin: 3.9 g/dL (ref 3.5–5.2)
Alkaline Phosphatase: 60 U/L (ref 39–117)
Bilirubin, Direct: 0.1 mg/dL (ref 0.0–0.3)
Total Bilirubin: 0.7 mg/dL (ref 0.2–1.2)
Total Protein: 6.4 g/dL (ref 6.0–8.3)

## 2019-11-04 LAB — HEMOGLOBIN A1C: Hgb A1c MFr Bld: 5.8 % (ref 4.6–6.5)

## 2019-11-04 LAB — BASIC METABOLIC PANEL
BUN: 14 mg/dL (ref 6–23)
CO2: 26 mEq/L (ref 19–32)
Calcium: 9.5 mg/dL (ref 8.4–10.5)
Chloride: 102 mEq/L (ref 96–112)
Creatinine, Ser: 0.81 mg/dL (ref 0.40–1.20)
GFR: 67.76 mL/min (ref >60.00)
Glucose, Bld: 119 mg/dL — ABNORMAL HIGH (ref 70–99)
Potassium: 4.1 mEq/L (ref 3.5–5.1)
Sodium: 135 mEq/L (ref 135–145)

## 2019-11-04 LAB — PROTIME-INR
INR: 2.2 ratio — ABNORMAL HIGH (ref 0.8–1.0)
Prothrombin Time: 24.5 s — ABNORMAL HIGH (ref 9.6–13.1)

## 2019-11-05 DIAGNOSIS — S62202A Unspecified fracture of first metacarpal bone, left hand, initial encounter for closed fracture: Secondary | ICD-10-CM | POA: Diagnosis not present

## 2019-11-06 DIAGNOSIS — I4891 Unspecified atrial fibrillation: Secondary | ICD-10-CM | POA: Diagnosis not present

## 2019-11-06 DIAGNOSIS — E119 Type 2 diabetes mellitus without complications: Secondary | ICD-10-CM | POA: Diagnosis not present

## 2019-11-06 DIAGNOSIS — Z86711 Personal history of pulmonary embolism: Secondary | ICD-10-CM | POA: Diagnosis not present

## 2019-11-06 DIAGNOSIS — R531 Weakness: Secondary | ICD-10-CM | POA: Diagnosis not present

## 2019-11-06 DIAGNOSIS — Z853 Personal history of malignant neoplasm of breast: Secondary | ICD-10-CM | POA: Diagnosis not present

## 2019-11-06 DIAGNOSIS — S52515D Nondisplaced fracture of left radial styloid process, subsequent encounter for closed fracture with routine healing: Secondary | ICD-10-CM | POA: Diagnosis not present

## 2019-11-06 DIAGNOSIS — I10 Essential (primary) hypertension: Secondary | ICD-10-CM | POA: Diagnosis not present

## 2019-11-06 DIAGNOSIS — R2681 Unsteadiness on feet: Secondary | ICD-10-CM | POA: Diagnosis not present

## 2019-11-06 DIAGNOSIS — Z9181 History of falling: Secondary | ICD-10-CM | POA: Diagnosis not present

## 2019-11-06 DIAGNOSIS — S62232D Other displaced fracture of base of first metacarpal bone, left hand, subsequent encounter for fracture with routine healing: Secondary | ICD-10-CM | POA: Diagnosis not present

## 2019-11-06 DIAGNOSIS — Z9221 Personal history of antineoplastic chemotherapy: Secondary | ICD-10-CM | POA: Diagnosis not present

## 2019-11-11 ENCOUNTER — Ambulatory Visit: Payer: Medicare Other | Admitting: Pain Medicine

## 2019-11-12 DIAGNOSIS — S52515D Nondisplaced fracture of left radial styloid process, subsequent encounter for closed fracture with routine healing: Secondary | ICD-10-CM | POA: Diagnosis not present

## 2019-11-12 DIAGNOSIS — S62232D Other displaced fracture of base of first metacarpal bone, left hand, subsequent encounter for fracture with routine healing: Secondary | ICD-10-CM | POA: Diagnosis not present

## 2019-11-12 DIAGNOSIS — I4891 Unspecified atrial fibrillation: Secondary | ICD-10-CM | POA: Diagnosis not present

## 2019-11-12 DIAGNOSIS — I10 Essential (primary) hypertension: Secondary | ICD-10-CM | POA: Diagnosis not present

## 2019-11-12 DIAGNOSIS — R531 Weakness: Secondary | ICD-10-CM | POA: Diagnosis not present

## 2019-11-12 DIAGNOSIS — E119 Type 2 diabetes mellitus without complications: Secondary | ICD-10-CM | POA: Diagnosis not present

## 2019-11-13 DIAGNOSIS — E119 Type 2 diabetes mellitus without complications: Secondary | ICD-10-CM | POA: Diagnosis not present

## 2019-11-13 DIAGNOSIS — I10 Essential (primary) hypertension: Secondary | ICD-10-CM | POA: Diagnosis not present

## 2019-11-13 DIAGNOSIS — S62232D Other displaced fracture of base of first metacarpal bone, left hand, subsequent encounter for fracture with routine healing: Secondary | ICD-10-CM | POA: Diagnosis not present

## 2019-11-13 DIAGNOSIS — S52515D Nondisplaced fracture of left radial styloid process, subsequent encounter for closed fracture with routine healing: Secondary | ICD-10-CM | POA: Diagnosis not present

## 2019-11-13 DIAGNOSIS — I4891 Unspecified atrial fibrillation: Secondary | ICD-10-CM | POA: Diagnosis not present

## 2019-11-13 DIAGNOSIS — R531 Weakness: Secondary | ICD-10-CM | POA: Diagnosis not present

## 2019-11-14 DIAGNOSIS — R531 Weakness: Secondary | ICD-10-CM | POA: Diagnosis not present

## 2019-11-14 DIAGNOSIS — S52515D Nondisplaced fracture of left radial styloid process, subsequent encounter for closed fracture with routine healing: Secondary | ICD-10-CM | POA: Diagnosis not present

## 2019-11-14 DIAGNOSIS — E119 Type 2 diabetes mellitus without complications: Secondary | ICD-10-CM | POA: Diagnosis not present

## 2019-11-14 DIAGNOSIS — S62232D Other displaced fracture of base of first metacarpal bone, left hand, subsequent encounter for fracture with routine healing: Secondary | ICD-10-CM | POA: Diagnosis not present

## 2019-11-14 DIAGNOSIS — I10 Essential (primary) hypertension: Secondary | ICD-10-CM | POA: Diagnosis not present

## 2019-11-14 DIAGNOSIS — I4891 Unspecified atrial fibrillation: Secondary | ICD-10-CM | POA: Diagnosis not present

## 2019-11-17 DIAGNOSIS — I4891 Unspecified atrial fibrillation: Secondary | ICD-10-CM | POA: Diagnosis not present

## 2019-11-17 DIAGNOSIS — R531 Weakness: Secondary | ICD-10-CM | POA: Diagnosis not present

## 2019-11-17 DIAGNOSIS — E119 Type 2 diabetes mellitus without complications: Secondary | ICD-10-CM | POA: Diagnosis not present

## 2019-11-17 DIAGNOSIS — S52515D Nondisplaced fracture of left radial styloid process, subsequent encounter for closed fracture with routine healing: Secondary | ICD-10-CM | POA: Diagnosis not present

## 2019-11-17 DIAGNOSIS — S62232D Other displaced fracture of base of first metacarpal bone, left hand, subsequent encounter for fracture with routine healing: Secondary | ICD-10-CM | POA: Diagnosis not present

## 2019-11-17 DIAGNOSIS — I10 Essential (primary) hypertension: Secondary | ICD-10-CM | POA: Diagnosis not present

## 2019-11-18 ENCOUNTER — Ambulatory Visit
Admission: RE | Admit: 2019-11-18 | Discharge: 2019-11-18 | Disposition: A | Discharge status: Home / Self Care | Payer: Medicare Other | Source: Ambulatory Visit | Attending: Pain Medicine | Admitting: Pain Medicine

## 2019-11-18 ENCOUNTER — Encounter: Payer: Self-pay | Admitting: Pain Medicine

## 2019-11-18 ENCOUNTER — Other Ambulatory Visit: Payer: Self-pay

## 2019-11-18 ENCOUNTER — Ambulatory Visit (HOSPITAL_BASED_OUTPATIENT_CLINIC_OR_DEPARTMENT_OTHER): Payer: Medicare Other | Admitting: Pain Medicine

## 2019-11-18 VITALS — BP 125/57 | HR 71 | Temp 97.1°F | Resp 18 | Ht 68.0 in | Wt 241.0 lb

## 2019-11-18 DIAGNOSIS — G8918 Other acute postprocedural pain: Secondary | ICD-10-CM | POA: Insufficient documentation

## 2019-11-18 DIAGNOSIS — M47817 Spondylosis without myelopathy or radiculopathy, lumbosacral region: Secondary | ICD-10-CM

## 2019-11-18 DIAGNOSIS — M545 Low back pain: Secondary | ICD-10-CM

## 2019-11-18 DIAGNOSIS — M47816 Spondylosis without myelopathy or radiculopathy, lumbar region: Secondary | ICD-10-CM

## 2019-11-18 DIAGNOSIS — M5137 Other intervertebral disc degeneration, lumbosacral region: Secondary | ICD-10-CM | POA: Insufficient documentation

## 2019-11-18 DIAGNOSIS — Z7901 Long term (current) use of anticoagulants: Secondary | ICD-10-CM | POA: Diagnosis not present

## 2019-11-18 DIAGNOSIS — G8929 Other chronic pain: Secondary | ICD-10-CM

## 2019-11-18 DIAGNOSIS — I9789 Other postprocedural complications and disorders of the circulatory system, not elsewhere classified: Secondary | ICD-10-CM | POA: Insufficient documentation

## 2019-11-18 MED ORDER — LIDOCAINE HCL 2 % IJ SOLN
20.0000 mL | Freq: Once | INTRAMUSCULAR | Status: AC
  Administered 2019-11-18: 400 mg

## 2019-11-18 MED ORDER — HYDROCODONE-ACETAMINOPHEN 5-325 MG PO TABS: 1.0000 | ORAL_TABLET | Freq: Four times a day (QID) | ORAL | 0 refills | Status: AC | PRN

## 2019-11-18 MED ORDER — MIDAZOLAM HCL 5 MG/5ML IJ SOLN
1.0000 mg | INTRAMUSCULAR | Status: DC | PRN
  Administered 2019-11-18: 1.5 mg via INTRAVENOUS
  Filled 2019-11-18: qty 5

## 2019-11-18 MED ORDER — HYDROCODONE-ACETAMINOPHEN 5-325 MG PO TABS: 1.0000 | ORAL_TABLET | Freq: Four times a day (QID) | ORAL | 0 refills | Status: DC | PRN

## 2019-11-18 MED ORDER — GLYCOPYRROLATE 0.2 MG/ML IJ SOLN
0.2000 mg | Freq: Once | INTRAMUSCULAR | Status: AC
  Administered 2019-11-18: 0.2 mg via INTRAVENOUS

## 2019-11-18 MED ORDER — TRIAMCINOLONE ACETONIDE 40 MG/ML IJ SUSP
40.0000 mg | Freq: Once | INTRAMUSCULAR | Status: AC
  Administered 2019-11-18: 40 mg
  Filled 2019-11-18: qty 1

## 2019-11-18 MED ORDER — LACTATED RINGERS IV SOLN
1000.0000 mL | Freq: Once | INTRAVENOUS | Status: AC
  Administered 2019-11-18: 1000 mL via INTRAVENOUS

## 2019-11-18 MED ORDER — ROPIVACAINE HCL 2 MG/ML IJ SOLN
9.0000 mL | Freq: Once | INTRAMUSCULAR | Status: AC
  Administered 2019-11-18: 9 mL via PERINEURAL
  Filled 2019-11-18: qty 10

## 2019-11-18 MED ORDER — FENTANYL CITRATE (PF) 100 MCG/2ML IJ SOLN
25.0000 ug | INTRAMUSCULAR | Status: DC | PRN
  Administered 2019-11-18: 25 ug via INTRAVENOUS
  Filled 2019-11-18: qty 2

## 2019-11-18 NOTE — Progress Notes (Signed)
PROVIDER NOTE: Information contained herein reflects review and annotations entered in association with encounter. Interpretation of such information and data should be left to medically-trained personnel. Information provided to patient can be located elsewhere in the medical record under "Patient Instructions". Document created using STT-dictation technology, any transcriptional errors that may result from process are unintentional.    Patient: Anita Walker  Service Category: Procedure  Provider: Gaspar Cola, MD  DOB: 1940/11/02  DOS: 11/18/2019  Location: [facility]  MRN: [id number]  Setting: Ambulatory - outpatient  Referring Provider: Milinda Pointer, MD  Type: Established Patient  Specialty: Interventional Pain Management  PCP: Einar Pheasant, MD   Primary Reason for Visit: Interventional Pain Management Treatment. CC: Back Pain (left, lower)  Procedure:          Anesthesia, Analgesia, Anxiolysis:  Type: Thermal Lumbar Facet, Medial Branch Radiofrequency Ablation/Neurotomy  #1 (fast-track) Primary Purpose: Therapeutic Region: Posterolateral Lumbosacral Spine Level: L2, L3, L4, L5, & S1 Medial Branch Level(s). These levels will denervate the L3-4, L4-5, and the L5-S1 lumbar facet joints. Laterality: Left  Type: Moderate (Conscious) Sedation combined with Local Anesthesia Indication(s): Analgesia and Anxiety Route: Intravenous (IV) IV Access: Secured Sedation: Meaningful verbal contact was maintained at all times during the procedure  Local Anesthetic: Lidocaine 1-2%  Position: Prone   Indications: 1. Lumbar facet syndrome (Bilateral) (R>L)   2. Spondylosis without myelopathy or radiculopathy, lumbosacral region   3. Lumbar facet arthropathy (Multilevel)   4. DDD (degenerative disc disease), lumbosacral   5. Chronic low back pain (Primary Area of Pain) (Bilateral) (R>L) w/o sciatica   6. Chronic anticoagulation (Coumadin)    [name] has been  dealing with the above chronic pain for longer than three months and has either failed to respond, was unable to tolerate, or simply did not get enough benefit from other more conservative therapies including, but not limited to: 1. Over-the-counter medications 2. Anti-inflammatory medications 3. Muscle relaxants 4. Membrane stabilizers 5. Opioids 6. Physical therapy and/or chiropractic manipulation 7. Modalities (Heat, ice, etc.) 8. Invasive techniques such as nerve blocks. [name] has attained more than 50% relief of the pain from a series of diagnostic injections conducted in separate occasions.  Pain Score: Pre-procedure: 7 /10 Post-procedure: 0-No pain/10  Pre-op Assessment:  [name] is a 79 y.o. (year old), female patient, seen today for interventional treatment. She  has a past surgical history that includes Cataract extraction w/PHACO (Right, 12/26/2016); Cataract extraction w/PHACO (Left, 01/23/2017); Breast lumpectomy (03/06/1990); and Colonoscopy with propofol (N/A, 07/08/2019). [name] has a current medication list which includes the following prescription(s): amlodipine, contour test, diphenhydramine, ferosul, losartan, multivitamin with minerals, pantoprazole, simvastatin, warfarin, hydrocodone-acetaminophen, and [START ON 11/25/2019] hydrocodone-acetaminophen, and the following Facility-Administered Medications: fentanyl and midazolam. Her primarily concern today is the Back Pain (left, lower)  Initial Vital Signs:  Pulse/HCG Rate: 71ECG Heart Rate: 83 Temp: (!) 97.4 F (36.3 C) Resp: 16 BP: 124/65 SpO2: 96 %  BMI: Estimated body mass index is 36.64 kg/m as calculated from the following:   Height as of this encounter: 5\' 8"  (1.727 m).   Weight as of this encounter: 241 lb (109.3 kg).  Risk Assessment: Allergies: Reviewed. She is allergic to penicillins and penicillin v potassium.  Allergy Precautions: None required Coagulopathies: Reviewed. None identified.    Blood-thinner therapy: None at this time Active Infection(s): Reviewed. None identified. [name] is afebrile  Site Confirmation: [name] was asked to confirm the procedure and laterality before marking  the site Procedure checklist: Completed Consent: Before the procedure and under the influence of no sedative(s), amnesic(s), or anxiolytics, the patient was informed of the treatment options, risks and possible complications. To fulfill our ethical and legal obligations, as recommended by the American Medical Association's Code of Ethics, I have informed the patient of my clinical impression; the nature and purpose of the treatment or procedure; the risks, benefits, and possible complications of the intervention; the alternatives, including doing nothing; the risk(s) and benefit(s) of the alternative treatment(s) or procedure(s); and the risk(s) and benefit(s) of doing nothing. The patient was provided information about the general risks and possible complications associated with the procedure. These may include, but are not limited to: failure to achieve desired goals, infection, bleeding, organ or nerve damage, allergic reactions, paralysis, and death. In addition, the patient was informed of those risks and complications associated to Spine-related procedures, such as failure to decrease pain; infection (i.e.: Meningitis, epidural or intraspinal abscess); bleeding (i.e.: epidural hematoma, subarachnoid hemorrhage, or any other type of intraspinal or peri-dural bleeding); organ or nerve damage (i.e.: Any type of peripheral nerve, nerve root, or spinal cord injury) with subsequent damage to sensory, motor, and/or autonomic systems, resulting in permanent pain, numbness, and/or weakness of one or several areas of the body; allergic reactions; (i.e.: anaphylactic reaction); and/or death. Furthermore, the patient was informed of those risks and complications associated with the medications. These  include, but are not limited to: allergic reactions (i.e.: anaphylactic or anaphylactoid reaction(s)); adrenal axis suppression; blood sugar elevation that in diabetics may result in ketoacidosis or comma; water retention that in patients with history of congestive heart failure may result in shortness of breath, pulmonary edema, and decompensation with resultant heart failure; weight gain; swelling or edema; medication-induced neural toxicity; particulate matter embolism and blood vessel occlusion with resultant organ, and/or nervous system infarction; and/or aseptic necrosis of one or more joints. Finally, the patient was informed that Medicine is not an exact science; therefore, there is also the possibility of unforeseen or unpredictable risks and/or possible complications that may result in a catastrophic outcome. The patient indicated having understood very clearly. We have given the patient no guarantees and we have made no promises. Enough time was given to the patient to ask questions, all of which were answered to the patient's satisfaction. [name] has indicated that she wanted to continue with the procedure. Attestation: I, the ordering provider, attest that I have discussed with the patient the benefits, risks, side-effects, alternatives, likelihood of achieving goals, and potential problems during recovery for the procedure that I have provided informed consent. Date   Time: 11/18/2019  9:52 AM  Pre-Procedure Preparation:  Monitoring: As per clinic protocol. Respiration, ETCO2, SpO2, BP, heart rate and rhythm monitor placed and checked for adequate function Safety Precautions: Patient was assessed for positional comfort and pressure points before starting the procedure. Time-out: I initiated and conducted the "Time-out" before starting the procedure, as per protocol. The patient was asked to participate by confirming the accuracy of the "Time Out" information. Verification of the correct  person, site, and procedure were performed and confirmed by me, the nursing staff, and the patient. "Time-out" conducted as per Joint Commission's Universal Protocol (UP.01.01.01). Time: 1037  Description of Procedure:          Laterality: Left Levels:  L2, L3, L4, L5, & S1 Medial Branch Level(s), at the L3-4, L4-5, and the L5-S1 lumbar facet joints. Area Prepped: Lumbosacral DuraPrep (Iodine Povacrylex [0.7% available iodine]  and Isopropyl Alcohol, 74% w/w) Safety Precautions: Aspiration looking for blood return was conducted prior to all injections. At no point did we inject any substances, as a needle was being advanced. Before injecting, the patient was told to immediately notify me if she was experiencing any new onset of "ringing in the ears, or metallic taste in the mouth". No attempts were made at seeking any paresthesias. Safe injection practices and needle disposal techniques used. Medications properly checked for expiration dates. SDV (single dose vial) medications used. After the completion of the procedure, all disposable equipment used was discarded in the proper designated medical waste containers. Local Anesthesia: Protocol guidelines were followed. The patient was positioned over the fluoroscopy table. The area was prepped in the usual manner. The time-out was completed. The target area was identified using fluoroscopy. A 12-in long, straight, sterile hemostat was used with fluoroscopic guidance to locate the targets for each level blocked. Once located, the skin was marked with an approved surgical skin marker. Once all sites were marked, the skin (epidermis, dermis, and hypodermis), as well as deeper tissues (fat, connective tissue and muscle) were infiltrated with a small amount of a short-acting local anesthetic, loaded on a 10cc syringe with a 25G, 1.5-in  Needle. An appropriate amount of time was allowed for local anesthetics to take effect before proceeding to the next step. Local  Anesthetic: Lidocaine 2.0% The unused portion of the local anesthetic was discarded in the proper designated containers. Technical explanation of process:  Radiofrequency Ablation (RFA) L2 Medial Branch Nerve RFA: The target area for the L2 medial branch is at the junction of the postero-lateral aspect of the superior articular process and the superior, posterior, and medial edge of the transverse process of L3. Under fluoroscopic guidance, a Radiofrequency needle was inserted until contact was made with os over the superior postero-lateral aspect of the pedicular shadow (target area). Sensory and motor testing was conducted to properly adjust the position of the needle. Once satisfactory placement of the needle was achieved, the numbing solution was slowly injected after negative aspiration for blood. 2.0 mL of the nerve block solution was injected without difficulty or complication. After waiting for at least 3 minutes, the ablation was performed. Once completed, the needle was removed intact. L3 Medial Branch Nerve RFA: The target area for the L3 medial branch is at the junction of the postero-lateral aspect of the superior articular process and the superior, posterior, and medial edge of the transverse process of L4. Under fluoroscopic guidance, a Radiofrequency needle was inserted until contact was made with os over the superior postero-lateral aspect of the pedicular shadow (target area). Sensory and motor testing was conducted to properly adjust the position of the needle. Once satisfactory placement of the needle was achieved, the numbing solution was slowly injected after negative aspiration for blood. 2.0 mL of the nerve block solution was injected without difficulty or complication. After waiting for at least 3 minutes, the ablation was performed. Once completed, the needle was removed intact. L4 Medial Branch Nerve RFA: The target area for the L4 medial branch is at the junction of the  postero-lateral aspect of the superior articular process and the superior, posterior, and medial edge of the transverse process of L5. Under fluoroscopic guidance, a Radiofrequency needle was inserted until contact was made with os over the superior postero-lateral aspect of the pedicular shadow (target area). Sensory and motor testing was conducted to properly adjust the position of the needle. Once satisfactory placement of the  needle was achieved, the numbing solution was slowly injected after negative aspiration for blood. 2.0 mL of the nerve block solution was injected without difficulty or complication. After waiting for at least 3 minutes, the ablation was performed. Once completed, the needle was removed intact. L5 Medial Branch Nerve RFA: The target area for the L5 medial branch is at the junction of the postero-lateral aspect of the superior articular process of S1 and the superior, posterior, and medial edge of the sacral ala. Under fluoroscopic guidance, a Radiofrequency needle was inserted until contact was made with os over the superior postero-lateral aspect of the pedicular shadow (target area). Sensory and motor testing was conducted to properly adjust the position of the needle. Once satisfactory placement of the needle was achieved, the numbing solution was slowly injected after negative aspiration for blood. 2.0 mL of the nerve block solution was injected without difficulty or complication. After waiting for at least 3 minutes, the ablation was performed. Once completed, the needle was removed intact. S1 Medial Branch Nerve RFA: The target area for the S1 medial branch is located inferior to the junction of the S1 superior articular process and the L5 inferior articular process, posterior, inferior, and lateral to the 6 o'clock position of the L5-S1 facet joint, just superior to the S1 posterior foramen. Under fluoroscopic guidance, the Radiofrequency needle was advanced until contact was made  with os over the Target area. Sensory and motor testing was conducted to properly adjust the position of the needle. Once satisfactory placement of the needle was achieved, the numbing solution was slowly injected after negative aspiration for blood. 2.0 mL of the nerve block solution was injected without difficulty or complication. After waiting for at least 3 minutes, the ablation was performed. Once completed, the needle was removed intact. Radiofrequency lesioning (ablation):  Radiofrequency Generator: NeuroTherm [id number] Sensory Stimulation Parameters: 50 Hz was used to locate & identify the nerve, making sure that the needle was positioned such that there was no sensory stimulation below 0.3 V or above 0.7 V. Motor Stimulation Parameters: 2 Hz was used to evaluate the motor component. Care was taken not to lesion any nerves that demonstrated motor stimulation of the lower extremities at an output of less than 2.5 times that of the sensory threshold, or a maximum of 2.0 V. Lesioning Technique Parameters: Standard Radiofrequency settings. (Not bipolar or pulsed.) Temperature Settings: 80 degrees C Lesioning time: 60 seconds Intra-operative Compliance: Compliant Materials & Medications: Needle(s) (Electrode/Cannula) Type: Teflon-coated, curved tip, Radiofrequency needle(s) Gauge: 20G Length: 15cm Numbing solution: 0.2% PF-Ropivacaine + Triamcinolone (40 mg/mL) diluted to a final concentration of 4 mg of Triamcinolone/mL of Ropivacaine The unused portion of the solution was discarded in the proper designated containers.  Once the entire procedure was completed, the treated area was cleaned, making sure to leave some of the prepping solution back to take advantage of its long term bactericidal properties.  Illustration of the posterior view of the lumbar spine and the posterior neural structures. Laminae of L2 through S1 are labeled. DPRL5, dorsal primary ramus of L5; DPRS1, dorsal primary ramus of  S1; DPR3, dorsal primary ramus of L3; FJ, facet (zygapophyseal) joint L3-L4; I, inferior articular process of L4; LB1, lateral branch of dorsal primary ramus of L1; IAB, inferior articular branches from L3 medial branch (supplies L4-L5 facet joint); IBP, intermediate branch plexus; MB3, medial branch of dorsal primary ramus of L3; NR3, third lumbar nerve root; S, superior articular process of L5; SAB, superior articular branches from  L4 (supplies L4-5 facet joint also); TP3, transverse process of L3.  Vitals:   11/18/19 1113 11/18/19 1120 11/18/19 1130 11/18/19 1140  BP:  (!) 122/55 (!) 125/57   Pulse:      Resp:  17 19 18   Temp:  (!) 97.2 F (36.2 C)  (!) 97.1 F (36.2 C)  TempSrc:      SpO2: 98% 99% 98% 98%  Weight:      Height:       Start Time: 1037 hrs. End Time: 1112 hrs.  Imaging Guidance (Spinal):          Type of Imaging Technique: Fluoroscopy Guidance (Spinal) Indication(s): Assistance in needle guidance and placement for procedures requiring needle placement in or near specific anatomical locations not easily accessible without such assistance. Exposure Time: Please see nurses notes. Contrast: None used. Fluoroscopic Guidance: I was personally present during the use of fluoroscopy. "Tunnel Vision Technique" used to obtain the best possible view of the target area. Parallax error corrected before commencing the procedure. "Direction-depth-direction" technique used to introduce the needle under continuous pulsed fluoroscopy. Once target was reached, antero-posterior, oblique, and lateral fluoroscopic projection used confirm needle placement in all planes. Images permanently stored in EMR. Interpretation: No contrast injected. I personally interpreted the imaging intraoperatively. Adequate needle placement confirmed in multiple planes. Permanent images saved into the patient's record.  Antibiotic Prophylaxis:   Anti-infectives (From admission, onward)   None     Indication(s):  None identified  Post-operative Assessment:  Post-procedure Vital Signs:  Pulse/HCG Rate: 7168 Temp: (!) 97.1 F (36.2 C) Resp: 18 BP: (!) 125/57 SpO2: 98 %  EBL: None  Complications: No immediate post-treatment complications observed by team, or reported by patient.  Note: The patient tolerated the entire procedure well. A repeat set of vitals were taken after the procedure and the patient was kept under observation following institutional policy, for this type of procedure. Post-procedural neurological assessment was performed, showing return to baseline, prior to discharge. The patient was provided with post-procedure discharge instructions, including a section on how to identify potential problems. Should any problems arise concerning this procedure, the patient was given instructions to immediately contact us, at any time, without hesitation. In any case, we plan to contact the patient by telephone for a follow-up status report regarding this interventional procedure.  Comments:  No additional relevant information.  Plan of Care  Orders:  Orders Placed This Encounter  Procedures   Radiofrequency,Lumbar    Scheduling Instructions:     Side(s): Left-sided     Level: L3-4, L4-5, & L5-S1 Facets (L2, L3, L4, L5, & S1 Medial Branch Nerves)     Sedation: Patient's choice.     Timeframe: Today    Order Specific Question:   Where will this procedure be performed?    Answer:   [facility]   DG [facility] C-ARM 1-60 MIN NO REPORT    Intraoperative interpretation by procedural physician at [place].    Standing Status:   Standing    Number of Occurrences:   1    Order Specific Question:   Reason for exam:    Answer:   Assistance in needle guidance and placement for procedures requiring needle placement in or near specific anatomical locations not easily accessible without such assistance.   Informed Consent Details: Physician/Practitioner Attestation;  Transcribe to consent form and obtain patient signature    Nursing Order: Transcribe to consent form and obtain patient signature. Note: Always confirm laterality of  pain with [name], before procedure. Procedure: Lumbar Facet Radiofrequency Ablation Indication/Reason: Low Back Pain, with our without leg pain, due to Facet Joint Arthralgia (Joint Pain) known as Lumbar Facet Syndrome, secondary to Lumbar, and/or Lumbosacral Spondylosis (Arthritis of the Spine), without myelopathy or radiculopathy (Nerve Damage). Provider Attestation: I, [place] Dossie Arbour, MD, (Pain Management Specialist), the physician/practitioner, attest that I have discussed with the patient the benefits, risks, side effects, alternatives, likelihood of achieving goals and potential problems during recovery for the procedure that I have provided informed consent.   Care order/instruction: Please confirm that the patient has stopped the Coumadin (Warfarin) X 5 days prior to procedure or surgery.    Please confirm that the patient has stopped the Coumadin (Warfarin) X 5 days prior to procedure or surgery.    Standing Status:   Standing    Number of Occurrences:   1   Provide equipment / supplies at bedside    "Radiofrequency Tray"; Large hemostat (x1); Small hemostat (x1); Towels (x8); 4x4 sterile sponge pack (x1) Needle type: Teflon-coated Radiofrequency Needle (Disposable   single use) Size: Long Quantity: 5    Standing Status:   Standing    Number of Occurrences:   1    Order Specific Question:   Specify    Answer:   Radiofrequency Tray   Bleeding precautions    Standing Status:   Standing    Number of Occurrences:   1   Chronic Opioid Analgesic:  None.  Highest recorded MME/day: 25 mg/day MME/day: 0 mg/day   Medications ordered for procedure: Meds ordered this encounter  Medications   lidocaine (XYLOCAINE) 2 % (with pres) injection 400 mg   lactated ringers infusion 1,000 mL   midazolam (VERSED) 5  MG/5ML injection 1-2 mg    Make sure Flumazenil is available in the pyxis when using this medication. If oversedation occurs, administer 0.2 mg IV over 15 sec. If after 45 sec no response, administer 0.2 mg again over 1 min; may repeat at 1 min intervals; not to exceed 4 doses (1 mg)   fentaNYL (SUBLIMAZE) injection 25-50 mcg    Make sure Narcan is available in the pyxis when using this medication. In the event of respiratory depression (RR< 8/min): Titrate NARCAN (naloxone) in increments of 0.1 to 0.2 mg IV at 2-3 minute intervals, until desired degree of reversal.   triamcinolone acetonide (KENALOG-40) injection 40 mg   ropivacaine (PF) 2 mg/mL (0.2%) (NAROPIN) injection 9 mL   HYDROcodone-acetaminophen (NORCO/VICODIN) 5-325 MG tablet    Sig: Take 1 tablet by mouth every 6 (six) hours as needed for up to 7 days for severe pain. Must last 7 days.    Dispense:  28 tablet    Refill:  0    For acute post-operative pain. Not to be refilled. Must last 7 days.   HYDROcodone-acetaminophen (NORCO/VICODIN) 5-325 MG tablet    Sig: Take 1 tablet by mouth every 6 (six) hours as needed for up to 7 days for severe pain. Must last 7 days.    Dispense:  28 tablet    Refill:  0    For acute post-operative pain. Not to be refilled.  Must last 7 days.   glycopyrrolate (ROBINUL) injection 0.2 mg   Medications administered: We administered lidocaine, lactated ringers, midazolam, fentaNYL, triamcinolone acetonide, ropivacaine (PF) 2 mg/mL (0.2%), and glycopyrrolate.  See the medical record for exact dosing, route, and time of administration.  Follow-up plan:   Return in about 6 weeks (around 12/30/2019)  for (VV), (PP) Follow-up.       Interventional treatment options: Planned, scheduled, and/or pending:   None at this time.   Under consideration:  NOTE: COUMADIN ANTICOAGULATION (Stop: 5 days   Restart: 2 hrs) (NO MORE RFA - Poor intra-op compliance) history of vasovagal with IV access. Diagnostic  right-sided vs bilateral lumbar facet block #2    Therapeutic/palliative (PRN):   Palliative bilateral lumbar facet block #3  Therapeutic right lumbar facet RFA (done 09/18/2019) (NO MORE RFA - Poor intra-op compliance)  Therapeutic left lumbar facet RFA (done 11/18/2019) (NO MORE RFA - Unreliable testing feedback)     Recent Visits Date Type Provider Dept  10/06/19 Telemedicine Milinda Pointer, MD [facility]  09/18/19 Procedure visit Milinda Pointer, MD [facility]  08/28/19 Telemedicine Milinda Pointer, MD [facility]  Showing recent visits within past 90 days and meeting all other requirements Today's Visits Date Type Provider Dept  11/18/19 Procedure visit Milinda Pointer, MD [facility]  Showing today's visits and meeting all other requirements Future Appointments Date Type Provider Dept  12/31/19 Appointment Milinda Pointer, MD [facility]  Showing future appointments within next 90 days and meeting all other requirements  Disposition: Discharge home  Discharge (Date   Time): 11/18/2019; 1148 hrs.   Primary Care Physician: Einar Pheasant, MD Location: [facility] Note by: Gaspar Cola, MD Date: 11/18/2019; Time: 12:08 PM  Disclaimer:  Medicine is not an Chief Strategy Officer. The only guarantee in medicine is that nothing is guaranteed. It is important to note that the decision to proceed with this intervention was based on the information collected from the patient. The Data and conclusions were drawn from the patient's questionnaire, the interview, and the physical examination. Because the information was provided in large part by the patient, it cannot be guaranteed that it has not been purposely or unconsciously manipulated. Every effort has been made to obtain as much relevant data as possible for this evaluation. It is important to note that the conclusions that lead to this procedure  are derived in large part from the available data. Always take into account that the treatment will also be dependent on availability of resources and existing treatment guidelines, considered by other Pain Management Practitioners as being common knowledge and practice, at the time of the intervention. For Medico-Legal purposes, it is also important to point out that variation in procedural techniques and pharmacological choices are the acceptable norm. The indications, contraindications, technique, and results of the above procedure should only be interpreted and judged by a Board-Certified Interventional Pain Specialist with extensive familiarity and expertise in the same exact procedure and technique.

## 2019-11-18 NOTE — Progress Notes (Signed)
Safety precautions to be maintained throughout the outpatient stay will include: orient to surroundings, keep bed in low position, maintain call bell within reach at all times, provide assistance with transfer out of bed and ambulation.  

## 2019-11-18 NOTE — Patient Instructions (Signed)

## 2019-11-19 ENCOUNTER — Telehealth: Payer: Self-pay

## 2019-11-19 NOTE — Telephone Encounter (Signed)
Post procedure phone call.  LM 

## 2019-11-20 ENCOUNTER — Other Ambulatory Visit: Payer: Self-pay

## 2019-11-20 ENCOUNTER — Ambulatory Visit (INDEPENDENT_AMBULATORY_CARE_PROVIDER_SITE_OTHER): Payer: Medicare Other | Admitting: Urology

## 2019-11-20 DIAGNOSIS — N3941 Urge incontinence: Secondary | ICD-10-CM

## 2019-11-21 DIAGNOSIS — S52515A Nondisplaced fracture of left radial styloid process, initial encounter for closed fracture: Secondary | ICD-10-CM | POA: Diagnosis not present

## 2019-11-21 DIAGNOSIS — S62202A Unspecified fracture of first metacarpal bone, left hand, initial encounter for closed fracture: Secondary | ICD-10-CM | POA: Diagnosis not present

## 2019-11-24 ENCOUNTER — Other Ambulatory Visit: Payer: Self-pay

## 2019-11-24 ENCOUNTER — Encounter: Payer: Self-pay | Admitting: Podiatry

## 2019-11-24 ENCOUNTER — Ambulatory Visit (INDEPENDENT_AMBULATORY_CARE_PROVIDER_SITE_OTHER): Payer: Medicare Other | Admitting: Podiatry

## 2019-11-24 DIAGNOSIS — D689 Coagulation defect, unspecified: Secondary | ICD-10-CM | POA: Diagnosis not present

## 2019-11-24 DIAGNOSIS — B351 Tinea unguium: Secondary | ICD-10-CM | POA: Diagnosis not present

## 2019-11-24 DIAGNOSIS — M79674 Pain in right toe(s): Secondary | ICD-10-CM

## 2019-11-24 DIAGNOSIS — M79675 Pain in left toe(s): Secondary | ICD-10-CM | POA: Diagnosis not present

## 2019-11-24 DIAGNOSIS — E1142 Type 2 diabetes mellitus with diabetic polyneuropathy: Secondary | ICD-10-CM | POA: Diagnosis not present

## 2019-11-24 NOTE — Progress Notes (Signed)
This patient returns to my office for at risk foot care.  This patient requires this care by a professional since this patient will be at risk due to having type 2 diabetes and coagulation defect   Patient is taking coumadin. This patient is unable to cut nails herself since the patient cannot reach her nails.These nails are painful walking and wearing shoes.  This patient presents for at risk foot care today.  General Appearance  Alert, conversant and in no acute stress.  Vascular  Dorsalis pedis and posterior tibial  pulses are palpable  bilaterally.  Capillary return is within normal limits  bilaterally. Temperature is within normal limits  bilaterally.  Neurologic  Senn-Weinstein monofilament wire test diminished   bilaterally. Muscle power within normal limits bilaterally.  Nails Thick disfigured discolored nails with subungual debris  from hallux to fifth toes bilaterally. No evidence of bacterial infection or drainage bilaterally.  Orthopedic  No limitations of motion  feet .  No crepitus or effusions noted.  No bony pathology or digital deformities noted.  Skin  normotropic skin with no porokeratosis noted bilaterally.  No signs of infections or ulcers noted.     Onychomycosis  Pain in right toes  Pain in left toes  Consent was obtained for treatment procedures.   Mechanical debridement of nails 1-5  bilaterally performed with a nail nipper.  Filed with dremel without incident.    Return office visit    10 weeks                  Told patient to return for periodic foot care and evaluation due to potential at risk complications.   Aine Strycharz DPM  

## 2019-11-26 ENCOUNTER — Other Ambulatory Visit: Payer: Self-pay | Admitting: Internal Medicine

## 2019-11-26 ENCOUNTER — Other Ambulatory Visit (INDEPENDENT_AMBULATORY_CARE_PROVIDER_SITE_OTHER): Payer: Medicare Other

## 2019-11-26 ENCOUNTER — Other Ambulatory Visit: Payer: Self-pay

## 2019-11-26 DIAGNOSIS — S62232D Other displaced fracture of base of first metacarpal bone, left hand, subsequent encounter for fracture with routine healing: Secondary | ICD-10-CM | POA: Diagnosis not present

## 2019-11-26 DIAGNOSIS — I10 Essential (primary) hypertension: Secondary | ICD-10-CM | POA: Diagnosis not present

## 2019-11-26 DIAGNOSIS — I4891 Unspecified atrial fibrillation: Secondary | ICD-10-CM | POA: Diagnosis not present

## 2019-11-26 DIAGNOSIS — S52515D Nondisplaced fracture of left radial styloid process, subsequent encounter for closed fracture with routine healing: Secondary | ICD-10-CM | POA: Diagnosis not present

## 2019-11-26 DIAGNOSIS — Z7901 Long term (current) use of anticoagulants: Secondary | ICD-10-CM | POA: Diagnosis not present

## 2019-11-26 DIAGNOSIS — E119 Type 2 diabetes mellitus without complications: Secondary | ICD-10-CM | POA: Diagnosis not present

## 2019-11-26 DIAGNOSIS — R531 Weakness: Secondary | ICD-10-CM | POA: Diagnosis not present

## 2019-11-26 LAB — PROTIME-INR
INR: 1.9 ratio — ABNORMAL HIGH (ref 0.8–1.0)
Prothrombin Time: 20.7 s — ABNORMAL HIGH (ref 9.6–13.1)

## 2019-11-27 ENCOUNTER — Other Ambulatory Visit: Payer: Self-pay | Admitting: Internal Medicine

## 2019-11-27 DIAGNOSIS — E119 Type 2 diabetes mellitus without complications: Secondary | ICD-10-CM | POA: Diagnosis not present

## 2019-11-27 DIAGNOSIS — I10 Essential (primary) hypertension: Secondary | ICD-10-CM | POA: Diagnosis not present

## 2019-11-27 DIAGNOSIS — I4891 Unspecified atrial fibrillation: Secondary | ICD-10-CM | POA: Diagnosis not present

## 2019-11-27 DIAGNOSIS — R531 Weakness: Secondary | ICD-10-CM | POA: Diagnosis not present

## 2019-11-27 DIAGNOSIS — Z7901 Long term (current) use of anticoagulants: Secondary | ICD-10-CM

## 2019-11-27 DIAGNOSIS — S62232D Other displaced fracture of base of first metacarpal bone, left hand, subsequent encounter for fracture with routine healing: Secondary | ICD-10-CM | POA: Diagnosis not present

## 2019-11-27 DIAGNOSIS — S52515D Nondisplaced fracture of left radial styloid process, subsequent encounter for closed fracture with routine healing: Secondary | ICD-10-CM | POA: Diagnosis not present

## 2019-11-27 NOTE — Progress Notes (Signed)
PTNS  Session # monthly maintenance  Health & Social Factors: None Caffeine: 0 Alcohol: 0 Daytime voids #per day: 14 Night-time voids #per night: 3 Urgency: mild Incontinence Episodes #per day: 2 Ankle used: right Treatment Setting: 18 Feeling/ Response: sensory Comments: Patient tolerated the procedure well  Performed By: Marella Chimes, PA-C   Follow Up: One month

## 2019-11-27 NOTE — Progress Notes (Signed)
Order placed for f/u pt/inr

## 2019-11-28 ENCOUNTER — Other Ambulatory Visit: Payer: Self-pay

## 2019-11-28 ENCOUNTER — Telehealth: Payer: Self-pay | Admitting: Internal Medicine

## 2019-11-28 MED ORDER — AMLODIPINE BESYLATE 2.5 MG PO TABS: ORAL_TABLET | ORAL | 1 refills | Status: DC

## 2019-11-28 NOTE — Telephone Encounter (Signed)
Spoke with patient. Confirmed she is okay. She did not take a hard fall. She said she felt like her feet stuck to the floor or something and she went down on her knees. Patient did not hit any thing or hit her head. Confirmed no new injuries, no bleeding. Only injuries from previous fall. Patient stated EMTs advised her to be seen by her doctor because she is on blood thinners. Advised that her INR was checked this week and was ok. We are going to check again in 10 days. Told patient that Dr Nicki Reaper has a full schedule today and she stated she was doing what the EMTs told her to do. Pt has someone sitting with her at home today.

## 2019-11-28 NOTE — Telephone Encounter (Signed)
Patient scheduled.

## 2019-11-28 NOTE — Telephone Encounter (Signed)
Patient fell this morning at 7:30am. EMT's where called to house. They check patient out everything is ok but want her checked out by her provider.

## 2019-11-28 NOTE — Telephone Encounter (Signed)
Per discussion, no head injury.  No LOC.  No significant pain now.  Can schedule f/u appt.  If any acute change, needs to be evaluated.

## 2019-12-02 ENCOUNTER — Other Ambulatory Visit: Payer: Self-pay

## 2019-12-02 ENCOUNTER — Ambulatory Visit (INDEPENDENT_AMBULATORY_CARE_PROVIDER_SITE_OTHER): Payer: Medicare Other | Admitting: Internal Medicine

## 2019-12-02 ENCOUNTER — Ambulatory Visit: Payer: Medicare Other

## 2019-12-02 ENCOUNTER — Ambulatory Visit
Admission: RE | Admit: 2019-12-02 | Discharge: 2019-12-02 | Disposition: A | Discharge status: Home / Self Care | Payer: Medicare Other | Source: Ambulatory Visit | Attending: Internal Medicine | Admitting: Internal Medicine

## 2019-12-02 VITALS — BP 122/70 | HR 76 | Temp 97.3°F | Resp 16 | Wt 237.0 lb

## 2019-12-02 DIAGNOSIS — W19XXXA Unspecified fall, initial encounter: Secondary | ICD-10-CM | POA: Diagnosis not present

## 2019-12-02 DIAGNOSIS — S52515D Nondisplaced fracture of left radial styloid process, subsequent encounter for closed fracture with routine healing: Secondary | ICD-10-CM | POA: Diagnosis not present

## 2019-12-02 DIAGNOSIS — I4891 Unspecified atrial fibrillation: Secondary | ICD-10-CM

## 2019-12-02 DIAGNOSIS — S62232D Other displaced fracture of base of first metacarpal bone, left hand, subsequent encounter for fracture with routine healing: Secondary | ICD-10-CM | POA: Diagnosis not present

## 2019-12-02 DIAGNOSIS — Z23 Encounter for immunization: Secondary | ICD-10-CM | POA: Diagnosis not present

## 2019-12-02 DIAGNOSIS — Z9221 Personal history of antineoplastic chemotherapy: Secondary | ICD-10-CM

## 2019-12-02 DIAGNOSIS — S0993XA Unspecified injury of face, initial encounter: Secondary | ICD-10-CM | POA: Diagnosis not present

## 2019-12-02 DIAGNOSIS — I10 Essential (primary) hypertension: Secondary | ICD-10-CM | POA: Diagnosis not present

## 2019-12-02 DIAGNOSIS — S0990XA Unspecified injury of head, initial encounter: Secondary | ICD-10-CM | POA: Insufficient documentation

## 2019-12-02 DIAGNOSIS — R2681 Unsteadiness on feet: Secondary | ICD-10-CM

## 2019-12-02 DIAGNOSIS — Z86711 Personal history of pulmonary embolism: Secondary | ICD-10-CM

## 2019-12-02 DIAGNOSIS — Z1231 Encounter for screening mammogram for malignant neoplasm of breast: Secondary | ICD-10-CM

## 2019-12-02 DIAGNOSIS — Z9181 History of falling: Secondary | ICD-10-CM

## 2019-12-02 DIAGNOSIS — E7800 Pure hypercholesterolemia, unspecified: Secondary | ICD-10-CM | POA: Diagnosis not present

## 2019-12-02 DIAGNOSIS — R531 Weakness: Secondary | ICD-10-CM | POA: Diagnosis not present

## 2019-12-02 DIAGNOSIS — S62109D Fracture of unspecified carpal bone, unspecified wrist, subsequent encounter for fracture with routine healing: Secondary | ICD-10-CM

## 2019-12-02 DIAGNOSIS — E1142 Type 2 diabetes mellitus with diabetic polyneuropathy: Secondary | ICD-10-CM

## 2019-12-02 DIAGNOSIS — E119 Type 2 diabetes mellitus without complications: Secondary | ICD-10-CM

## 2019-12-02 DIAGNOSIS — Z853 Personal history of malignant neoplasm of breast: Secondary | ICD-10-CM

## 2019-12-02 DIAGNOSIS — K130 Diseases of lips: Secondary | ICD-10-CM | POA: Diagnosis not present

## 2019-12-02 DIAGNOSIS — Z7901 Long term (current) use of anticoagulants: Secondary | ICD-10-CM

## 2019-12-02 DIAGNOSIS — I6782 Cerebral ischemia: Secondary | ICD-10-CM | POA: Diagnosis not present

## 2019-12-02 DIAGNOSIS — G319 Degenerative disease of nervous system, unspecified: Secondary | ICD-10-CM | POA: Diagnosis not present

## 2019-12-02 DIAGNOSIS — G9389 Other specified disorders of brain: Secondary | ICD-10-CM | POA: Diagnosis not present

## 2019-12-02 LAB — CBC WITH DIFFERENTIAL/PLATELET
Basophils Absolute: 0.1 10*3/uL (ref 0.0–0.1)
Basophils Relative: 0.7 % (ref 0.0–3.0)
Eosinophils Absolute: 0.1 10*3/uL (ref 0.0–0.7)
Eosinophils Relative: 1.1 % (ref 0.0–5.0)
HCT: 41.9 % (ref 36.0–46.0)
Hemoglobin: 14.1 g/dL (ref 12.0–15.0)
Lymphocytes Relative: 17.3 % (ref 12.0–46.0)
Lymphs Abs: 1.6 10*3/uL (ref 0.7–4.0)
MCHC: 33.8 g/dL (ref 30.0–36.0)
MCV: 91.8 fl (ref 78.0–100.0)
Monocytes Absolute: 1.4 10*3/uL — ABNORMAL HIGH (ref 0.1–1.0)
Monocytes Relative: 15.7 % — ABNORMAL HIGH (ref 3.0–12.0)
Neutro Abs: 6 10*3/uL (ref 1.4–7.7)
Neutrophils Relative %: 65.2 % (ref 43.0–77.0)
Platelets: 238 10*3/uL (ref 150.0–400.0)
RBC: 4.56 Mil/uL (ref 3.87–5.11)
RDW: 14.6 % (ref 11.5–15.5)
WBC: 9.2 10*3/uL (ref 4.0–10.5)

## 2019-12-02 LAB — PROTIME-INR
INR: 2.8 ratio — ABNORMAL HIGH (ref 0.8–1.0)
Prothrombin Time: 31.4 s — ABNORMAL HIGH (ref 9.6–13.1)

## 2019-12-02 MED ORDER — ACYCLOVIR 400 MG PO TABS: 400.0000 mg | ORAL_TABLET | Freq: Three times a day (TID) | ORAL | 0 refills | Status: DC

## 2019-12-02 NOTE — Progress Notes (Signed)
[name], female   DOB: 05/07/1940, 79 y.o.   MRN: [id number]   Subjective:    [name], female    DOB: 09/12/1940, 79 y.o.   MRN: [id number]  HPI This visit occurred during the SARS-CoV-2 public health emergency.  Safety protocols were in place, including screening questions prior to the visit, additional usage of staff PPE, and extensive cleaning of exam room while observing appropriate contact time as indicated for disinfecting solutions.  Patient here for work in appt.  Received call - pt fell 11/28/19.  EMTs called to house.  Checked pt and ok.  Per phone message, her shoes stuck to the floor.  Went down on her knees.  No active bleeding. Initial message states - no head injury.  On presentation today, she is bruised - face - adjacent to her eye and around left side forehead and face.  States she fell forward - feet felt like stuck to floor.  No headache.  No dizziness.  She is on coumadin.  No chest pain or sob.  No acid reflux.  No abdominal pain.  Bowels moving.  No hip or back injury from fall.  Overall she feels things are stable.  Is concerned regarding lip irritation.    Past Medical History:  Diagnosis Date  . Anemia   . Arthritis   . Atrial fibrillation ([place])   . Breast cancer ([place])    s/p lumpectomy 1992.  s/p chemo and xrt left breast  . Diabetes mellitus ([place])   . Edema    feet/legs  . Gastric ulcer   . GERD (gastroesophageal reflux disease)   . HOH (hard of hearing)    aides  . Hypercholesterolemia   . Hypertension   . Pulmonary emboli [facility])    Past Surgical History:  Procedure Laterality Date  . BREAST LUMPECTOMY  03/06/1990   left breast  . CATARACT EXTRACTION W/PHACO Right 12/26/2016   Procedure: CATARACT EXTRACTION PHACO AND INTRAOCULAR LENS PLACEMENT (IOC)-RIGHT DIABETIC;  Surgeon: Birder Robson, MD;  Location: [facility] ORS;  Service: Ophthalmology;  Laterality: Right;  [place] 00:47 AP% 24.5 CDE 11.62 Fluid pack lot # [id number]    . CATARACT EXTRACTION W/PHACO Left 01/23/2017   Procedure: CATARACT EXTRACTION PHACO AND INTRAOCULAR LENS PLACEMENT (IOC);  Surgeon: Birder Robson, MD;  Location: [facility] ORS;  Service: Ophthalmology;  Laterality: Left;  [place] 00:50 AP% 16.1 CDE 8.18 Fluid pack lot #[id number]  . COLONOSCOPY WITH PROPOFOL N/A 07/08/2019   Procedure: COLONOSCOPY WITH PROPOFOL;  Surgeon: Lucilla Lame, MD;  Location: [facility] ENDOSCOPY;  Service: Endoscopy;  Laterality: N/A;   Family History  Problem Relation Age of Onset  . Heart disease Father   . Heart disease Brother        s/p CABG  . Colon cancer Neg Hx   . Breast cancer Neg Hx    Social History   Socioeconomic History  . Marital status: Widowed    Spouse name: Not on file  . Number of children: Not on file  . Years of education: Not on file  . Highest education level: Not on file  Occupational History  . Not on file  Tobacco Use  . Smoking status: Never Smoker  . Smokeless tobacco: Never Used  Vaping Use  . Vaping Use: Never used  Substance and Sexual Activity  . Alcohol use: No    Alcohol/week: 0.0 standard drinks  . Drug use: No  . Sexual activity: Never  Other Topics Concern  .  Not on file  Social History Narrative  . Not on file   Social Determinants of Health   Financial Resource Strain: Low Risk   . Difficulty of Paying Living Expenses: Not hard at all  Food Insecurity: No Food Insecurity  . Worried About Charity fundraiser in the Last Year: Never true  . Ran Out of Food in the Last Year: Never true  Transportation Needs: No Transportation Needs  . Lack of Transportation (Medical): No  . Lack of Transportation (Non-Medical): No  Physical Activity:   . Days of Exercise per Week: Not on file  . Minutes of Exercise per Session: Not on file  Stress: No Stress Concern Present  . Feeling of Stress : Not at all  Social Connections: Unknown  . Frequency of Communication with Friends and Family: More than three times a week  .  Frequency of Social Gatherings with Friends and Family: Not on file  . Attends Religious Services: Not on file  . Active Member of Clubs or Organizations: Not on file  . Attends Archivist Meetings: Not on file  . Marital Status: Not on file    Outpatient Encounter Medications as of 12/02/2019  Medication Sig  . amLODipine (NORVASC) 2.5 MG tablet TAKE 1 TABLET(2.5 MG) BY MOUTH DAILY  . CONTOUR TEST test strip USE TO TEST BLOOD SUGAR ONCE EVERY WEEK  . diphenhydrAMINE (BENADRYL) 25 mg capsule Take 25 mg at bedtime as needed by mouth for sleep.   Marland Kitchen FEROSUL 325 (65 Fe) MG tablet TAKE 1 TABLET(325 MG) BY MOUTH DAILY  . losartan (COZAAR) 100 MG tablet Take 1 tablet (100 mg total) by mouth daily.  . Multiple Vitamin (MULTIVITAMIN WITH MINERALS) TABS tablet Take 1 tablet daily by mouth.  . pantoprazole (PROTONIX) 40 MG tablet TAKE 1 TABLET(40 MG) BY MOUTH DAILY  . simvastatin (ZOCOR) 10 MG tablet TAKE 1 TABLET BY MOUTH EVERY DAY  . warfarin (COUMADIN) 2.5 MG tablet Takes 2 tablets (5 mg) on Tuesdays and Thursdays and Saturday, and 1 tablet (2.5 mg)  all other days of the week  . [DISCONTINUED] HYDROcodone-acetaminophen (NORCO/VICODIN) 5-325 MG tablet Take 1 tablet by mouth every 6 (six) hours as needed for up to 7 days for severe pain. Must last 7 days.  Marland Kitchen acyclovir (ZOVIRAX) 400 MG tablet Take 1 tablet (400 mg total) by mouth 3 (three) times daily. (Patient not taking: Reported on 12/11/2019)   No facility-administered encounter medications on file as of 12/02/2019.    Review of Systems  Constitutional: Negative for appetite change, fever and unexpected weight change.  HENT: Negative for congestion and sinus pressure.   Respiratory: Negative for cough, chest tightness and shortness of breath.   Cardiovascular: Negative for chest pain, palpitations and leg swelling.       No increased swelling.   Gastrointestinal: Negative for abdominal pain, diarrhea, nausea and vomiting.    Genitourinary: Negative for difficulty urinating and dysuria.  Musculoskeletal: Negative for joint swelling and myalgias.  Skin: Negative for color change and rash.       Bruising - left side head.    Neurological: Negative for dizziness and light-headedness.  Psychiatric/Behavioral: Negative for agitation and dysphoric mood.       Objective:    Physical Exam Vitals reviewed.  Constitutional:      General: She is not in acute distress.    Appearance: Normal appearance.  HENT:     Head: Normocephalic and atraumatic.     Right Ear: External  ear normal.     Left Ear: External ear normal.  Eyes:     General: No scleral icterus.       Right eye: No discharge.        Left eye: No discharge.     Conjunctiva/sclera: Conjunctivae normal.  Neck:     Thyroid: No thyromegaly.  Cardiovascular:     Rate and Rhythm: Normal rate and regular rhythm.  Pulmonary:     Effort: No respiratory distress.     Breath sounds: Normal breath sounds. No wheezing.  Abdominal:     General: Bowel sounds are normal.     Palpations: Abdomen is soft.     Tenderness: There is no abdominal tenderness.  Musculoskeletal:        General: No swelling or tenderness.     Cervical back: Neck supple. No tenderness.  Lymphadenopathy:     Cervical: No cervical adenopathy.  Skin:    Findings: No erythema or rash.     Comments: Bruising noted - left side face/head.    Neurological:     Mental Status: She is alert.  Psychiatric:        Mood and Affect: Mood normal.        Behavior: Behavior normal.     BP 122/70   Pulse 76   Temp (!) 97.3 F (36.3 C) (Oral)   Resp 16   Wt 237 lb (107.5 kg)   SpO2 99%   BMI 36.04 kg/m  Wt Readings from Last 3 Encounters:  12/11/19 237 lb (107.5 kg)  12/02/19 237 lb (107.5 kg)  11/18/19 241 lb (109.3 kg)     Lab Results  Component Value Date   WBC 9.2 12/02/2019   HGB 14.1 12/02/2019   HCT 41.9 12/02/2019   PLT 238.0 12/02/2019   GLUCOSE 119 (H) 11/04/2019    CHOL 156 12/11/2018   TRIG 100.0 12/11/2018   HDL 55.90 12/11/2018   LDLCALC 80 12/11/2018   ALT 10 11/04/2019   AST 14 11/04/2019   NA 135 11/04/2019   K 4.1 11/04/2019   CL 102 11/04/2019   CREATININE 0.81 11/04/2019   BUN 14 11/04/2019   CO2 26 11/04/2019   TSH 3.15 05/06/2019   INR 2.6 (H) 12/10/2019   HGBA1C 5.8 11/04/2019   MICROALBUR <0.7 11/14/2017       Assessment & Plan:   Problem List Items Addressed This Visit    Wrist fracture    Being followed by ortho.       Long term current use of anticoagulant therapy    Check pt/inr today.       Lip lesion    Question HSV - lip.  Acyclovir as directed.  Follow. Call with update.       Hypertension    Blood pressure has been doing well.  On amlodipine and losartan.  Follow pressures.  Follow metabolic panel.       Hypercholesteremia    On simvastatin.  Low cholesterol diet and exercise. Follow lipid panel and liver function tests.        Head injury    Recent fall. Hit head as outlined.  No headache or dizziness.  Bruising as outlined.  On coumadin.  Check CT head.  Follow.  Have discussed falls.  Discussed PT.  Use walker.  Follow.       Relevant Orders   CT Head Wo Contrast (Completed)   CBC with Differential/Platelet (Completed)   Fall    Discussed with her today.  Need to use walking.  Discussed PT.  Follow.       Diabetic polyneuropathy associated with type 2 diabetes mellitus (HCC)    Low carb diet and exercise. Follow met b and a1c.        Other Visit Diagnoses    Visit for screening mammogram    -  Primary   Relevant Orders   MM 3D SCREEN BREAST BILATERAL   Anticoagulated on Coumadin       Relevant Orders   Protime-INR (Completed)   Need for immunization against influenza       Relevant Orders   Flu Vaccine QUAD High Dose(Fluad) (Completed)       Einar Pheasant, MD

## 2019-12-04 ENCOUNTER — Telehealth: Payer: Self-pay | Admitting: Internal Medicine

## 2019-12-04 NOTE — Telephone Encounter (Signed)
Patient said her lips are improving but slowly. She has 3 days left of medication.

## 2019-12-04 NOTE — Telephone Encounter (Signed)
Patient called in stated that the medication that Dr.Scott prescribed for her lips did not work.

## 2019-12-08 ENCOUNTER — Telehealth: Payer: Self-pay

## 2019-12-08 NOTE — Telephone Encounter (Signed)
She fell again in the kitchen on 9/25. Went to see Dr. Nicki Reaper on 9/28 , she sent her to the hospital and had a CT on her head. Now her back is hurting really bad and she can hardly walk. They didn't check her back in the hospital because it wasn't hurting then. It didn't start hurting until yesterday.

## 2019-12-08 NOTE — Telephone Encounter (Signed)
Please schedule her an appointment with Dr Dossie Arbour.

## 2019-12-09 ENCOUNTER — Telehealth: Payer: Self-pay | Admitting: Internal Medicine

## 2019-12-09 NOTE — Telephone Encounter (Signed)
Noted  

## 2019-12-09 NOTE — Telephone Encounter (Signed)
Patient called in stated that her lips are not swollen any more have taken all the medication lips are a little dry that's all

## 2019-12-10 ENCOUNTER — Other Ambulatory Visit: Payer: Self-pay

## 2019-12-10 ENCOUNTER — Other Ambulatory Visit (INDEPENDENT_AMBULATORY_CARE_PROVIDER_SITE_OTHER): Payer: Medicare Other

## 2019-12-10 DIAGNOSIS — Z7901 Long term (current) use of anticoagulants: Secondary | ICD-10-CM | POA: Diagnosis not present

## 2019-12-10 LAB — PROTIME-INR
INR: 2.6 ratio — ABNORMAL HIGH (ref 0.8–1.0)
Prothrombin Time: 29.3 s — ABNORMAL HIGH (ref 9.6–13.1)

## 2019-12-10 NOTE — Telephone Encounter (Signed)
error 

## 2019-12-11 ENCOUNTER — Encounter: Payer: Self-pay | Admitting: Pain Medicine

## 2019-12-11 ENCOUNTER — Ambulatory Visit (HOSPITAL_BASED_OUTPATIENT_CLINIC_OR_DEPARTMENT_OTHER): Payer: Medicare Other | Admitting: Pain Medicine

## 2019-12-11 ENCOUNTER — Ambulatory Visit
Admission: RE | Admit: 2019-12-11 | Discharge: 2019-12-11 | Disposition: A | Discharge status: Home / Self Care | Payer: Medicare Other | Attending: Pain Medicine | Admitting: Pain Medicine

## 2019-12-11 ENCOUNTER — Ambulatory Visit
Admission: RE | Admit: 2019-12-11 | Discharge: 2019-12-11 | Disposition: A | Discharge status: Home / Self Care | Payer: Medicare Other | Source: Ambulatory Visit | Attending: Pain Medicine | Admitting: Pain Medicine

## 2019-12-11 VITALS — BP 137/64 | HR 74 | Temp 97.2°F | Resp 16 | Ht 69.0 in | Wt 237.0 lb

## 2019-12-11 DIAGNOSIS — M545 Low back pain, unspecified: Secondary | ICD-10-CM | POA: Insufficient documentation

## 2019-12-11 DIAGNOSIS — M4854XA Collapsed vertebra, not elsewhere classified, thoracic region, initial encounter for fracture: Secondary | ICD-10-CM | POA: Insufficient documentation

## 2019-12-11 DIAGNOSIS — M1712 Unilateral primary osteoarthritis, left knee: Secondary | ICD-10-CM | POA: Insufficient documentation

## 2019-12-11 DIAGNOSIS — Z9181 History of falling: Secondary | ICD-10-CM

## 2019-12-11 DIAGNOSIS — M4856XA Collapsed vertebra, not elsewhere classified, lumbar region, initial encounter for fracture: Secondary | ICD-10-CM | POA: Insufficient documentation

## 2019-12-11 DIAGNOSIS — M5136 Other intervertebral disc degeneration, lumbar region: Secondary | ICD-10-CM | POA: Diagnosis not present

## 2019-12-11 DIAGNOSIS — Z7901 Long term (current) use of anticoagulants: Secondary | ICD-10-CM | POA: Insufficient documentation

## 2019-12-11 DIAGNOSIS — Y929 Unspecified place or not applicable: Secondary | ICD-10-CM | POA: Insufficient documentation

## 2019-12-11 DIAGNOSIS — M47816 Spondylosis without myelopathy or radiculopathy, lumbar region: Secondary | ICD-10-CM | POA: Insufficient documentation

## 2019-12-11 DIAGNOSIS — M5117 Intervertebral disc disorders with radiculopathy, lumbosacral region: Secondary | ICD-10-CM | POA: Insufficient documentation

## 2019-12-11 DIAGNOSIS — W19XXXS Unspecified fall, sequela: Secondary | ICD-10-CM | POA: Insufficient documentation

## 2019-12-11 DIAGNOSIS — Y92009 Unspecified place in unspecified non-institutional (private) residence as the place of occurrence of the external cause: Secondary | ICD-10-CM | POA: Insufficient documentation

## 2019-12-11 DIAGNOSIS — G8929 Other chronic pain: Secondary | ICD-10-CM

## 2019-12-11 DIAGNOSIS — M5137 Other intervertebral disc degeneration, lumbosacral region: Secondary | ICD-10-CM

## 2019-12-11 DIAGNOSIS — I10 Essential (primary) hypertension: Secondary | ICD-10-CM | POA: Insufficient documentation

## 2019-12-11 DIAGNOSIS — Z79899 Other long term (current) drug therapy: Secondary | ICD-10-CM | POA: Insufficient documentation

## 2019-12-11 DIAGNOSIS — G894 Chronic pain syndrome: Secondary | ICD-10-CM | POA: Insufficient documentation

## 2019-12-11 DIAGNOSIS — M48061 Spinal stenosis, lumbar region without neurogenic claudication: Secondary | ICD-10-CM | POA: Insufficient documentation

## 2019-12-11 DIAGNOSIS — E7800 Pure hypercholesterolemia, unspecified: Secondary | ICD-10-CM | POA: Insufficient documentation

## 2019-12-11 DIAGNOSIS — E1142 Type 2 diabetes mellitus with diabetic polyneuropathy: Secondary | ICD-10-CM | POA: Insufficient documentation

## 2019-12-11 DIAGNOSIS — M19011 Primary osteoarthritis, right shoulder: Secondary | ICD-10-CM | POA: Insufficient documentation

## 2019-12-11 DIAGNOSIS — Y939 Activity, unspecified: Secondary | ICD-10-CM | POA: Insufficient documentation

## 2019-12-11 DIAGNOSIS — M2578 Osteophyte, vertebrae: Secondary | ICD-10-CM | POA: Insufficient documentation

## 2019-12-11 DIAGNOSIS — M4316 Spondylolisthesis, lumbar region: Secondary | ICD-10-CM | POA: Diagnosis not present

## 2019-12-11 NOTE — Progress Notes (Signed)
Safety precautions to be maintained throughout the outpatient stay will include: orient to surroundings, keep bed in low position, maintain call bell within reach at all times, provide assistance with transfer out of bed and ambulation.  

## 2019-12-11 NOTE — Progress Notes (Signed)
PROVIDER NOTE: Information contained herein reflects review and annotations entered in association with encounter. Interpretation of such information and data should be left to medically-trained personnel. Information provided to patient can be located elsewhere in the medical record under "Patient Instructions". Document created using STT-dictation technology, any transcriptional errors that may result from process are unintentional.    Patient: Anita Walker  Service Category: E/M  Provider: Gaspar Cola, MD  DOB: [birth year]  DOS: 12/11/2019  Specialty: Interventional Pain Management  MRN: [id number]  Setting: Ambulatory outpatient  PCP: Einar Pheasant, MD  Type: Established Patient    Referring Provider: Einar Pheasant, MD  Location: Office  Delivery: Face-to-face     Primary Reason(s) for Visit: Evaluation of chronic illnesses with exacerbation, or progression (Level of risk: moderate) CC: Back Pain (mid)  HPI  Anita Walker is a 78 y.o. year old, female patient, who comes today for a follow-up evaluation. She has Hypertension; Hypercholesteremia; History of breast cancer; Long term current use of anticoagulant therapy; Mixed emotional features as adjustment reaction; Health care maintenance; Essential hypertension; Pure hypercholesterolemia; Type 2 diabetes mellitus without complications ([place]); Cough; Right hip pain; Syncope; Urinary frequency; Diabetic polyneuropathy associated with type 2 diabetes mellitus ([place]); Abnormal chest CT; Constipation; Fall; Pain due to onychomycosis of toenails of both feet; Coagulation disorder ([place]); Compression fracture of thoracic vertebra ([place]); Chronic 50% compression fracture of L3 lumbar vertebra, sequela, w/ retropulsion; Compression fracture of thoracolumbar vertebra (T11), sequela; Lumbar facet arthropathy (Multilevel); 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, 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); L5-S1 disc-osteophyte complex (Right); Lumbar facet syndrome (Bilateral) (R>L);  Chronic anticoagulation (Coumadin); Chronic low back pain (Primary Area of Pain) (Bilateral) (R>L) w/o sciatica; Chronic pain syndrome; Spondylosis without myelopathy or radiculopathy, lumbosacral region; Positive colorectal cancer screening using Cologuard test; Polyp of descending colon; History of vasovagal symptom; DDD (degenerative disc disease), lumbosacral; Acute postoperative pain; Irritability and anger; Nausea without vomiting; Wrist fracture; Weakness; Change in bowel movement; Bradycardia following surgery; Head injury; Acute exacerbation of chronic low back pain; At high risk for falls; and Fall at home, sequela on their problem list. Anita Walker was last seen on 11/18/2019. Her primarily concern today is the Back Pain (mid)  Pain Assessment: Location: Mid Back Radiating: right hip Onset:  Less than a month ago Duration: Chronic pain Quality: Aching Severity: 4 /10 (subjective, self-reported pain score)  Effect on ADL: unable to do as much as I used to. Timing:  Constant Modifying factors: Tylenol BP: 137/64  HR: 74  This is the case of an 79 year old white female patient comes into the clinic today after having had a fall complaining of low back pain in the center of the lower back with some radiation of the pain towards the right hip. She denies any bowel bladder incontinence. She also denies any pain going below the area of the hip. She does have a prior history of vertebral body fractures and she indicates that she did not go to the emergency room to get any type of x-rays after that fall. At this point, based on her prior history of falls I am traumatic fractures and the fact that she is osteoporotic, we will start by sending the patient to have some x-rays of her lumbar spine. I will see her back next week and will look at the possibility of doing some interventional therapies, if indicated. At this point, she does not seem to be in any type of acute distress.  Pharmacotherapy  Assessment  Analgesic: None.  Highest recorded  MME/day: 25 mg/day MME/day: 0 mg/day   Monitoring: [place] PMP: PDMP reviewed during this encounter.       Pharmacotherapy: No side-effects or adverse reactions reported. Compliance: No problems identified. Effectiveness: Clinically acceptable. Plan: Refer to "POC".  UDS: No results found for: SUMMARY  Laboratory Chemistry Profile   Renal Lab Results  Component Value Date   BUN 14 11/04/2019   CREATININE 0.81 11/04/2019   GFR 67.76 11/04/2019   GFRAA >60 08/07/2018   GFRNONAA >60 08/07/2018   SPECGRAV 1.015 01/29/2017   PHUR 6.5 01/29/2017   PROTEINUR NEGATIVE 08/08/2018     Electrolytes Lab Results  Component Value Date   NA 135 11/04/2019   K 4.1 11/04/2019   CL 102 11/04/2019   CALCIUM 9.5 11/04/2019     Hepatic Lab Results  Component Value Date   AST 14 11/04/2019   ALT 10 11/04/2019   ALBUMIN 3.9 11/04/2019   ALKPHOS 60 11/04/2019     ID Lab Results  Component Value Date   SARSCOV2NAA NEGATIVE 07/04/2019     Bone No results found for: VD25OH, IZ124PY0DXI, PJ8250NL9, JQ7341PF7, 25OHVITD1, 25OHVITD2, 25OHVITD3, TESTOFREE, TESTOSTERONE   Endocrine Lab Results  Component Value Date   GLUCOSE 119 (H) 11/04/2019   GLUCOSEU NEGATIVE 08/08/2018   HGBA1C 5.8 11/04/2019   TSH 3.15 05/06/2019     Neuropathy Lab Results  Component Value Date   HGBA1C 5.8 11/04/2019     CNS No results found for: COLORCSF, APPEARCSF, RBCCOUNTCSF, WBCCSF, POLYSCSF, LYMPHSCSF, EOSCSF, PROTEINCSF, GLUCCSF, JCVIRUS, CSFOLI, IGGCSF, LABACHR, ACETBL, LABACHR, ACETBL   Inflammation (CRP: Acute  ESR: Chronic) Lab Results  Component Value Date   CRP <0.8 10/31/2016   ESRSEDRATE 5 10/31/2016   LATICACIDVEN 1.1 10/30/2016     Rheumatology No results found for: RF, ANA, LABURIC, URICUR, LYMEIGGIGMAB, LYMEABIGMQN, HLAB27   Coagulation Lab Results  Component Value Date   INR 2.6 (H) 12/10/2019   LABPROT 29.3 (H) 12/10/2019   APTT  43 (H) 11/02/2016   PLT 238.0 12/02/2019     Cardiovascular Lab Results  Component Value Date   TROPONINI <0.03 11/02/2016   HGB 14.1 12/02/2019   HCT 41.9 12/02/2019     Screening Lab Results  Component Value Date   SARSCOV2NAA NEGATIVE 07/04/2019     Cancer No results found for: CEA, CA125, LABCA2   Allergens No results found for: ALMOND, APPLE, ASPARAGUS, AVOCADO, BANANA, BARLEY, BASIL, BAYLEAF, GREENBEAN, LIMABEAN, WHITEBEAN, BEEFIGE, REDBEET, BLUEBERRY, BROCCOLI, CABBAGE, MELON, CARROT, CASEIN, CASHEWNUT, CAULIFLOWER, CELERY     Note: Lab results reviewed.  Imaging Review  Cervical Imaging: Cervical CT wo contrast: Results for orders placed during the hospital encounter of 08/13/18 CT Cervical Spine Wo Contrast  Narrative CLINICAL DATA:  Status post fall today.  Initial encounter.  EXAM: CT HEAD WITHOUT CONTRAST  CT CERVICAL SPINE WITHOUT CONTRAST  TECHNIQUE: Multidetector CT imaging of the head and cervical spine was performed following the standard protocol without intravenous contrast. Multiplanar CT image reconstructions of the cervical spine were also generated.  COMPARISON:  Head and cervical spine CT scans 08/08/2018.  FINDINGS: CT HEAD FINDINGS  Brain: No evidence of acute infarction, hemorrhage, hydrocephalus, extra-axial collection or mass lesion/mass effect. Chronic microvascular ischemic change noted.  Vascular: No hyperdense vessel or unexpected calcification.  Skull: Intact.  No focal lesion.  Sinuses/Orbits: Status post cataract surgery. Minimal mucosal thickening right sphenoid sinus noted.  Other: None.  CT CERVICAL SPINE FINDINGS  Alignment: Maintained with straightening of lordosis noted.  Skull base and vertebrae:  No acute fracture. No primary bone lesion or focal pathologic process.  Soft tissues and spinal canal: No prevertebral fluid or swelling. No visible canal hematoma.  Disc levels: Mild loss of disc space height  is most notable at C5-6.  Upper chest: Lung apices clear.  Other: None.  IMPRESSION: No acute abnormality head or cervical spine.  Chronic microvascular ischemic change.  Mild cervical degenerative disease.   Electronically Signed By: Inge Rise M.D. On: 08/13/2018 12:30  Shoulder Imaging: Shoulder-R DG: Results for orders placed during the hospital encounter of 08/13/18 DG Shoulder Right  Narrative CLINICAL DATA:  Pt reports was coming in for a visit and when she was walking [place] medical [place] she thought she was up far enough and the door hit her walker causing her to fall.  EXAM: RIGHT SHOULDER - 2+ VIEW  COMPARISON:  Chest x-ray on 10/24/2016  FINDINGS: There is no evidence of fracture or dislocation. There is no evidence of arthropathy or other focal bone abnormality. Soft tissues are unremarkable.  IMPRESSION: Negative.   Electronically Signed By: Nolon Nations M.D. On: 08/13/2018 12:50  Shoulder-L DG: Results for orders placed during the hospital encounter of 08/07/18 DG Shoulder Left  Narrative CLINICAL DATA:  Pain status post fall  EXAM: LEFT SHOULDER - 2+ VIEW  COMPARISON:  None.  FINDINGS: There is no evidence of fracture or dislocation. There is no evidence of arthropathy or other focal bone abnormality. Soft tissues are unremarkable. There is probable calcific tendinosis of the supraspinatus. Multiple surgical clips are noted in the left axilla.  IMPRESSION: No acute osseous abnormality.   Electronically Signed By: Constance Holster M.D. On: 08/08/2018 00:50  Lumbosacral Imaging: Lumbar MR wo contrast: Results for orders placed during the hospital encounter of 10/05/18 MR LUMBAR SPINE WO CONTRAST  Narrative CLINICAL DATA:  Chronic low back pain.  Fell on 08/08/2018.  EXAM: MRI LUMBAR SPINE WITHOUT CONTRAST  TECHNIQUE: Multiplanar, multisequence MR imaging of the lumbar spine  was performed. No intravenous contrast was administered.  COMPARISON:  Lumbar radiographs from 2013.  FINDINGS: Segmentation: There are five lumbar type vertebral bodies. The last full intervertebral disc space is labeled L5-S1.  Alignment:  Normal  Vertebrae: Remote L3 anterior wedge like compression fracture with mild/minimal retropulsion.  There is a subtle superior endplate compression fracture of T11 which is likely subacute. No significant compression deformity or retropulsion.  Conus medullaris and cauda equina: Conus extends to the L1-2 level. Conus and cauda equina appear normal.  Paraspinal and other soft tissues: No significant paraspinal or retroperitoneal findings. Small right renal cyst noted.  Disc levels:  T12-L1: No significant findings.  L1-2: No significant findings.  Mild facet disease.  L2-3: Bulging annulus and mild retropulsion of the superior posterior aspect of the L3 vertebral body. There is flattening of the ventral thecal sac and mild bilateral lateral recess stenosis. No foraminal stenosis. Early spinal stenosis.  L3-4: Mild retropulsion of the posteroinferior aspect of L3 along with a bulging annulus and moderate facet disease contributing to mild bilateral lateral recess stenosis. No significant spinal or foraminal stenosis.  L4-5: Moderate facet disease and mild bulging annulus but no significant spinal or foraminal stenosis.  L5-S1: Small focal central disc protrusion contacting the ventral thecal sac. No foraminal stenosis. There is a far lateral disc osteophyte complex on the right which could potentially irritate the extraforaminal right L5 nerve root. Moderate to advanced facet disease, left greater than right.  IMPRESSION: 1.  Remote L3 compression fracture with mild retropulsion. 2. Probable subacute superior endplate fracture of H99. 3. Mild bilateral lateral recess stenosis at L2-3 and L3-4 no significant foraminal stenosis.  Early spinal stenosis. 4. Small focal central disc protrusion at L5-S1. 5. Far right lateral disc osteophyte complex at L5-S1 with potential irritation of the extraforaminal right L5 nerve root.   Electronically Signed By: Marijo Sanes M.D. On: 10/05/2018 17:15  Lumbar DG 2-3 views: Results for orders placed during the hospital encounter of 10/08/18 DG Lumbar Spine 2-3 Views  Narrative CLINICAL DATA:  Fall with low back pain  EXAM: LUMBAR SPINE - 2-3 VIEW  COMPARISON:  None.  FINDINGS: There is a chronic compression fracture of L3 with approximately 50% height loss. The other vertebral body heights are maintained. There is multilevel degenerative disc disease, greatest at the lower thoracic and upper lumbar spine. Alignment is normal.  IMPRESSION: Unchanged chronic compression fracture of L3. No acute abnormality of the lumbar spine.   Electronically Signed By: Ulyses Jarred M.D. On: 10/08/2018 03:06  Lumbar DG (Complete) 4+V: Results for orders placed during the hospital encounter of 01/02/12 DG Lumbar Spine Complete  Narrative *RADIOLOGY REPORT*  Clinical Data: Back and hip pain  LUMBAR SPINE - COMPLETE 4+ VIEW  Comparison: None.  Findings: The lumbar vertebrae are in normal alignment.  There is somewhat diminished disc space at L5-S1.  Anterior osteophyte formation is noted at T12-L1 and L1-L2 levels.  No compression deformity is seen.  The SI joints appear corticated.  IMPRESSION: Normal level with diffuse degenerative change.  No acute compression deformity.  Hip Imaging: Hip-R DG 2-3 views: Results for orders placed during the hospital encounter of 08/07/18 DG Hip Unilat W or Wo Pelvis 2-3 Views Right  Narrative CLINICAL DATA:  Right hip pain  EXAM: DG HIP (WITH OR WITHOUT PELVIS) 2-3V RIGHT  COMPARISON:  None.  FINDINGS: There is no evidence of hip fracture or dislocation. There is no evidence of arthropathy or other focal bone  abnormality.  IMPRESSION: Negative.   Electronically Signed By: Constance Holster M.D. On: 08/08/2018 00:49  Knee Imaging: Knee-L DG 1-2 views: Results for orders placed in visit on 09/27/15 DG Knee 1-2 Views Left  Narrative CLINICAL DATA:  Left knee and leg pain and swelling ; no mention of injury EXAM: LEFT KNEE - 1-2 VIEW COMPARISON:  None in PACs FINDINGS: The bones are subjectively adequately mineralized. There is no acute or healing fracture. There is mild narrowing of the medial joint compartment. There is beaking of the medial tibial spine. The lateral and patellofemoral compartments are preserved in width. There is no joint effusion. IMPRESSION: Moderate degenerative change centered on the medial joint compartment. No acute fracture or dislocation. Electronically Signed By: David  Walker M.D. On: 09/27/2015 14:33  Foot Imaging: Foot-R DG Complete: Results for orders placed in visit on 01/15/13 DG Foot Complete Right  Narrative 3 views of the right foot demonstrates an osseously mature foot. Plantar calcaneal spurring as well as calcification intratendinous [place] of the Achilles. She also has hardening of the dorsalis pedis artery mild hammertoe deformities bilateral  Foot-L DG Complete: Results for orders placed in visit on 01/15/13  DG Foot Complete Left  Narrative 3 views of the left foot demonstrates an osseously mature foot with distal osteopenia early osteoarthritic changes to the first metatarsophalangeal joint and fibular sesamoid she also has some early changes to the midfoot left. An ossific in areas present other than soft tissue fat and  edema no other osseous abnormalities.  Hand Imaging: Hand-L DG Complete: Results for orders placed during the hospital encounter of 10/21/19 DG Hand Complete Left  Narrative CLINICAL DATA:  Fall.  Swelling and pain.  EXAM: LEFT HAND - COMPLETE 3+ VIEW  COMPARISON:  No recent.  FINDINGS: Comminuted  angulated fracture noted the base of the left first metacarpal. Nondisplaced radial styloid fracture noted. Diffuse degenerative change noted. Diffuse osteopenia noted. No radiopaque foreign body.  IMPRESSION: Comminuted angulated fracture of the base of the left first metacarpal. Nondisplaced radial styloid fracture noted.   Electronically Signed By: Marcello Moores  Register On: 10/21/2019 06:53  Complexity Note: Imaging results reviewed. Results shared with Anita Walker, using Layman's terms.                        Meds   Current Outpatient Medications:  .  amLODipine (NORVASC) 2.5 MG tablet, TAKE 1 TABLET(2.5 MG) BY MOUTH DAILY, Disp: 90 tablet, Rfl: 1 .  CONTOUR TEST test strip, USE TO TEST BLOOD SUGAR ONCE EVERY WEEK, Disp: 25 strip, Rfl: 1 .  diphenhydrAMINE (BENADRYL) 25 mg capsule, Take 25 mg at bedtime as needed by mouth for sleep. , Disp: , Rfl:  .  FEROSUL 325 (65 Fe) MG tablet, TAKE 1 TABLET(325 MG) BY MOUTH DAILY, Disp: 90 tablet, Rfl: 2 .  losartan (COZAAR) 100 MG tablet, Take 1 tablet (100 mg total) by mouth daily., Disp: 90 tablet, Rfl: 3 .  Multiple Vitamin (MULTIVITAMIN WITH MINERALS) TABS tablet, Take 1 tablet daily by mouth., Disp: , Rfl:  .  pantoprazole (PROTONIX) 40 MG tablet, TAKE 1 TABLET(40 MG) BY MOUTH DAILY, Disp: 90 tablet, Rfl: 1 .  simvastatin (ZOCOR) 10 MG tablet, TAKE 1 TABLET BY MOUTH EVERY DAY, Disp: 90 tablet, Rfl: 1 .  warfarin (COUMADIN) 2.5 MG tablet, Takes 2 tablets (5 mg) on Tuesdays and Thursdays and Saturday, and 1 tablet (2.5 mg)  all other days of the week, Disp: 142 tablet, Rfl: 2 .  acyclovir (ZOVIRAX) 400 MG tablet, Take 1 tablet (400 mg total) by mouth 3 (three) times daily. (Patient not taking: Reported on 12/11/2019), Disp: 15 tablet, Rfl: 0 .  HYDROcodone-acetaminophen (NORCO/VICODIN) 5-325 MG tablet, Take 1 tablet by mouth every 6 (six) hours as needed., Disp: , Rfl:   ROS  Constitutional: Denies any fever or chills Gastrointestinal: No  reported hemesis, hematochezia, vomiting, or acute GI distress Musculoskeletal: Denies any acute onset joint swelling, redness, loss of ROM, or weakness Neurological: No reported episodes of acute onset apraxia, aphasia, dysarthria, agnosia, amnesia, paralysis, loss of coordination, or loss of consciousness  Allergies  Anita Walker is allergic to penicillins and penicillin v potassium.  [place]  Drug: Anita Walker  reports no history of drug use. Alcohol:  reports no history of alcohol use. Tobacco:  reports that she has never smoked. She has never used smokeless tobacco. Medical:  has a past medical history of Anemia, Arthritis, Atrial fibrillation ([place]), Breast cancer ([place]), Diabetes mellitus ([place]), Edema, Gastric ulcer, GERD (gastroesophageal reflux disease), HOH (hard of hearing), Hypercholesterolemia, Hypertension, and Pulmonary emboli ([place]). Surgical: Anita Walker  has a past surgical history that includes Cataract extraction w/PHACO (Right, 12/26/2016); Cataract extraction w/PHACO (Left, 01/23/2017); Breast lumpectomy (03/06/1990); and Colonoscopy with propofol (N/A, 07/08/2019). Family: family history includes Heart disease in her brother and father.  Constitutional Exam  General appearance: Well nourished, well developed, and well hydrated. In no apparent acute distress Vitals:   12/11/19 1301  BP:  137/64  Pulse: 74  Resp: 16  Temp: (!) 97.2 F (36.2 C)  TempSrc: Skin  SpO2: 99%  Weight: 237 lb (107.5 kg)  Height: '5\' 9"'  (1.753 m)   BMI Assessment: Estimated body mass index is 35 kg/m as calculated from the following:   Height as of this encounter: '5\' 9"'  (1.753 m).   Weight as of this encounter: 237 lb (107.5 kg).  BMI interpretation table: BMI level Category Range association with higher incidence of chronic pain  <18 kg/m2 Underweight   18.5-24.9 kg/m2 Ideal body weight   25-29.9 kg/m2 Overweight Increased incidence by 20%  30-34.9 kg/m2 Obese (Class I) Increased incidence by 68%   35-39.9 kg/m2 Severe obesity (Class II) Increased incidence by 136%  >40 kg/m2 Extreme obesity (Class III) Increased incidence by 254%   Patient's current BMI Ideal Body weight  Body mass index is 35 kg/m. Ideal body weight: 66.2 kg (145 lb 15.1 oz) Adjusted ideal body weight: 82.7 kg (182 lb 5.9 oz)   BMI Readings from Last 4 Encounters:  12/11/19 35.00 kg/m  12/02/19 36.04 kg/m  11/18/19 36.64 kg/m  10/24/19 37.10 kg/m   Wt Readings from Last 4 Encounters:  12/11/19 237 lb (107.5 kg)  12/02/19 237 lb (107.5 kg)  11/18/19 241 lb (109.3 kg)  10/24/19 244 lb (110.7 kg)    Psych/Mental status: Alert, oriented x 3 (person, place, & time)       Eyes: PERLA Respiratory: No evidence of acute respiratory distress  Assessment   Status Diagnosis  Controlled Controlled Controlled 1. Chronic pain syndrome   2. Chronic low back pain (Primary Area of Pain) (Bilateral) (R>L) w/o sciatica   3. DDD (degenerative disc disease), lumbosacral   4. Lumbar facet syndrome (Bilateral) (R>L)   5. Acute exacerbation of chronic low back pain   6. At high risk for falls   7. Fall at home, sequela      Updated Problems: Problem  Acute Exacerbation of Chronic Low Back Pain  At High Risk for Falls  Fall At Home, Sequela    Plan of Care  Pharmacotherapy (Medications Ordered): No orders of the defined types were placed in this encounter.  Medications administered today: Anita Ann R. Walker had no medications administered during this visit.  Orders:  Orders Placed This Encounter  Procedures  . DG Lumbar Spine Complete W/Bend    Patient presents with axial pain with possible radicular component.  In addition to any acute findings, please report on:  1. Facet (Zygapophyseal) joint DJD (Hypertrophy, space narrowing, subchondral sclerosis, and/or osteophyte formation) 2. DDD and/or IVDD (Loss of disc height, desiccation or "Black disc disease") 3. Pars defects 4. Spondylolisthesis,  spondylosis, and/or spondyloarthropathies (include Degree/Grade of displacement in mm) 5. Vertebral body Fractures, including age (old, new/acute) 56. Modic Type Changes 7. Demineralization 8. Bone pathology 9. Central, Lateral Recess, and/or Foraminal Stenosis (include AP diameter of stenosis in mm) 10. Surgical changes (hardware type, status, and presence of fibrosis)  NOTE: Please specify level(s) and laterality. If applicable: Please indicate ROM and/or evidence of instability (>73m displacement between flexion and extension views)    Standing Status:   Future    Number of Occurrences:   1    Standing Expiration Date:   01/11/2020    Scheduling Instructions:     Imaging must be done as soon as possible. Inform patient that order will expire within 30 days and I will not renew it.    Order Specific Question:   Reason  for Exam (SYMPTOM  OR DIAGNOSIS REQUIRED)    Answer:   Low back pain    Order Specific Question:   Preferred imaging location?    Answer:   [facility]    Order Specific Question:   Call Results- Best Contact Number?    Answer:   [phone] ([facility])    Order Specific Question:   Radiology Contrast Protocol - do NOT remove file path    Answer:   [email or web address]    Order Specific Question:   Release to patient    Answer:   Immediate   Lab Orders  No laboratory test(s) ordered today    Imaging Orders     DG Lumbar Spine Complete W/Bend Referral Orders  No referral(s) requested today   Planned follow-up:   Return in about 1 week (around 12/18/2019) for (F2F), (s/p Tests).      Interventional treatment options: Planned, scheduled, and/or pending:   None at this time.   Under consideration:  NOTE: COUMADIN ANTICOAGULATION (Stop: 5 days  Restart: 2 hrs) (NO MORE RFA - Poor intra-op compliance) history of vasovagal with IV access. Diagnostic right-sided vs bilateral lumbar facet block #2     Therapeutic/palliative (PRN):   Palliative bilateral lumbar facet block #3  Therapeutic right lumbar facet RFA (done 09/18/2019) (NO MORE RFA - Poor intra-op compliance)  Therapeutic left lumbar facet RFA (done 11/18/2019) (NO MORE RFA - Unreliable testing feedback)     Recent Visits Date Type Provider Dept  11/18/19 Procedure visit Milinda Pointer, MD [facility]  10/06/19 Telemedicine Milinda Pointer, MD [facility]  09/18/19 Procedure visit Milinda Pointer, MD [facility]  Showing recent visits within past 90 days and meeting all other requirements Today's Visits Date Type Provider Dept  12/11/19 Office Visit Milinda Pointer, MD [facility]  Showing today's visits and meeting all other requirements Future Appointments Date Type Provider Dept  12/18/19 Appointment Milinda Pointer, MD [facility]  Showing future appointments within next 90 days and meeting all other requirements  Primary Care Physician: Einar Pheasant, MD Location: [facility] Note by: Gaspar Cola, MD Date: 12/11/2019; Time: 2:36 PM  Note: This dictation was prepared with Dragon dictation. Any transcriptional errors that may result from this process are unintentional.

## 2019-12-14 ENCOUNTER — Encounter: Payer: Self-pay | Admitting: Internal Medicine

## 2019-12-14 DIAGNOSIS — K130 Diseases of lips: Secondary | ICD-10-CM | POA: Insufficient documentation

## 2019-12-14 NOTE — Assessment & Plan Note (Signed)
Discussed with her today. Need to use walking.  Discussed PT.  Follow.

## 2019-12-14 NOTE — Assessment & Plan Note (Signed)
Check pt/inr today.  

## 2019-12-14 NOTE — Assessment & Plan Note (Signed)
Recent fall. Hit head as outlined.  No headache or dizziness.  Bruising as outlined.  On coumadin.  Check CT head.  Follow.  Have discussed falls.  Discussed PT.  Use walker.  Follow.

## 2019-12-14 NOTE — Assessment & Plan Note (Signed)
On simvastatin.  Low cholesterol diet and exercise.  Follow lipid panel and liver function tests.   

## 2019-12-14 NOTE — Assessment & Plan Note (Signed)
Blood pressure has been doing well.  On amlodipine and losartan.  Follow pressures.  Follow metabolic panel.

## 2019-12-14 NOTE — Assessment & Plan Note (Signed)
Question HSV - lip.  Acyclovir as directed.  Follow. Call with update.

## 2019-12-14 NOTE — Assessment & Plan Note (Signed)
Being followed by ortho.   

## 2019-12-14 NOTE — Assessment & Plan Note (Signed)
Low carb diet and exercise. Follow met b and a1c.  

## 2019-12-15 ENCOUNTER — Encounter: Payer: Self-pay | Admitting: Internal Medicine

## 2019-12-15 NOTE — Telephone Encounter (Signed)
See pt/inr lab result note.  If having persistent symptoms and valtrex not helping - would like for dermatology to evaluate.  If agreeable, let me know and I will place the order for the referral.

## 2019-12-15 NOTE — Telephone Encounter (Signed)
Patient finished the valtrex that was sent in and now says lips are not any better. Seemed to be getting better while on the medication and then not any better once she finished. Patient was wondering if there was something else she should try or if she needs more of the valtrex.

## 2019-12-15 NOTE — Telephone Encounter (Signed)
PT called and said that her lips aren't getting any better

## 2019-12-15 NOTE — Telephone Encounter (Signed)
See result note.  

## 2019-12-16 ENCOUNTER — Other Ambulatory Visit: Payer: Self-pay

## 2019-12-16 ENCOUNTER — Ambulatory Visit (INDEPENDENT_AMBULATORY_CARE_PROVIDER_SITE_OTHER): Payer: Medicare Other | Admitting: Internal Medicine

## 2019-12-16 DIAGNOSIS — K130 Diseases of lips: Secondary | ICD-10-CM

## 2019-12-16 DIAGNOSIS — I10 Essential (primary) hypertension: Secondary | ICD-10-CM

## 2019-12-16 DIAGNOSIS — E119 Type 2 diabetes mellitus without complications: Secondary | ICD-10-CM

## 2019-12-16 MED ORDER — DENAVIR 1 % EX CREA: TOPICAL_CREAM | CUTANEOUS | 0 refills | Status: DC

## 2019-12-16 NOTE — Telephone Encounter (Signed)
See result note.  

## 2019-12-16 NOTE — Telephone Encounter (Signed)
Patient called in stated that her lips are not any better wanted to know what to do

## 2019-12-16 NOTE — Progress Notes (Signed)
[name], female   DOB: 1940/07/01, 79 y.o.   MRN: [id number]   Subjective:    [name], female    DOB: Jan 20, 1941, 79 y.o.   MRN: [id number]  HPI This visit occurred during the SARS-CoV-2 public health emergency.  Safety protocols were in place, including screening questions prior to the visit, additional usage of staff PPE, and extensive cleaning of exam room while observing appropriate contact time as indicated for disinfecting solutions.  Patient here for work in appt - to f/u regarding her lips.  Was recently seen and reported lip irritation with lesion lip.  Question of HSV.  Treated with acyclovir.  She called in for reevaluation.  States lips - not resolved.  Reports did feel like medication helped previously.  Not sure now - may feel worse.  No fever. No sore throat.  Eating and drinking well.  No headache.   Past Medical History:  Diagnosis Date  . Anemia   . Arthritis   . Atrial fibrillation ([place])   . Breast cancer ([place])    s/p lumpectomy 1992.  s/p chemo and xrt left breast  . Diabetes mellitus ([place])   . Edema    feet/legs  . Gastric ulcer   . GERD (gastroesophageal reflux disease)   . HOH (hard of hearing)    aides  . Hypercholesterolemia   . Hypertension   . Pulmonary emboli [facility])    Past Surgical History:  Procedure Laterality Date  . BREAST LUMPECTOMY  03/06/1990   left breast  . CATARACT EXTRACTION W/PHACO Right 12/26/2016   Procedure: CATARACT EXTRACTION PHACO AND INTRAOCULAR LENS PLACEMENT (IOC)-RIGHT DIABETIC;  Surgeon: Birder Robson, MD;  Location: [facility] ORS;  Service: Ophthalmology;  Laterality: Right;  [place] 00:47 AP% 24.5 CDE 11.62 Fluid pack lot # [id number]  . CATARACT EXTRACTION W/PHACO Left 01/23/2017   Procedure: CATARACT EXTRACTION PHACO AND INTRAOCULAR LENS PLACEMENT (IOC);  Surgeon: Birder Robson, MD;  Location: [facility] ORS;  Service: Ophthalmology;  Laterality: Left;  [place] 00:50 AP% 16.1 CDE 8.18 Fluid pack lot  #[id number]  . COLONOSCOPY WITH PROPOFOL N/A 07/08/2019   Procedure: COLONOSCOPY WITH PROPOFOL;  Surgeon: Lucilla Lame, MD;  Location: [facility] ENDOSCOPY;  Service: Endoscopy;  Laterality: N/A;   Family History  Problem Relation Age of Onset  . Heart disease Father   . Heart disease Brother        s/p CABG  . Colon cancer Neg Hx   . Breast cancer Neg Hx    Social History   Socioeconomic History  . Marital status: Widowed    Spouse name: Not on file  . Number of children: Not on file  . Years of education: Not on file  . Highest education level: Not on file  Occupational History  . Not on file  Tobacco Use  . Smoking status: Never Smoker  . Smokeless tobacco: Never Used  Vaping Use  . Vaping Use: Never used  Substance and Sexual Activity  . Alcohol use: No    Alcohol/week: 0.0 standard drinks  . Drug use: No  . Sexual activity: Never  Other Topics Concern  . Not on file  Social History Narrative  . Not on file   Social Determinants of Health   Financial Resource Strain: Low Risk   . Difficulty of Paying Living Expenses: Not hard at all  Food Insecurity: No Food Insecurity  . Worried About Charity fundraiser in the Last Year: Never true  .  Ran Out of Food in the Last Year: Never true  Transportation Needs: No Transportation Needs  . Lack of Transportation (Medical): No  . Lack of Transportation (Non-Medical): No  Physical Activity:   . Days of Exercise per Week: Not on file  . Minutes of Exercise per Session: Not on file  Stress: No Stress Concern Present  . Feeling of Stress : Not at all  Social Connections: Unknown  . Frequency of Communication with Friends and Family: More than three times a week  . Frequency of Social Gatherings with Friends and Family: Not on file  . Attends Religious Services: Not on file  . Active Member of Clubs or Organizations: Not on file  . Attends Archivist Meetings: Not on file  . Marital Status: Not on file    Outpatient  Encounter Medications as of 12/16/2019  Medication Sig  . amLODipine (NORVASC) 2.5 MG tablet TAKE 1 TABLET(2.5 MG) BY MOUTH DAILY  . CONTOUR TEST test strip USE TO TEST BLOOD SUGAR ONCE EVERY WEEK  . diphenhydrAMINE (BENADRYL) 25 mg capsule Take 25 mg at bedtime as needed by mouth for sleep.   Marland Kitchen FEROSUL 325 (65 Fe) MG tablet TAKE 1 TABLET(325 MG) BY MOUTH DAILY  . HYDROcodone-acetaminophen (NORCO/VICODIN) 5-325 MG tablet Take 1 tablet by mouth every 6 (six) hours as needed.  Marland Kitchen losartan (COZAAR) 100 MG tablet Take 1 tablet (100 mg total) by mouth daily.  . Multiple Vitamin (MULTIVITAMIN WITH MINERALS) TABS tablet Take 1 tablet daily by mouth.  . pantoprazole (PROTONIX) 40 MG tablet TAKE 1 TABLET(40 MG) BY MOUTH DAILY  . simvastatin (ZOCOR) 10 MG tablet TAKE 1 TABLET BY MOUTH EVERY DAY  . [DISCONTINUED] acyclovir (ZOVIRAX) 400 MG tablet Take 1 tablet (400 mg total) by mouth 3 (three) times daily. (Patient not taking: Reported on 12/11/2019)  . [DISCONTINUED] penciclovir (DENAVIR) 1 % cream Apply to affected area on lips tid  . [DISCONTINUED] warfarin (COUMADIN) 2.5 MG tablet Takes 2 tablets (5 mg) on Tuesdays and Thursdays and Saturday, and 1 tablet (2.5 mg)  all other days of the week (Patient not taking: Reported on 12/17/2019)   No facility-administered encounter medications on file as of 12/16/2019.    Review of Systems  Constitutional: Negative for appetite change and fever.  HENT: Negative for congestion and sinus pressure.   Respiratory: Negative for cough and shortness of breath.   Cardiovascular: Negative for palpitations.  Gastrointestinal: Negative for diarrhea, nausea and vomiting.  Psychiatric/Behavioral: Negative for agitation and dysphoric mood.       Objective:    Physical Exam Vitals reviewed.  Constitutional:      General: She is not in acute distress.    Appearance: Normal appearance.  HENT:     Head: Normocephalic and atraumatic.     Right Ear: External ear  normal.     Left Ear: External ear normal.     Mouth/Throat:     Comments: No lesions noted on lips.  Minimal erythema - corners of mouth.  OP without lesions.  Cardiovascular:     Rate and Rhythm: Normal rate and regular rhythm.  Pulmonary:     Effort: No respiratory distress.     Breath sounds: Normal breath sounds. No wheezing.  Abdominal:     General: Bowel sounds are normal.     Palpations: Abdomen is soft.     Tenderness: There is no abdominal tenderness.  Musculoskeletal:        General: No tenderness.  Cervical back: Neck supple. No tenderness.     Comments: No increased swelling.   Lymphadenopathy:     Cervical: No cervical adenopathy.  Skin:    Findings: No erythema or rash.  Neurological:     Mental Status: She is alert.  Psychiatric:        Mood and Affect: Mood normal.        Behavior: Behavior normal.     BP 122/70   Pulse 90   Temp (!) 97.5 F (36.4 C) (Oral)   Resp 16   Wt 242 lb (109.8 kg)   SpO2 98%   BMI 35.74 kg/m  Wt Readings from Last 3 Encounters:  12/16/19 242 lb (109.8 kg)  12/11/19 237 lb (107.5 kg)  12/02/19 237 lb (107.5 kg)     Lab Results  Component Value Date   WBC 9.2 12/02/2019   HGB 14.1 12/02/2019   HCT 41.9 12/02/2019   PLT 238.0 12/02/2019   GLUCOSE 119 (H) 11/04/2019   CHOL 156 12/11/2018   TRIG 100.0 12/11/2018   HDL 55.90 12/11/2018   LDLCALC 80 12/11/2018   ALT 10 11/04/2019   AST 14 11/04/2019   NA 135 11/04/2019   K 4.1 11/04/2019   CL 102 11/04/2019   CREATININE 0.81 11/04/2019   BUN 14 11/04/2019   CO2 26 11/04/2019   TSH 3.15 05/06/2019   INR 2.6 (H) 12/10/2019   HGBA1C 5.8 11/04/2019   MICROALBUR <0.7 11/14/2017    DG Lumbar Spine Complete W/Bend  Result Date: 12/12/2019 CLINICAL DATA:  Chronic low back pain. EXAM: LUMBAR SPINE - COMPLETE WITH BENDING VIEWS COMPARISON:  October 08, 2018. FINDINGS: Stable old L3 compression fracture is noted. No acute fracture is noted. Severe degenerative disc  disease is noted at L1-2, L2-3 and L3-4. Minimal grade 1 retrolisthesis of L3-4 is noted. No change in vertebral body alignment is noted on flexion or extension views. IMPRESSION: Stable old L3 compression fracture. Severe multilevel degenerative disc disease is noted. No acute abnormality seen in the lumbar spine. Electronically Signed   By: Marijo Conception M.D.   On: 12/12/2019 08:49       Assessment & Plan:   Problem List Items Addressed This Visit    Type 2 diabetes mellitus without complications ([place])    Sugars have been under good control. On no medication.  Follow met b and a1c.        Lip lesion    Question of HSV on last visit.  Took acyclovir.  Lips look better. Discussed trying not to lick her lips.  Trial of denavir to see if symptoms completely resolve.  No lesion inside her mouth.  May need dermatology input.        Hypertension    Blood pressure under good control.  Continue losartan and amlodipine.  Follow pressures.  Follow metabolic panel.           Einar Pheasant, MD

## 2019-12-17 ENCOUNTER — Encounter: Payer: Self-pay | Admitting: Internal Medicine

## 2019-12-17 ENCOUNTER — Encounter: Payer: Self-pay | Admitting: Pain Medicine

## 2019-12-17 NOTE — Progress Notes (Signed)
Patient: Anita Walker  Service Category: E/M  Provider: Gaspar Cola, MD  DOB: 05-Jul-1940  DOS: 12/18/2019  Location: Office  MRN: [id number]  Setting: Ambulatory outpatient  Referring Provider: Einar Pheasant, MD  Type: Established Patient  Specialty: Interventional Pain Management  PCP: Einar Pheasant, MD  Location: Remote location  Delivery: TeleHealth     Virtual Encounter - Pain Management PROVIDER NOTE: Information contained herein reflects review and annotations entered in association with encounter. Interpretation of such information and data should be left to medically-trained personnel. Information provided to patient can be located elsewhere in the medical record under "Patient Instructions". Document created using STT-dictation technology, any transcriptional errors that may result from process are unintentional.    Contact & Pharmacy Preferred: [phone] Home: [phone] (home) Mobile: [phone] (mobile) E-mail: [email or web address]  [facility], [place] [facility] [address] [place] [address] Phone: [phone] Fax: [phone]   Pre-screening  Ms. Walker offered "in-person" vs "virtual" encounter. She indicated preferring virtual for this encounter.   Reason COVID-19*  Social distancing based on CDC and AMA recommendations.   I contacted Anita Walker on 12/18/2019 via telephone.      I clearly identified myself as Gaspar Cola, MD. I verified that I was speaking with the correct person using two identifiers (Name: Anita Walker, and date of birth: 1940-03-27).  Consent I sought verbal advanced consent from Anita Walker for virtual visit interactions. I informed Ms. Walker of possible security and privacy concerns, risks, and limitations associated with providing "not-in-person" medical evaluation and management services. I also informed Ms. Walker of the availability of "in-person"  appointments. Finally, I informed her that there would be a charge for the virtual visit and that she could be  personally, fully or partially, financially responsible for it. Ms. Walker expressed understanding and agreed to proceed.   Historic Elements   [name] is a 79 y.o. year old, female patient evaluated today after our last contact on 12/11/2019. Ms. Walker  has a past medical history of Anemia, Arthritis, Atrial fibrillation ([place]), Breast cancer ([place]), Diabetes mellitus ([place]), Edema, Gastric ulcer, GERD (gastroesophageal reflux disease), HOH (hard of hearing), Hypercholesterolemia, Hypertension, and Pulmonary emboli ([place]). She also  has a past surgical history that includes Cataract extraction w/PHACO (Right, 12/26/2016); Cataract extraction w/PHACO (Left, 01/23/2017); Breast lumpectomy (03/06/1990); and Colonoscopy with propofol (N/A, 07/08/2019). Ms. Walker has a current medication list which includes the following prescription(s): amlodipine, contour test, diphenhydramine, ferosul, hydrocodone-acetaminophen, losartan, multivitamin with minerals, pantoprazole, simvastatin, and acyclovir ointment. She  reports that she has never smoked. She has never used smokeless tobacco. She reports that she does not drink alcohol and does not use drugs. Ms. Walker is allergic to penicillins and penicillin v potassium.   HPI  Today, she is being contacted for follow-up evaluation.  (The patient was here initially referred to [place] as a "Fast-Track").  She returns to the clinic today after having had some x-rays done of the lumbar spine for an acute on chronic low back pain flareup.  The x-rays indicate no changes.  Today we went over the results of the x-ray in great detail and I explained those to her in layman's terms.  At this point she says that the pain is under control and whenever she has a little bit of a flareup she controls it by taking some Tylenol.  I told her that  if by any chance the pain gets to  the point where she loses control of it then she needs to just give me a call and we can consider doing further injections.  I explained to her that if the pain is in the back we could do some lumbar facet blocks but if the pain is in the lower extremities then we would have to do some transforaminal epidural steroid injections versus LESI.  She understood and accepted and indicated that she would give [place] a call if she needed [place].  Pharmacotherapy Assessment  Analgesic: None.  Highest recorded MME/day: 25 mg/day MME/day: 0 mg/day   Monitoring: [place] PMP: PDMP reviewed during this encounter.       Pharmacotherapy: No side-effects or adverse reactions reported. Compliance: No problems identified. Effectiveness: Clinically acceptable. Plan: Refer to "POC".  UDS: No results found for: SUMMARY  Laboratory Chemistry Profile   Renal Lab Results  Component Value Date   BUN 14 11/04/2019   CREATININE 0.81 11/04/2019   GFR 67.76 11/04/2019   GFRAA >60 08/07/2018   GFRNONAA >60 08/07/2018     Hepatic Lab Results  Component Value Date   AST 14 11/04/2019   ALT 10 11/04/2019   ALBUMIN 3.9 11/04/2019   ALKPHOS 60 11/04/2019     Electrolytes Lab Results  Component Value Date   NA 135 11/04/2019   K 4.1 11/04/2019   CL 102 11/04/2019   CALCIUM 9.5 11/04/2019     Bone No results found for: VD25OH, VD125OH2TOT, HC6237SE8, BT5176HY0, 25OHVITD1, 25OHVITD2, 25OHVITD3, TESTOFREE, TESTOSTERONE   Inflammation (CRP: Acute Phase) (ESR: Chronic Phase) Lab Results  Component Value Date   CRP <0.8 10/31/2016   ESRSEDRATE 5 10/31/2016   LATICACIDVEN 1.1 10/30/2016       Note: Above Lab results reviewed.  Imaging  DG Lumbar Spine Complete W/Bend CLINICAL DATA:  Chronic low back pain.  EXAM: LUMBAR SPINE - COMPLETE WITH BENDING VIEWS  COMPARISON:  October 08, 2018.  FINDINGS: Stable old L3 compression fracture is noted. No acute fracture is noted. Severe degenerative disc disease is  noted at L1-2, L2-3 and L3-4. Minimal grade 1 retrolisthesis of L3-4 is noted. No change in vertebral body alignment is noted on flexion or extension views.  IMPRESSION: Stable old L3 compression fracture. Severe multilevel degenerative disc disease is noted. No acute abnormality seen in the lumbar spine.  Electronically Signed   By: Marijo Conception M.D.   On: 12/12/2019 08:49  Assessment  The primary encounter diagnosis was Chronic pain syndrome. 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  Plan of Care  Problem-specific:  No problem-specific Assessment & Plan notes found for this encounter.  Ms. Maida Widger Walker has a current medication list which includes the following long-term medication(s): amlodipine, diphenhydramine, ferosul, losartan, pantoprazole, and simvastatin.  Pharmacotherapy (Medications Ordered): No orders of the defined types were placed in this encounter.  Orders:  No orders of the defined types were placed in this encounter.  Follow-up plan:   Return if symptoms worsen or fail to improve.      Interventional treatment options: Planned, scheduled, and/or pending:   None at this time.   Under consideration:  NOTE: COUMADIN ANTICOAGULATION (Stop:  5 days  Restart: 2 hrs) (NO MORE RFA - Poor intra-op compliance) history of vasovagal with IV access. Diagnostic right-sided vs bilateral lumbar facet block #2    Therapeutic/palliative (PRN):   Palliative bilateral lumbar facet block #3  Therapeutic right lumbar facet RFA (done 09/18/2019) (NO MORE RFA - Poor intra-op compliance)  Therapeutic left lumbar facet RFA (done 11/18/2019)  (NO MORE RFA - Unreliable testing feedback)      Recent Visits Date Type Provider Dept  12/11/19 Office Visit Milinda Pointer, MD [facility]  11/18/19 Procedure visit Milinda Pointer, MD [facility]  10/06/19 Telemedicine Milinda Pointer, MD [facility]  Showing recent visits within past 90 days and meeting all other requirements Today's Visits Date Type Provider Dept  12/18/19 Telemedicine Milinda Pointer, MD [facility]  Showing today's visits and meeting all other requirements Future Appointments No visits were found meeting these conditions. Showing future appointments within next 90 days and meeting all other requirements  I discussed the assessment and treatment plan with the patient. The patient was provided an opportunity to ask questions and all were answered. The patient agreed with the plan and demonstrated an understanding of the instructions.  Patient advised to call back or seek an in-person evaluation if the symptoms or condition worsens.  Duration of encounter: 13 minutes.  Note by: Gaspar Cola, MD Date: 12/18/2019; Time: 3:17 PM

## 2019-12-18 ENCOUNTER — Ambulatory Visit: Payer: Medicare Other | Attending: Pain Medicine | Admitting: Pain Medicine

## 2019-12-18 ENCOUNTER — Other Ambulatory Visit: Payer: Self-pay

## 2019-12-18 DIAGNOSIS — G8929 Other chronic pain: Secondary | ICD-10-CM | POA: Diagnosis not present

## 2019-12-18 DIAGNOSIS — M47816 Spondylosis without myelopathy or radiculopathy, lumbar region: Secondary | ICD-10-CM | POA: Diagnosis not present

## 2019-12-18 DIAGNOSIS — M545 Low back pain, unspecified: Secondary | ICD-10-CM

## 2019-12-18 DIAGNOSIS — M4310 Spondylolisthesis, site unspecified: Secondary | ICD-10-CM | POA: Diagnosis not present

## 2019-12-18 DIAGNOSIS — G894 Chronic pain syndrome: Secondary | ICD-10-CM | POA: Diagnosis not present

## 2019-12-18 DIAGNOSIS — S32030S Wedge compression fracture of third lumbar vertebra, sequela: Secondary | ICD-10-CM | POA: Diagnosis not present

## 2019-12-18 DIAGNOSIS — M48061 Spinal stenosis, lumbar region without neurogenic claudication: Secondary | ICD-10-CM

## 2019-12-18 MED ORDER — ACYCLOVIR 5 % EX OINT: 1.0000 "application " | TOPICAL_OINTMENT | Freq: Four times a day (QID) | CUTANEOUS | 0 refills | Status: DC

## 2019-12-18 NOTE — Telephone Encounter (Signed)
Pt said that the ointment costs $400 at [facility]. She wants to know if you want her to get in touch with Dr. Phillip Heal? She would like a call back today.

## 2019-12-18 NOTE — Telephone Encounter (Signed)
Patient is aware that the denavir and the acyclovir are not covered by her insurance. She is going to call Dr Elveria Rising office in the morning and will let me know if she needs anything from [place].

## 2019-12-18 NOTE — Telephone Encounter (Signed)
rx sent in for acyclovir ointment.   

## 2019-12-18 NOTE — Telephone Encounter (Signed)
Is the new ointment 400$ or are they saying the denavir is 400$.  If the new one is 400$, then I would like for them to see dermatology.

## 2019-12-19 DIAGNOSIS — S62202A Unspecified fracture of first metacarpal bone, left hand, initial encounter for closed fracture: Secondary | ICD-10-CM | POA: Diagnosis not present

## 2019-12-19 DIAGNOSIS — S52515A Nondisplaced fracture of left radial styloid process, initial encounter for closed fracture: Secondary | ICD-10-CM | POA: Diagnosis not present

## 2019-12-21 ENCOUNTER — Encounter: Payer: Self-pay | Admitting: Internal Medicine

## 2019-12-21 NOTE — Assessment & Plan Note (Signed)
Blood pressure under good control.  Continue losartan and amlodipine.  Follow pressures.  Follow metabolic panel.

## 2019-12-21 NOTE — Assessment & Plan Note (Signed)
Sugars have been under good control. On no medication.  Follow met b and a1c.

## 2019-12-21 NOTE — Assessment & Plan Note (Signed)
Question of HSV on last visit.  Took acyclovir.  Lips look better. Discussed trying not to lick her lips.  Trial of denavir to see if symptoms completely resolve.  No lesion inside her mouth.  May need dermatology input.

## 2019-12-22 DIAGNOSIS — L249 Irritant contact dermatitis, unspecified cause: Secondary | ICD-10-CM | POA: Diagnosis not present

## 2019-12-24 ENCOUNTER — Ambulatory Visit
Admission: RE | Admit: 2019-12-24 | Discharge: 2019-12-24 | Disposition: A | Discharge status: Home / Self Care | Payer: Medicare Other | Source: Ambulatory Visit | Attending: Internal Medicine | Admitting: Internal Medicine

## 2019-12-24 ENCOUNTER — Other Ambulatory Visit: Payer: Self-pay

## 2019-12-24 DIAGNOSIS — Z1231 Encounter for screening mammogram for malignant neoplasm of breast: Secondary | ICD-10-CM | POA: Insufficient documentation

## 2019-12-24 HISTORY — DX: Personal history of antineoplastic chemotherapy: Z92.21

## 2019-12-24 NOTE — Progress Notes (Signed)
PTNS  Session # Monthly maintenance  Health & Social Factors: No change Caffeine: 1 1/2  Alcohol: 0 Daytime voids #per day: 4-5 Night-time voids #per night: 2 Urgency: severe Incontinence Episodes #per day: 1-2 last week  Ankle used: Right Treatment Setting: 11 Feeling/ Response: Sensory Comments: Patient tolerated the procedure  Performed By: Zara Council, PA-C   Follow Up: One month follow up for maintenance PTNS

## 2019-12-25 ENCOUNTER — Ambulatory Visit (INDEPENDENT_AMBULATORY_CARE_PROVIDER_SITE_OTHER): Payer: Medicare Other | Admitting: Urology

## 2019-12-25 DIAGNOSIS — N3941 Urge incontinence: Secondary | ICD-10-CM

## 2019-12-26 ENCOUNTER — Telehealth: Payer: Self-pay

## 2019-12-26 ENCOUNTER — Telehealth: Payer: Self-pay | Admitting: *Deleted

## 2019-12-26 ENCOUNTER — Other Ambulatory Visit (INDEPENDENT_AMBULATORY_CARE_PROVIDER_SITE_OTHER): Payer: Medicare Other

## 2019-12-26 ENCOUNTER — Other Ambulatory Visit: Payer: Self-pay

## 2019-12-26 DIAGNOSIS — Z7901 Long term (current) use of anticoagulants: Secondary | ICD-10-CM | POA: Diagnosis not present

## 2019-12-26 DIAGNOSIS — R791 Abnormal coagulation profile: Secondary | ICD-10-CM

## 2019-12-26 LAB — PROTIME-INR
INR: 6.7 ratio (ref 0.8–1.0)
Prothrombin Time: 73.7 s (ref 9.6–13.1)

## 2019-12-26 NOTE — Telephone Encounter (Signed)
Dr Dossie Arbour, Mrs [place] called complaining of left hip pain.  She states this is a new problem.  She wants a procedure but I told her she needed an appointment first.  They scheduled her for a virtual visit and Monday at [place] , she is on Coumadin.  Is this plan OK with you?

## 2019-12-26 NOTE — Telephone Encounter (Signed)
Have her stop her coumadin.  Please confirm if she is having any bleeding.  Also, need to confirm if she has been sick or has been on any abx.  Recheck tomorrow at [facility].  Let me know if problems and send back to me and I will place order for pt/inr

## 2019-12-26 NOTE — Telephone Encounter (Signed)
Pt on diflucan.  Doing well. No bleeding.  Will stop coumadin.  Recheck tomorrow to confirm not continuing to increase.  Will hold diflucan until results return.  Pt aware.  See note.

## 2019-12-26 NOTE — Telephone Encounter (Signed)
CRITICAL VALUE STICKER  CRITICAL VALUE: PT: 73.7; INR 6.7  RECEIVER (on-site recipient of call):Sapir Lavey  DATE & TIME NOTIFIED: 12/26/19; 4:05  MESSENGER (representative from lab): Journalist, newspaper (student) & Santiago Glad   MD NOTIFIED: Dr. Nicki Reaper  TIME OF NOTIFICATION: 4:10 pm  RESPONSE:

## 2019-12-26 NOTE — Telephone Encounter (Signed)
Patient voice understanding to results and will stop coumadin, patient taking fluconazole 200 mg tablets once daily fo r14 days for her lips she said only new medication.

## 2019-12-26 NOTE — Addendum Note (Signed)
Addended by: Alisa Graff on: 12/26/2019 06:15 PM   Modules accepted: Orders

## 2019-12-26 NOTE — Telephone Encounter (Signed)
Per verbal patient was advised to hold coumadin again and hold diflucan tonight until she here from PCP. And taking also Hydrocodone for pain .

## 2019-12-26 NOTE — Addendum Note (Signed)
Addended by: Leeanne Rio on: 12/26/2019 02:29 PM   Modules accepted: Orders

## 2019-12-27 ENCOUNTER — Other Ambulatory Visit
Admission: RE | Admit: 2019-12-27 | Discharge: 2019-12-27 | Disposition: A | Discharge status: Home / Self Care | Payer: Medicare Other | Attending: Internal Medicine | Admitting: Internal Medicine

## 2019-12-27 ENCOUNTER — Other Ambulatory Visit: Payer: Self-pay | Admitting: Internal Medicine

## 2019-12-27 DIAGNOSIS — Z7901 Long term (current) use of anticoagulants: Secondary | ICD-10-CM

## 2019-12-27 DIAGNOSIS — R791 Abnormal coagulation profile: Secondary | ICD-10-CM

## 2019-12-27 LAB — HEPATIC FUNCTION PANEL
ALT: 15 U/L (ref 0–44)
AST: 20 U/L (ref 15–41)
Albumin: 4 g/dL (ref 3.5–5.0)
Alkaline Phosphatase: 73 U/L (ref 38–126)
Bilirubin, Direct: <0.1 mg/dL (ref 0.0–0.2)
Total Bilirubin: 0.8 mg/dL (ref 0.3–1.2)
Total Protein: 7.1 g/dL (ref 6.5–8.1)

## 2019-12-27 LAB — PROTIME-INR
INR: 4.5 (ref 0.8–1.2)
Prothrombin Time: 41.6 seconds — ABNORMAL HIGH (ref 11.4–15.2)

## 2019-12-27 NOTE — Progress Notes (Signed)
Order placed for f/u pt/inr

## 2019-12-29 ENCOUNTER — Encounter: Payer: Self-pay | Admitting: Pain Medicine

## 2019-12-29 ENCOUNTER — Other Ambulatory Visit
Admission: RE | Admit: 2019-12-29 | Discharge: 2019-12-29 | Disposition: A | Discharge status: Home / Self Care | Payer: Medicare Other | Source: Ambulatory Visit | Attending: Internal Medicine | Admitting: Internal Medicine

## 2019-12-29 ENCOUNTER — Other Ambulatory Visit: Payer: Self-pay

## 2019-12-29 ENCOUNTER — Ambulatory Visit
Payer: Medicare Other | Attending: Student in an Organized Health Care Education/Training Program | Admitting: Pain Medicine

## 2019-12-29 ENCOUNTER — Telehealth: Payer: Self-pay

## 2019-12-29 ENCOUNTER — Other Ambulatory Visit: Payer: Self-pay | Admitting: *Deleted

## 2019-12-29 DIAGNOSIS — M47816 Spondylosis without myelopathy or radiculopathy, lumbar region: Secondary | ICD-10-CM

## 2019-12-29 DIAGNOSIS — M47817 Spondylosis without myelopathy or radiculopathy, lumbosacral region: Secondary | ICD-10-CM

## 2019-12-29 DIAGNOSIS — Z7901 Long term (current) use of anticoagulants: Secondary | ICD-10-CM | POA: Diagnosis not present

## 2019-12-29 DIAGNOSIS — M545 Low back pain, unspecified: Secondary | ICD-10-CM

## 2019-12-29 DIAGNOSIS — G894 Chronic pain syndrome: Secondary | ICD-10-CM | POA: Diagnosis not present

## 2019-12-29 DIAGNOSIS — G8929 Other chronic pain: Secondary | ICD-10-CM | POA: Diagnosis not present

## 2019-12-29 LAB — PROTIME-INR
INR: 3.7 — ABNORMAL HIGH (ref 0.8–1.2)
Prothrombin Time: 35.7 seconds — ABNORMAL HIGH (ref 11.4–15.2)

## 2019-12-29 NOTE — Patient Instructions (Signed)
____________________________________________________________________________________________  Preparing for Procedure with Sedation  Procedure appointments are limited to planned procedures: . No Prescription Refills. . No disability issues will be discussed. . No medication changes will be discussed.  Instructions: . Oral Intake: Do not eat or drink anything for at least 8 hours prior to your procedure. (Exception: Blood Pressure Medication. See below.) . Transportation: Unless otherwise stated by your physician, you may drive yourself after the procedure. . Blood Pressure Medicine: Do not forget to take your blood pressure medicine with a sip of water the morning of the procedure. If your Diastolic (lower reading)is above 100 mmHg, elective cases will be cancelled/rescheduled. . Blood thinners: These will need to be stopped for procedures. Notify our staff if you are taking any blood thinners. Depending on which one you take, there will be specific instructions on how and when to stop it. . Diabetics on insulin: Notify the staff so that you can be scheduled 1st case in the morning. If your diabetes requires high dose insulin, take only  of your normal insulin dose the morning of the procedure and notify the staff that you have done so. . Preventing infections: Shower with an antibacterial soap the morning of your procedure. . Build-up your immune system: Take 1000 mg of Vitamin C with every meal (3 times a day) the day prior to your procedure. . Antibiotics: Inform the staff if you have a condition or reason that requires you to take antibiotics before dental procedures. . Pregnancy: If you are pregnant, call and cancel the procedure. . Sickness: If you have a cold, fever, or any active infections, call and cancel the procedure. . Arrival: You must be in the facility at least 30 minutes prior to your scheduled procedure. . Children: Do not bring children with you. . Dress appropriately:  Bring dark clothing that you would not mind if they get stained. . Valuables: Do not bring any jewelry or valuables.  Reasons to call and reschedule or cancel your procedure: (Following these recommendations will minimize the risk of a serious complication.) . Surgeries: Avoid having procedures within 2 weeks of any surgery. (Avoid for 2 weeks before or after any surgery). . Flu Shots: Avoid having procedures within 2 weeks of a flu shots or . (Avoid for 2 weeks before or after immunizations). . Barium: Avoid having a procedure within 7-10 days after having had a radiological study involving the use of radiological contrast. (Myelograms, Barium swallow or enema study). . Heart attacks: Avoid any elective procedures or surgeries for the initial 6 months after a "Myocardial Infarction" (Heart Attack). . Blood thinners: It is imperative that you stop these medications before procedures. Let us know if you if you take any blood thinner.  . Infection: Avoid procedures during or within two weeks of an infection (including chest colds or gastrointestinal problems). Symptoms associated with infections include: Localized redness, fever, chills, night sweats or profuse sweating, burning sensation when voiding, cough, congestion, stuffiness, runny nose, sore throat, diarrhea, nausea, vomiting, cold or Flu symptoms, recent or current infections. It is specially important if the infection is over the area that we intend to treat. . Heart and lung problems: Symptoms that may suggest an active cardiopulmonary problem include: cough, chest pain, breathing difficulties or shortness of breath, dizziness, ankle swelling, uncontrolled high or unusually low blood pressure, and/or palpitations. If you are experiencing any of these symptoms, cancel your procedure and contact your primary care physician for an evaluation.  Remember:  Regular Business hours are:    Monday to Thursday 8:00 AM to 4:00 PM  Provider's  Schedule: Ciearra Rufo, MD:  Procedure days: Tuesday and Thursday 7:30 AM to 4:00 PM  Bilal Lateef, MD:  Procedure days: Monday and Wednesday 7:30 AM to 4:00 PM ____________________________________________________________________________________________   ____________________________________________________________________________________________  Blood Thinners  IMPORTANT NOTICE:  If you take any of these, make sure to notify the nursing staff.  Failure to do so may result in injury.  Recommended time intervals to stop and restart blood-thinners, before & after invasive procedures  Generic Name Brand Name Stop Time. Must be stopped at least this long before procedures. After procedures, wait at least this long before re-starting.  Abciximab Reopro 15 days 2 hrs  Alteplase Activase 10 days 10 days  Anagrelide Agrylin    Apixaban Eliquis 3 days 6 hrs  Cilostazol Pletal 3 days 5 hrs  Clopidogrel Plavix 7-10 days 2 hrs  Dabigatran Pradaxa 5 days 6 hrs  Dalteparin Fragmin 24 hours 4 hrs  Dipyridamole Aggrenox 11days 2 hrs  Edoxaban Lixiana; Savaysa 3 days 2 hrs  Enoxaparin  Lovenox 24 hours 4 hrs  Eptifibatide Integrillin 8 hours 2 hrs  Fondaparinux  Arixtra 72 hours 12 hrs  Prasugrel Effient 7-10 days 6 hrs  Reteplase Retavase 10 days 10 days  Rivaroxaban Xarelto 3 days 6 hrs  Ticagrelor Brilinta 5-7 days 6 hrs  Ticlopidine Ticlid 10-14 days 2 hrs  Tinzaparin Innohep 24 hours 4 hrs  Tirofiban Aggrastat 8 hours 2 hrs  Warfarin Coumadin 5 days 2 hrs   Other medications with blood-thinning effects  Product indications Generic (Brand) names Note  Cholesterol Lipitor Stop 4 days before procedure  Blood thinner (injectable) Heparin (LMW or LMWH Heparin) Stop 24 hours before procedure  Cancer Ibrutinib (Imbruvica) Stop 7 days before procedure  Malaria/Rheumatoid Hydroxychloroquine (Plaquenil) Stop 11 days before procedure  Thrombolytics  10 days before or after procedures    Over-the-counter (OTC) Products with blood-thinning effects  Product Common names Stop Time  Aspirin > 325 mg Goody Powders, Excedrin, etc. 11 days  Aspirin ? 81 mg  7 days  Fish oil  4 days  Garlic supplements  7 days  Ginkgo biloba  36 hours  Ginseng  24 hours  NSAIDs Ibuprofen, Naprosyn, etc. 3 days  Vitamin E  4 days   ____________________________________________________________________________________________   

## 2019-12-29 NOTE — Progress Notes (Signed)
Patient: Anita Walker  Service Category: E/M  Provider: Gaspar Cola, MD  DOB: 1940/11/11  DOS: 12/29/2019  Location: Office  MRN: [id number]  Setting: Ambulatory outpatient  Referring Provider: Einar Pheasant, MD  Type: Established Patient  Specialty: Interventional Pain Management  PCP: Einar Pheasant, MD  Location: Remote location  Delivery: TeleHealth     Virtual Encounter - Pain Management PROVIDER NOTE: Information contained herein reflects review and annotations entered in association with encounter. Interpretation of such information and data should be left to medically-trained personnel. Information provided to patient can be located elsewhere in the medical record under "Patient Instructions". Document created using STT-dictation technology, any transcriptional errors that may result from process are unintentional.    Contact & Pharmacy Preferred: [phone] Home: [phone] (home) Mobile: [phone] (mobile) E-mail: [email or web address]  [place], [place] AT [facility] [address] [address] Phone: [phone] Fax: [phone]   Pre-screening  Ms. Walker offered "in-person" vs "virtual" encounter. She indicated preferring virtual for this encounter.   Reason COVID-19*  Social distancing based on CDC and AMA recommendations.   I contacted Anita Walker on 12/29/2019 via telephone.      I clearly identified myself as Gaspar Cola, MD. I verified that I was speaking with the correct person using two identifiers (Name: Anita Walker, and date of birth: 06/25/1940).  Consent I sought verbal advanced consent from Anita Walker for virtual visit interactions. I informed Ms. Walker of possible security and privacy concerns, risks, and limitations associated with providing "not-in-person" medical evaluation and management services. I also informed Ms. Walker of the availability of "in-person"  appointments. Finally, I informed her that there would be a charge for the virtual visit and that she could be  personally, fully or partially, financially responsible for it. Ms. Walker expressed understanding and agreed to proceed.   Historic Elements   [name] is a 79 y.o. year old, female patient evaluated today after our last contact on 12/11/2019. Ms. Walker  has a past medical history of Anemia, Arthritis, Atrial fibrillation ([place]), Breast cancer ([place]), Diabetes mellitus ([place]), Edema, Gastric ulcer, GERD (gastroesophageal reflux disease), HOH (hard of hearing), Hypercholesterolemia, Hypertension, Personal history of chemotherapy, and Pulmonary emboli ([place]). She also  has a past surgical history that includes Cataract extraction w/PHACO (Right, 12/26/2016); Cataract extraction w/PHACO (Left, 01/23/2017); Breast lumpectomy (03/06/1990); and Colonoscopy with propofol (N/A, 07/08/2019). Ms. Walker has a current medication list which includes the following prescription(s): acyclovir ointment, amlodipine, contour test, diphenhydramine, ferosul, fluconazole, hydrocodone-acetaminophen, losartan, multivitamin with minerals, pantoprazole, and simvastatin. She  reports that she has never smoked. She has never used smokeless tobacco. She reports that she does not drink alcohol and does not use drugs. Ms. Walker is allergic to penicillins and penicillin v potassium.   HPI  Today, she is being contacted for worsening of previously known (established) problem.  The patient indicates currently experiencing an exacerbation of her low back pain.  She indicates that it is exactly the same area that we have treated before, just a lot stronger.  She describes not being able to stand for any prolonged periods of time or to be able to ambulate secondary to this left-sided low back pain.  She denies any type of lower extremity pain in either leg.  No leg weakness and no leg numbness.  We will go ahead and schedule the  patient to come  in for a palliative left-sided lumbar facet block under fluoroscopic guidance.  She is on Coumadin and she refers to having stopped the Coumadin on her own, last Friday.  Pharmacotherapy Assessment  Analgesic: None.  Highest recorded MME/day: 25 mg/day MME/day: 0 mg/day   Monitoring: [place] PMP: PDMP not reviewed this encounter.       Pharmacotherapy: No side-effects or adverse reactions reported. Compliance: No problems identified. Effectiveness: Clinically acceptable. Plan: Refer to "POC".  UDS: No results found for: SUMMARY  Laboratory Chemistry Profile   Renal Lab Results  Component Value Date   BUN 14 11/04/2019   CREATININE 0.81 11/04/2019   GFR 67.76 11/04/2019   GFRAA >60 08/07/2018   GFRNONAA >60 08/07/2018     Hepatic Lab Results  Component Value Date   AST 20 12/27/2019   ALT 15 12/27/2019   ALBUMIN 4.0 12/27/2019   ALKPHOS 73 12/27/2019     Electrolytes Lab Results  Component Value Date   NA 135 11/04/2019   K 4.1 11/04/2019   CL 102 11/04/2019   CALCIUM 9.5 11/04/2019     Bone No results found for: VD25OH, VD125OH2TOT, ZO1096EA5, WU9811BJ4, 25OHVITD1, 25OHVITD2, 25OHVITD3, TESTOFREE, TESTOSTERONE   Inflammation (CRP: Acute Phase) (ESR: Chronic Phase) Lab Results  Component Value Date   CRP <0.8 10/31/2016   ESRSEDRATE 5 10/31/2016   LATICACIDVEN 1.1 10/30/2016       Note: Above Lab results reviewed.  Imaging  MM 3D SCREEN BREAST BILATERAL CLINICAL DATA:  Screening.  EXAM: DIGITAL SCREENING BILATERAL MAMMOGRAM WITH TOMO AND CAD  COMPARISON:  Previous exam(s).  ACR Breast Density Category c: The breast tissue is heterogeneously dense, which may obscure small masses.  FINDINGS: There are no findings suspicious for malignancy. Images were processed with CAD.  IMPRESSION: No mammographic evidence of malignancy. A result letter of this screening mammogram will be mailed directly to the patient.  RECOMMENDATION: Screening  mammogram in one year. (Code:[id number]-B-01Y)  BI-RADS CATEGORY  1: Negative.  Electronically Signed   By: Audie Pinto M.D.   On: 12/26/2019 16:44  Assessment  The primary encounter diagnosis was Chronic pain syndrome. Diagnoses of Acute exacerbation of chronic low back pain, Lumbar facet syndrome (Bilateral) (R>L), Spondylosis without myelopathy or radiculopathy, lumbosacral region, and Chronic anticoagulation (Coumadin) were also pertinent to this visit.  Plan of Care  Problem-specific:  No problem-specific Assessment & Plan notes found for this encounter.  Ms. Tai Syfert Walker has a current medication list which includes the following long-term medication(s): amlodipine, diphenhydramine, ferosul, losartan, pantoprazole, and simvastatin.  Pharmacotherapy (Medications Ordered): No orders of the defined types were placed in this encounter.  Orders:  Orders Placed This Encounter  Procedures  . LUMBAR FACET(MEDIAL BRANCH NERVE BLOCK) MBNB    Standing Status:   Future    Standing Expiration Date:   01/29/2020    Scheduling Instructions:     Procedure: Lumbar facet block (AKA.: Lumbosacral medial branch nerve block)     Side: Left-sided     Level: L3-4, L4-5, & L5-S1 Facets (L2, L3, L4, L5, & S1 Medial Branch Nerves)     Sedation: Patient's choice.     Timeframe: ASAA    Order Specific Question:   Where will this procedure be performed?    Answer:   [facility]  . Blood Thinner Instructions to Nursing    Always make sure patient has clearance from prescribing physician to stop blood thinners for interventional therapies. If the patient requires a Lovenox-bridge therapy, make sure  arrangements are made to institute it with the assistance of the PCP.    Scheduling Instructions:     Have Ms. Walker stop the Coumadin (Warfarin) X 5 days prior to procedure or surgery.   Follow-up plan:   Return for Procedure (w/ sedation): (L) L-FCT BLK, (Blood Thinner Protocol).       Interventional treatment options: Planned, scheduled, and/or pending:   None at this time.   Under consideration:  NOTE: COUMADIN ANTICOAGULATION (Stop: 5 days  Restart: 2 hrs) (NO MORE RFA - Poor intra-op compliance) history of vasovagal with IV access. Diagnostic right-sided vs bilateral lumbar facet block #2    Therapeutic/palliative (PRN):   Palliative bilateral lumbar facet block #3  Therapeutic right lumbar facet RFA (done 09/18/2019) (NO MORE RFA - Poor intra-op compliance)  Therapeutic left lumbar facet RFA (done 11/18/2019) (NO MORE RFA - Unreliable testing feedback)     Recent Visits Date Type Provider Dept  12/18/19 Telemedicine Milinda Pointer, [facility]  12/11/19 Office Visit Milinda Pointer, MD [facility]  11/18/19 Procedure visit Milinda Pointer, MD [facility]  10/06/19 Telemedicine Milinda Pointer, MD [facility]  Showing recent visits within past 90 days and meeting all other requirements Today's Visits Date Type Provider Dept  12/29/19 Telemedicine Milinda Pointer, MD [facility]  Showing today's visits and meeting all other requirements Future Appointments No visits were found meeting these conditions. Showing future appointments within next 90 days and meeting all other requirements  I discussed the assessment and treatment plan with the patient. The patient was provided an opportunity to ask questions and all were answered. The patient agreed with the plan and demonstrated an understanding of the instructions.  Patient advised to call back or seek an in-person evaluation if the symptoms or condition worsens.  Duration of encounter: 12 minutes.  Note by: Gaspar Cola, MD Date: 12/29/2019; Time: 9:55 AM

## 2019-12-29 NOTE — Telephone Encounter (Signed)
Called patient left vm asking her to call to schedule procedure at discharge

## 2019-12-31 ENCOUNTER — Other Ambulatory Visit
Admission: RE | Admit: 2019-12-31 | Discharge: 2019-12-31 | Disposition: A | Discharge status: Home / Self Care | Payer: Medicare Other | Source: Ambulatory Visit | Attending: Internal Medicine | Admitting: Internal Medicine

## 2019-12-31 ENCOUNTER — Telehealth: Payer: Medicare Other | Admitting: Pain Medicine

## 2019-12-31 DIAGNOSIS — Z7901 Long term (current) use of anticoagulants: Secondary | ICD-10-CM | POA: Diagnosis not present

## 2019-12-31 LAB — PROTIME-INR
INR: 2.3 — ABNORMAL HIGH (ref 0.8–1.2)
Prothrombin Time: 24.2 seconds — ABNORMAL HIGH (ref 11.4–15.2)

## 2020-01-01 ENCOUNTER — Ambulatory Visit
Admission: RE | Admit: 2020-01-01 | Discharge: 2020-01-01 | Disposition: A | Discharge status: Home / Self Care | Payer: Medicare Other | Source: Ambulatory Visit | Attending: Pain Medicine | Admitting: Pain Medicine

## 2020-01-01 ENCOUNTER — Ambulatory Visit (HOSPITAL_BASED_OUTPATIENT_CLINIC_OR_DEPARTMENT_OTHER): Payer: Medicare Other | Admitting: Pain Medicine

## 2020-01-01 ENCOUNTER — Encounter: Payer: Self-pay | Admitting: Pain Medicine

## 2020-01-01 ENCOUNTER — Other Ambulatory Visit: Payer: Self-pay

## 2020-01-01 VITALS — BP 141/80 | HR 102 | Temp 97.3°F | Resp 15 | Ht 69.0 in | Wt 242.0 lb

## 2020-01-01 DIAGNOSIS — I9789 Other postprocedural complications and disorders of the circulatory system, not elsewhere classified: Secondary | ICD-10-CM | POA: Diagnosis not present

## 2020-01-01 DIAGNOSIS — M545 Low back pain, unspecified: Secondary | ICD-10-CM | POA: Diagnosis not present

## 2020-01-01 DIAGNOSIS — G8929 Other chronic pain: Secondary | ICD-10-CM | POA: Insufficient documentation

## 2020-01-01 DIAGNOSIS — M47816 Spondylosis without myelopathy or radiculopathy, lumbar region: Secondary | ICD-10-CM | POA: Diagnosis not present

## 2020-01-01 DIAGNOSIS — M47817 Spondylosis without myelopathy or radiculopathy, lumbosacral region: Secondary | ICD-10-CM

## 2020-01-01 DIAGNOSIS — R55 Syncope and collapse: Secondary | ICD-10-CM | POA: Diagnosis not present

## 2020-01-01 DIAGNOSIS — Z7901 Long term (current) use of anticoagulants: Secondary | ICD-10-CM

## 2020-01-01 DIAGNOSIS — Z8679 Personal history of other diseases of the circulatory system: Secondary | ICD-10-CM | POA: Diagnosis not present

## 2020-01-01 DIAGNOSIS — M5137 Other intervertebral disc degeneration, lumbosacral region: Secondary | ICD-10-CM | POA: Insufficient documentation

## 2020-01-01 DIAGNOSIS — M5136 Other intervertebral disc degeneration, lumbar region: Secondary | ICD-10-CM | POA: Diagnosis not present

## 2020-01-01 DIAGNOSIS — R001 Bradycardia, unspecified: Secondary | ICD-10-CM

## 2020-01-01 MED ORDER — LACTATED RINGERS IV SOLN: 1000.0000 mL | Freq: Once | INTRAVENOUS | Status: DC

## 2020-01-01 MED ORDER — MIDAZOLAM HCL 5 MG/5ML IJ SOLN: 1.0000 mg | INTRAMUSCULAR | Status: DC | PRN

## 2020-01-01 MED ORDER — FENTANYL CITRATE (PF) 100 MCG/2ML IJ SOLN: 25.0000 ug | INTRAMUSCULAR | Status: DC | PRN

## 2020-01-01 MED ORDER — GLYCOPYRROLATE 0.2 MG/ML IJ SOLN
0.2000 mg | Freq: Once | INTRAMUSCULAR | Status: AC
  Administered 2020-01-01: 0.2 mg via INTRAVENOUS
  Filled 2020-01-01: qty 1

## 2020-01-01 MED ORDER — ROPIVACAINE HCL 2 MG/ML IJ SOLN
9.0000 mL | Freq: Once | INTRAMUSCULAR | Status: AC
  Administered 2020-01-01: 9 mL via PERINEURAL
  Filled 2020-01-01: qty 10

## 2020-01-01 MED ORDER — LIDOCAINE HCL 2 % IJ SOLN
20.0000 mL | Freq: Once | INTRAMUSCULAR | Status: AC
  Administered 2020-01-01: 400 mg
  Filled 2020-01-01: qty 20

## 2020-01-01 MED ORDER — TRIAMCINOLONE ACETONIDE 40 MG/ML IJ SUSP
40.0000 mg | Freq: Once | INTRAMUSCULAR | Status: AC
  Administered 2020-01-01: 40 mg
  Filled 2020-01-01: qty 1

## 2020-01-01 NOTE — Patient Instructions (Signed)

## 2020-01-01 NOTE — Progress Notes (Signed)
Safety precautions to be maintained throughout the outpatient stay will include: orient to surroundings, keep bed in low position, maintain call bell within reach at all times, provide assistance with transfer out of bed and ambulation.  

## 2020-01-01 NOTE — Progress Notes (Signed)
PROVIDER NOTE: Information contained herein reflects review and annotations entered in association with encounter. Interpretation of such information and data should be left to medically-trained personnel. Information provided to patient can be located elsewhere in the medical record under "Patient Instructions". Document created using STT-dictation technology, any transcriptional errors that may result from process are unintentional.    Patient: Anita Walker  Service Category: Procedure  Provider: Gaspar Cola, MD  DOB: [birth year]  DOS: 01/01/2020  Location: [facility]  MRN: [id number]  Setting: Ambulatory - outpatient  Referring Provider: Milinda Pointer, MD  Type: Established Patient  Specialty: Interventional Pain Management  PCP: Einar Pheasant, MD   Primary Reason for Visit: Interventional Pain Management Treatment. CC: Back Pain (low left)  Procedure:          Anesthesia, Analgesia, Anxiolysis:  Type: Lumbar Facet, Medial Branch Block(s) #3  Primary Purpose: Palliative Region: Posterolateral Lumbosacral Spine Level: L2, L3, L4, L5, & S1 Medial Branch Level(s). Injecting these levels blocks the L3-4, L4-5, and L5-S1 lumbar facet joints. Laterality: Left  Type: Local Anesthesia Indication(s): Analgesia         Route: Infiltration ([place]/IM) IV Access: Declined Sedation: None since NPO instructions were not followed  Local Anesthetic: Lidocaine 1-2%  Position: Prone   Indications: 1. Lumbar facet syndrome (Bilateral) (R>L)   2. Lumbar facet arthropathy (Multilevel)   3. Spondylosis without myelopathy or radiculopathy, lumbosacral region   4. DDD (degenerative disc disease), lumbosacral   5. 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 Chronic anticoagulation (Coumadin)   7. History of vasovagal symptom   8. Bradycardia following surgery    Pain Score: Pre-procedure: 1 /10 Post-procedure: 0-No pain/10   Pre-op Assessment:  Anita Walker  is a 79 y.o. (year old), female patient, seen today for interventional treatment. She  has a past surgical history that includes Cataract extraction w/PHACO (Right, 12/26/2016); Cataract extraction w/PHACO (Left, 01/23/2017); Breast lumpectomy (03/06/1990); and Colonoscopy with propofol (N/A, 07/08/2019). Anita Walker has a current medication list which includes the following prescription(s): acyclovir, acyclovir ointment, amlodipine, contour test, diphenhydramine, ferosul, fluconazole, hydrocodone-acetaminophen, losartan, multivitamin with minerals, pantoprazole, simvastatin, and warfarin. Her primarily concern today is the Back Pain (low left)  Initial Vital Signs:  Pulse/HCG Rate: 74  Temp: (!) 97.3 F (36.3 C) Resp: 18 BP: (!) 143/49 SpO2: 100 %  BMI: Estimated body mass index is 35.74 kg/m as calculated from the following:   Height as of this encounter: 5\' 9"  (1.753 m).   Weight as of this encounter: 242 lb (109.8 kg).  Risk Assessment: Allergies: Reviewed. She is allergic to penicillins and penicillin v potassium.  Allergy Precautions: None required Coagulopathies: Reviewed. None identified.  Blood-thinner therapy: None at this time Active Infection(s): Reviewed. None identified. Anita Walker is afebrile  Site Confirmation: Anita Walker was asked to confirm the procedure and laterality before marking the site Procedure checklist: Completed Consent: Before the procedure and under the influence of no sedative(s), amnesic(s), or anxiolytics, the patient was informed of the treatment options, risks and possible complications. To fulfill our ethical and legal obligations, as recommended by the American Medical Association's Code of Ethics, I have informed the patient of my clinical impression; the nature and purpose of the treatment or procedure; the risks, benefits, and possible complications of the intervention; the alternatives, including doing nothing; the risk(s) and benefit(s) of the alternative  treatment(s) or procedure(s); and the risk(s) and benefit(s) of doing nothing. The patient was provided information  about the general risks and possible complications associated with the procedure. These may include, but are not limited to: failure to achieve desired goals, infection, bleeding, organ or nerve damage, allergic reactions, paralysis, and death. In addition, the patient was informed of those risks and complications associated to Spine-related procedures, such as failure to decrease pain; infection (i.e.: Meningitis, epidural or intraspinal abscess); bleeding (i.e.: epidural hematoma, subarachnoid hemorrhage, or any other type of intraspinal or peri-dural bleeding); organ or nerve damage (i.e.: Any type of peripheral nerve, nerve root, or spinal cord injury) with subsequent damage to sensory, motor, and/or autonomic systems, resulting in permanent pain, numbness, and/or weakness of one or several areas of the body; allergic reactions; (i.e.: anaphylactic reaction); and/or death. Furthermore, the patient was informed of those risks and complications associated with the medications. These include, but are not limited to: allergic reactions (i.e.: anaphylactic or anaphylactoid reaction(s)); adrenal axis suppression; blood sugar elevation that in diabetics may result in ketoacidosis or comma; water retention that in patients with history of congestive heart failure may result in shortness of breath, pulmonary edema, and decompensation with resultant heart failure; weight gain; swelling or edema; medication-induced neural toxicity; particulate matter embolism and blood vessel occlusion with resultant organ, and/or nervous system infarction; and/or aseptic necrosis of one or more joints. Finally, the patient was informed that Medicine is not an exact science; therefore, there is also the possibility of unforeseen or unpredictable risks and/or possible complications that may result in a catastrophic  outcome. The patient indicated having understood very clearly. We have given the patient no guarantees and we have made no promises. Enough time was given to the patient to ask questions, all of which were answered to the patient's satisfaction. Anita Walker has indicated that she wanted to continue with the procedure. Attestation: I, the ordering provider, attest that I have discussed with the patient the benefits, risks, side-effects, alternatives, likelihood of achieving goals, and potential problems during recovery for the procedure that I have provided informed consent. Date  Time: 01/01/2020 11:16 AM  Pre-Procedure Preparation:  Monitoring: As per clinic protocol. Respiration, ETCO2, SpO2, BP, heart rate and rhythm monitor placed and checked for adequate function Safety Precautions: Patient was assessed for positional comfort and pressure points before starting the procedure. Time-out: I initiated and conducted the "Time-out" before starting the procedure, as per protocol. The patient was asked to participate by confirming the accuracy of the "Time Out" information. Verification of the correct person, site, and procedure were performed and confirmed by me, the nursing staff, and the patient. "Time-out" conducted as per Joint Commission's Universal Protocol (UP.01.01.01). Time: 1209  Description of Procedure:          Laterality: Left Levels:  L2, L3, L4, L5, & S1 Medial Branch Level(s) Area Prepped: Posterior Lumbosacral Region DuraPrep (Iodine Povacrylex [0.7% available iodine] and Isopropyl Alcohol, 74% w/w) Safety Precautions: Aspiration looking for blood return was conducted prior to all injections. At no point did we inject any substances, as a needle was being advanced. Before injecting, the patient was told to immediately notify me if she was experiencing any new onset of "ringing in the ears, or metallic taste in the mouth". No attempts were made at seeking any paresthesias. Safe  injection practices and needle disposal techniques used. Medications properly checked for expiration dates. SDV (single dose vial) medications used. After the completion of the procedure, all disposable equipment used was discarded in the proper designated medical waste containers. Local Anesthesia: Protocol guidelines were followed.  The patient was positioned over the fluoroscopy table. The area was prepped in the usual manner. The time-out was completed. The target area was identified using fluoroscopy. A 12-in long, straight, sterile hemostat was used with fluoroscopic guidance to locate the targets for each level blocked. Once located, the skin was marked with an approved surgical skin marker. Once all sites were marked, the skin (epidermis, dermis, and hypodermis), as well as deeper tissues (fat, connective tissue and muscle) were infiltrated with a small amount of a short-acting local anesthetic, loaded on a 10cc syringe with a 25G, 1.5-in  Needle. An appropriate amount of time was allowed for local anesthetics to take effect before proceeding to the next step. Local Anesthetic: Lidocaine 2.0% The unused portion of the local anesthetic was discarded in the proper designated containers. Technical explanation of process:  L2 Medial Branch Nerve Block (MBB): The target area for the L2 medial branch is at the junction of the postero-lateral aspect of the superior articular process and the superior, posterior, and medial edge of the transverse process of L3. Under fluoroscopic guidance, a Quincke needle was inserted until contact was made with os over the superior postero-lateral aspect of the pedicular shadow (target area). After negative aspiration for blood, 0.5 mL of the nerve block solution was injected without difficulty or complication. The needle was removed intact. L3 Medial Branch Nerve Block (MBB): The target area for the L3 medial branch is at the junction of the postero-lateral aspect of the  superior articular process and the superior, posterior, and medial edge of the transverse process of L4. Under fluoroscopic guidance, a Quincke needle was inserted until contact was made with os over the superior postero-lateral aspect of the pedicular shadow (target area). After negative aspiration for blood, 0.5 mL of the nerve block solution was injected without difficulty or complication. The needle was removed intact. L4 Medial Branch Nerve Block (MBB): The target area for the L4 medial branch is at the junction of the postero-lateral aspect of the superior articular process and the superior, posterior, and medial edge of the transverse process of L5. Under fluoroscopic guidance, a Quincke needle was inserted until contact was made with os over the superior postero-lateral aspect of the pedicular shadow (target area). After negative aspiration for blood, 0.5 mL of the nerve block solution was injected without difficulty or complication. The needle was removed intact. L5 Medial Branch Nerve Block (MBB): The target area for the L5 medial branch is at the junction of the postero-lateral aspect of the superior articular process and the superior, posterior, and medial edge of the sacral ala. Under fluoroscopic guidance, a Quincke needle was inserted until contact was made with os over the superior postero-lateral aspect of the pedicular shadow (target area). After negative aspiration for blood, 0.5 mL of the nerve block solution was injected without difficulty or complication. The needle was removed intact. S1 Medial Branch Nerve Block (MBB): The target area for the S1 medial branch is at the posterior and inferior 6 o'clock position of the L5-S1 facet joint. Under fluoroscopic guidance, the Quincke needle inserted for the L5 MBB was redirected until contact was made with os over the inferior and postero aspect of the sacrum, at the 6 o' clock position under the L5-S1 facet joint (Target area). After negative  aspiration for blood, 0.5 mL of the nerve block solution was injected without difficulty or complication. The needle was removed intact.  Nerve block solution: 0.2% PF-Ropivacaine + Triamcinolone (40 mg/mL) diluted to a  final concentration of 4 mg of Triamcinolone/mL of Ropivacaine The unused portion of the solution was discarded in the proper designated containers. Procedural Needles: 22-gauge, 7-inch, Quincke needles used for all levels.  Once the entire procedure was completed, the treated area was cleaned, making sure to leave some of the prepping solution back to take advantage of its long term bactericidal properties.   Illustration of the posterior view of the lumbar spine and the posterior neural structures. Laminae of L2 through S1 are labeled. DPRL5, dorsal primary ramus of L5; DPRS1, dorsal primary ramus of S1; DPR3, dorsal primary ramus of L3; FJ, facet (zygapophyseal) joint L3-L4; I, inferior articular process of L4; LB1, lateral branch of dorsal primary ramus of L1; IAB, inferior articular branches from L3 medial branch (supplies L4-L5 facet joint); IBP, intermediate branch plexus; MB3, medial branch of dorsal primary ramus of L3; NR3, third lumbar nerve root; S, superior articular process of L5; SAB, superior articular branches from L4 (supplies L4-5 facet joint also); TP3, transverse process of L3.  Vitals:   01/01/20 1116 01/01/20 1200 01/01/20 1210 01/01/20 1218  BP: (!) 143/49 (!) 144/77 138/81 (!) 141/80  Pulse: 74 96 (!) 109 (!) 102  Resp: 18 16 16 15   Temp: (!) 97.3 F (36.3 C)     SpO2: 100% 98% 99% 98%  Weight: 242 lb (109.8 kg)     Height: 5\' 9"  (1.753 m)        Start Time: 1209 hrs. End Time: 1214 hrs.  Imaging Guidance (Spinal):          Type of Imaging Technique: Fluoroscopy Guidance (Spinal) Indication(s): Assistance in needle guidance and placement for procedures requiring needle placement in or near specific anatomical locations not easily accessible without  such assistance. Exposure Time: Please see nurses notes. Contrast: None used. Fluoroscopic Guidance: I was personally present during the use of fluoroscopy. "Tunnel Vision Technique" used to obtain the best possible view of the target area. Parallax error corrected before commencing the procedure. "Direction-depth-direction" technique used to introduce the needle under continuous pulsed fluoroscopy. Once target was reached, antero-posterior, oblique, and lateral fluoroscopic projection used confirm needle placement in all planes. Images permanently stored in EMR. Interpretation: No contrast injected. I personally interpreted the imaging intraoperatively. Adequate needle placement confirmed in multiple planes. Permanent images saved into the patient's record.  Antibiotic Prophylaxis:   Anti-infectives (From admission, onward)   None     Indication(s): None identified  Post-operative Assessment:  Post-procedure Vital Signs:  Pulse/HCG Rate: (!) 102 (st)  Temp: (!) 97.3 F (36.3 C) Resp: 15 BP: (!) 141/80 SpO2: 98 %  EBL: None  Complications: No immediate post-treatment complications observed by team, or reported by patient.  Note: The patient tolerated the entire procedure well. A repeat set of vitals were taken after the procedure and the patient was kept under observation following institutional policy, for this type of procedure. Post-procedural neurological assessment was performed, showing return to baseline, prior to discharge. The patient was provided with post-procedure discharge instructions, including a section on how to identify potential problems. Should any problems arise concerning this procedure, the patient was given instructions to immediately contact us, at any time, without hesitation. In any case, we plan to contact the patient by telephone for a follow-up status report regarding this interventional procedure.  Comments:  No additional relevant information.  Plan of  Care  Orders:  Orders Placed This Encounter  Procedures  . LUMBAR FACET(MEDIAL BRANCH NERVE BLOCK) MBNB    Scheduling  Instructions:     Procedure: Lumbar facet block (AKA.: Lumbosacral medial branch nerve block)     Side: Left-sided     Level: L3-4, L4-5, & L5-S1 Facets (L2, L3, L4, L5, & S1 Medial Branch Nerves)     Sedation: Patient's choice.     Timeframe: Today    Order Specific Question:   Where will this procedure be performed?    Answer:   [facility]  . DG PA[facility] C-ARM 1-60 MIN NO REPORT    Intraoperative interpretation by procedural physician at [place].    Standing Status:   Standing    Number of Occurrences:   1    Order Specific Question:   Reason for exam:    Answer:   Assistance in needle guidance and placement for procedures requiring needle placement in or near specific anatomical locations not easily accessible without such assistance.  . Informed Consent Details: Physician/Practitioner Attestation; Transcribe to consent form and obtain patient signature    Nursing Order: Transcribe to consent form and obtain patient signature. Note: Always confirm laterality of pain with Anita Walker, before procedure.    Order Specific Question:   Physician/Practitioner attestation of informed consent for procedure/surgical case    Answer:   I, the physician/practitioner, attest that I have discussed with the patient the benefits, risks, side effects, alternatives, likelihood of achieving goals and potential problems during recovery for the procedure that I have provided informed consent.    Order Specific Question:   Procedure    Answer:   Lumbar Facet Block  under fluoroscopic guidance    Order Specific Question:   Physician/Practitioner performing the procedure    Answer:   Careem Yasui A. Dossie Arbour MD    Order Specific Question:   Indication/Reason    Answer:   Low Back Pain, with our without leg pain, due to Facet Joint Arthralgia (Joint Pain) Spondylosis  (Arthritis of the Spine), without myelopathy or radiculopathy (Nerve Damage).  . Care order/instruction: Please confirm that the patient has stopped the Coumadin (Warfarin) X 5 days prior to procedure or surgery.    Please confirm that the patient has stopped the Coumadin (Warfarin) X 5 days prior to procedure or surgery.    Standing Status:   Standing    Number of Occurrences:   1  . Provide equipment / supplies at bedside    "Block Tray" (Disposable  single use) Needle type: SpinalSpinal Amount/quantity: 4 Size: Medium (5-inch) Gauge: 25G    Standing Status:   Standing    Number of Occurrences:   1    Order Specific Question:   Specify    Answer:   Block Tray  . Bleeding precautions    Standing Status:   Standing    Number of Occurrences:   1   Chronic Opioid Analgesic:  None.  Highest recorded MME/day: 25 mg/day MME/day: 0 mg/day   Medications ordered for procedure: Meds ordered this encounter  Medications  . lidocaine (XYLOCAINE) 2 % (with pres) injection 400 mg  . DISCONTD: lactated ringers infusion 1,000 mL  . DISCONTD: midazolam (VERSED) 5 MG/5ML injection 1-2 mg    Make sure Flumazenil is available in the pyxis when using this medication. If oversedation occurs, administer 0.2 mg IV over 15 sec. If after 45 sec no response, administer 0.2 mg again over 1 min; may repeat at 1 min intervals; not to exceed 4 doses (1 mg)  . DISCONTD: fentaNYL (SUBLIMAZE) injection 25-50 mcg    Make sure  Narcan is available in the pyxis when using this medication. In the event of respiratory depression (RR< 8/min): Titrate NARCAN (naloxone) in increments of 0.1 to 0.2 mg IV at 2-3 minute intervals, until desired degree of reversal.  . ropivacaine (PF) 2 mg/mL (0.2%) (NAROPIN) injection 9 mL  . triamcinolone acetonide (KENALOG-40) injection 40 mg  . glycopyrrolate (ROBINUL) injection 0.2 mg   Medications administered: We administered lidocaine, ropivacaine (PF) 2 mg/mL (0.2%), triamcinolone  acetonide, and glycopyrrolate.  See the medical record for exact dosing, route, and time of administration.  Follow-up plan:   Return in about 2 weeks (around 01/15/2020) for (VV), (PP) Follow-up.       Interventional treatment options: Planned, scheduled, and/or pending:   None at this time.   Under consideration:  Palliative interventions only.  (See below)   Considerations:  NOTE:  Fast-track referral COUMADIN ANTICOAGULATION (Stop: 5 days  Restart: 2 hrs)  NO MORE RFA - Poor intra-procedure compliance leading to suboptimal outcomes (<1 month benefit) History of vasovagal with IV access.    Therapeutic/palliative (PRN):   Palliative right lumbar facet block #3  Palliative left lumbar facet block #4  Therapeutic right lumbar facet RFA (done 09/18/2019) (NO MORE RFA - Poor intra-op compliance)  Therapeutic left lumbar facet RFA (done 11/18/2019) (NO MORE RFA - Unreliable testing feedback)     Recent Visits Date Type Provider Dept  12/29/19 Telemedicine Milinda Pointer, [facility]  12/18/19 Telemedicine Milinda Pointer, MD [facility]  12/11/19 Office Visit Milinda Pointer, MD [facility]  11/18/19 Procedure visit Milinda Pointer, MD [facility]  10/06/19 Telemedicine Milinda Pointer, MD [facility]  Showing recent visits within past 90 days and meeting all other requirements Today's Visits Date Type Provider Dept  01/01/20 Procedure visit Milinda Pointer, MD [facility]  Showing today's visits and meeting all other requirements Future Appointments No visits were found meeting these conditions. Showing future appointments within next 90 days and meeting all other requirements  Disposition: Discharge home  Discharge (Date  Time): 01/01/2020; 1219 hrs.   Primary Care Physician: Einar Pheasant, MD Location: [facility] Note by: Gaspar Cola, MD Date:  01/01/2020; Time: 12:20 PM  Disclaimer:  Medicine is not an Chief Strategy Officer. The only guarantee in medicine is that nothing is guaranteed. It is important to note that the decision to proceed with this intervention was based on the information collected from the patient. The Data and conclusions were drawn from the patient's questionnaire, the interview, and the physical examination. Because the information was provided in large part by the patient, it cannot be guaranteed that it has not been purposely or unconsciously manipulated. Every effort has been made to obtain as much relevant data as possible for this evaluation. It is important to note that the conclusions that lead to this procedure are derived in large part from the available data. Always take into account that the treatment will also be dependent on availability of resources and existing treatment guidelines, considered by other Pain Management Practitioners as being common knowledge and practice, at the time of the intervention. For Medico-Legal purposes, it is also important to point out that variation in procedural techniques and pharmacological choices are the acceptable norm. The indications, contraindications, technique, and results of the above procedure should only be interpreted and judged by a Board-Certified Interventional Pain Specialist with extensive familiarity and expertise in the same exact procedure and technique.

## 2020-01-02 ENCOUNTER — Telehealth: Payer: Self-pay | Admitting: *Deleted

## 2020-01-02 NOTE — Telephone Encounter (Signed)
Voicemail left with patient to call office if there are any questions or concerns re; procedure on yesterday.

## 2020-01-07 ENCOUNTER — Other Ambulatory Visit: Payer: Self-pay

## 2020-01-07 ENCOUNTER — Other Ambulatory Visit (INDEPENDENT_AMBULATORY_CARE_PROVIDER_SITE_OTHER): Payer: Medicare Other

## 2020-01-07 DIAGNOSIS — Z7901 Long term (current) use of anticoagulants: Secondary | ICD-10-CM | POA: Diagnosis not present

## 2020-01-07 LAB — PROTIME-INR
INR: 3.5 ratio — ABNORMAL HIGH (ref 0.8–1.0)
Prothrombin Time: 38.6 s — ABNORMAL HIGH (ref 9.6–13.1)

## 2020-01-08 DIAGNOSIS — D3132 Benign neoplasm of left choroid: Secondary | ICD-10-CM | POA: Diagnosis not present

## 2020-01-09 ENCOUNTER — Other Ambulatory Visit: Payer: Self-pay

## 2020-01-09 ENCOUNTER — Other Ambulatory Visit: Payer: Self-pay | Admitting: Internal Medicine

## 2020-01-09 DIAGNOSIS — Z7901 Long term (current) use of anticoagulants: Secondary | ICD-10-CM

## 2020-01-09 NOTE — Progress Notes (Signed)
Opened in error

## 2020-01-10 ENCOUNTER — Other Ambulatory Visit: Payer: Self-pay | Admitting: Internal Medicine

## 2020-01-10 ENCOUNTER — Other Ambulatory Visit
Admission: RE | Admit: 2020-01-10 | Discharge: 2020-01-10 | Disposition: A | Discharge status: Home / Self Care | Payer: Medicare Other | Attending: Internal Medicine | Admitting: Internal Medicine

## 2020-01-10 DIAGNOSIS — Z7901 Long term (current) use of anticoagulants: Secondary | ICD-10-CM

## 2020-01-10 LAB — PROTIME-INR
INR: 2.4 — ABNORMAL HIGH (ref 0.8–1.2)
Prothrombin Time: 25.1 seconds — ABNORMAL HIGH (ref 11.4–15.2)

## 2020-01-10 NOTE — Progress Notes (Signed)
Order placed for pt/inr to be drawn at [facility]

## 2020-01-12 ENCOUNTER — Telehealth: Payer: Self-pay | Admitting: Internal Medicine

## 2020-01-12 NOTE — Telephone Encounter (Signed)
Anita Walker from [facility] faxed over orders to be signed by the physician  on 01/12/2020. Orders given to [place].

## 2020-01-14 ENCOUNTER — Other Ambulatory Visit: Payer: Medicare Other

## 2020-01-17 DIAGNOSIS — Z23 Encounter for immunization: Secondary | ICD-10-CM | POA: Diagnosis not present

## 2020-01-18 NOTE — Progress Notes (Signed)
Patient: Anita Walker  Service Category: E/M  Provider: Gaspar Cola, MD  DOB: 02-11-1941  DOS: 01/19/2020  Location: Office  MRN: [id number]  Setting: Ambulatory outpatient  Referring Provider: Einar Pheasant, MD  Type: Established Patient  Specialty: Interventional Pain Management  PCP: Einar Pheasant, MD  Location: Remote location  Delivery: TeleHealth     Virtual Encounter - Pain Management PROVIDER NOTE: Information contained herein reflects review and annotations entered in association with encounter. Interpretation of such information and data should be left to medically-trained personnel. Information provided to patient can be located elsewhere in the medical record under "Patient Instructions". Document created using STT-dictation technology, any transcriptional errors that may result from process are unintentional.    Contact & Pharmacy Preferred: [phone] Home: [phone] (home) Mobile: [phone] (mobile) E-mail: [email or web address]  [place], [place] [address] [facility] [address] [place] [address] Phone: [phone] Fax: [phone]   Pre-screening  Anita Walker offered "in-person" vs "virtual" encounter. She indicated preferring virtual for this encounter.   Reason COVID-19*  Social distancing based on CDC and AMA recommendations.   I contacted Anita Walker on 01/19/2020 via telephone.      I clearly identified myself as Gaspar Cola, MD. I verified that I was speaking with the correct person using two identifiers (Name: Anita Walker, and date of birth: May 04, 1940).  Consent I sought verbal advanced consent from Anita Walker for virtual visit interactions. I informed Anita Walker of possible security and privacy concerns, risks, and limitations associated with providing "not-in-person" medical evaluation and management services. I also informed Anita Walker of the availability of "in-person"  appointments. Finally, I informed her that there would be a charge for the virtual visit and that she could be  personally, fully or partially, financially responsible for it. Anita Walker expressed understanding and agreed to proceed.   Historic Elements   Anita Walker is a 79 y.o. year old, female patient evaluated today after our last contact on 01/01/2020. Anita Walker  has a past medical history of Anemia, Arthritis, Atrial fibrillation ([place]), Breast cancer ([place]), Diabetes mellitus ([place]), Edema, Gastric ulcer, GERD (gastroesophageal reflux disease), HOH (hard of hearing), Hypercholesterolemia, Hypertension, Personal history of chemotherapy, and Pulmonary emboli ([place]). She also  has a past surgical history that includes Cataract extraction w/PHACO (Right, 12/26/2016); Cataract extraction w/PHACO (Left, 01/23/2017); Breast lumpectomy (03/06/1990); and Colonoscopy with propofol (N/A, 07/08/2019). Anita Walker has a current medication list which includes the following prescription(s): acyclovir, acyclovir ointment, amlodipine, contour test, diphenhydramine, ferosul, fluconazole, hydrocodone-acetaminophen, losartan, multivitamin with minerals, pantoprazole, simvastatin, and warfarin. She  reports that she has never smoked. She has never used smokeless tobacco. She reports that she does not drink alcohol and does not use drugs. Anita Walker is allergic to penicillins and penicillin v potassium.   HPI  Today, she is being contacted for a post-procedure assessment.  The patient refers that after the left lumbar facet block she has not had any more pain on the left side, but she does have pain in the center of the lower back.  She refers that this pain does not travel to the hips or down the legs.  Her current pain is midline.  Reviewing the x-rays from the procedure, I can see that the patient has her spinous processes contacting each other at the L5-S1, L4-5, and L3-4 levels.  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  I will go ahead and schedule her to return to localize the pain that she is experiencing and injecting some local anesthetic and steroid around the interspinous process at those levels to see if that can help with eliminating the pain, at least for the duration of the local anesthetics.  That would at least help [place] with the etiology of this particular pain.  Post-Procedure Evaluation  Procedure (01/01/2020): Palliative left lumbar facet block #3 under fluoroscopic guidance, no sedation Pre-procedure pain level: 1/10 Post-procedure: 0/10 (100% relief)  Sedation: None.  Effectiveness during initial hour after procedure(Ultra-Short Term Relief):  100%.  Local anesthetic used: Long-acting (4-6 hours) Effectiveness: Defined as any analgesic benefit obtained secondary to the administration of local anesthetics. This carries significant diagnostic value as to the etiological location, or anatomical origin, of the pain. Duration of benefit is expected to coincide with the duration of the local anesthetic used.  Effectiveness during initial 4-6 hours after procedure(Short-Term Relief):  100%.  Long-term benefit: Defined as any relief past the pharmacologic duration of the local anesthetics.  Effectiveness past the initial 6 hours after procedure(Long-Term Relief):   100% of the pain on the left side, but the patient has experienced recurrence of the midline pain..  Current benefits: Defined as benefit that persist at this time.   Analgesia:  90-100% better for the pain on the left side, but 0 benefit on the midline pain. Function: Back to baseline ROM: Back to baseline  Pharmacotherapy Assessment  Analgesic: None.  Highest recorded MME/day: 25 mg/day MME/day: 0 mg/day   Monitoring: [place] PMP: PDMP reviewed during this encounter.       Pharmacotherapy: No side-effects or adverse reactions reported. Compliance: No problems identified. Effectiveness: Clinically  acceptable. Plan: Refer to "POC".  UDS: No results found for: SUMMARY  Laboratory Chemistry Profile   Renal Lab Results  Component Value Date   BUN 14 11/04/2019   CREATININE 0.81 11/04/2019   GFR 67.76 11/04/2019   GFRAA >60 08/07/2018   GFRNONAA >60 08/07/2018     Hepatic Lab Results  Component Value Date   AST 20 12/27/2019   ALT 15 12/27/2019   ALBUMIN 4.0 12/27/2019   ALKPHOS 73 12/27/2019     Electrolytes Lab Results  Component Value Date   NA 135 11/04/2019   K 4.1 11/04/2019   CL 102 11/04/2019   CALCIUM 9.5 11/04/2019     Bone No results found for: VD25OH, VD125OH2TOT, SF6812XN1, ZG0174BS4, 25OHVITD1, 25OHVITD2, 25OHVITD3, TESTOFREE, TESTOSTERONE   Inflammation (CRP: Acute Phase) (ESR: Chronic Phase) Lab Results  Component Value Date   CRP <0.8 10/31/2016   ESRSEDRATE 5 10/31/2016   LATICACIDVEN 1.1 10/30/2016       Note: Above Lab results reviewed.  Imaging  DG PAIN CLINIC C-ARM 1-60 MIN NO REPORT Fluoro was used, but no Radiologist interpretation will be provided.  Please refer to "NOTES" tab for provider progress note.    Assessment  The primary encounter diagnosis was Chronic pain syndrome. 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  Plan of Care  Problem-specific:  No problem-specific Assessment & Plan notes found for this encounter.  Ms. Emmajane Altamura Walker has a current medication list which includes the following long-term medication(s): amlodipine, diphenhydramine, ferosul, losartan,  pantoprazole, and simvastatin.  Pharmacotherapy (Medications Ordered): No orders of the defined types were placed in this encounter.  Orders:  Orders Placed This  Encounter  Procedures  . Injection tendon or ligament    Standing Status:   Future    Standing Expiration Date:   04/20/2020    Scheduling Instructions:     Type of Block:  Interspinous process ligament injection     Side: Midline     Sedation: No Sedation.     Timeframe: ASAA  . Blood Thinner Instructions to Nursing    Always make sure patient has clearance from prescribing physician to stop blood thinners for interventional therapies. If the patient requires a Lovenox-bridge therapy, make sure arrangements are made to institute it with the assistance of the PCP.    Scheduling Instructions:     Have Anita Walker stop the Coumadin (Warfarin) X 5 days prior to procedure or surgery.   Follow-up plan:   Return for Procedure (no sedation): (ML) interspinous process inj. (L3-S1) #1, (Blood Thinner Protocol).      Interventional treatment options: Planned, scheduled, and/or pending:   Diagnostic midline interspinous process injection of L3-4, L4-5, and L5-S1 #1    Under consideration:  Palliative interventions only.  (See below)   Considerations:  NOTE:  Fast-track referral COUMADIN ANTICOAGULATION (Stop: 5 days  Restart: 2 hrs)  NO MORE RFA - Poor intra-procedure compliance leading to suboptimal outcomes (<1 month benefit) History of vasovagal with IV access.    Therapeutic/palliative (PRN):   Palliative right lumbar facet block #3  Palliative left lumbar facet block #4  Therapeutic right lumbar facet RFA (done 09/18/2019) (NO MORE RFA - Poor intra-op compliance)  Therapeutic left lumbar facet RFA (done 11/18/2019) (NO MORE RFA - Unreliable testing feedback)     Recent Visits Date Type Provider Dept  01/01/20 Procedure visit Milinda Pointer, MD [facility]  12/29/19 Telemedicine Milinda Pointer, MD [facility]  12/18/19 Telemedicine Milinda Pointer, [facility]  12/11/19 Office Visit Milinda Pointer, MD [facility]  11/18/19  Procedure visit Milinda Pointer, MD [facility]  Showing recent visits within past 90 days and meeting all other requirements Today's Visits Date Type Provider Dept  01/19/20 Telemedicine Milinda Pointer, MD [facility]  Showing today's visits and meeting all other requirements Future Appointments No visits were found meeting these conditions. Showing future appointments within next 90 days and meeting all other requirements  I discussed the assessment and treatment plan with the patient. The patient was provided an opportunity to ask questions and all were answered. The patient agreed with the plan and demonstrated an understanding of the instructions.  Patient advised to call back or seek an in-person evaluation if the symptoms or condition worsens.  Duration of encounter: 12 minutes.  Note by: Gaspar Cola, MD Date: 01/19/2020; Time: 10:46 AM

## 2020-01-19 ENCOUNTER — Other Ambulatory Visit: Payer: Self-pay | Admitting: Internal Medicine

## 2020-01-19 ENCOUNTER — Ambulatory Visit: Payer: Medicare Other | Attending: Pain Medicine | Admitting: Pain Medicine

## 2020-01-19 ENCOUNTER — Other Ambulatory Visit: Payer: Self-pay

## 2020-01-19 DIAGNOSIS — G8929 Other chronic pain: Secondary | ICD-10-CM | POA: Diagnosis not present

## 2020-01-19 DIAGNOSIS — M47816 Spondylosis without myelopathy or radiculopathy, lumbar region: Secondary | ICD-10-CM

## 2020-01-19 DIAGNOSIS — M4820 Kissing spine, site unspecified: Secondary | ICD-10-CM | POA: Diagnosis not present

## 2020-01-19 DIAGNOSIS — Z7901 Long term (current) use of anticoagulants: Secondary | ICD-10-CM | POA: Diagnosis not present

## 2020-01-19 DIAGNOSIS — M5137 Other intervertebral disc degeneration, lumbosacral region: Secondary | ICD-10-CM

## 2020-01-19 DIAGNOSIS — S32030S Wedge compression fracture of third lumbar vertebra, sequela: Secondary | ICD-10-CM | POA: Diagnosis not present

## 2020-01-19 DIAGNOSIS — M545 Low back pain, unspecified: Secondary | ICD-10-CM | POA: Diagnosis not present

## 2020-01-19 DIAGNOSIS — G894 Chronic pain syndrome: Secondary | ICD-10-CM

## 2020-01-19 NOTE — Patient Instructions (Signed)
____________________________________________________________________________________________  Preparing for your procedure (without sedation)  Procedure appointments are limited to planned procedures: . No Prescription Refills. . No disability issues will be discussed. . No medication changes will be discussed.  Instructions: . Oral Intake: Do not eat or drink anything for at least 6 hours prior to your procedure. (Exception: Blood Pressure Medication. See below.) . Transportation: Unless otherwise stated by your physician, you may drive yourself after the procedure. . Blood Pressure Medicine: Do not forget to take your blood pressure medicine with a sip of water the morning of the procedure. If your Diastolic (lower reading)is above 100 mmHg, elective cases will be cancelled/rescheduled. . Blood thinners: These will need to be stopped for procedures. Notify our staff if you are taking any blood thinners. Depending on which one you take, there will be specific instructions on how and when to stop it. . Diabetics on insulin: Notify the staff so that you can be scheduled 1st case in the morning. If your diabetes requires high dose insulin, take only  of your normal insulin dose the morning of the procedure and notify the staff that you have done so. . Preventing infections: Shower with an antibacterial soap the morning of your procedure.  . Build-up your immune system: Take 1000 mg of Vitamin C with every meal (3 times a day) the day prior to your procedure. . Antibiotics: Inform the staff if you have a condition or reason that requires you to take antibiotics before dental procedures. . Pregnancy: If you are pregnant, call and cancel the procedure. . Sickness: If you have a cold, fever, or any active infections, call and cancel the procedure. . Arrival: You must be in the facility at least 30 minutes prior to your scheduled procedure. . Children: Do not bring any children with you. . Dress  appropriately: Bring dark clothing that you would not mind if they get stained. . Valuables: Do not bring any jewelry or valuables.  Reasons to call and reschedule or cancel your procedure: (Following these recommendations will minimize the risk of a serious complication.) . Surgeries: Avoid having procedures within 2 weeks of any surgery. (Avoid for 2 weeks before or after any surgery). . Flu Shots: Avoid having procedures within 2 weeks of a flu shots or . (Avoid for 2 weeks before or after immunizations). . Barium: Avoid having a procedure within 7-10 days after having had a radiological study involving the use of radiological contrast. (Myelograms, Barium swallow or enema study). . Heart attacks: Avoid any elective procedures or surgeries for the initial 6 months after a "Myocardial Infarction" (Heart Attack). . Blood thinners: It is imperative that you stop these medications before procedures. Let us know if you if you take any blood thinner.  . Infection: Avoid procedures during or within two weeks of an infection (including chest colds or gastrointestinal problems). Symptoms associated with infections include: Localized redness, fever, chills, night sweats or profuse sweating, burning sensation when voiding, cough, congestion, stuffiness, runny nose, sore throat, diarrhea, nausea, vomiting, cold or Flu symptoms, recent or current infections. It is specially important if the infection is over the area that we intend to treat. . Heart and lung problems: Symptoms that may suggest an active cardiopulmonary problem include: cough, chest pain, breathing difficulties or shortness of breath, dizziness, ankle swelling, uncontrolled high or unusually low blood pressure, and/or palpitations. If you are experiencing any of these symptoms, cancel your procedure and contact your primary care physician for an evaluation.  Remember:  Regular   Business hours are:  Monday to Thursday 8:00 AM to 4:00  PM  Provider's Schedule: Milinda Pointer, MD:  Procedure days: Tuesday and Thursday 7:30 AM to 4:00 PM  Gillis Santa, MD:  Procedure days: Monday and Wednesday 7:30 AM to 4:00 PM ____________________________________________________________________________________________   ____________________________________________________________________________________________  Blood Thinners  IMPORTANT NOTICE:  If you take any of these, make sure to notify the nursing staff.  Failure to do so may result in injury.  Recommended time intervals to stop and restart blood-thinners, before & after invasive procedures  Generic Name Brand Name Stop Time. Must be stopped at least this long before procedures. After procedures, wait at least this long before re-starting.  Abciximab Reopro 15 days 2 hrs  Alteplase Activase 10 days 10 days  Anagrelide Agrylin    Apixaban Eliquis 3 days 6 hrs  Cilostazol Pletal 3 days 5 hrs  Clopidogrel Plavix 7-10 days 2 hrs  Dabigatran Pradaxa 5 days 6 hrs  Dalteparin Fragmin 24 hours 4 hrs  Dipyridamole Aggrenox 11days 2 hrs  Edoxaban Lixiana; Savaysa 3 days 2 hrs  Enoxaparin  Lovenox 24 hours 4 hrs  Eptifibatide Integrillin 8 hours 2 hrs  Fondaparinux  Arixtra 72 hours 12 hrs  Prasugrel Effient 7-10 days 6 hrs  Reteplase Retavase 10 days 10 days  Rivaroxaban Xarelto 3 days 6 hrs  Ticagrelor Brilinta 5-7 days 6 hrs  Ticlopidine Ticlid 10-14 days 2 hrs  Tinzaparin Innohep 24 hours 4 hrs  Tirofiban Aggrastat 8 hours 2 hrs  Warfarin Coumadin 5 days 2 hrs   Other medications with blood-thinning effects  Product indications Generic (Brand) names Note  Cholesterol Lipitor Stop 4 days before procedure  Blood thinner (injectable) Heparin (LMW or LMWH Heparin) Stop 24 hours before procedure  Cancer Ibrutinib (Imbruvica) Stop 7 days before procedure  Malaria/Rheumatoid Hydroxychloroquine (Plaquenil) Stop 11 days before procedure  Thrombolytics  10 days before or  after procedures   Over-the-counter (OTC) Products with blood-thinning effects  Product Common names Stop Time  Aspirin > 325 mg Goody Powders, Excedrin, etc. 11 days  Aspirin ? 81 mg  7 days  Fish oil  4 days  Garlic supplements  7 days  Ginkgo biloba  36 hours  Ginseng  24 hours  NSAIDs Ibuprofen, Naprosyn, etc. 3 days  Vitamin E  4 days   ____________________________________________________________________________________________

## 2020-01-20 ENCOUNTER — Other Ambulatory Visit: Payer: Self-pay

## 2020-01-20 ENCOUNTER — Other Ambulatory Visit (INDEPENDENT_AMBULATORY_CARE_PROVIDER_SITE_OTHER): Payer: Medicare Other

## 2020-01-20 DIAGNOSIS — Z7901 Long term (current) use of anticoagulants: Secondary | ICD-10-CM | POA: Diagnosis not present

## 2020-01-20 LAB — PROTIME-INR
INR: 2.3 ratio — ABNORMAL HIGH (ref 0.8–1.0)
Prothrombin Time: 25.9 s — ABNORMAL HIGH (ref 9.6–13.1)

## 2020-01-22 DIAGNOSIS — L249 Irritant contact dermatitis, unspecified cause: Secondary | ICD-10-CM | POA: Diagnosis not present

## 2020-01-26 ENCOUNTER — Other Ambulatory Visit: Payer: Self-pay

## 2020-01-26 ENCOUNTER — Ambulatory Visit (INDEPENDENT_AMBULATORY_CARE_PROVIDER_SITE_OTHER): Payer: Medicare Other | Admitting: Internal Medicine

## 2020-01-26 DIAGNOSIS — Z7901 Long term (current) use of anticoagulants: Secondary | ICD-10-CM

## 2020-01-26 DIAGNOSIS — E7800 Pure hypercholesterolemia, unspecified: Secondary | ICD-10-CM | POA: Diagnosis not present

## 2020-01-26 DIAGNOSIS — I10 Essential (primary) hypertension: Secondary | ICD-10-CM | POA: Diagnosis not present

## 2020-01-26 DIAGNOSIS — Z9181 History of falling: Secondary | ICD-10-CM | POA: Diagnosis not present

## 2020-01-26 DIAGNOSIS — Z853 Personal history of malignant neoplasm of breast: Secondary | ICD-10-CM | POA: Diagnosis not present

## 2020-01-26 DIAGNOSIS — G8929 Other chronic pain: Secondary | ICD-10-CM | POA: Insufficient documentation

## 2020-01-26 DIAGNOSIS — E1142 Type 2 diabetes mellitus with diabetic polyneuropathy: Secondary | ICD-10-CM | POA: Diagnosis not present

## 2020-01-26 DIAGNOSIS — K130 Diseases of lips: Secondary | ICD-10-CM | POA: Diagnosis not present

## 2020-01-26 DIAGNOSIS — W19XXXA Unspecified fall, initial encounter: Secondary | ICD-10-CM

## 2020-01-26 DIAGNOSIS — M4609 Spinal enthesopathy, multiple sites in spine: Secondary | ICD-10-CM | POA: Insufficient documentation

## 2020-01-26 DIAGNOSIS — M4820 Kissing spine, site unspecified: Secondary | ICD-10-CM | POA: Insufficient documentation

## 2020-01-26 NOTE — Progress Notes (Addendum)
[name], female   DOB: 1940/11/05, 79 y.o.   MRN: [id number]   Subjective:    [name], female    DOB: 04/07/1940, 79 y.o.   MRN: [id number]  HPI This visit occurred during the SARS-CoV-2 public health emergency.  Safety protocols were in place, including screening questions prior to the visit, additional usage of staff PPE, and extensive cleaning of exam room while observing appropriate contact time as indicated for disinfecting solutions.  Patient here for a scheduled follow up.  She reports she is doing relatively well.  [place] 01/17/20.  No LOC.  Scraped her forearm.  No other injuries.  Concern over her high risk for falling and unsteadiness.  Discussed using a cane/walker. Discussed PT.  She declines.  No syncope or near syncopal episodes.  Off coumadin in preparation for back injection tomorrow.  Plans to restart coumadin tomorrow.  She saw dermatology for her lips.  Using HC ointment.  Has improved some.  Planning f/u to discuss persistent symptoms.  No chest pain or sob reported.  No acid reflux or abdominal reported. Bowels stable.      Past Medical History:  Diagnosis Date  . Anemia   . Arthritis   . Atrial fibrillation ([place])   . Breast cancer ([place])    s/p lumpectomy 1992.  s/p chemo and xrt left breast  . Diabetes mellitus ([place])   . Edema    feet/legs  . Gastric ulcer   . GERD (gastroesophageal reflux disease)   . HOH (hard of hearing)    aides  . Hypercholesterolemia   . Hypertension   . Personal history of chemotherapy   . Pulmonary emboli [facility])    Past Surgical History:  Procedure Laterality Date  . BREAST LUMPECTOMY  03/06/1990   left breast  . CATARACT EXTRACTION W/PHACO Right 12/26/2016   Procedure: CATARACT EXTRACTION PHACO AND INTRAOCULAR LENS PLACEMENT (IOC)-RIGHT DIABETIC;  Surgeon: Birder Robson, MD;  Location: [facility] ORS;  Service: Ophthalmology;  Laterality: Right;  [place] 00:47 AP% 24.5 CDE 11.62 Fluid pack lot # [id number]    . CATARACT EXTRACTION W/PHACO Left 01/23/2017   Procedure: CATARACT EXTRACTION PHACO AND INTRAOCULAR LENS PLACEMENT (IOC);  Surgeon: Birder Robson, MD;  Location: [facility] ORS;  Service: Ophthalmology;  Laterality: Left;  [place] 00:50 AP% 16.1 CDE 8.18 Fluid pack lot #[id number]  . COLONOSCOPY WITH PROPOFOL N/A 07/08/2019   Procedure: COLONOSCOPY WITH PROPOFOL;  Surgeon: Lucilla Lame, MD;  Location: [facility] ENDOSCOPY;  Service: Endoscopy;  Laterality: N/A;   Family History  Problem Relation Age of Onset  . Heart disease Father   . Heart disease Brother        s/p CABG  . Breast cancer Daughter   . Colon cancer Neg Hx    Social History   Socioeconomic History  . Marital status: Widowed    Spouse name: Not on file  . Number of children: Not on file  . Years of education: Not on file  . Highest education level: Not on file  Occupational History  . Not on file  Tobacco Use  . Smoking status: Never Smoker  . Smokeless tobacco: Never Used  Vaping Use  . Vaping Use: Never used  Substance and Sexual Activity  . Alcohol use: No    Alcohol/week: 0.0 standard drinks  . Drug use: No  . Sexual activity: Never  Other Topics Concern  . Not on file  Social History Narrative  . Not on file  Social Determinants of Health   Financial Resource Strain: Low Risk   . Difficulty of Paying Living Expenses: Not hard at all  Food Insecurity: No Food Insecurity  . Worried About Charity fundraiser in the Last Year: Never true  . Ran Out of Food in the Last Year: Never true  Transportation Needs: No Transportation Needs  . Lack of Transportation (Medical): No  . Lack of Transportation (Non-Medical): No  Physical Activity:   . Days of Exercise per Week: Not on file  . Minutes of Exercise per Session: Not on file  Stress: No Stress Concern Present  . Feeling of Stress : Not at all  Social Connections: Unknown  . Frequency of Communication with Friends and Family: More than three times a week  .  Frequency of Social Gatherings with Friends and Family: Not on file  . Attends Religious Services: Not on file  . Active Member of Clubs or Organizations: Not on file  . Attends Archivist Meetings: Not on file  . Marital Status: Not on file    Outpatient Encounter Medications as of 01/26/2020  Medication Sig  . amLODipine (NORVASC) 2.5 MG tablet TAKE 1 TABLET(2.5 MG) BY MOUTH DAILY  . CONTOUR TEST test strip USE TO TEST BLOOD SUGAR ONCE EVERY WEEK  . diphenhydrAMINE (BENADRYL) 25 mg capsule Take 25 mg at bedtime as needed by mouth for sleep.   Marland Kitchen FEROSUL 325 (65 Fe) MG tablet TAKE 1 TABLET(325 MG) BY MOUTH DAILY  . hydrocortisone 2.5 % ointment Apply topically.  Marland Kitchen losartan (COZAAR) 100 MG tablet Take 1 tablet (100 mg total) by mouth daily.  . Multiple Vitamin (MULTIVITAMIN WITH MINERALS) TABS tablet Take 1 tablet daily by mouth.  . pantoprazole (PROTONIX) 40 MG tablet TAKE 1 TABLET(40 MG) BY MOUTH DAILY  . simvastatin (ZOCOR) 10 MG tablet TAKE 1 TABLET BY MOUTH EVERY DAY  . warfarin (COUMADIN) 2.5 MG tablet Take 2.5 mg by mouth.  . [DISCONTINUED] acyclovir (ZOVIRAX) 400 MG tablet acyclovir 400 mg tablet  . [DISCONTINUED] acyclovir ointment (ZOVIRAX) 5 % Apply 1 application topically 4 (four) times daily.  . [DISCONTINUED] fluconazole (DIFLUCAN) 200 MG tablet Take 200 mg by mouth daily.  . [DISCONTINUED] HYDROcodone-acetaminophen (NORCO/VICODIN) 5-325 MG tablet Take 1 tablet by mouth every 6 (six) hours as needed.   No facility-administered encounter medications on file as of 01/26/2020.    Review of Systems  Constitutional: Negative for appetite change and unexpected weight change.  HENT: Negative for congestion and sinus pressure.   Respiratory: Negative for cough, chest tightness and shortness of breath.   Cardiovascular: Negative for chest pain and palpitations.       No increased leg swelling.    Gastrointestinal: Negative for abdominal pain, diarrhea, nausea and  vomiting.  Genitourinary: Negative for difficulty urinating and dysuria.  Musculoskeletal: Negative for myalgias.       Being followed - pain clinic for chronic back pain.   Skin: Negative for color change and rash.  Neurological: Negative for dizziness, light-headedness and headaches.  Psychiatric/Behavioral: Negative for agitation and dysphoric mood.       Objective:    Physical Exam Vitals reviewed.  Constitutional:      General: She is not in acute distress.    Appearance: Normal appearance.  HENT:     Head: Normocephalic and atraumatic.     Right Ear: External ear normal.     Left Ear: External ear normal.  Eyes:     General: No scleral  icterus.       Right eye: No discharge.        Left eye: No discharge.     Conjunctiva/sclera: Conjunctivae normal.  Neck:     Thyroid: No thyromegaly.  Cardiovascular:     Rate and Rhythm: Normal rate and regular rhythm.  Pulmonary:     Effort: No respiratory distress.     Breath sounds: Normal breath sounds. No wheezing.  Abdominal:     General: Bowel sounds are normal.     Palpations: Abdomen is soft.     Tenderness: There is no abdominal tenderness.  Musculoskeletal:        General: No swelling or tenderness.     Cervical back: Neck supple. No tenderness.  Lymphadenopathy:     Cervical: No cervical adenopathy.  Skin:    Findings: No erythema or rash.  Neurological:     Mental Status: She is alert.  Psychiatric:        Mood and Affect: Mood normal.        Behavior: Behavior normal.     BP 128/70   Pulse 90   Temp 98.1 F (36.7 C) (Oral)   Resp 16   Ht $R'5\' 9"'gs$  (1.753 m)   Wt 242 lb 6.4 oz (110 kg)   SpO2 98%   BMI 35.80 kg/m  Wt Readings from Last 3 Encounters:  01/27/20 240 lb (108.9 kg)  01/26/20 242 lb 6.4 oz (110 kg)  01/01/20 242 lb (109.8 kg)     Lab Results  Component Value Date   WBC 9.2 12/02/2019   HGB 14.1 12/02/2019   HCT 41.9 12/02/2019   PLT 238.0 12/02/2019   GLUCOSE 119 (H) 11/04/2019    CHOL 156 12/11/2018   TRIG 100.0 12/11/2018   HDL 55.90 12/11/2018   LDLCALC 80 12/11/2018   ALT 15 12/27/2019   AST 20 12/27/2019   NA 135 11/04/2019   K 4.1 11/04/2019   CL 102 11/04/2019   CREATININE 0.81 11/04/2019   BUN 14 11/04/2019   CO2 26 11/04/2019   TSH 3.15 05/06/2019   INR 1.9 (H) 02/05/2020   HGBA1C 5.8 11/04/2019   MICROALBUR <0.7 11/14/2017       Assessment & Plan:   Problem List Items Addressed This Visit    Lip lesion    Followed by dermatology.       Hypercholesteremia    On simvastatin.  Low cholesterol diet and exercise.  Follow lipid panel and liver function tests.        History of breast cancer    12/05/19 mammogram [facility].       Fall    Recent fall.  At high risk.  Discussed using a cane/walker.  Discussed PT.        Essential hypertension    Blood pressure on my check today 130/78.  Continue amlodipine.  Follow pressures.  Follow metabolic panel.       Diabetic polyneuropathy associated with type 2 diabetes mellitus (HCC)    Low carb diet and exercise.  Follow met b and a1c.       Chronic anticoagulation (Coumadin) (Chronic)    Off coumadin with plans for back injection tomorrow.  Will restart coumadin tomorrow per pain clinic.       At high risk for falls (Chronic)    Recent fall.  No syncope or near syncopal episodes.  Scraped her arm.  No head injury.  Discussed using her walker.  Discussed PT.  She declines.  Avigail Pilling, MD 

## 2020-01-26 NOTE — Progress Notes (Signed)
PROVIDER NOTE: Information contained herein reflects review and annotations entered in association with encounter. Interpretation of such information and data should be left to medically-trained personnel. Information provided to patient can be located elsewhere in the medical record under "Patient Instructions". Document created using STT-dictation technology, any transcriptional errors that may result from process are unintentional.    Patient: Anita Walker  Service Category: Procedure  Provider: Gaspar Cola, MD  DOB: 08-19-1940  DOS: 01/27/2020  Location: [facility]  MRN: [id number]  Setting: Ambulatory - outpatient  Referring Provider: Einar Pheasant, MD  Type: Established Patient  Specialty: Interventional Pain Management  PCP: Einar Pheasant, MD   Primary Reason for Visit: Interventional Pain Management Treatment. CC: Back Pain  Procedure:          Anesthesia, Analgesia, Anxiolysis:  Type: Ligament/Tendon sheath (56812) Injection.  #1  Purpose: Diagnostic Target Area: Interspinous ligament between L3 and S1 Region: Lumbosacral Approach: Percutanous Level: L2-3 Laterality: Midline  Type: Moderate (Conscious) Sedation combined with Local Anesthesia Indication(s): Analgesia and Anxiety Local Anesthetic: Lidocaine 1-2% Route: Intravenous (IV) IV Access: Secured Sedation: Meaningful verbal contact was maintained at all times during the procedure   Position: Supine   Indications: 1. Kissing spine syndrome (L3-S1)   2. 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 DDD (degenerative disc disease), lumbosacral   4. Grade 1 Retrolisthesis of L3/L4   5. Chronic low back pain (Midline) w/o sciatica   6. Baastrup's syndrome (3-S1)    Pain Score: Pre-procedure: 2 /10 Post-procedure: 0-No pain/10   Pre-op H&P Assessment:  Anita Walker is a 79 y.o. (year old), female patient, seen today for interventional treatment. She  has a past surgical history  that includes Cataract extraction w/PHACO (Right, 12/26/2016); Cataract extraction w/PHACO (Left, 01/23/2017); Breast lumpectomy (03/06/1990); and Colonoscopy with propofol (N/A, 07/08/2019). Anita Walker has a current medication list which includes the following prescription(s): amlodipine, contour test, diphenhydramine, ferosul, hydrocodone-acetaminophen, hydrocortisone, losartan, multivitamin with minerals, pantoprazole, simvastatin, and warfarin, and the following Facility-Administered Medications: fentanyl and midazolam. Her primarily concern today is the Back Pain  Initial Vital Signs:  Pulse/HCG Rate: 75ECG Heart Rate: 81 Temp: (!) 97.2 F (36.2 C) Resp: 13 BP: (!) 131/57 SpO2: 99 %  BMI: Estimated body mass index is 35.44 kg/m as calculated from the following:   Height as of this encounter: 5\' 9"  (1.753 m).   Weight as of this encounter: 240 lb (108.9 kg).  Risk Assessment: Allergies: Reviewed. She is allergic to penicillins and penicillin v potassium.  Allergy Precautions: None required Coagulopathies: Reviewed. None identified.  Blood-thinner therapy: None at this time Active Infection(s): Reviewed. None identified. Anita Walker is afebrile  Site Confirmation: Anita Walker was asked to confirm the procedure and laterality before marking the site Procedure checklist: Completed Consent: Before the procedure and under the influence of no sedative(s), amnesic(s), or anxiolytics, the patient was informed of the treatment options, risks and possible complications. To fulfill our ethical and legal obligations, as recommended by the American Medical Association's Code of Ethics, I have informed the patient of my clinical impression; the nature and purpose of the treatment or procedure; the risks, benefits, and possible complications of the intervention; the alternatives, including doing nothing; the risk(s) and benefit(s) of the alternative treatment(s) or procedure(s); and the risk(s) and benefit(s)  of doing nothing. The patient was provided information about the general risks and possible complications associated with the procedure. These may include, but are not limited to: failure  to achieve desired goals, infection, bleeding, organ or nerve damage, allergic reactions, paralysis, and death. In addition, the patient was informed of those risks and complications associated to the procedure, such as failure to decrease pain; infection; bleeding; organ or nerve damage with subsequent damage to sensory, motor, and/or autonomic systems, resulting in permanent pain, numbness, and/or weakness of one or several areas of the body; allergic reactions; (i.e.: anaphylactic reaction); and/or death. Furthermore, the patient was informed of those risks and complications associated with the medications. These include, but are not limited to: allergic reactions (i.e.: anaphylactic or anaphylactoid reaction(s)); adrenal axis suppression; blood sugar elevation that in diabetics may result in ketoacidosis or comma; water retention that in patients with history of congestive heart failure may result in shortness of breath, pulmonary edema, and decompensation with resultant heart failure; weight gain; swelling or edema; medication-induced neural toxicity; particulate matter embolism and blood vessel occlusion with resultant organ, and/or nervous system infarction; and/or aseptic necrosis of one or more joints. Finally, the patient was informed that Medicine is not an exact science; therefore, there is also the possibility of unforeseen or unpredictable risks and/or possible complications that may result in a catastrophic outcome. The patient indicated having understood very clearly. We have given the patient no guarantees and we have made no promises. Enough time was given to the patient to ask questions, all of which were answered to the patient's satisfaction. Anita Walker has indicated that she wanted to continue with the  procedure. Attestation: I, the ordering provider, attest that I have discussed with the patient the benefits, risks, side-effects, alternatives, likelihood of achieving goals, and potential problems during recovery for the procedure that I have provided informed consent. Date  Time: 01/27/2020  9:24 AM  Pre-Procedure Preparation:  Monitoring: As per clinic protocol. Respiration, ETCO2, SpO2, BP, heart rate and rhythm monitor placed and checked for adequate function Safety Precautions: Patient was assessed for positional comfort and pressure points before starting the procedure. Time-out: I initiated and conducted the "Time-out" before starting the procedure, as per protocol. The patient was asked to participate by confirming the accuracy of the "Time Out" information. Verification of the correct person, site, and procedure were performed and confirmed by me, the nursing staff, and the patient. "Time-out" conducted as per Joint Commission's Universal Protocol (UP.01.01.01). Time: 1013  Description of Procedure:          Area Prepped: Lumbosacral DuraPrep (Iodine Povacrylex [0.7% available iodine] and Isopropyl Alcohol, 74% w/w) Safety Precautions: Aspiration looking for blood return was conducted prior to all injections. At no point did we inject any substances, as a needle was being advanced. No attempts were made at seeking any paresthesias. Safe injection practices and needle disposal techniques used. Medications properly checked for expiration dates. SDV (single dose vial) medications used. Description of the Procedure: Protocol guidelines were followed. The patient was placed in position. The target area was identified and prepped in the usual manner. Skin & deeper tissues infiltrated with local anesthetic. Appropriate time provided for local anesthetics to take effect. The procedure needle was slowly advanced to target area. Proper needle placement secured. Negative aspiration confirmed. Solution  injected in intermittent fashion, asking for systemic symptoms every 0.5cc. Needle(s) removed and area cleaned, making sure to leave some prepping solution back to take advantage of its long term bactericidal properties.  Vitals:   01/27/20 1023 01/27/20 1033 01/27/20 1043 01/27/20 1052  BP: (!) 163/85 (!) 157/72 (!) 145/73 (!) 141/67  Pulse:  Resp: 16 16 16 15   Temp:   (!) 97.2 F (36.2 C)   SpO2: 96% 99% 100% 99%  Weight:      Height:        Start Time: 1013 hrs. End Time: 1021 hrs. Materials:  Needle(s) Type: Epidural needle Gauge: 20G Length: 3.5-in Medication(s): Please see orders for medications and dosing details.  Imaging Guidance:          Type of Imaging Technique: None used Indication(s): N/A Exposure Time: No patient exposure Contrast: None used. Fluoroscopic Guidance: N/A Ultrasound Guidance: N/A Interpretation: N/A  Post-operative Assessment:  Post-procedure Vital Signs:  Pulse/HCG Rate: 7579 (nsr) Temp: (!) 97.2 F (36.2 C) Resp: 15 BP: (!) 141/67 SpO2: 99 %  EBL: None  Complications: No immediate post-treatment complications observed by team, or reported by patient.  Note: The patient tolerated the entire procedure well. A repeat set of vitals were taken after the procedure and the patient was kept under observation following institutional policy, for this type of procedure. Post-procedural neurological assessment was performed, showing return to baseline, prior to discharge. The patient was provided with post-procedure discharge instructions, including a section on how to identify potential problems. Should any problems arise concerning this procedure, the patient was given instructions to immediately contact us, at any time, without hesitation. In any case, we plan to contact the patient by telephone for a follow-up status report regarding this interventional procedure.  Comments:  No additional relevant information.  Plan of Care  Orders:   Orders Placed This Encounter  Procedures  . Injection tendon or ligament    Scheduling Instructions:     Type of Block:  Interspinous process ligament injection     Side: Midline     Sedation: With Sedation.     Timeframe: Today  . DG PAIN CLINIC C-ARM 1-60 MIN NO REPORT    Intraoperative interpretation by procedural physician at [place].    Standing Status:   Standing    Number of Occurrences:   1    Order Specific Question:   Reason for exam:    Answer:   Assistance in needle guidance and placement for procedures requiring needle placement in or near specific anatomical locations not easily accessible without such assistance.  . Informed Consent Details: Physician/Practitioner Attestation; Transcribe to consent form and obtain patient signature    Order Specific Question:   Physician/Practitioner attestation of informed consent for procedure/surgical case    Answer:   I, the physician/practitioner, attest that I have discussed with the patient the benefits, risks, side effects, alternatives, likelihood of achieving goals and potential problems during recovery for the procedure that I have provided informed consent.    Order Specific Question:   Procedure    Answer:   Interspinous ligament injection    Order Specific Question:   Physician/Practitioner performing the procedure    Answer:   Mickeal Daws A. Dossie Arbour, MD    Order Specific Question:   Indication/Reason    Answer:   Chronic midline low back pain  . Care order/instruction: Please confirm that the patient has stopped the Coumadin (Warfarin) X 5 days prior to procedure or surgery.    Please confirm that the patient has stopped the Coumadin (Warfarin) X 5 days prior to procedure or surgery.    Standing Status:   Standing    Number of Occurrences:   1  . Provide equipment / supplies at bedside    "Block Tray" (Disposable  single use) Needle type: SpinalSpinal Amount/quantity:  1 Size: Regular (3.5-inch) Gauge: 22G     Standing Status:   Standing    Number of Occurrences:   1    Order Specific Question:   Specify    Answer:   Block Tray  . Bleeding precautions    Standing Status:   Standing    Number of Occurrences:   1   Chronic Opioid Analgesic:  None.  Highest recorded MME/day: 25 mg/day MME/day: 0 mg/day   Medications ordered for procedure: Meds ordered this encounter  Medications  . lidocaine (XYLOCAINE) 2 % (with pres) injection 400 mg  . lactated ringers infusion 1,000 mL  . midazolam (VERSED) 5 MG/5ML injection 1-2 mg    Make sure Flumazenil is available in the pyxis when using this medication. If oversedation occurs, administer 0.2 mg IV over 15 sec. If after 45 sec no response, administer 0.2 mg again over 1 min; may repeat at 1 min intervals; not to exceed 4 doses (1 mg)  . fentaNYL (SUBLIMAZE) injection 25-50 mcg    Make sure Narcan is available in the pyxis when using this medication. In the event of respiratory depression (RR< 8/min): Titrate NARCAN (naloxone) in increments of 0.1 to 0.2 mg IV at 2-3 minute intervals, until desired degree of reversal.  . methylPREDNISolone acetate (DEPO-MEDROL) injection 80 mg  . ropivacaine (PF) 2 mg/mL (0.2%) (NAROPIN) injection 9 mL   Medications administered: We administered lidocaine, lactated ringers, midazolam, fentaNYL, methylPREDNISolone acetate, and ropivacaine (PF) 2 mg/mL (0.2%).  See the medical record for exact dosing, route, and time of administration.  Follow-up plan:   Return for (VIrtual), (PP) Follow-up.       Interventional treatment options: Planned, scheduled, and/or pending:   Diagnostic midline interspinous process injection of L3-4, L4-5, and L5-S1 #1    Under consideration:  Palliative interventions only.  (See below)   Considerations:  NOTE:  Fast-track referral COUMADIN ANTICOAGULATION (Stop: 5 days  Restart: 2 hrs)  NO MORE RFA - Poor intra-procedure compliance leading to suboptimal outcomes (<1 month  benefit) History of vasovagal with IV access.    Therapeutic/palliative (PRN):   Palliative right lumbar facet block #3  Palliative left lumbar facet block #4  Therapeutic right lumbar facet RFA (done 09/18/2019) (NO MORE RFA - Poor intra-op compliance)  Therapeutic left lumbar facet RFA (done 11/18/2019) (NO MORE RFA - Unreliable testing feedback)      Recent Visits Date Type Provider Dept  01/19/20 Telemedicine Milinda Pointer, MD [facility]  01/01/20 Procedure visit Milinda Pointer, MD [facility]  12/29/19 Telemedicine Milinda Pointer, MD [facility]  12/18/19 Telemedicine Milinda Pointer, [facility]  12/11/19 Office Visit Milinda Pointer, MD [facility]  11/18/19 Procedure visit Milinda Pointer, MD [facility]  Showing recent visits within past 90 days and meeting all other requirements Today's Visits Date Type Provider Dept  01/27/20 Procedure visit Milinda Pointer, MD [facility]  Showing today's visits and meeting all other requirements Future Appointments Date Type Provider Dept  02/11/20 Appointment Milinda Pointer, MD [facility]  Showing future appointments within next 90 days and meeting all other requirements  Disposition: Discharge home  Discharge (Date  Time): 01/27/2020; 1054 hrs.   Primary Care Physician: Einar Pheasant, MD Location: [facility] Note by: Gaspar Cola, MD Date: 01/27/2020; Time: 11:12 AM  Disclaimer:  Medicine is not an Chief Strategy Officer. The only guarantee in medicine is that nothing is guaranteed. It is important to  note that the decision to proceed with this intervention was based on the information collected from the patient. The Data and conclusions were drawn from the patient's questionnaire, the interview, and the physical examination. Because the information was provided in large part by the patient,  it cannot be guaranteed that it has not been purposely or unconsciously manipulated. Every effort has been made to obtain as much relevant data as possible for this evaluation. It is important to note that the conclusions that lead to this procedure are derived in large part from the available data. Always take into account that the treatment will also be dependent on availability of resources and existing treatment guidelines, considered by other Pain Management Practitioners as being common knowledge and practice, at the time of the intervention. For Medico-Legal purposes, it is also important to point out that variation in procedural techniques and pharmacological choices are the acceptable norm. The indications, contraindications, technique, and results of the above procedure should only be interpreted and judged by a Board-Certified Interventional Pain Specialist with extensive familiarity and expertise in the same exact procedure and technique.

## 2020-01-26 NOTE — Patient Instructions (Signed)

## 2020-01-27 ENCOUNTER — Ambulatory Visit (HOSPITAL_BASED_OUTPATIENT_CLINIC_OR_DEPARTMENT_OTHER): Payer: Medicare Other | Admitting: Pain Medicine

## 2020-01-27 ENCOUNTER — Ambulatory Visit
Admission: RE | Admit: 2020-01-27 | Discharge: 2020-01-27 | Disposition: A | Discharge status: Home / Self Care | Payer: Medicare Other | Source: Ambulatory Visit | Attending: Pain Medicine | Admitting: Pain Medicine

## 2020-01-27 ENCOUNTER — Encounter: Payer: Self-pay | Admitting: Pain Medicine

## 2020-01-27 VITALS — BP 141/67 | HR 75 | Temp 97.2°F | Resp 15 | Ht 69.0 in | Wt 240.0 lb

## 2020-01-27 DIAGNOSIS — M5137 Other intervertebral disc degeneration, lumbosacral region: Secondary | ICD-10-CM

## 2020-01-27 DIAGNOSIS — M4820 Kissing spine, site unspecified: Secondary | ICD-10-CM | POA: Insufficient documentation

## 2020-01-27 DIAGNOSIS — Z7901 Long term (current) use of anticoagulants: Secondary | ICD-10-CM | POA: Insufficient documentation

## 2020-01-27 DIAGNOSIS — G8929 Other chronic pain: Secondary | ICD-10-CM | POA: Diagnosis not present

## 2020-01-27 DIAGNOSIS — M4310 Spondylolisthesis, site unspecified: Secondary | ICD-10-CM | POA: Diagnosis not present

## 2020-01-27 DIAGNOSIS — M4609 Spinal enthesopathy, multiple sites in spine: Secondary | ICD-10-CM

## 2020-01-27 DIAGNOSIS — M545 Low back pain, unspecified: Secondary | ICD-10-CM | POA: Insufficient documentation

## 2020-01-27 MED ORDER — ROPIVACAINE HCL 2 MG/ML IJ SOLN
9.0000 mL | Freq: Once | INTRAMUSCULAR | Status: AC
  Administered 2020-01-27: 9 mL
  Filled 2020-01-27: qty 10

## 2020-01-27 MED ORDER — MIDAZOLAM HCL 5 MG/5ML IJ SOLN
1.0000 mg | INTRAMUSCULAR | Status: DC | PRN
  Administered 2020-01-27: 1 mg via INTRAVENOUS
  Filled 2020-01-27: qty 5

## 2020-01-27 MED ORDER — METHYLPREDNISOLONE ACETATE 80 MG/ML IJ SUSP
80.0000 mg | Freq: Once | INTRAMUSCULAR | Status: AC
  Administered 2020-01-27: 80 mg via INTRA_ARTICULAR
  Filled 2020-01-27: qty 1

## 2020-01-27 MED ORDER — LACTATED RINGERS IV SOLN
1000.0000 mL | Freq: Once | INTRAVENOUS | Status: AC
  Administered 2020-01-27: 1000 mL via INTRAVENOUS

## 2020-01-27 MED ORDER — FENTANYL CITRATE (PF) 100 MCG/2ML IJ SOLN
25.0000 ug | INTRAMUSCULAR | Status: DC | PRN
  Administered 2020-01-27: 50 ug via INTRAVENOUS
  Filled 2020-01-27: qty 2

## 2020-01-27 MED ORDER — LIDOCAINE HCL 2 % IJ SOLN
20.0000 mL | Freq: Once | INTRAMUSCULAR | Status: AC
  Administered 2020-01-27: 400 mg
  Filled 2020-01-27: qty 40

## 2020-01-27 NOTE — Progress Notes (Signed)
Safety precautions to be maintained throughout the outpatient stay will include: orient to surroundings, keep bed in low position, maintain call bell within reach at all times, provide assistance with transfer out of bed and ambulation.  

## 2020-01-28 ENCOUNTER — Telehealth: Payer: Self-pay

## 2020-01-28 NOTE — Telephone Encounter (Signed)
Post procedure phone call. Patient states she is doing good.  

## 2020-02-01 ENCOUNTER — Encounter: Payer: Self-pay | Admitting: Internal Medicine

## 2020-02-01 NOTE — Assessment & Plan Note (Signed)
Recent fall.  No syncope or near syncopal episodes.  Scraped her arm.  No head injury.  Discussed using her walker.  Discussed PT.  She declines.

## 2020-02-01 NOTE — Assessment & Plan Note (Signed)
On simvastatin.  Low cholesterol diet and exercise.  Follow lipid panel and liver function tests.   

## 2020-02-01 NOTE — Assessment & Plan Note (Signed)
Followed by dermatology

## 2020-02-01 NOTE — Assessment & Plan Note (Signed)
12/05/19 mammogram [facility] I.

## 2020-02-01 NOTE — Assessment & Plan Note (Signed)
Off coumadin with plans for back injection tomorrow.  Will restart coumadin tomorrow per pain clinic.

## 2020-02-01 NOTE — Assessment & Plan Note (Signed)
Blood pressure on my check today 130/78.  Continue amlodipine.  Follow pressures.  Follow metabolic panel.

## 2020-02-01 NOTE — Assessment & Plan Note (Signed)
Low carb diet and exercise.  Follow met b and a1c.  

## 2020-02-02 ENCOUNTER — Other Ambulatory Visit: Payer: Self-pay

## 2020-02-02 ENCOUNTER — Ambulatory Visit (INDEPENDENT_AMBULATORY_CARE_PROVIDER_SITE_OTHER): Payer: Medicare Other | Admitting: Podiatry

## 2020-02-02 ENCOUNTER — Encounter: Payer: Self-pay | Admitting: Podiatry

## 2020-02-02 DIAGNOSIS — D689 Coagulation defect, unspecified: Secondary | ICD-10-CM | POA: Diagnosis not present

## 2020-02-02 DIAGNOSIS — M79674 Pain in right toe(s): Secondary | ICD-10-CM | POA: Diagnosis not present

## 2020-02-02 DIAGNOSIS — M79675 Pain in left toe(s): Secondary | ICD-10-CM

## 2020-02-02 DIAGNOSIS — E1142 Type 2 diabetes mellitus with diabetic polyneuropathy: Secondary | ICD-10-CM

## 2020-02-02 DIAGNOSIS — B351 Tinea unguium: Secondary | ICD-10-CM | POA: Diagnosis not present

## 2020-02-02 NOTE — Progress Notes (Signed)
This patient returns to my office for at risk foot care.  This patient requires this care by a professional since this patient will be at risk due to having type 2 diabetes and coagulation defect   Patient is taking coumadin. This patient is unable to cut nails herself since the patient cannot reach her nails.These nails are painful walking and wearing shoes.  This patient presents for at risk foot care today.  General Appearance  Alert, conversant and in no acute stress.  Vascular  Dorsalis pedis and posterior tibial  pulses are palpable  bilaterally.  Capillary return is within normal limits  bilaterally. Temperature is within normal limits  bilaterally.  Neurologic  Senn-Weinstein monofilament wire test diminished   bilaterally. Muscle power within normal limits bilaterally.  Nails Thick disfigured discolored nails with subungual debris  from hallux to fifth toes bilaterally. No evidence of bacterial infection or drainage bilaterally.  Orthopedic  No limitations of motion  feet .  No crepitus or effusions noted.  No bony pathology or digital deformities noted.  Skin  normotropic skin with no porokeratosis noted bilaterally.  No signs of infections or ulcers noted.     Onychomycosis  Pain in right toes  Pain in left toes  Consent was obtained for treatment procedures.   Mechanical debridement of nails 1-5  bilaterally performed with a nail nipper.  Filed with dremel without incident.    Return office visit    10 weeks                  Told patient to return for periodic foot care and evaluation due to potential at risk complications.   Mikeisha Lemonds DPM  

## 2020-02-04 NOTE — Progress Notes (Signed)
PTNS  Session #monthly maintenance  Health & Social Factors: No change Caffeine: 0 Alcohol: 0 Daytime voids #per day: 5-10 Night-time voids #per night: 2 Urgency: Mild Incontinence Episodes #per day: 0 Ankle used: Right Treatment Setting: 19 Feeling/ Response: Sensory Comments: Patient tolerated procedure well  Performed By: Zara Council, PA-C and Verlene Mayer, CMA  Follow Up: One month for PTNS

## 2020-02-05 ENCOUNTER — Encounter: Payer: Self-pay | Admitting: Urology

## 2020-02-05 ENCOUNTER — Ambulatory Visit: Payer: Self-pay | Admitting: Urology

## 2020-02-05 ENCOUNTER — Telehealth: Payer: Self-pay | Admitting: *Deleted

## 2020-02-05 ENCOUNTER — Ambulatory Visit (INDEPENDENT_AMBULATORY_CARE_PROVIDER_SITE_OTHER): Payer: Medicare Other | Admitting: Urology

## 2020-02-05 ENCOUNTER — Other Ambulatory Visit (INDEPENDENT_AMBULATORY_CARE_PROVIDER_SITE_OTHER): Payer: Medicare Other

## 2020-02-05 ENCOUNTER — Other Ambulatory Visit: Payer: Self-pay

## 2020-02-05 ENCOUNTER — Other Ambulatory Visit: Payer: Self-pay | Admitting: Internal Medicine

## 2020-02-05 VITALS — BP 138/85 | HR 98

## 2020-02-05 DIAGNOSIS — Z7901 Long term (current) use of anticoagulants: Secondary | ICD-10-CM | POA: Diagnosis not present

## 2020-02-05 DIAGNOSIS — N3941 Urge incontinence: Secondary | ICD-10-CM | POA: Diagnosis not present

## 2020-02-05 DIAGNOSIS — R2681 Unsteadiness on feet: Secondary | ICD-10-CM

## 2020-02-05 DIAGNOSIS — W19XXXA Unspecified fall, initial encounter: Secondary | ICD-10-CM

## 2020-02-05 LAB — PROTIME-INR
INR: 1.9 ratio — ABNORMAL HIGH (ref 0.8–1.0)
Prothrombin Time: 21.1 s — ABNORMAL HIGH (ref 9.6–13.1)

## 2020-02-05 NOTE — Assessment & Plan Note (Signed)
Recent fall.  At high risk.  Discussed using a cane/walker.  Discussed PT.

## 2020-02-05 NOTE — Progress Notes (Signed)
Order placed for referral to [facility] PT

## 2020-02-05 NOTE — Telephone Encounter (Signed)
Pt came in for labs this morning & asked me to let you know that she has decided to proceed with referral for feet & leg therapy. She is now using her walking more frequently also.   She does not want to go to the place she was sent to last. She could not remember the name of the company.

## 2020-02-06 LAB — URINALYSIS, COMPLETE
Bilirubin, UA: NEGATIVE
Glucose, UA: NEGATIVE
Ketones, UA: NEGATIVE
Nitrite, UA: NEGATIVE
Protein,UA: NEGATIVE
Specific Gravity, UA: 1.015 (ref 1.005–1.030)
Urobilinogen, Ur: 0.2 mg/dL (ref 0.2–1.0)
pH, UA: 6.5 (ref 5.0–7.5)

## 2020-02-06 LAB — MICROSCOPIC EXAMINATION: WBC, UA: >30 /hpf — AB (ref 0–5)

## 2020-02-09 DIAGNOSIS — M545 Low back pain, unspecified: Secondary | ICD-10-CM | POA: Diagnosis not present

## 2020-02-09 DIAGNOSIS — Z79899 Other long term (current) drug therapy: Secondary | ICD-10-CM | POA: Diagnosis not present

## 2020-02-09 DIAGNOSIS — Z7901 Long term (current) use of anticoagulants: Secondary | ICD-10-CM | POA: Diagnosis not present

## 2020-02-09 DIAGNOSIS — I4949 Other premature depolarization: Secondary | ICD-10-CM | POA: Diagnosis not present

## 2020-02-09 DIAGNOSIS — R102 Pelvic and perineal pain: Secondary | ICD-10-CM | POA: Diagnosis not present

## 2020-02-09 DIAGNOSIS — Z880 Allergy status to penicillin: Secondary | ICD-10-CM | POA: Diagnosis not present

## 2020-02-09 DIAGNOSIS — W19XXXA Unspecified fall, initial encounter: Secondary | ICD-10-CM | POA: Diagnosis not present

## 2020-02-09 DIAGNOSIS — Z853 Personal history of malignant neoplasm of breast: Secondary | ICD-10-CM | POA: Diagnosis not present

## 2020-02-09 DIAGNOSIS — Z86718 Personal history of other venous thrombosis and embolism: Secondary | ICD-10-CM | POA: Diagnosis not present

## 2020-02-09 DIAGNOSIS — G894 Chronic pain syndrome: Secondary | ICD-10-CM | POA: Diagnosis not present

## 2020-02-09 DIAGNOSIS — E785 Hyperlipidemia, unspecified: Secondary | ICD-10-CM | POA: Diagnosis not present

## 2020-02-09 DIAGNOSIS — Z832 Family history of diseases of the blood and blood-forming organs and certain disorders involving the immune mechanism: Secondary | ICD-10-CM | POA: Diagnosis not present

## 2020-02-09 DIAGNOSIS — Z20822 Contact with and (suspected) exposure to covid-19: Secondary | ICD-10-CM | POA: Diagnosis not present

## 2020-02-09 DIAGNOSIS — M160 Bilateral primary osteoarthritis of hip: Secondary | ICD-10-CM | POA: Diagnosis not present

## 2020-02-09 DIAGNOSIS — Z6835 Body mass index (BMI) 35.0-35.9, adult: Secondary | ICD-10-CM | POA: Diagnosis not present

## 2020-02-09 DIAGNOSIS — E119 Type 2 diabetes mellitus without complications: Secondary | ICD-10-CM | POA: Diagnosis not present

## 2020-02-09 DIAGNOSIS — I10 Essential (primary) hypertension: Secondary | ICD-10-CM | POA: Diagnosis not present

## 2020-02-09 DIAGNOSIS — S32030A Wedge compression fracture of third lumbar vertebra, initial encounter for closed fracture: Secondary | ICD-10-CM | POA: Diagnosis not present

## 2020-02-09 DIAGNOSIS — S064X0A Epidural hemorrhage without loss of consciousness, initial encounter: Secondary | ICD-10-CM | POA: Diagnosis not present

## 2020-02-09 DIAGNOSIS — S3992XA Unspecified injury of lower back, initial encounter: Secondary | ICD-10-CM | POA: Diagnosis not present

## 2020-02-09 DIAGNOSIS — M47816 Spondylosis without myelopathy or radiculopathy, lumbar region: Secondary | ICD-10-CM | POA: Diagnosis not present

## 2020-02-09 DIAGNOSIS — M1712 Unilateral primary osteoarthritis, left knee: Secondary | ICD-10-CM | POA: Diagnosis not present

## 2020-02-09 DIAGNOSIS — E7849 Other hyperlipidemia: Secondary | ICD-10-CM | POA: Diagnosis not present

## 2020-02-09 DIAGNOSIS — G8929 Other chronic pain: Secondary | ICD-10-CM | POA: Diagnosis not present

## 2020-02-09 DIAGNOSIS — M4856XA Collapsed vertebra, not elsewhere classified, lumbar region, initial encounter for fracture: Secondary | ICD-10-CM | POA: Diagnosis not present

## 2020-02-09 DIAGNOSIS — K219 Gastro-esophageal reflux disease without esophagitis: Secondary | ICD-10-CM | POA: Diagnosis not present

## 2020-02-09 DIAGNOSIS — Z043 Encounter for examination and observation following other accident: Secondary | ICD-10-CM | POA: Diagnosis not present

## 2020-02-09 DIAGNOSIS — M48061 Spinal stenosis, lumbar region without neurogenic claudication: Secondary | ICD-10-CM | POA: Diagnosis not present

## 2020-02-09 DIAGNOSIS — M25552 Pain in left hip: Secondary | ICD-10-CM | POA: Diagnosis not present

## 2020-02-10 DIAGNOSIS — I4949 Other premature depolarization: Secondary | ICD-10-CM | POA: Diagnosis not present

## 2020-02-10 DIAGNOSIS — W19XXXA Unspecified fall, initial encounter: Secondary | ICD-10-CM | POA: Diagnosis not present

## 2020-02-11 ENCOUNTER — Telehealth: Payer: Medicare Other | Admitting: Pain Medicine

## 2020-02-11 ENCOUNTER — Encounter: Payer: Self-pay | Admitting: Internal Medicine

## 2020-02-11 DIAGNOSIS — S064X0A Epidural hemorrhage without loss of consciousness, initial encounter: Secondary | ICD-10-CM | POA: Diagnosis not present

## 2020-02-11 DIAGNOSIS — I10 Essential (primary) hypertension: Secondary | ICD-10-CM | POA: Diagnosis not present

## 2020-02-11 DIAGNOSIS — S32030A Wedge compression fracture of third lumbar vertebra, initial encounter for closed fracture: Secondary | ICD-10-CM | POA: Diagnosis not present

## 2020-02-11 DIAGNOSIS — Z86718 Personal history of other venous thrombosis and embolism: Secondary | ICD-10-CM | POA: Diagnosis not present

## 2020-02-11 DIAGNOSIS — E7849 Other hyperlipidemia: Secondary | ICD-10-CM | POA: Diagnosis not present

## 2020-02-11 DIAGNOSIS — M47816 Spondylosis without myelopathy or radiculopathy, lumbar region: Secondary | ICD-10-CM | POA: Diagnosis not present

## 2020-02-11 DIAGNOSIS — Z043 Encounter for examination and observation following other accident: Secondary | ICD-10-CM | POA: Diagnosis not present

## 2020-02-11 DIAGNOSIS — M48061 Spinal stenosis, lumbar region without neurogenic claudication: Secondary | ICD-10-CM | POA: Diagnosis not present

## 2020-02-11 NOTE — Telephone Encounter (Signed)
See my chart message. Pt admitted at [facility].  Daughter messaged to hold on PT.

## 2020-02-12 DIAGNOSIS — G8929 Other chronic pain: Secondary | ICD-10-CM | POA: Diagnosis not present

## 2020-02-12 DIAGNOSIS — I10 Essential (primary) hypertension: Secondary | ICD-10-CM | POA: Diagnosis not present

## 2020-02-12 DIAGNOSIS — Z7901 Long term (current) use of anticoagulants: Secondary | ICD-10-CM | POA: Diagnosis not present

## 2020-02-12 DIAGNOSIS — M545 Low back pain, unspecified: Secondary | ICD-10-CM | POA: Diagnosis not present

## 2020-02-12 DIAGNOSIS — Z6835 Body mass index (BMI) 35.0-35.9, adult: Secondary | ICD-10-CM | POA: Diagnosis not present

## 2020-02-12 DIAGNOSIS — E785 Hyperlipidemia, unspecified: Secondary | ICD-10-CM | POA: Diagnosis not present

## 2020-02-13 DIAGNOSIS — G8929 Other chronic pain: Secondary | ICD-10-CM | POA: Diagnosis not present

## 2020-02-13 DIAGNOSIS — I10 Essential (primary) hypertension: Secondary | ICD-10-CM | POA: Diagnosis not present

## 2020-02-13 DIAGNOSIS — M545 Low back pain, unspecified: Secondary | ICD-10-CM | POA: Diagnosis not present

## 2020-02-13 DIAGNOSIS — Z7901 Long term (current) use of anticoagulants: Secondary | ICD-10-CM | POA: Diagnosis not present

## 2020-02-13 DIAGNOSIS — Z6835 Body mass index (BMI) 35.0-35.9, adult: Secondary | ICD-10-CM | POA: Diagnosis not present

## 2020-02-13 DIAGNOSIS — E785 Hyperlipidemia, unspecified: Secondary | ICD-10-CM | POA: Diagnosis not present

## 2020-02-14 DIAGNOSIS — Z7901 Long term (current) use of anticoagulants: Secondary | ICD-10-CM | POA: Diagnosis not present

## 2020-02-14 DIAGNOSIS — G8929 Other chronic pain: Secondary | ICD-10-CM | POA: Diagnosis not present

## 2020-02-14 DIAGNOSIS — I10 Essential (primary) hypertension: Secondary | ICD-10-CM | POA: Diagnosis not present

## 2020-02-14 DIAGNOSIS — Z6835 Body mass index (BMI) 35.0-35.9, adult: Secondary | ICD-10-CM | POA: Diagnosis not present

## 2020-02-14 DIAGNOSIS — E785 Hyperlipidemia, unspecified: Secondary | ICD-10-CM | POA: Diagnosis not present

## 2020-02-14 DIAGNOSIS — M545 Low back pain, unspecified: Secondary | ICD-10-CM | POA: Diagnosis not present

## 2020-02-15 DIAGNOSIS — Z6835 Body mass index (BMI) 35.0-35.9, adult: Secondary | ICD-10-CM | POA: Diagnosis not present

## 2020-02-15 DIAGNOSIS — M545 Low back pain, unspecified: Secondary | ICD-10-CM | POA: Diagnosis not present

## 2020-02-15 DIAGNOSIS — E785 Hyperlipidemia, unspecified: Secondary | ICD-10-CM | POA: Diagnosis not present

## 2020-02-15 DIAGNOSIS — I10 Essential (primary) hypertension: Secondary | ICD-10-CM | POA: Diagnosis not present

## 2020-02-15 DIAGNOSIS — G8929 Other chronic pain: Secondary | ICD-10-CM | POA: Diagnosis not present

## 2020-02-15 DIAGNOSIS — Z7901 Long term (current) use of anticoagulants: Secondary | ICD-10-CM | POA: Diagnosis not present

## 2020-02-16 ENCOUNTER — Other Ambulatory Visit: Payer: Medicare Other

## 2020-02-16 DIAGNOSIS — Z7901 Long term (current) use of anticoagulants: Secondary | ICD-10-CM | POA: Diagnosis not present

## 2020-02-16 DIAGNOSIS — E7800 Pure hypercholesterolemia, unspecified: Secondary | ICD-10-CM | POA: Diagnosis not present

## 2020-02-16 DIAGNOSIS — Z6835 Body mass index (BMI) 35.0-35.9, adult: Secondary | ICD-10-CM | POA: Diagnosis not present

## 2020-02-16 DIAGNOSIS — M4846XA Fatigue fracture of vertebra, lumbar region, initial encounter for fracture: Secondary | ICD-10-CM | POA: Diagnosis not present

## 2020-02-16 DIAGNOSIS — K219 Gastro-esophageal reflux disease without esophagitis: Secondary | ICD-10-CM | POA: Diagnosis not present

## 2020-02-16 DIAGNOSIS — S0990XA Unspecified injury of head, initial encounter: Secondary | ICD-10-CM | POA: Diagnosis not present

## 2020-02-16 DIAGNOSIS — E119 Type 2 diabetes mellitus without complications: Secondary | ICD-10-CM | POA: Diagnosis not present

## 2020-02-16 DIAGNOSIS — R059 Cough, unspecified: Secondary | ICD-10-CM | POA: Diagnosis not present

## 2020-02-16 DIAGNOSIS — E1042 Type 1 diabetes mellitus with diabetic polyneuropathy: Secondary | ICD-10-CM | POA: Diagnosis not present

## 2020-02-16 DIAGNOSIS — S22000A Wedge compression fracture of unspecified thoracic vertebra, initial encounter for closed fracture: Secondary | ICD-10-CM | POA: Diagnosis not present

## 2020-02-16 DIAGNOSIS — J3089 Other allergic rhinitis: Secondary | ICD-10-CM | POA: Diagnosis not present

## 2020-02-16 DIAGNOSIS — K5900 Constipation, unspecified: Secondary | ICD-10-CM | POA: Diagnosis not present

## 2020-02-16 DIAGNOSIS — B354 Tinea corporis: Secondary | ICD-10-CM | POA: Diagnosis not present

## 2020-02-16 DIAGNOSIS — M25511 Pain in right shoulder: Secondary | ICD-10-CM | POA: Diagnosis not present

## 2020-02-16 DIAGNOSIS — Y92091 Bathroom in other non-institutional residence as the place of occurrence of the external cause: Secondary | ICD-10-CM | POA: Diagnosis not present

## 2020-02-16 DIAGNOSIS — M545 Low back pain, unspecified: Secondary | ICD-10-CM | POA: Diagnosis not present

## 2020-02-16 DIAGNOSIS — Y9301 Activity, walking, marching and hiking: Secondary | ICD-10-CM | POA: Diagnosis not present

## 2020-02-16 DIAGNOSIS — Z853 Personal history of malignant neoplasm of breast: Secondary | ICD-10-CM | POA: Diagnosis not present

## 2020-02-16 DIAGNOSIS — E785 Hyperlipidemia, unspecified: Secondary | ICD-10-CM | POA: Diagnosis not present

## 2020-02-16 DIAGNOSIS — Z043 Encounter for examination and observation following other accident: Secondary | ICD-10-CM | POA: Diagnosis not present

## 2020-02-16 DIAGNOSIS — G894 Chronic pain syndrome: Secondary | ICD-10-CM | POA: Diagnosis not present

## 2020-02-16 DIAGNOSIS — M6281 Muscle weakness (generalized): Secondary | ICD-10-CM | POA: Diagnosis not present

## 2020-02-16 DIAGNOSIS — M160 Bilateral primary osteoarthritis of hip: Secondary | ICD-10-CM | POA: Diagnosis not present

## 2020-02-16 DIAGNOSIS — Z7189 Other specified counseling: Secondary | ICD-10-CM | POA: Diagnosis not present

## 2020-02-16 DIAGNOSIS — W01198A Fall on same level from slipping, tripping and stumbling with subsequent striking against other object, initial encounter: Secondary | ICD-10-CM | POA: Diagnosis not present

## 2020-02-16 DIAGNOSIS — E7849 Other hyperlipidemia: Secondary | ICD-10-CM | POA: Diagnosis not present

## 2020-02-16 DIAGNOSIS — Z79899 Other long term (current) drug therapy: Secondary | ICD-10-CM | POA: Diagnosis not present

## 2020-02-16 DIAGNOSIS — E1169 Type 2 diabetes mellitus with other specified complication: Secondary | ICD-10-CM | POA: Diagnosis not present

## 2020-02-16 DIAGNOSIS — R279 Unspecified lack of coordination: Secondary | ICD-10-CM | POA: Diagnosis not present

## 2020-02-16 DIAGNOSIS — R2689 Other abnormalities of gait and mobility: Secondary | ICD-10-CM | POA: Diagnosis not present

## 2020-02-16 DIAGNOSIS — M1712 Unilateral primary osteoarthritis, left knee: Secondary | ICD-10-CM | POA: Diagnosis not present

## 2020-02-16 DIAGNOSIS — R058 Other specified cough: Secondary | ICD-10-CM | POA: Diagnosis not present

## 2020-02-16 DIAGNOSIS — S32038D Other fracture of third lumbar vertebra, subsequent encounter for fracture with routine healing: Secondary | ICD-10-CM | POA: Diagnosis not present

## 2020-02-16 DIAGNOSIS — I10 Essential (primary) hypertension: Secondary | ICD-10-CM | POA: Diagnosis not present

## 2020-02-16 DIAGNOSIS — R5381 Other malaise: Secondary | ICD-10-CM | POA: Diagnosis not present

## 2020-02-17 DIAGNOSIS — E1042 Type 1 diabetes mellitus with diabetic polyneuropathy: Secondary | ICD-10-CM | POA: Diagnosis not present

## 2020-02-17 DIAGNOSIS — S22000A Wedge compression fracture of unspecified thoracic vertebra, initial encounter for closed fracture: Secondary | ICD-10-CM | POA: Diagnosis not present

## 2020-02-17 DIAGNOSIS — M545 Low back pain, unspecified: Secondary | ICD-10-CM | POA: Diagnosis not present

## 2020-02-17 DIAGNOSIS — R5381 Other malaise: Secondary | ICD-10-CM | POA: Diagnosis not present

## 2020-02-17 NOTE — Telephone Encounter (Signed)
Please call pts daughter and let her know that she needs to speak with MD or nurse in charge and let them know she needs her PT/INR checked.  But in reviewing, it appears INR check yesterday.

## 2020-02-17 NOTE — Telephone Encounter (Signed)
Patients daughter is aware and is going to call facility

## 2020-02-20 DIAGNOSIS — R5381 Other malaise: Secondary | ICD-10-CM | POA: Diagnosis not present

## 2020-02-20 DIAGNOSIS — M4846XA Fatigue fracture of vertebra, lumbar region, initial encounter for fracture: Secondary | ICD-10-CM | POA: Diagnosis not present

## 2020-02-20 DIAGNOSIS — K5900 Constipation, unspecified: Secondary | ICD-10-CM | POA: Diagnosis not present

## 2020-02-20 DIAGNOSIS — Z7189 Other specified counseling: Secondary | ICD-10-CM | POA: Diagnosis not present

## 2020-02-24 DIAGNOSIS — J3089 Other allergic rhinitis: Secondary | ICD-10-CM | POA: Diagnosis not present

## 2020-03-01 ENCOUNTER — Other Ambulatory Visit: Payer: Self-pay | Admitting: Internal Medicine

## 2020-03-03 DIAGNOSIS — R059 Cough, unspecified: Secondary | ICD-10-CM | POA: Diagnosis not present

## 2020-03-03 DIAGNOSIS — R5381 Other malaise: Secondary | ICD-10-CM | POA: Diagnosis not present

## 2020-03-03 DIAGNOSIS — G894 Chronic pain syndrome: Secondary | ICD-10-CM | POA: Diagnosis not present

## 2020-03-03 DIAGNOSIS — R058 Other specified cough: Secondary | ICD-10-CM | POA: Diagnosis not present

## 2020-03-06 DIAGNOSIS — R059 Cough, unspecified: Secondary | ICD-10-CM | POA: Diagnosis not present

## 2020-03-06 DIAGNOSIS — R058 Other specified cough: Secondary | ICD-10-CM | POA: Diagnosis not present

## 2020-03-06 DIAGNOSIS — I10 Essential (primary) hypertension: Secondary | ICD-10-CM | POA: Diagnosis not present

## 2020-03-06 DIAGNOSIS — S32038D Other fracture of third lumbar vertebra, subsequent encounter for fracture with routine healing: Secondary | ICD-10-CM | POA: Diagnosis not present

## 2020-03-06 DIAGNOSIS — Z79899 Other long term (current) drug therapy: Secondary | ICD-10-CM | POA: Diagnosis not present

## 2020-03-06 DIAGNOSIS — Z7901 Long term (current) use of anticoagulants: Secondary | ICD-10-CM | POA: Diagnosis not present

## 2020-03-06 DIAGNOSIS — S0990XA Unspecified injury of head, initial encounter: Secondary | ICD-10-CM | POA: Diagnosis not present

## 2020-03-06 DIAGNOSIS — Z853 Personal history of malignant neoplasm of breast: Secondary | ICD-10-CM | POA: Diagnosis not present

## 2020-03-06 DIAGNOSIS — Y92091 Bathroom in other non-institutional residence as the place of occurrence of the external cause: Secondary | ICD-10-CM | POA: Diagnosis not present

## 2020-03-06 DIAGNOSIS — B354 Tinea corporis: Secondary | ICD-10-CM | POA: Diagnosis not present

## 2020-03-06 DIAGNOSIS — R2689 Other abnormalities of gait and mobility: Secondary | ICD-10-CM | POA: Diagnosis not present

## 2020-03-06 DIAGNOSIS — M25511 Pain in right shoulder: Secondary | ICD-10-CM | POA: Diagnosis present

## 2020-03-06 DIAGNOSIS — W01198A Fall on same level from slipping, tripping and stumbling with subsequent striking against other object, initial encounter: Secondary | ICD-10-CM | POA: Diagnosis not present

## 2020-03-06 DIAGNOSIS — E1169 Type 2 diabetes mellitus with other specified complication: Secondary | ICD-10-CM | POA: Diagnosis not present

## 2020-03-06 DIAGNOSIS — E785 Hyperlipidemia, unspecified: Secondary | ICD-10-CM | POA: Diagnosis not present

## 2020-03-06 DIAGNOSIS — E7800 Pure hypercholesterolemia, unspecified: Secondary | ICD-10-CM | POA: Diagnosis not present

## 2020-03-06 DIAGNOSIS — M6281 Muscle weakness (generalized): Secondary | ICD-10-CM | POA: Diagnosis not present

## 2020-03-06 DIAGNOSIS — Y9301 Activity, walking, marching and hiking: Secondary | ICD-10-CM | POA: Diagnosis not present

## 2020-03-06 DIAGNOSIS — Z043 Encounter for examination and observation following other accident: Secondary | ICD-10-CM | POA: Diagnosis not present

## 2020-03-06 DIAGNOSIS — R279 Unspecified lack of coordination: Secondary | ICD-10-CM | POA: Diagnosis not present

## 2020-03-06 DIAGNOSIS — R5381 Other malaise: Secondary | ICD-10-CM | POA: Diagnosis not present

## 2020-03-06 DIAGNOSIS — G894 Chronic pain syndrome: Secondary | ICD-10-CM | POA: Diagnosis not present

## 2020-03-09 ENCOUNTER — Ambulatory Visit: Payer: Self-pay | Admitting: Urology

## 2020-03-11 DIAGNOSIS — R058 Other specified cough: Secondary | ICD-10-CM | POA: Diagnosis not present

## 2020-03-12 DIAGNOSIS — I10 Essential (primary) hypertension: Secondary | ICD-10-CM | POA: Diagnosis not present

## 2020-03-12 DIAGNOSIS — E785 Hyperlipidemia, unspecified: Secondary | ICD-10-CM | POA: Diagnosis not present

## 2020-03-12 DIAGNOSIS — R5381 Other malaise: Secondary | ICD-10-CM | POA: Diagnosis not present

## 2020-03-12 DIAGNOSIS — G894 Chronic pain syndrome: Secondary | ICD-10-CM | POA: Diagnosis not present

## 2020-03-16 DIAGNOSIS — R5381 Other malaise: Secondary | ICD-10-CM | POA: Diagnosis not present

## 2020-03-16 DIAGNOSIS — B354 Tinea corporis: Secondary | ICD-10-CM | POA: Diagnosis not present

## 2020-03-17 DIAGNOSIS — G894 Chronic pain syndrome: Secondary | ICD-10-CM | POA: Diagnosis not present

## 2020-03-17 DIAGNOSIS — R5381 Other malaise: Secondary | ICD-10-CM | POA: Diagnosis not present

## 2020-03-17 DIAGNOSIS — Z7901 Long term (current) use of anticoagulants: Secondary | ICD-10-CM | POA: Diagnosis not present

## 2020-03-19 ENCOUNTER — Other Ambulatory Visit: Payer: Self-pay

## 2020-03-19 ENCOUNTER — Emergency Department: Payer: Medicare Other

## 2020-03-19 ENCOUNTER — Emergency Department
Admission: EM | Admit: 2020-03-19 | Discharge: 2020-03-20 | Disposition: A | Discharge status: Home / Self Care | Payer: Medicare Other | Attending: Student in an Organized Health Care Education/Training Program | Admitting: Student in an Organized Health Care Education/Training Program

## 2020-03-19 DIAGNOSIS — W01198A Fall on same level from slipping, tripping and stumbling with subsequent striking against other object, initial encounter: Secondary | ICD-10-CM | POA: Insufficient documentation

## 2020-03-19 DIAGNOSIS — Z79899 Other long term (current) drug therapy: Secondary | ICD-10-CM | POA: Diagnosis not present

## 2020-03-19 DIAGNOSIS — S0990XA Unspecified injury of head, initial encounter: Secondary | ICD-10-CM | POA: Diagnosis not present

## 2020-03-19 DIAGNOSIS — I10 Essential (primary) hypertension: Secondary | ICD-10-CM | POA: Diagnosis not present

## 2020-03-19 DIAGNOSIS — Z7901 Long term (current) use of anticoagulants: Secondary | ICD-10-CM | POA: Diagnosis not present

## 2020-03-19 DIAGNOSIS — Y92091 Bathroom in other non-institutional residence as the place of occurrence of the external cause: Secondary | ICD-10-CM | POA: Insufficient documentation

## 2020-03-19 DIAGNOSIS — M25511 Pain in right shoulder: Secondary | ICD-10-CM

## 2020-03-19 DIAGNOSIS — Z853 Personal history of malignant neoplasm of breast: Secondary | ICD-10-CM | POA: Diagnosis not present

## 2020-03-19 DIAGNOSIS — E1169 Type 2 diabetes mellitus with other specified complication: Secondary | ICD-10-CM | POA: Insufficient documentation

## 2020-03-19 DIAGNOSIS — W19XXXA Unspecified fall, initial encounter: Secondary | ICD-10-CM

## 2020-03-19 DIAGNOSIS — E7800 Pure hypercholesterolemia, unspecified: Secondary | ICD-10-CM | POA: Insufficient documentation

## 2020-03-19 DIAGNOSIS — Y9301 Activity, walking, marching and hiking: Secondary | ICD-10-CM | POA: Diagnosis not present

## 2020-03-19 DIAGNOSIS — Z043 Encounter for examination and observation following other accident: Secondary | ICD-10-CM | POA: Diagnosis not present

## 2020-03-19 NOTE — ED Notes (Signed)
Secure chat message sent to dr. Ellender Hose regarding injury and orders

## 2020-03-19 NOTE — ED Triage Notes (Signed)
Pt states she went up steps with her walker and she fell on floor. Pt states she was standing and landed on her right shoulder. Pt unsure if she hit her head, denies loc. Pt denies neck pain.

## 2020-03-20 ENCOUNTER — Encounter: Payer: Self-pay | Admitting: Internal Medicine

## 2020-03-20 DIAGNOSIS — G319 Degenerative disease of nervous system, unspecified: Secondary | ICD-10-CM | POA: Insufficient documentation

## 2020-03-20 NOTE — ED Provider Notes (Signed)
[facility] Emergency Department Provider Note    Event Date/Time   First MD Initiated Contact with Patient 03/20/20 [phone]     (approximate)  I have reviewed the triage vital signs and the nursing notes.   HISTORY  Chief Complaint Fall    HPI Anita Walker is a 80 y.o. female below listed past medical history presents to the ER for evaluation of right shoulder pain after mechanical fall.  States that she was walking at her sister's house went to go get on the porch and tripped falling landing on her right side.  Says that she may have struck her head denies any LOC.  No neck pain.  Denies any chest pain or shortness of breath.  No abdominal pain.  No hip pain or leg pain.  Past Medical History:  Diagnosis Date  . Anemia   . Arthritis   . Atrial fibrillation ([place])   . Breast cancer ([place])    s/p lumpectomy 1992.  s/p chemo and xrt left breast  . Diabetes mellitus ([place])   . Edema    feet/legs  . Gastric ulcer   . GERD (gastroesophageal reflux disease)   . HOH (hard of hearing)    aides  . Hypercholesterolemia   . Hypertension   . Personal history of chemotherapy   . Pulmonary emboli (HCC)    Family History  Problem Relation Age of Onset  . Heart disease Father   . Heart disease Brother        s/p CABG  . Breast cancer Daughter   . Colon cancer Neg Hx    Past Surgical History:  Procedure Laterality Date  . BREAST LUMPECTOMY  03/06/1990   left breast  . CATARACT EXTRACTION W/PHACO Right 12/26/2016   Procedure: CATARACT EXTRACTION PHACO AND INTRAOCULAR LENS PLACEMENT (IOC)-RIGHT DIABETIC;  Surgeon: Birder Robson, MD;  Location: [facility] ORS;  Service: Ophthalmology;  Laterality: Right;  [place] 00:47 AP% 24.5 CDE 11.62 Fluid pack lot # [id number]  . CATARACT EXTRACTION W/PHACO Left 01/23/2017   Procedure: CATARACT EXTRACTION PHACO AND INTRAOCULAR LENS PLACEMENT (IOC);  Surgeon: Birder Robson, MD;  Location: [facility] ORS;  Service: Ophthalmology;   Laterality: Left;  [place] 00:50 AP% 16.1 CDE 8.18 Fluid pack lot [id number]  . COLONOSCOPY WITH PROPOFOL N/A 07/08/2019   Procedure: COLONOSCOPY WITH PROPOFOL;  Surgeon: Lucilla Lame, MD;  Location: [facility] ENDOSCOPY;  Service: Endoscopy;  Laterality: N/A;   Patient Active Problem List   Diagnosis Date Noted  . Chronic low back pain ([place]) w/o sciatica 01/26/2020  . 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 Grade 1 Retrolisthesis of L3/L4 12/18/2019  . Lip lesion 12/14/2019  . Acute exacerbation of chronic low back pain 12/11/2019  . At high risk for falls 12/11/2019  . Fall at home, sequela 12/11/2019  . Head injury 12/02/2019  . Bradycardia following surgery 11/18/2019  . Wrist fracture 11/02/2019  . Weakness 11/02/2019  . Change in bowel movement 11/02/2019  . DDD (degenerative disc disease), lumbosacral 09/18/2019  . Irritability and anger 09/18/2019  . Nausea without vomiting 09/18/2019  . History of vasovagal symptom 08/14/2019    Class: History of  . Polyp of descending colon   . Positive colorectal cancer screening using Cologuard test 05/25/2019  . Spondylosis without myelopathy or radiculopathy, lumbosacral region 03/11/2019  . 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  Compression fracture of thoracolumbar vertebra (T11), sequela 02/18/2019  . Lumbar facet arthropathy (Multilevel) 02/18/2019  . 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 L5-S1 disc-osteophyte complex (Right) 02/18/2019  . Lumbar facet syndrome (Bilateral) (R>L) 02/18/2019  . Chronic anticoagulation (Coumadin) 02/18/2019  . Chronic low back pain (Bilateral) (R>L) w/o sciatica 02/18/2019  . Chronic pain syndrome 02/18/2019  . Compression fracture of thoracic vertebra ([place]) 10/12/2018  . Pain due to onychomycosis of toenails  of both feet 09/02/2018  . Coagulation disorder ([place]) 09/02/2018  . Constipation 08/18/2018  . Fall 08/18/2018  . Diabetic polyneuropathy associated with type 2 diabetes mellitus ([place]) 03/18/2017  . Abnormal chest CT 03/18/2017  . Urinary frequency 01/21/2017  . Syncope 11/10/2016  . Right hip pain 10/30/2016  . Cough 10/25/2016  . Health care maintenance 05/23/2014  . Mixed emotional features as adjustment reaction 07/20/2013  . Long term current use of anticoagulant therapy 04/11/2013  . Hypertension 01/01/2012  . Hypercholesteremia 01/01/2012  . History of breast cancer 01/01/2012  . Essential hypertension 01/01/2012  . Pure hypercholesterolemia 01/01/2012  . Type 2 diabetes mellitus without complications ([place]) 47/82/9562      Prior to Admission medications   Medication Sig Start Date End Date Taking? Authorizing Provider  amLODipine (NORVASC) 2.5 MG tablet TAKE 1 TABLET(2.5 MG) BY MOUTH DAILY 03/01/20   Einar Pheasant, MD  CONTOUR TEST test strip USE TO TEST BLOOD SUGAR ONCE EVERY WEEK 02/13/19   Einar Pheasant, MD  diphenhydrAMINE (BENADRYL) 25 mg capsule Take 25 mg at bedtime as needed by mouth for sleep.     [provider]  FEROSUL 325 (65 Fe) MG tablet TAKE 1 TABLET(325 MG) BY MOUTH DAILY 10/17/19   Einar Pheasant, MD  hydrocortisone 2.5 % ointment Apply topically. 01/22/20   [provider]  losartan (COZAAR) 100 MG tablet Take 1 tablet (100 mg total) by mouth daily. 05/02/19   Einar Pheasant, MD  Multiple Vitamin (MULTIVITAMIN WITH MINERALS) TABS tablet Take 1 tablet daily by mouth.    [provider]  pantoprazole (PROTONIX) 40 MG tablet TAKE 1 TABLET(40 MG) BY MOUTH DAILY 10/20/19   Einar Pheasant, MD  simvastatin (ZOCOR) 10 MG tablet TAKE 1 TABLET BY MOUTH EVERY DAY 01/19/20   Einar Pheasant, MD  warfarin (COUMADIN) 2.5 MG tablet Take 2.5 mg by mouth. 12/26/19   [provider]    Allergies Penicillins and Penicillin v  potassium    Social History Social History   Tobacco Use  . Smoking status: Never Smoker  . Smokeless tobacco: Never Used  Vaping Use  . Vaping Use: Never used  Substance Use Topics  . Alcohol use: No    Alcohol/week: 0.0 standard drinks  . Drug use: No    Review of Systems Patient denies headaches, rhinorrhea, blurry vision, numbness, shortness of breath, chest pain, edema, cough, abdominal pain, nausea, vomiting, diarrhea, dysuria, fevers, rashes or hallucinations unless otherwise stated above in HPI. ____________________________________________   PHYSICAL EXAM:  VITAL SIGNS: Vitals:   03/19/20 2219 03/20/20 0214  BP: 123/82   Pulse: (!) 105 90  Resp: 18   Temp: 97.6 F (36.4 C)   SpO2: 97%     Constitutional: Alert and oriented. Well appearing and in no acute distress. Eyes: Conjunctivae are normal.  Head: Atraumatic. Nose: No congestion/rhinnorhea. Mouth/Throat: Mucous membranes are moist.   Neck: Painless ROM.  Cardiovascular:   Good peripheral circulation. Respiratory: Normal respiratory effort.  No retractions.  Gastrointestinal: Soft and nontender.  Musculoskeletal:  MAE with painless ROM.  No lower extremity tenderness .  No joint effusions. Neurologic:  Normal speech and language. No gross focal neurologic deficits are appreciated.  Skin:  Skin is warm, dry and intact. No rash noted. Psychiatric: Mood and affect are normal. Speech and behavior are normal.  ____________________________________________   LABS (all labs ordered are listed, but only abnormal results are displayed)  No results found for this or any previous visit (from the past 24 hour(s)). ____________________________________________ ____________________________________________  [id number]  I personally reviewed all radiographic images ordered to evaluate for the above acute complaints and reviewed radiology reports and findings.  These findings were personally discussed with the  patient.  Please see medical record for radiology report.  ____________________________________________   PROCEDURES  Procedure(s) performed:  Procedures    Critical Care performed: no ____________________________________________   INITIAL IMPRESSION / ASSESSMENT AND PLAN / ED COURSE  Pertinent labs & imaging results that were available during my care of the patient were reviewed by me and considered in my medical decision making (see chart for details).  DDX: Fracture, contusion, SDH, IPH, concussion  Anita Walker is a 80 y.o. who presents to the ED with presentation as described above.  Patient nontoxic-appearing.  Imaging ordered in triage for the above differential was reassuring.  She has no neurodeficits.  No signs of fracture or other injury.  Patient is asymptomatic at this time and appropriate for outpatient follow-up.    The patient was evaluated in Emergency Department today for the symptoms described in the history of present illness. He/she was evaluated in the context of the global COVID-19 pandemic, which necessitated consideration that the patient might be at risk for infection with the SARS-CoV-2 virus that causes COVID-19. Institutional protocols and algorithms that pertain to the evaluation of patients at risk for COVID-19 are in a state of rapid change based on information released by regulatory bodies including the CDC and federal and state organizations. These policies and algorithms were followed during the patient's care in the ED.   ____________________________________________   FINAL CLINICAL IMPRESSION(S) / ED DIAGNOSES  Final diagnoses:  Fall, initial encounter  Acute pain of right shoulder      NEW MEDICATIONS STARTED DURING THIS VISIT:  New Prescriptions   No medications on file     Note:  This document was prepared using Dragon voice recognition software and may include unintentional dictation errors.     Merlyn Lot,  MD 03/20/20 [phone]

## 2020-03-23 ENCOUNTER — Ambulatory Visit: Payer: Medicare Other

## 2020-03-23 DIAGNOSIS — S32038D Other fracture of third lumbar vertebra, subsequent encounter for fracture with routine healing: Secondary | ICD-10-CM | POA: Diagnosis not present

## 2020-03-23 DIAGNOSIS — K219 Gastro-esophageal reflux disease without esophagitis: Secondary | ICD-10-CM | POA: Diagnosis not present

## 2020-03-23 DIAGNOSIS — Z9181 History of falling: Secondary | ICD-10-CM | POA: Diagnosis not present

## 2020-03-23 DIAGNOSIS — R5381 Other malaise: Secondary | ICD-10-CM | POA: Diagnosis not present

## 2020-03-23 DIAGNOSIS — D6859 Other primary thrombophilia: Secondary | ICD-10-CM | POA: Diagnosis not present

## 2020-03-23 DIAGNOSIS — G894 Chronic pain syndrome: Secondary | ICD-10-CM | POA: Diagnosis not present

## 2020-03-23 DIAGNOSIS — Z86711 Personal history of pulmonary embolism: Secondary | ICD-10-CM | POA: Diagnosis not present

## 2020-03-23 DIAGNOSIS — I10 Essential (primary) hypertension: Secondary | ICD-10-CM | POA: Diagnosis not present

## 2020-03-23 DIAGNOSIS — D649 Anemia, unspecified: Secondary | ICD-10-CM | POA: Diagnosis not present

## 2020-03-23 DIAGNOSIS — Z7901 Long term (current) use of anticoagulants: Secondary | ICD-10-CM | POA: Diagnosis not present

## 2020-03-23 DIAGNOSIS — E1042 Type 1 diabetes mellitus with diabetic polyneuropathy: Secondary | ICD-10-CM | POA: Diagnosis not present

## 2020-03-23 DIAGNOSIS — M47816 Spondylosis without myelopathy or radiculopathy, lumbar region: Secondary | ICD-10-CM | POA: Diagnosis not present

## 2020-03-23 DIAGNOSIS — Z853 Personal history of malignant neoplasm of breast: Secondary | ICD-10-CM | POA: Diagnosis not present

## 2020-03-23 DIAGNOSIS — M48061 Spinal stenosis, lumbar region without neurogenic claudication: Secondary | ICD-10-CM | POA: Diagnosis not present

## 2020-03-23 DIAGNOSIS — M5117 Intervertebral disc disorders with radiculopathy, lumbosacral region: Secondary | ICD-10-CM | POA: Diagnosis not present

## 2020-03-23 DIAGNOSIS — E538 Deficiency of other specified B group vitamins: Secondary | ICD-10-CM | POA: Diagnosis not present

## 2020-03-24 DIAGNOSIS — M5117 Intervertebral disc disorders with radiculopathy, lumbosacral region: Secondary | ICD-10-CM | POA: Diagnosis not present

## 2020-03-24 DIAGNOSIS — E1042 Type 1 diabetes mellitus with diabetic polyneuropathy: Secondary | ICD-10-CM | POA: Diagnosis not present

## 2020-03-24 DIAGNOSIS — S32038D Other fracture of third lumbar vertebra, subsequent encounter for fracture with routine healing: Secondary | ICD-10-CM | POA: Diagnosis not present

## 2020-03-24 DIAGNOSIS — E538 Deficiency of other specified B group vitamins: Secondary | ICD-10-CM | POA: Diagnosis not present

## 2020-03-24 DIAGNOSIS — M47816 Spondylosis without myelopathy or radiculopathy, lumbar region: Secondary | ICD-10-CM | POA: Diagnosis not present

## 2020-03-24 DIAGNOSIS — I10 Essential (primary) hypertension: Secondary | ICD-10-CM | POA: Diagnosis not present

## 2020-03-25 ENCOUNTER — Other Ambulatory Visit: Payer: Self-pay

## 2020-03-25 ENCOUNTER — Ambulatory Visit (INDEPENDENT_AMBULATORY_CARE_PROVIDER_SITE_OTHER): Payer: Medicare Other | Admitting: Internal Medicine

## 2020-03-25 ENCOUNTER — Encounter: Payer: Self-pay | Admitting: Internal Medicine

## 2020-03-25 VITALS — BP 118/70 | HR 95 | Temp 96.7°F | Resp 16 | Ht 69.0 in | Wt 237.4 lb

## 2020-03-25 DIAGNOSIS — Z9181 History of falling: Secondary | ICD-10-CM

## 2020-03-25 DIAGNOSIS — I10 Essential (primary) hypertension: Secondary | ICD-10-CM

## 2020-03-25 DIAGNOSIS — E7800 Pure hypercholesterolemia, unspecified: Secondary | ICD-10-CM | POA: Diagnosis not present

## 2020-03-25 DIAGNOSIS — G319 Degenerative disease of nervous system, unspecified: Secondary | ICD-10-CM

## 2020-03-25 DIAGNOSIS — Z7901 Long term (current) use of anticoagulants: Secondary | ICD-10-CM | POA: Diagnosis not present

## 2020-03-25 DIAGNOSIS — E119 Type 2 diabetes mellitus without complications: Secondary | ICD-10-CM

## 2020-03-25 DIAGNOSIS — E1142 Type 2 diabetes mellitus with diabetic polyneuropathy: Secondary | ICD-10-CM | POA: Diagnosis not present

## 2020-03-25 NOTE — Progress Notes (Signed)
[name], female   DOB: 1940/06/15, 80 y.o.   MRN: [id number]   Subjective:    [name], female    DOB: [birth year], 80 y.o.   MRN: [id number]  HPI This visit occurred during the SARS-CoV-2 public health emergency.  Safety protocols were in place, including screening questions prior to the visit, additional usage of staff PPE, and extensive cleaning of exam room while observing appropriate contact time as indicated for disinfecting solutions.  Patient here for a scheduled follow up. She fell beginning of December and presented to ER 02/09/20 with back and hip pain.  Plain films negative. Discharged.  Returned with increased pain and inability to walk.  Transferred to [facility]. Admitted 02/10/20 - 02/16/20.  MRI - chronic L3 compression fracture with degenerative change of he spine, moderate spinal canal stenosis and mild to moderate neural foraminal narrowing.  Pain controlled and was discharged to SNF.  Returned home 03/19/20 and was walking to her sister's house.  [place] when went to get on the porch. Injured her right shoulder.  Xray unrevealing.  Discharged.  No significant pain now.  No headache or dizziness.  No chest pain or sob.  Eating.  No nausea or vomiting. Planning to start PT next week.     Past Medical History:  Diagnosis Date  . Anemia   . Arthritis   . Atrial fibrillation ([place])   . Breast cancer ([place])    s/p lumpectomy 1992.  s/p chemo and xrt left breast  . Diabetes mellitus ([place])   . Edema    feet/legs  . Gastric ulcer   . GERD (gastroesophageal reflux disease)   . HOH (hard of hearing)    aides  . Hypercholesterolemia   . Hypertension   . Personal history of chemotherapy   . Pulmonary emboli [facility])    Past Surgical History:  Procedure Laterality Date  . BREAST LUMPECTOMY  03/06/1990   left breast  . CATARACT EXTRACTION W/PHACO Right 12/26/2016   Procedure: CATARACT EXTRACTION PHACO AND INTRAOCULAR LENS PLACEMENT (IOC)-RIGHT  DIABETIC;  Surgeon: Birder Robson, MD;  Location: [facility] ORS;  Service: Ophthalmology;  Laterality: Right;  [place] 00:47 AP% 24.5 CDE 11.62 Fluid pack lot # [id number]  . CATARACT EXTRACTION W/PHACO Left 01/23/2017   Procedure: CATARACT EXTRACTION PHACO AND INTRAOCULAR LENS PLACEMENT (IOC);  Surgeon: Birder Robson, MD;  Location: [facility] ORS;  Service: Ophthalmology;  Laterality: Left;  [place] 00:50 AP% 16.1 CDE 8.18 Fluid pack lot #[id number]  . COLONOSCOPY WITH PROPOFOL N/A 07/08/2019   Procedure: COLONOSCOPY WITH PROPOFOL;  Surgeon: Lucilla Lame, MD;  Location: [facility] ENDOSCOPY;  Service: Endoscopy;  Laterality: N/A;   Family History  Problem Relation Age of Onset  . Heart disease Father   . Heart disease Brother        s/p CABG  . Breast cancer Daughter   . Colon cancer Neg Hx    Social History   Socioeconomic History  . Marital status: Widowed    Spouse name: Not on file  . Number of children: Not on file  . Years of education: Not on file  . Highest education level: Not on file  Occupational History  . Not on file  Tobacco Use  . Smoking status: Never Smoker  . Smokeless tobacco: Never Used  Vaping Use  . Vaping Use: Never used  Substance and Sexual Activity  . Alcohol use: No    Alcohol/week: 0.0 standard drinks  . Drug use: No  .  Sexual activity: Never  Other Topics Concern  . Not on file  Social History Narrative  . Not on file   Social Determinants of Health   Financial Resource Strain: Not on file  Food Insecurity: Not on file  Transportation Needs: Not on file  Physical Activity: Not on file  Stress: Not on file  Social Connections: Not on file    Outpatient Encounter Medications as of 03/25/2020  Medication Sig  . amLODipine (NORVASC) 2.5 MG tablet TAKE 1 TABLET(2.5 MG) BY MOUTH DAILY  . CONTOUR TEST test strip USE TO TEST BLOOD SUGAR ONCE EVERY WEEK  . diphenhydrAMINE (BENADRYL) 25 mg capsule Take 25 mg at bedtime as needed by mouth for sleep.   Marland Kitchen FEROSUL  325 (65 Fe) MG tablet TAKE 1 TABLET(325 MG) BY MOUTH DAILY  . hydrocortisone 2.5 % ointment Apply topically.  Marland Kitchen losartan (COZAAR) 100 MG tablet Take 1 tablet (100 mg total) by mouth daily.  . Multiple Vitamin (MULTIVITAMIN WITH MINERALS) TABS tablet Take 1 tablet daily by mouth.  . pantoprazole (PROTONIX) 40 MG tablet TAKE 1 TABLET(40 MG) BY MOUTH DAILY  . simvastatin (ZOCOR) 10 MG tablet TAKE 1 TABLET BY MOUTH EVERY DAY  . warfarin (COUMADIN) 2.5 MG tablet Take 2.5 mg by mouth.   No facility-administered encounter medications on file as of 03/25/2020.    Review of Systems  Constitutional: Negative for appetite change and unexpected weight change.  HENT: Negative for congestion and sinus pressure.   Respiratory: Negative for cough, chest tightness and shortness of breath.   Cardiovascular: Negative for chest pain and palpitations.       No increased leg swelling.   Gastrointestinal: Negative for abdominal pain, diarrhea, nausea and vomiting.  Genitourinary: Negative for difficulty urinating and dysuria.  Musculoskeletal: Negative for joint swelling and myalgias.  Skin: Negative for color change and rash.  Neurological: Negative for dizziness, light-headedness and headaches.  Psychiatric/Behavioral: Negative for agitation and dysphoric mood.       Objective:    Physical Exam Vitals reviewed.  Constitutional:      General: She is not in acute distress.    Appearance: Normal appearance.  HENT:     Head: Normocephalic and atraumatic.     Right Ear: External ear normal.     Left Ear: External ear normal.     Mouth/Throat:     Mouth: Oropharynx is clear and moist.  Eyes:     General: No scleral icterus.       Right eye: No discharge.        Left eye: No discharge.     Conjunctiva/sclera: Conjunctivae normal.  Neck:     Thyroid: No thyromegaly.  Cardiovascular:     Rate and Rhythm: Normal rate and regular rhythm.  Pulmonary:     Effort: No respiratory distress.     Breath  sounds: Normal breath sounds. No wheezing.  Abdominal:     General: Bowel sounds are normal.     Palpations: Abdomen is soft.     Tenderness: There is no abdominal tenderness.  Musculoskeletal:        General: No tenderness or edema.     Cervical back: Neck supple. No tenderness.     Comments: No increased swelling.   Lymphadenopathy:     Cervical: No cervical adenopathy.  Skin:    Findings: No erythema or rash.  Neurological:     Mental Status: She is alert.  Psychiatric:        Mood and Affect:  Mood normal.        Behavior: Behavior normal.     BP 118/70   Pulse 95   Temp (!) 96.7 F (35.9 C) (Oral)   Resp 16   Ht '5\' 9"'  (1.753 m)   Wt 237 lb 6.4 oz (107.7 kg)   SpO2 98%   BMI 35.06 kg/m  Wt Readings from Last 3 Encounters:  03/25/20 237 lb 6.4 oz (107.7 kg)  01/27/20 240 lb (108.9 kg)  01/26/20 242 lb 6.4 oz (110 kg)     Lab Results  Component Value Date   WBC 7.4 03/25/2020   HGB 13.4 03/25/2020   HCT 39.4 03/25/2020   PLT 248 03/25/2020   GLUCOSE 107 (H) 03/25/2020   CHOL 156 12/11/2018   TRIG 100.0 12/11/2018   HDL 55.90 12/11/2018   LDLCALC 80 12/11/2018   ALT 16 03/25/2020   AST 21 03/25/2020   NA 135 03/25/2020   K 4.3 03/25/2020   CL 99 03/25/2020   CREATININE 0.92 03/25/2020   BUN 12 03/25/2020   CO2 23 03/25/2020   TSH 3.15 05/06/2019   INR 3.3 (H) 03/25/2020   HGBA1C 5.8 (H) 03/25/2020   MICROALBUR <0.7 11/14/2017    DG PAIN CLINIC C-ARM 1-60 MIN NO REPORT  Result Date: 01/27/2020 Fluoro was used, but no Radiologist interpretation will be provided. Please refer to "NOTES" tab for provider progress note.      Assessment & Plan:   Problem List Items Addressed This Visit    At high risk for falls (Chronic)    Recent fall.  Planning for home PT to start next week.  Continue using walker.  Follow.       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  Mild.  Stable.       Chronic anticoagulation (Coumadin) (Chronic)    On  coumadin.  Check pt/inr today.       Relevant Orders   CBC with Differential/Platelet (Completed)   Protime-INR (Completed)   Diabetic polyneuropathy associated with type 2 diabetes mellitus ([place])    Sugars have been well controlled.  Low carb diet and exercise.  Follow met b and a1c.  Continue walker to help with balance.       Essential hypertension - Primary    Continue losartan and amlodipine.  Blood pressure doing well.  Follow pressures.  Follow metabolic panel.       Relevant Orders   Basic metabolic panel (Completed)   Hypercholesteremia    On simvastatin.  Low cholesterol diet and exercise.  Follow lipid panel and liver function tests.        Relevant Orders   Hepatic function panel (Completed)   Hypertension    Continue losartan and amlodipine.  Blood pressure doing well.       Type 2 diabetes mellitus without complications (HCC)    Low carb diet and exercise.  Follow met b and a1c.       Relevant Orders   Hemoglobin A1c (Completed)       Einar Pheasant, MD

## 2020-03-26 LAB — BASIC METABOLIC PANEL
BUN/Creatinine Ratio: 13 (ref 12–28)
BUN: 12 mg/dL (ref 8–27)
CO2: 23 mmol/L (ref 20–29)
Calcium: 9.7 mg/dL (ref 8.7–10.3)
Chloride: 99 mmol/L (ref 96–106)
Creatinine, Ser: 0.92 mg/dL (ref 0.57–1.00)
GFR calc Af Amer: 67 mL/min/{1.73_m2} (ref >59)
GFR calc non Af Amer: 58 mL/min/{1.73_m2} — ABNORMAL LOW (ref >59)
Glucose: 107 mg/dL — ABNORMAL HIGH (ref 65–99)
Potassium: 4.3 mmol/L (ref 3.5–5.2)
Sodium: 135 mmol/L (ref 134–144)

## 2020-03-26 LAB — CBC WITH DIFFERENTIAL/PLATELET
Basophils Absolute: 0.1 10*3/uL (ref 0.0–0.2)
Basos: 1 %
EOS (ABSOLUTE): 0.1 10*3/uL (ref 0.0–0.4)
Eos: 1 %
Hematocrit: 39.4 % (ref 34.0–46.6)
Hemoglobin: 13.4 g/dL (ref 11.1–15.9)
Immature Grans (Abs): 0.1 10*3/uL (ref 0.0–0.1)
Immature Granulocytes: 1 %
Lymphocytes Absolute: 1.9 10*3/uL (ref 0.7–3.1)
Lymphs: 25 %
MCH: 29.9 pg (ref 26.6–33.0)
MCHC: 34 g/dL (ref 31.5–35.7)
MCV: 88 fL (ref 79–97)
Monocytes Absolute: 1.2 10*3/uL — ABNORMAL HIGH (ref 0.1–0.9)
Monocytes: 16 %
Neutrophils Absolute: 4.2 10*3/uL (ref 1.4–7.0)
Neutrophils: 56 %
Platelets: 248 10*3/uL (ref 150–450)
RBC: 4.48 x10E6/uL (ref 3.77–5.28)
RDW: 13.3 % (ref 11.7–15.4)
WBC: 7.4 10*3/uL (ref 3.4–10.8)

## 2020-03-26 LAB — HEPATIC FUNCTION PANEL
ALT: 16 IU/L (ref 0–32)
AST: 21 IU/L (ref 0–40)
Albumin: 4 g/dL (ref 3.6–4.6)
Alkaline Phosphatase: 87 IU/L (ref 44–121)
Bilirubin Total: 0.4 mg/dL (ref 0.0–1.2)
Bilirubin, Direct: 0.16 mg/dL (ref 0.00–0.40)
Total Protein: 6.5 g/dL (ref 6.0–8.5)

## 2020-03-26 LAB — HEMOGLOBIN A1C
Est. average glucose Bld gHb Est-mCnc: 120 mg/dL
Hgb A1c MFr Bld: 5.8 % — ABNORMAL HIGH (ref 4.8–5.6)

## 2020-03-26 LAB — PROTIME-INR
INR: 3.3 — ABNORMAL HIGH (ref 0.9–1.2)
Prothrombin Time: 33.5 s — ABNORMAL HIGH (ref 9.1–12.0)

## 2020-03-28 NOTE — Assessment & Plan Note (Signed)
Sugars have been well controlled.  Low carb diet and exercise.  Follow met b and a1c.  Continue walker to help with balance.

## 2020-03-28 NOTE — Assessment & Plan Note (Signed)
On simvastatin.  Low cholesterol diet and exercise.  Follow lipid panel and liver function tests.   

## 2020-03-28 NOTE — Assessment & Plan Note (Signed)
Recent fall.  Planning for home PT to start next week.  Continue using walker.  Follow.

## 2020-03-28 NOTE — Assessment & Plan Note (Addendum)
Continue losartan and amlodipine.  Blood pressure doing well.  Follow pressures.  Follow metabolic panel.  

## 2020-03-28 NOTE — Assessment & Plan Note (Signed)
Found on recent CT head.  Mild.  Stable.

## 2020-03-28 NOTE — Assessment & Plan Note (Signed)
Low carb diet and exercise.  Follow met b and a1c.  

## 2020-03-28 NOTE — Assessment & Plan Note (Signed)
On coumadin.  Check pt/inr today.  

## 2020-03-28 NOTE — Assessment & Plan Note (Signed)
Continue losartan and amlodipine.  Blood pressure doing well.

## 2020-03-29 DIAGNOSIS — E1042 Type 1 diabetes mellitus with diabetic polyneuropathy: Secondary | ICD-10-CM | POA: Diagnosis not present

## 2020-03-29 DIAGNOSIS — M47816 Spondylosis without myelopathy or radiculopathy, lumbar region: Secondary | ICD-10-CM | POA: Diagnosis not present

## 2020-03-29 DIAGNOSIS — S32038D Other fracture of third lumbar vertebra, subsequent encounter for fracture with routine healing: Secondary | ICD-10-CM | POA: Diagnosis not present

## 2020-03-29 DIAGNOSIS — I10 Essential (primary) hypertension: Secondary | ICD-10-CM | POA: Diagnosis not present

## 2020-03-29 DIAGNOSIS — M5117 Intervertebral disc disorders with radiculopathy, lumbosacral region: Secondary | ICD-10-CM | POA: Diagnosis not present

## 2020-03-29 DIAGNOSIS — E538 Deficiency of other specified B group vitamins: Secondary | ICD-10-CM | POA: Diagnosis not present

## 2020-03-30 DIAGNOSIS — E538 Deficiency of other specified B group vitamins: Secondary | ICD-10-CM | POA: Diagnosis not present

## 2020-03-30 DIAGNOSIS — S32038D Other fracture of third lumbar vertebra, subsequent encounter for fracture with routine healing: Secondary | ICD-10-CM | POA: Diagnosis not present

## 2020-03-30 DIAGNOSIS — M5117 Intervertebral disc disorders with radiculopathy, lumbosacral region: Secondary | ICD-10-CM | POA: Diagnosis not present

## 2020-03-30 DIAGNOSIS — I10 Essential (primary) hypertension: Secondary | ICD-10-CM | POA: Diagnosis not present

## 2020-03-30 DIAGNOSIS — E1042 Type 1 diabetes mellitus with diabetic polyneuropathy: Secondary | ICD-10-CM | POA: Diagnosis not present

## 2020-03-30 DIAGNOSIS — M47816 Spondylosis without myelopathy or radiculopathy, lumbar region: Secondary | ICD-10-CM | POA: Diagnosis not present

## 2020-03-31 ENCOUNTER — Telehealth: Payer: Self-pay

## 2020-03-31 DIAGNOSIS — I10 Essential (primary) hypertension: Secondary | ICD-10-CM | POA: Diagnosis not present

## 2020-03-31 DIAGNOSIS — M47816 Spondylosis without myelopathy or radiculopathy, lumbar region: Secondary | ICD-10-CM | POA: Diagnosis not present

## 2020-03-31 DIAGNOSIS — E1042 Type 1 diabetes mellitus with diabetic polyneuropathy: Secondary | ICD-10-CM | POA: Diagnosis not present

## 2020-03-31 DIAGNOSIS — E538 Deficiency of other specified B group vitamins: Secondary | ICD-10-CM | POA: Diagnosis not present

## 2020-03-31 DIAGNOSIS — S32038D Other fracture of third lumbar vertebra, subsequent encounter for fracture with routine healing: Secondary | ICD-10-CM | POA: Diagnosis not present

## 2020-03-31 DIAGNOSIS — M5117 Intervertebral disc disorders with radiculopathy, lumbosacral region: Secondary | ICD-10-CM | POA: Diagnosis not present

## 2020-03-31 NOTE — Telephone Encounter (Signed)
Pt states that she was supposed to have test strips called in but the pharmacy has not received them. Please advise

## 2020-04-01 ENCOUNTER — Other Ambulatory Visit: Payer: Self-pay

## 2020-04-01 ENCOUNTER — Other Ambulatory Visit: Payer: Self-pay | Admitting: Internal Medicine

## 2020-04-01 ENCOUNTER — Other Ambulatory Visit (INDEPENDENT_AMBULATORY_CARE_PROVIDER_SITE_OTHER): Payer: Medicare Other

## 2020-04-01 DIAGNOSIS — Z7901 Long term (current) use of anticoagulants: Secondary | ICD-10-CM

## 2020-04-01 LAB — PROTIME-INR
INR: 4 ratio — ABNORMAL HIGH (ref 0.8–1.0)
Prothrombin Time: 44.3 s — ABNORMAL HIGH (ref 9.6–13.1)

## 2020-04-01 MED ORDER — CONTOUR TEST VI STRP: ORAL_STRIP | 1 refills | Status: DC

## 2020-04-01 NOTE — Telephone Encounter (Signed)
Test strips sent in. 

## 2020-04-01 NOTE — Progress Notes (Signed)
Order placed for pt/inr 

## 2020-04-02 ENCOUNTER — Other Ambulatory Visit (INDEPENDENT_AMBULATORY_CARE_PROVIDER_SITE_OTHER): Payer: Medicare Other

## 2020-04-02 ENCOUNTER — Other Ambulatory Visit: Payer: Self-pay | Admitting: *Deleted

## 2020-04-02 ENCOUNTER — Ambulatory Visit (INDEPENDENT_AMBULATORY_CARE_PROVIDER_SITE_OTHER): Payer: Medicare Other

## 2020-04-02 ENCOUNTER — Ambulatory Visit: Payer: Medicare Other

## 2020-04-02 VITALS — Ht 69.0 in | Wt 237.0 lb

## 2020-04-02 DIAGNOSIS — Z0000 Encounter for general adult medical examination without abnormal findings: Secondary | ICD-10-CM | POA: Diagnosis not present

## 2020-04-02 DIAGNOSIS — Z7901 Long term (current) use of anticoagulants: Secondary | ICD-10-CM

## 2020-04-02 LAB — PROTIME-INR
INR: 3 ratio — ABNORMAL HIGH (ref 0.8–1.0)
Prothrombin Time: 33.2 s — ABNORMAL HIGH (ref 9.6–13.1)

## 2020-04-02 NOTE — Progress Notes (Signed)
Subjective:   Jo Ann R Martinique is a 80 y.o. female who presents for Medicare Annual (Subsequent) preventive examination.  Review of Systems    No ROS.  Medicare Wellness Virtual Visit.     Cardiac Risk Factors include: advanced age (>3men, >18 women);hypertension;diabetes mellitus     Objective:    Today's Vitals   04/02/20 1434  Weight: 237 lb (107.5 kg)  Height: 5\' 9"  (1.753 m)   Body mass index is 35 kg/m.  Advanced Directives 04/02/2020 01/01/2020 11/18/2019 10/21/2019 09/18/2019 09/17/2019 08/14/2019  Does Patient Have a Medical Advance Directive? Yes Yes Yes No Yes No No  Type of Paramedic of [place];Living will - - - [place];Living will - -  Does patient want to make changes to medical advance directive? No - Patient declined - - - - - -  Copy of [place] in Chart? Yes - validated most recent copy scanned in chart (See row information) - - - No - copy requested - -  Would patient like information on creating a medical advance directive? - - - No - Patient declined - - No - Patient declined    Current Medications (verified) Outpatient Encounter Medications as of 04/02/2020  Medication Sig  . amLODipine (NORVASC) 2.5 MG tablet TAKE 1 TABLET(2.5 MG) BY MOUTH DAILY  . diphenhydrAMINE (BENADRYL) 25 mg capsule Take 25 mg at bedtime as needed by mouth for sleep.   Marland Kitchen FEROSUL 325 (65 Fe) MG tablet TAKE 1 TABLET(325 MG) BY MOUTH DAILY  . glucose blood (CONTOUR TEST) test strip USE TO TEST BLOOD SUGAR ONCE EVERY WEEK  . hydrocortisone 2.5 % ointment Apply topically.  Marland Kitchen losartan (COZAAR) 100 MG tablet Take 1 tablet (100 mg total) by mouth daily.  . Multiple Vitamin (MULTIVITAMIN WITH MINERALS) TABS tablet Take 1 tablet daily by mouth.  . pantoprazole (PROTONIX) 40 MG tablet TAKE 1 TABLET(40 MG) BY MOUTH DAILY  . simvastatin (ZOCOR) 10 MG tablet TAKE 1 TABLET BY MOUTH EVERY DAY  . warfarin (COUMADIN) 2.5 MG tablet Take  2.5 mg by mouth.   No facility-administered encounter medications on file as of 04/02/2020.    Allergies (verified) Penicillins and Penicillin v potassium   History: Past Medical History:  Diagnosis Date  . Anemia   . Arthritis   . Atrial fibrillation ([place])   . Breast cancer ([place])    s/p lumpectomy 1992.  s/p chemo and xrt left breast  . Diabetes mellitus ([place])   . Edema    feet/legs  . Gastric ulcer   . GERD (gastroesophageal reflux disease)   . HOH (hard of hearing)    aides  . Hypercholesterolemia   . Hypertension   . Personal history of chemotherapy   . Pulmonary emboli [facility])    Past Surgical History:  Procedure Laterality Date  . BREAST LUMPECTOMY  03/06/1990   left breast  . CATARACT EXTRACTION W/PHACO Right 12/26/2016   Procedure: CATARACT EXTRACTION PHACO AND INTRAOCULAR LENS PLACEMENT (IOC)-RIGHT DIABETIC;  Surgeon: Birder Robson, MD;  Location: [facility] ORS;  Service: Ophthalmology;  Laterality: Right;  [place] 00:47 AP% 24.5 CDE 11.62 Fluid pack lot # [id number]  . CATARACT EXTRACTION W/PHACO Left 01/23/2017   Procedure: CATARACT EXTRACTION PHACO AND INTRAOCULAR LENS PLACEMENT (IOC);  Surgeon: Birder Robson, MD;  Location: [facility] ORS;  Service: Ophthalmology;  Laterality: Left;  [place] 00:50 AP% 16.1 CDE 8.18 Fluid pack lot [id number]  . COLONOSCOPY WITH PROPOFOL N/A 07/08/2019   Procedure: COLONOSCOPY  WITH PROPOFOL;  Surgeon: Lucilla Lame, MD;  Location[facility] ENDOSCOPY;  Service: Endoscopy;  Laterality: N/A;   Family History  Problem Relation Age of Onset  . Heart disease Father   . Heart disease Brother        s/p CABG  . Breast cancer Daughter   . Colon cancer Neg Hx    Social History   Socioeconomic History  . Marital status: Widowed    Spouse name: Not on file  . Number of children: Not on file  . Years of education: Not on file  . Highest education level: Not on file  Occupational History  . Not on file  Tobacco Use  . Smoking status: Never Smoker  .  Smokeless tobacco: Never Used  Vaping Use  . Vaping Use: Never used  Substance and Sexual Activity  . Alcohol use: No    Alcohol/week: 0.0 standard drinks  . Drug use: No  . Sexual activity: Never  Other Topics Concern  . Not on file  Social History Narrative  . Not on file   Social Determinants of Health   Financial Resource Strain: Low Risk   . Difficulty of Paying Living Expenses: Not hard at all  Food Insecurity: No Food Insecurity  . Worried About Charity fundraiser in the Last Year: Never true  . Ran Out of Food in the Last Year: Never true  Transportation Needs: No Transportation Needs  . Lack of Transportation (Medical): No  . Lack of Transportation (Non-Medical): No  Physical Activity: Not on file  Stress: No Stress Concern Present  . Feeling of Stress : Not at all  Social Connections: Not on file    Tobacco Counseling Counseling given: Not Answered   Clinical Intake:           Diabetes: Yes  How often do you need to have someone help you when you read instructions, pamphlets, or other written materials from your doctor or pharmacy?: 1 - Never  Nutrition Risk Assessment: Has the patient had any N/V/D within the last 2 weeks?  No  Does the patient have any non-healing wounds?  No  Has the patient had any unintentional weight loss or weight gain?  No   Diabetes: If diabetic, was a CBG obtained today?  No  Did the patient bring in their glucometer from home?  No  How often do you monitor your CBG's? Every Wednesday.   Financial Strains and Diabetes Management: Are you having any financial strains with the device, your supplies or your medication? No .  Does the patient want to be seen by Chronic Care Management for management of their diabetes?  No  Would the patient like to be referred to a Nutritionist or for Diabetic Management?  No   Diabetic Exams:  Diabetic Eye Exam: Completed 06/25/19 Diabetic foot exam: Due. Followed by pcp. Notes no  changes in feet.  Interpreter Needed?: No    Activities of Daily Living In your present state of health, do you have any difficulty performing the following activities: 04/02/2020  Hearing? Y  Vision? N  Difficulty concentrating or making decisions? N  Walking or climbing stairs? Y  Comment Unsteady gait. Cane in use when walking.  Dressing or bathing? N  Doing errands, shopping? Y  Preparing Food and eating ? Y  Comment Meals prepared from outside dining. Self feeds.  Using the Toilet? N  In the past six months, have you accidently leaked urine? Y  Comment Managed with daily  brief  Do you have problems with loss of bowel control? N  Managing your Medications? Y  Comment Daughter manages  Managing your Finances? Y  Comment Daughter manages  Housekeeping or managing your Housekeeping? N  Some recent data might be hidden    Patient Care Team: Einar Pheasant, MD as PCP - General (Internal Medicine)  Indicate any recent Medical Services you may have received from other than [facility] providers in the past year (date may be approximate).     Assessment:   This is a routine wellness examination for Tempest Hannasch.  I connected with Pricilla Holm today by telephone and verified that I am speaking with the correct person using two identifiers. Location patient: home Location provider: work Persons participating in the virtual visit: patient, Marine scientist.    I discussed the limitations, risks, security and privacy concerns of performing an evaluation and management service by telephone and the availability of in person appointments. The patient expressed understanding and verbally consented to this telephonic visit.    Interactive audio and video telecommunications were attempted between this provider and patient, however failed, due to patient having technical difficulties OR patient did not have access to video capability.  We continued and completed visit with audio only.  Some vital signs may be absent  or patient reported.   Hearing/Vision screen  Hearing Screening   125Hz  250Hz  500Hz  1000Hz  2000Hz  3000Hz  4000Hz  6000Hz  8000Hz   Right ear:           Left ear:           Comments: Followed by [facility] every 3 months  Hearing aid, bilateral  Vision Screening Comments: Followed by [facility]  Wears corrective lenses  Cataract extraction, bilateral  Virtual visit  Dietary issues and exercise activities discussed: Current Exercise Habits: Home exercise routine, Type of exercise: stretching (Physical therapy), Time (Minutes): 30, Frequency (Times/Week): 3, Weekly Exercise (Minutes/Week): 90, Intensity: Mild  Regular diet  Goals    . Follow up with Primary Care Provider     As needed      Depression Screen PHQ 2/9 Scores 04/02/2020 12/11/2019 09/18/2019 08/14/2019 03/21/2019 03/11/2019 08/08/2018  PHQ - 2 Score 0 0 0 0 0 0 0  PHQ- 9 Score - - - - - - 0    Fall Risk Fall Risk  04/02/2020 01/27/2020 01/01/2020 12/11/2019 09/18/2019  Falls in the past year? - 0 0 1 0  Number falls in past yr: - - - 1 0  Injury with Fall? - - - 1 0  Risk for fall due to : Impaired balance/gait - Medication side effect History of fall(s) -  Follow up - - Falls prevention discussed - -    FALL RISK PREVENTION PERTAINING TO THE HOME: Handrails in use when climbing stairs? Yes Home free of loose throw rugs in walkways, pet beds, electrical cords, etc? Yes  Adequate lighting in your home to reduce risk of falls? Yes   ASSISTIVE DEVICES UTILIZED TO PREVENT FALLS: Use of a cane, walker or w/c? Yes   TIMED UP AND GO: Was the test performed? No . Virtual visit.  Cognitive Function: Patient is alert. MMSE/6CIT -unable to complete.  MMSE - Mini Mental State Exam 02/11/2016  Not completed: Refused     6CIT Screen 03/21/2019 03/13/2018 03/02/2017 02/11/2016  What Year? 0 points 0 points 0 points 0 points  What month? 0 points 0 points 0 points 0 points  What time? 0 points 0 points 0 points  0  points  Count back from 20 0 points 0 points 0 points (No Data)  Months in reverse 0 points 4 points 0 points (No Data)  Repeat phrase 2 points - 0 points 2 points  Total Score 2 - 0 -    Immunizations Immunization History  Administered Date(s) Administered  . Fluad Quad(high Dose 65+) 12/11/2018, 12/02/2019  . Influenza Split 11/19/2011, 12/09/2012, 12/27/2017  . Influenza, High Dose Seasonal PF 12/02/2015, 11/07/2016, 11/14/2017  . Influenza,inj,Quad PF,6+ Mos 12/03/2013, 11/12/2014  . Influenza-Unspecified 12/12/2011, 12/11/2012, 12/03/2013, 11/12/2014, 12/02/2015  . PFIZER(Purple Top)SARS-COV-2 Vaccination 03/20/2019, 04/10/2019  . Pneumococcal Conjugate-13 01/14/2014  . Pneumococcal Polysaccharide-23 02/11/2016  . Zoster Recombinat (Shingrix) 09/15/2017, 11/15/2017, 04/03/2018    TDAP status: Due, Education has been provided regarding the importance of this vaccine. Advised may receive this vaccine at local pharmacy or Health Dept. Aware to provide a copy of the vaccination record if obtained from local pharmacy or Health Dept. Verbalized acceptance and understanding. Deferred.   Health Maintenance Health Maintenance  Topic Date Due  . COVID-19 Vaccine (3 - Pfizer risk 4-dose series) 05/08/2019  . FOOT EXAM  12/11/2019  . TETANUS/TDAP  04/02/2021 (Originally 03/21/1959)  . OPHTHALMOLOGY EXAM  06/25/2020  . HEMOGLOBIN A1C  09/22/2020  . MAMMOGRAM  12/23/2020  . INFLUENZA VACCINE  Completed  . DEXA SCAN  Completed  . PNA vac Low Risk Adult  Completed   Colonoscopy- 07/08/19.  Mammogram status: Completed 12/24/19. Repeat every year.MM 3D SCREEN BREAST BILATERAL   Lung Cancer Screening: (Low Dose CT Chest recommended if Age 98-80 years, 30 pack-year currently smoking OR have quit w/in 15years.) does not qualify.   Hepatitis C Screening: does not qualify.  Vision Screening: Recommended annual ophthalmology exams for early detection of glaucoma and other disorders of the  eye. Is the patient up to date with their annual eye exam?  Yes   Dental Screening: Recommended annual dental exams for proper oral hygiene.  Community Resource Referral / Chronic Care Management: CRR required this visit?  No   CCM required this visit?  No      Plan:   Keep all routine maintenance appointments.   Cpe 05/05/20.  I have personally reviewed and noted the following in the patient's chart:   . Medical and social history . Use of alcohol, tobacco or illicit drugs  . Current medications and supplements . Functional ability and status . Nutritional status . Physical activity . Advanced directives . List of other physicians . Hospitalizations, surgeries, and ER visits in previous 12 months . Vitals . Screenings to include cognitive, depression, and falls . Referrals and appointments  In addition, I have reviewed and discussed with patient certain preventive protocols, quality metrics, and best practice recommendations. A written personalized care plan for preventive services as well as general preventive health recommendations were provided to patient via mychart.     Varney Biles, LPN   [id number]

## 2020-04-02 NOTE — Patient Instructions (Addendum)
Anita Walker , Thank you for taking time to come for your Medicare Wellness Visit. I appreciate your ongoing commitment to your health goals. Please review the following plan we discussed and let me know if I can assist you in the future.   These are the goals we discussed: Goals    . Follow up with Primary Care Provider     As needed       This is a list of the screening recommended for you and due dates:  Health Maintenance  Topic Date Due  . COVID-19 Vaccine (3 - Pfizer risk 4-dose series) 05/08/2019  . Complete foot exam   12/11/2019  . Tetanus Vaccine  04/02/2021*  . Eye exam for diabetics  06/25/2020  . Hemoglobin A1C  09/22/2020  . Mammogram  12/23/2020  . Flu Shot  Completed  . DEXA scan (bone density measurement)  Completed  . Pneumonia vaccines  Completed  *Topic was postponed. The date shown is not the original due date.    Immunizations Immunization History  Administered Date(s) Administered  . Fluad Quad(high Dose 65+) 12/11/2018, 12/02/2019  . Influenza Split 11/19/2011, 12/09/2012, 12/27/2017  . Influenza, High Dose Seasonal PF 12/02/2015, 11/07/2016, 11/14/2017  . Influenza,inj,Quad PF,6+ Mos 12/03/2013, 11/12/2014  . Influenza-Unspecified 12/12/2011, 12/11/2012, 12/03/2013, 11/12/2014, 12/02/2015  . PFIZER(Purple Top)SARS-COV-2 Vaccination 03/20/2019, 04/10/2019  . Pneumococcal Conjugate-13 01/14/2014  . Pneumococcal Polysaccharide-23 02/11/2016  . Zoster Recombinat (Shingrix) 09/15/2017, 11/15/2017, 04/03/2018   Keep all routine maintenance appointments.   Cpe 05/05/20.  Advanced directives: on file  Conditions/risks identified: none new  Follow up in one year for your annual wellness visit    Preventive Care 65 Years and Older, Female Preventive care refers to lifestyle choices and visits with your health care provider that can promote health and wellness. What does preventive care include?  A yearly physical exam. This is also called an annual  well check.  Dental exams once or twice a year.  Routine eye exams. Ask your health care provider how often you should have your eyes checked.  Personal lifestyle choices, including:  Daily care of your teeth and gums.  Regular physical activity.  Eating a healthy diet.  Avoiding tobacco and drug use.  Limiting alcohol use.  Practicing safe sex.  Taking low-dose aspirin every day.  Taking vitamin and mineral supplements as recommended by your health care provider. What happens during an annual well check? The services and screenings done by your health care provider during your annual well check will depend on your age, overall health, lifestyle risk factors, and family history of disease. Counseling  Your health care provider may ask you questions about your:  Alcohol use.  Tobacco use.  Drug use.  Emotional well-being.  Home and relationship well-being.  Sexual activity.  Eating habits.  History of falls.  Memory and ability to understand (cognition).  Work and work Statistician.  Reproductive health. Screening  You may have the following tests or measurements:  Height, weight, and BMI.  Blood pressure.  Lipid and cholesterol levels. These may be checked every 5 years, or more frequently if you are over 63 years old.  Skin check.  Lung cancer screening. You may have this screening every year starting at age 10 if you have a 30-pack-year history of smoking and currently smoke or have quit within the past 15 years.  Fecal occult blood test (FOBT) of the stool. You may have this test every year starting at age 57.  Flexible sigmoidoscopy or colonoscopy.  You may have a sigmoidoscopy every 5 years or a colonoscopy every 10 years starting at age 28.  Hepatitis C blood test.  Hepatitis B blood test.  Sexually transmitted disease (STD) testing.  Diabetes screening. This is done by checking your blood sugar (glucose) after you have not eaten for a while  (fasting). You may have this done every 1-3 years.  Bone density scan. This is done to screen for osteoporosis. You may have this done starting at age 70.  Mammogram. This may be done every 1-2 years. Talk to your health care provider about how often you should have regular mammograms. Talk with your health care provider about your test results, treatment options, and if necessary, the need for more tests. Vaccines  Your health care provider may recommend certain vaccines, such as:  Influenza vaccine. This is recommended every year.  Tetanus, diphtheria, and acellular pertussis (Tdap, Td) vaccine. You may need a Td booster every 10 years.  Zoster vaccine. You may need this after age 77.  Pneumococcal 13-valent conjugate (PCV13) vaccine. One dose is recommended after age 6.  Pneumococcal polysaccharide (PPSV23) vaccine. One dose is recommended after age 40. Talk to your health care provider about which screenings and vaccines you need and how often you need them. This information is not intended to replace advice given to you by your health care provider. Make sure you discuss any questions you have with your health care provider. Document Released: 03/19/2015 Document Revised: 11/10/2015 Document Reviewed: 12/22/2014 Elsevier Interactive Patient Education  2017 [place] Prevention in the Home Falls can cause injuries. They can happen to people of all ages. There are many things you can do to make your home safe and to help prevent falls. What can I do on the outside of my home?  Regularly fix the edges of walkways and driveways and fix any cracks.  Remove anything that might make you trip as you walk through a door, such as a raised step or threshold.  Trim any bushes or trees on the path to your home.  Use bright outdoor lighting.  Clear any walking paths of anything that might make someone trip, such as rocks or tools.  Regularly check to see if handrails are loose  or broken. Make sure that both sides of any steps have handrails.  Any raised decks and porches should have guardrails on the edges.  Have any leaves, snow, or ice cleared regularly.  Use sand or salt on walking paths during winter.  Clean up any spills in your garage right away. This includes oil or grease spills. What can I do in the bathroom?  Use night lights.  Install grab bars by the toilet and in the tub and shower. Do not use towel bars as grab bars.  Use non-skid mats or decals in the tub or shower.  If you need to sit down in the shower, use a plastic, non-slip stool.  Keep the floor dry. Clean up any water that spills on the floor as soon as it happens.  Remove soap buildup in the tub or shower regularly.  Attach bath mats securely with double-sided non-slip rug tape.  Do not have throw rugs and other things on the floor that can make you trip. What can I do in the bedroom?  Use night lights.  Make sure that you have a light by your bed that is easy to reach.  Do not use any sheets or blankets that are too big  for your bed. They should not hang down onto the floor.  Have a firm chair that has side arms. You can use this for support while you get dressed.  Do not have throw rugs and other things on the floor that can make you trip. What can I do in the kitchen?  Clean up any spills right away.  Avoid walking on wet floors.  Keep items that you use a lot in easy-to-reach places.  If you need to reach something above you, use a strong step stool that has a grab bar.  Keep electrical cords out of the way.  Do not use floor polish or wax that makes floors slippery. If you must use wax, use non-skid floor wax.  Do not have throw rugs and other things on the floor that can make you trip. What can I do with my stairs?  Do not leave any items on the stairs.  Make sure that there are handrails on both sides of the stairs and use them. Fix handrails that are  broken or loose. Make sure that handrails are as long as the stairways.  Check any carpeting to make sure that it is firmly attached to the stairs. Fix any carpet that is loose or worn.  Avoid having throw rugs at the top or bottom of the stairs. If you do have throw rugs, attach them to the floor with carpet tape.  Make sure that you have a light switch at the top of the stairs and the bottom of the stairs. If you do not have them, ask someone to add them for you. What else can I do to help prevent falls?  Wear shoes that:  Do not have high heels.  Have rubber bottoms.  Are comfortable and fit you well.  Are closed at the toe. Do not wear sandals.  If you use a stepladder:  Make sure that it is fully opened. Do not climb a closed stepladder.  Make sure that both sides of the stepladder are locked into place.  Ask someone to hold it for you, if possible.  Clearly mark and make sure that you can see:  Any grab bars or handrails.  First and last steps.  Where the edge of each step is.  Use tools that help you move around (mobility aids) if they are needed. These include:  Canes.  Walkers.  Scooters.  Crutches.  Turn on the lights when you go into a dark area. Replace any light bulbs as soon as they burn out.  Set up your furniture so you have a clear path. Avoid moving your furniture around.  If any of your floors are uneven, fix them.  If there are any pets around you, be aware of where they are.  Review your medicines with your doctor. Some medicines can make you feel dizzy. This can increase your chance of falling. Ask your doctor what other things that you can do to help prevent falls. This information is not intended to replace advice given to you by your health care provider. Make sure you discuss any questions you have with your health care provider. Document Released: 12/17/2008 Document Revised: 07/29/2015 Document Reviewed: 03/27/2014 Elsevier  Interactive Patient Education  2017 [employer].

## 2020-04-05 ENCOUNTER — Encounter: Payer: Self-pay | Admitting: Internal Medicine

## 2020-04-05 DIAGNOSIS — E1042 Type 1 diabetes mellitus with diabetic polyneuropathy: Secondary | ICD-10-CM | POA: Diagnosis not present

## 2020-04-05 DIAGNOSIS — S32038D Other fracture of third lumbar vertebra, subsequent encounter for fracture with routine healing: Secondary | ICD-10-CM | POA: Diagnosis not present

## 2020-04-05 DIAGNOSIS — I10 Essential (primary) hypertension: Secondary | ICD-10-CM | POA: Diagnosis not present

## 2020-04-05 DIAGNOSIS — E538 Deficiency of other specified B group vitamins: Secondary | ICD-10-CM | POA: Diagnosis not present

## 2020-04-05 DIAGNOSIS — M5117 Intervertebral disc disorders with radiculopathy, lumbosacral region: Secondary | ICD-10-CM | POA: Diagnosis not present

## 2020-04-05 DIAGNOSIS — M47816 Spondylosis without myelopathy or radiculopathy, lumbar region: Secondary | ICD-10-CM | POA: Diagnosis not present

## 2020-04-06 ENCOUNTER — Other Ambulatory Visit: Payer: Self-pay | Admitting: Internal Medicine

## 2020-04-06 DIAGNOSIS — L659 Nonscarring hair loss, unspecified: Secondary | ICD-10-CM

## 2020-04-06 NOTE — Progress Notes (Signed)
Order placed for f/u labs.  

## 2020-04-07 ENCOUNTER — Other Ambulatory Visit: Payer: Self-pay

## 2020-04-07 ENCOUNTER — Other Ambulatory Visit (INDEPENDENT_AMBULATORY_CARE_PROVIDER_SITE_OTHER): Payer: Medicare Other

## 2020-04-07 DIAGNOSIS — Z7901 Long term (current) use of anticoagulants: Secondary | ICD-10-CM

## 2020-04-07 DIAGNOSIS — E1042 Type 1 diabetes mellitus with diabetic polyneuropathy: Secondary | ICD-10-CM | POA: Diagnosis not present

## 2020-04-07 DIAGNOSIS — L659 Nonscarring hair loss, unspecified: Secondary | ICD-10-CM

## 2020-04-07 DIAGNOSIS — E538 Deficiency of other specified B group vitamins: Secondary | ICD-10-CM | POA: Diagnosis not present

## 2020-04-07 DIAGNOSIS — I10 Essential (primary) hypertension: Secondary | ICD-10-CM | POA: Diagnosis not present

## 2020-04-07 DIAGNOSIS — M5117 Intervertebral disc disorders with radiculopathy, lumbosacral region: Secondary | ICD-10-CM | POA: Diagnosis not present

## 2020-04-07 DIAGNOSIS — S32038D Other fracture of third lumbar vertebra, subsequent encounter for fracture with routine healing: Secondary | ICD-10-CM | POA: Diagnosis not present

## 2020-04-07 DIAGNOSIS — M47816 Spondylosis without myelopathy or radiculopathy, lumbar region: Secondary | ICD-10-CM | POA: Diagnosis not present

## 2020-04-07 LAB — CBC WITH DIFFERENTIAL/PLATELET
Basophils Absolute: 0.1 10*3/uL (ref 0.0–0.1)
Basophils Relative: 0.7 % (ref 0.0–3.0)
Eosinophils Absolute: 0.1 10*3/uL (ref 0.0–0.7)
Eosinophils Relative: 1.3 % (ref 0.0–5.0)
HCT: 37 % (ref 36.0–46.0)
Hemoglobin: 12.2 g/dL (ref 12.0–15.0)
Lymphocytes Relative: 27.1 % (ref 12.0–46.0)
Lymphs Abs: 1.9 10*3/uL (ref 0.7–4.0)
MCHC: 32.9 g/dL (ref 30.0–36.0)
MCV: 91.1 fl (ref 78.0–100.0)
Monocytes Absolute: 1 10*3/uL (ref 0.1–1.0)
Monocytes Relative: 14.4 % — ABNORMAL HIGH (ref 3.0–12.0)
Neutro Abs: 4 10*3/uL (ref 1.4–7.7)
Neutrophils Relative %: 56.5 % (ref 43.0–77.0)
Platelets: 243 10*3/uL (ref 150.0–400.0)
RBC: 4.06 Mil/uL (ref 3.87–5.11)
RDW: 15.4 % (ref 11.5–15.5)
WBC: 7 10*3/uL (ref 4.0–10.5)

## 2020-04-07 LAB — BASIC METABOLIC PANEL
BUN: 10 mg/dL (ref 6–23)
CO2: 28 mEq/L (ref 19–32)
Calcium: 9.3 mg/dL (ref 8.4–10.5)
Chloride: 103 mEq/L (ref 96–112)
Creatinine, Ser: 0.77 mg/dL (ref 0.40–1.20)
GFR: 72.03 mL/min (ref >60.00)
Glucose, Bld: 109 mg/dL — ABNORMAL HIGH (ref 70–99)
Potassium: 4.1 mEq/L (ref 3.5–5.1)
Sodium: 138 mEq/L (ref 135–145)

## 2020-04-07 LAB — TSH: TSH: 3.29 u[IU]/mL (ref 0.35–4.50)

## 2020-04-07 LAB — PROTIME-INR
INR: 2.1 ratio — ABNORMAL HIGH (ref 0.8–1.0)
Prothrombin Time: 23.5 s — ABNORMAL HIGH (ref 9.6–13.1)

## 2020-04-08 DIAGNOSIS — M5117 Intervertebral disc disorders with radiculopathy, lumbosacral region: Secondary | ICD-10-CM | POA: Diagnosis not present

## 2020-04-08 DIAGNOSIS — I10 Essential (primary) hypertension: Secondary | ICD-10-CM | POA: Diagnosis not present

## 2020-04-08 DIAGNOSIS — M47816 Spondylosis without myelopathy or radiculopathy, lumbar region: Secondary | ICD-10-CM | POA: Diagnosis not present

## 2020-04-08 DIAGNOSIS — E538 Deficiency of other specified B group vitamins: Secondary | ICD-10-CM | POA: Diagnosis not present

## 2020-04-08 DIAGNOSIS — E1042 Type 1 diabetes mellitus with diabetic polyneuropathy: Secondary | ICD-10-CM | POA: Diagnosis not present

## 2020-04-08 DIAGNOSIS — S32038D Other fracture of third lumbar vertebra, subsequent encounter for fracture with routine healing: Secondary | ICD-10-CM | POA: Diagnosis not present

## 2020-04-13 DIAGNOSIS — E538 Deficiency of other specified B group vitamins: Secondary | ICD-10-CM | POA: Diagnosis not present

## 2020-04-13 DIAGNOSIS — S32038D Other fracture of third lumbar vertebra, subsequent encounter for fracture with routine healing: Secondary | ICD-10-CM | POA: Diagnosis not present

## 2020-04-13 DIAGNOSIS — E1042 Type 1 diabetes mellitus with diabetic polyneuropathy: Secondary | ICD-10-CM | POA: Diagnosis not present

## 2020-04-13 DIAGNOSIS — I10 Essential (primary) hypertension: Secondary | ICD-10-CM | POA: Diagnosis not present

## 2020-04-13 DIAGNOSIS — M47816 Spondylosis without myelopathy or radiculopathy, lumbar region: Secondary | ICD-10-CM | POA: Diagnosis not present

## 2020-04-13 DIAGNOSIS — M5117 Intervertebral disc disorders with radiculopathy, lumbosacral region: Secondary | ICD-10-CM | POA: Diagnosis not present

## 2020-04-14 ENCOUNTER — Encounter: Payer: Self-pay | Admitting: Internal Medicine

## 2020-04-14 DIAGNOSIS — E538 Deficiency of other specified B group vitamins: Secondary | ICD-10-CM | POA: Diagnosis not present

## 2020-04-14 DIAGNOSIS — E1042 Type 1 diabetes mellitus with diabetic polyneuropathy: Secondary | ICD-10-CM | POA: Diagnosis not present

## 2020-04-14 DIAGNOSIS — M5117 Intervertebral disc disorders with radiculopathy, lumbosacral region: Secondary | ICD-10-CM | POA: Diagnosis not present

## 2020-04-14 DIAGNOSIS — S32038D Other fracture of third lumbar vertebra, subsequent encounter for fracture with routine healing: Secondary | ICD-10-CM | POA: Diagnosis not present

## 2020-04-14 DIAGNOSIS — M47816 Spondylosis without myelopathy or radiculopathy, lumbar region: Secondary | ICD-10-CM | POA: Diagnosis not present

## 2020-04-14 DIAGNOSIS — I10 Essential (primary) hypertension: Secondary | ICD-10-CM | POA: Diagnosis not present

## 2020-04-14 NOTE — Telephone Encounter (Signed)
We had discussed getting stronger, etc prior to driving.  If agreeable, can schedule an appt to reassess and discuss.

## 2020-04-15 ENCOUNTER — Inpatient Hospital Stay
Admission: EM | Admit: 2020-04-15 | Discharge: 2020-04-19 | DRG: 536 | Disposition: A | Discharge status: Skilled Nursing Facility | Payer: Medicare Other | Attending: Internal Medicine | Admitting: Internal Medicine

## 2020-04-15 ENCOUNTER — Other Ambulatory Visit: Payer: Self-pay | Admitting: Internal Medicine

## 2020-04-15 ENCOUNTER — Emergency Department: Payer: Medicare Other

## 2020-04-15 ENCOUNTER — Other Ambulatory Visit: Payer: Self-pay | Admitting: *Deleted

## 2020-04-15 ENCOUNTER — Encounter: Payer: Self-pay | Admitting: Podiatry

## 2020-04-15 ENCOUNTER — Other Ambulatory Visit: Payer: Self-pay

## 2020-04-15 ENCOUNTER — Ambulatory Visit (INDEPENDENT_AMBULATORY_CARE_PROVIDER_SITE_OTHER): Payer: Medicare Other | Admitting: Podiatry

## 2020-04-15 DIAGNOSIS — Z043 Encounter for examination and observation following other accident: Secondary | ICD-10-CM | POA: Diagnosis not present

## 2020-04-15 DIAGNOSIS — M1990 Unspecified osteoarthritis, unspecified site: Secondary | ICD-10-CM | POA: Diagnosis present

## 2020-04-15 DIAGNOSIS — Z9221 Personal history of antineoplastic chemotherapy: Secondary | ICD-10-CM

## 2020-04-15 DIAGNOSIS — B351 Tinea unguium: Secondary | ICD-10-CM

## 2020-04-15 DIAGNOSIS — Z923 Personal history of irradiation: Secondary | ICD-10-CM

## 2020-04-15 DIAGNOSIS — R000 Tachycardia, unspecified: Secondary | ICD-10-CM | POA: Diagnosis not present

## 2020-04-15 DIAGNOSIS — Z853 Personal history of malignant neoplasm of breast: Secondary | ICD-10-CM

## 2020-04-15 DIAGNOSIS — Z8249 Family history of ischemic heart disease and other diseases of the circulatory system: Secondary | ICD-10-CM

## 2020-04-15 DIAGNOSIS — M25572 Pain in left ankle and joints of left foot: Secondary | ICD-10-CM | POA: Diagnosis not present

## 2020-04-15 DIAGNOSIS — Z880 Allergy status to penicillin: Secondary | ICD-10-CM

## 2020-04-15 DIAGNOSIS — M25551 Pain in right hip: Secondary | ICD-10-CM | POA: Diagnosis not present

## 2020-04-15 DIAGNOSIS — W010XXA Fall on same level from slipping, tripping and stumbling without subsequent striking against object, initial encounter: Secondary | ICD-10-CM | POA: Diagnosis present

## 2020-04-15 DIAGNOSIS — M79675 Pain in left toe(s): Secondary | ICD-10-CM

## 2020-04-15 DIAGNOSIS — M545 Low back pain, unspecified: Secondary | ICD-10-CM | POA: Diagnosis not present

## 2020-04-15 DIAGNOSIS — S32591A Other specified fracture of right pubis, initial encounter for closed fracture: Secondary | ICD-10-CM | POA: Diagnosis not present

## 2020-04-15 DIAGNOSIS — Z7901 Long term (current) use of anticoagulants: Secondary | ICD-10-CM

## 2020-04-15 DIAGNOSIS — W19XXXA Unspecified fall, initial encounter: Secondary | ICD-10-CM

## 2020-04-15 DIAGNOSIS — I4891 Unspecified atrial fibrillation: Secondary | ICD-10-CM | POA: Diagnosis present

## 2020-04-15 DIAGNOSIS — G8929 Other chronic pain: Secondary | ICD-10-CM | POA: Diagnosis present

## 2020-04-15 DIAGNOSIS — M79674 Pain in right toe(s): Secondary | ICD-10-CM

## 2020-04-15 DIAGNOSIS — Z86718 Personal history of other venous thrombosis and embolism: Secondary | ICD-10-CM

## 2020-04-15 DIAGNOSIS — H9190 Unspecified hearing loss, unspecified ear: Secondary | ICD-10-CM | POA: Diagnosis present

## 2020-04-15 DIAGNOSIS — I10 Essential (primary) hypertension: Secondary | ICD-10-CM | POA: Diagnosis not present

## 2020-04-15 DIAGNOSIS — Z66 Do not resuscitate: Secondary | ICD-10-CM | POA: Diagnosis not present

## 2020-04-15 DIAGNOSIS — Y92000 Kitchen of unspecified non-institutional (private) residence as  the place of occurrence of the external cause: Secondary | ICD-10-CM

## 2020-04-15 DIAGNOSIS — Z9181 History of falling: Secondary | ICD-10-CM

## 2020-04-15 DIAGNOSIS — Z86711 Personal history of pulmonary embolism: Secondary | ICD-10-CM

## 2020-04-15 DIAGNOSIS — R531 Weakness: Secondary | ICD-10-CM

## 2020-04-15 DIAGNOSIS — E1142 Type 2 diabetes mellitus with diabetic polyneuropathy: Secondary | ICD-10-CM | POA: Diagnosis present

## 2020-04-15 DIAGNOSIS — E785 Hyperlipidemia, unspecified: Secondary | ICD-10-CM | POA: Diagnosis present

## 2020-04-15 DIAGNOSIS — S32511A Fracture of superior rim of right pubis, initial encounter for closed fracture: Secondary | ICD-10-CM | POA: Diagnosis not present

## 2020-04-15 DIAGNOSIS — K219 Gastro-esophageal reflux disease without esophagitis: Secondary | ICD-10-CM | POA: Diagnosis present

## 2020-04-15 DIAGNOSIS — S32401A Unspecified fracture of right acetabulum, initial encounter for closed fracture: Secondary | ICD-10-CM | POA: Diagnosis not present

## 2020-04-15 DIAGNOSIS — S329XXA Fracture of unspecified parts of lumbosacral spine and pelvis, initial encounter for closed fracture: Secondary | ICD-10-CM | POA: Diagnosis present

## 2020-04-15 DIAGNOSIS — S0990XA Unspecified injury of head, initial encounter: Secondary | ICD-10-CM | POA: Diagnosis not present

## 2020-04-15 DIAGNOSIS — G319 Degenerative disease of nervous system, unspecified: Secondary | ICD-10-CM

## 2020-04-15 DIAGNOSIS — D689 Coagulation defect, unspecified: Secondary | ICD-10-CM

## 2020-04-15 DIAGNOSIS — Z20822 Contact with and (suspected) exposure to covid-19: Secondary | ICD-10-CM | POA: Diagnosis present

## 2020-04-15 DIAGNOSIS — Z79899 Other long term (current) drug therapy: Secondary | ICD-10-CM

## 2020-04-15 DIAGNOSIS — Z8711 Personal history of peptic ulcer disease: Secondary | ICD-10-CM

## 2020-04-15 DIAGNOSIS — S199XXA Unspecified injury of neck, initial encounter: Secondary | ICD-10-CM | POA: Diagnosis not present

## 2020-04-15 LAB — PROTIME-INR
INR: 2 — ABNORMAL HIGH (ref 0.8–1.2)
Prothrombin Time: 21.8 seconds — ABNORMAL HIGH (ref 11.4–15.2)

## 2020-04-15 LAB — CBC WITH DIFFERENTIAL/PLATELET
Abs Immature Granulocytes: 0.23 10*3/uL — ABNORMAL HIGH (ref 0.00–0.07)
Basophils Absolute: 0.1 10*3/uL (ref 0.0–0.1)
Basophils Relative: 0 %
Eosinophils Absolute: 0 10*3/uL (ref 0.0–0.5)
Eosinophils Relative: 0 %
HCT: 36.2 % (ref 36.0–46.0)
Hemoglobin: 12.1 g/dL (ref 12.0–15.0)
Immature Granulocytes: 2 %
Lymphocytes Relative: 13 %
Lymphs Abs: 1.5 10*3/uL (ref 0.7–4.0)
MCH: 30.5 pg (ref 26.0–34.0)
MCHC: 33.4 g/dL (ref 30.0–36.0)
MCV: 91.2 fL (ref 80.0–100.0)
Monocytes Absolute: 1.2 10*3/uL — ABNORMAL HIGH (ref 0.1–1.0)
Monocytes Relative: 11 %
Neutro Abs: 8.3 10*3/uL — ABNORMAL HIGH (ref 1.7–7.7)
Neutrophils Relative %: 74 %
Platelets: 234 10*3/uL (ref 150–400)
RBC: 3.97 MIL/uL (ref 3.87–5.11)
RDW: 15 % (ref 11.5–15.5)
WBC: 11.3 10*3/uL — ABNORMAL HIGH (ref 4.0–10.5)
nRBC: 0 % (ref 0.0–0.2)

## 2020-04-15 LAB — BASIC METABOLIC PANEL
Anion gap: 10 (ref 5–15)
BUN: 12 mg/dL (ref 8–23)
CO2: 22 mmol/L (ref 22–32)
Calcium: 9 mg/dL (ref 8.9–10.3)
Chloride: 105 mmol/L (ref 98–111)
Creatinine, Ser: 0.69 mg/dL (ref 0.44–1.00)
GFR, Estimated: >60 mL/min (ref >60)
Glucose, Bld: 138 mg/dL — ABNORMAL HIGH (ref 70–99)
Potassium: 3.8 mmol/L (ref 3.5–5.1)
Sodium: 137 mmol/L (ref 135–145)

## 2020-04-15 MED ORDER — ONDANSETRON HCL 4 MG/2ML IJ SOLN: 4.0000 mg | Freq: Once | INTRAMUSCULAR | Status: AC

## 2020-04-15 MED ORDER — AMLODIPINE BESYLATE 5 MG PO TABS
2.5000 mg | ORAL_TABLET | Freq: Every day | ORAL | Status: DC
Start: 2020-04-16 — End: 2020-04-19
  Administered 2020-04-16 – 2020-04-19 (×4): 2.5 mg via ORAL
  Filled 2020-04-15 (×4): qty 1

## 2020-04-15 MED ORDER — FERROUS SULFATE 325 (65 FE) MG PO TABS
325.0000 mg | ORAL_TABLET | Freq: Every day | ORAL | Status: DC
  Administered 2020-04-16 – 2020-04-18 (×3): 325 mg via ORAL
  Filled 2020-04-15 (×5): qty 1

## 2020-04-15 MED ORDER — MORPHINE SULFATE (PF) 2 MG/ML IV SOLN
2.0000 mg | Freq: Once | INTRAVENOUS | Status: AC
  Administered 2020-04-15: 2 mg via INTRAVENOUS
  Filled 2020-04-15: qty 1

## 2020-04-15 MED ORDER — TRAMADOL HCL 50 MG PO TABS
50.0000 mg | ORAL_TABLET | Freq: Four times a day (QID) | ORAL | Status: AC | PRN
  Administered 2020-04-15 – 2020-04-17 (×5): 50 mg via ORAL
  Filled 2020-04-15 (×5): qty 1

## 2020-04-15 MED ORDER — ONDANSETRON HCL 4 MG/2ML IJ SOLN
INTRAMUSCULAR | Status: AC
  Administered 2020-04-15: 4 mg via INTRAVENOUS
  Filled 2020-04-15: qty 2

## 2020-04-15 MED ORDER — ACETAMINOPHEN 325 MG PO TABS
325.0000 mg | ORAL_TABLET | Freq: Four times a day (QID) | ORAL | Status: AC | PRN
  Administered 2020-04-16 – 2020-04-18 (×4): 325 mg via ORAL
  Filled 2020-04-15 (×4): qty 1

## 2020-04-15 MED ORDER — PANTOPRAZOLE SODIUM 20 MG PO TBEC
20.0000 mg | DELAYED_RELEASE_TABLET | Freq: Every day | ORAL | Status: DC
Start: 2020-04-16 — End: 2020-04-19
  Administered 2020-04-16 – 2020-04-19 (×4): 20 mg via ORAL
  Filled 2020-04-15 (×5): qty 1

## 2020-04-15 MED ORDER — ACETAMINOPHEN 650 MG RE SUPP: 325.0000 mg | Freq: Four times a day (QID) | RECTAL | Status: AC | PRN

## 2020-04-15 MED ORDER — WARFARIN - PHARMACIST DOSING INPATIENT
Freq: Every day | Status: DC
  Filled 2020-04-15: qty 1

## 2020-04-15 MED ORDER — WARFARIN SODIUM 2.5 MG PO TABS: 2.5000 mg | ORAL_TABLET | Freq: Every day | ORAL | Status: DC

## 2020-04-15 MED ORDER — LOSARTAN POTASSIUM 50 MG PO TABS
100.0000 mg | ORAL_TABLET | Freq: Every day | ORAL | Status: DC
  Administered 2020-04-16 – 2020-04-19 (×4): 100 mg via ORAL
  Filled 2020-04-15 (×4): qty 2

## 2020-04-15 MED ORDER — MORPHINE SULFATE (PF) 2 MG/ML IV SOLN
2.0000 mg | INTRAVENOUS | Status: DC | PRN
  Administered 2020-04-16 (×2): 2 mg via INTRAVENOUS
  Filled 2020-04-15 (×2): qty 1

## 2020-04-15 MED ORDER — ADULT MULTIVITAMIN W/MINERALS CH
1.0000 | ORAL_TABLET | Freq: Every day | ORAL | Status: DC
  Administered 2020-04-16 – 2020-04-19 (×4): 1 via ORAL
  Filled 2020-04-15 (×4): qty 1

## 2020-04-15 MED ORDER — ONDANSETRON HCL 4 MG/2ML IJ SOLN
4.0000 mg | Freq: Four times a day (QID) | INTRAMUSCULAR | Status: AC | PRN
  Administered 2020-04-16: 4 mg via INTRAVENOUS
  Filled 2020-04-15 (×2): qty 2

## 2020-04-15 MED ORDER — WARFARIN SODIUM 4 MG PO TABS
4.0000 mg | ORAL_TABLET | Freq: Once | ORAL | Status: DC
  Filled 2020-04-15 (×2): qty 1

## 2020-04-15 MED ORDER — ONDANSETRON HCL 4 MG PO TABS
4.0000 mg | ORAL_TABLET | Freq: Four times a day (QID) | ORAL | Status: AC | PRN
  Administered 2020-04-17 – 2020-04-18 (×2): 4 mg via ORAL
  Filled 2020-04-15 (×2): qty 1

## 2020-04-15 MED ORDER — SIMVASTATIN 20 MG PO TABS
10.0000 mg | ORAL_TABLET | Freq: Every day | ORAL | Status: DC
  Administered 2020-04-16 – 2020-04-18 (×4): 10 mg via ORAL
  Filled 2020-04-15 (×4): qty 1

## 2020-04-15 MED ORDER — MORPHINE SULFATE (PF) 4 MG/ML IV SOLN
4.0000 mg | Freq: Once | INTRAVENOUS | Status: AC
Start: 2020-04-15 — End: 2020-04-15
  Administered 2020-04-15: 4 mg via INTRAVENOUS
  Filled 2020-04-15: qty 1

## 2020-04-15 NOTE — ED Triage Notes (Signed)
Patient reports she does take blood thinners. Patient is Ax0 X4.

## 2020-04-15 NOTE — ED Notes (Signed)
Admitting provider at bedside.

## 2020-04-15 NOTE — ED Notes (Signed)
Patient transported to X-ray 

## 2020-04-15 NOTE — ED Provider Notes (Signed)
[facility] Emergency Department Provider Note   ____________________________________________   Event Date/Time   First MD Initiated Contact with Patient 04/15/20 1755     (approximate)  I have reviewed the triage vital signs and the nursing notes.   HISTORY  Chief Complaint Hip Pain    HPI Anita Walker is a 80 y.o. female with past medical history of hypertension, hyperlipidemia, diabetes, atrial fibrillation on Coumadin, PE, and chronic back pain who presents to the ED complaining of hip pain.  Patient reports that she was using her walker in her kitchen just prior to arrival when she attempted to turn around, but lost her balance and fell onto her backside.  She is not sure whether she hit her head, but denies losing consciousness.  She complains of significant pain in her right hip and lower back, has been unable to stand up and walk since the fall.  She was feeling well prior to the fall, denies any recent fevers, cough, chest pain, or shortness of breath.        Past Medical History:  Diagnosis Date  . Anemia   . Arthritis   . Atrial fibrillation ([place])   . Breast cancer ([place])    s/p lumpectomy 1992.  s/p chemo and xrt left breast  . Diabetes mellitus ([place])   . Edema    feet/legs  . Gastric ulcer   . GERD (gastroesophageal reflux disease)   . HOH (hard of hearing)    aides  . Hypercholesterolemia   . Hypertension   . Personal history of chemotherapy   . Pulmonary emboli [facility])     Patient Active Problem List   Diagnosis Date Noted  . Cerebral atrophy ([place]) 03/20/2020  . Chronic low back pain ([place]) w/o sciatica 01/26/2020  . 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 Grade 1 Retrolisthesis of L3/L4 12/18/2019  . Lip lesion 12/14/2019  . Acute exacerbation of chronic low back pain 12/11/2019  . At high risk for falls  12/11/2019  . Fall at home, sequela 12/11/2019  . Head injury 12/02/2019  . Bradycardia following surgery 11/18/2019  . Wrist fracture 11/02/2019  . Weakness 11/02/2019  . Change in bowel movement 11/02/2019  . DDD (degenerative disc disease), lumbosacral 09/18/2019  . Irritability and anger 09/18/2019  . Nausea without vomiting 09/18/2019  . History of vasovagal symptom 08/14/2019    Class: History of  . Polyp of descending colon   . Positive colorectal cancer screening using Cologuard test 05/25/2019  . Spondylosis without myelopathy or radiculopathy, lumbosacral region 03/11/2019  . 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 Compression fracture of thoracolumbar vertebra (T11), sequela 02/18/2019  . Lumbar facet arthropathy ([place]) 02/18/2019  . 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 L5-S1 disc-osteophyte complex (Right) 02/18/2019  . Lumbar facet syndrome (Bilateral) (R>L) 02/18/2019  . Chronic anticoagulation (Coumadin) 02/18/2019  . Chronic low back pain (Bilateral) (R>L) w/o sciatica 02/18/2019  . Chronic pain syndrome 02/18/2019  . Compression fracture of thoracic vertebra ([place]) 10/12/2018  . Pain due to onychomycosis of toenails of both feet 09/02/2018  . Coagulation disorder ([place]) 09/02/2018  . Constipation 08/18/2018  . Fall 08/18/2018  . Diabetic polyneuropathy associated with type 2 diabetes mellitus ([place]) 03/18/2017  . Abnormal chest CT 03/18/2017  . Urinary frequency 01/21/2017  . Syncope 11/10/2016  .  Right hip pain 10/30/2016  . Cough 10/25/2016  . Health care maintenance 05/23/2014  . Mixed emotional features as adjustment reaction 07/20/2013  . Long term current use of anticoagulant therapy 04/11/2013  . Hypertension 01/01/2012  . Hypercholesteremia 01/01/2012  . History of breast cancer 01/01/2012  . Essential hypertension 01/01/2012  . Pure hypercholesterolemia  01/01/2012  . Type 2 diabetes mellitus without complications ([place]) 65/05/5463    Past Surgical History:  Procedure Laterality Date  . BREAST LUMPECTOMY  03/06/1990   left breast  . CATARACT EXTRACTION W/PHACO Right 12/26/2016   Procedure: CATARACT EXTRACTION PHACO AND INTRAOCULAR LENS PLACEMENT (IOC)-RIGHT DIABETIC;  Surgeon: Birder Robson, MD;  Location: [facility] ORS;  Service: Ophthalmology;  Laterality: Right;  [place] 00:47 AP% 24.5 CDE 11.62 Fluid pack lot # [id number]  . CATARACT EXTRACTION W/PHACO Left 01/23/2017   Procedure: CATARACT EXTRACTION PHACO AND INTRAOCULAR LENS PLACEMENT (IOC);  Surgeon: Birder Robson, MD;  Location: [facility] ORS;  Service: Ophthalmology;  Laterality: Left;  [place] 00:50 AP% 16.1 CDE 8.18 Fluid pack lot #[id number]  . COLONOSCOPY WITH PROPOFOL N/A 07/08/2019   Procedure: COLONOSCOPY WITH PROPOFOL;  Surgeon: Lucilla Lame, MD;  Location: [facility] ENDOSCOPY;  Service: Endoscopy;  Laterality: N/A;    Prior to Admission medications   Medication Sig Start Date End Date Taking? Authorizing Provider  amLODipine (NORVASC) 2.5 MG tablet TAKE 1 TABLET(2.5 MG) BY MOUTH DAILY 03/01/20   Einar Pheasant, MD  diphenhydrAMINE (BENADRYL) 25 mg capsule Take 25 mg at bedtime as needed by mouth for sleep.     [provider]  FEROSUL 325 (65 Fe) MG tablet TAKE 1 TABLET(325 MG) BY MOUTH DAILY 10/17/19   Einar Pheasant, MD  glucose blood (CONTOUR TEST) test strip USE TO TEST BLOOD SUGAR ONCE EVERY WEEK 04/01/20   Einar Pheasant, MD  hydrocortisone 2.5 % ointment Apply topically. 01/22/20   [provider]  losartan (COZAAR) 100 MG tablet TAKE 1 TABLET(100 MG) BY MOUTH DAILY 04/15/20   Einar Pheasant, MD  Multiple Vitamin (MULTIVITAMIN WITH MINERALS) TABS tablet Take 1 tablet daily by mouth.    [provider]  pantoprazole (PROTONIX) 40 MG tablet TAKE 1 TABLET(40 MG) BY MOUTH DAILY 04/15/20   Einar Pheasant, MD  simvastatin (ZOCOR) 10 MG tablet TAKE 1 TABLET BY  MOUTH EVERY DAY 01/19/20   Einar Pheasant, MD  warfarin (COUMADIN) 2.5 MG tablet Take 2.5 mg by mouth. 12/26/19   [provider]    Allergies Penicillins and Penicillin v potassium  Family History  Problem Relation Age of Onset  . Heart disease Father   . Heart disease Brother        s/p CABG  . Breast cancer Daughter   . Colon cancer Neg Hx     Social History Social History   Tobacco Use  . Smoking status: Never Smoker  . Smokeless tobacco: Never Used  Vaping Use  . Vaping Use: Never used  Substance Use Topics  . Alcohol use: No    Alcohol/week: 0.0 standard drinks  . Drug use: No    Review of Systems  Constitutional: No fever/chills Eyes: No visual changes. ENT: No sore throat. Cardiovascular: Denies chest pain. Respiratory: Denies shortness of breath. Gastrointestinal: No abdominal pain.  No nausea, no vomiting.  No diarrhea.  No constipation. Genitourinary: Negative for dysuria. Musculoskeletal: Positive for back and right hip pain. Skin: Negative for rash. Neurological: Negative for headaches, focal weakness or numbness.  ____________________________________________   PHYSICAL EXAM:  VITAL SIGNS: ED Triage Vitals  Enc Vitals Group     BP      Pulse      Resp      Temp      Temp src      SpO2      Weight      Height      Head Circumference      Peak Flow      Pain Score      Pain Loc      Pain Edu?      Excl. in [place]?     Constitutional: Alert and oriented. Eyes: Conjunctivae are normal. Head: Atraumatic. Nose: No congestion/rhinnorhea. Mouth/Throat: Mucous membranes are moist. Neck: Normal ROM, no midline cervical spine tenderness. Cardiovascular: Normal rate, regular rhythm. Grossly normal heart sounds.  2+ DP pulses bilaterally. Respiratory: Normal respiratory effort.  No retractions. Lungs CTAB. Gastrointestinal: Soft and nontender. No distention. Genitourinary: deferred Musculoskeletal: Diffuse tenderness noted at right  hip and midline lumbar spine.  No tenderness noted at left hip, bilateral knees, bilateral ankles.  No upper extremity bony tenderness noted.  No obvious deformities noted. Neurologic:  Normal speech and language. No gross focal neurologic deficits are appreciated. Skin:  Skin is warm, dry and intact. No rash noted. Psychiatric: Mood and affect are normal. Speech and behavior are normal.  ____________________________________________   LABS (all labs ordered are listed, but only abnormal results are displayed)  Labs Reviewed  CBC WITH DIFFERENTIAL/PLATELET - Abnormal; Notable for the following components:      Result Value   WBC 11.3 (*)    Neutro Abs 8.3 (*)    Monocytes Absolute 1.2 (*)    Abs Immature Granulocytes 0.23 (*)    All other components within normal limits  PROTIME-INR - Abnormal; Notable for the following components:   Prothrombin Time 21.8 (*)    INR 2.0 (*)    All other components within normal limits  BASIC METABOLIC PANEL - Abnormal; Notable for the following components:   Glucose, Bld 138 (*)    All other components within normal limits  SARS CORONAVIRUS 2 (TAT 6-24 HRS)   ____________________________________________  EKG  ED ECG REPORT I, Blake Divine, the attending physician, personally viewed and interpreted this ECG.   Date: 04/15/2020  EKG Time: 20:25  Rate: 109  Rhythm: sinus tachycardia  Axis: Normal  Intervals:none  ST&T Change: None   PROCEDURES  Procedure(s) performed (including Critical Care):  Procedures   ____________________________________________   INITIAL IMPRESSION / ASSESSMENT AND PLAN / ED COURSE       80 year old female with past medical history of hypertension, hyperlipidemia, diabetes, atrial fibrillation on Coumadin, PE, and chronic back pain who presents to the ED following episode where she lost balance at home while using her walker and fell to the ground, striking her right hip.  She is unsure whether she hit  her head and given she is anticoagulated we will check CT head and C-spine.  She does have tenderness at her right hip but no obvious deformity, plan to check x-rays of lumbar spine and right hip.  She is neurovascularly intact to her bilateral lower extremities.  We will treat pain with IV morphine, screen EKG and labs.  X-rays of lumbar spine and right hip reviewed by me, no evidence of hip fracture but patient does have right-sided pubic rami fractures.  Lumbar spine x-ray is negative for acute process, CT head and neck are also unremarkable.  Labs are unremarkable, INR within therapeutic range.  Patient continues  to have significant pain and inability to ambulate with these pubic rami fractures.  She currently lives alone and would benefit from admission for pain control and physical therapy with likely rehab placement.  Patient and daughter agree with plan.      ____________________________________________   FINAL CLINICAL IMPRESSION(S) / ED DIAGNOSES  Final diagnoses:  Fall, initial encounter  Closed fracture of multiple pubic rami, right, initial encounter [facility])     ED Discharge Orders    None       Note:  This document was prepared using Dragon voice recognition software and may include unintentional dictation errors.   Blake Divine, MD 04/15/20 2132

## 2020-04-15 NOTE — Progress Notes (Signed)
This patient returns to my office for at risk foot care.  This patient requires this care by a professional since this patient will be at risk due to having type 2 diabetes and coagulation defect   Patient is taking coumadin. This patient is unable to cut nails herself since the patient cannot reach her nails.These nails are painful walking and wearing shoes.  This patient presents for at risk foot care today.  General Appearance  Alert, conversant and in no acute stress.  Vascular  Dorsalis pedis and posterior tibial  pulses are palpable  bilaterally.  Capillary return is within normal limits  bilaterally. Temperature is within normal limits  bilaterally.  Neurologic  Senn-Weinstein monofilament wire test diminished   bilaterally. Muscle power within normal limits bilaterally.  Nails Thick disfigured discolored nails with subungual debris  from hallux to fifth toes bilaterally. No evidence of bacterial infection or drainage bilaterally.  Orthopedic  No limitations of motion  feet .  No crepitus or effusions noted.  No bony pathology or digital deformities noted.  Skin  normotropic skin with no porokeratosis noted bilaterally.  No signs of infections or ulcers noted.     Onychomycosis  Pain in right toes  Pain in left toes  Consent was obtained for treatment procedures.   Mechanical debridement of nails 1-5  bilaterally performed with a nail nipper.  Filed with dremel without incident.    Return office visit    10 weeks                  Told patient to return for periodic foot care and evaluation due to potential at risk complications.   Gardiner Barefoot DPM

## 2020-04-15 NOTE — H&P (Addendum)
History and Physical   Anita Walker ELF:[id number] DOB: [birth year] DOA: 04/15/2020  PCP: Einar Pheasant, MD  Outpatient Specialists: Dr. Rudene Christians outpatient Patient coming from: Home  I have personally briefly reviewed patient's old medical records in [place].  Chief Concern: Fall  HPI: Anita Walker is a 80 y.o. female with medical history significant for hypertension, at risk for falls, hyperlipidemia, non-insulin-dependent diabetes mellitus, diabetic polyneuropathy, history of left breast cancer status post lumpectomy and radiation and hormonal therapy approximately 30 years ago, DVT of the lower extremity, pulmonary embolism, on chronic anticoagulation with Coumadin, presents to the emergency department for chief concerns of a fall.  Patient was in the kitchen using her walker when she turned around lost her balance and fell.  She endorses bumping into a cabinet.  She denies head trauma and loss of consciousness.  Before Eddie Dibbles falling she reports that she did not experience any shortness of breath, chest pain, headaches, abdominal pain, dizziness, lightheadedness, vision changes.  She endorses right hip pain that is improved with IV pain medications in the emergency department.  She reports the pain was throbbing aching and persistent prior to IV pain medication.  She reports initially the pain was 10 out of 10.    ROS: Constitutional: no weight change, no fever ENT/Mouth: no sore throat, no rhinorrhea Eyes: no eye pain, no vision changes Cardiovascular: no chest pain, no dyspnea,  no edema, no palpitations Respiratory: no cough, no sputum, no wheezing Gastrointestinal: no nausea, no vomiting, no diarrhea, no constipation Genitourinary: no urinary incontinence, no dysuria, no hematuria Musculoskeletal: Right hip pain.  Reduced range of motion of the right lower extremity. Skin: no skin lesions, no pruritus, Neuro: + weakness, no loss of consciousness, no syncope Psych: no  anxiety, no depression, + decrease appetite Heme/Lymph: no bruising, no bleeding  ED Course: Discussed with ED provider, patient requiring hospitalization due to fall with a right superior and inferior pubic rami fracture requiring pain medication.  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 daily, losartan 100 mg p.o. daily resumed  Hyperlipidemia-simvastatin 10 mg nightly  History of DVT and pulmonary embolism -resumed home warfarin 2.5 mg daily -Warfarin per pharmacy  GERD-resumed home PPI  Chart reviewed.   Patient on anticoagulation due to history of DVT and pulmonary embolism per daughter. Per daughter, patient has a history of left breast cancer status post lumpectomy, radiation, hormonal therapy for 5 years. Patient was diagnosed with breast cancer about 30 years ago and has been discharged from regular oncology follow-up.  DVT prophylaxis: Warfarin Code Status: DNR Diet: Heart healthy Family Communication: discussed with daughter at bedside, Cala Bradford Disposition Plan: Pending clinical course and PT evaluation Consults called: TOC for SNF placement, PT, OT Admission status: Observation with telemetry  Past Medical History:  Diagnosis Date  . Anemia   . Arthritis   . Atrial fibrillation (  [place])   . Breast cancer ([place])    s/p  lumpectomy 1992.  s/p chemo and xrt left breast  . Diabetes mellitus ([place])   . Edema    feet/legs  . Gastric ulcer   . GERD (gastroesophageal reflux disease)   . HOH (hard of hearing)    aides  . Hypercholesterolemia   . Hypertension   . Personal history of chemotherapy   . Pulmonary emboli [facility])    Past Surgical History:  Procedure Laterality Date  . BREAST LUMPECTOMY  03/06/1990   left breast  . CATARACT EXTRACTION W/PHACO Right 12/26/2016   Procedure: CATARACT EXTRACTION PHACO AND INTRAOCULAR LENS PLACEMENT (IOC)-RIGHT DIABETIC;  Surgeon: Birder Robson, MD;  Location: [facility] ORS;  Service: Ophthalmology;  Laterality: Right;  [place] 00:47 AP% 24.5 CDE 11.62 Fluid pack lot # [id number]  . CATARACT EXTRACTION W/PHACO Left 01/23/2017   Procedure: CATARACT EXTRACTION PHACO AND INTRAOCULAR LENS PLACEMENT (IOC);  Surgeon: Birder Robson, MD;  Location: [facility] ORS;  Service: Ophthalmology;  Laterality: Left;  [place] 00:50 AP% 16.1 CDE 8.18 Fluid pack lot #[id number]  . COLONOSCOPY WITH PROPOFOL N/A 07/08/2019   Procedure: COLONOSCOPY WITH PROPOFOL;  Surgeon: Lucilla Lame, MD;  Location: [facility] ENDOSCOPY;  Service: Endoscopy;  Laterality: N/A;   Social History:  reports that she has never smoked. She has never used smokeless tobacco. She reports that she does not drink alcohol and does not use drugs.  Allergies  Allergen Reactions  . Penicillins Other (See Comments)    Unknown- pt states been a long time ago  Has patient had a PCN reaction causing immediate rash, facial/tongue/throat swelling, SOB or lightheadedness with hypotension: Unknown Has patient had a PCN reaction causing severe rash involving mucus membranes or skin necrosis: Unknown Has patient had a PCN reaction that required hospitalization: Unknown Has patient had a PCN reaction occurring within the last 10 years: Unknown If all of the above answers are "NO", then may proceed with Cephalosporin use.  Marland Kitchen Penicillin V Potassium Nausea  And Vomiting   Family History  Problem Relation Age of Onset  . Heart disease Father   . Heart disease Brother        s/p CABG  . Breast cancer Daughter   . Colon cancer Neg Hx    Family history: Family history reviewed and not pertinent  Prior to Admission medications   Medication Sig Start Date End Date Taking? Authorizing Provider  amLODipine (NORVASC) 2.5 MG tablet TAKE 1 TABLET(2.5 MG) BY MOUTH DAILY 03/01/20   Einar Pheasant, MD  diphenhydrAMINE (BENADRYL) 25 mg capsule Take 25 mg at bedtime as needed by mouth for sleep.     [provider]  FEROSUL 325 (65 Fe) MG tablet TAKE 1 TABLET(325 MG) BY MOUTH DAILY 10/17/19   Einar Pheasant, MD  glucose blood (CONTOUR TEST) test strip USE TO TEST BLOOD SUGAR ONCE EVERY WEEK 04/01/20   Einar Pheasant, MD  hydrocortisone 2.5 % ointment Apply topically. 01/22/20   [provider]  losartan (COZAAR) 100 MG tablet TAKE 1 TABLET(100 MG) BY MOUTH DAILY 04/15/20   Einar Pheasant, MD  Multiple Vitamin (MULTIVITAMIN WITH MINERALS) TABS tablet Take 1 tablet daily by mouth.    [provider]  pantoprazole (PROTONIX) 40 MG tablet TAKE 1 TABLET(40 MG) BY MOUTH DAILY 04/15/20   Einar Pheasant, MD  simvastatin (ZOCOR) 10 MG tablet TAKE 1 TABLET BY MOUTH EVERY DAY 01/19/20   Einar Pheasant, MD  warfarin (COUMADIN) 2.5 MG tablet Take 2.5 mg by mouth.  12/26/19   [provider]   Physical Exam: Vitals:   04/15/20 2038 04/15/20 2100 04/15/20 2130 04/15/20 2200  BP: 132/77 128/77 133/72 128/87  Pulse: (!) 106 (!) 106 (!) 107 (!) 108  Resp: 19 15 17 18   Temp:      TempSrc:      SpO2: 96% 97% 98% 97%  Weight:      Height:       Constitutional: appears age-appropriate, NAD, calm, comfortable Eyes: PERRL, lids and conjunctivae normal ENMT: Mucous membranes are moist. Posterior pharynx clear of any exudate or lesions.  Multiple teeth loss no dentures in place.  Hearing loss present Neck: normal, supple, no masses,  no thyromegaly Respiratory: clear to auscultation bilaterally, no wheezing, no crackles. Normal respiratory effort. No accessory muscle use.  Cardiovascular: Regular rate and rhythm, no murmurs / rubs / gallops. No extremity edema. 2+ pedal pulses. No carotid bruits.  Abdomen: Morbidly obese abdomen, no tenderness, no masses palpated, no hepatosplenomegaly. Bowel sounds positive.  Musculoskeletal: no clubbing / cyanosis. No joint deformity upper and lower extremities. Good ROM, no contractures, no atrophy. Normal muscle tone.  Skin: no rashes, lesions, ulcers. No induration Neurologic: Sensation intact. Strength 5/5 in all 4.  Psychiatric: Normal judgment and insight. Alert and oriented x 3. Normal mood.   EKG: independently reviewed, showing sinus tachycardia with rate of 109, QTc 495, LVH  Chest x-ray on Admission: I personally reviewed and I agree with radiologist reading as below.  DG Lumbar Spine 2-3 Views  Result Date: 04/15/2020 CLINICAL DATA:  Fall EXAM: LUMBAR SPINE - 2-3 VIEW COMPARISON:  12/11/2019 FINDINGS: Moderate compression fracture involving, stable since prior the L3 vertebral body study. Diffuse degenerative disc and facet disease. No acute fracture. Normal alignment. IMPRESSION: Stable chronic L3 compression fracture. Spondylosis. No acute findings. Electronically Signed   By: Rolm Baptise M.D.   On: 04/15/2020 19:02   CT Head Wo Contrast  Result Date: 04/15/2020 CLINICAL DATA:  Fall, landing on her bottom. Denies hitting her head. No neck pain. EXAM: CT HEAD WITHOUT CONTRAST CT CERVICAL SPINE WITHOUT CONTRAST TECHNIQUE: Multidetector CT imaging of the head and cervical spine was performed following the standard protocol without intravenous contrast. Multiplanar CT image reconstructions of the cervical spine were also generated. COMPARISON:  CT head and cervical spine dated March 19, 2020. FINDINGS: CT HEAD FINDINGS Brain: No evidence of acute infarction, hemorrhage,  hydrocephalus, extra-axial collection or mass lesion/mass effect. Stable atrophy and chronic microvascular ischemic changes. Vascular: Calcified atherosclerosis at the skullbase. No hyperdense vessel. Skull: Normal. Negative for fracture or focal lesion. Sinuses/Orbits: No acute finding. Other: None. CT CERVICAL SPINE FINDINGS Alignment: No traumatic malalignment. Unchanged trace anterolisthesis at C3-C4 and C4-C5. Skull base and vertebrae: No acute fracture. No primary bone lesion or focal pathologic process. Soft tissues and spinal canal: No prevertebral fluid or swelling. No visible canal hematoma. Disc levels: Unchanged moderate disc height loss and uncovertebral hypertrophy at C5-C6 and C6-C7. Unchanged diffuse facet arthropathy, advanced on the left at C3-C4 and C4-C5. Upper chest: Negative. Other: 1.3 cm hypodense nodule in the right thyroid gland. Not clinically significant; no follow-up imaging recommended. IMPRESSION: 1. No acute intracranial abnormality. Stable atrophy and chronic microvascular ischemic changes. 2. No acute cervical spine fracture or traumatic listhesis. 3. Unchanged moderate cervical spondylosis. Electronically Signed   By: Titus Dubin M.D.   On: 04/15/2020 18:34   CT Cervical Spine Wo Contrast  Result Date: 04/15/2020 CLINICAL DATA:  Fall, landing on her bottom.  Denies hitting her head. No neck pain. EXAM: CT HEAD WITHOUT CONTRAST CT CERVICAL SPINE WITHOUT CONTRAST TECHNIQUE: Multidetector CT imaging of the head and cervical spine was performed following the standard protocol without intravenous contrast. Multiplanar CT image reconstructions of the cervical spine were also generated. COMPARISON:  CT head and cervical spine dated March 19, 2020. FINDINGS: CT HEAD FINDINGS Brain: No evidence of acute infarction, hemorrhage, hydrocephalus, extra-axial collection or mass lesion/mass effect. Stable atrophy and chronic microvascular ischemic changes. Vascular: Calcified  atherosclerosis at the skullbase. No hyperdense vessel. Skull: Normal. Negative for fracture or focal lesion. Sinuses/Orbits: No acute finding. Other: None. CT CERVICAL SPINE FINDINGS Alignment: No traumatic malalignment. Unchanged trace anterolisthesis at C3-C4 and C4-C5. Skull base and vertebrae: No acute fracture. No primary bone lesion or focal pathologic process. Soft tissues and spinal canal: No prevertebral fluid or swelling. No visible canal hematoma. Disc levels: Unchanged moderate disc height loss and uncovertebral hypertrophy at C5-C6 and C6-C7. Unchanged diffuse facet arthropathy, advanced on the left at C3-C4 and C4-C5. Upper chest: Negative. Other: 1.3 cm hypodense nodule in the right thyroid gland. Not clinically significant; no follow-up imaging recommended. IMPRESSION: 1. No acute intracranial abnormality. Stable atrophy and chronic microvascular ischemic changes. 2. No acute cervical spine fracture or traumatic listhesis. 3. Unchanged moderate cervical spondylosis. Electronically Signed   By: Titus Dubin M.D.   On: 04/15/2020 18:34   DG Hip Unilat W or Wo Pelvis 2-3 Views Right  Result Date: 04/15/2020 CLINICAL DATA:  Fall EXAM: DG HIP (WITH OR WITHOUT PELVIS) 2-3V RIGHT COMPARISON:  None. FINDINGS: Fracture through the right superior and inferior pubic rami. The superior pubic ramus fracture is near the medial wall of the right acetabulum. No proximal femoral fracture, subluxation or dislocation. Mild degenerative changes in the hips. IMPRESSION: Right superior and inferior pubic rami fractures. Electronically Signed   By: Rolm Baptise M.D.   On: 04/15/2020 19:03   Labs on Admission: I have personally reviewed following labs  CBC: Recent Labs  Lab 04/15/20 1915  WBC 11.3*  NEUTROABS 8.3*  HGB 12.1  HCT 36.2  MCV 91.2  PLT 932   Basic Metabolic Panel: Recent Labs  Lab 04/15/20 2020  NA 137  K 3.8  CL 105  CO2 22  GLUCOSE 138*  BUN 12  CREATININE 0.69  CALCIUM 9.0    GFR: Estimated Creatinine Clearance: 67.4 mL/min (by C-G formula based on SCr of 0.69 mg/dL).  Coagulation Profile: Recent Labs  Lab 04/15/20 1915  INR 2.0*   Urine analysis:    Component Value Date/Time   COLORURINE YELLOW (A) 08/08/2018 0122   APPEARANCEUR Cloudy (A) 02/05/2020 1605   LABSPEC 1.019 08/08/2018 0122   LABSPEC 1.008 07/16/2013 1405   PHURINE 5.0 08/08/2018 0122   GLUCOSEU Negative 02/05/2020 1605   GLUCOSEU Negative 07/16/2013 1405   HGBUR NEGATIVE 08/08/2018 0122   BILIRUBINUR Negative 02/05/2020 1605   BILIRUBINUR Negative 07/16/2013 1405   KETONESUR 5 (A) 08/08/2018 0122   PROTEINUR Negative 02/05/2020 1605   PROTEINUR NEGATIVE 08/08/2018 0122   UROBILINOGEN 0.2 07/18/2013 0948   NITRITE Negative 02/05/2020 1605   NITRITE NEGATIVE 08/08/2018 0122   LEUKOCYTESUR 2+ (A) 02/05/2020 1605   LEUKOCYTESUR NEGATIVE 08/08/2018 0122   LEUKOCYTESUR 3+ 07/16/2013 1405   Zanyah Lentsch N Massimiliano Rohleder D.O. [facility]  If 7PM-7AM, please contact overnight-coverage provider If 7AM-7PM, please contact day coverage provider [email or web address]  04/15/2020, 10:20 PM

## 2020-04-15 NOTE — ED Notes (Signed)
Patient continues to be off the floor in imaging at this time.

## 2020-04-15 NOTE — ED Notes (Signed)
Patient transported to CT 

## 2020-04-15 NOTE — ED Triage Notes (Signed)
Patient arrives via EMS with complaints of right hip pain after losing her balance in her home. Patient states she landed on her "butt" after falling, on her right side. Patient denies hitting her head, LOC, or loss of bowel or bladder control. Patient denies neck pain. Patient reports significant swelling to BLE is baseline for her.

## 2020-04-16 ENCOUNTER — Encounter: Payer: Self-pay | Admitting: Internal Medicine

## 2020-04-16 DIAGNOSIS — J439 Emphysema, unspecified: Secondary | ICD-10-CM | POA: Diagnosis not present

## 2020-04-16 DIAGNOSIS — G8929 Other chronic pain: Secondary | ICD-10-CM

## 2020-04-16 DIAGNOSIS — M545 Low back pain, unspecified: Secondary | ICD-10-CM

## 2020-04-16 DIAGNOSIS — Z20822 Contact with and (suspected) exposure to covid-19: Secondary | ICD-10-CM | POA: Diagnosis present

## 2020-04-16 DIAGNOSIS — E1142 Type 2 diabetes mellitus with diabetic polyneuropathy: Secondary | ICD-10-CM | POA: Diagnosis not present

## 2020-04-16 DIAGNOSIS — Z9221 Personal history of antineoplastic chemotherapy: Secondary | ICD-10-CM | POA: Diagnosis not present

## 2020-04-16 DIAGNOSIS — R531 Weakness: Secondary | ICD-10-CM | POA: Diagnosis not present

## 2020-04-16 DIAGNOSIS — S32501A Unspecified fracture of right pubis, initial encounter for closed fracture: Secondary | ICD-10-CM | POA: Diagnosis not present

## 2020-04-16 DIAGNOSIS — H9190 Unspecified hearing loss, unspecified ear: Secondary | ICD-10-CM | POA: Diagnosis present

## 2020-04-16 DIAGNOSIS — M25551 Pain in right hip: Secondary | ICD-10-CM | POA: Diagnosis not present

## 2020-04-16 DIAGNOSIS — R5381 Other malaise: Secondary | ICD-10-CM | POA: Diagnosis not present

## 2020-04-16 DIAGNOSIS — R278 Other lack of coordination: Secondary | ICD-10-CM | POA: Diagnosis not present

## 2020-04-16 DIAGNOSIS — I4891 Unspecified atrial fibrillation: Secondary | ICD-10-CM | POA: Diagnosis present

## 2020-04-16 DIAGNOSIS — K219 Gastro-esophageal reflux disease without esophagitis: Secondary | ICD-10-CM | POA: Diagnosis present

## 2020-04-16 DIAGNOSIS — I82403 Acute embolism and thrombosis of unspecified deep veins of lower extremity, bilateral: Secondary | ICD-10-CM | POA: Diagnosis not present

## 2020-04-16 DIAGNOSIS — W19XXXA Unspecified fall, initial encounter: Secondary | ICD-10-CM

## 2020-04-16 DIAGNOSIS — E785 Hyperlipidemia, unspecified: Secondary | ICD-10-CM | POA: Diagnosis present

## 2020-04-16 DIAGNOSIS — I10 Essential (primary) hypertension: Secondary | ICD-10-CM

## 2020-04-16 DIAGNOSIS — G311 Senile degeneration of brain, not elsewhere classified: Secondary | ICD-10-CM | POA: Diagnosis not present

## 2020-04-16 DIAGNOSIS — W010XXA Fall on same level from slipping, tripping and stumbling without subsequent striking against object, initial encounter: Secondary | ICD-10-CM | POA: Diagnosis present

## 2020-04-16 DIAGNOSIS — Z9181 History of falling: Secondary | ICD-10-CM | POA: Diagnosis not present

## 2020-04-16 DIAGNOSIS — R279 Unspecified lack of coordination: Secondary | ICD-10-CM | POA: Diagnosis not present

## 2020-04-16 DIAGNOSIS — Z853 Personal history of malignant neoplasm of breast: Secondary | ICD-10-CM | POA: Diagnosis not present

## 2020-04-16 DIAGNOSIS — M1990 Unspecified osteoarthritis, unspecified site: Secondary | ICD-10-CM | POA: Diagnosis present

## 2020-04-16 DIAGNOSIS — Z66 Do not resuscitate: Secondary | ICD-10-CM | POA: Diagnosis present

## 2020-04-16 DIAGNOSIS — Z79899 Other long term (current) drug therapy: Secondary | ICD-10-CM | POA: Diagnosis not present

## 2020-04-16 DIAGNOSIS — Z880 Allergy status to penicillin: Secondary | ICD-10-CM | POA: Diagnosis not present

## 2020-04-16 DIAGNOSIS — Z7901 Long term (current) use of anticoagulants: Secondary | ICD-10-CM | POA: Diagnosis not present

## 2020-04-16 DIAGNOSIS — Z923 Personal history of irradiation: Secondary | ICD-10-CM | POA: Diagnosis not present

## 2020-04-16 DIAGNOSIS — S32591A Other specified fracture of right pubis, initial encounter for closed fracture: Principal | ICD-10-CM

## 2020-04-16 DIAGNOSIS — S32501D Unspecified fracture of right pubis, subsequent encounter for fracture with routine healing: Secondary | ICD-10-CM | POA: Diagnosis not present

## 2020-04-16 DIAGNOSIS — R262 Difficulty in walking, not elsewhere classified: Secondary | ICD-10-CM | POA: Diagnosis not present

## 2020-04-16 DIAGNOSIS — Z86718 Personal history of other venous thrombosis and embolism: Secondary | ICD-10-CM | POA: Diagnosis not present

## 2020-04-16 DIAGNOSIS — M5136 Other intervertebral disc degeneration, lumbar region: Secondary | ICD-10-CM | POA: Diagnosis not present

## 2020-04-16 DIAGNOSIS — Z86711 Personal history of pulmonary embolism: Secondary | ICD-10-CM | POA: Diagnosis not present

## 2020-04-16 DIAGNOSIS — Z8711 Personal history of peptic ulcer disease: Secondary | ICD-10-CM | POA: Diagnosis not present

## 2020-04-16 DIAGNOSIS — Z8249 Family history of ischemic heart disease and other diseases of the circulatory system: Secondary | ICD-10-CM | POA: Diagnosis not present

## 2020-04-16 DIAGNOSIS — M4820 Kissing spine, site unspecified: Secondary | ICD-10-CM | POA: Diagnosis not present

## 2020-04-16 DIAGNOSIS — M6281 Muscle weakness (generalized): Secondary | ICD-10-CM | POA: Diagnosis not present

## 2020-04-16 DIAGNOSIS — Z1159 Encounter for screening for other viral diseases: Secondary | ICD-10-CM | POA: Diagnosis not present

## 2020-04-16 DIAGNOSIS — Y92000 Kitchen of unspecified non-institutional (private) residence as  the place of occurrence of the external cause: Secondary | ICD-10-CM | POA: Diagnosis not present

## 2020-04-16 LAB — SARS CORONAVIRUS 2 (TAT 6-24 HRS): SARS Coronavirus 2: NEGATIVE

## 2020-04-16 LAB — BASIC METABOLIC PANEL
Anion gap: 10 (ref 5–15)
BUN: 10 mg/dL (ref 8–23)
CO2: 26 mmol/L (ref 22–32)
Calcium: 9 mg/dL (ref 8.9–10.3)
Chloride: 102 mmol/L (ref 98–111)
Creatinine, Ser: 0.62 mg/dL (ref 0.44–1.00)
GFR, Estimated: >60 mL/min (ref >60)
Glucose, Bld: 112 mg/dL — ABNORMAL HIGH (ref 70–99)
Potassium: 4.3 mmol/L (ref 3.5–5.1)
Sodium: 138 mmol/L (ref 135–145)

## 2020-04-16 LAB — CBC
HCT: 33.9 % — ABNORMAL LOW (ref 36.0–46.0)
Hemoglobin: 11.3 g/dL — ABNORMAL LOW (ref 12.0–15.0)
MCH: 30.2 pg (ref 26.0–34.0)
MCHC: 33.3 g/dL (ref 30.0–36.0)
MCV: 90.6 fL (ref 80.0–100.0)
Platelets: 215 10*3/uL (ref 150–400)
RBC: 3.74 MIL/uL — ABNORMAL LOW (ref 3.87–5.11)
RDW: 15.2 % (ref 11.5–15.5)
WBC: 8.2 10*3/uL (ref 4.0–10.5)
nRBC: 0 % (ref 0.0–0.2)

## 2020-04-16 LAB — PROTIME-INR
INR: 2.1 — ABNORMAL HIGH (ref 0.8–1.2)
Prothrombin Time: 22.5 seconds — ABNORMAL HIGH (ref 11.4–15.2)

## 2020-04-16 MED ORDER — MORPHINE SULFATE (PF) 2 MG/ML IV SOLN: 2.0000 mg | INTRAVENOUS | Status: DC | PRN

## 2020-04-16 MED ORDER — ENSURE ENLIVE PO LIQD
237.0000 mL | Freq: Two times a day (BID) | ORAL | Status: DC
  Administered 2020-04-16 – 2020-04-19 (×6): 237 mL via ORAL

## 2020-04-16 MED ORDER — WARFARIN SODIUM 5 MG PO TABS
5.0000 mg | ORAL_TABLET | Freq: Once | ORAL | Status: AC
  Administered 2020-04-16: 5 mg via ORAL
  Filled 2020-04-16: qty 1

## 2020-04-16 NOTE — Progress Notes (Signed)
[place] for Warfarin Dosing Indication: VTE prophylaxis  Allergies  Allergen Reactions  . Penicillins Other (See Comments)    Unknown- pt states been a long time ago  Has patient had a PCN reaction causing immediate rash, facial/tongue/throat swelling, SOB or lightheadedness with hypotension: Unknown Has patient had a PCN reaction causing severe rash involving mucus membranes or skin necrosis: Unknown Has patient had a PCN reaction that required hospitalization: Unknown Has patient had a PCN reaction occurring within the last 10 years: Unknown If all of the above answers are "NO", then may proceed with Cephalosporin use.  Marland Kitchen Penicillin V Potassium Nausea And Vomiting    Patient Measurements: Height: 5\' 9"  (175.3 cm) Weight: 97.5 kg (215 lb) IBW/kg (Calculated) : 66.2  Vital Signs: Temp: 98.8 F (37.1 C) (02/10 1800) Temp Source: Oral (02/10 1800) BP: 134/78 (02/10 2300) Pulse Rate: 105 (02/10 2300)  Labs: Recent Labs    04/15/20 1915 04/15/20 2020  HGB 12.1  --   HCT 36.2  --   PLT 234  --   LABPROT 21.8*  --   INR 2.0*  --   CREATININE  --  0.69    Estimated Creatinine Clearance: 67.4 mL/min (by C-G formula based on SCr of 0.69 mg/dL).   Medical History: Past Medical History:  Diagnosis Date  . Anemia   . Arthritis   . Atrial fibrillation ([place])   . Breast cancer ([place])    s/p lumpectomy 1992.  s/p chemo and xrt left breast  . Diabetes mellitus ([place])   . Edema    feet/legs  . Gastric ulcer   . GERD (gastroesophageal reflux disease)   . HOH (hard of hearing)    aides  . Hypercholesterolemia   . Hypertension   . Personal history of chemotherapy   . Pulmonary emboli (HCC)     Medications:  Pt on warfarin PTA, taking 2.5 mg and 5 mg alternating days.  Pt last dose was on 04/14/20 but family member not sure if pt last took 2.5 or 5 mg dose.  Assessment: Pt is 80 yo female with med hx including DVT of the lower  extremity, pulmonary embolism, on chronic anticoagulation.  Pt home warfarin regimen weekly ave total dose of 26.25  Mg (3.75 mg/day).  Goal of Therapy:  INR 2-3 Monitor H&H.  02/09 Family reported pt took 2.5 or 5 mg dose 02/10 1915 INR = 2.0 (@ Admission), dose missed    Plan:  Since INR at bottom of therapeutic range at admission and with it uncertain which dose pt last took on 2/9 placed order @ 2215 for Coumadin 4 mg once for 12/10 and repeat INR with AM labs.    As of 0400, dose still not given due to pt transition to floor from ED after order entered.  Called RN, pt now currently asleep.  Informed nurse not to give dose and pharmacy will adjust and schedule next dose for this afternoon after reviewing AM labs.  Renda Rolls, PharmD, [facility] 04/16/2020 3:50 AM

## 2020-04-16 NOTE — Telephone Encounter (Signed)
Patient has been hospitalized for another fall

## 2020-04-16 NOTE — Progress Notes (Signed)
[place] for Warfarin Dosing Indication: VTE prophylaxis  Allergies  Allergen Reactions  . Penicillins Other (See Comments)    Unknown- pt states been a long time ago  Has patient had a PCN reaction causing immediate rash, facial/tongue/throat swelling, SOB or lightheadedness with hypotension: Unknown Has patient had a PCN reaction causing severe rash involving mucus membranes or skin necrosis: Unknown Has patient had a PCN reaction that required hospitalization: Unknown Has patient had a PCN reaction occurring within the last 10 years: Unknown If all of the above answers are "NO", then may proceed with Cephalosporin use.  Marland Kitchen Penicillin V Potassium Nausea And Vomiting    Patient Measurements: Height: 5\' 9"  (175.3 cm) Weight: 97.5 kg (215 lb) IBW/kg (Calculated) : 66.2  Vital Signs: Temp: 97.6 F (36.4 C) (02/11 1337) Temp Source: Oral (02/11 1337) BP: 135/62 (02/11 1337) Pulse Rate: 96 (02/11 1337)  Labs: Recent Labs    04/15/20 1915 04/15/20 2020 04/16/20 0606  HGB 12.1  --  11.3*  HCT 36.2  --  33.9*  PLT 234  --  215  LABPROT 21.8*  --  22.5*  INR 2.0*  --  2.1*  CREATININE  --  0.69 0.62    Estimated Creatinine Clearance: 67.4 mL/min (by C-G formula based on SCr of 0.62 mg/dL).   Medical History: Past Medical History:  Diagnosis Date  . Anemia   . Arthritis   . Atrial fibrillation ([place])   . Breast cancer ([place])    s/p lumpectomy 1992.  s/p chemo and xrt left breast  . Diabetes mellitus ([place])   . Edema    feet/legs  . Gastric ulcer   . GERD (gastroesophageal reflux disease)   . HOH (hard of hearing)    aides  . Hypercholesterolemia   . Hypertension   . Personal history of chemotherapy   . Pulmonary emboli (HCC)     Medications:  Pt on warfarin PTA, taking 2.5 mg and 5 mg alternating days.  Pt last dose was on 04/14/20 but family member not sure if pt last took 2.5 or 5 mg dose.  Assessment: Pt is 80 yo female  with med hx including DVT of the lower extremity, pulmonary embolism, on chronic anticoagulation.  Pt home warfarin regimen weekly total dose of 26.25  Mg (3.75 mg/day).  Goal of Therapy:  INR 2-3 Monitor H&H.  02/09 Family reported pt took 2.5 or 5 mg dose 02/10 1915 INR = 2.0 (@ Admission), dose missed 2/10    Plan:  Will order Warfarin 5mg  x 1 dose today. Will likely continue on home regimen since INR on admission was therapeutic, but may need to supplement a little extra since dose on 2/10 was missed. Will wait to see how INR trends. Daily INR checks until stable.   Paulina Fusi, PharmD, BCPS 04/16/2020 2:38 PM

## 2020-04-16 NOTE — NC FL2 (Signed)
[place] LEVEL OF CARE SCREENING TOOL     IDENTIFICATION  Patient Name: Anita Walker Birthdate: 02-Dec-1940 Sex: female Admission Date (Current Location): 04/15/2020  [place] and [place] Number:  Engineering geologist and Address:  [facility], [address], [place], [place] [address]      Provider Number: [id number]  Attending Physician Name and Address:  Fritzi Mandes, MD  Relative Name and Phone Number:  Cala Bradford [phone]    Current Level of Care: Hospital Recommended Level of Care: [place] Prior Approval Number:    Date Approved/Denied:   PASRR Number: [id number]  Discharge Plan: SNF    Current Diagnoses: Patient Active Problem List   Diagnosis Date Noted  . Pelvic fracture ([place]) 04/15/2020  . Cerebral atrophy ([place]) 03/20/2020  . Chronic low back pain ([place]) w/o sciatica 01/26/2020  . 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 Grade 1 Retrolisthesis of L3/L4 12/18/2019  . Lip lesion 12/14/2019  . Acute exacerbation of chronic low back pain 12/11/2019  . At high risk for falls 12/11/2019  . Fall at home, sequela 12/11/2019  . Head injury 12/02/2019  . Bradycardia following surgery 11/18/2019  . Wrist fracture 11/02/2019  . Weakness 11/02/2019  . Change in bowel movement 11/02/2019  . DDD (degenerative disc disease), lumbosacral 09/18/2019  . Irritability and anger 09/18/2019  . Nausea without vomiting 09/18/2019  . History of vasovagal symptom 08/14/2019  . Polyp of descending colon   . Positive colorectal cancer screening using Cologuard test 05/25/2019  . Spondylosis without myelopathy or radiculopathy, lumbosacral region 03/11/2019  . 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 Compression fracture of thoracolumbar vertebra  (T11), sequela 02/18/2019  . Lumbar facet arthropathy (Multilevel) 02/18/2019  . 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 L5-S1 disc-osteophyte complex (Right) 02/18/2019  . Lumbar facet syndrome (Bilateral) (R>L) 02/18/2019  . Chronic anticoagulation (Coumadin) 02/18/2019  . Chronic low back pain (Bilateral) (R>L) w/o sciatica 02/18/2019  . Chronic pain syndrome 02/18/2019  . Compression fracture of thoracic vertebra ([place]) 10/12/2018  . Pain due to onychomycosis of toenails of both feet 09/02/2018  . Coagulation disorder ([place]) 09/02/2018  . Constipation 08/18/2018  . Fall 08/18/2018  . Diabetic polyneuropathy associated with type 2 diabetes mellitus ([place]) 03/18/2017  . Abnormal chest CT 03/18/2017  . Urinary frequency 01/21/2017  . Syncope 11/10/2016  . Right hip pain 10/30/2016  . Cough 10/25/2016  . Health care maintenance 05/23/2014  . Mixed emotional features as adjustment reaction 07/20/2013  . Long term current use of anticoagulant therapy 04/11/2013  . Hypertension 01/01/2012  . Hypercholesteremia 01/01/2012  . History of breast cancer 01/01/2012  . Essential hypertension 01/01/2012  . Pure hypercholesterolemia 01/01/2012  . Type 2 diabetes mellitus without complications ([employer]) 58/11/9831    Orientation RESPIRATION BLADDER Height & Weight     Self,Time,Situation,Place  Normal External catheter Weight: 97.5 kg Height:  5\' 9"  (175.3 cm)  BEHAVIORAL SYMPTOMS/MOOD NEUROLOGICAL BOWEL NUTRITION STATUS      Continent Diet (Heart Healthy)  AMBULATORY STATUS COMMUNICATION OF NEEDS Skin   Extensive Assist Verbally Surgical wounds                       Personal Care Assistance Level of Assistance  Bathing,Feeding,Dressing Bathing Assistance: Maximum assistance Feeding assistance: Limited assistance Dressing  Assistance: Maximum assistance     Functional Limitations Info  Sight,Hearing,Speech          SPECIAL CARE FACTORS FREQUENCY   PT (By licensed PT),OT (By licensed OT)                    Contractures Contractures Info: Not present    Additional Factors Info  Code Status,Allergies Code Status Info: DNR Allergies Info: Penicillin           Current Medications (04/16/2020):  This is the current hospital active medication list Current Facility-Administered Medications  Medication Dose Route Frequency Provider Last Rate Last Admin  . acetaminophen (TYLENOL) tablet 325 mg  325 mg Oral Q6H PRN Cox, Amy N, DO   325 mg at 04/16/20 6333   Or  . acetaminophen (TYLENOL) suppository 325 mg  325 mg Rectal Q6H PRN Cox, Amy N, DO      . amLODipine (NORVASC) tablet 2.5 mg  2.5 mg Oral Daily Cox, Amy N, DO   2.5 mg at 04/16/20 0923  . feeding supplement (ENSURE ENLIVE / ENSURE PLUS) liquid 237 mL  237 mL Oral BID BM Fritzi Mandes, MD      . ferrous sulfate tablet 325 mg  325 mg Oral Q breakfast Cox, Amy N, DO   325 mg at 04/16/20 0923  . losartan (COZAAR) tablet 100 mg  100 mg Oral Daily Cox, Amy N, DO   100 mg at 04/16/20 0923  . morphine 2 MG/ML injection 2 mg  2 mg Intravenous Q4H PRN Fritzi Mandes, MD      . multivitamin with minerals tablet 1 tablet  1 tablet Oral Daily Cox, Amy N, DO   1 tablet at 04/16/20 0923  . ondansetron (ZOFRAN) tablet 4 mg  4 mg Oral Q6H PRN Cox, Amy N, DO       Or  . ondansetron (ZOFRAN) injection 4 mg  4 mg Intravenous Q6H PRN Cox, Amy N, DO   4 mg at 04/16/20 1256  . pantoprazole (PROTONIX) EC tablet 20 mg  20 mg Oral Daily Cox, Amy N, DO      . simvastatin (ZOCOR) tablet 10 mg  10 mg Oral QHS Cox, Amy N, DO   10 mg at 04/16/20 0139  . traMADol (ULTRAM) tablet 50 mg  50 mg Oral Q6H PRN Cox, Amy N, DO   50 mg at 04/16/20 1337  . warfarin (COUMADIN) tablet 5 mg  5 mg Oral ONCE-1600 Vira Blanco, RPH      . Warfarin - Pharmacist Dosing Inpatient   Does not apply q1600 Cox, Amy N, DO         Discharge Medications: Please see discharge summary for a list of discharge  medications.  Relevant Imaging Results:  Relevant Lab Results:   Additional Information SS# [id number]  Shelbie Ammons, RN

## 2020-04-16 NOTE — Progress Notes (Signed)
[facility] [facility] [place] NAME: Anita Walker    MR#:  [id number]  DATE OF BIRTH:  May 06, 1940  SUBJECTIVE:   Patient came in with mechanical fall at home. Sustain a pelvic fracture. She is complaining of right leg pain neck pain and headache today. Did not sleep well last night.  No family in the room  Patient has had few falls in the past. REVIEW OF SYSTEMS:   Review of Systems  Constitutional: Negative for chills, fever and weight loss.  HENT: Negative for ear discharge, ear pain and nosebleeds.   Eyes: Negative for blurred vision, pain and discharge.  Respiratory: Negative for sputum production, shortness of breath, wheezing and stridor.   Cardiovascular: Negative for chest pain, palpitations, orthopnea and PND.  Gastrointestinal: Negative for abdominal pain, diarrhea, nausea and vomiting.  Genitourinary: Negative for frequency and urgency.  Musculoskeletal: Positive for falls, joint pain and neck pain. Negative for back pain.  Neurological: Positive for weakness and headaches. Negative for sensory change, speech change and focal weakness.  Psychiatric/Behavioral: Negative for depression and hallucinations. The patient is not nervous/anxious.    Tolerating Diet: Tolerating PT:   DRUG ALLERGIES:   Allergies  Allergen Reactions  . Penicillins Other (See Comments)    Unknown- pt states been a long time ago  Has patient had a PCN reaction causing immediate rash, facial/tongue/throat swelling, SOB or lightheadedness with hypotension: Unknown Has patient had a PCN reaction causing severe rash involving mucus membranes or skin necrosis: Unknown Has patient had a PCN reaction that required hospitalization: Unknown Has patient had a PCN reaction occurring within the last 10 years: Unknown If all of the above answers are "NO", then may proceed with Cephalosporin use.  Marland Kitchen Penicillin V Potassium Nausea And Vomiting    VITALS:  Blood pressure  (!) 111/52, pulse 89, temperature 98.3 F (36.8 C), temperature source Oral, resp. rate 18, height 5\' 9"  (1.753 m), weight 97.5 kg, SpO2 91 %.  PHYSICAL EXAMINATION:   Physical Exam  GENERAL:  80 y.o.-year-old patient lying in the bed with no acute distress.  LUNGS: Normal breath sounds bilaterally, no wheezing, rales, rhonchi. No use of accessory muscles of respiration.  CARDIOVASCULAR: S1, S2 normal. No murmurs, rubs, or gallops.  ABDOMEN: Soft, nontender, nondistended. Bowel sounds present. No organomegaly or mass.  EXTREMITIES: decreased range of motion of the hip joint due to pelvic fracture NEUROLOGIC: Cranial nerves II through XII are intact. No focal Motor or sensory deficits b/l.   PSYCHIATRIC:  patient is alert and oriented x 3.  SKIN: No obvious rash, lesion, or ulcer.   LABORATORY PANEL:  CBC Recent Labs  Lab 04/16/20 0606  WBC 8.2  HGB 11.3*  HCT 33.9*  PLT 215    Chemistries  Recent Labs  Lab 04/16/20 0606  NA 138  K 4.3  CL 102  CO2 26  GLUCOSE 112*  BUN 10  CREATININE 0.62  CALCIUM 9.0   Cardiac Enzymes No results for input(s): TROPONINI in the last 168 hours. RADIOLOGY:  DG Lumbar Spine 2-3 Views  Result Date: 04/15/2020 CLINICAL DATA:  Fall EXAM: LUMBAR SPINE - 2-3 VIEW COMPARISON:  12/11/2019 FINDINGS: Moderate compression fracture involving, stable since prior the L3 vertebral body study. Diffuse degenerative disc and facet disease. No acute fracture. Normal alignment. IMPRESSION: Stable chronic L3 compression fracture. Spondylosis. No acute findings. Electronically Signed   By: Rolm Baptise M.D.   On: 04/15/2020 19:02   CT Head  Wo Contrast  Result Date: 04/15/2020 CLINICAL DATA:  Fall, landing on her bottom. Denies hitting her head. No neck pain. EXAM: CT HEAD WITHOUT CONTRAST CT CERVICAL SPINE WITHOUT CONTRAST TECHNIQUE: Multidetector CT imaging of the head and cervical spine was performed following the standard protocol without intravenous  contrast. Multiplanar CT image reconstructions of the cervical spine were also generated. COMPARISON:  CT head and cervical spine dated March 19, 2020. FINDINGS: CT HEAD FINDINGS Brain: No evidence of acute infarction, hemorrhage, hydrocephalus, extra-axial collection or mass lesion/mass effect. Stable atrophy and chronic microvascular ischemic changes. Vascular: Calcified atherosclerosis at the skullbase. No hyperdense vessel. Skull: Normal. Negative for fracture or focal lesion. Sinuses/Orbits: No acute finding. Other: None. CT CERVICAL SPINE FINDINGS Alignment: No traumatic malalignment. Unchanged trace anterolisthesis at C3-C4 and C4-C5. Skull base and vertebrae: No acute fracture. No primary bone lesion or focal pathologic process. Soft tissues and spinal canal: No prevertebral fluid or swelling. No visible canal hematoma. Disc levels: Unchanged moderate disc height loss and uncovertebral hypertrophy at C5-C6 and C6-C7. Unchanged diffuse facet arthropathy, advanced on the left at C3-C4 and C4-C5. Upper chest: Negative. Other: 1.3 cm hypodense nodule in the right thyroid gland. Not clinically significant; no follow-up imaging recommended. IMPRESSION: 1. No acute intracranial abnormality. Stable atrophy and chronic microvascular ischemic changes. 2. No acute cervical spine fracture or traumatic listhesis. 3. Unchanged moderate cervical spondylosis. Electronically Signed   By: Titus Dubin M.D.   On: 04/15/2020 18:34   CT Cervical Spine Wo Contrast  Result Date: 04/15/2020 CLINICAL DATA:  Fall, landing on her bottom. Denies hitting her head. No neck pain. EXAM: CT HEAD WITHOUT CONTRAST CT CERVICAL SPINE WITHOUT CONTRAST TECHNIQUE: Multidetector CT imaging of the head and cervical spine was performed following the standard protocol without intravenous contrast. Multiplanar CT image reconstructions of the cervical spine were also generated. COMPARISON:  CT head and cervical spine dated March 19, 2020.  FINDINGS: CT HEAD FINDINGS Brain: No evidence of acute infarction, hemorrhage, hydrocephalus, extra-axial collection or mass lesion/mass effect. Stable atrophy and chronic microvascular ischemic changes. Vascular: Calcified atherosclerosis at the skullbase. No hyperdense vessel. Skull: Normal. Negative for fracture or focal lesion. Sinuses/Orbits: No acute finding. Other: None. CT CERVICAL SPINE FINDINGS Alignment: No traumatic malalignment. Unchanged trace anterolisthesis at C3-C4 and C4-C5. Skull base and vertebrae: No acute fracture. No primary bone lesion or focal pathologic process. Soft tissues and spinal canal: No prevertebral fluid or swelling. No visible canal hematoma. Disc levels: Unchanged moderate disc height loss and uncovertebral hypertrophy at C5-C6 and C6-C7. Unchanged diffuse facet arthropathy, advanced on the left at C3-C4 and C4-C5. Upper chest: Negative. Other: 1.3 cm hypodense nodule in the right thyroid gland. Not clinically significant; no follow-up imaging recommended. IMPRESSION: 1. No acute intracranial abnormality. Stable atrophy and chronic microvascular ischemic changes. 2. No acute cervical spine fracture or traumatic listhesis. 3. Unchanged moderate cervical spondylosis. Electronically Signed   By: Titus Dubin M.D.   On: 04/15/2020 18:34   DG Hip Unilat W or Wo Pelvis 2-3 Views Right  Result Date: 04/15/2020 CLINICAL DATA:  Fall EXAM: DG HIP (WITH OR WITHOUT PELVIS) 2-3V RIGHT COMPARISON:  None. FINDINGS: Fracture through the right superior and inferior pubic rami. The superior pubic ramus fracture is near the medial wall of the right acetabulum. No proximal femoral fracture, subluxation or dislocation. Mild degenerative changes in the hips. IMPRESSION: Right superior and inferior pubic rami fractures. Electronically Signed   By: Rolm Baptise M.D.   On: 04/15/2020 19:03  ASSESSMENT AND PLAN:  Anita Walker is a 80 y.o. female with medical history significant for  hypertension, at risk for falls, hyperlipidemia, non-insulin-dependent diabetes mellitus, diabetic polyneuropathy, history of left breast cancer status post lumpectomy and radiation and hormonal therapy approximately 30 years ago, DVT of the lower extremity, pulmonary embolism, on chronic anticoagulation with Coumadin, presents to the emergency department for chief concerns of a fall.  Right superior and inferior pubic rami fracture secondary to mechanical fall -Present on admission -Pain control with IV morphine as needed for severe pain, tramadol as needed for moderate pain, acetaminophen as needed for mild pain -TOC, PT, OT consulted -Fall precautions  Hypertension-amlodipine ,losartan   Hyperlipidemia-simvastatin ]  History of DVT and pulmonary embolism -resumed home warfarin 2.5 mg daily -Warfarin per pharmacy -- discussed at length with patient's daughter on the phone. Patient has been on warfarin for more than 10 years for history of DVT and PE. She is been having frequent falls according to the daughter. I mentioned to the daughter to discuss with Dr. Einar Pheasant regarding continuation of Coumadin given frequent falls.  GERD-resumed home PPI   history of left breast cancer status post lumpectomy, radiation, hormonal therapy for 5 years.   DVT prophylaxis: Warfarin Code Status: DNR Diet: Heart healthy Family Communication: discussed with daughter on the phone, Cala Bradford Disposition Plan: Pending clinical course and PT evaluation Consults called: TOC for SNF placement, PT, OT  Level of care: Med-Surg Status is: Observation        TOTAL TIME TAKING CARE OF THIS PATIENT: 25 minutes.  >50% time spent on counselling and coordination of care  Note: This dictation was prepared with Dragon dictation along with smaller phrase technology. Any transcriptional errors that result from this process are unintentional.  Norville Haggard.   [facility]    CC: Primary care physician; Einar Pheasant, MDPatient ID: Anita Walker, female   DOB: 1941-02-25, 80 y.o.   MRN: [id number]

## 2020-04-16 NOTE — TOC Initial Note (Signed)
Transition of Care [facility]) - Initial/Assessment Note    Patient Details  Name: Anita Walker MRN: [id number] Date of Birth: 09/06/1940  Transition of Care [facility]) CM/SW Contact:    Shelbie Ammons, RN Phone Number: 04/16/2020, 3:36 PM  Clinical Narrative:   RNCM attempted to meet with patient in room however she was sleeping soundly. RNCM placed call to patient's daughter [name]. [name] reports that it is there wish for patient to go to SNF, she reports that patient left [facility] on 1/14 and had 3 falls in first day. Discussed with [name] that due to patient being in SNF in the last couple of months it is possible she might be in her copay days but that it would be up the facility to let her know this. [name] verbalizes understanding and that she is agreeable to a bed search of all local facilities. RNCM verified PASSR, completed FL2 and started bed search.           Expected Discharge Plan: Skilled Nursing Facility Barriers to Discharge: No Barriers Identified   Patient Goals and CMS Choice        Expected Discharge Plan and Services Expected Discharge Plan: [place]       Living arrangements for the past 2 months: Single Family Home                                      Prior Living Arrangements/Services Living arrangements for the past 2 months: Single Family Home   Patient language and need for interpreter reviewed:: Yes        Need for Family Participation in Patient Care: Yes (Comment) Care giver support system in place?: Yes (comment)   Criminal Activity/Legal Involvement Pertinent to Current Situation/Hospitalization: No - Comment as needed  Activities of Daily Living Home Assistive Devices/Equipment: Walker (specify type) ADL Screening (condition at time of admission) Patient's cognitive ability adequate to safely complete daily activities?: Yes Is the patient deaf or have difficulty hearing?: Yes Does the patient have difficulty seeing, even  when wearing glasses/contacts?: No Does the patient have difficulty concentrating, remembering, or making decisions?: Yes Patient able to express need for assistance with ADLs?: Yes Does the patient have difficulty dressing or bathing?: Yes Independently performs ADLs?: No Communication: Independent Dressing (OT): Needs assistance Is this a change from baseline?: Pre-admission baseline Grooming: Independent with device (comment) Feeding: Independent Bathing: Needs assistance Is this a change from baseline?: Pre-admission baseline Toileting: Independent with device (comment) In/Out Bed: Independent with device (comment) Walks in Home: Independent with device (comment) Does the patient have difficulty walking or climbing stairs?: Yes Weakness of Legs: Both Weakness of Arms/Hands: Both  Permission Sought/Granted                  Emotional Assessment Appearance:: Appears stated age       Alcohol / Substance Use: Not Applicable Psych Involvement: No (comment)  Admission diagnosis:  Pelvic fracture ([place]) [S32.9XXA] Fall, initial encounter [id number]] Closed fracture of multiple pubic rami, right, initial encounter [facility]) [S32.591A] Patient Active Problem List   Diagnosis Date Noted  . Pelvic fracture ([place]) 04/15/2020  . Cerebral atrophy ([place]) 03/20/2020  . Chronic low back pain ([place]) w/o sciatica 01/26/2020  . 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 Grade 1 Retrolisthesis of L3/L4 12/18/2019  .  Lip lesion 12/14/2019  . Acute exacerbation of chronic low back pain 12/11/2019  . At high risk for falls 12/11/2019  . Fall at home, sequela 12/11/2019  . Head injury 12/02/2019  . Bradycardia following surgery 11/18/2019  . Wrist fracture 11/02/2019  . Weakness 11/02/2019  . Change in bowel movement 11/02/2019  . DDD (degenerative disc disease), lumbosacral  09/18/2019  . Irritability and anger 09/18/2019  . Nausea without vomiting 09/18/2019  . History of vasovagal symptom 08/14/2019    Class: History of  . Polyp of descending colon   . Positive colorectal cancer screening using Cologuard test 05/25/2019  . Spondylosis without myelopathy or radiculopathy, lumbosacral region 03/11/2019  . 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 Compression fracture of thoracolumbar vertebra (T11), sequela 02/18/2019  . Lumbar facet arthropathy (Multilevel) 02/18/2019  . 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 L5-S1 disc-osteophyte complex (Right) 02/18/2019  . Lumbar facet syndrome (Bilateral) (R>L) 02/18/2019  . Chronic anticoagulation (Coumadin) 02/18/2019  . Chronic low back pain (Bilateral) (R>L) w/o sciatica 02/18/2019  . Chronic pain syndrome 02/18/2019  . Compression fracture of thoracic vertebra ([place]) 10/12/2018  . Pain due to onychomycosis of toenails of both feet 09/02/2018  . Coagulation disorder ([place]) 09/02/2018  . Constipation 08/18/2018  . Fall 08/18/2018  . Diabetic polyneuropathy associated with type 2 diabetes mellitus ([place]) 03/18/2017  . Abnormal chest CT 03/18/2017  . Urinary frequency 01/21/2017  . Syncope 11/10/2016  . Right hip pain 10/30/2016  . Cough 10/25/2016  . Health care maintenance 05/23/2014  . Mixed emotional features as adjustment reaction 07/20/2013  . Long term current use of anticoagulant therapy 04/11/2013  . Hypertension 01/01/2012  . Hypercholesteremia 01/01/2012  . History of breast cancer 01/01/2012  . Essential hypertension 01/01/2012  . Pure hypercholesterolemia 01/01/2012  . Type 2 diabetes mellitus without complications ([place]) 98/33/8250   PCP:  Einar Pheasant, MD Pharmacy:   [facility] [place], [place] - [place] [facility] [address] [place] [address] Phone: [phone] Fax:  [phone]     Social Determinants of Health (SDOH) Interventions    Readmission Risk Interventions No flowsheet data found.

## 2020-04-16 NOTE — Evaluation (Signed)
Physical Therapy Evaluation Patient Details Name: Anita Walker MRN: [id number] DOB: 1941-01-20 Today's Date: 04/16/2020   History of Present Illness  Per MD note: Pt is an 80 y.o. female with medical history significant for hypertension, hyperlipidemia, non-insulin-dependent diabetes mellitus, diabetic polyneuropathy, history of left breast cancer status post lumpectomy and radiation and hormonal therapy approximately 30 years ago, DVT of the lower extremity, pulmonary embolism, on chronic anticoagulation with Coumadin, presents to the emergency department for chief concerns of a fall.  MD assessment includes: Right superior and inferior pubic rami fractures secondary to mechanical fall and HTN.    Clinical Impression  Pt pleasant but very self-limiting and required frequent encouragement to facilitate participation during the session.  Pt was essentially +2 total assist with bed mobility and heavy +2 to stand from an elevated EOB and could not advance either LE during amb attempts.  Will make pt frequency 2x/wk at this time secondary to significant pain with any mobility and very little active participation during the session.  Pt will benefit from a trial of PT services in a SNF setting upon discharge to safely address deficits listed in patient problem list for decreased caregiver assistance and eventual return to PLOF.      Follow Up Recommendations SNF    Equipment Recommendations  Other (comment) (TBD at next venue of care)    Recommendations for Other Services       Precautions / Restrictions Precautions Precautions: Fall Restrictions Weight Bearing Restrictions: Yes RLE Weight Bearing: Weight bearing as tolerated Other Position/Activity Restrictions: WBAT per Dr. Fritzi Mandes      Mobility  Bed Mobility Overal bed mobility: Needs Assistance Bed Mobility: Sit to Supine;Supine to Sit;Rolling Rolling: +2 for physical assistance;Total assist   Supine to sit: +2 for physical  assistance;Total assist Sit to supine: Total assist;+2 for physical assistance   General bed mobility comments: Pt put forth no effort with bed mobility tasks    Transfers Overall transfer level: Needs assistance Equipment used: Rolling walker (2 wheeled) Transfers: Sit to/from Stand Sit to Stand: +2 physical assistance;Mod assist;From elevated surface         General transfer comment: Pt with posterior lean in standing requiring +2 Mod A continuously to prevent posterior LOB  Ambulation/Gait         Gait velocity: Pt unable to advance either LE in standing   General Gait Details: Pt unable to advance either LE  Stairs            Wheelchair Mobility    Modified Rankin (Stroke Patients Only)       Balance Overall balance assessment: Needs assistance Sitting-balance support: Bilateral upper extremity supported Sitting balance-Leahy Scale: Poor Sitting balance - Comments: Frequrent assist required to prevent posterior LOB   Standing balance support: Bilateral upper extremity supported Standing balance-Leahy Scale: Poor Standing balance comment: Constant assist required to prevent posterior LOB                             Pertinent Vitals/Pain Pain Assessment: Faces Faces Pain Scale: Hurts whole lot Pain Location: General body pain per patient with movement only Pain Descriptors / Indicators: Grimacing;Guarding;Moaning Pain Intervention(s): Premedicated before session;Monitored during session    Home Living Family/patient expects to be discharged to:: Private residence Living Arrangements: Alone Available Help at Discharge: Available PRN/intermittently;Family Type of Home: House Home Access: Stairs to enter Entrance Stairs-Rails: Left Entrance Stairs-Number of Steps: 3 Home Layout: Two level;Other (  Comment) (Split level) Home Equipment: Walker - 2 wheels Additional Comments: History from daughter via phone call secondary to pt being a poor  historian    Prior Function Level of Independence: Independent with assistive device(s)         Comments: Mod Ind ambulation household distances only with a RW, 6 falls in the last 6 months, Ind with ADLs     Hand Dominance        Extremity/Trunk Assessment   Upper Extremity Assessment Upper Extremity Assessment: Generalized weakness    Lower Extremity Assessment Lower Extremity Assessment: Generalized weakness;RLE deficits/detail;LLE deficits/detail RLE: Unable to fully assess due to pain LLE: Unable to fully assess due to pain       Communication   Communication: HOH  Cognition Arousal/Alertness: Lethargic Behavior During Therapy: WFL for tasks assessed/performed Overall Cognitive Status: No family/caregiver present to determine baseline cognitive functioning                                 General Comments: Per daughter 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      General Comments      Exercises Other Exercises Other Exercises: Gentle BLE anke and knee AA/PROM in supine and sitting to patient's tolerance Other Exercises: Anterior weight shifting in sitting to address posterior instability   Assessment/Plan    PT Assessment Patient needs continued PT services  PT Problem List Decreased strength;Decreased activity tolerance;Decreased balance;Decreased mobility;Decreased knowledge of use of DME;Pain;Decreased safety awareness       PT Treatment Interventions DME instruction;Gait training;Stair training;Functional mobility training;Therapeutic activities;Therapeutic exercise;Balance training;Patient/family education    PT Goals (Current goals can be found in the Care Plan section)  Acute Rehab PT Goals Patient Stated Goal: Move with less pain PT Goal Formulation: With patient Time For Goal Achievement: 04/29/20 Potential to Achieve Goals: Good    Frequency Min 2X/week   Barriers to discharge Inaccessible home  environment;Decreased caregiver support      Co-evaluation               AM-PAC PT "6 Clicks" Mobility  Outcome Measure Help needed turning from your back to your side while in a flat bed without using bedrails?: Total Help needed moving from lying on your back to sitting on the side of a flat bed without using bedrails?: Total Help needed moving to and from a bed to a chair (including a wheelchair)?: Total Help needed standing up from a chair using your arms (e.g., wheelchair or bedside chair)?: Total Help needed to walk in hospital room?: Total Help needed climbing 3-5 steps with a railing? : Total 6 Click Score: 6    End of Session Equipment Utilized During Treatment: Gait belt Activity Tolerance: Patient limited by pain Patient left: in bed;with call bell/phone within reach;with bed alarm set;with nursing/sitter in room Nurse Communication: Mobility status;Weight bearing status PT Visit Diagnosis: History of falling (Z91.81);Unsteadiness on feet (R26.81);Repeated falls (R29.6);Difficulty in walking, not elsewhere classified (R26.2);Muscle weakness (generalized) (M62.81);Pain Pain - Right/Left: Right Pain - part of body: Hip    Time: 9833-8250 PT Time Calculation (min) (ACUTE ONLY): 39 min   Charges:   PT Evaluation $PT Eval Moderate Complexity: 1 Mod PT Treatments $Therapeutic Activity: 8-22 mins        D. Royetta Asal PT, DPT 04/16/20, 2:54 PM

## 2020-04-16 NOTE — Progress Notes (Signed)
OT Cancellation Note  Patient Details Name: Anita Walker MRN: [id number] DOB: 1940-07-27   Cancelled Treatment:    Reason Eval/Treat Not Completed: Patient declined, no reason specified. Pt supine in bed with NT present obtaining vital signs. Pt reports feeling nauseated at this time and declined OT intervention. OT order received and chart reviewed. OT to re-attempt at next available time.  Darleen Crocker, [place], OTR/L , CBIS ascom [phone]  04/16/20, 3:57 PM   04/16/2020, 3:55 PM

## 2020-04-16 NOTE — Progress Notes (Signed)
Initial Nutrition Assessment  DOCUMENTATION CODES:   Obesity unspecified  INTERVENTION:  Ensure Enlive po BID, each supplement provides 350 kcal and 20 grams of protein  MVI with minerals daily   NUTRITION DIAGNOSIS:   Increased nutrient needs related to acute illness (pelvic fracture) as evidenced by estimated needs.   GOAL:   Patient will meet greater than or equal to 90% of their needs    MONITOR:   PO intake,Weight trends,Labs,I & O's,Supplement acceptance,Skin  REASON FOR ASSESSMENT:   Malnutrition Screening Tool    ASSESSMENT:  80 year old female admitted with pelvic fracture. Past medical history significant of HTN, HLD, DM2, diabetic polyneuropathy, left breast cancer s/p lumpectomy and radiation ~30 years ago, DVT, PE on chronic anticoagulation presented after mechanical fall at home.  RD working remotely.  Attempted to reach pt via phone this afternoon, however no answer. Unable to obtain nutrition history at this time. Per notes, pt complaining of ongoing right leg and neck pain as well as headache today, reports not sleeping well last night. Suspect decreased meal intake due to pain, will order Ensure supplement to help her meet her needs.  Per chart, weights have trended down 25 lbs (10.5%) in the last 3 months; significant. Given advanced age as well as recent history of falls, highly suspect degree of malnutrition. Will plan to complete exam at follow-up.  Medications reviewed and include: Ferrous sulfate, MVI, Protonix, Warfarin  Labs reviewed A1c 5.8 on 03/25/20 (well controlled)  NUTRITION - FOCUSED PHYSICAL EXAM: Unable to complete at this time   Diet Order:   Diet Order            Diet Heart Room service appropriate? Yes; Fluid consistency: Thin  Diet effective now                 EDUCATION NEEDS:   No education needs have been identified at this time  Skin:  Skin Assessment: Reviewed RN Assessment  Last BM:  2/10  Height:   Ht  Readings from Last 1 Encounters:  04/15/20 5\' 9"  (1.753 m)    Weight:   Wt Readings from Last 1 Encounters:  04/15/20 97.5 kg    BMI:  Body mass index is 31.75 kg/m.  Estimated Nutritional Needs:   Kcal:  2100-2300  Protein:  110-120  Fluid:  2.1 L/day   Lajuan Lines, RD, LDN Clinical Nutrition After Hours/Weekend Pager # in [place]

## 2020-04-17 DIAGNOSIS — S32591A Other specified fracture of right pubis, initial encounter for closed fracture: Secondary | ICD-10-CM | POA: Diagnosis not present

## 2020-04-17 DIAGNOSIS — W19XXXA Unspecified fall, initial encounter: Secondary | ICD-10-CM | POA: Diagnosis not present

## 2020-04-17 DIAGNOSIS — M545 Low back pain, unspecified: Secondary | ICD-10-CM | POA: Diagnosis not present

## 2020-04-17 DIAGNOSIS — E1142 Type 2 diabetes mellitus with diabetic polyneuropathy: Secondary | ICD-10-CM

## 2020-04-17 LAB — PROTIME-INR
INR: 2.4 — ABNORMAL HIGH (ref 0.8–1.2)
Prothrombin Time: 25.2 seconds — ABNORMAL HIGH (ref 11.4–15.2)

## 2020-04-17 LAB — GLUCOSE, CAPILLARY: Glucose-Capillary: 126 mg/dL — ABNORMAL HIGH (ref 70–99)

## 2020-04-17 MED ORDER — WARFARIN SODIUM 2.5 MG PO TABS
2.5000 mg | ORAL_TABLET | Freq: Once | ORAL | Status: AC
  Administered 2020-04-17: 2.5 mg via ORAL
  Filled 2020-04-17: qty 1

## 2020-04-17 NOTE — Evaluation (Signed)
Occupational Therapy Evaluation Patient Details Name: Anita Walker MRN: [id number] DOB: [birth year] Today's Date: 04/17/2020    History of Present Illness Per MD note: Pt is an 80 y.o. female with medical history significant for hypertension, hyperlipidemia, non-insulin-dependent diabetes mellitus, diabetic polyneuropathy, history of left breast cancer status post lumpectomy and radiation and hormonal therapy approximately 30 years ago, DVT of the lower extremity, pulmonary embolism, on chronic anticoagulation with Coumadin, presents to the emergency department for chief concerns of a fall.  MD assessment includes: Right superior and inferior pubic rami fractures secondary to mechanical fall and HTN.   Clinical Impression   Ms. Walker presents today with generalized weakness, low endurance level, and extreme pain with any LE movement. She reports no pain while supine in bed, which increases to "11" when therapist makes slightest movement of LE. Pt unable to assist in any LE movement beyond ankle ROM and refuses any attempts at OOB activity. Ms. Walker is A&O x 4, fairly lethargic with flat affect. Engages in grooming, feeding with Mod I from bed level. Recommend continued OT while pt is hospitalized, followed by ongoing therapy in SNF to improve pt's functional mobility and return to PLOF.     Follow Up Recommendations  SNF    Equipment Recommendations       Recommendations for Other Services       Precautions / Restrictions Precautions Precautions: Fall Restrictions Weight Bearing Restrictions: Yes RLE Weight Bearing: Weight bearing as tolerated      Mobility Bed Mobility Overal bed mobility: Needs Assistance Bed Mobility: Sit to Supine;Supine to Sit;Rolling Rolling: +2 for physical assistance;Total assist   Supine to sit: +2 for physical assistance;Total assist Sit to supine: Total assist;+2 for physical assistance   General bed mobility comments: Pt screams with an attempt  at bed mobility, crying "I can't do it."    Transfers                 General transfer comment: Pt refused attempts at standing    Balance Overall balance assessment: Needs assistance Sitting-balance support: Bilateral upper extremity supported Sitting balance-Leahy Scale: Poor Sitting balance - Comments: Frequrent assist required to prevent posterior LOB       Standing balance comment: did not attempt standing                           ADL either performed or assessed with clinical judgement   ADL Overall ADL's : Modified independent                                       General ADL Comments: Pt living alone, with daughter and SIL visiting a few times a week. Pt uses RW in home for ambulation, with 6 falls in previous 6 months. Pt reports rarely leaving her home.     Vision Baseline Vision/History: Wears glasses Wears Glasses: Reading only Patient Visual Report: No change from baseline       Perception     Praxis      Pertinent Vitals/Pain Faces Pain Scale: Hurts worst Pain Location: Little/no pain supine w/o movement; pain increases to "11" with the slightest movement of LE Pain Descriptors / Indicators: Other (Comment) (vomitting) Pain Intervention(s): Limited activity within patient's tolerance;Monitored during session     Hand Dominance Right   Extremity/Trunk Assessment Upper Extremity Assessment Upper Extremity Assessment: Generalized weakness  Lower Extremity Assessment Lower Extremity Assessment: Generalized weakness;RLE deficits/detail;LLE deficits/detail RLE: Unable to fully assess due to pain LLE: Unable to fully assess due to pain       Communication Communication Communication: HOH   Cognition Arousal/Alertness: Lethargic Behavior During Therapy: WFL for tasks assessed/performed Overall Cognitive Status: Within Functional Limits for tasks assessed                                      General Comments  significant edema BLE    Exercises Other Exercises Other Exercises: BLE ankle AROM in supine; knee PROM; grooming, self-feeding   Shoulder Instructions      Home Living Family/patient expects to be discharged to:: Private residence Living Arrangements: Alone Available Help at Discharge: Available PRN/intermittently;Family Type of Home: House Home Access: Stairs to enter [employer] of Steps: 3 Entrance Stairs-Rails: Left Home Layout: Two level;Other (Comment) Alternate Level Stairs-Number of Steps: 2 Alternate Level Stairs-Rails: Left;Right;Can reach both Bathroom Shower/Tub: Occupational psychologist: Standard Bathroom Accessibility: No   Home Equipment: Environmental consultant - 2 wheels   Additional Comments: Took history directly from pt today; it aligns with history reported yesterday by daughter      Prior Functioning/Environment Level of Independence: Needs assistance    ADL's / Homemaking Assistance Needed: Daughter and SIL do shopping, cooking, cleaning, driving   Comments: Mod Ind ambulation household distances only with a RW, 6 falls in the last 6 months, Ind with ADLs        OT Problem List: Decreased strength;Increased edema;Decreased range of motion;Decreased activity tolerance;Impaired balance (sitting and/or standing);Pain      OT Treatment/Interventions: Self-care/ADL training;Therapeutic exercise;Neuromuscular education;Energy conservation;Therapeutic activities;Patient/family education    OT Goals(Current goals can be found in the care plan section) Acute Rehab OT Goals Patient Stated Goal: Move with less pain OT Goal Formulation: With patient Time For Goal Achievement: 05/01/20 Potential to Achieve Goals: Fair ADL Goals Pt Will Perform Upper Body Bathing: with modified independence;sitting Pt Will Transfer to Toilet: with mod assist;bedside commode;stand pivot transfer (using LRAD) Pt Will Perform Toileting - Clothing  Manipulation and hygiene: with mod assist;sit to/from stand (using LRAD) Additional ADL Goal #1: Pt will identify/demonstrate 2 or more fall prevention techniques  OT Frequency: Min 1X/week   Barriers to D/C: Inaccessible home environment;Decreased caregiver support          Co-evaluation              AM-PAC OT "6 Clicks" Daily Activity     Outcome Measure Help from another person eating meals?: A Little Help from another person taking care of personal grooming?: A Little Help from another person toileting, which includes using toliet, bedpan, or urinal?: Total Help from another person bathing (including washing, rinsing, drying)?: Total Help from another person to put on and taking off regular upper body clothing?: A Lot Help from another person to put on and taking off regular lower body clothing?: Total 6 Click Score: 11   End of Session    Activity Tolerance: Patient limited by pain Patient left: in bed;with bed alarm set;with call bell/phone within reach  OT Visit Diagnosis: Unsteadiness on feet (R26.81);History of falling (Z91.81);Muscle weakness (generalized) (M62.81);Pain Pain - part of body: Hip;Leg                Time: 5732-2025 OT Time Calculation (min): 30 min Charges:  OT General Charges $OT Visit: 1  Visit OT Evaluation $OT Eval Moderate Complexity: 1 Mod OT Treatments $Self Care/Home Management : 23-37 mins  Josiah Lobo, PhD, MS, OTR/L ascom [phone] 04/17/20, 10:56 AM

## 2020-04-17 NOTE — Progress Notes (Signed)
[place] at [place] NAME: Anita Walker    MR#:  [id number]  DATE OF BIRTH:  17-Feb-1941  SUBJECTIVE:   Patient came in with mechanical fall at home. Sustain a pelvic fracture. She is complaining of right leg pain neck pain  today.   No family in the room  Patient has had few falls in the past. Getting ready to work with PT REVIEW OF SYSTEMS:   Review of Systems  Constitutional: Negative for chills, fever and weight loss.  HENT: Negative for ear discharge, ear pain and nosebleeds.   Eyes: Negative for blurred vision, pain and discharge.  Respiratory: Negative for sputum production, shortness of breath, wheezing and stridor.   Cardiovascular: Negative for chest pain, palpitations, orthopnea and PND.  Gastrointestinal: Negative for abdominal pain, diarrhea, nausea and vomiting.  Genitourinary: Negative for frequency and urgency.  Musculoskeletal: Positive for falls, joint pain and neck pain. Negative for back pain.  Neurological: Positive for weakness. Negative for sensory change, speech change and focal weakness.  Psychiatric/Behavioral: Negative for depression and hallucinations. The patient is not nervous/anxious.    Tolerating Diet: Tolerating PT:   DRUG ALLERGIES:   Allergies  Allergen Reactions  . Penicillins Other (See Comments)    Unknown- pt states been a long time ago  Has patient had a PCN reaction causing immediate rash, facial/tongue/throat swelling, SOB or lightheadedness with hypotension: Unknown Has patient had a PCN reaction causing severe rash involving mucus membranes or skin necrosis: Unknown Has patient had a PCN reaction that required hospitalization: Unknown Has patient had a PCN reaction occurring within the last 10 years: Unknown If all of the above answers are "NO", then may proceed with Cephalosporin use.  Marland Kitchen Penicillin V Potassium Nausea And Vomiting    VITALS:  Blood pressure (!) 119/50, pulse 95,  temperature 98.4 F (36.9 C), temperature source Oral, resp. rate 18, height 5\' 9"  (1.753 m), weight 97.5 kg, SpO2 90 %.  PHYSICAL EXAMINATION:   Physical Exam  GENERAL:  80 y.o.-year-old patient lying in the bed with no acute distress.  LUNGS: Normal breath sounds bilaterally, no wheezing, rales, rhonchi. No use of accessory muscles of respiration.  CARDIOVASCULAR: S1, S2 normal. No murmurs, rubs, or gallops.  ABDOMEN: Soft, nontender, nondistended. Bowel sounds present. No organomegaly or mass.  EXTREMITIES: decreased range of motion of the hip joint due to pelvic fracture NEUROLOGIC: Cranial nerves II through XII are intact. No focal Motor or sensory deficits b/l.  gen weakness PSYCHIATRIC:  patient is alert and oriented x 3.  SKIN: No obvious rash, lesion, or ulcer.   LABORATORY PANEL:  CBC Recent Labs  Lab 04/16/20 0606  WBC 8.2  HGB 11.3*  HCT 33.9*  PLT 215    Chemistries  Recent Labs  Lab 04/16/20 0606  NA 138  K 4.3  CL 102  CO2 26  GLUCOSE 112*  BUN 10  CREATININE 0.62  CALCIUM 9.0   Cardiac Enzymes No results for input(s): TROPONINI in the last 168 hours. RADIOLOGY:  DG Lumbar Spine 2-3 Views  Result Date: 04/15/2020 CLINICAL DATA:  Fall EXAM: LUMBAR SPINE - 2-3 VIEW COMPARISON:  12/11/2019 FINDINGS: Moderate compression fracture involving, stable since prior the L3 vertebral body study. Diffuse degenerative disc and facet disease. No acute fracture. Normal alignment. IMPRESSION: Stable chronic L3 compression fracture. Spondylosis. No acute findings. Electronically Signed   By: Rolm Baptise M.D.   On: 04/15/2020 19:02   CT Head Wo  Contrast  Result Date: 04/15/2020 CLINICAL DATA:  Fall, landing on her bottom. Denies hitting her head. No neck pain. EXAM: CT HEAD WITHOUT CONTRAST CT CERVICAL SPINE WITHOUT CONTRAST TECHNIQUE: Multidetector CT imaging of the head and cervical spine was performed following the standard protocol without intravenous contrast.  Multiplanar CT image reconstructions of the cervical spine were also generated. COMPARISON:  CT head and cervical spine dated March 19, 2020. FINDINGS: CT HEAD FINDINGS Brain: No evidence of acute infarction, hemorrhage, hydrocephalus, extra-axial collection or mass lesion/mass effect. Stable atrophy and chronic microvascular ischemic changes. Vascular: Calcified atherosclerosis at the skullbase. No hyperdense vessel. Skull: Normal. Negative for fracture or focal lesion. Sinuses/Orbits: No acute finding. Other: None. CT CERVICAL SPINE FINDINGS Alignment: No traumatic malalignment. Unchanged trace anterolisthesis at C3-C4 and C4-C5. Skull base and vertebrae: No acute fracture. No primary bone lesion or focal pathologic process. Soft tissues and spinal canal: No prevertebral fluid or swelling. No visible canal hematoma. Disc levels: Unchanged moderate disc height loss and uncovertebral hypertrophy at C5-C6 and C6-C7. Unchanged diffuse facet arthropathy, advanced on the left at C3-C4 and C4-C5. Upper chest: Negative. Other: 1.3 cm hypodense nodule in the right thyroid gland. Not clinically significant; no follow-up imaging recommended. IMPRESSION: 1. No acute intracranial abnormality. Stable atrophy and chronic microvascular ischemic changes. 2. No acute cervical spine fracture or traumatic listhesis. 3. Unchanged moderate cervical spondylosis. Electronically Signed   By: Titus Dubin M.D.   On: 04/15/2020 18:34   CT Cervical Spine Wo Contrast  Result Date: 04/15/2020 CLINICAL DATA:  Fall, landing on her bottom. Denies hitting her head. No neck pain. EXAM: CT HEAD WITHOUT CONTRAST CT CERVICAL SPINE WITHOUT CONTRAST TECHNIQUE: Multidetector CT imaging of the head and cervical spine was performed following the standard protocol without intravenous contrast. Multiplanar CT image reconstructions of the cervical spine were also generated. COMPARISON:  CT head and cervical spine dated March 19, 2020. FINDINGS: CT  HEAD FINDINGS Brain: No evidence of acute infarction, hemorrhage, hydrocephalus, extra-axial collection or mass lesion/mass effect. Stable atrophy and chronic microvascular ischemic changes. Vascular: Calcified atherosclerosis at the skullbase. No hyperdense vessel. Skull: Normal. Negative for fracture or focal lesion. Sinuses/Orbits: No acute finding. Other: None. CT CERVICAL SPINE FINDINGS Alignment: No traumatic malalignment. Unchanged trace anterolisthesis at C3-C4 and C4-C5. Skull base and vertebrae: No acute fracture. No primary bone lesion or focal pathologic process. Soft tissues and spinal canal: No prevertebral fluid or swelling. No visible canal hematoma. Disc levels: Unchanged moderate disc height loss and uncovertebral hypertrophy at C5-C6 and C6-C7. Unchanged diffuse facet arthropathy, advanced on the left at C3-C4 and C4-C5. Upper chest: Negative. Other: 1.3 cm hypodense nodule in the right thyroid gland. Not clinically significant; no follow-up imaging recommended. IMPRESSION: 1. No acute intracranial abnormality. Stable atrophy and chronic microvascular ischemic changes. 2. No acute cervical spine fracture or traumatic listhesis. 3. Unchanged moderate cervical spondylosis. Electronically Signed   By: Titus Dubin M.D.   On: 04/15/2020 18:34   DG Hip Unilat W or Wo Pelvis 2-3 Views Right  Result Date: 04/15/2020 CLINICAL DATA:  Fall EXAM: DG HIP (WITH OR WITHOUT PELVIS) 2-3V RIGHT COMPARISON:  None. FINDINGS: Fracture through the right superior and inferior pubic rami. The superior pubic ramus fracture is near the medial wall of the right acetabulum. No proximal femoral fracture, subluxation or dislocation. Mild degenerative changes in the hips. IMPRESSION: Right superior and inferior pubic rami fractures. Electronically Signed   By: Rolm Baptise M.D.   On: 04/15/2020 19:03  ASSESSMENT AND PLAN:  Anita Ann R Walker is a 80 y.o. female with medical history significant for hypertension, at risk  for falls, hyperlipidemia, non-insulin-dependent diabetes mellitus, diabetic polyneuropathy, history of left breast cancer status post lumpectomy and radiation and hormonal therapy approximately 30 years ago, DVT of the lower extremity, pulmonary embolism, on chronic anticoagulation with Coumadin, presents to the emergency department for chief concerns of a fall.  Right superior and inferior pubic rami fracture secondary to mechanical fall -Present on admission -Pain control with IV morphine as needed for severe pain, tramadol as needed for moderate pain, acetaminophen as needed for mild pain -TOC, PT, OT consulted -Fall precautions  Hypertension-amlodipine ,losartan   Hyperlipidemia-simvastatin ]  History of DVT and pulmonary embolism -resumed home warfarin 2.5 mg daily -Warfarin per pharmacy --discussed at length with patient's daughter on the phone. Patient has been on warfarin for more than 10 years for history of DVT and PE. She is been having frequent falls according to the daughter. I mentioned to the daughter to discuss with Dr. Einar Pheasant regarding continuation of Coumadin given frequent falls.  GERD-resumed home PPI   history of left breast cancer status post lumpectomy, radiation, hormonal therapy for 5 years.   DVT prophylaxis: Warfarin Code Status: DNR Diet: Heart healthy Family Communication: discussed with daughter on the phone, Cala [place] Disposition Plan: Pending clinical course and PT evaluation Consults called: TOC for SNF placement, PT, OT  Level of care: Med-Surg Status is: Observation        TOTAL TIME TAKING CARE OF THIS PATIENT: 25 minutes.  >50% time spent on counselling and coordination of care  Note: This dictation was prepared with Dragon dictation along with smaller phrase technology. Any transcriptional errors that result from this process are unintentional.  Norville Haggard.   [facility]   CC: Primary care physician;  Einar Pheasant, MDPatient ID: Anita Ann R Walker, female   DOB: 08-26-1940, 80 y.o.   MRN: [id number]

## 2020-04-17 NOTE — Progress Notes (Signed)
ANTICOAGULATION CONSULT NOTE   Pharmacy Consult for Warfarin Dosing Indication: VTE prophylaxis  Patient Measurements: Height: 5\' 9"  (175.3 cm) Weight: 97.5 kg (215 lb) IBW/kg (Calculated) : 66.2  Vital Signs: Temp: 97.8 F (36.6 C) (02/12 0739) Temp Source: Oral (02/12 0739) BP: 135/74 (02/12 0739) Pulse Rate: 87 (02/12 0739)  Labs: Recent Labs    04/15/20 1915 04/15/20 2020 04/16/20 0606 04/17/20 0441  HGB 12.1  --  11.3*  --   HCT 36.2  --  33.9*  --   PLT 234  --  215  --   LABPROT 21.8*  --  22.5* 25.2*  INR 2.0*  --  2.1* 2.4*  CREATININE  --  0.69 0.62  --     Estimated Creatinine Clearance: 67.4 mL/min (by C-G formula based on SCr of 0.62 mg/dL).   Medical History: Past Medical History:  Diagnosis Date  . Anemia   . Arthritis   . Atrial fibrillation ([place])   . Breast cancer ([place])    s/p lumpectomy 1992.  s/p chemo and xrt left breast  . Diabetes mellitus ([place])   . Edema    feet/legs  . Gastric ulcer   . GERD (gastroesophageal reflux disease)   . HOH (hard of hearing)    aides  . Hypercholesterolemia   . Hypertension   . Personal history of chemotherapy   . Pulmonary emboli ([facility])     Medications:  Pt on warfarin PTA, taking 2.5 mg and 5 mg alternating days.  Pt last dose was on 04/14/20 but family member not sure if pt last took 2.5 or 5 mg dose.  Assessment: Pt is 80 yo female with med hx including DVT of the lower extremity, pulmonary embolism, on chronic anticoagulation.  Pt home warfarin regimen weekly total dose of 26.25  Mg (3.75 mg/day). H&H trending down slightly, platelets WNL  Goal of Therapy:  INR 2-3 Monitor H&H   Plan:   INR therapeutic: warfarin 2.5mg  x 1 dose today, which is equal to her home dosage  Daily INR checks until stable  Vallery Sa, PharmD, BCPS 04/17/2020 8:16 AM

## 2020-04-18 DIAGNOSIS — W19XXXA Unspecified fall, initial encounter: Secondary | ICD-10-CM | POA: Diagnosis not present

## 2020-04-18 DIAGNOSIS — E1142 Type 2 diabetes mellitus with diabetic polyneuropathy: Secondary | ICD-10-CM | POA: Diagnosis not present

## 2020-04-18 DIAGNOSIS — S32591A Other specified fracture of right pubis, initial encounter for closed fracture: Secondary | ICD-10-CM | POA: Diagnosis not present

## 2020-04-18 DIAGNOSIS — M545 Low back pain, unspecified: Secondary | ICD-10-CM | POA: Diagnosis not present

## 2020-04-18 LAB — PROTIME-INR
INR: 2.1 — ABNORMAL HIGH (ref 0.8–1.2)
Prothrombin Time: 23 seconds — ABNORMAL HIGH (ref 11.4–15.2)

## 2020-04-18 MED ORDER — WARFARIN SODIUM 5 MG PO TABS
5.0000 mg | ORAL_TABLET | Freq: Once | ORAL | Status: AC
  Administered 2020-04-18: 5 mg via ORAL
  Filled 2020-04-18: qty 1

## 2020-04-18 NOTE — Progress Notes (Signed)
[place] at [place] NAME: Anita Walker    MR#:  [id number]  DATE OF BIRTH:  [birth year]  SUBJECTIVE:   Patient came in with mechanical fall at home. Sustained pelvic fracture.   REVIEW OF SYSTEMS:   Review of Systems  Constitutional: Negative for chills, fever and weight loss.  HENT: Negative for ear discharge, ear pain and nosebleeds.   Eyes: Negative for blurred vision, pain and discharge.  Respiratory: Negative for sputum production, shortness of breath, wheezing and stridor.   Cardiovascular: Negative for chest pain, palpitations, orthopnea and PND.  Gastrointestinal: Negative for abdominal pain, diarrhea, nausea and vomiting.  Genitourinary: Negative for frequency and urgency.  Musculoskeletal: Positive for falls, joint pain and neck pain. Negative for back pain.  Neurological: Positive for weakness. Negative for sensory change, speech change and focal weakness.  Psychiatric/Behavioral: Negative for depression and hallucinations. The patient is not nervous/anxious.    Tolerating Diet:yes Tolerating PT: SNF  DRUG ALLERGIES:   Allergies  Allergen Reactions  . Penicillins Other (See Comments)    Unknown- pt states been a long time ago  Has patient had a PCN reaction causing immediate rash, facial/tongue/throat swelling, SOB or lightheadedness with hypotension: Unknown Has patient had a PCN reaction causing severe rash involving mucus membranes or skin necrosis: Unknown Has patient had a PCN reaction that required hospitalization: Unknown Has patient had a PCN reaction occurring within the last 10 years: Unknown If all of the above answers are "NO", then may proceed with Cephalosporin use.  Marland Kitchen Penicillin V Potassium Nausea And Vomiting    VITALS:  Blood pressure (!) 109/56, pulse 91, temperature 98 F (36.7 C), temperature source Oral, resp. rate 16, height 5\' 9"  (1.753 m), weight 97.5 kg, SpO2 94 %.  PHYSICAL EXAMINATION:    Physical Exam  GENERAL:  80 y.o.-year-old patient lying in the bed with no acute distress.  LUNGS: Normal breath sounds bilaterally, no wheezing, rales, rhonchi. No use of accessory muscles of respiration.  CARDIOVASCULAR: S1, S2 normal. No murmurs, rubs, or gallops.  ABDOMEN: Soft, nontender, nondistended. Bowel sounds present. No organomegaly or mass.  EXTREMITIES: decreased range of motion of the hip joint due to pelvic fracture NEUROLOGIC: Cranial nerves II through XII are intact. No focal Motor or sensory deficits b/l.  gen weakness PSYCHIATRIC:  patient is alert and oriented x 2.  SKIN: No obvious rash, lesion, or ulcer.   LABORATORY PANEL:  CBC Recent Labs  Lab 04/16/20 0606  WBC 8.2  HGB 11.3*  HCT 33.9*  PLT 215    Chemistries  Recent Labs  Lab 04/16/20 0606  NA 138  K 4.3  CL 102  CO2 26  GLUCOSE 112*  BUN 10  CREATININE 0.62  CALCIUM 9.0   Cardiac Enzymes No results for input(s): TROPONINI in the last 168 hours. RADIOLOGY:  No results found. ASSESSMENT AND PLAN:  Anita Ann R Walker is a 80 y.o. female with medical history significant for hypertension, at risk for falls, hyperlipidemia, non-insulin-dependent diabetes mellitus, diabetic polyneuropathy, history of left breast cancer status post lumpectomy and radiation and hormonal therapy approximately 30 years ago, DVT of the lower extremity, pulmonary embolism, on chronic anticoagulation with Coumadin, presents to the emergency department for chief concerns of a fall.  Right superior and inferior pubic rami fracture secondary to mechanical fall -Present on admission -Pain control with IV morphine as needed for severe pain, tramadol as needed for moderate pain, acetaminophen as needed for  mild pain - PT, OT evaluation noted -Fall precautions  Hypertension-amlodipine ,losartan   Hyperlipidemia-simvastatin ]  History of DVT and pulmonary embolism -resumed home warfarin 2.5 mg daily -Warfarin per  pharmacy --discussed at length with patient's daughter on the phone. Patient has been on warfarin for more than 10 years for history of DVT and PE. She is been having frequent falls according to the daughter. I mentioned to the daughter to discuss with Dr. Einar Pheasant regarding continuation of Coumadin given frequent falls.  GERD-resumed home PPI   history of left breast cancer status post lumpectomy, radiation, hormonal therapy for 5 years.   DVT prophylaxis: Warfarin Code Status: DNR Diet: Heart healthy Family Communication: discussed with daughter on the phone, Anita Walker Disposition Plan: to [facility] 2/14 Consults called: none  Level of care: Med-Surg Status is: inpateint        TOTAL TIME TAKING CARE OF THIS PATIENT: 25 minutes.  >50% time spent on counselling and coordination of care  Note: This dictation was prepared with Dragon dictation along with smaller phrase technology. Any transcriptional errors that result from this process are unintentional.  Norville Haggard.   [facility]   CC: Primary care physician; Einar Pheasant, MDPatient ID: Anita Ann R Walker, female   DOB: [birth year], 80 y.o.   MRN: [id number]

## 2020-04-18 NOTE — TOC Progression Note (Signed)
Transition of Care [facility]) - Progression Note    Patient Details  Name: Anita Walker MRN: [id number] Date of Birth: [birth year]  Transition of Care [facility]) CM/SW Contact  Boris Sharper, [place] Phone Number: 04/18/2020, 12:30 PM  Clinical Narrative:    CSW provided pt's daughter with bed offers and she chose [facility]. CSW contacted [facility] to see if they could accept today. Tanya notified CSW that they are unable to accept pt today but can accept herr tomorrow. CSW notified MD and requested a Covid for pt to transfer tomorrow.    Expected Discharge Plan: [place] Barriers to Discharge: No Barriers Identified  Expected Discharge Plan and Services Expected Discharge Plan: [place] arrangements for the past 2 months: Single Family Home                                       Social Determinants of Health (SDOH) Interventions    Readmission Risk Interventions No flowsheet data found.

## 2020-04-18 NOTE — Progress Notes (Signed)
ANTICOAGULATION CONSULT NOTE   Pharmacy Consult for Warfarin Dosing Indication: VTE prophylaxis  Patient Measurements: Height: 5\' 9"  (175.3 cm) Weight: 97.5 kg (215 lb) IBW/kg (Calculated) : 66.2  Vital Signs: Temp: 98.8 F (37.1 C) (02/13 0356) Temp Source: Oral (02/13 0356) BP: 114/57 (02/13 0356) Pulse Rate: 96 (02/13 0356)  Labs: Recent Labs    04/15/20 1915 04/15/20 2020 04/16/20 0606 04/17/20 0441 04/18/20 0544  HGB 12.1  --  11.3*  --   --   HCT 36.2  --  33.9*  --   --   PLT 234  --  215  --   --   LABPROT 21.8*  --  22.5* 25.2* 23.0*  INR 2.0*  --  2.1* 2.4* 2.1*  CREATININE  --  0.69 0.62  --   --     Estimated Creatinine Clearance: 67.4 mL/min (by C-G formula based on SCr of 0.62 mg/dL).   Medical History: Past Medical History:  Diagnosis Date  . Anemia   . Arthritis   . Atrial fibrillation ([place])   . Breast cancer ([place])    s/p lumpectomy 1992.  s/p chemo and xrt left breast  . Diabetes mellitus ([place])   . Edema    feet/legs  . Gastric ulcer   . GERD (gastroesophageal reflux disease)   . HOH (hard of hearing)    aides  . Hypercholesterolemia   . Hypertension   . Personal history of chemotherapy   . Pulmonary emboli ([facility])     Medications:  Pt on warfarin PTA, taking 2.5 mg and 5 mg alternating days.  Pt last dose was on 04/14/20 but family member not sure if pt last took 2.5 or 5 mg dose.  2/12 INR 2.4 2.5 mg 2/13 INR 2.1  Assessment: Pt is 80 yo female with med hx including DVT of the lower extremity, pulmonary embolism, on chronic anticoagulation.  Pt home warfarin regimen weekly total dose of 26.25  Mg (3.75 mg/day). H&H trending down slightly, platelets WNL  Goal of Therapy:  INR 2-3 Monitor H&H   Plan:   INR therapeutic: warfarin 5 mg x 1 dose today, according to her home dosage  Daily INR checks until stable  Chinita Greenland PharmD Clinical Pharmacist 04/18/2020

## 2020-04-19 ENCOUNTER — Other Ambulatory Visit: Payer: Medicare Other

## 2020-04-19 ENCOUNTER — Telehealth: Payer: Self-pay | Admitting: Internal Medicine

## 2020-04-19 DIAGNOSIS — R0902 Hypoxemia: Secondary | ICD-10-CM | POA: Diagnosis not present

## 2020-04-19 DIAGNOSIS — M5136 Other intervertebral disc degeneration, lumbar region: Secondary | ICD-10-CM | POA: Diagnosis not present

## 2020-04-19 DIAGNOSIS — F4320 Adjustment disorder, unspecified: Secondary | ICD-10-CM | POA: Diagnosis not present

## 2020-04-19 DIAGNOSIS — R0981 Nasal congestion: Secondary | ICD-10-CM | POA: Diagnosis not present

## 2020-04-19 DIAGNOSIS — R278 Other lack of coordination: Secondary | ICD-10-CM | POA: Diagnosis not present

## 2020-04-19 DIAGNOSIS — R202 Paresthesia of skin: Secondary | ICD-10-CM | POA: Diagnosis not present

## 2020-04-19 DIAGNOSIS — J019 Acute sinusitis, unspecified: Secondary | ICD-10-CM | POA: Diagnosis not present

## 2020-04-19 DIAGNOSIS — I82403 Acute embolism and thrombosis of unspecified deep veins of lower extremity, bilateral: Secondary | ICD-10-CM | POA: Diagnosis not present

## 2020-04-19 DIAGNOSIS — M79661 Pain in right lower leg: Secondary | ICD-10-CM | POA: Diagnosis not present

## 2020-04-19 DIAGNOSIS — M4802 Spinal stenosis, cervical region: Secondary | ICD-10-CM | POA: Diagnosis not present

## 2020-04-19 DIAGNOSIS — G311 Senile degeneration of brain, not elsewhere classified: Secondary | ICD-10-CM | POA: Diagnosis not present

## 2020-04-19 DIAGNOSIS — S32591A Other specified fracture of right pubis, initial encounter for closed fracture: Secondary | ICD-10-CM | POA: Diagnosis not present

## 2020-04-19 DIAGNOSIS — Z1159 Encounter for screening for other viral diseases: Secondary | ICD-10-CM | POA: Diagnosis not present

## 2020-04-19 DIAGNOSIS — R5381 Other malaise: Secondary | ICD-10-CM | POA: Diagnosis not present

## 2020-04-19 DIAGNOSIS — I10 Essential (primary) hypertension: Secondary | ICD-10-CM | POA: Diagnosis not present

## 2020-04-19 DIAGNOSIS — M25551 Pain in right hip: Secondary | ICD-10-CM | POA: Diagnosis not present

## 2020-04-19 DIAGNOSIS — Z7901 Long term (current) use of anticoagulants: Secondary | ICD-10-CM | POA: Diagnosis not present

## 2020-04-19 DIAGNOSIS — R059 Cough, unspecified: Secondary | ICD-10-CM | POA: Diagnosis not present

## 2020-04-19 DIAGNOSIS — Z72821 Inadequate sleep hygiene: Secondary | ICD-10-CM | POA: Diagnosis not present

## 2020-04-19 DIAGNOSIS — S32501A Unspecified fracture of right pubis, initial encounter for closed fracture: Secondary | ICD-10-CM | POA: Diagnosis not present

## 2020-04-19 DIAGNOSIS — E1169 Type 2 diabetes mellitus with other specified complication: Secondary | ICD-10-CM | POA: Diagnosis not present

## 2020-04-19 DIAGNOSIS — R262 Difficulty in walking, not elsewhere classified: Secondary | ICD-10-CM | POA: Diagnosis not present

## 2020-04-19 DIAGNOSIS — R279 Unspecified lack of coordination: Secondary | ICD-10-CM | POA: Diagnosis not present

## 2020-04-19 DIAGNOSIS — S32501D Unspecified fracture of right pubis, subsequent encounter for fracture with routine healing: Secondary | ICD-10-CM | POA: Diagnosis not present

## 2020-04-19 DIAGNOSIS — J439 Emphysema, unspecified: Secondary | ICD-10-CM | POA: Diagnosis not present

## 2020-04-19 DIAGNOSIS — R1030 Lower abdominal pain, unspecified: Secondary | ICD-10-CM | POA: Diagnosis not present

## 2020-04-19 DIAGNOSIS — E1142 Type 2 diabetes mellitus with diabetic polyneuropathy: Secondary | ICD-10-CM | POA: Diagnosis not present

## 2020-04-19 DIAGNOSIS — M79662 Pain in left lower leg: Secondary | ICD-10-CM | POA: Diagnosis not present

## 2020-04-19 DIAGNOSIS — M4820 Kissing spine, site unspecified: Secondary | ICD-10-CM | POA: Diagnosis not present

## 2020-04-19 DIAGNOSIS — M545 Low back pain, unspecified: Secondary | ICD-10-CM | POA: Diagnosis not present

## 2020-04-19 DIAGNOSIS — J302 Other seasonal allergic rhinitis: Secondary | ICD-10-CM | POA: Diagnosis not present

## 2020-04-19 DIAGNOSIS — E785 Hyperlipidemia, unspecified: Secondary | ICD-10-CM | POA: Diagnosis not present

## 2020-04-19 DIAGNOSIS — W19XXXA Unspecified fall, initial encounter: Secondary | ICD-10-CM | POA: Diagnosis not present

## 2020-04-19 DIAGNOSIS — M6281 Muscle weakness (generalized): Secondary | ICD-10-CM | POA: Diagnosis not present

## 2020-04-19 LAB — PROTIME-INR
INR: 2.1 — ABNORMAL HIGH (ref 0.8–1.2)
Prothrombin Time: 22.7 seconds — ABNORMAL HIGH (ref 11.4–15.2)

## 2020-04-19 LAB — RESP PANEL BY RT-PCR (FLU A&B, COVID) ARPGX2
Influenza A by PCR: NEGATIVE
Influenza B by PCR: NEGATIVE
SARS Coronavirus 2 by RT PCR: NEGATIVE

## 2020-04-19 MED ORDER — ENSURE ENLIVE PO LIQD: 237.0000 mL | Freq: Two times a day (BID) | ORAL | 12 refills | Status: AC

## 2020-04-19 MED ORDER — TRAMADOL HCL 50 MG PO TABS: 50.0000 mg | ORAL_TABLET | Freq: Three times a day (TID) | ORAL | 0 refills | Status: DC | PRN

## 2020-04-19 MED ORDER — ACETAMINOPHEN 325 MG PO TABS
650.0000 mg | ORAL_TABLET | Freq: Four times a day (QID) | ORAL | Status: DC | PRN
  Filled 2020-04-19: qty 2

## 2020-04-19 MED ORDER — WARFARIN SODIUM 2.5 MG PO TABS
2.5000 mg | ORAL_TABLET | Freq: Once | ORAL | Status: AC
  Administered 2020-04-19: 2.5 mg via ORAL
  Filled 2020-04-19: qty 1

## 2020-04-19 MED ORDER — ACETAMINOPHEN 325 MG PO TABS: 650.0000 mg | ORAL_TABLET | Freq: Four times a day (QID) | ORAL | 0 refills | Status: DC | PRN

## 2020-04-19 NOTE — Discharge Summary (Signed)
[facility] NAME: Anita Walker    MR#:  [id number]  DATE OF BIRTH:  04-Oct-1940  DATE OF ADMISSION:  04/15/2020 ADMITTING PHYSICIAN: Fritzi Mandes, MD  DATE OF DISCHARGE: 04/19/2020  PRIMARY CARE PHYSICIAN: Einar Pheasant, MD    ADMISSION DIAGNOSIS:  Pelvic fracture ([place]) [S32.9XXA] Fall, initial encounter [id number]] Closed fracture of multiple pubic rami, right, initial encounter ([place]) [S32.591A]  DISCHARGE DIAGNOSIS:  Right Pelvic fracture post mechanical fall  SECONDARY DIAGNOSIS:   Past Medical History:  Diagnosis Date  . Anemia   . Arthritis   . Atrial fibrillation ([place])   . Breast cancer ([place])    s/p lumpectomy 1992.  s/p chemo and xrt left breast  . Diabetes mellitus ([place])   . Edema    feet/legs  . Gastric ulcer   . GERD (gastroesophageal reflux disease)   . HOH (hard of hearing)    aides  . Hypercholesterolemia   . Hypertension   . Personal history of chemotherapy   . Pulmonary emboli [facility])     HOSPITAL COURSE:   Nikkie Liming a 80 y.o.femalewith medical history significant forhypertension, at risk for falls, hyperlipidemia, non-insulin-dependent diabetes mellitus, diabetic polyneuropathy, history of left breast cancer status post lumpectomy and radiation and hormonal therapy approximately 30 years ago, DVT of the lower extremity, pulmonary embolism,on chronic anticoagulation with Coumadin, presents to the emergency department for chief concerns of a fall.  Right superior and inferior pubic rami fracture secondary to mechanical fall -Present on admission -Pain control with IV morphine as needed for severe pain, tramadol as needed for moderate pain, acetaminophen as needed for mild pain - PT, OT evaluation noted -Fall precautions  Hypertension-amlodipine ,losartan   Hyperlipidemia-simvastatin  History of DVT and pulmonary embolism-resumed home warfarin 2.5 mg daily -Warfarin per  pharmacy--INR 2.1 --discussed at length with patient's daughter on the phone. Patient has been on warfarin for more than 10 years for history of DVT and PE. She is been having frequent falls according to the daughter. I mentioned to the daughter to discuss with Dr. Einar Pheasant regarding continuation of Coumadin given frequent falls.  GERD-resumed home PPI  History of left breast cancer status post lumpectomy, radiation, hormonal therapy for 5 years.   DVT prophylaxis:Warfarin Code Status:DNR Diet:Heart healthy Family Communication:discussed with daughter on the phone, Cala Bradford 2/13 Disposition Plan:to [facility] 2/14 Consults called:none  Level of care: Med-Surg Status is: inpateint  overall optimized for d/c to rehab  CONSULTS OBTAINED:    DRUG ALLERGIES:   Allergies  Allergen Reactions  . Penicillins Other (See Comments)    Unknown- pt states been a long time ago  Has patient had a PCN reaction causing immediate rash, facial/tongue/throat swelling, SOB or lightheadedness with hypotension: Unknown Has patient had a PCN reaction causing severe rash involving mucus membranes or skin necrosis: Unknown Has patient had a PCN reaction that required hospitalization: Unknown Has patient had a PCN reaction occurring within the last 10 years: Unknown If all of the above answers are "NO", then may proceed with Cephalosporin use.  Marland Kitchen Penicillin V Potassium Nausea And Vomiting    DISCHARGE MEDICATIONS:   Allergies as of 04/19/2020      Reactions   Penicillins Other (See Comments)   Unknown- pt states been a long time ago  Has patient had a PCN reaction causing immediate rash, facial/tongue/throat swelling, SOB or lightheadedness with hypotension: Unknown Has patient had a PCN reaction causing severe rash involving  mucus membranes or skin necrosis: Unknown Has patient had a PCN reaction that required hospitalization: Unknown Has patient had a PCN reaction occurring  within the last 10 years: Unknown If all of the above answers are "NO", then may proceed with Cephalosporin use.   Penicillin V Potassium Nausea And Vomiting      Medication List    STOP taking these medications   hydrocortisone 2.5 % ointment     TAKE these medications   acetaminophen 325 MG tablet Commonly known as: TYLENOL Take 2 tablets (650 mg total) by mouth every 6 (six) hours as needed for moderate pain (headache).   amLODipine 2.5 MG tablet Commonly known as: NORVASC TAKE 1 TABLET(2.5 MG) BY MOUTH DAILY   Contour Test test strip Generic drug: glucose blood USE TO TEST BLOOD SUGAR ONCE EVERY WEEK   diphenhydrAMINE 25 mg capsule Commonly known as: BENADRYL Take 25 mg at bedtime as needed by mouth for sleep.   feeding supplement Liqd Take 237 mLs by mouth 2 (two) times daily between meals.   FeroSul 325 (65 FE) MG tablet Generic drug: ferrous sulfate TAKE 1 TABLET(325 MG) BY MOUTH DAILY   losartan 100 MG tablet Commonly known as: COZAAR TAKE 1 TABLET(100 MG) BY MOUTH DAILY   multivitamin with minerals Tabs tablet Take 1 tablet daily by mouth.   pantoprazole 40 MG tablet Commonly known as: PROTONIX TAKE 1 TABLET(40 MG) BY MOUTH DAILY   simvastatin 10 MG tablet Commonly known as: ZOCOR TAKE 1 TABLET BY MOUTH EVERY DAY   traMADol 50 MG tablet Commonly known as: Ultram Take 1 tablet (50 mg total) by mouth every 8 (eight) hours as needed.   warfarin 2.5 MG tablet Commonly known as: COUMADIN Take 2.5-5 mg by mouth daily at 4 PM. Takes 2.5 mg and 5 mg alternating days.       If you experience worsening of your admission symptoms, develop shortness of breath, life threatening emergency, suicidal or homicidal thoughts you must seek medical attention immediately by calling 911 or calling your MD immediately  if symptoms less severe.  You Must read complete instructions/literature along with all the possible adverse reactions/side effects for all the  Medicines you take and that have been prescribed to you. Take any new Medicines after you have completely understood and accept all the possible adverse reactions/side effects.   Please note  You were cared for by a hospitalist during your hospital stay. If you have any questions about your discharge medications or the care you received while you were in the hospital after you are discharged, you can call the unit and asked to speak with the hospitalist on call if the hospitalist that took care of you is not available. Once you are discharged, your primary care physician will handle any further medical issues. Please note that NO REFILLS for any discharge medications will be authorized once you are discharged, as it is imperative that you return to your primary care physician (or establish a relationship with a primary care physician if you do not have one) for your aftercare needs so that they can reassess your need for medications and monitor your lab values. Today   SUBJECTIVE   My hip pain is mild  VITAL SIGNS:  Blood pressure 124/77, pulse 78, temperature 97.8 F (36.6 C), resp. rate 17, height 5\' 9"  (1.753 m), weight 97.5 kg, SpO2 93 %.  I/O:    Intake/Output Summary (Last 24 hours) at 04/19/2020 0817 Last data filed at 04/19/2020 [phone]  Gross per 24 hour  Intake 240 ml  Output 3750 ml  Net -3510 ml    PHYSICAL EXAMINATION:  GENERAL:  80 y.o.-year-old patient lying in the bed with no acute distress.  LUNGS: Normal breath sounds bilaterally, no wheezing, rales,rhonchi or crepitation. No use of accessory muscles of respiration.  CARDIOVASCULAR: S1, S2 normal. No murmurs, rubs, or gallops.  ABDOMEN: Soft, non-tender, non-distended. Bowel sounds present. No organomegaly or mass.  EXTREMITIES: No pedal edema, cyanosis, or clubbing.  NEUROLOGIC:non focal PSYCHIATRIC:  patient is alert and oriented x 2.  SKIN: No obvious rash, lesion, or ulcer.   DATA REVIEW:   CBC  Recent Labs   Lab 04/16/20 0606  WBC 8.2  HGB 11.3*  HCT 33.9*  PLT 215    Chemistries  Recent Labs  Lab 04/16/20 0606  NA 138  K 4.3  CL 102  CO2 26  GLUCOSE 112*  BUN 10  CREATININE 0.62  CALCIUM 9.0    Microbiology Results   Recent Results (from the past 240 hour(s))  SARS CORONAVIRUS 2 (TAT 6-24 HRS) Nasopharyngeal Nasopharyngeal Swab     Status: None   Collection Time: 04/15/20  8:20 PM   Specimen: Nasopharyngeal Swab  Result Value Ref Range Status   SARS Coronavirus 2 NEGATIVE NEGATIVE Final    Comment: (NOTE) SARS-CoV-2 target nucleic acids are NOT DETECTED.  The SARS-CoV-2 RNA is generally detectable in upper and lower respiratory specimens during the acute phase of infection. Negative results do not preclude SARS-CoV-2 infection, do not rule out co-infections with other pathogens, and should not be used as the sole basis for treatment or other patient management decisions. Negative results must be combined with clinical observations, patient history, and epidemiological information. The expected result is Negative.  Fact Sheet for Patients: SugarRoll.be  Fact Sheet for Healthcare Providers: https://www.woods-mathews.com/  This test is not yet approved or cleared by the [place] FDA and  has been authorized for detection and/or diagnosis of SARS-CoV-2 by FDA under an Emergency Use Authorization (EUA). This EUA will remain  in effect (meaning this test can be used) for the duration of the COVID-19 declaration under Se ction 564(b)(1) of the Act, 21 U.S.C. section 360bbb-3(b)(1), unless the authorization is terminated or revoked sooner.  Performed at [facility], [place] [address] [place], [place] [address]     RADIOLOGY:  No results found.   CODE STATUS:     Code Status Orders  (From admission, onward)         Start     Ordered   04/15/20 2146  Do not attempt resuscitation (DNR)  Continuous        Question Answer Comment  In the event of cardiac or respiratory ARREST Do not call a "code blue"   In the event of cardiac or respiratory ARREST Do not perform Intubation, CPR, defibrillation or ACLS   In the event of cardiac or respiratory ARREST Use medication by any route, position, wound care, and other measures to relive pain and suffering. May use oxygen, suction and manual treatment of airway obstruction as needed for comfort.      04/15/20 2147        Code Status History    Date Active Date Inactive Code Status Order ID Comments User Context   10/30/2016 2011 11/02/2016 1741 Full Code [id number]  Dustin Flock, MD Inpatient   Advance Care Planning Activity       TOTAL TIME TAKING CARE OF THIS PATIENT: *35* minutes.  Fritzi Mandes M.D  [facility]    CC: Primary care physician; Einar Pheasant, MD

## 2020-04-19 NOTE — Progress Notes (Signed)
ANTICOAGULATION CONSULT NOTE   Pharmacy Consult for Warfarin Dosing Indication: VTE prophylaxis  Patient Measurements: Height: 5\' 9"  (175.3 cm) Weight: 97.5 kg (215 lb) IBW/kg (Calculated) : 66.2  Vital Signs: Temp: 97.6 F (36.4 C) (02/14 0832) BP: 131/59 (02/14 0832) Pulse Rate: 80 (02/14 0832)  Labs: Recent Labs    04/17/20 0441 04/18/20 0544 04/19/20 0526  LABPROT 25.2* 23.0* 22.7*  INR 2.4* 2.1* 2.1*    Estimated Creatinine Clearance: 67.4 mL/min (by C-G formula based on SCr of 0.62 mg/dL).   Medical History: Past Medical History:  Diagnosis Date  . Anemia   . Arthritis   . Atrial fibrillation ([place])   . Breast cancer ([place])    s/p lumpectomy 1992.  s/p chemo and xrt left breast  . Diabetes mellitus ([place])   . Edema    feet/legs  . Gastric ulcer   . GERD (gastroesophageal reflux disease)   . HOH (hard of hearing)    aides  . Hypercholesterolemia   . Hypertension   . Personal history of chemotherapy   . Pulmonary emboli ([facility])     Medications:  Pt on warfarin PTA, taking 2.5 mg and 5 mg alternating days.  Pt last dose was on 04/14/20 but family member not sure if pt last took 2.5 or 5 mg dose.  2/12 INR 2.4 2.5 mg 2/13 INR 2.1 5mg  2/14 INR 2.1  Assessment: Pt is 80 yo female with med hx including DVT of the lower extremity, pulmonary embolism, on chronic anticoagulation.  Pt home warfarin regimen weekly total dose of 26.25  Mg (3.75 mg/day). H&H trending down slightly, platelets WNL  Goal of Therapy:  INR 2-3 Monitor H&H   Plan:   INR therapeutic: warfarin 2.5 mg x 1 dose today, according to her home dosage (if patient has not discharged by 1600)  Daily INR checks until stable, CBC tomorrow if still here  Lu Duffel, PharmD, BCPS Clinical Pharmacist 04/19/2020 9:13 AM

## 2020-04-19 NOTE — Telephone Encounter (Signed)
[place] called to set up hospital follow up for patient on schedule for 04-28-20 at 12:00

## 2020-04-19 NOTE — Care Management Important Message (Signed)
Important Message  Patient Details  Name: Jo Ann R Martinique MRN: [id number] Date of Birth: [birth year]   Medicare Important Message Given:  Yes  Attempted to review Medicare IM with patient over room phone and cell, though patient having a hard time hearing.  Spoke with daughter, Andria Meuse, and reviewed upon patient's request.  Copy of Medicare IM to be sent securely to daughter's email address provided: [email or web address].    Dannette Barbara 04/19/2020, 1:06 PM

## 2020-04-19 NOTE — Progress Notes (Addendum)
[facility] arrived at [facility] to transport pt to [facility]. Pt D/ced with hearing aids/hearing aids charger. Flipped cell phone and also Pensions consultant.

## 2020-04-19 NOTE — Telephone Encounter (Signed)
Noted  

## 2020-04-19 NOTE — Telephone Encounter (Addendum)
Will follow.

## 2020-04-19 NOTE — Progress Notes (Addendum)
Attempted to contact [facility] at [phone] several times to report on this pt. Attempt calls not successful. Will try again at a later time.

## 2020-04-20 DIAGNOSIS — E1142 Type 2 diabetes mellitus with diabetic polyneuropathy: Secondary | ICD-10-CM | POA: Diagnosis not present

## 2020-04-20 DIAGNOSIS — M25551 Pain in right hip: Secondary | ICD-10-CM | POA: Diagnosis not present

## 2020-04-20 DIAGNOSIS — M5136 Other intervertebral disc degeneration, lumbar region: Secondary | ICD-10-CM | POA: Diagnosis not present

## 2020-04-20 DIAGNOSIS — M4820 Kissing spine, site unspecified: Secondary | ICD-10-CM | POA: Diagnosis not present

## 2020-04-20 DIAGNOSIS — M545 Low back pain, unspecified: Secondary | ICD-10-CM | POA: Diagnosis not present

## 2020-04-20 DIAGNOSIS — Z7901 Long term (current) use of anticoagulants: Secondary | ICD-10-CM | POA: Diagnosis not present

## 2020-04-20 DIAGNOSIS — S32501D Unspecified fracture of right pubis, subsequent encounter for fracture with routine healing: Secondary | ICD-10-CM | POA: Diagnosis not present

## 2020-04-20 NOTE — Telephone Encounter (Signed)
Pt is in rehab

## 2020-04-22 DIAGNOSIS — I10 Essential (primary) hypertension: Secondary | ICD-10-CM | POA: Diagnosis not present

## 2020-04-22 DIAGNOSIS — Z7901 Long term (current) use of anticoagulants: Secondary | ICD-10-CM | POA: Diagnosis not present

## 2020-04-22 DIAGNOSIS — E1169 Type 2 diabetes mellitus with other specified complication: Secondary | ICD-10-CM | POA: Diagnosis not present

## 2020-04-22 DIAGNOSIS — S32501D Unspecified fracture of right pubis, subsequent encounter for fracture with routine healing: Secondary | ICD-10-CM | POA: Diagnosis not present

## 2020-04-23 DIAGNOSIS — M6281 Muscle weakness (generalized): Secondary | ICD-10-CM | POA: Diagnosis not present

## 2020-04-23 DIAGNOSIS — S32501A Unspecified fracture of right pubis, initial encounter for closed fracture: Secondary | ICD-10-CM | POA: Diagnosis not present

## 2020-04-23 DIAGNOSIS — E1142 Type 2 diabetes mellitus with diabetic polyneuropathy: Secondary | ICD-10-CM | POA: Diagnosis not present

## 2020-04-23 DIAGNOSIS — I82403 Acute embolism and thrombosis of unspecified deep veins of lower extremity, bilateral: Secondary | ICD-10-CM | POA: Diagnosis not present

## 2020-04-23 DIAGNOSIS — M4802 Spinal stenosis, cervical region: Secondary | ICD-10-CM | POA: Diagnosis not present

## 2020-04-23 DIAGNOSIS — Z7901 Long term (current) use of anticoagulants: Secondary | ICD-10-CM | POA: Diagnosis not present

## 2020-04-23 DIAGNOSIS — W19XXXA Unspecified fall, initial encounter: Secondary | ICD-10-CM | POA: Diagnosis not present

## 2020-04-26 ENCOUNTER — Encounter: Payer: Self-pay | Admitting: Internal Medicine

## 2020-04-26 DIAGNOSIS — I82403 Acute embolism and thrombosis of unspecified deep veins of lower extremity, bilateral: Secondary | ICD-10-CM | POA: Diagnosis not present

## 2020-04-26 DIAGNOSIS — S32501A Unspecified fracture of right pubis, initial encounter for closed fracture: Secondary | ICD-10-CM | POA: Diagnosis not present

## 2020-04-26 DIAGNOSIS — W19XXXA Unspecified fall, initial encounter: Secondary | ICD-10-CM | POA: Diagnosis not present

## 2020-04-26 DIAGNOSIS — M545 Low back pain, unspecified: Secondary | ICD-10-CM | POA: Diagnosis not present

## 2020-04-26 DIAGNOSIS — I82409 Acute embolism and thrombosis of unspecified deep veins of unspecified lower extremity: Secondary | ICD-10-CM | POA: Insufficient documentation

## 2020-04-26 NOTE — Telephone Encounter (Signed)
Called and discussed with [place].  Anita Walker is in skilled nursing now.  Discussed increased falls.  Discussed concern regarding risk of bleeding with these falls.  Has a history of DVT and PE (bilateral).  On coumadin for this history.  Discussed risk of possible clot with stopping coumadin, but again expressed my concern regarding increased bleeding with these multiple falls.  [name] understands and is in agreement.

## 2020-04-28 ENCOUNTER — Ambulatory Visit: Payer: Medicare Other | Admitting: Internal Medicine

## 2020-04-30 DIAGNOSIS — W19XXXA Unspecified fall, initial encounter: Secondary | ICD-10-CM | POA: Diagnosis not present

## 2020-04-30 DIAGNOSIS — R0902 Hypoxemia: Secondary | ICD-10-CM | POA: Diagnosis not present

## 2020-04-30 DIAGNOSIS — Z7901 Long term (current) use of anticoagulants: Secondary | ICD-10-CM | POA: Diagnosis not present

## 2020-04-30 DIAGNOSIS — I82403 Acute embolism and thrombosis of unspecified deep veins of lower extremity, bilateral: Secondary | ICD-10-CM | POA: Diagnosis not present

## 2020-04-30 DIAGNOSIS — M545 Low back pain, unspecified: Secondary | ICD-10-CM | POA: Diagnosis not present

## 2020-04-30 DIAGNOSIS — S32501A Unspecified fracture of right pubis, initial encounter for closed fracture: Secondary | ICD-10-CM | POA: Diagnosis not present

## 2020-05-05 ENCOUNTER — Encounter: Payer: Medicare Other | Admitting: Internal Medicine

## 2020-05-05 DIAGNOSIS — Z7901 Long term (current) use of anticoagulants: Secondary | ICD-10-CM | POA: Diagnosis not present

## 2020-05-05 DIAGNOSIS — M6281 Muscle weakness (generalized): Secondary | ICD-10-CM | POA: Diagnosis not present

## 2020-05-05 DIAGNOSIS — I82403 Acute embolism and thrombosis of unspecified deep veins of lower extremity, bilateral: Secondary | ICD-10-CM | POA: Diagnosis not present

## 2020-05-05 DIAGNOSIS — S32501D Unspecified fracture of right pubis, subsequent encounter for fracture with routine healing: Secondary | ICD-10-CM | POA: Diagnosis not present

## 2020-05-05 DIAGNOSIS — R1030 Lower abdominal pain, unspecified: Secondary | ICD-10-CM | POA: Diagnosis not present

## 2020-05-05 DIAGNOSIS — R202 Paresthesia of skin: Secondary | ICD-10-CM | POA: Diagnosis not present

## 2020-05-05 DIAGNOSIS — I10 Essential (primary) hypertension: Secondary | ICD-10-CM | POA: Diagnosis not present

## 2020-05-05 DIAGNOSIS — E1142 Type 2 diabetes mellitus with diabetic polyneuropathy: Secondary | ICD-10-CM | POA: Diagnosis not present

## 2020-05-11 DIAGNOSIS — E1142 Type 2 diabetes mellitus with diabetic polyneuropathy: Secondary | ICD-10-CM | POA: Diagnosis not present

## 2020-05-11 DIAGNOSIS — R059 Cough, unspecified: Secondary | ICD-10-CM | POA: Diagnosis not present

## 2020-05-11 DIAGNOSIS — Z7901 Long term (current) use of anticoagulants: Secondary | ICD-10-CM | POA: Diagnosis not present

## 2020-05-11 DIAGNOSIS — R0981 Nasal congestion: Secondary | ICD-10-CM | POA: Diagnosis not present

## 2020-05-17 DIAGNOSIS — R059 Cough, unspecified: Secondary | ICD-10-CM | POA: Diagnosis not present

## 2020-05-17 DIAGNOSIS — J019 Acute sinusitis, unspecified: Secondary | ICD-10-CM | POA: Diagnosis not present

## 2020-05-17 DIAGNOSIS — R0981 Nasal congestion: Secondary | ICD-10-CM | POA: Diagnosis not present

## 2020-06-07 DIAGNOSIS — E785 Hyperlipidemia, unspecified: Secondary | ICD-10-CM | POA: Diagnosis not present

## 2020-06-07 DIAGNOSIS — I10 Essential (primary) hypertension: Secondary | ICD-10-CM | POA: Diagnosis not present

## 2020-06-07 DIAGNOSIS — S32501D Unspecified fracture of right pubis, subsequent encounter for fracture with routine healing: Secondary | ICD-10-CM | POA: Diagnosis not present

## 2020-06-07 DIAGNOSIS — E1142 Type 2 diabetes mellitus with diabetic polyneuropathy: Secondary | ICD-10-CM | POA: Diagnosis not present

## 2020-06-07 DIAGNOSIS — F4320 Adjustment disorder, unspecified: Secondary | ICD-10-CM | POA: Diagnosis not present

## 2020-06-07 DIAGNOSIS — Z72821 Inadequate sleep hygiene: Secondary | ICD-10-CM | POA: Diagnosis not present

## 2020-06-07 DIAGNOSIS — R059 Cough, unspecified: Secondary | ICD-10-CM | POA: Diagnosis not present

## 2020-06-07 DIAGNOSIS — W19XXXA Unspecified fall, initial encounter: Secondary | ICD-10-CM | POA: Diagnosis not present

## 2020-06-17 DIAGNOSIS — R0981 Nasal congestion: Secondary | ICD-10-CM | POA: Diagnosis not present

## 2020-06-17 DIAGNOSIS — J302 Other seasonal allergic rhinitis: Secondary | ICD-10-CM | POA: Diagnosis not present

## 2020-06-17 DIAGNOSIS — R059 Cough, unspecified: Secondary | ICD-10-CM | POA: Diagnosis not present

## 2020-06-20 ENCOUNTER — Encounter: Payer: Self-pay | Admitting: Internal Medicine

## 2020-06-23 NOTE — Telephone Encounter (Signed)
I have touched base with her previously hematologist and I would like to discuss with her about stopping the coumadin.  Please schedule an appt to discuss.  Can schedule Thursday - see if ok to do phone or virtual visit

## 2020-06-23 NOTE — Telephone Encounter (Signed)
Spoke with patients daughter. She does not come home from rehab until next week. They want to do a virtual next week. Is there a particular day that you would like to add her to?

## 2020-06-23 NOTE — Telephone Encounter (Signed)
Can add 12:00 Monday, Thursday or Friday.

## 2020-06-24 ENCOUNTER — Ambulatory Visit: Payer: Medicare Other | Admitting: Podiatry

## 2020-06-25 DIAGNOSIS — Z7901 Long term (current) use of anticoagulants: Secondary | ICD-10-CM | POA: Diagnosis not present

## 2020-06-25 DIAGNOSIS — M4820 Kissing spine, site unspecified: Secondary | ICD-10-CM | POA: Diagnosis not present

## 2020-06-25 DIAGNOSIS — M6281 Muscle weakness (generalized): Secondary | ICD-10-CM | POA: Diagnosis not present

## 2020-06-25 DIAGNOSIS — E1142 Type 2 diabetes mellitus with diabetic polyneuropathy: Secondary | ICD-10-CM | POA: Diagnosis not present

## 2020-06-25 DIAGNOSIS — I10 Essential (primary) hypertension: Secondary | ICD-10-CM | POA: Diagnosis not present

## 2020-06-25 DIAGNOSIS — M25551 Pain in right hip: Secondary | ICD-10-CM | POA: Diagnosis not present

## 2020-06-25 DIAGNOSIS — S32501D Unspecified fracture of right pubis, subsequent encounter for fracture with routine healing: Secondary | ICD-10-CM | POA: Diagnosis not present

## 2020-06-25 DIAGNOSIS — M5136 Other intervertebral disc degeneration, lumbar region: Secondary | ICD-10-CM | POA: Diagnosis not present

## 2020-06-29 DIAGNOSIS — E538 Deficiency of other specified B group vitamins: Secondary | ICD-10-CM | POA: Diagnosis not present

## 2020-06-29 DIAGNOSIS — Z86718 Personal history of other venous thrombosis and embolism: Secondary | ICD-10-CM | POA: Diagnosis not present

## 2020-06-29 DIAGNOSIS — M47816 Spondylosis without myelopathy or radiculopathy, lumbar region: Secondary | ICD-10-CM | POA: Diagnosis not present

## 2020-06-29 DIAGNOSIS — Z853 Personal history of malignant neoplasm of breast: Secondary | ICD-10-CM | POA: Diagnosis not present

## 2020-06-29 DIAGNOSIS — M5137 Other intervertebral disc degeneration, lumbosacral region: Secondary | ICD-10-CM | POA: Diagnosis not present

## 2020-06-29 DIAGNOSIS — S32501D Unspecified fracture of right pubis, subsequent encounter for fracture with routine healing: Secondary | ICD-10-CM | POA: Diagnosis not present

## 2020-06-29 DIAGNOSIS — M48061 Spinal stenosis, lumbar region without neurogenic claudication: Secondary | ICD-10-CM | POA: Diagnosis not present

## 2020-06-29 DIAGNOSIS — K5900 Constipation, unspecified: Secondary | ICD-10-CM | POA: Diagnosis not present

## 2020-06-29 DIAGNOSIS — Z86711 Personal history of pulmonary embolism: Secondary | ICD-10-CM | POA: Diagnosis not present

## 2020-06-29 DIAGNOSIS — I4891 Unspecified atrial fibrillation: Secondary | ICD-10-CM | POA: Diagnosis not present

## 2020-06-29 DIAGNOSIS — D6859 Other primary thrombophilia: Secondary | ICD-10-CM | POA: Diagnosis not present

## 2020-06-29 DIAGNOSIS — H9190 Unspecified hearing loss, unspecified ear: Secondary | ICD-10-CM | POA: Diagnosis not present

## 2020-06-29 DIAGNOSIS — R32 Unspecified urinary incontinence: Secondary | ICD-10-CM | POA: Diagnosis not present

## 2020-06-29 DIAGNOSIS — G894 Chronic pain syndrome: Secondary | ICD-10-CM | POA: Diagnosis not present

## 2020-06-29 DIAGNOSIS — I10 Essential (primary) hypertension: Secondary | ICD-10-CM | POA: Diagnosis not present

## 2020-06-29 DIAGNOSIS — K219 Gastro-esophageal reflux disease without esophagitis: Secondary | ICD-10-CM | POA: Diagnosis not present

## 2020-06-29 DIAGNOSIS — E7800 Pure hypercholesterolemia, unspecified: Secondary | ICD-10-CM | POA: Diagnosis not present

## 2020-06-29 DIAGNOSIS — Z9181 History of falling: Secondary | ICD-10-CM | POA: Diagnosis not present

## 2020-06-29 DIAGNOSIS — Z8711 Personal history of peptic ulcer disease: Secondary | ICD-10-CM | POA: Diagnosis not present

## 2020-06-29 DIAGNOSIS — J309 Allergic rhinitis, unspecified: Secondary | ICD-10-CM | POA: Diagnosis not present

## 2020-06-29 DIAGNOSIS — E1142 Type 2 diabetes mellitus with diabetic polyneuropathy: Secondary | ICD-10-CM | POA: Diagnosis not present

## 2020-06-29 DIAGNOSIS — M1990 Unspecified osteoarthritis, unspecified site: Secondary | ICD-10-CM | POA: Diagnosis not present

## 2020-06-29 DIAGNOSIS — D508 Other iron deficiency anemias: Secondary | ICD-10-CM | POA: Diagnosis not present

## 2020-06-29 DIAGNOSIS — Z7901 Long term (current) use of anticoagulants: Secondary | ICD-10-CM | POA: Diagnosis not present

## 2020-06-29 NOTE — Telephone Encounter (Signed)
Pt scheduled with Dr Nicki Reaper next week at 4 pm per daughters request

## 2020-06-30 DIAGNOSIS — M5137 Other intervertebral disc degeneration, lumbosacral region: Secondary | ICD-10-CM | POA: Diagnosis not present

## 2020-06-30 DIAGNOSIS — S32501D Unspecified fracture of right pubis, subsequent encounter for fracture with routine healing: Secondary | ICD-10-CM | POA: Diagnosis not present

## 2020-06-30 DIAGNOSIS — M1990 Unspecified osteoarthritis, unspecified site: Secondary | ICD-10-CM | POA: Diagnosis not present

## 2020-06-30 DIAGNOSIS — M47816 Spondylosis without myelopathy or radiculopathy, lumbar region: Secondary | ICD-10-CM | POA: Diagnosis not present

## 2020-06-30 DIAGNOSIS — G894 Chronic pain syndrome: Secondary | ICD-10-CM | POA: Diagnosis not present

## 2020-06-30 DIAGNOSIS — M48061 Spinal stenosis, lumbar region without neurogenic claudication: Secondary | ICD-10-CM | POA: Diagnosis not present

## 2020-07-02 ENCOUNTER — Telehealth: Payer: Self-pay | Admitting: Internal Medicine

## 2020-07-02 ENCOUNTER — Telehealth (INDEPENDENT_AMBULATORY_CARE_PROVIDER_SITE_OTHER): Payer: Medicare Other | Admitting: Internal Medicine

## 2020-07-02 DIAGNOSIS — G629 Polyneuropathy, unspecified: Secondary | ICD-10-CM | POA: Diagnosis not present

## 2020-07-02 DIAGNOSIS — W19XXXD Unspecified fall, subsequent encounter: Secondary | ICD-10-CM

## 2020-07-02 DIAGNOSIS — Z9181 History of falling: Secondary | ICD-10-CM

## 2020-07-02 DIAGNOSIS — D649 Anemia, unspecified: Secondary | ICD-10-CM

## 2020-07-02 DIAGNOSIS — I10 Essential (primary) hypertension: Secondary | ICD-10-CM | POA: Diagnosis not present

## 2020-07-02 DIAGNOSIS — E1142 Type 2 diabetes mellitus with diabetic polyneuropathy: Secondary | ICD-10-CM

## 2020-07-02 DIAGNOSIS — G319 Degenerative disease of nervous system, unspecified: Secondary | ICD-10-CM

## 2020-07-02 DIAGNOSIS — Z8781 Personal history of (healed) traumatic fracture: Secondary | ICD-10-CM

## 2020-07-02 DIAGNOSIS — E1149 Type 2 diabetes mellitus with other diabetic neurological complication: Secondary | ICD-10-CM

## 2020-07-02 NOTE — Telephone Encounter (Signed)
-----   Message from Lloyd Huger, MD sent at 06/22/2020 11:01 PM EDT ----- Regarding: RE: question Looks like over 10 years at least. Sounds like the risk of a bleed far outweighs the risk of another clot.  I agree with stopping the coumadin and monitor.  Thanks!  -Tim  ----- Message ----- From: Einar Pheasant, MD Sent: 06/22/2020   4:16 PM EDT To: Lloyd Huger, MD Subject: question                                       Ms Martinique has a history of DVT and bilateral pulmonary emboli.  You have seen her previously, but it has been years.  She has been maintained on coumadin.  She is now having recurring falls.  I would like to get your input regarding stopping her coumadin.  Other than  just monitoring her for clots, etc, is there anything more you would recommend?  Thank you for your help.  I can schedule her an appt to see you if needed.  (I am a little nervous about stopping, but she has had multiple falls).    Thank you.  Ianmichael Amescua

## 2020-07-02 NOTE — Progress Notes (Signed)
Patient ID: Anita Ann R Walker, female   DOB: September 16, 1940, 80 y.o.   MRN: [id number]   Virtual Visit via virtual Note  This visit type was conducted due to national recommendations for restrictions regarding the COVID-19 pandemic (e.g. social distancing).  This format is felt to be most appropriate for this patient at this time.  All issues noted in this document were discussed and addressed.  No physical exam was performed (except for noted visual exam findings with Video Visits).   I connected with Anita Walker by a video enabled telemedicine application and verified that I am speaking with the correct person using two identifiers. Location patient: home Location provider: work  Persons participating in the virtual visit: patient, provider and patients daughter - Sara Chu.   The limitations, risks, security and privacy concerns of performing an evaluation and management service by video and the availability of in person appointments have been discussed. It has also been discussed with the patient that there may be a patient responsible charge related to this service. The patient expressed understanding and agreed to proceed.   Reason for visit: follow up - recent hospitalization and just discharged from SNF  HPI: Here for hospital f/u and SNF f/u.  Daughter accompanies her.  History obtained from both of them.  Was admitted 04/15/20 - 04/19/20 after falling again.  Suffered pelvic fracture.  S/p right superior and inferior pubic rami fracture secondary to mechanical fall.  Was admitted for pain control - -IV morphine for severe pain and tramadol for moderate pain.  Tylenol for mild pain.  PT and OT evaluated.  Was discharged to skilled nursing.  Remained in skilled nursing - for therapy - until 06/26/20.  Came home 4/23.  Home health PT arranged.  Daughter trying to stay with her.  Daughter reports that Ms Walker has problems picking up her feet/legs.  Needs assistance with bathing and dressing.  Daughter  arranged meals on wheels.  Unable to prepare meals.  Drinking ensure qod.  States has a good appetite.  Has elevated toilet seat.  Needs assistance in bathroom and with ambulation.  History of frequent falls.  No chest pain. Breathing stable.  No nausea, vomiting or abdominal pain.  Previous diarrhea - two episodes.  No diarrhea now.  Discussed frequent falls.  Discussed coumadin therapy.  Given her multiple falls recently, discussed stopping coumadin.  Has been on coumadin for years. No clots for >10 years.  Daughter expressed concern regarding needing help at home - given above.    ROS: See pertinent positives and negatives per HPI.  Past Medical History:  Diagnosis Date  . Anemia   . Arthritis   . Atrial fibrillation ([place])   . Breast cancer ([place])    s/p lumpectomy 1992.  s/p chemo and xrt left breast  . Diabetes mellitus ([place])   . Edema    feet/legs  . Gastric ulcer   . GERD (gastroesophageal reflux disease)   . HOH (hard of hearing)    aides  . Hypercholesterolemia   . Hypertension   . Personal history of chemotherapy   . Pulmonary emboli [facility])     Past Surgical History:  Procedure Laterality Date  . BREAST LUMPECTOMY  03/06/1990   left breast  . CATARACT EXTRACTION W/PHACO Right 12/26/2016   Procedure: CATARACT EXTRACTION PHACO AND INTRAOCULAR LENS PLACEMENT (IOC)-RIGHT DIABETIC;  Surgeon: Birder Robson, MD;  Location: [facility] ORS;  Service: Ophthalmology;  Laterality: Right;  [place] 00:47 AP% 24.5 CDE 11.62 Fluid pack  lot # [id number]  . CATARACT EXTRACTION W/PHACO Left 01/23/2017   Procedure: CATARACT EXTRACTION PHACO AND INTRAOCULAR LENS PLACEMENT (IOC);  Surgeon: Birder Robson, MD;  Location: [facility] ORS;  Service: Ophthalmology;  Laterality: Left;  [place] 00:50 AP% 16.1 CDE 8.18 Fluid pack lot #[id number]  . COLONOSCOPY WITH PROPOFOL N/A 07/08/2019   Procedure: COLONOSCOPY WITH PROPOFOL;  Surgeon: Lucilla Lame, MD;  Location: [facility] ENDOSCOPY;  Service: Endoscopy;  Laterality: N/A;     Family History  Problem Relation Age of Onset  . Heart disease Father   . Heart disease Brother        s/p CABG  . Breast cancer Daughter   . Colon cancer Neg Hx     SOCIAL HX: reviewed.    Current Outpatient Medications:  .  acetaminophen (TYLENOL) 325 MG tablet, Take 2 tablets (650 mg total) by mouth every 6 (six) hours as needed for moderate pain (headache)., Disp: 30 tablet, Rfl: 0 .  amLODipine (NORVASC) 2.5 MG tablet, TAKE 1 TABLET(2.5 MG) BY MOUTH DAILY, Disp: 90 tablet, Rfl: 1 .  diphenhydrAMINE (BENADRYL) 25 mg capsule, Take 25 mg at bedtime as needed by mouth for sleep. , Disp: , Rfl:  .  feeding supplement (ENSURE ENLIVE / ENSURE PLUS) LIQD, Take 237 mLs by mouth 2 (two) times daily between meals., Disp: 237 mL, Rfl: 12 .  FEROSUL 325 (65 Fe) MG tablet, TAKE 1 TABLET(325 MG) BY MOUTH DAILY, Disp: 90 tablet, Rfl: 2 .  glucose blood (CONTOUR TEST) test strip, USE TO TEST BLOOD SUGAR ONCE EVERY WEEK, Disp: 25 strip, Rfl: 1 .  losartan (COZAAR) 100 MG tablet, TAKE 1 TABLET(100 MG) BY MOUTH DAILY, Disp: 90 tablet, Rfl: 3 .  Multiple Vitamin (MULTIVITAMIN WITH MINERALS) TABS tablet, Take 1 tablet daily by mouth., Disp: , Rfl:  .  pantoprazole (PROTONIX) 40 MG tablet, TAKE 1 TABLET(40 MG) BY MOUTH DAILY, Disp: 90 tablet, Rfl: 1 .  simvastatin (ZOCOR) 10 MG tablet, TAKE 1 TABLET BY MOUTH EVERY DAY, Disp: 90 tablet, Rfl: 1 .  traMADol (ULTRAM) 50 MG tablet, Take 1 tablet (50 mg total) by mouth every 8 (eight) hours as needed., Disp: 20 tablet, Rfl: 0 .  warfarin (COUMADIN) 2.5 MG tablet, Take 2.5-5 mg by mouth daily at 4 PM. Takes 2.5 mg and 5 mg alternating days., Disp: , Rfl:   EXAM:  GENERAL: alert, oriented, appears well and in no acute distress  HEENT: atraumatic, conjunttiva clear, no obvious abnormalities on inspection of external nose and ears  NECK: normal movements of the head and neck  LUNGS: on inspection no signs of respiratory distress, breathing rate appears  normal, no obvious gross SOB, gasping or wheezing  CV: no obvious cyanosis  PSYCH/NEURO: pleasant and cooperative, no obvious depression or anxiety, speech and thought processing grossly intact  ASSESSMENT AND PLAN:  Discussed the following assessment and plan:  Problem List Items Addressed This Visit    Anemia    Decreased hgb noted on recent labs.  Recheck cbc and check iron studies and B12.        Relevant Orders   CBC with Differential/Platelet   Ferritin   At high risk for falls (Chronic)    Recurring falls as outlined.  See if can arrange further help (with ADLs) through home health - as outlined.  Stop coumadin as outlined.       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  Mild.  Stable.  Diabetic polyneuropathy associated with type 2 diabetes mellitus ([place])    Sugars have been well controlled.  Continue low carb diet and exercise.  Continue to use walker for ambulation.  Follow met b and a1c.       Relevant Orders   Basic metabolic panel   Fall    Has had recurring falls.  Discussed coumadin risk.  Has been on coumadin for years.  History of DVT and bilateral pulmonary emboli.  Discussed with hematology.  Given no clots for >10 years, will stop coumadin.  Ms Walker and her daughter are in agreement.  Will have to follow.  They understand risks of clots and being off coumadin.  Follow.       History of pelvic fracture    Recently admitted after fall and found to have pelvic fracture.  Admitted for pain control.  Discharged to SNF for continued rehab.  Just recently home - 06/26/20.  [facility] PT - seeing her.  Discussed with Ms Walker and her daughter today.  Needs more assistance in the home - with ADLs as outlined.  Has trouble picking up her feet and legs. Discussed walking with walker.  Needs assistance with dressing and bathing.  Unable to prepare meals.  Will discuss with home health regarding further assistance.        Hypertension    Continue  losartan and amlodipine.  Follow pressures.  Follow metabolic panel.       Type 2 diabetes mellitus with neurological complications (HCC)    Low carb diet and exercise as tolerated.  Follow met b and a1c.       Relevant Orders   Hemoglobin A1c   Hepatic function panel   Lipid panel    Other Visit Diagnoses    Neuropathy    -  Primary       I discussed the assessment and treatment plan with the patient. The patient was provided an opportunity to ask questions and all were answered. The patient agreed with the plan and demonstrated an understanding of the instructions.   The patient was advised to call back or seek an in-person evaluation if the symptoms worsen or if the condition fails to improve as anticipated.    Einar Pheasant, MD

## 2020-07-04 ENCOUNTER — Encounter: Payer: Self-pay | Admitting: Internal Medicine

## 2020-07-04 ENCOUNTER — Telehealth: Payer: Self-pay | Admitting: Internal Medicine

## 2020-07-04 DIAGNOSIS — D649 Anemia, unspecified: Secondary | ICD-10-CM | POA: Insufficient documentation

## 2020-07-04 DIAGNOSIS — Z8781 Personal history of (healed) traumatic fracture: Secondary | ICD-10-CM | POA: Insufficient documentation

## 2020-07-04 NOTE — Assessment & Plan Note (Signed)
Continue losartan and amlodipine.  Follow pressures.  Follow metabolic panel.  

## 2020-07-04 NOTE — Assessment & Plan Note (Signed)
Decreased hgb noted on recent labs.  Recheck cbc and check iron studies and B12.

## 2020-07-04 NOTE — Assessment & Plan Note (Signed)
Low carb diet and exercise as tolerated.  Follow met b and a1c.  

## 2020-07-04 NOTE — Assessment & Plan Note (Signed)
Found on recent CT head.  Mild.  Stable.

## 2020-07-04 NOTE — Assessment & Plan Note (Signed)
Recently admitted after fall and found to have pelvic fracture.  Admitted for pain control.  Discharged to SNF for continued rehab.  Just recently home - 06/26/20.  [facility] PT - seeing her.  Discussed with Ms [place] and her daughter today.  Needs more assistance in the home - with ADLs as outlined.  Has trouble picking up her feet and legs. Discussed walking with walker.  Needs assistance with dressing and bathing.  Unable to prepare meals.  Will discuss with home health regarding further assistance.

## 2020-07-04 NOTE — Assessment & Plan Note (Signed)
Recurring falls as outlined.  See if can arrange further help (with ADLs) through home health - as outlined.  Stop coumadin as outlined.

## 2020-07-04 NOTE — Assessment & Plan Note (Signed)
Sugars have been well controlled.  Continue low carb diet and exercise.  Continue to use walker for ambulation.  Follow met b and a1c.  

## 2020-07-04 NOTE — Telephone Encounter (Signed)
Currently receiving home health PT through [facility].  See my office note for specifics.  Needs help with ADLs.  Can they add an aid to come out and help.  Also if able, the labs scheduled to be done here, can they do and save her from coming in.

## 2020-07-04 NOTE — Assessment & Plan Note (Signed)
Has had recurring falls.  Discussed coumadin risk.  Has been on coumadin for years.  History of DVT and bilateral pulmonary emboli.  Discussed with hematology.  Given no clots for >10 years, will stop coumadin.  Anita Walker and her daughter are in agreement.  Will have to follow.  They understand risks of clots and being off coumadin.  Follow.

## 2020-07-05 DIAGNOSIS — M48061 Spinal stenosis, lumbar region without neurogenic claudication: Secondary | ICD-10-CM | POA: Diagnosis not present

## 2020-07-05 DIAGNOSIS — G894 Chronic pain syndrome: Secondary | ICD-10-CM | POA: Diagnosis not present

## 2020-07-05 DIAGNOSIS — M47816 Spondylosis without myelopathy or radiculopathy, lumbar region: Secondary | ICD-10-CM | POA: Diagnosis not present

## 2020-07-05 DIAGNOSIS — M1990 Unspecified osteoarthritis, unspecified site: Secondary | ICD-10-CM | POA: Diagnosis not present

## 2020-07-05 DIAGNOSIS — M5137 Other intervertebral disc degeneration, lumbosacral region: Secondary | ICD-10-CM | POA: Diagnosis not present

## 2020-07-05 DIAGNOSIS — S32501D Unspecified fracture of right pubis, subsequent encounter for fracture with routine healing: Secondary | ICD-10-CM | POA: Diagnosis not present

## 2020-07-05 NOTE — Telephone Encounter (Signed)
Left message on the VM for a [facility] nurse to return my call.    [facility] HH- [phone]

## 2020-07-05 NOTE — Telephone Encounter (Signed)
Noted.  Let me know if I need to do anything.  ?

## 2020-07-05 NOTE — Telephone Encounter (Signed)
Spoke with [facility] receptionist. She is going to put a note in to have Nursing and OT go out to do an evaluation and see what pt will qualify for. Nurse is supposed to call me back once she gets the message. I will give verbal for labs at that time. Receptionist stated that they usually are able to go out within 1-2 days of verbal order.

## 2020-07-06 ENCOUNTER — Ambulatory Visit: Payer: Medicare Other | Admitting: Internal Medicine

## 2020-07-07 NOTE — Telephone Encounter (Signed)
The labs and the assistance described in my office note - all that I am aware of at this time.

## 2020-07-07 NOTE — Telephone Encounter (Signed)
NotedJenetta Walker given

## 2020-07-07 NOTE — Telephone Encounter (Signed)
Spoke with nurse at [facility]. Gave verbal for nursing evaluation, OT eval, and to have aide come out and help- nurse stated they could send someone twice a week. Also gave orders to have labs drawn (ones ordered in chart). Was there anything else that we needed? Just wanted to confirm before they go out to see her next week.

## 2020-07-08 DIAGNOSIS — S32501D Unspecified fracture of right pubis, subsequent encounter for fracture with routine healing: Secondary | ICD-10-CM | POA: Diagnosis not present

## 2020-07-08 DIAGNOSIS — M48061 Spinal stenosis, lumbar region without neurogenic claudication: Secondary | ICD-10-CM | POA: Diagnosis not present

## 2020-07-08 DIAGNOSIS — G894 Chronic pain syndrome: Secondary | ICD-10-CM | POA: Diagnosis not present

## 2020-07-08 DIAGNOSIS — M47816 Spondylosis without myelopathy or radiculopathy, lumbar region: Secondary | ICD-10-CM | POA: Diagnosis not present

## 2020-07-08 DIAGNOSIS — M1990 Unspecified osteoarthritis, unspecified site: Secondary | ICD-10-CM | POA: Diagnosis not present

## 2020-07-08 DIAGNOSIS — M5137 Other intervertebral disc degeneration, lumbosacral region: Secondary | ICD-10-CM | POA: Diagnosis not present

## 2020-07-12 DIAGNOSIS — M1990 Unspecified osteoarthritis, unspecified site: Secondary | ICD-10-CM | POA: Diagnosis not present

## 2020-07-12 DIAGNOSIS — G894 Chronic pain syndrome: Secondary | ICD-10-CM | POA: Diagnosis not present

## 2020-07-12 DIAGNOSIS — S32501D Unspecified fracture of right pubis, subsequent encounter for fracture with routine healing: Secondary | ICD-10-CM | POA: Diagnosis not present

## 2020-07-12 DIAGNOSIS — M5137 Other intervertebral disc degeneration, lumbosacral region: Secondary | ICD-10-CM | POA: Diagnosis not present

## 2020-07-12 DIAGNOSIS — M47816 Spondylosis without myelopathy or radiculopathy, lumbar region: Secondary | ICD-10-CM | POA: Diagnosis not present

## 2020-07-12 DIAGNOSIS — M48061 Spinal stenosis, lumbar region without neurogenic claudication: Secondary | ICD-10-CM | POA: Diagnosis not present

## 2020-07-13 ENCOUNTER — Telehealth: Payer: Self-pay | Admitting: Internal Medicine

## 2020-07-13 DIAGNOSIS — M1990 Unspecified osteoarthritis, unspecified site: Secondary | ICD-10-CM | POA: Diagnosis not present

## 2020-07-13 DIAGNOSIS — M5137 Other intervertebral disc degeneration, lumbosacral region: Secondary | ICD-10-CM | POA: Diagnosis not present

## 2020-07-13 DIAGNOSIS — M48061 Spinal stenosis, lumbar region without neurogenic claudication: Secondary | ICD-10-CM | POA: Diagnosis not present

## 2020-07-13 DIAGNOSIS — S32501D Unspecified fracture of right pubis, subsequent encounter for fracture with routine healing: Secondary | ICD-10-CM | POA: Diagnosis not present

## 2020-07-13 DIAGNOSIS — M47816 Spondylosis without myelopathy or radiculopathy, lumbar region: Secondary | ICD-10-CM | POA: Diagnosis not present

## 2020-07-13 DIAGNOSIS — G894 Chronic pain syndrome: Secondary | ICD-10-CM | POA: Diagnosis not present

## 2020-07-13 NOTE — Telephone Encounter (Signed)
nurse from [facility] called in stated  That patient could not draw blood because she only l=has one good arm to draw but could not draw tod she needs to come in to lab nurse Linward Foster [phone]

## 2020-07-14 NOTE — Telephone Encounter (Signed)
Nurse stated that patient would only let her stick her one time and would not allow them to use her hand. Was unable to draw labs. She is going to have to come here or [facility].

## 2020-07-15 DIAGNOSIS — S32501D Unspecified fracture of right pubis, subsequent encounter for fracture with routine healing: Secondary | ICD-10-CM | POA: Diagnosis not present

## 2020-07-15 DIAGNOSIS — M5137 Other intervertebral disc degeneration, lumbosacral region: Secondary | ICD-10-CM | POA: Diagnosis not present

## 2020-07-15 DIAGNOSIS — M47816 Spondylosis without myelopathy or radiculopathy, lumbar region: Secondary | ICD-10-CM | POA: Diagnosis not present

## 2020-07-15 DIAGNOSIS — G894 Chronic pain syndrome: Secondary | ICD-10-CM | POA: Diagnosis not present

## 2020-07-15 DIAGNOSIS — M1990 Unspecified osteoarthritis, unspecified site: Secondary | ICD-10-CM | POA: Diagnosis not present

## 2020-07-15 DIAGNOSIS — M48061 Spinal stenosis, lumbar region without neurogenic claudication: Secondary | ICD-10-CM | POA: Diagnosis not present

## 2020-07-15 NOTE — Telephone Encounter (Signed)
[name] called back and made an appt for 07/21/20 for fasting labs

## 2020-07-15 NOTE — Telephone Encounter (Signed)
Left message for Anita Walker to call back so I can get pt scheduled for labs.

## 2020-07-15 NOTE — Telephone Encounter (Signed)
noted 

## 2020-07-20 DIAGNOSIS — S32501D Unspecified fracture of right pubis, subsequent encounter for fracture with routine healing: Secondary | ICD-10-CM | POA: Diagnosis not present

## 2020-07-20 DIAGNOSIS — M5137 Other intervertebral disc degeneration, lumbosacral region: Secondary | ICD-10-CM | POA: Diagnosis not present

## 2020-07-20 DIAGNOSIS — M47816 Spondylosis without myelopathy or radiculopathy, lumbar region: Secondary | ICD-10-CM | POA: Diagnosis not present

## 2020-07-20 DIAGNOSIS — M48061 Spinal stenosis, lumbar region without neurogenic claudication: Secondary | ICD-10-CM | POA: Diagnosis not present

## 2020-07-20 DIAGNOSIS — G894 Chronic pain syndrome: Secondary | ICD-10-CM | POA: Diagnosis not present

## 2020-07-20 DIAGNOSIS — M1990 Unspecified osteoarthritis, unspecified site: Secondary | ICD-10-CM | POA: Diagnosis not present

## 2020-07-21 ENCOUNTER — Other Ambulatory Visit: Payer: Medicare Other

## 2020-07-22 ENCOUNTER — Other Ambulatory Visit (INDEPENDENT_AMBULATORY_CARE_PROVIDER_SITE_OTHER): Payer: Medicare Other

## 2020-07-22 ENCOUNTER — Other Ambulatory Visit: Payer: Self-pay

## 2020-07-22 DIAGNOSIS — D649 Anemia, unspecified: Secondary | ICD-10-CM | POA: Diagnosis not present

## 2020-07-22 DIAGNOSIS — E1142 Type 2 diabetes mellitus with diabetic polyneuropathy: Secondary | ICD-10-CM

## 2020-07-22 DIAGNOSIS — E1149 Type 2 diabetes mellitus with other diabetic neurological complication: Secondary | ICD-10-CM | POA: Diagnosis not present

## 2020-07-22 DIAGNOSIS — Z7901 Long term (current) use of anticoagulants: Secondary | ICD-10-CM

## 2020-07-22 LAB — HEPATIC FUNCTION PANEL
ALT: 10 U/L (ref 0–35)
AST: 14 U/L (ref 0–37)
Albumin: 3.9 g/dL (ref 3.5–5.2)
Alkaline Phosphatase: 72 U/L (ref 39–117)
Bilirubin, Direct: 0.1 mg/dL (ref 0.0–0.3)
Total Bilirubin: 0.6 mg/dL (ref 0.2–1.2)
Total Protein: 6.2 g/dL (ref 6.0–8.3)

## 2020-07-22 LAB — CBC WITH DIFFERENTIAL/PLATELET
Basophils Absolute: 0.1 10*3/uL (ref 0.0–0.1)
Basophils Relative: 0.8 % (ref 0.0–3.0)
Eosinophils Absolute: 0.1 10*3/uL (ref 0.0–0.7)
Eosinophils Relative: 1.7 % (ref 0.0–5.0)
HCT: 37.4 % (ref 36.0–46.0)
Hemoglobin: 12.5 g/dL (ref 12.0–15.0)
Lymphocytes Relative: 28.9 % (ref 12.0–46.0)
Lymphs Abs: 1.8 10*3/uL (ref 0.7–4.0)
MCHC: 33.5 g/dL (ref 30.0–36.0)
MCV: 89.6 fl (ref 78.0–100.0)
Monocytes Absolute: 1.1 10*3/uL — ABNORMAL HIGH (ref 0.1–1.0)
Monocytes Relative: 18 % — ABNORMAL HIGH (ref 3.0–12.0)
Neutro Abs: 3.2 10*3/uL (ref 1.4–7.7)
Neutrophils Relative %: 50.6 % (ref 43.0–77.0)
Platelets: 220 10*3/uL (ref 150.0–400.0)
RBC: 4.18 Mil/uL (ref 3.87–5.11)
RDW: 15.5 % (ref 11.5–15.5)
WBC: 6.3 10*3/uL (ref 4.0–10.5)

## 2020-07-22 LAB — LIPID PANEL
Cholesterol: 135 mg/dL (ref 0–200)
HDL: 62.9 mg/dL (ref >39.00)
LDL Cholesterol: 56 mg/dL (ref 0–99)
NonHDL: 72.59
Total CHOL/HDL Ratio: 2
Triglycerides: 81 mg/dL (ref 0.0–149.0)
VLDL: 16.2 mg/dL (ref 0.0–40.0)

## 2020-07-22 LAB — BASIC METABOLIC PANEL
BUN: 14 mg/dL (ref 6–23)
CO2: 27 mEq/L (ref 19–32)
Calcium: 9.4 mg/dL (ref 8.4–10.5)
Chloride: 102 mEq/L (ref 96–112)
Creatinine, Ser: 0.71 mg/dL (ref 0.40–1.20)
GFR: 79.23 mL/min (ref >60.00)
Glucose, Bld: 116 mg/dL — ABNORMAL HIGH (ref 70–99)
Potassium: 4.4 mEq/L (ref 3.5–5.1)
Sodium: 137 mEq/L (ref 135–145)

## 2020-07-22 LAB — PROTIME-INR
INR: 1.2 ratio — ABNORMAL HIGH (ref 0.8–1.0)
Prothrombin Time: 12.9 s (ref 9.6–13.1)

## 2020-07-22 LAB — HEMOGLOBIN A1C: Hgb A1c MFr Bld: 5.6 % (ref 4.6–6.5)

## 2020-07-22 LAB — FERRITIN: Ferritin: 99.6 ng/mL (ref 10.0–291.0)

## 2020-07-29 DIAGNOSIS — E538 Deficiency of other specified B group vitamins: Secondary | ICD-10-CM | POA: Diagnosis not present

## 2020-07-29 DIAGNOSIS — M48061 Spinal stenosis, lumbar region without neurogenic claudication: Secondary | ICD-10-CM | POA: Diagnosis not present

## 2020-07-29 DIAGNOSIS — M1990 Unspecified osteoarthritis, unspecified site: Secondary | ICD-10-CM | POA: Diagnosis not present

## 2020-07-29 DIAGNOSIS — I4891 Unspecified atrial fibrillation: Secondary | ICD-10-CM | POA: Diagnosis not present

## 2020-07-29 DIAGNOSIS — J309 Allergic rhinitis, unspecified: Secondary | ICD-10-CM | POA: Diagnosis not present

## 2020-07-29 DIAGNOSIS — Z853 Personal history of malignant neoplasm of breast: Secondary | ICD-10-CM | POA: Diagnosis not present

## 2020-07-29 DIAGNOSIS — I10 Essential (primary) hypertension: Secondary | ICD-10-CM | POA: Diagnosis not present

## 2020-07-29 DIAGNOSIS — M5137 Other intervertebral disc degeneration, lumbosacral region: Secondary | ICD-10-CM | POA: Diagnosis not present

## 2020-07-29 DIAGNOSIS — Z9181 History of falling: Secondary | ICD-10-CM | POA: Diagnosis not present

## 2020-07-29 DIAGNOSIS — H9190 Unspecified hearing loss, unspecified ear: Secondary | ICD-10-CM | POA: Diagnosis not present

## 2020-07-29 DIAGNOSIS — M47816 Spondylosis without myelopathy or radiculopathy, lumbar region: Secondary | ICD-10-CM | POA: Diagnosis not present

## 2020-07-29 DIAGNOSIS — R32 Unspecified urinary incontinence: Secondary | ICD-10-CM | POA: Diagnosis not present

## 2020-07-29 DIAGNOSIS — D6859 Other primary thrombophilia: Secondary | ICD-10-CM | POA: Diagnosis not present

## 2020-07-29 DIAGNOSIS — G894 Chronic pain syndrome: Secondary | ICD-10-CM | POA: Diagnosis not present

## 2020-07-29 DIAGNOSIS — K219 Gastro-esophageal reflux disease without esophagitis: Secondary | ICD-10-CM | POA: Diagnosis not present

## 2020-07-29 DIAGNOSIS — Z8711 Personal history of peptic ulcer disease: Secondary | ICD-10-CM | POA: Diagnosis not present

## 2020-07-29 DIAGNOSIS — Z86718 Personal history of other venous thrombosis and embolism: Secondary | ICD-10-CM | POA: Diagnosis not present

## 2020-07-29 DIAGNOSIS — S32501D Unspecified fracture of right pubis, subsequent encounter for fracture with routine healing: Secondary | ICD-10-CM | POA: Diagnosis not present

## 2020-07-29 DIAGNOSIS — E1142 Type 2 diabetes mellitus with diabetic polyneuropathy: Secondary | ICD-10-CM | POA: Diagnosis not present

## 2020-07-29 DIAGNOSIS — Z86711 Personal history of pulmonary embolism: Secondary | ICD-10-CM | POA: Diagnosis not present

## 2020-07-29 DIAGNOSIS — K5900 Constipation, unspecified: Secondary | ICD-10-CM | POA: Diagnosis not present

## 2020-07-29 DIAGNOSIS — Z7901 Long term (current) use of anticoagulants: Secondary | ICD-10-CM | POA: Diagnosis not present

## 2020-07-29 DIAGNOSIS — D508 Other iron deficiency anemias: Secondary | ICD-10-CM | POA: Diagnosis not present

## 2020-07-29 DIAGNOSIS — E7800 Pure hypercholesterolemia, unspecified: Secondary | ICD-10-CM | POA: Diagnosis not present

## 2020-07-30 ENCOUNTER — Ambulatory Visit (INDEPENDENT_AMBULATORY_CARE_PROVIDER_SITE_OTHER): Payer: Medicare Other | Admitting: Podiatry

## 2020-07-30 ENCOUNTER — Encounter: Payer: Self-pay | Admitting: Podiatry

## 2020-07-30 ENCOUNTER — Other Ambulatory Visit: Payer: Self-pay

## 2020-07-30 DIAGNOSIS — E0843 Diabetes mellitus due to underlying condition with diabetic autonomic (poly)neuropathy: Secondary | ICD-10-CM | POA: Diagnosis not present

## 2020-07-30 DIAGNOSIS — M79674 Pain in right toe(s): Secondary | ICD-10-CM

## 2020-07-30 DIAGNOSIS — M79675 Pain in left toe(s): Secondary | ICD-10-CM | POA: Diagnosis not present

## 2020-07-30 DIAGNOSIS — B351 Tinea unguium: Secondary | ICD-10-CM

## 2020-07-30 NOTE — Progress Notes (Signed)
   SUBJECTIVE Patient with a history of diabetes mellitus presents to office today complaining of elongated, thickened nails that cause pain while ambulating in shoes.  She is unable to trim her own nails. Patient is here for further evaluation and treatment.   Past Medical History:  Diagnosis Date  . Anemia   . Arthritis   . Atrial fibrillation ([place])   . Breast cancer ([place])    s/p lumpectomy 1992.  s/p chemo and xrt left breast  . Diabetes mellitus ([place])   . Edema    feet/legs  . Gastric ulcer   . GERD (gastroesophageal reflux disease)   . HOH (hard of hearing)    aides  . Hypercholesterolemia   . Hypertension   . Personal history of chemotherapy   . Pulmonary emboli ([place])     OBJECTIVE General Patient is awake, alert, and oriented x 3 and in no acute distress. Derm Skin is dry and supple bilateral. Negative open lesions or macerations. Remaining integument unremarkable. Nails are tender, long, thickened and dystrophic with subungual debris, consistent with onychomycosis, 1-5 bilateral. No signs of infection noted. Vasc  DP and PT pedal pulses palpable bilaterally. Temperature gradient within normal limits.  Chronic edema noted bilateral lower extremities Neuro Epicritic and protective threshold sensation diminished bilaterally.  Musculoskeletal Exam No symptomatic pedal deformities noted bilateral. Muscular strength within normal limits.  ASSESSMENT 1. Diabetes Mellitus w/ peripheral neuropathy 2.  Pain due to onychomycosis of toenails bilateral 3.  Chronic edema bilateral lower extremities  PLAN OF CARE 1. Patient evaluated today. 2. Instructed to maintain good pedal hygiene and foot care. Stressed importance of controlling blood sugar.  3. Mechanical debridement of nails 1-5 bilaterally performed using a nail nipper. Filed with dremel without incident.  4. Return to clinic in 3 mos.     Edrick Kins, DPM [facility]  Dr. Edrick Kins, DPM     [address], [place] [address]                Office [phone]  Fax [phone]

## 2020-08-03 DIAGNOSIS — M5137 Other intervertebral disc degeneration, lumbosacral region: Secondary | ICD-10-CM | POA: Diagnosis not present

## 2020-08-03 DIAGNOSIS — M48061 Spinal stenosis, lumbar region without neurogenic claudication: Secondary | ICD-10-CM | POA: Diagnosis not present

## 2020-08-03 DIAGNOSIS — G894 Chronic pain syndrome: Secondary | ICD-10-CM | POA: Diagnosis not present

## 2020-08-03 DIAGNOSIS — M47816 Spondylosis without myelopathy or radiculopathy, lumbar region: Secondary | ICD-10-CM | POA: Diagnosis not present

## 2020-08-03 DIAGNOSIS — S32501D Unspecified fracture of right pubis, subsequent encounter for fracture with routine healing: Secondary | ICD-10-CM | POA: Diagnosis not present

## 2020-08-03 DIAGNOSIS — M1990 Unspecified osteoarthritis, unspecified site: Secondary | ICD-10-CM | POA: Diagnosis not present

## 2020-08-09 DIAGNOSIS — M5137 Other intervertebral disc degeneration, lumbosacral region: Secondary | ICD-10-CM | POA: Diagnosis not present

## 2020-08-09 DIAGNOSIS — G894 Chronic pain syndrome: Secondary | ICD-10-CM | POA: Diagnosis not present

## 2020-08-09 DIAGNOSIS — M47816 Spondylosis without myelopathy or radiculopathy, lumbar region: Secondary | ICD-10-CM | POA: Diagnosis not present

## 2020-08-09 DIAGNOSIS — M1990 Unspecified osteoarthritis, unspecified site: Secondary | ICD-10-CM | POA: Diagnosis not present

## 2020-08-09 DIAGNOSIS — S32501D Unspecified fracture of right pubis, subsequent encounter for fracture with routine healing: Secondary | ICD-10-CM | POA: Diagnosis not present

## 2020-08-09 DIAGNOSIS — M48061 Spinal stenosis, lumbar region without neurogenic claudication: Secondary | ICD-10-CM | POA: Diagnosis not present

## 2020-08-18 ENCOUNTER — Other Ambulatory Visit: Payer: Self-pay | Admitting: Internal Medicine

## 2020-08-23 ENCOUNTER — Telehealth: Payer: Self-pay | Admitting: Internal Medicine

## 2020-08-23 NOTE — Telephone Encounter (Signed)
Patient called and said this pass weekend she was exposed to covid from her daughter. Patient said they were sitting 6 feet apart. Patient's appointment on 08/26/20 will be a virtual appointment.

## 2020-08-24 NOTE — Telephone Encounter (Signed)
Please call and confirm she is doing ok.  Any symptoms?

## 2020-08-24 NOTE — Telephone Encounter (Signed)
FYI patient stated that she feels absolutely fine with no symptoms whatsoever. She asked if she should quarantine, but she cannot drive anyway & daughter has Covid. I told her to make her CNA aware that comes in home & to make sure that she was wearing a mask. She knows she has telephone visit with you tomorrow at 12p.

## 2020-08-24 NOTE — Telephone Encounter (Signed)
Patient informed to quarantine for at least 10 days. She said that her CNA is aware & she wore her mask yesterday.

## 2020-08-24 NOTE — Telephone Encounter (Signed)
Yes.  She should quarantine.  Follow for symptoms.

## 2020-08-25 ENCOUNTER — Telehealth (INDEPENDENT_AMBULATORY_CARE_PROVIDER_SITE_OTHER): Payer: Medicare Other | Admitting: Internal Medicine

## 2020-08-25 DIAGNOSIS — E1149 Type 2 diabetes mellitus with other diabetic neurological complication: Secondary | ICD-10-CM

## 2020-08-25 DIAGNOSIS — Z20822 Contact with and (suspected) exposure to covid-19: Secondary | ICD-10-CM

## 2020-08-25 DIAGNOSIS — I10 Essential (primary) hypertension: Secondary | ICD-10-CM

## 2020-08-25 NOTE — Progress Notes (Signed)
Patient ID: Anita Ann R Walker, female   DOB: 1940-05-19, 80 y.o.   MRN: [id number]   Virtual Visit via video Note  This visit type was conducted due to national recommendations for restrictions regarding the COVID-19 pandemic (e.g. social distancing).  This format is felt to be most appropriate for this patient at this time.  All issues noted in this document were discussed and addressed.  No physical exam was performed (except for noted visual exam findings with Video Visits).   I connected with Anita Walker by a video enabled telemedicine application and verified that I am speaking with the correct person using two identifiers. Location patient: home Location provider: work  Persons participating in the virtual visit: patient, provider and her caretaker.   The limitations, risks, security and privacy concerns of performing an evaluation and management service by video and the availability of in person appointments have been discussed.  It has also been discussed with the patient that there may be a patient responsible charge related to this service. The patient expressed understanding and agreed to proceed.   Reason for visit: work in appt  HPI: Work in for possible exposure - covid.  Her daughter was around her a few days ago.  They were more than 6 feet apart.  The next day, her daughter was diagnosed with covid.  Ms Walker reports she feels fine.  Very minimal sinus symptoms, but this is not new.  No increased sinus pressure.  No sore throat.  No chest congestion, cough or sob.  No nausea or vomiting.  No diarrhea.     ROS: See pertinent positives and negatives per HPI.  Past Medical History:  Diagnosis Date   Anemia    Arthritis    Atrial fibrillation ([place])    Breast cancer ([place])    s/p lumpectomy 1992.  s/p chemo and xrt left breast   Diabetes mellitus ([place])    Edema    feet/legs   Gastric ulcer    GERD (gastroesophageal reflux disease)    HOH (hard of hearing)    aides    Hypercholesterolemia    Hypertension    Personal history of chemotherapy    Pulmonary emboli [facility])     Past Surgical History:  Procedure Laterality Date   BREAST LUMPECTOMY  03/06/1990   left breast   CATARACT EXTRACTION W/PHACO Right 12/26/2016   Procedure: CATARACT EXTRACTION PHACO AND INTRAOCULAR LENS PLACEMENT (IOC)-RIGHT DIABETIC;  Surgeon: Birder Robson, MD;  Location: [facility] ORS;  Service: Ophthalmology;  Laterality: Right;  [place] 00:47 AP% 24.5 CDE 11.62 Fluid pack lot # [id number]   CATARACT EXTRACTION W/PHACO Left 01/23/2017   Procedure: CATARACT EXTRACTION PHACO AND INTRAOCULAR LENS PLACEMENT (IOC);  Surgeon: Birder Robson, MD;  Location: [facility] ORS;  Service: Ophthalmology;  Laterality: Left;  [place] 00:50 AP% 16.1 CDE 8.18 Fluid pack lot #[id number]   COLONOSCOPY WITH PROPOFOL N/A 07/08/2019   Procedure: COLONOSCOPY WITH PROPOFOL;  Surgeon: Lucilla Lame, MD;  Location: [facility] ENDOSCOPY;  Service: Endoscopy;  Laterality: N/A;    Family History  Problem Relation Age of Onset   Heart disease Father    Heart disease Brother        s/p CABG   Breast cancer Daughter    Colon cancer Neg Hx     SOCIAL HX: reviewed.    Current Outpatient Medications:    acetaminophen (TYLENOL) 325 MG tablet, Take 2 tablets (650 mg total) by mouth every 6 (six) hours as needed for moderate pain (headache).,  Disp: 30 tablet, Rfl: 0   amLODipine (NORVASC) 2.5 MG tablet, TAKE 1 TABLET(2.5 MG) BY MOUTH DAILY, Disp: 90 tablet, Rfl: 1   diphenhydrAMINE (BENADRYL) 25 mg capsule, Take 25 mg at bedtime as needed by mouth for sleep. , Disp: , Rfl:    feeding supplement (ENSURE ENLIVE / ENSURE PLUS) LIQD, Take 237 mLs by mouth 2 (two) times daily between meals., Disp: 237 mL, Rfl: 12   FEROSUL 325 (65 Fe) MG tablet, TAKE 1 TABLET(325 MG) BY MOUTH DAILY, Disp: 90 tablet, Rfl: 2   glucose blood (CONTOUR TEST) test strip, USE TO TEST BLOOD SUGAR ONCE EVERY WEEK, Disp: 25 strip, Rfl: 1   losartan (COZAAR) 100 MG  tablet, TAKE 1 TABLET(100 MG) BY MOUTH DAILY, Disp: 90 tablet, Rfl: 3   Multiple Vitamin (MULTIVITAMIN WITH MINERALS) TABS tablet, Take 1 tablet daily by mouth., Disp: , Rfl:    pantoprazole (PROTONIX) 40 MG tablet, TAKE 1 TABLET(40 MG) BY MOUTH DAILY, Disp: 90 tablet, Rfl: 1   simvastatin (ZOCOR) 10 MG tablet, TAKE 1 TABLET BY MOUTH EVERY DAY, Disp: 90 tablet, Rfl: 1   traMADol (ULTRAM) 50 MG tablet, Take 1 tablet (50 mg total) by mouth every 8 (eight) hours as needed., Disp: 20 tablet, Rfl: 0  EXAM:  GENERAL: alert, oriented, appears well and in no acute distress  HEENT: atraumatic, conjunttiva clear, no obvious abnormalities on inspection of external nose and ears  NECK: normal movements of the head and neck  LUNGS: on inspection no signs of respiratory distress, breathing rate appears normal, no obvious gross SOB, gasping or wheezing  CV: no obvious cyanosis  PSYCH/NEURO: pleasant and cooperative, no obvious depression or anxiety, speech and thought processing grossly intact  ASSESSMENT AND PLAN:  Discussed the following assessment and plan:  Problem List Items Addressed This Visit     Close exposure to COVID-19 virus    Daughter diagnosed with covid.  Close exposure.  No significant symptoms.  Schedule for covid test in the next two days.  Monitor for symptoms.  Call with update.         Relevant Orders   Novel Coronavirus, NAA ([facility]) (Completed)   Hypertension    Continue losartan and amlodipine.  Follow pressures.        Type 2 diabetes mellitus with neurological complications (HCC)    Low carb diet and exercise as tolerated.  Follow met b and a1c.         Return if symptoms worsen or fail to improve.   I discussed the assessment and treatment plan with the patient. The patient was provided an opportunity to ask questions and all were answered. The patient agreed with the plan and demonstrated an understanding of the instructions.   The patient was advised  to call back or seek an in-person evaluation if the symptoms worsen or if the condition fails to improve as anticipated.   Einar Pheasant, MD

## 2020-08-27 ENCOUNTER — Other Ambulatory Visit: Payer: Medicare Other

## 2020-08-27 ENCOUNTER — Other Ambulatory Visit: Payer: Self-pay

## 2020-08-27 DIAGNOSIS — Z20822 Contact with and (suspected) exposure to covid-19: Secondary | ICD-10-CM | POA: Diagnosis not present

## 2020-08-28 LAB — SARS-COV-2, NAA 2 DAY TAT

## 2020-08-28 LAB — NOVEL CORONAVIRUS, NAA: SARS-CoV-2, NAA: NOT DETECTED

## 2020-09-05 ENCOUNTER — Encounter: Payer: Self-pay | Admitting: Internal Medicine

## 2020-09-05 NOTE — Assessment & Plan Note (Signed)
Low carb diet and exercise as tolerated.  Follow met b and a1c.  

## 2020-09-05 NOTE — Assessment & Plan Note (Signed)
Continue losartan and amlodipine.  Follow pressures.  

## 2020-09-05 NOTE — Assessment & Plan Note (Signed)
Daughter diagnosed with covid.  Close exposure.  No significant symptoms.  Schedule for covid test in the next two days.  Monitor for symptoms.  Call with update.

## 2020-09-29 DIAGNOSIS — L249 Irritant contact dermatitis, unspecified cause: Secondary | ICD-10-CM | POA: Diagnosis not present

## 2020-09-29 DIAGNOSIS — Z872 Personal history of diseases of the skin and subcutaneous tissue: Secondary | ICD-10-CM | POA: Diagnosis not present

## 2020-09-29 DIAGNOSIS — L570 Actinic keratosis: Secondary | ICD-10-CM | POA: Diagnosis not present

## 2020-09-29 DIAGNOSIS — Z859 Personal history of malignant neoplasm, unspecified: Secondary | ICD-10-CM | POA: Diagnosis not present

## 2020-09-29 DIAGNOSIS — L853 Xerosis cutis: Secondary | ICD-10-CM | POA: Diagnosis not present

## 2020-09-29 DIAGNOSIS — Z85828 Personal history of other malignant neoplasm of skin: Secondary | ICD-10-CM | POA: Diagnosis not present

## 2020-09-29 DIAGNOSIS — L578 Other skin changes due to chronic exposure to nonionizing radiation: Secondary | ICD-10-CM | POA: Diagnosis not present

## 2020-09-29 DIAGNOSIS — L821 Other seborrheic keratosis: Secondary | ICD-10-CM | POA: Diagnosis not present

## 2020-10-05 IMAGING — CT CT HEAD W/O CM
3 series · 16 of 47 positions shown, 19 images · non-contrast
Comparison: CT head 08/13/2018

CLINICAL DATA: Facial trauma.  Fall 5 days ago

EXAM:
CT HEAD WITHOUT CONTRAST
TECHNIQUE: Contiguous axial images were obtained from the base of the skull
through the vertex without intravenous contrast.

[Series 3: head wo · axial · 0.41mm/px · z∈[-257,-132]mm · 10 of 31 slices shown, 13 images]
[im 3/31  brain]
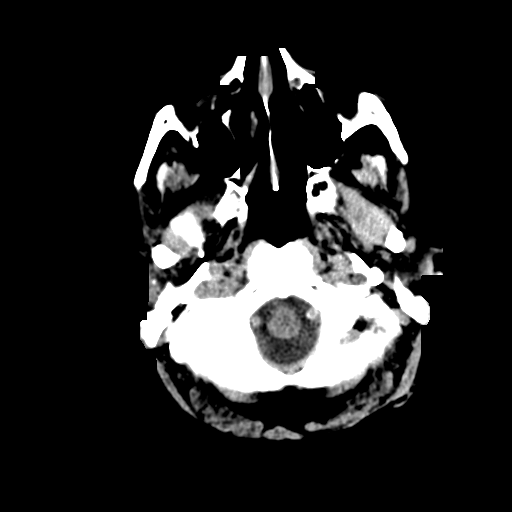
[im 3/31  bone]
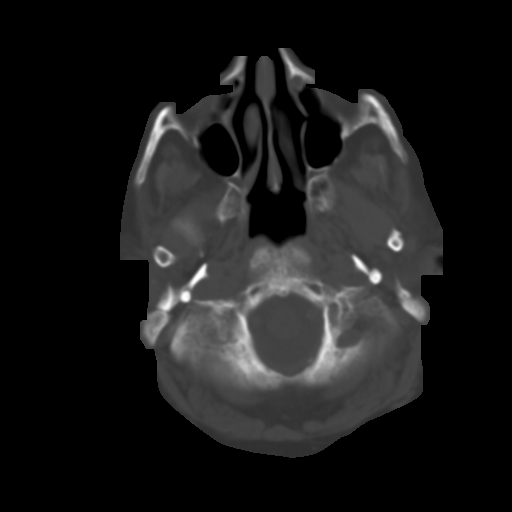
[im 6/31  brain]
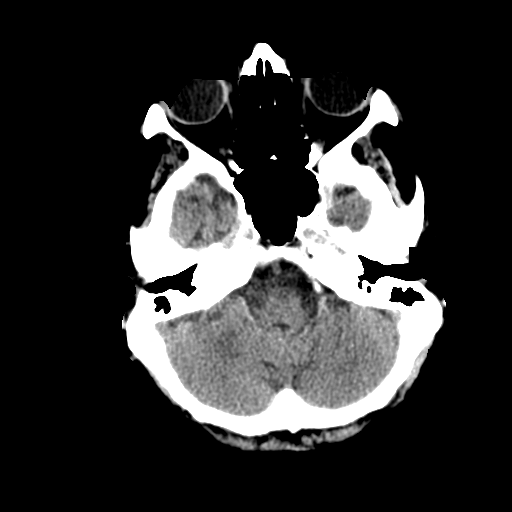
[im 9/31  brain]
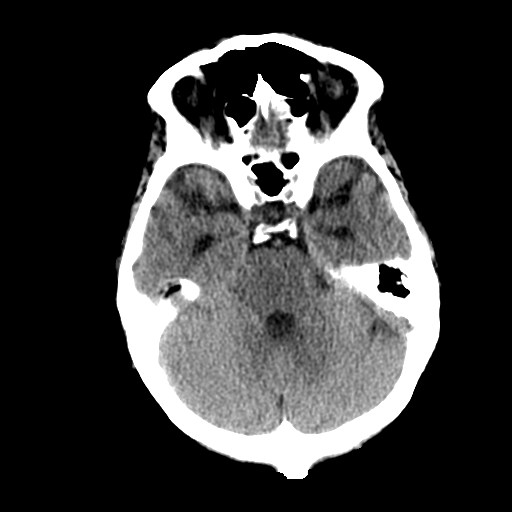
[im 11/31  brain]
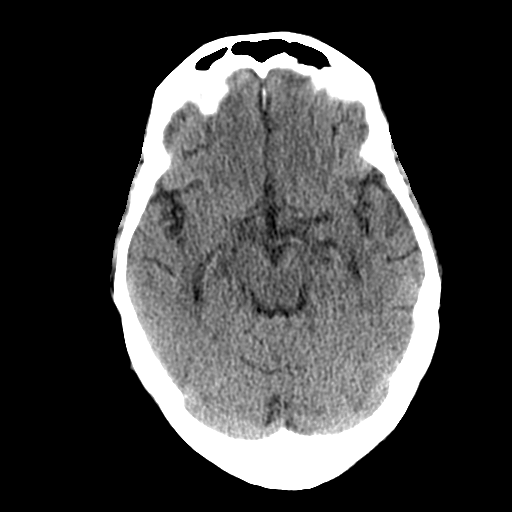
[im 14/31  brain]
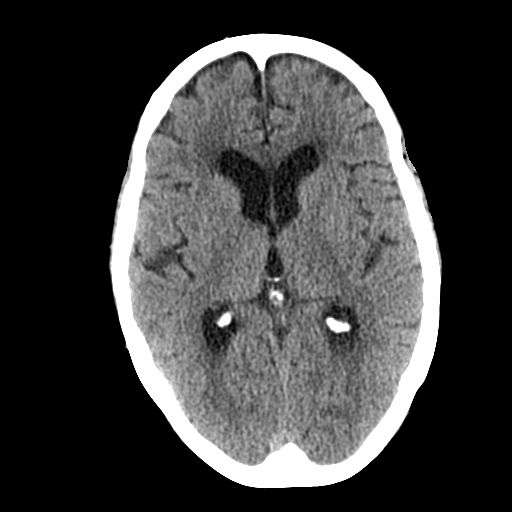
[im 14/31  bone]
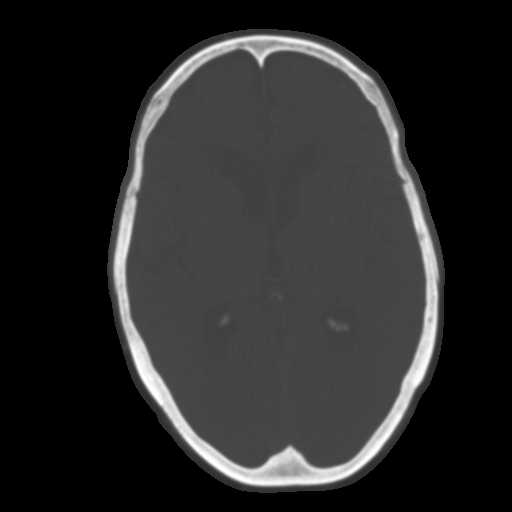
[im 17/31  brain]
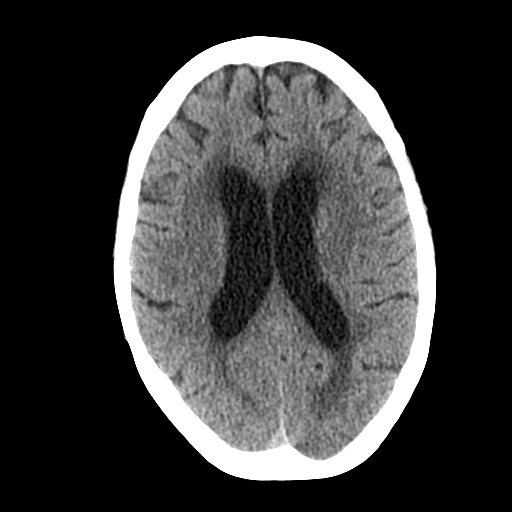
[im 20/31  brain]
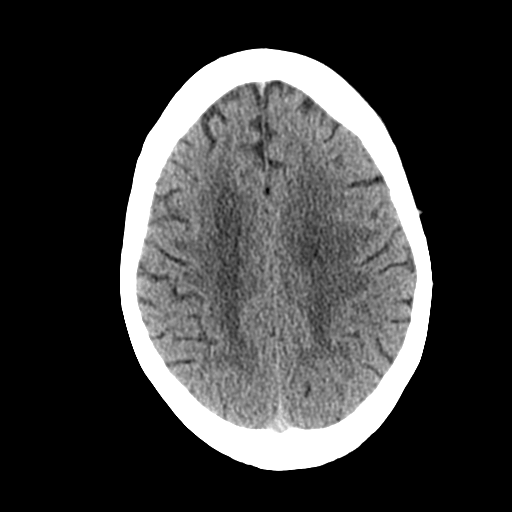
[im 23/31  brain]
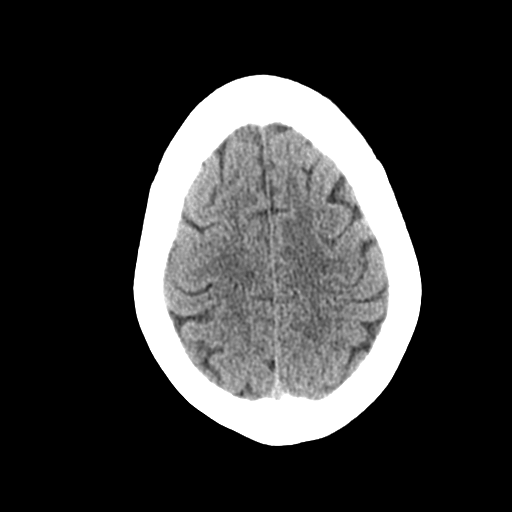
[im 25/31  brain]
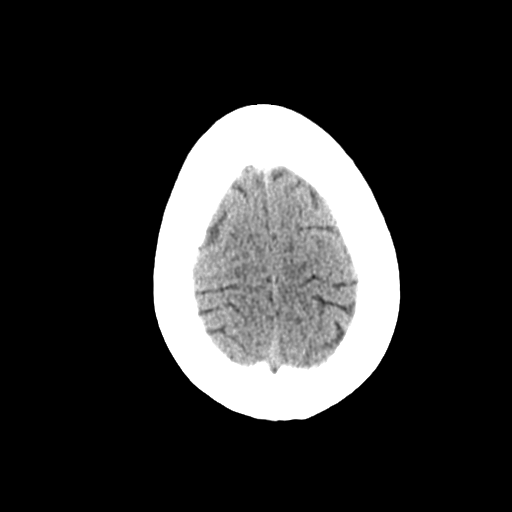
[im 25/31  bone]
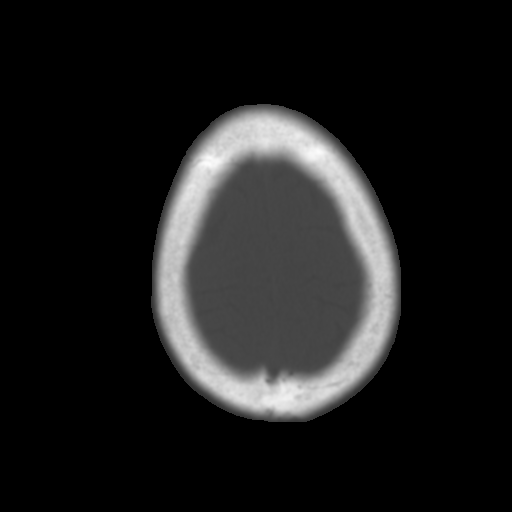
[im 28/31  brain]
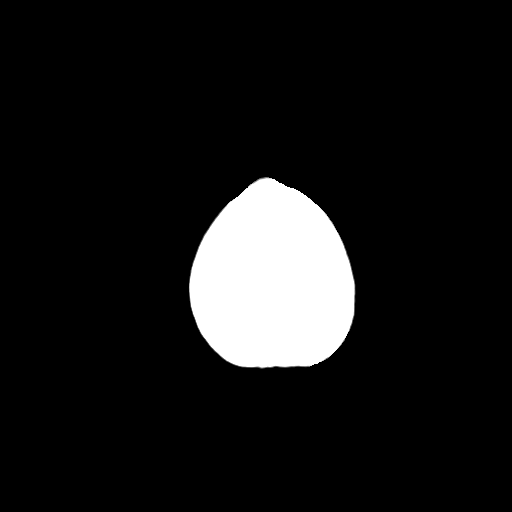

[Series 4: coronal soft tissue · coronal · 0.30mm/px · 3 of 67 slices shown]
[im 23/67  brain]
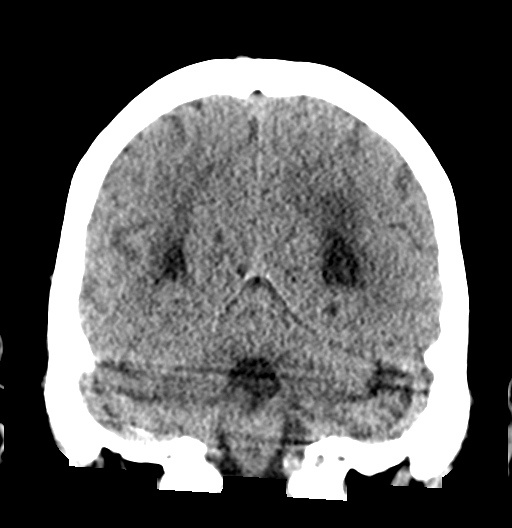
[im 30/67  brain]
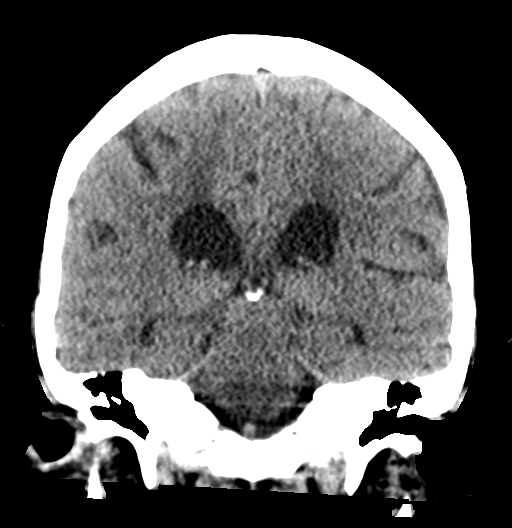
[im 37/67  brain]
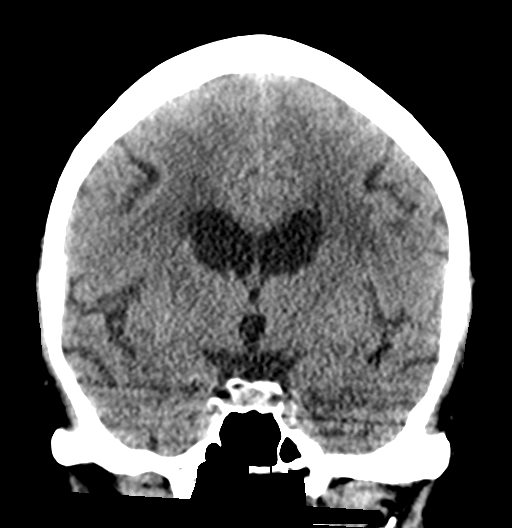

[Series 5: sagittal soft tissue · sagittal · 0.29mm/px · 3 of 50 slices shown]
[im 17/50  brain]
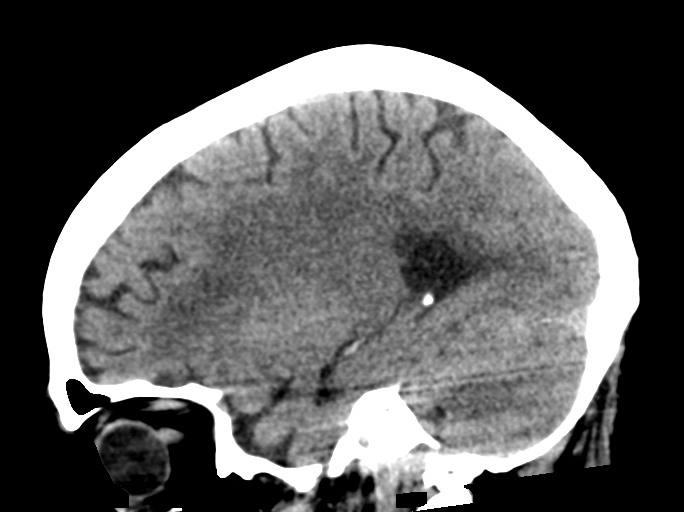
[im 25/50  brain]
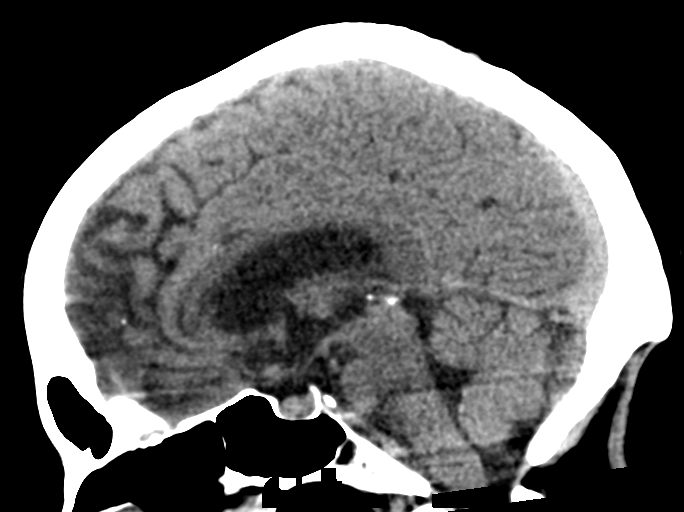
[im 33/50  brain]
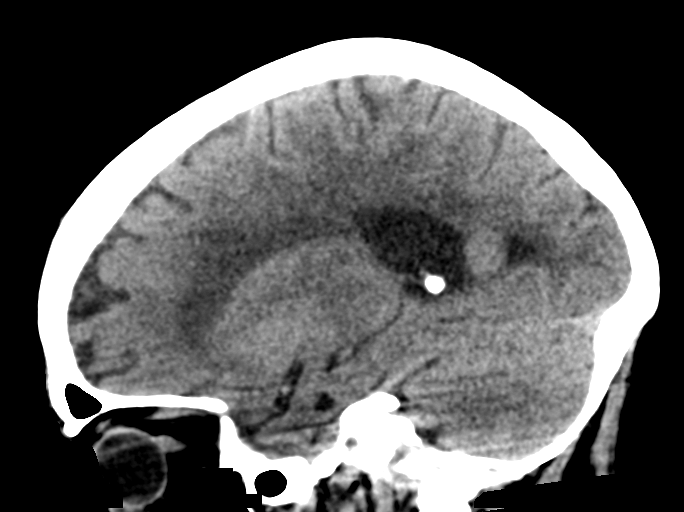

[16 of 47 positions shown; findings below may reference images not displayed]

FINDINGS: Brain: Generalized atrophy with mild ventricular enlargement, stable
from the prior study. Diffuse patchy white matter hypodensity
bilaterally unchanged.

Negative for acute infarct, hemorrhage, mass

Vascular: Negative for hyperdense vessel

Skull: Negative

Sinuses/Orbits: Paranasal sinuses clear. Bilateral cataract
extraction.

Other: None
IMPRESSION: No acute abnormality. Atrophy and chronic microvascular ischemic
changes stable from the prior study.

## 2020-10-22 ENCOUNTER — Telehealth: Payer: Self-pay | Admitting: Internal Medicine

## 2020-10-22 DIAGNOSIS — Z86711 Personal history of pulmonary embolism: Secondary | ICD-10-CM | POA: Diagnosis not present

## 2020-10-22 DIAGNOSIS — K219 Gastro-esophageal reflux disease without esophagitis: Secondary | ICD-10-CM | POA: Diagnosis not present

## 2020-10-22 DIAGNOSIS — Z86718 Personal history of other venous thrombosis and embolism: Secondary | ICD-10-CM | POA: Diagnosis not present

## 2020-10-22 DIAGNOSIS — R600 Localized edema: Secondary | ICD-10-CM | POA: Diagnosis not present

## 2020-10-22 DIAGNOSIS — Z7901 Long term (current) use of anticoagulants: Secondary | ICD-10-CM | POA: Diagnosis not present

## 2020-10-22 DIAGNOSIS — E785 Hyperlipidemia, unspecified: Secondary | ICD-10-CM | POA: Diagnosis not present

## 2020-10-22 DIAGNOSIS — I10 Essential (primary) hypertension: Secondary | ICD-10-CM | POA: Diagnosis not present

## 2020-10-22 DIAGNOSIS — Z79899 Other long term (current) drug therapy: Secondary | ICD-10-CM | POA: Diagnosis not present

## 2020-10-22 DIAGNOSIS — K921 Melena: Secondary | ICD-10-CM | POA: Diagnosis not present

## 2020-10-22 DIAGNOSIS — Z853 Personal history of malignant neoplasm of breast: Secondary | ICD-10-CM | POA: Diagnosis not present

## 2020-10-22 DIAGNOSIS — E1142 Type 2 diabetes mellitus with diabetic polyneuropathy: Secondary | ICD-10-CM | POA: Diagnosis not present

## 2020-10-22 DIAGNOSIS — Z880 Allergy status to penicillin: Secondary | ICD-10-CM | POA: Diagnosis not present

## 2020-10-22 NOTE — Telephone Encounter (Signed)
FYI patient is going to ED

## 2020-10-22 NOTE — Telephone Encounter (Signed)
Anita Walker, patient's care giver called [phone]. Patient  has a lot of blood in her stool. Christy wanted Dr Nicki Reaper to know she is taking patient to the ED at [facility].

## 2020-10-22 NOTE — Telephone Encounter (Signed)
Patient daughter stated that she went to the ER today for rectal bleeding and was told that she needs an appointment with Dr.Scott to see if she needs a colonoscopy.No openings available until October,please advise.

## 2020-10-22 NOTE — Telephone Encounter (Signed)
Anita Walker is in ER now.

## 2020-10-25 NOTE — Telephone Encounter (Signed)
PT called stating that is suppose to get a call today in regards to the rectal bleeding. Please advise.

## 2020-10-25 NOTE — Telephone Encounter (Signed)
Spoke with patient and Caregiver no bright red blood since Friday and stools are brown in color today. Patient was advised by [facility] to see PCP within 5 days for possible colonoscopy referral. NO appointment s available in that time or time patient has ride available on Mon. Wed, or Fri. From 9-12 only time she has driver.

## 2020-10-26 NOTE — Telephone Encounter (Signed)
Pt stated that her last bowel movement which was late yesterday evening had no blood in it that she could tell and she is not having any other acute symptoms. Pt stated that if you thought she needed further evaluation by GI then she is fine with the referral but she can only go on Monday, Wednesday, and Friday between 9 and 12.

## 2020-10-26 NOTE — Telephone Encounter (Signed)
Reviewed note.  Was seen in ER for rectal bleeding.  Confirm has not had any further bleeding.  Any other acute symptoms?  Can refer to GI for further evaluation.  Previously saw Dr Allen Norris.

## 2020-10-27 NOTE — Telephone Encounter (Signed)
Spoke with Alyse Low, patients caretaker and scheduled an office appt with Dr Allen Norris on Wednesday, Sept 14th. Alyse Low stated since the bleeding is not daily and she isn't having any abdominal pain, she doesn't feel this is urgent and can wait until Sept 14th. Advised her if she develops any symptoms, she is to call me back for a sooner appt.

## 2020-10-27 NOTE — Telephone Encounter (Signed)
Dr Allen Norris has seen Anita Walker previously.  Anita Walker was recently evaluated [facility] ED - for rectal bleeding.  They had recommended a f/u appt with GI.  I wanted to send this information and see if we could get a f/u appt scheduled with Dr Allen Norris.  I knew you worked with him.  If I need to send this message to someone else to schedule the appt, just let me know.  Thank you for your help.  Dr Nicki Reaper

## 2020-10-28 ENCOUNTER — Ambulatory Visit: Payer: Medicare Other | Admitting: Podiatry

## 2020-10-29 ENCOUNTER — Other Ambulatory Visit: Payer: Self-pay | Admitting: Internal Medicine

## 2020-11-05 ENCOUNTER — Other Ambulatory Visit: Payer: Self-pay | Admitting: Internal Medicine

## 2020-11-10 DIAGNOSIS — Z23 Encounter for immunization: Secondary | ICD-10-CM | POA: Diagnosis not present

## 2020-11-15 ENCOUNTER — Encounter: Payer: Self-pay | Admitting: Podiatry

## 2020-11-15 ENCOUNTER — Telehealth: Payer: Self-pay | Admitting: Internal Medicine

## 2020-11-15 ENCOUNTER — Other Ambulatory Visit: Payer: Self-pay

## 2020-11-15 ENCOUNTER — Ambulatory Visit (INDEPENDENT_AMBULATORY_CARE_PROVIDER_SITE_OTHER): Payer: Medicare Other | Admitting: Podiatry

## 2020-11-15 DIAGNOSIS — S39012A Strain of muscle, fascia and tendon of lower back, initial encounter: Secondary | ICD-10-CM | POA: Insufficient documentation

## 2020-11-15 DIAGNOSIS — M79674 Pain in right toe(s): Secondary | ICD-10-CM | POA: Diagnosis not present

## 2020-11-15 DIAGNOSIS — D689 Coagulation defect, unspecified: Secondary | ICD-10-CM | POA: Diagnosis not present

## 2020-11-15 DIAGNOSIS — B351 Tinea unguium: Secondary | ICD-10-CM | POA: Diagnosis not present

## 2020-11-15 DIAGNOSIS — M545 Low back pain, unspecified: Secondary | ICD-10-CM | POA: Diagnosis not present

## 2020-11-15 DIAGNOSIS — M47896 Other spondylosis, lumbar region: Secondary | ICD-10-CM | POA: Diagnosis not present

## 2020-11-15 DIAGNOSIS — E0843 Diabetes mellitus due to underlying condition with diabetic autonomic (poly)neuropathy: Secondary | ICD-10-CM

## 2020-11-15 DIAGNOSIS — M47816 Spondylosis without myelopathy or radiculopathy, lumbar region: Secondary | ICD-10-CM | POA: Insufficient documentation

## 2020-11-15 DIAGNOSIS — M79675 Pain in left toe(s): Secondary | ICD-10-CM | POA: Diagnosis not present

## 2020-11-15 NOTE — Telephone Encounter (Signed)
Please cal get more information.

## 2020-11-15 NOTE — Progress Notes (Signed)
This patient returns to my office for at risk foot care.  This patient requires this care by a professional since this patient will be at risk due to having type 2 diabetes and coagulation defect   Patient is taking coumadin. This patient is unable to cut nails herself since the patient cannot reach her nails.These nails are painful walking and wearing shoes.  This patient presents for at risk foot care today.  General Appearance  Alert, conversant and in no acute stress.  Vascular  Dorsalis pedis and posterior tibial  pulses are palpable  bilaterally.  Capillary return is within normal limits  bilaterally. Temperature is within normal limits  bilaterally.  Neurologic  Senn-Weinstein monofilament wire test diminished   bilaterally. Muscle power within normal limits bilaterally.  Nails Thick disfigured discolored nails with subungual debris  from hallux to fifth toes bilaterally. No evidence of bacterial infection or drainage bilaterally.  Orthopedic  No limitations of motion  feet .  No crepitus or effusions noted.  No bony pathology or digital deformities noted.  Skin  normotropic skin with no porokeratosis noted bilaterally.  No signs of infections or ulcers noted.     Onychomycosis  Pain in right toes  Pain in left toes  Consent was obtained for treatment procedures.   Mechanical debridement of nails 1-5  bilaterally performed with a nail nipper.  Filed with dremel without incident.    Return office visit    102weeks                  Told patient to return for periodic foot care and evaluation due to potential at risk complications.   Gardiner Barefoot DPM

## 2020-11-15 NOTE — Telephone Encounter (Signed)
The patient's daughter called back to find out the reason for taking her mother to the urgent care. The patient stated she did not know the reason . I read the note Dr. Nicki Reaper left to her and she stated she will take her to the [facility] urgent care across the street from [facility].

## 2020-11-15 NOTE — Telephone Encounter (Signed)
Spoken to patient, instructed patient of what PCP recommended. Patient stated she has no way of going to be evaluated. She instructed me to call her daughter Cala Bradford. Left detailed message informing the daughter of what the provider recommended and that patient wanted me to call her and inform her that she needed to go be evaluated .

## 2020-11-15 NOTE — Telephone Encounter (Signed)
With her history and acute pain, per note, unable to walk yesterday - needs to be evaluated so can determine etiology of pain.  Recommend evaluation today.

## 2020-11-15 NOTE — Telephone Encounter (Signed)
Patient stated her lower back started hurting yesterday, patient has appointment with urologist this Wednesday. Patient hasn't seen any blood in urine. Left Lower back pain. No MOI, numbness, tingling, nausea, vomiting, diarrhea, black or tarry stools. Patient stated her recliner is helping her pain today.Nothing else relieves it Unable to lay down. Taken tylenol for her pain.

## 2020-11-15 NOTE — Telephone Encounter (Signed)
Patient caregiver calling in and stating Patient could be having possible left side sciatic pain. No known injuries or falls. States the pain radiates down to the top of her behind.   Pain yesterday was to the point of being unable to walk. Pain worse when lying flat. Pain not has sever today but still present.   No available appointments this week Please advise

## 2020-11-17 ENCOUNTER — Encounter: Payer: Self-pay | Admitting: Gastroenterology

## 2020-11-17 ENCOUNTER — Ambulatory Visit (INDEPENDENT_AMBULATORY_CARE_PROVIDER_SITE_OTHER): Payer: Medicare Other | Admitting: Gastroenterology

## 2020-11-17 VITALS — BP 145/84 | HR 80 | Temp 97.3°F | Ht 69.0 in | Wt 242.0 lb

## 2020-11-17 DIAGNOSIS — K921 Melena: Secondary | ICD-10-CM

## 2020-11-17 NOTE — Progress Notes (Signed)
Primary Care Physician: Einar Pheasant, MD  Primary Gastroenterologist:  Dr. Lucilla Lame  Chief Complaint  Patient presents with   Rectal Bleeding     HPI: Anita Walker is a 80 y.o. female here with a history of having a positive Cologuard test in the past and underwent a colonoscopy by me in 2021.  At that time the patient was found to have:  - Diverticulosis in the sigmoid colon. - Two 4 to 5 mm polyps in the descending colon, removed with a cold snare. Resected and retrieved. - One 3 mm polyp in the cecum, removed with a cold snare. Resected and retrieved. - Non-bleeding internal hemorrhoids.  The polyps were found to be tubular adenomas.  The patient's most recent blood work showed her hemoglobin and hematocrit to be normal in August of this year. The patient's caregiver states that the blood was bright red blood per rectum and it happened only once with some passage of the remnants of the blood pressure time afterwards.  The patient has not had any further rectal bleeding and denies any abdominal pain while this bleeding was taking place.  There is no report of any unexplained weight loss fevers chills nausea or vomiting.  Past Medical History:  Diagnosis Date   Anemia    Arthritis    Atrial fibrillation ([place])    Breast cancer ([place])    s/p lumpectomy 1992.  s/p chemo and xrt left breast   Diabetes mellitus ([place])    Edema    feet/legs   Gastric ulcer    GERD (gastroesophageal reflux disease)    HOH (hard of hearing)    aides   Hypercholesterolemia    Hypertension    Personal history of chemotherapy    Pulmonary emboli ([facility])     Current Outpatient Medications  Medication Sig Dispense Refill   acetaminophen (TYLENOL) 325 MG tablet Take 2 tablets (650 mg total) by mouth every 6 (six) hours as needed for moderate pain (headache). 30 tablet 0   amLODipine (NORVASC) 2.5 MG tablet TAKE 1 TABLET(2.5 MG) BY MOUTH DAILY 90 tablet 1   diphenhydrAMINE (BENADRYL) 25 mg  capsule Take 25 mg at bedtime as needed by mouth for sleep.      feeding supplement (ENSURE ENLIVE / ENSURE PLUS) LIQD Take 237 mLs by mouth 2 (two) times daily between meals. 237 mL 12   FEROSUL 325 (65 Fe) MG tablet TAKE 1 TABLET(325 MG) BY MOUTH DAILY 90 tablet 2   glucose blood (CONTOUR TEST) test strip USE TO TEST BLOOD SUGAR ONCE EVERY WEEK 25 strip 1   losartan (COZAAR) 100 MG tablet TAKE 1 TABLET(100 MG) BY MOUTH DAILY 90 tablet 3   Multiple Vitamin (MULTIVITAMIN WITH MINERALS) TABS tablet Take 1 tablet daily by mouth.     pantoprazole (PROTONIX) 40 MG tablet TAKE 1 TABLET(40 MG) BY MOUTH DAILY 90 tablet 1   simvastatin (ZOCOR) 10 MG tablet TAKE 1 TABLET BY MOUTH EVERY DAY 90 tablet 1   traMADol (ULTRAM) 50 MG tablet Take 1 tablet (50 mg total) by mouth every 8 (eight) hours as needed. 20 tablet 0   No current facility-administered medications for this visit.    Allergies as of 11/17/2020 - Review Complete 11/17/2020  Allergen Reaction Noted   Penicillins Other (See Comments) 01/01/2012   Penicillin v potassium Nausea And Vomiting 02/09/2015    ROS:  General: Negative for anorexia, weight loss, fever, chills, fatigue, weakness. ENT: Negative for hoarseness, difficulty swallowing ,  nasal congestion. CV: Negative for chest pain, angina, palpitations, dyspnea on exertion, peripheral edema.  Respiratory: Negative for dyspnea at rest, dyspnea on exertion, cough, sputum, wheezing.  GI: See history of present illness. GU:  Negative for dysuria, hematuria, urinary incontinence, urinary frequency, nocturnal urination.  Endo: Negative for unusual weight change.    Physical Examination:   BP (!) 145/84 (BP Location: Right Arm, Patient Position: Sitting, Cuff Size: Large)   Pulse 80   Temp (!) 97.3 F (36.3 C) (Temporal)   Ht '5\' 9"'$  (1.753 m)   Wt 242 lb (109.8 kg)   BMI 35.74 kg/m   General: Well-nourished, well-developed in no acute distress.  Eyes: No icterus. Conjunctivae  pink. Neuro: Alert and oriented x 3.  Grossly intact. Skin: Warm and dry, no jaundice.   Psych: Alert and cooperative, normal mood and affect.  Labs:    Imaging Studies: No results found.  Assessment and Plan:   Anita Walker is a 80 y.o. y/o female who comes in today with a history of having a colonoscopy with diverticulosis and internal hemorrhoids.  The patient had a syncopal episode of rectal bleeding with bright red blood per rectum.  The patient and her caregiver were concerned at the possible cause for the rectal bleeding.  The patient has been told that it is likely hemorrhoids but all of diverticulosis can bleed is usually much more significant than what had happened.  The patient's hemoglobin was stable.  The patient has been told that since she has had no further bleedings we will continue to monitor without any further intervention.  If the patient should have any further bleeding then she may need hemorrhoidal banding versus medical therapy with Anusol suppositories.  The patient has been explained the plan and agrees with it.     Lucilla Lame, MD. Marval Regal    Note: This dictation was prepared with Dragon dictation along with smaller phrase technology. Any transcriptional errors that result from this process are unintentional.

## 2020-11-22 ENCOUNTER — Other Ambulatory Visit: Payer: Self-pay | Admitting: Internal Medicine

## 2020-11-22 DIAGNOSIS — Z1231 Encounter for screening mammogram for malignant neoplasm of breast: Secondary | ICD-10-CM

## 2020-12-13 NOTE — Progress Notes (Signed)
PROVIDER NOTE: Information contained herein reflects review and annotations entered in association with encounter. Interpretation of such information and data should be left to medically-trained personnel. Information provided to patient can be located elsewhere in the medical record under "Patient Instructions". Document created using STT-dictation technology, any transcriptional errors that may result from process are unintentional.    Patient: Anita Walker  Service Category: E/M  Provider: Gaspar Cola, MD  DOB: 1940/03/24  DOS: 12/15/2020  Specialty: Interventional Pain Management  MRN: [id number]  Setting: Ambulatory outpatient  PCP: Einar Pheasant, MD  Type: Established Patient    Referring Provider: Einar Pheasant, MD  Location: Office  Delivery: Face-to-face     HPI  [name], a 80 y.o. year old female, is here today because of her Chronic bilateral low back pain without sciatica [M54.50, G89.29]. [name] primary complain today is Back Pain (mid) Last encounter: My last encounter with her was on Visit date not found. Pertinent problems: 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 Pain Assessment: Severity of Chronic pain is reported as a 3 /10. Location: Back Mid/low back. Onset: More than a month ago. Quality:  ("Just hurts all the time."). Timing: Intermittent. Modifying factor(s): Tylenol, ice, rest. Vitals:  height is 5' 8" (1.727 m) and weight is 242 lb (109.8 kg). Her temporal temperature is 97.3 F (36.3 C) (abnormal). Her blood pressure is 151/68 (abnormal) and her pulse is 82. Her respiration is 18 and oxygen saturation is 100%.   Reason for encounter: worsening of previously known (established) problem.  The patient comes into the clinic today with a healthcare aide and they refer that she had a fall around February 2022.  She ended up at [facility], where she was discharged around April.  She is currently experiencing no pain but she refers that is primarily because she is not moving.  A while ago she was experiencing a flareup of her low back pain that was described to be in the midline with some referred pain towards her buttocks area.  However she was given a steroid pack which did work in relieving the pain and again currently she is not experiencing any.  She does have some tramadol available, but she is not taking it.  They refer that she occasionally will take some Tylenol when she needs it, but that is it.  They essentially wanted to come in to let me know and put me up-to-date with regards to what was going on so that if by any chance she had a flareup, but we could assist in her care.  I told them that whenever there is a flareup of the pain, just give [place] a call and we will assess  what is going on and take appropriate steps depending on what the problem is at that time.  Pharmacotherapy Assessment  Analgesic: None.  Highest recorded MME/day: 25 mg/day MME/day: 0  mg/day   Monitoring: [place] PMP: PDMP reviewed during this encounter.       Pharmacotherapy: No side-effects or adverse reactions reported. Compliance: No problems identified. Effectiveness: Clinically acceptable.  [name], RN  12/15/2020  9:18 AM  Signed Safety precautions to be maintained throughout the outpatient stay will include: orient to surroundings, keep bed in low position, maintain call bell within reach at all times, provide assistance with transfer out of bed and ambulation.     UDS:  No results found for: SUMMARY   ROS  Constitutional: Denies any fever or chills Gastrointestinal: No reported hemesis, hematochezia, vomiting, or acute GI distress Musculoskeletal: Denies any acute onset joint swelling, redness, loss of ROM, or weakness Neurological: No reported episodes of acute onset apraxia, aphasia, dysarthria, agnosia, amnesia, paralysis, loss of coordination, or loss of consciousness  Medication Review  acetaminophen, amLODipine, diphenhydrAMINE, feeding supplement, ferrous sulfate, glucose blood, losartan, multivitamin with minerals, pantoprazole, simvastatin, and traMADol  History Review  Allergy: [name] is allergic to penicillins and penicillin v potassium. Drug: [name]  reports no history of drug use. Alcohol:  reports no history of alcohol use. Tobacco:  reports that she has never smoked. She has never used smokeless tobacco. Social: [name]  reports that she has never smoked. She has never used smokeless tobacco. She reports that she does not drink alcohol and does not use drugs. Medical:  has a past medical history of Anemia, Arthritis, Atrial fibrillation ([place]), Breast cancer ([place]), Diabetes mellitus ([place]), Edema, Gastric ulcer, GERD (gastroesophageal reflux disease), HOH (hard of hearing), Hypercholesterolemia, Hypertension, Personal history of chemotherapy, and Pulmonary emboli ([place]). Surgical: [name]  has a past surgical history that  includes Cataract extraction w/PHACO (Right, 12/26/2016); Cataract extraction w/PHACO (Left, 01/23/2017); Breast lumpectomy (03/06/1990); and Colonoscopy with propofol (N/A, 07/08/2019). Family: family history includes Breast cancer in her daughter; Heart disease in her brother and father.  Laboratory Chemistry Profile   Renal Lab Results  Component Value Date   BUN 14 07/22/2020   CREATININE 0.71 07/22/2020   BCR 13 03/25/2020   GFR 79.23 07/22/2020   GFRAA 67 03/25/2020   GFRNONAA >60 04/16/2020    Hepatic Lab Results  Component Value Date   AST 14 07/22/2020   ALT 10 07/22/2020   ALBUMIN 3.9 07/22/2020   ALKPHOS 72 07/22/2020    Electrolytes Lab Results  Component Value Date   NA 137 07/22/2020   K 4.4 07/22/2020   CL 102 07/22/2020   CALCIUM 9.4 07/22/2020    Bone No results found for: VD25OH, VD125OH2TOT, EG3151VO1, YW7371GG2, 25OHVITD1, 25OHVITD2, 25OHVITD3, TESTOFREE, TESTOSTERONE  Inflammation (CRP: Acute Phase) (ESR: Chronic Phase) Lab Results  Component Value Date   CRP <0.8 10/31/2016   ESRSEDRATE 5 10/31/2016   LATICACIDVEN 1.1 10/30/2016         Note: Above Lab results reviewed.  Recent Imaging Review  DG Hip Unilat W or Wo Pelvis 2-3 Views Right CLINICAL DATA:  Fall  EXAM: DG HIP (WITH OR WITHOUT PELVIS) 2-3V RIGHT  COMPARISON:  None.  FINDINGS: Fracture through the right superior and inferior pubic rami. The superior pubic ramus fracture is near the medial wall of the right acetabulum. No proximal femoral fracture, subluxation or dislocation. Mild degenerative changes in the hips.  IMPRESSION: Right superior and inferior pubic rami  fractures.  Electronically Signed   By: Rolm Baptise M.D.   On: 04/15/2020 19:03 DG Lumbar Spine 2-3 Views CLINICAL DATA:  Fall  EXAM: LUMBAR SPINE - 2-3 VIEW  COMPARISON:  12/11/2019  FINDINGS: Moderate compression fracture involving, stable since prior the L3 vertebral body study. Diffuse degenerative  disc and facet disease. No acute fracture. Normal alignment.  IMPRESSION: Stable chronic L3 compression fracture.  Spondylosis.  No acute findings.  Electronically Signed   By: Rolm Baptise M.D.   On: 04/15/2020 19:02 CT Cervical Spine Wo Contrast CLINICAL DATA:  Fall, landing on her bottom. Denies hitting her head. No neck pain.  EXAM: CT HEAD WITHOUT CONTRAST  CT CERVICAL SPINE WITHOUT CONTRAST  TECHNIQUE: Multidetector CT imaging of the head and cervical spine was performed following the standard protocol without intravenous contrast. Multiplanar CT image reconstructions of the cervical spine were also generated.  COMPARISON:  CT head and cervical spine dated March 19, 2020.  FINDINGS: CT HEAD FINDINGS  Brain: No evidence of acute infarction, hemorrhage, hydrocephalus, extra-axial collection or mass lesion/mass effect. Stable atrophy and chronic microvascular ischemic changes.  Vascular: Calcified atherosclerosis at the skullbase. No hyperdense vessel.  Skull: Normal. Negative for fracture or focal lesion.  Sinuses/Orbits: No acute finding.  Other: None.  CT CERVICAL SPINE FINDINGS  Alignment: No traumatic malalignment. Unchanged trace anterolisthesis at C3-C4 and C4-C5.  Skull base and vertebrae: No acute fracture. No primary bone lesion or focal pathologic process.  Soft tissues and spinal canal: No prevertebral fluid or swelling. No visible canal hematoma.  Disc levels: Unchanged moderate disc height loss and uncovertebral hypertrophy at C5-C6 and C6-C7. Unchanged diffuse facet arthropathy, advanced on the left at C3-C4 and C4-C5.  Upper chest: Negative.  Other: 1.3 cm hypodense nodule in the right thyroid gland. Not clinically significant; no follow-up imaging recommended.  IMPRESSION: 1. No acute intracranial abnormality. Stable atrophy and chronic microvascular ischemic changes. 2. No acute cervical spine fracture or traumatic  listhesis. 3. Unchanged moderate cervical spondylosis.  Electronically Signed   By: Titus Dubin M.D.   On: 04/15/2020 18:34 CT Head Wo Contrast CLINICAL DATA:  Fall, landing on her bottom. Denies hitting her head. No neck pain.  EXAM: CT HEAD WITHOUT CONTRAST  CT CERVICAL SPINE WITHOUT CONTRAST  TECHNIQUE: Multidetector CT imaging of the head and cervical spine was performed following the standard protocol without intravenous contrast. Multiplanar CT image reconstructions of the cervical spine were also generated.  COMPARISON:  CT head and cervical spine dated March 19, 2020.  FINDINGS: CT HEAD FINDINGS  Brain: No evidence of acute infarction, hemorrhage, hydrocephalus, extra-axial collection or mass lesion/mass effect. Stable atrophy and chronic microvascular ischemic changes.  Vascular: Calcified atherosclerosis at the skullbase. No hyperdense vessel.  Skull: Normal. Negative for fracture or focal lesion.  Sinuses/Orbits: No acute finding.  Other: None.  CT CERVICAL SPINE FINDINGS  Alignment: No traumatic malalignment. Unchanged trace anterolisthesis at C3-C4 and C4-C5.  Skull base and vertebrae: No acute fracture. No primary bone lesion or focal pathologic process.  Soft tissues and spinal canal: No prevertebral fluid or swelling. No visible canal hematoma.  Disc levels: Unchanged moderate disc height loss and uncovertebral hypertrophy at C5-C6 and C6-C7. Unchanged diffuse facet arthropathy, advanced on the left at C3-C4 and C4-C5.  Upper chest: Negative.  Other: 1.3 cm hypodense nodule in the right thyroid gland. Not clinically significant; no follow-up imaging recommended.  IMPRESSION: 1. No acute intracranial abnormality. Stable atrophy and chronic microvascular ischemic changes. 2.  No acute cervical spine fracture or traumatic listhesis. 3. Unchanged moderate cervical spondylosis.  Electronically Signed   By: Titus Dubin M.D.   On:  04/15/2020 18:34 Note: Reviewed        Physical Exam  General appearance: Well nourished, well developed, and well hydrated. In no apparent acute distress Mental status: Alert, oriented x 3 (person, place, & time)       Respiratory: No evidence of acute respiratory distress Eyes: PERLA Vitals: BP (!) 151/68 (BP Location: Right Arm, Patient Position: Sitting, Cuff Size: Large)   Pulse 82   Temp (!) 97.3 F (36.3 C) (Temporal)   Resp 18   Ht 5' 8" (1.727 m)   Wt 242 lb (109.8 kg)   SpO2 100%   BMI 36.80 kg/m  BMI: Estimated body mass index is 36.8 kg/m as calculated from the following:   Height as of this encounter: 5' 8" (1.727 m).   Weight as of this encounter: 242 lb (109.8 kg). Ideal: Ideal body weight: 63.9 kg (140 lb 14 oz) Adjusted ideal body weight: 82.2 kg (181 lb 5.2 oz)  Assessment   Status Diagnosis  Controlled Controlled Controlled 1. Chronic low back pain (Bilateral) (R>L) w/o sciatica   2. 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 Baastrup's syndrome (3-S1)   4. DDD (degenerative disc disease), lumbosacral   5. Grade 1 Retrolisthesis of L3/L4   6. Kissing spine syndrome (L3-S1)   7. L5-S1 disc-osteophyte complex (Right)   8. Chronic anticoagulation (Coumadin)   9. At high risk for falls   10. Closed superior and inferior pubic rami fractures, sequela (Right)   11. Other intervertebral disc degeneration, lumbar region   12. Compression fracture of thoracolumbar vertebra (T11), sequela      Updated Problems: Problem  Closed superior and inferior pubic rami fractures, sequela (Right)  Low Back Strain  Lumbar Spondylosis    Plan of Care  Problem-specific:  No problem-specific Assessment & Plan notes found for this encounter.  Ms. Citlally Captain Walker has a current medication list which includes the following long-term medication(s): amlodipine, diphenhydramine, ferosul, losartan, pantoprazole, and  simvastatin.  Pharmacotherapy (Medications Ordered): No orders of the defined types were placed in this encounter.  Orders:  No orders of the defined types were placed in this encounter.  Follow-up plan:   Return if symptoms worsen or fail to improve.     Interventional Therapies  Risk  Complexity Considerations:   Estimated body mass index is 36.8 kg/m as calculated from the following:   Height as of this encounter: 5' 8" (1.727 m).   Weight as of this encounter: 242 lb (109.8 kg). NOTE:  Fast-track referral COUMADIN ANTICOAGULATION (Stop: 5 days  Restart: 2 hrs)  NO MORE RFA - Poor intra-procedure compliance leading to suboptimal outcomes (<1 month benefit) History of vasovagal with IV access.   Planned  Pending:   Diagnostic right superior and inferior pubic rami fracture steroid injection    Under consideration:   Palliative interventions only.  (See below)   Completed:   Diagnostic (ML) interspinous process injection of L3-4, L4-5, and L5-S1 x1 (01/27/2020)  Palliative right lumbar facet MBB x2 (08/14/2019)  Palliative left lumbar facet MBB x3 (01/01/2020)  Therapeutic right lumbar facet RFA x1 (09/18/2019) (NO MORE RFA - Poor intra-op compliance)  Therapeutic left lumbar facet RFA X1 (11/18/2019) (NO MORE RFA - Unreliable feedback on testing)    Therapeutic  Palliative (PRN) options:  Palliative right lumbar facet MBB #3  Palliative left lumbar facet MBB #4     Recent Visits No visits were found meeting these conditions. Showing recent visits within past 90 days and meeting all other requirements Today's Visits Date Type Provider Dept  12/15/20 Office Visit Milinda Pointer, MD [facility]  Showing today's visits and meeting all other requirements Future Appointments No visits were found meeting these conditions. Showing future appointments within next 90 days and meeting all other requirements I discussed the assessment and treatment plan with  the patient. The patient was provided an opportunity to ask questions and all were answered. The patient agreed with the plan and demonstrated an understanding of the instructions.  Patient advised to call back or seek an in-person evaluation if the symptoms or condition worsens.  Duration of encounter: 30 minutes.  Note by: Gaspar Cola, MD Date: 12/15/2020; Time: 9:39 AM

## 2020-12-15 ENCOUNTER — Other Ambulatory Visit: Payer: Self-pay

## 2020-12-15 ENCOUNTER — Encounter: Payer: Self-pay | Admitting: Pain Medicine

## 2020-12-15 ENCOUNTER — Ambulatory Visit: Payer: Medicare Other | Attending: Pain Medicine | Admitting: Pain Medicine

## 2020-12-15 ENCOUNTER — Ambulatory Visit: Payer: Medicare Other

## 2020-12-15 VITALS — BP 151/68 | HR 82 | Temp 97.3°F | Resp 18 | Ht 68.0 in | Wt 242.0 lb

## 2020-12-15 DIAGNOSIS — Z7901 Long term (current) use of anticoagulants: Secondary | ICD-10-CM | POA: Insufficient documentation

## 2020-12-15 DIAGNOSIS — S32010S Wedge compression fracture of first lumbar vertebra, sequela: Secondary | ICD-10-CM | POA: Diagnosis not present

## 2020-12-15 DIAGNOSIS — S32030S Wedge compression fracture of third lumbar vertebra, sequela: Secondary | ICD-10-CM | POA: Insufficient documentation

## 2020-12-15 DIAGNOSIS — M4310 Spondylolisthesis, site unspecified: Secondary | ICD-10-CM | POA: Diagnosis not present

## 2020-12-15 DIAGNOSIS — S22080S Wedge compression fracture of T11-T12 vertebra, sequela: Secondary | ICD-10-CM | POA: Insufficient documentation

## 2020-12-15 DIAGNOSIS — Z9181 History of falling: Secondary | ICD-10-CM | POA: Insufficient documentation

## 2020-12-15 DIAGNOSIS — M5030 Other cervical disc degeneration, unspecified cervical region: Secondary | ICD-10-CM | POA: Diagnosis not present

## 2020-12-15 DIAGNOSIS — M5136 Other intervertebral disc degeneration, lumbar region: Secondary | ICD-10-CM | POA: Insufficient documentation

## 2020-12-15 DIAGNOSIS — M2578 Osteophyte, vertebrae: Secondary | ICD-10-CM | POA: Diagnosis not present

## 2020-12-15 DIAGNOSIS — M4820 Kissing spine, site unspecified: Secondary | ICD-10-CM | POA: Diagnosis not present

## 2020-12-15 DIAGNOSIS — G8929 Other chronic pain: Secondary | ICD-10-CM | POA: Diagnosis not present

## 2020-12-15 DIAGNOSIS — M5137 Other intervertebral disc degeneration, lumbosacral region: Secondary | ICD-10-CM | POA: Insufficient documentation

## 2020-12-15 DIAGNOSIS — M545 Low back pain, unspecified: Secondary | ICD-10-CM | POA: Diagnosis not present

## 2020-12-15 DIAGNOSIS — S32591S Other specified fracture of right pubis, sequela: Secondary | ICD-10-CM | POA: Insufficient documentation

## 2020-12-15 NOTE — Progress Notes (Signed)
Safety precautions to be maintained throughout the outpatient stay will include: orient to surroundings, keep bed in low position, maintain call bell within reach at all times, provide assistance with transfer out of bed and ambulation.  

## 2020-12-24 DIAGNOSIS — M545 Low back pain, unspecified: Secondary | ICD-10-CM | POA: Diagnosis not present

## 2020-12-27 ENCOUNTER — Other Ambulatory Visit: Payer: Self-pay

## 2020-12-27 ENCOUNTER — Ambulatory Visit
Admission: RE | Admit: 2020-12-27 | Discharge: 2020-12-27 | Disposition: A | Discharge status: Home / Self Care | Payer: Medicare Other | Source: Ambulatory Visit | Attending: Internal Medicine | Admitting: Internal Medicine

## 2020-12-27 DIAGNOSIS — Z1231 Encounter for screening mammogram for malignant neoplasm of breast: Secondary | ICD-10-CM | POA: Diagnosis not present

## 2020-12-29 ENCOUNTER — Other Ambulatory Visit: Payer: Self-pay | Admitting: Internal Medicine

## 2020-12-30 DIAGNOSIS — M4856XD Collapsed vertebra, not elsewhere classified, lumbar region, subsequent encounter for fracture with routine healing: Secondary | ICD-10-CM | POA: Diagnosis not present

## 2021-01-10 DIAGNOSIS — E119 Type 2 diabetes mellitus without complications: Secondary | ICD-10-CM | POA: Diagnosis not present

## 2021-01-10 DIAGNOSIS — D3132 Benign neoplasm of left choroid: Secondary | ICD-10-CM | POA: Diagnosis not present

## 2021-01-10 LAB — HM DIABETES EYE EXAM

## 2021-01-12 DIAGNOSIS — M6284 Sarcopenia: Secondary | ICD-10-CM | POA: Diagnosis not present

## 2021-01-12 DIAGNOSIS — M4856XD Collapsed vertebra, not elsewhere classified, lumbar region, subsequent encounter for fracture with routine healing: Secondary | ICD-10-CM | POA: Diagnosis not present

## 2021-01-12 DIAGNOSIS — M4856XA Collapsed vertebra, not elsewhere classified, lumbar region, initial encounter for fracture: Secondary | ICD-10-CM | POA: Diagnosis not present

## 2021-01-19 ENCOUNTER — Telehealth: Payer: Self-pay | Admitting: Internal Medicine

## 2021-01-19 NOTE — Telephone Encounter (Signed)
Lm on vm to call office to schedule a follow up with Dr Nicki Reaper. Dr Nicki Reaper states patient is overdue to be seen. January is ok to schedule.

## 2021-02-13 ENCOUNTER — Other Ambulatory Visit: Payer: Self-pay | Admitting: Internal Medicine

## 2021-02-21 ENCOUNTER — Other Ambulatory Visit: Payer: Self-pay

## 2021-02-21 ENCOUNTER — Ambulatory Visit (INDEPENDENT_AMBULATORY_CARE_PROVIDER_SITE_OTHER): Payer: Medicare Other | Admitting: Podiatry

## 2021-02-21 ENCOUNTER — Encounter: Payer: Self-pay | Admitting: Podiatry

## 2021-02-21 DIAGNOSIS — M79675 Pain in left toe(s): Secondary | ICD-10-CM

## 2021-02-21 DIAGNOSIS — D689 Coagulation defect, unspecified: Secondary | ICD-10-CM | POA: Diagnosis not present

## 2021-02-21 DIAGNOSIS — B351 Tinea unguium: Secondary | ICD-10-CM

## 2021-02-21 DIAGNOSIS — E0843 Diabetes mellitus due to underlying condition with diabetic autonomic (poly)neuropathy: Secondary | ICD-10-CM

## 2021-02-21 DIAGNOSIS — M79674 Pain in right toe(s): Secondary | ICD-10-CM | POA: Diagnosis not present

## 2021-02-21 NOTE — Progress Notes (Signed)
This patient returns to my office for at risk foot care.  This patient requires this care by a professional since this patient will be at risk due to having type 2 diabetes and coagulation defect   Patient is taking coumadin. This patient is unable to cut nails herself since the patient cannot reach her nails.These nails are painful walking and wearing shoes.  This patient presents for at risk foot care today.  General Appearance  Alert, conversant and in no acute stress.  Vascular  Dorsalis pedis and posterior tibial  pulses are weakly  palpable  bilaterally.  Capillary return is within normal limits  bilaterally. Temperature is within normal limits  bilaterally.  Neurologic  Senn-Weinstein monofilament wire test diminished   bilaterally. Muscle power within normal limits bilaterally.  Nails Thick disfigured discolored nails with subungual debris  from hallux to fifth toes bilaterally. No evidence of bacterial infection or drainage bilaterally.  Orthopedic  No limitations of motion  feet .  No crepitus or effusions noted.  No bony pathology or digital deformities noted.  Skin  normotropic skin with no porokeratosis noted bilaterally.  No signs of infections or ulcers noted.     Onychomycosis  Pain in right toes  Pain in left toes  Consent was obtained for treatment procedures.   Mechanical debridement of nails 1-5  bilaterally performed with a nail nipper.  Filed with dremel without incident.    Return office visit    12  weeks                  Told patient to return for periodic foot care and evaluation due to potential at risk complications.   Alajah Witman DPM  

## 2021-02-25 DIAGNOSIS — M4856XD Collapsed vertebra, not elsewhere classified, lumbar region, subsequent encounter for fracture with routine healing: Secondary | ICD-10-CM | POA: Diagnosis not present

## 2021-03-16 ENCOUNTER — Ambulatory Visit (INDEPENDENT_AMBULATORY_CARE_PROVIDER_SITE_OTHER): Payer: Medicare Other | Admitting: Internal Medicine

## 2021-03-16 ENCOUNTER — Encounter: Payer: Self-pay | Admitting: Internal Medicine

## 2021-03-16 ENCOUNTER — Other Ambulatory Visit: Payer: Self-pay

## 2021-03-16 VITALS — BP 130/70 | HR 90 | Temp 98.6°F | Ht 68.0 in | Wt 250.2 lb

## 2021-03-16 DIAGNOSIS — G8929 Other chronic pain: Secondary | ICD-10-CM | POA: Diagnosis not present

## 2021-03-16 DIAGNOSIS — Z853 Personal history of malignant neoplasm of breast: Secondary | ICD-10-CM

## 2021-03-16 DIAGNOSIS — D649 Anemia, unspecified: Secondary | ICD-10-CM | POA: Diagnosis not present

## 2021-03-16 DIAGNOSIS — M545 Low back pain, unspecified: Secondary | ICD-10-CM | POA: Diagnosis not present

## 2021-03-16 DIAGNOSIS — E7800 Pure hypercholesterolemia, unspecified: Secondary | ICD-10-CM | POA: Diagnosis not present

## 2021-03-16 DIAGNOSIS — K5900 Constipation, unspecified: Secondary | ICD-10-CM

## 2021-03-16 DIAGNOSIS — E1149 Type 2 diabetes mellitus with other diabetic neurological complication: Secondary | ICD-10-CM | POA: Diagnosis not present

## 2021-03-16 DIAGNOSIS — I10 Essential (primary) hypertension: Secondary | ICD-10-CM | POA: Diagnosis not present

## 2021-03-16 DIAGNOSIS — G319 Degenerative disease of nervous system, unspecified: Secondary | ICD-10-CM | POA: Diagnosis not present

## 2021-03-16 LAB — BASIC METABOLIC PANEL
BUN: 20 mg/dL (ref 6–23)
CO2: 30 mEq/L (ref 19–32)
Calcium: 10 mg/dL (ref 8.4–10.5)
Chloride: 101 mEq/L (ref 96–112)
Creatinine, Ser: 0.82 mg/dL (ref 0.40–1.20)
GFR: 66.35 mL/min (ref >60.00)
Glucose, Bld: 101 mg/dL — ABNORMAL HIGH (ref 70–99)
Potassium: 4.7 mEq/L (ref 3.5–5.1)
Sodium: 137 mEq/L (ref 135–145)

## 2021-03-16 LAB — LIPID PANEL
Cholesterol: 148 mg/dL (ref 0–200)
HDL: 60.6 mg/dL (ref >39.00)
LDL Cholesterol: 65 mg/dL (ref 0–99)
NonHDL: 87.21
Total CHOL/HDL Ratio: 2
Triglycerides: 113 mg/dL (ref 0.0–149.0)
VLDL: 22.6 mg/dL (ref 0.0–40.0)

## 2021-03-16 LAB — HEPATIC FUNCTION PANEL
ALT: 12 U/L (ref 0–35)
AST: 16 U/L (ref 0–37)
Albumin: 4.5 g/dL (ref 3.5–5.2)
Alkaline Phosphatase: 46 U/L (ref 39–117)
Bilirubin, Direct: 0.1 mg/dL (ref 0.0–0.3)
Total Bilirubin: 0.5 mg/dL (ref 0.2–1.2)
Total Protein: 6.9 g/dL (ref 6.0–8.3)

## 2021-03-16 LAB — HEMOGLOBIN A1C: Hgb A1c MFr Bld: 5.8 % (ref 4.6–6.5)

## 2021-03-16 LAB — TSH: TSH: 3.53 u[IU]/mL (ref 0.35–5.50)

## 2021-03-16 NOTE — Progress Notes (Signed)
[name], female   DOB: 19-Feb-1941, 81 y.o.   MRN: [id number]   Subjective:    [name], female    DOB: 11/10/1940, 81 y.o.   MRN: [id number]  This visit occurred during the SARS-CoV-2 public health emergency.  Safety protocols were in place, including screening questions prior to the visit, additional usage of staff PPE, and extensive cleaning of exam room while observing appropriate contact time as indicated for disinfecting solutions.   Patient here for a scheduled follow up.   Chief Complaint  Patient presents with   Follow-up    Constipation    .   HPI Here to follow up regarding her blood pressure, blood sugar and cholesterol.  She is accompanied by her caretaker.  History obtained from both of them.  She is doing relatively well.  Recently evaluated by ortho for back pain.  Found to have two compression fractures. One new.  Per note, they discussed kyphoplasty.  Elected to monitor.  Has f/u planned.  Walking with her walker.  No chest pain.  No sob.  No increased cough or congestion.  No acid reflux.  No abdominal pain.  Bowels moving, but reports some constipation.  Has to strain to have a bm.  No blood.  Taking a stool softener and citrucel.     Past Medical History:  Diagnosis Date   Anemia    Arthritis    Atrial fibrillation ([place])    Breast cancer ([place])    s/p lumpectomy 1992.  s/p chemo and xrt left breast   Diabetes mellitus ([place])    Edema    feet/legs   Gastric ulcer    GERD (gastroesophageal reflux disease)    HOH (hard of hearing)    aides   Hypercholesterolemia    Hypertension    Personal history of chemotherapy    Pulmonary emboli [facility])    Past Surgical History:  Procedure Laterality Date   BREAST LUMPECTOMY  03/06/1990   left breast   CATARACT EXTRACTION W/PHACO Right 12/26/2016   Procedure: CATARACT EXTRACTION PHACO AND INTRAOCULAR LENS PLACEMENT (IOC)-RIGHT DIABETIC;  Surgeon: Birder Robson, MD;  Location: [facility] ORS;   Service: Ophthalmology;  Laterality: Right;  [place] 00:47 AP% 24.5 CDE 11.62 Fluid pack lot # [id number]   CATARACT EXTRACTION W/PHACO Left 01/23/2017   Procedure: CATARACT EXTRACTION PHACO AND INTRAOCULAR LENS PLACEMENT (IOC);  Surgeon: Birder Robson, MD;  Location: [facility] ORS;  Service: Ophthalmology;  Laterality: Left;  [place] 00:50 AP% 16.1 CDE 8.18 Fluid pack lot #[id number]   COLONOSCOPY WITH PROPOFOL N/A 07/08/2019   Procedure: COLONOSCOPY WITH PROPOFOL;  Surgeon: Lucilla Lame, MD;  Location: [facility] ENDOSCOPY;  Service: Endoscopy;  Laterality: N/A;   Family History  Problem Relation Age of Onset   Heart disease Father    Heart disease Brother        s/p CABG   Breast cancer Daughter    Colon cancer Neg Hx    Social History   Socioeconomic History   Marital status: Widowed    Spouse name: Not on file   Number of children: Not on file   Years of education: Not on file   Highest education level: Not on file  Occupational History   Not on file  Tobacco Use   Smoking status: Never   Smokeless tobacco: Never  Vaping Use   Vaping Use: Never used  Substance and Sexual Activity   Alcohol use: No    Alcohol/week: 0.0 standard  drinks   Drug use: No   Sexual activity: Never  Other Topics Concern   Not on file  Social History Narrative   Not on file   Social Determinants of Health   Financial Resource Strain: Low Risk    Difficulty of Paying Living Expenses: Not hard at all  Food Insecurity: No Food Insecurity   Worried About Charity fundraiser in the Last Year: Never true   [place] in the Last Year: Never true  Transportation Needs: No Transportation Needs   Lack of Transportation (Medical): No   Lack of Transportation (Non-Medical): No  Physical Activity: Not on file  Stress: No Stress Concern Present   Feeling of Stress : Not at all  Social Connections: Not on file     Review of Systems  Constitutional:  Negative for appetite change and unexpected weight  change.  HENT:  Negative for congestion and sinus pressure.   Respiratory:  Negative for cough, chest tightness and shortness of breath.   Cardiovascular:  Negative for chest pain, palpitations and leg swelling.  Gastrointestinal:  Negative for abdominal pain, diarrhea, nausea and vomiting.  Genitourinary:  Negative for difficulty urinating and dysuria.  Musculoskeletal:  Positive for back pain. Negative for joint swelling and myalgias.  Skin:  Negative for color change and rash.  Neurological:  Negative for dizziness, light-headedness and headaches.  Psychiatric/Behavioral:  Negative for agitation and dysphoric mood.       Objective:     BP 130/70 (BP Location: Right Arm, Patient Position: Sitting, Cuff Size: Large)    Pulse 90    Temp 98.6 F (37 C) (Oral)    Ht _0  (1.727 m)    Wt 250 lb 3.2 oz (113.5 kg)    SpO2 97%    BMI 38.04 kg/m  Wt Readings from Last 3 Encounters:  03/16/21 250 lb 3.2 oz (113.5 kg)  12/15/20 242 lb (109.8 kg)  11/17/20 242 lb (109.8 kg)    Physical Exam Vitals reviewed.  Constitutional:      General: She is not in acute distress.    Appearance: Normal appearance.  HENT:     Head: Normocephalic and atraumatic.     Right Ear: External ear normal.     Left Ear: External ear normal.  Eyes:     General: No scleral icterus.       Right eye: No discharge.        Left eye: No discharge.     Conjunctiva/sclera: Conjunctivae normal.  Neck:     Thyroid: No thyromegaly.  Cardiovascular:     Rate and Rhythm: Normal rate and regular rhythm.  Pulmonary:     Effort: No respiratory distress.     Breath sounds: Normal breath sounds. No wheezing.  Abdominal:     General: Bowel sounds are normal.     Palpations: Abdomen is soft.     Tenderness: There is no abdominal tenderness.  Musculoskeletal:        General: No tenderness.     Cervical back: Neck supple. No tenderness.     Comments: No increased swelling - lower extremities.  Stable.   Lymphadenopathy:      Cervical: No cervical adenopathy.  Skin:    Findings: No erythema or rash.  Neurological:     Mental Status: She is alert.  Psychiatric:        Mood and Affect: Mood normal.        Behavior: Behavior normal.  Outpatient Encounter Medications as of 03/16/2021  Medication Sig   acetaminophen (TYLENOL) 325 MG tablet Take 2 tablets (650 mg total) by mouth every 6 (six) hours as needed for moderate pain (headache).   amLODipine (NORVASC) 2.5 MG tablet TAKE 1 TABLET(2.5 MG) BY MOUTH DAILY   diphenhydrAMINE (BENADRYL) 25 mg capsule Take 25 mg at bedtime as needed by mouth for sleep.    feeding supplement (ENSURE ENLIVE / ENSURE PLUS) LIQD Take 237 mLs by mouth 2 (two) times daily between meals.   FEROSUL 325 (65 Fe) MG tablet TAKE 1 TABLET(325 MG) BY MOUTH DAILY   glucose blood (CONTOUR TEST) test strip USE TO TEST BLOOD SUGAR ONCE EVERY WEEK   losartan (COZAAR) 100 MG tablet TAKE 1 TABLET(100 MG) BY MOUTH DAILY   Multiple Vitamin (MULTIVITAMIN WITH MINERALS) TABS tablet Take 1 tablet daily by mouth.   pantoprazole (PROTONIX) 40 MG tablet TAKE 1 TABLET(40 MG) BY MOUTH DAILY   simvastatin (ZOCOR) 10 MG tablet TAKE 1 TABLET BY MOUTH EVERY DAY   tiZANidine (ZANAFLEX) 2 MG tablet Take 4 mg by mouth 2 (two) times daily.   [DISCONTINUED] traMADol (ULTRAM) 50 MG tablet Take 1 tablet (50 mg total) by mouth every 8 (eight) hours as needed. (Patient not taking: Reported on 12/15/2020)   No facility-administered encounter medications on file as of 03/16/2021.     Lab Results  Component Value Date   WBC 6.3 07/22/2020   HGB 12.5 07/22/2020   HCT 37.4 07/22/2020   PLT 220.0 07/22/2020   GLUCOSE 101 (H) 03/16/2021   CHOL 148 03/16/2021   TRIG 113.0 03/16/2021   HDL 60.60 03/16/2021   LDLCALC 65 03/16/2021   ALT 12 03/16/2021   AST 16 03/16/2021   NA 137 03/16/2021   K 4.7 03/16/2021   CL 101 03/16/2021   CREATININE 0.82 03/16/2021   BUN 20 03/16/2021   CO2 30 03/16/2021   TSH 3.53  03/16/2021   INR 1.2 (H) 07/22/2020   HGBA1C 5.8 03/16/2021   MICROALBUR <0.7 11/14/2017    MM 3D SCREEN BREAST BILATERAL  Result Date: 12/29/2020 CLINICAL DATA:  Screening. EXAM: DIGITAL SCREENING BILATERAL MAMMOGRAM WITH TOMOSYNTHESIS AND CAD TECHNIQUE: Bilateral screening digital craniocaudal and mediolateral oblique mammograms were obtained. Bilateral screening digital breast tomosynthesis was performed. The images were evaluated with computer-aided detection. COMPARISON:  Previous exam(s). ACR Breast Density Category b: There are scattered areas of fibroglandular density. FINDINGS: There are no findings suspicious for malignancy. IMPRESSION: No mammographic evidence of malignancy. A result letter of this screening mammogram will be mailed directly to the patient. RECOMMENDATION: Screening mammogram in one year. (Code:SM-B-01Y) BI-RADS CATEGORY  1: Negative. Electronically Signed   By: Dorise Bullion III M.D.   On: 12/29/2020 16:50      Assessment & Plan:   Problem List Items Addressed This Visit     Acute exacerbation of chronic low back pain    Saw ortho.  Compression fracture.  Discussed kyphoplasty.  Elected to monitor.  Taking tylenol.  Follow. Has f/u scheduled with ortho.       Relevant Medications   tiZANidine (ZANAFLEX) 2 MG tablet   Anemia    hgb 10/2020 - wnl.  Colonoscopy 2021.       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  Mild.  Stable.       Constipation    Discussed.  Having bowel movements.  Has to strain more.  Taking citrucel tablets and stool softener.  Trial  of miralax.  Stay hydrated.  Follow.        History of breast cancer    Mammogram 12/29/20 - birads I.       Hypercholesteremia - Primary    On simvastatin.  Low cholesterol diet and exercise.  Follow lipid panel and liver function tests.        Relevant Orders   Hepatic function panel (Completed)   Lipid panel (Completed)   TSH (Completed)   Hypertension    Continue losartan  and amlodipine.  Follow pressures.       Type 2 diabetes mellitus with neurological complications (HCC)    Low carb diet and exercise as tolerated.  Follow met b and a1c.       Relevant Orders   Hemoglobin A1c (Completed)   Basic metabolic panel (Completed)     Einar Pheasant, MD

## 2021-03-17 ENCOUNTER — Encounter: Payer: Self-pay | Admitting: Internal Medicine

## 2021-03-17 NOTE — Assessment & Plan Note (Addendum)
hgb 10/2020 - wnl.  Colonoscopy 2021.

## 2021-03-17 NOTE — Assessment & Plan Note (Signed)
Found on previous CT head.  Mild.  Stable.  

## 2021-03-17 NOTE — Assessment & Plan Note (Signed)
Saw ortho.  Compression fracture.  Discussed kyphoplasty.  Elected to monitor.  Taking tylenol.  Follow. Has f/u scheduled with ortho.

## 2021-03-17 NOTE — Assessment & Plan Note (Signed)
Mammogram 12/29/20 - birads I.  

## 2021-03-17 NOTE — Assessment & Plan Note (Signed)
Continue losartan and amlodipine.  Follow pressures.  

## 2021-03-17 NOTE — Assessment & Plan Note (Signed)
Low carb diet and exercise as tolerated.  Follow met b and a1c.   

## 2021-03-17 NOTE — Assessment & Plan Note (Signed)
Discussed.  Having bowel movements.  Has to strain more.  Taking citrucel tablets and stool softener.  Trial of miralax.  Stay hydrated.  Follow.

## 2021-03-17 NOTE — Assessment & Plan Note (Signed)
On simvastatin.  Low cholesterol diet and exercise.  Follow lipid panel and liver function tests.   

## 2021-04-01 DIAGNOSIS — Z859 Personal history of malignant neoplasm, unspecified: Secondary | ICD-10-CM | POA: Diagnosis not present

## 2021-04-01 DIAGNOSIS — Z86018 Personal history of other benign neoplasm: Secondary | ICD-10-CM | POA: Diagnosis not present

## 2021-04-01 DIAGNOSIS — I872 Venous insufficiency (chronic) (peripheral): Secondary | ICD-10-CM | POA: Diagnosis not present

## 2021-04-01 DIAGNOSIS — Z872 Personal history of diseases of the skin and subcutaneous tissue: Secondary | ICD-10-CM | POA: Diagnosis not present

## 2021-04-01 DIAGNOSIS — D225 Melanocytic nevi of trunk: Secondary | ICD-10-CM | POA: Diagnosis not present

## 2021-04-01 DIAGNOSIS — L249 Irritant contact dermatitis, unspecified cause: Secondary | ICD-10-CM | POA: Diagnosis not present

## 2021-04-01 DIAGNOSIS — L578 Other skin changes due to chronic exposure to nonionizing radiation: Secondary | ICD-10-CM | POA: Diagnosis not present

## 2021-04-01 DIAGNOSIS — Z85828 Personal history of other malignant neoplasm of skin: Secondary | ICD-10-CM | POA: Diagnosis not present

## 2021-04-01 DIAGNOSIS — L821 Other seborrheic keratosis: Secondary | ICD-10-CM | POA: Diagnosis not present

## 2021-04-05 ENCOUNTER — Ambulatory Visit: Payer: Medicare Other

## 2021-04-08 DIAGNOSIS — M545 Low back pain, unspecified: Secondary | ICD-10-CM | POA: Diagnosis not present

## 2021-04-26 ENCOUNTER — Telehealth: Payer: Self-pay

## 2021-04-26 ENCOUNTER — Ambulatory Visit: Payer: Medicare Other

## 2021-04-26 NOTE — Telephone Encounter (Signed)
Unable to reach patient on preferred number listed in appointment notes. No voicemail. Reschedule AWV as appropriate.

## 2021-04-27 ENCOUNTER — Ambulatory Visit (INDEPENDENT_AMBULATORY_CARE_PROVIDER_SITE_OTHER): Payer: Medicare Other

## 2021-04-27 VITALS — Ht 68.0 in | Wt 250.0 lb

## 2021-04-27 DIAGNOSIS — Z0000 Encounter for general adult medical examination without abnormal findings: Secondary | ICD-10-CM | POA: Diagnosis not present

## 2021-04-27 NOTE — Progress Notes (Signed)
Subjective:   Anita Walker is a 81 y.o. female who presents for Medicare Annual (Subsequent) preventive examination.  Review of Systems    No ROS.  Medicare Wellness Virtual Visit.  Visual/audio telehealth visit, UTA vital signs.   See social history for additional risk factors.   Cardiac Risk Factors include: advanced age (>1men, >2 women);diabetes mellitus;hypertension     Objective:    Today's Vitals   04/27/21 0949  Weight: 250 lb (113.4 kg)  Height: 5\' 8"  (1.727 m)   Body mass index is 38.01 kg/m.  Advanced Directives 04/27/2021 04/15/2020 04/02/2020 01/01/2020 11/18/2019 10/21/2019 09/18/2019  Does Patient Have a Medical Advance Directive? Yes No Yes Yes Yes No Yes  Type of Paramedic of [place];Living will - [place];Living will - - - [place];Living will  Does patient want to make changes to medical advance directive? No - Patient declined No - Patient declined No - Patient declined - - - -  Copy of [place] in Chart? Yes - validated most recent copy scanned in chart (See row information) - Yes - validated most recent copy scanned in chart (See row information) - - - No - copy requested  Would patient like information on creating a medical advance directive? - No - Patient declined - - - No - Patient declined -    Current Medications (verified) Outpatient Encounter Medications as of 04/27/2021  Medication Sig   acetaminophen (TYLENOL) 325 MG tablet Take 2 tablets (650 mg total) by mouth every 6 (six) hours as needed for moderate pain (headache).   amLODipine (NORVASC) 2.5 MG tablet TAKE 1 TABLET(2.5 MG) BY MOUTH DAILY   diphenhydrAMINE (BENADRYL) 25 mg capsule Take 25 mg at bedtime as needed by mouth for sleep.    feeding supplement (ENSURE ENLIVE / ENSURE PLUS) LIQD Take 237 mLs by mouth 2 (two) times daily between meals.   FEROSUL 325 (65 Fe) MG tablet TAKE 1 TABLET(325 MG) BY MOUTH  DAILY   glucose blood (CONTOUR TEST) test strip USE TO TEST BLOOD SUGAR ONCE EVERY WEEK   losartan (COZAAR) 100 MG tablet TAKE 1 TABLET(100 MG) BY MOUTH DAILY   Multiple Vitamin (MULTIVITAMIN WITH MINERALS) TABS tablet Take 1 tablet daily by mouth.   pantoprazole (PROTONIX) 40 MG tablet TAKE 1 TABLET(40 MG) BY MOUTH DAILY   simvastatin (ZOCOR) 10 MG tablet TAKE 1 TABLET BY MOUTH EVERY DAY   tiZANidine (ZANAFLEX) 2 MG tablet Take 4 mg by mouth 2 (two) times daily.   No facility-administered encounter medications on file as of 04/27/2021.    Allergies (verified) Penicillins and Penicillin v potassium   History: Past Medical History:  Diagnosis Date   Anemia    Arthritis    Atrial fibrillation ([place])    Breast cancer ([place])    s/p lumpectomy 1992.  s/p chemo and xrt left breast   Diabetes mellitus ([place])    Edema    feet/legs   Gastric ulcer    GERD (gastroesophageal reflux disease)    HOH (hard of hearing)    aides   Hypercholesterolemia    Hypertension    Personal history of chemotherapy    Pulmonary emboli [facility])    Past Surgical History:  Procedure Laterality Date   BREAST LUMPECTOMY  03/06/1990   left breast   CATARACT EXTRACTION W/PHACO Right 12/26/2016   Procedure: CATARACT EXTRACTION PHACO AND INTRAOCULAR LENS PLACEMENT (IOC)-RIGHT DIABETIC;  Surgeon: Birder Robson, MD;  Location:  [facility] ORS;  Service: Ophthalmology;  Laterality: Right;  [place] 00:47 AP% 24.5 CDE 11.62 Fluid pack lot # [id number]   CATARACT EXTRACTION W/PHACO Left 01/23/2017   Procedure: CATARACT EXTRACTION PHACO AND INTRAOCULAR LENS PLACEMENT (IOC);  Surgeon: Birder Robson, MD;  Location: [facility] ORS;  Service: Ophthalmology;  Laterality: Left;  [place] 00:50 AP% 16.1 CDE 8.18 Fluid pack lot #[id number] H   COLONOSCOPY WITH PROPOFOL N/A 07/08/2019   Procedure: COLONOSCOPY WITH PROPOFOL;  Surgeon: Lucilla Lame, MD;  Location: [facility] ENDOSCOPY;  Service: Endoscopy;  Laterality: N/A;   Family History  Problem Relation  Age of Onset   Heart disease Father    Heart disease Brother        s/p CABG   Breast cancer Daughter    Colon cancer Neg Hx    Social History   Socioeconomic History   Marital status: Widowed    Spouse name: Not on file   Number of children: Not on file   Years of education: Not on file   Highest education level: Not on file  Occupational History   Not on file  Tobacco Use   Smoking status: Never   Smokeless tobacco: Never  Vaping Use   Vaping Use: Never used  Substance and Sexual Activity   Alcohol use: No    Alcohol/week: 0.0 standard drinks   Drug use: No   Sexual activity: Never  Other Topics Concern   Not on file  Social History Narrative   Not on file   Social Determinants of Health   Financial Resource Strain: Low Risk    Difficulty of Paying Living Expenses: Not hard at all  Food Insecurity: No Food Insecurity   Worried About Charity fundraiser in the Last Year: Never true   [place] in the Last Year: Never true  Transportation Needs: No Transportation Needs   Lack of Transportation (Medical): No   Lack of Transportation (Non-Medical): No  Physical Activity: Insufficiently Active   Days of Exercise per Week: 7 days   Minutes of Exercise per Session: 20 min  Stress: No Stress Concern Present   Feeling of Stress : Not at all  Social Connections: Unknown   Frequency of Communication with Friends and Family: More than three times a week   Frequency of Social Gatherings with Friends and Family: More than three times a week   Attends Religious Services: Not on Electrical engineer or Organizations: Not on file   Attends Archivist Meetings: Not on file   Marital Status: Not on file    Tobacco Counseling Counseling given: Not Answered   Clinical Intake:  Pre-visit preparation completed: Yes        Diabetes: Yes (Followed by PCP)  How often do you need to have someone help you when you read instructions, pamphlets, or  other written materials from your doctor or pharmacy?: 1 - Never  Nutrition Risk Assessment: Does the patient have any non-healing wounds?  No  Has the patient had any unintentional weight loss or weight gain?  No   Diabetes: How often do you monitor your CBG's? Daily. FBS BS 114.   Financial Strains and Diabetes Management: Are you having any financial strains with the device, your supplies or your medication? No .  Does the patient want to be seen by Chronic Care Management for management of their diabetes?  No Would the patient like to be referred to a Nutritionist or for Diabetic Management?  No  Activities of Daily Living In your present state of health, do you have any difficulty performing the following activities: 04/27/2021  Hearing? Y  Comment Hearing aids  Vision? N  Difficulty concentrating or making decisions? N  Comment Age appropriate  Walking or climbing stairs? Y  Comment Walker in use when ambulating  Dressing or bathing? N  Doing errands, shopping? Y  Comment Does not drive or run errands alone  Preparing Food and eating ? N  Comment Daughter assist with meal prep. Self feeds.  Using the Toilet? N  In the past six months, have you accidently leaked urine? Y  Comment Managed with daily brief  Do you have problems with loss of bowel control? N  Managing your Medications? N  Managing your Finances? Y  Comment Daughter assist  Housekeeping or managing your Housekeeping? Y  Comment Daughter assist  Some recent data might be hidden    Patient Care Team: Einar Pheasant, MD as PCP - General (Internal Medicine)  Indicate any recent Medical Services you may have received from other than [facility] providers in the past year (date may be approximate).     Assessment:   This is a routine wellness examination for Briggett Tuccillo.   Virtual Visit via Telephone Note  I connected with  Anita Walker on 04/27/21 at  9:45 AM EST by telephone and verified that I am  speaking with the correct person using two identifiers.  Persons participating in the virtual visit: patient/Nurse Health Advisor   I discussed the limitations, risks, security and privacy concerns of performing an evaluation and management service by telephone and the availability of in person appointments. The patient expressed understanding and agreed to proceed.  Interactive audio and video telecommunications were attempted between this nurse and patient, however failed, due to patient having technical difficulties OR patient did not have access to video capability.  We continued and completed visit with audio only.  Some vital signs may be absent or patient reported.   Hearing/Vision screen Hearing Screening - Comments:: Followed by [facility]  Visits every 3 months  Hearing aid, bilateral Vision Screening - Comments:: Followed by [facility] Wears corrective lenses  Cataract extraction, bilateral    Dietary issues and exercise activities discussed: Current Exercise Habits: Home exercise routine, Type of exercise: walking, Intensity: Mild   Goals Addressed             This Visit's Progress    Follow up with Primary Care Provider       As needed.       Depression Screen PHQ 2/9 Scores 04/27/2021 03/16/2021 12/15/2020 04/02/2020 12/11/2019 09/18/2019 08/14/2019  PHQ - 2 Score 0 0 0 0 0 0 0  PHQ- 9 Score - - - - - - -    Fall Risk Fall Risk  04/27/2021 03/16/2021 12/15/2020 04/02/2020 01/27/2020  Falls in the past year? 0 0 1 - 0  Number falls in past yr: 0 0 1 - -  Injury with Fall? - 0 1 - -  Risk for fall due to : - No Fall Risks - Impaired balance/gait -  Follow up Falls evaluation completed Falls evaluation completed - - -    FALL RISK PREVENTION PERTAINING TO THE HOME: Home free of loose throw rugs in walkways, pet beds, electrical cords, etc? Yes  Adequate lighting in your home to reduce risk of falls? Yes   ASSISTIVE DEVICES UTILIZED TO PREVENT  FALLS:  Use of a cane, walker or  w/c? Yes   TIMED UP AND GO: Was the test performed? No .   Cognitive Function: Patient is alert and oriented x3.  MMSE - Mini Mental State Exam 02/11/2016  Not completed: Refused     6CIT Screen 03/21/2019 03/13/2018 03/02/2017 02/11/2016  What Year? 0 points 0 points 0 points 0 points  What month? 0 points 0 points 0 points 0 points  What time? 0 points 0 points 0 points 0 points  Count back from 20 0 points 0 points 0 points (No Data)  Months in reverse 0 points 4 points 0 points (No Data)  Repeat phrase 2 points - 0 points 2 points  Total Score 2 - 0 -    Immunizations Immunization History  Administered Date(s) Administered   Fluad Quad(high Dose 65+) 12/11/2018, 12/02/2019, 11/04/2020   Influenza Split 11/19/2011, 12/09/2012, 12/27/2017   Influenza, High Dose Seasonal PF 12/02/2015, 11/07/2016, 11/14/2017   Influenza,inj,Quad PF,6+ Mos 12/03/2013, 11/12/2014   Influenza-Unspecified 12/12/2011, 12/11/2012, 12/03/2013, 11/12/2014, 12/02/2015   PFIZER(Purple Top)SARS-COV-2 Vaccination 03/20/2019, 04/10/2019, 01/05/2020   Pneumococcal Conjugate-13 01/14/2014   Pneumococcal Polysaccharide-23 02/11/2016   Zoster Recombinat (Shingrix) 09/15/2017, 11/15/2017, 04/03/2018   TDAP status: Due, Education has been provided regarding the importance of this vaccine. Advised may receive this vaccine at local pharmacy or Health Dept. Aware to provide a copy of the vaccination record if obtained from local pharmacy or Health Dept. Verbalized acceptance and understanding. Deferred.   Screening Tests Health Maintenance  Topic Date Due   FOOT EXAM  04/15/2021   COVID-19 Vaccine (4 - Booster for Pfizer series) 05/13/2021 (Originally 03/01/2020)   TETANUS/TDAP  04/27/2022 (Originally 03/21/1959)   HEMOGLOBIN A1C  09/13/2021   MAMMOGRAM  12/27/2021   OPHTHALMOLOGY EXAM  01/10/2022   Pneumonia Vaccine 4+ Years old  Completed   INFLUENZA VACCINE  Completed    DEXA SCAN  Completed   Zoster Vaccines- Shingrix  Completed   HPV VACCINES  Aged Out   Health Maintenance Health Maintenance Due  Topic Date Due   FOOT EXAM  04/15/2021   Lung Cancer Screening: (Low Dose CT Chest recommended if Age 93-80 years, 30 pack-year currently smoking OR have quit w/in 15years.) does not qualify.   Hepatitis C Screening: does not qualify  Vision Screening: Recommended annual ophthalmology exams for early detection of glaucoma and other disorders of the eye.  Dental Screening: Recommended annual dental exams for proper oral hygiene. Denture.  Community Resource Referral / Chronic Care Management: CRR required this visit?  No   CCM required this visit?  No      Plan:   Keep all routine maintenance appointments.   I have personally reviewed and noted the following in the patients chart:   Medical and social history Use of alcohol, tobacco or illicit drugs  Current medications and supplements including opioid prescriptions.  Functional ability and status Nutritional status Physical activity Advanced directives List of other physicians Hospitalizations, surgeries, and ER visits in previous 12 months Vitals Screenings to include cognitive, depression, and falls Referrals and appointments  In addition, I have reviewed and discussed with patient certain preventive protocols, quality metrics, and best practice recommendations. A written personalized care plan for preventive services as well as general preventive health recommendations were provided to patient via mychart.     Varney Biles, LPN   [id number]

## 2021-04-27 NOTE — Patient Instructions (Addendum)
Anita Walker , Thank you for taking time to come for your Medicare Wellness Visit. I appreciate your ongoing commitment to your health goals. Please review the following plan we discussed and let me know if I can assist you in the future.   These are the goals we discussed:  Goals      Follow up with Primary Care Provider     As needed.        This is a list of the screening recommended for you and due dates:  Health Maintenance  Topic Date Due   Complete foot exam   04/15/2021   COVID-19 Vaccine (4 - Booster for Pfizer series) 05/13/2021*   Tetanus Vaccine  04/27/2022*   Hemoglobin A1C  09/13/2021   Mammogram  12/27/2021   Eye exam for diabetics  01/10/2022   Pneumonia Vaccine  Completed   Flu Shot  Completed   DEXA scan (bone density measurement)  Completed   Zoster (Shingles) Vaccine  Completed   HPV Vaccine  Aged Out  *Topic was postponed. The date shown is not the original due date.    Advanced directives: on file  Conditions/risks identified:  none new  Follow up in one year for your annual wellness visit    Preventive Care 65 Years and Older, Female Preventive care refers to lifestyle choices and visits with your health care provider that can promote health and wellness. What does preventive care include? A yearly physical exam. This is also called an annual well check. Dental exams once or twice a year. Routine eye exams. Ask your health care provider how often you should have your eyes checked. Personal lifestyle choices, including: Daily care of your teeth and gums. Regular physical activity. Eating a healthy diet. Avoiding tobacco and drug use. Limiting alcohol use. Practicing safe sex. Taking low-dose aspirin every day. Taking vitamin and mineral supplements as recommended by your health care provider. What happens during an annual well check? The services and screenings done by your health care provider during your annual well check will depend on your  age, overall health, lifestyle risk factors, and family history of disease. Counseling  Your health care provider may ask you questions about your: Alcohol use. Tobacco use. Drug use. Emotional well-being. Home and relationship well-being. Sexual activity. Eating habits. History of falls. Memory and ability to understand (cognition). Work and work Statistician. Reproductive health. Screening  You may have the following tests or measurements: Height, weight, and BMI. Blood pressure. Lipid and cholesterol levels. These may be checked every 5 years, or more frequently if you are over 33 years old. Skin check. Lung cancer screening. You may have this screening every year starting at age 57 if you have a 30-pack-year history of smoking and currently smoke or have quit within the past 15 years. Fecal occult blood test (FOBT) of the stool. You may have this test every year starting at age 8. Flexible sigmoidoscopy or colonoscopy. You may have a sigmoidoscopy every 5 years or a colonoscopy every 10 years starting at age 67. Hepatitis C blood test. Hepatitis B blood test. Sexually transmitted disease (STD) testing. Diabetes screening. This is done by checking your blood sugar (glucose) after you have not eaten for a while (fasting). You may have this done every 1-3 years. Bone density scan. This is done to screen for osteoporosis. You may have this done starting at age 39. Mammogram. This may be done every 1-2 years. Talk to your health care provider about how often you  should have regular mammograms. Talk with your health care provider about your test results, treatment options, and if necessary, the need for more tests. Vaccines  Your health care provider may recommend certain vaccines, such as: Influenza vaccine. This is recommended every year. Tetanus, diphtheria, and acellular pertussis (Tdap, Td) vaccine. You may need a Td booster every 10 years. Zoster vaccine. You may need this after  age 43. Pneumococcal 13-valent conjugate (PCV13) vaccine. One dose is recommended after age 15. Pneumococcal polysaccharide (PPSV23) vaccine. One dose is recommended after age 47. Talk to your health care provider about which screenings and vaccines you need and how often you need them. This information is not intended to replace advice given to you by your health care provider. Make sure you discuss any questions you have with your health care provider. Document Released: 03/19/2015 Document Revised: 11/10/2015 Document Reviewed: 12/22/2014 Elsevier Interactive Patient Education  2017 [place] Prevention in the Home Falls can cause injuries. They can happen to people of all ages. There are many things you can do to make your home safe and to help prevent falls. What can I do on the outside of my home? Regularly fix the edges of walkways and driveways and fix any cracks. Remove anything that might make you trip as you walk through a door, such as a raised step or threshold. Trim any bushes or trees on the path to your home. Use bright outdoor lighting. Clear any walking paths of anything that might make someone trip, such as rocks or tools. Regularly check to see if handrails are loose or broken. Make sure that both sides of any steps have handrails. Any raised decks and porches should have guardrails on the edges. Have any leaves, snow, or ice cleared regularly. Use sand or salt on walking paths during winter. Clean up any spills in your garage right away. This includes oil or grease spills. What can I do in the bathroom? Use night lights. Install grab bars by the toilet and in the tub and shower. Do not use towel bars as grab bars. Use non-skid mats or decals in the tub or shower. If you need to sit down in the shower, use a plastic, non-slip stool. Keep the floor dry. Clean up any water that spills on the floor as soon as it happens. Remove soap buildup in the tub or shower  regularly. Attach bath mats securely with double-sided non-slip rug tape. Do not have throw rugs and other things on the floor that can make you trip. What can I do in the bedroom? Use night lights. Make sure that you have a light by your bed that is easy to reach. Do not use any sheets or blankets that are too big for your bed. They should not hang down onto the floor. Have a firm chair that has side arms. You can use this for support while you get dressed. Do not have throw rugs and other things on the floor that can make you trip. What can I do in the kitchen? Clean up any spills right away. Avoid walking on wet floors. Keep items that you use a lot in easy-to-reach places. If you need to reach something above you, use a strong step stool that has a grab bar. Keep electrical cords out of the way. Do not use floor polish or wax that makes floors slippery. If you must use wax, use non-skid floor wax. Do not have throw rugs and other things on the  floor that can make you trip. What can I do with my stairs? Do not leave any items on the stairs. Make sure that there are handrails on both sides of the stairs and use them. Fix handrails that are broken or loose. Make sure that handrails are as long as the stairways. Check any carpeting to make sure that it is firmly attached to the stairs. Fix any carpet that is loose or worn. Avoid having throw rugs at the top or bottom of the stairs. If you do have throw rugs, attach them to the floor with carpet tape. Make sure that you have a light switch at the top of the stairs and the bottom of the stairs. If you do not have them, ask someone to add them for you. What else can I do to help prevent falls? Wear shoes that: Do not have high heels. Have rubber bottoms. Are comfortable and fit you well. Are closed at the toe. Do not wear sandals. If you use a stepladder: Make sure that it is fully opened. Do not climb a closed stepladder. Make sure that  both sides of the stepladder are locked into place. Ask someone to hold it for you, if possible. Clearly mark and make sure that you can see: Any grab bars or handrails. First and last steps. Where the edge of each step is. Use tools that help you move around (mobility aids) if they are needed. These include: Canes. Walkers. Scooters. Crutches. Turn on the lights when you go into a dark area. Replace any light bulbs as soon as they burn out. Set up your furniture so you have a clear path. Avoid moving your furniture around. If any of your floors are uneven, fix them. If there are any pets around you, be aware of where they are. Review your medicines with your doctor. Some medicines can make you feel dizzy. This can increase your chance of falling. Ask your doctor what other things that you can do to help prevent falls. This information is not intended to replace advice given to you by your health care provider. Make sure you discuss any questions you have with your health care provider. Document Released: 12/17/2008 Document Revised: 07/29/2015 Document Reviewed: 03/27/2014 Elsevier Interactive Patient Education  2017 [employer].

## 2021-05-14 ENCOUNTER — Other Ambulatory Visit: Payer: Self-pay | Admitting: Internal Medicine

## 2021-05-30 ENCOUNTER — Other Ambulatory Visit: Payer: Self-pay

## 2021-05-30 ENCOUNTER — Ambulatory Visit (INDEPENDENT_AMBULATORY_CARE_PROVIDER_SITE_OTHER): Payer: Medicare Other | Admitting: Podiatry

## 2021-05-30 ENCOUNTER — Encounter: Payer: Self-pay | Admitting: Podiatry

## 2021-05-30 DIAGNOSIS — M79674 Pain in right toe(s): Secondary | ICD-10-CM

## 2021-05-30 DIAGNOSIS — E1142 Type 2 diabetes mellitus with diabetic polyneuropathy: Secondary | ICD-10-CM | POA: Diagnosis not present

## 2021-05-30 DIAGNOSIS — M79675 Pain in left toe(s): Secondary | ICD-10-CM | POA: Diagnosis not present

## 2021-05-30 DIAGNOSIS — D689 Coagulation defect, unspecified: Secondary | ICD-10-CM | POA: Diagnosis not present

## 2021-05-30 DIAGNOSIS — B351 Tinea unguium: Secondary | ICD-10-CM | POA: Diagnosis not present

## 2021-05-30 DIAGNOSIS — E0843 Diabetes mellitus due to underlying condition with diabetic autonomic (poly)neuropathy: Secondary | ICD-10-CM

## 2021-05-30 NOTE — Progress Notes (Signed)
This patient returns to my office for at risk foot care.  This patient requires this care by a professional since this patient will be at risk due to having type 2 diabetes and coagulation defect   Patient is taking coumadin. This patient is unable to cut nails herself since the patient cannot reach her nails.These nails are painful walking and wearing shoes.  This patient presents for at risk foot care today.  General Appearance  Alert, conversant and in no acute stress.  Vascular  Dorsalis pedis and posterior tibial  pulses are weakly  palpable  bilaterally.  Capillary return is within normal limits  bilaterally. Temperature is within normal limits  bilaterally.  Neurologic  Senn-Weinstein monofilament wire test diminished   bilaterally. Muscle power within normal limits bilaterally.  Nails Thick disfigured discolored nails with subungual debris  from hallux to fifth toes bilaterally. No evidence of bacterial infection or drainage bilaterally.  Orthopedic  No limitations of motion  feet .  No crepitus or effusions noted.  No bony pathology or digital deformities noted.  Skin  normotropic skin with no porokeratosis noted bilaterally.  No signs of infections or ulcers noted.     Onychomycosis  Pain in right toes  Pain in left toes  Consent was obtained for treatment procedures.   Mechanical debridement of nails 1-5  bilaterally performed with a nail nipper.  Filed with dremel without incident.    Return office visit    12  weeks                  Told patient to return for periodic foot care and evaluation due to potential at risk complications.   Ajani Rineer DPM  

## 2021-06-05 ENCOUNTER — Encounter: Payer: Self-pay | Admitting: Internal Medicine

## 2021-06-06 ENCOUNTER — Other Ambulatory Visit: Payer: Self-pay

## 2021-06-06 MED ORDER — CONTOUR TEST VI STRP: ORAL_STRIP | 1 refills | Status: DC

## 2021-06-22 ENCOUNTER — Encounter: Payer: Self-pay | Admitting: Internal Medicine

## 2021-06-22 ENCOUNTER — Ambulatory Visit (INDEPENDENT_AMBULATORY_CARE_PROVIDER_SITE_OTHER): Payer: Medicare Other | Admitting: Internal Medicine

## 2021-06-22 VITALS — BP 136/80 | HR 85 | Temp 97.6°F | Resp 16 | Ht 68.0 in | Wt 249.6 lb

## 2021-06-22 DIAGNOSIS — Z1231 Encounter for screening mammogram for malignant neoplasm of breast: Secondary | ICD-10-CM | POA: Diagnosis not present

## 2021-06-22 DIAGNOSIS — E1149 Type 2 diabetes mellitus with other diabetic neurological complication: Secondary | ICD-10-CM

## 2021-06-22 DIAGNOSIS — E7800 Pure hypercholesterolemia, unspecified: Secondary | ICD-10-CM | POA: Diagnosis not present

## 2021-06-22 DIAGNOSIS — E1142 Type 2 diabetes mellitus with diabetic polyneuropathy: Secondary | ICD-10-CM | POA: Diagnosis not present

## 2021-06-22 DIAGNOSIS — G319 Degenerative disease of nervous system, unspecified: Secondary | ICD-10-CM | POA: Diagnosis not present

## 2021-06-22 DIAGNOSIS — D649 Anemia, unspecified: Secondary | ICD-10-CM | POA: Diagnosis not present

## 2021-06-22 DIAGNOSIS — I10 Essential (primary) hypertension: Secondary | ICD-10-CM

## 2021-06-22 DIAGNOSIS — Z853 Personal history of malignant neoplasm of breast: Secondary | ICD-10-CM

## 2021-06-22 LAB — BASIC METABOLIC PANEL
BUN: 22 mg/dL (ref 6–23)
CO2: 30 mEq/L (ref 19–32)
Calcium: 9.8 mg/dL (ref 8.4–10.5)
Chloride: 102 mEq/L (ref 96–112)
Creatinine, Ser: 0.86 mg/dL (ref 0.40–1.20)
GFR: 62.55 mL/min (ref >60.00)
Glucose, Bld: 105 mg/dL — ABNORMAL HIGH (ref 70–99)
Potassium: 4.7 mEq/L (ref 3.5–5.1)
Sodium: 137 mEq/L (ref 135–145)

## 2021-06-22 LAB — IBC + FERRITIN
Ferritin: 104.3 ng/mL (ref 10.0–291.0)
Iron: 68 ug/dL (ref 42–145)
Saturation Ratios: 20.2 % (ref 20.0–50.0)
TIBC: 337.4 ug/dL (ref 250.0–450.0)
Transferrin: 241 mg/dL (ref 212.0–360.0)

## 2021-06-22 LAB — HEPATIC FUNCTION PANEL
ALT: 12 U/L (ref 0–35)
AST: 17 U/L (ref 0–37)
Albumin: 4.4 g/dL (ref 3.5–5.2)
Alkaline Phosphatase: 53 U/L (ref 39–117)
Bilirubin, Direct: 0.1 mg/dL (ref 0.0–0.3)
Total Bilirubin: 0.6 mg/dL (ref 0.2–1.2)
Total Protein: 6.9 g/dL (ref 6.0–8.3)

## 2021-06-22 LAB — CBC WITH DIFFERENTIAL/PLATELET
Basophils Absolute: 0 10*3/uL (ref 0.0–0.1)
Basophils Relative: 0.7 % (ref 0.0–3.0)
Eosinophils Absolute: 0.1 10*3/uL (ref 0.0–0.7)
Eosinophils Relative: 1.5 % (ref 0.0–5.0)
HCT: 42.5 % (ref 36.0–46.0)
Hemoglobin: 14.5 g/dL (ref 12.0–15.0)
Lymphocytes Relative: 24 % (ref 12.0–46.0)
Lymphs Abs: 1.6 10*3/uL (ref 0.7–4.0)
MCHC: 34.1 g/dL (ref 30.0–36.0)
MCV: 90.1 fl (ref 78.0–100.0)
Monocytes Absolute: 1.1 10*3/uL — ABNORMAL HIGH (ref 0.1–1.0)
Monocytes Relative: 16.5 % — ABNORMAL HIGH (ref 3.0–12.0)
Neutro Abs: 3.8 10*3/uL (ref 1.4–7.7)
Neutrophils Relative %: 57.3 % (ref 43.0–77.0)
Platelets: 201 10*3/uL (ref 150.0–400.0)
RBC: 4.72 Mil/uL (ref 3.87–5.11)
RDW: 14.6 % (ref 11.5–15.5)
WBC: 6.7 10*3/uL (ref 4.0–10.5)

## 2021-06-22 LAB — LIPID PANEL
Cholesterol: 150 mg/dL (ref 0–200)
HDL: 63 mg/dL (ref >39.00)
LDL Cholesterol: 61 mg/dL (ref 0–99)
NonHDL: 87.25
Total CHOL/HDL Ratio: 2
Triglycerides: 132 mg/dL (ref 0.0–149.0)
VLDL: 26.4 mg/dL (ref 0.0–40.0)

## 2021-06-22 LAB — MICROALBUMIN / CREATININE URINE RATIO
Creatinine,U: 48.2 mg/dL
Microalb Creat Ratio: 1.5 mg/g (ref 0.0–30.0)
Microalb, Ur: <0.7 mg/dL (ref 0.0–1.9)

## 2021-06-22 LAB — HEMOGLOBIN A1C: Hgb A1c MFr Bld: 5.8 % (ref 4.6–6.5)

## 2021-06-22 NOTE — Progress Notes (Signed)
[name], female   DOB: 1940-08-09, 81 y.o.   MRN: [id number] ? ? ?Subjective:  ? ? [name], female    DOB: [birth year], 81 y.o.   MRN: [id number] ? ?This visit occurred during the SARS-CoV-2 public health emergency.  Safety protocols were in place, including screening questions prior to the visit, additional usage of staff PPE, and extensive cleaning of exam room while observing appropriate contact time as indicated for disinfecting solutions.  ? ?Patient here for a scheduled followup.  ? ?Chief Complaint  ?Patient presents with  ? Follow-up  ?  37MO F/U HTN, HLD  ? .  ? ?HPI ?She is accompanied by her caretaker.  History obtained from both of them.  She is living with her daughter.  Has her own space.  Appears to be doing well.  Eating.  No nausea or vomiting.  No abdominal pain.  Bowels moving.  No chest pain.  Breathing stable.   ? ? ?Past Medical History:  ?Diagnosis Date  ? Anemia   ? Arthritis   ? Atrial fibrillation ([place])   ? Breast cancer ([place])   ? s/p lumpectomy 1992.  s/p chemo and xrt left breast  ? Diabetes mellitus ([place])   ? Edema   ? feet/legs  ? Gastric ulcer   ? GERD (gastroesophageal reflux disease)   ? HOH (hard of hearing)   ? aides  ? Hypercholesterolemia   ? Hypertension   ? Personal history of chemotherapy   ? Pulmonary emboli ([place])   ? ?Past Surgical History:  ?Procedure Laterality Date  ? BREAST LUMPECTOMY  03/06/1990  ? left breast  ? CATARACT EXTRACTION W/PHACO Right 12/26/2016  ? Procedure: CATARACT EXTRACTION PHACO AND INTRAOCULAR LENS PLACEMENT (IOC)-RIGHT DIABETIC;  Surgeon: Birder Robson, MD;  Location: [facility] ORS;  Service: Ophthalmology;  Laterality: Right;  [place] 00:47 ?AP% 24.5 ?CDE 11.62 ?Fluid pack lot # [id number]  ? CATARACT EXTRACTION W/PHACO Left 01/23/2017  ? Procedure: CATARACT EXTRACTION PHACO AND INTRAOCULAR LENS PLACEMENT (IOC);  Surgeon: Birder Robson, MD;  Location: [facility] ORS;  Service: Ophthalmology;  Laterality: Left;  [place] 00:50 ?AP%  16.1 ?CDE 8.18 ?Fluid pack lot #[id number]  ? COLONOSCOPY WITH PROPOFOL N/A 07/08/2019  ? Procedure: COLONOSCOPY WITH PROPOFOL;  Surgeon: Lucilla Lame, MD;  Location: [facility] ENDOSCOPY;  Service: Endoscopy;  Laterality: N/A;  ? ?Family History  ?Problem Relation Age of Onset  ? Heart disease Father   ? Heart disease Brother   ?     s/p CABG  ? Breast cancer Daughter   ? Colon cancer Neg Hx   ? ?Social History  ? ?Socioeconomic History  ? Marital status: Widowed  ?  Spouse name: Not on file  ? Number of children: Not on file  ? Years of education: Not on file  ? Highest education level: Not on file  ?Occupational History  ? Not on file  ?Tobacco Use  ? Smoking status: Never  ? Smokeless tobacco: Never  ?Vaping Use  ? Vaping Use: Never used  ?Substance and Sexual Activity  ? Alcohol use: No  ?  Alcohol/week: 0.0 standard drinks  ? Drug use: No  ? Sexual activity: Never  ?Other Topics Concern  ? Not on file  ?Social History Narrative  ? Not on file  ? ?Social Determinants of Health  ? ?Financial Resource Strain: Low Risk   ? Difficulty of Paying Living Expenses: Not hard at all  ?Food Insecurity: No Food Insecurity  ?  Worried About Charity fundraiser in the Last Year: Never true  ? Ran Out of Food in the Last Year: Never true  ?Transportation Needs: No Transportation Needs  ? Lack of Transportation (Medical): No  ? Lack of Transportation (Non-Medical): No  ?Physical Activity: Insufficiently Active  ? Days of Exercise per Week: 7 days  ? Minutes of Exercise per Session: 20 min  ?Stress: No Stress Concern Present  ? Feeling of Stress : Not at all  ?Social Connections: Unknown  ? Frequency of Communication with Friends and Family: More than three times a week  ? Frequency of Social Gatherings with Friends and Family: More than three times a week  ? Attends Religious Services: Not on file  ? Active Member of Clubs or Organizations: Not on file  ? Attends Archivist Meetings: Not on file  ? Marital Status: Not on file   ? ? ? ?Review of Systems  ?Constitutional:  Negative for appetite change and unexpected weight change.  ?HENT:  Negative for congestion and sinus pressure.   ?Respiratory:  Negative for cough, chest tightness and shortness of breath.   ?Cardiovascular:  Negative for chest pain, palpitations and leg swelling.  ?Gastrointestinal:  Negative for abdominal pain, diarrhea, nausea and vomiting.  ?Genitourinary:  Negative for difficulty urinating and dysuria.  ?Musculoskeletal:  Negative for joint swelling and myalgias.  ?Skin:  Negative for color change and rash.  ?Neurological:  Negative for dizziness, light-headedness and headaches.  ?Psychiatric/Behavioral:  Negative for agitation and dysphoric mood.   ? ?   ?Objective:  ?  ? ?BP 136/80 (BP Location: Right Arm, Patient Position: Sitting, Cuff Size: Large)   Pulse 85   Temp 97.6 ?F (36.4 ?C) (Temporal)   Resp 16   Ht '5\' 8"'  (1.727 m)   Wt 249 lb 9.6 oz (113.2 kg)   SpO2 96%   BMI 37.95 kg/m?  ?Wt Readings from Last 3 Encounters:  ?06/22/21 249 lb 9.6 oz (113.2 kg)  ?04/27/21 250 lb (113.4 kg)  ?03/16/21 250 lb 3.2 oz (113.5 kg)  ? ? ?Physical Exam ? ? ?Outpatient Encounter Medications as of 06/22/2021  ?Medication Sig  ? acetaminophen (TYLENOL) 325 MG tablet Take 2 tablets (650 mg total) by mouth every 6 (six) hours as needed for moderate pain (headache).  ? amLODipine (NORVASC) 2.5 MG tablet TAKE 1 TABLET(2.5 MG) BY MOUTH DAILY  ? diphenhydrAMINE (BENADRYL) 25 mg capsule Take 25 mg at bedtime as needed by mouth for sleep.   ? feeding supplement (ENSURE ENLIVE / ENSURE PLUS) LIQD Take 237 mLs by mouth 2 (two) times daily between meals.  ? FEROSUL 325 (65 Fe) MG tablet TAKE 1 TABLET(325 MG) BY MOUTH DAILY  ? glucose blood (CONTOUR TEST) test strip USE TO TEST BLOOD SUGAR ONCE EVERY WEEK  ? losartan (COZAAR) 100 MG tablet TAKE 1 TABLET(100 MG) BY MOUTH DAILY  ? Multiple Vitamin (MULTIVITAMIN WITH MINERALS) TABS tablet Take 1 tablet daily by mouth.  ? pantoprazole  (PROTONIX) 40 MG tablet TAKE 1 TABLET(40 MG) BY MOUTH DAILY  ? simvastatin (ZOCOR) 10 MG tablet TAKE 1 TABLET BY MOUTH EVERY DAY  ? tiZANidine (ZANAFLEX) 2 MG tablet Take 4 mg by mouth 2 (two) times daily.  ? ?No facility-administered encounter medications on file as of 06/22/2021.  ?  ? ?Lab Results  ?Component Value Date  ? WBC 6.7 06/22/2021  ? HGB 14.5 06/22/2021  ? HCT 42.5 06/22/2021  ? PLT 201.0 06/22/2021  ? GLUCOSE 105 (  H) 06/22/2021  ? CHOL 150 06/22/2021  ? TRIG 132.0 06/22/2021  ? HDL 63.00 06/22/2021  ? [place] 61 06/22/2021  ? ALT 12 06/22/2021  ? AST 17 06/22/2021  ? NA 137 06/22/2021  ? K 4.7 06/22/2021  ? CL 102 06/22/2021  ? CREATININE 0.86 06/22/2021  ? BUN 22 06/22/2021  ? CO2 30 06/22/2021  ? TSH 3.53 03/16/2021  ? INR 1.2 (H) 07/22/2020  ? HGBA1C 5.8 06/22/2021  ? MICROALBUR <0.7 06/22/2021  ? ? ?MM 3D SCREEN BREAST BILATERAL ? ?Result Date: 12/29/2020 ?CLINICAL DATA:  Screening. EXAM: DIGITAL SCREENING BILATERAL MAMMOGRAM WITH TOMOSYNTHESIS AND CAD TECHNIQUE: Bilateral screening digital craniocaudal and mediolateral oblique mammograms were obtained. Bilateral screening digital breast tomosynthesis was performed. The images were evaluated with computer-aided detection. COMPARISON:  Previous exam(s). ACR Breast Density Category b: There are scattered areas of fibroglandular density. FINDINGS: There are no findings suspicious for malignancy. IMPRESSION: No mammographic evidence of malignancy. A result letter of this screening mammogram will be mailed directly to the patient. RECOMMENDATION: Screening mammogram in one year. (Code:SM-B-01Y) BI-RADS CATEGORY  1: Negative. Electronically Signed   By: Dorise Bullion III M.D.   On: 12/29/2020 16:50 ? ? ?   ?Assessment & Plan:  ? ?Problem List Items Addressed This Visit   ? ? Anemia  ? Relevant Orders  ? CBC with Differential/Platelet (Completed)  ? IBC + Ferritin (Completed)  ? 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  Found on previous CT head.  Mild.  Stable.  ? ?   ?  ? Diabetic polyneuropathy associated with type 2 diabetes mellitus ([place])  ?  Sugars have been well controlled.  Continue low carb diet and exercise.  Continue to use walker for ambulation.  Follow met b and a1

## 2021-06-26 ENCOUNTER — Encounter: Payer: Self-pay | Admitting: Internal Medicine

## 2021-06-26 NOTE — Assessment & Plan Note (Signed)
Found on previous CT head.  Mild.  Stable.  

## 2021-06-26 NOTE — Assessment & Plan Note (Signed)
Sugars have been well controlled.  Continue low carb diet and exercise.  Continue to use walker for ambulation.  Follow met b and a1c.  

## 2021-06-26 NOTE — Assessment & Plan Note (Signed)
Mammogram 12/29/20 - birads I.  

## 2021-06-26 NOTE — Assessment & Plan Note (Signed)
Continue losartan and amlodipine.  Follow pressures.  

## 2021-06-26 NOTE — Assessment & Plan Note (Signed)
Low carb diet and exercise as tolerated.  Follow met b and a1c.   

## 2021-06-26 NOTE — Assessment & Plan Note (Signed)
On simvastatin.  Low cholesterol diet and exercise.  Follow lipid panel and liver function tests.   

## 2021-06-29 ENCOUNTER — Encounter: Payer: Self-pay | Admitting: Internal Medicine

## 2021-06-29 ENCOUNTER — Other Ambulatory Visit: Payer: Self-pay

## 2021-06-29 DIAGNOSIS — D649 Anemia, unspecified: Secondary | ICD-10-CM

## 2021-06-29 NOTE — Telephone Encounter (Signed)
I was able to speak with patient & results given. IBC & Ferritin ordered & scheduled for 2 months out.  ?

## 2021-06-30 ENCOUNTER — Other Ambulatory Visit: Payer: Self-pay | Admitting: Internal Medicine

## 2021-06-30 DIAGNOSIS — D649 Anemia, unspecified: Secondary | ICD-10-CM

## 2021-06-30 NOTE — Progress Notes (Signed)
Order placed for f/u cbc.   

## 2021-07-08 ENCOUNTER — Telehealth: Payer: Self-pay | Admitting: Internal Medicine

## 2021-07-08 ENCOUNTER — Encounter: Payer: Self-pay | Admitting: Internal Medicine

## 2021-07-08 ENCOUNTER — Other Ambulatory Visit: Payer: Self-pay | Admitting: Internal Medicine

## 2021-07-08 ENCOUNTER — Ambulatory Visit
Admission: RE | Admit: 2021-07-08 | Discharge: 2021-07-08 | Disposition: A | Discharge status: Home / Self Care | Payer: Medicare Other | Source: Ambulatory Visit | Attending: Internal Medicine | Admitting: Internal Medicine

## 2021-07-08 ENCOUNTER — Ambulatory Visit (INDEPENDENT_AMBULATORY_CARE_PROVIDER_SITE_OTHER): Payer: Medicare Other | Admitting: Internal Medicine

## 2021-07-08 VITALS — BP 130/76 | HR 94 | Temp 97.5°F | Resp 14 | Ht 68.0 in | Wt 252.0 lb

## 2021-07-08 DIAGNOSIS — R296 Repeated falls: Secondary | ICD-10-CM

## 2021-07-08 DIAGNOSIS — Z9181 History of falling: Secondary | ICD-10-CM

## 2021-07-08 DIAGNOSIS — R29898 Other symptoms and signs involving the musculoskeletal system: Secondary | ICD-10-CM

## 2021-07-08 DIAGNOSIS — M7989 Other specified soft tissue disorders: Secondary | ICD-10-CM

## 2021-07-08 DIAGNOSIS — M79605 Pain in left leg: Secondary | ICD-10-CM

## 2021-07-08 DIAGNOSIS — M1990 Unspecified osteoarthritis, unspecified site: Secondary | ICD-10-CM | POA: Diagnosis not present

## 2021-07-08 DIAGNOSIS — R269 Unspecified abnormalities of gait and mobility: Secondary | ICD-10-CM | POA: Diagnosis not present

## 2021-07-08 NOTE — Telephone Encounter (Signed)
Patient's daughter called and would like office to know to call her about ultra sound results. Patient is hard of hearing and will not understand results. ?

## 2021-07-08 NOTE — Addendum Note (Signed)
Addended by: Orland Mustard on: 07/08/2021 02:00 PM ? ? Modules accepted: Orders ? ?

## 2021-07-08 NOTE — Progress Notes (Signed)
Chief Complaint  ?Patient presents with  ?? Acute Visit  ?  Pt c/o L leg spasms, pt tripped x2 yesterday, unable to raise foot. Noticed that L calf is larger than R. Hx of neuropathy  ? ?F/u with daughter  ?1. C/o left leg foot spasm and could not lift up foot tripped x 2 yesterday left calf is painful and larger than right worse yesterday h/o neuropathy, cervical arthritis, low back arthritis , left hip and left knee arthritis. Shes noticed her left leg shaking and muscles twitching no numbness or tingling ? ? ?Review of Systems  ?Constitutional:  Negative for weight loss.  ?HENT:  Negative for hearing loss.   ?Eyes:  Negative for blurred vision.  ?Respiratory:  Negative for shortness of breath.   ?Cardiovascular:  Negative for chest pain.  ?Gastrointestinal:  Negative for abdominal pain and blood in stool.  ?Musculoskeletal:  Negative for back pain.  ?Skin:  Negative for rash.  ?Neurological:  Negative for headaches.  ?Psychiatric/Behavioral:  Negative for depression.   ?Past Medical History:  ?Diagnosis Date  ?? Anemia   ?? Arthritis   ?? Atrial fibrillation ([place])   ?? Breast cancer ([place])   ? s/p lumpectomy 1992.  s/p chemo and xrt left breast  ?? Diabetes mellitus ([place])   ?? Edema   ? feet/legs  ?? Gastric ulcer   ?? GERD (gastroesophageal reflux disease)   ?? HOH (hard of hearing)   ? aides  ?? Hypercholesterolemia   ?? Hypertension   ?? Personal history of chemotherapy   ?? Pulmonary emboli ([place])   ? ?Past Surgical History:  ?Procedure Laterality Date  ?? BREAST LUMPECTOMY  03/06/1990  ? left breast  ?? CATARACT EXTRACTION W/PHACO Right 12/26/2016  ? Procedure: CATARACT EXTRACTION PHACO AND INTRAOCULAR LENS PLACEMENT (IOC)-RIGHT DIABETIC;  Surgeon: Birder Robson, MD;  Location: [facility] ORS;  Service: Ophthalmology;  Laterality: Right;  [place] 00:47 ?AP% 24.5 ?CDE 11.62 ?Fluid pack lot # [id number]  ?? CATARACT EXTRACTION W/PHACO Left 01/23/2017  ? Procedure: CATARACT EXTRACTION PHACO AND INTRAOCULAR LENS PLACEMENT  (IOC);  Surgeon: Birder Robson, MD;  Location: [facility] ORS;  Service: Ophthalmology;  Laterality: Left;  [place] 00:50 ?AP% 16.1 ?CDE 8.18 ?Fluid pack lot #[id number]  ?? COLONOSCOPY WITH PROPOFOL N/A 07/08/2019  ? Procedure: COLONOSCOPY WITH PROPOFOL;  Surgeon: Lucilla Lame, MD;  [facility]: [facility] ENDOSCOPY;  Service: Endoscopy;  Laterality: N/A;  ? ?Family History  ?Problem Relation Age of Onset  ?? Heart disease Father   ?? Heart disease Brother   ?     s/p CABG  ?? Breast cancer Daughter   ?? Colon cancer Neg Hx   ? ?Social History  ? ?Socioeconomic History  ?? Marital status: Widowed  ?  Spouse name: Not on file  ?? Number of children: Not on file  ?? Years of education: Not on file  ?? Highest education level: Not on file  ?Occupational History  ?? Not on file  ?Tobacco Use  ?? Smoking status: Never  ?? Smokeless tobacco: Never  ?Vaping Use  ?? Vaping Use: Never used  ?Substance and Sexual Activity  ?? Alcohol use: No  ?  Alcohol/week: 0.0 standard drinks  ?? Drug use: No  ?? Sexual activity: Never  ?Other Topics Concern  ?? Not on file  ?Social History Narrative  ?? Not on file  ? ?Social Determinants of Health  ? ?Financial Resource Strain: Low Risk   ?? Difficulty of Paying Living Expenses: Not hard at all  ?Food Insecurity:  No Food Insecurity  ?? Worried About Charity fundraiser in the Last Year: Never true  ?? Ran Out of Food in the Last Year: Never true  ?Transportation Needs: No Transportation Needs  ?? Lack of Transportation (Medical): No  ?? Lack of Transportation (Non-Medical): No  ?Physical Activity: Insufficiently Active  ?? Days of Exercise per Week: 7 days  ?? Minutes of Exercise per Session: 20 min  ?Stress: No Stress Concern Present  ?? Feeling of Stress : Not at all  ?Social Connections: Unknown  ?? Frequency of Communication with Friends and Family: More than three times a week  ?? Frequency of Social Gatherings with Friends and Family: More than three times a week  ?? Attends Religious Services: Not  on file  ?? Active Member of Clubs or Organizations: Not on file  ?? Attends Archivist Meetings: Not on file  ?? Marital Status: Not on file  ?Intimate Partner Violence: Not At Risk  ?? Fear of Current or Ex-Partner: No  ?? Emotionally Abused: No  ?? Physically Abused: No  ?? Sexually Abused: No  ? ?Current Meds  ?Medication Sig  ?? acetaminophen (TYLENOL) 325 MG tablet Take 2 tablets (650 mg total) by mouth every 6 (six) hours as needed for moderate pain (headache).  ?? amLODipine (NORVASC) 2.5 MG tablet TAKE 1 TABLET(2.5 MG) BY MOUTH DAILY  ?? diphenhydrAMINE (BENADRYL) 25 mg capsule Take 25 mg at bedtime as needed by mouth for sleep.   ?? feeding supplement (ENSURE ENLIVE / ENSURE PLUS) LIQD Take 237 mLs by mouth 2 (two) times daily between meals.  ?? FEROSUL 325 (65 Fe) MG tablet TAKE 1 TABLET(325 MG) BY MOUTH DAILY  ?? glucose blood (CONTOUR TEST) test strip USE TO TEST BLOOD SUGAR ONCE EVERY WEEK  ?? losartan (COZAAR) 100 MG tablet TAKE 1 TABLET(100 MG) BY MOUTH DAILY  ?? Multiple Vitamin (MULTIVITAMIN WITH MINERALS) TABS tablet Take 1 tablet daily by mouth.  ?? pantoprazole (PROTONIX) 40 MG tablet TAKE 1 TABLET(40 MG) BY MOUTH DAILY  ?? simvastatin (ZOCOR) 10 MG tablet TAKE 1 TABLET BY MOUTH EVERY DAY  ? ?Allergies  ?Allergen Reactions  ?? Penicillins Other (See Comments)  ?  Unknown- pt states been a long time ago  ?Has patient had a PCN reaction causing immediate rash, facial/tongue/throat swelling, SOB or lightheadedness with hypotension: Unknown ?Has patient had a PCN reaction causing severe rash involving mucus membranes or skin necrosis: Unknown ?Has patient had a PCN reaction that required hospitalization: Unknown ?Has patient had a PCN reaction occurring within the last 10 years: Unknown ?If all of the above answers are "NO", then may proceed with Cephalosporin use.  ?? Penicillin V Potassium Nausea And Vomiting  ? ?Recent Results (from the past 2160 hour(s))  ?Lipid Profile     Status:  None  ? Collection Time: 06/22/21 11:15 AM  ?Result Value Ref Range  ? Cholesterol 150 0 - 200 mg/dL  ?  Comment: ATP III Classification       Desirable:  < 200 mg/dL               Borderline High:  200 - 239 mg/dL          High:  > = 240 mg/dL  ? Triglycerides 132.0 0.0 - 149.0 mg/dL  ?  Comment: Normal:  <150 mg/dLBorderline High:  150 - 199 mg/dL  ? HDL 63.00 >39.00 mg/dL  ? VLDL 26.4 0.0 - 40.0 mg/dL  ? LDL Cholesterol 61 0 -  99 mg/dL  ? Total CHOL/HDL Ratio 2   ?  Comment:                Men          Women1/2 Average Risk     3.4          3.3Average Risk          5.0          4.42X Average Risk          9.6          7.13X Average Risk          15.0          11.0                      ? NonHDL 87.25   ?  Comment: NOTE:  Non-HDL goal should be 30 mg/dL higher than patient's LDL goal (i.e. LDL goal of < 70 mg/dL, would have non-HDL goal of < 100 mg/dL)  ?Basic Metabolic Panel (BMET)     Status: Abnormal  ? Collection Time: 06/22/21 11:15 AM  ?Result Value Ref Range  ? Sodium 137 135 - 145 mEq/L  ? Potassium 4.7 3.5 - 5.1 mEq/L  ? Chloride 102 96 - 112 mEq/L  ? CO2 30 19 - 32 mEq/L  ? Glucose, Bld 105 (H) 70 - 99 mg/dL  ? BUN 22 6 - 23 mg/dL  ? Creatinine, Ser 0.86 0.40 - 1.20 mg/dL  ? GFR 62.55 >60.00 mL/min  ?  Comment: Calculated using the CKD-EPI Creatinine Equation (2021)  ? Calcium 9.8 8.4 - 10.5 mg/dL  ?Hepatic function panel     Status: None  ? Collection Time: 06/22/21 11:15 AM  ?Result Value Ref Range  ? Total Bilirubin 0.6 0.2 - 1.2 mg/dL  ? Bilirubin, Direct 0.1 0.0 - 0.3 mg/dL  ? Alkaline Phosphatase 53 39 - 117 U/L  ? AST 17 0 - 37 U/L  ? ALT 12 0 - 35 U/L  ? Total Protein 6.9 6.0 - 8.3 g/dL  ? Albumin 4.4 3.5 - 5.2 g/dL  ?HgB A1c     Status: None  ? Collection Time: 06/22/21 11:15 AM  ?Result Value Ref Range  ? Hgb A1c MFr Bld 5.8 4.6 - 6.5 %  ?  Comment: Glycemic Control Guidelines for People with Diabetes:Non Diabetic:  <6%Goal of Therapy: <7%Additional Action Suggested:  >8%   ?CBC with  Differential/Platelet     Status: Abnormal  ? Collection Time: 06/22/21 11:15 AM  ?Result Value Ref Range  ? WBC 6.7 4.0 - 10.5 K/uL  ? RBC 4.72 3.87 - 5.11 Mil/uL  ? Hemoglobin 14.5 12.0 - 15.0 g/dL  ? HCT 42.5 36.0

## 2021-07-15 DIAGNOSIS — E7800 Pure hypercholesterolemia, unspecified: Secondary | ICD-10-CM | POA: Diagnosis not present

## 2021-07-15 DIAGNOSIS — Z86711 Personal history of pulmonary embolism: Secondary | ICD-10-CM | POA: Diagnosis not present

## 2021-07-15 DIAGNOSIS — I10 Essential (primary) hypertension: Secondary | ICD-10-CM | POA: Diagnosis not present

## 2021-07-15 DIAGNOSIS — K219 Gastro-esophageal reflux disease without esophagitis: Secondary | ICD-10-CM | POA: Diagnosis not present

## 2021-07-15 DIAGNOSIS — M47812 Spondylosis without myelopathy or radiculopathy, cervical region: Secondary | ICD-10-CM | POA: Diagnosis not present

## 2021-07-15 DIAGNOSIS — D649 Anemia, unspecified: Secondary | ICD-10-CM | POA: Diagnosis not present

## 2021-07-15 DIAGNOSIS — M1712 Unilateral primary osteoarthritis, left knee: Secondary | ICD-10-CM | POA: Diagnosis not present

## 2021-07-15 DIAGNOSIS — K259 Gastric ulcer, unspecified as acute or chronic, without hemorrhage or perforation: Secondary | ICD-10-CM | POA: Diagnosis not present

## 2021-07-15 DIAGNOSIS — M21372 Foot drop, left foot: Secondary | ICD-10-CM | POA: Diagnosis not present

## 2021-07-15 DIAGNOSIS — E1140 Type 2 diabetes mellitus with diabetic neuropathy, unspecified: Secondary | ICD-10-CM | POA: Diagnosis not present

## 2021-07-15 DIAGNOSIS — H9190 Unspecified hearing loss, unspecified ear: Secondary | ICD-10-CM | POA: Diagnosis not present

## 2021-07-15 DIAGNOSIS — Z853 Personal history of malignant neoplasm of breast: Secondary | ICD-10-CM | POA: Diagnosis not present

## 2021-07-15 DIAGNOSIS — M1612 Unilateral primary osteoarthritis, left hip: Secondary | ICD-10-CM | POA: Diagnosis not present

## 2021-07-15 DIAGNOSIS — I4891 Unspecified atrial fibrillation: Secondary | ICD-10-CM | POA: Diagnosis not present

## 2021-07-19 DIAGNOSIS — I10 Essential (primary) hypertension: Secondary | ICD-10-CM | POA: Diagnosis not present

## 2021-07-19 DIAGNOSIS — M1712 Unilateral primary osteoarthritis, left knee: Secondary | ICD-10-CM | POA: Diagnosis not present

## 2021-07-19 DIAGNOSIS — E1140 Type 2 diabetes mellitus with diabetic neuropathy, unspecified: Secondary | ICD-10-CM | POA: Diagnosis not present

## 2021-07-19 DIAGNOSIS — M1612 Unilateral primary osteoarthritis, left hip: Secondary | ICD-10-CM | POA: Diagnosis not present

## 2021-07-19 DIAGNOSIS — M47812 Spondylosis without myelopathy or radiculopathy, cervical region: Secondary | ICD-10-CM | POA: Diagnosis not present

## 2021-07-19 DIAGNOSIS — M21372 Foot drop, left foot: Secondary | ICD-10-CM | POA: Diagnosis not present

## 2021-07-21 DIAGNOSIS — M1612 Unilateral primary osteoarthritis, left hip: Secondary | ICD-10-CM | POA: Diagnosis not present

## 2021-07-21 DIAGNOSIS — M21372 Foot drop, left foot: Secondary | ICD-10-CM | POA: Diagnosis not present

## 2021-07-21 DIAGNOSIS — E1140 Type 2 diabetes mellitus with diabetic neuropathy, unspecified: Secondary | ICD-10-CM | POA: Diagnosis not present

## 2021-07-21 DIAGNOSIS — M1712 Unilateral primary osteoarthritis, left knee: Secondary | ICD-10-CM | POA: Diagnosis not present

## 2021-07-21 DIAGNOSIS — M47812 Spondylosis without myelopathy or radiculopathy, cervical region: Secondary | ICD-10-CM | POA: Diagnosis not present

## 2021-07-21 DIAGNOSIS — I10 Essential (primary) hypertension: Secondary | ICD-10-CM | POA: Diagnosis not present

## 2021-07-28 DIAGNOSIS — M1712 Unilateral primary osteoarthritis, left knee: Secondary | ICD-10-CM | POA: Diagnosis not present

## 2021-07-28 DIAGNOSIS — M1612 Unilateral primary osteoarthritis, left hip: Secondary | ICD-10-CM | POA: Diagnosis not present

## 2021-07-28 DIAGNOSIS — M21372 Foot drop, left foot: Secondary | ICD-10-CM | POA: Diagnosis not present

## 2021-07-28 DIAGNOSIS — E1140 Type 2 diabetes mellitus with diabetic neuropathy, unspecified: Secondary | ICD-10-CM | POA: Diagnosis not present

## 2021-07-28 DIAGNOSIS — I10 Essential (primary) hypertension: Secondary | ICD-10-CM | POA: Diagnosis not present

## 2021-07-28 DIAGNOSIS — M47812 Spondylosis without myelopathy or radiculopathy, cervical region: Secondary | ICD-10-CM | POA: Diagnosis not present

## 2021-07-29 DIAGNOSIS — M1712 Unilateral primary osteoarthritis, left knee: Secondary | ICD-10-CM | POA: Diagnosis not present

## 2021-07-29 DIAGNOSIS — E1140 Type 2 diabetes mellitus with diabetic neuropathy, unspecified: Secondary | ICD-10-CM | POA: Diagnosis not present

## 2021-07-29 DIAGNOSIS — M21372 Foot drop, left foot: Secondary | ICD-10-CM | POA: Diagnosis not present

## 2021-07-29 DIAGNOSIS — M47812 Spondylosis without myelopathy or radiculopathy, cervical region: Secondary | ICD-10-CM | POA: Diagnosis not present

## 2021-07-29 DIAGNOSIS — M1612 Unilateral primary osteoarthritis, left hip: Secondary | ICD-10-CM | POA: Diagnosis not present

## 2021-07-29 DIAGNOSIS — I10 Essential (primary) hypertension: Secondary | ICD-10-CM | POA: Diagnosis not present

## 2021-08-04 DIAGNOSIS — I10 Essential (primary) hypertension: Secondary | ICD-10-CM | POA: Diagnosis not present

## 2021-08-04 DIAGNOSIS — M1612 Unilateral primary osteoarthritis, left hip: Secondary | ICD-10-CM | POA: Diagnosis not present

## 2021-08-04 DIAGNOSIS — E1140 Type 2 diabetes mellitus with diabetic neuropathy, unspecified: Secondary | ICD-10-CM | POA: Diagnosis not present

## 2021-08-04 DIAGNOSIS — M21372 Foot drop, left foot: Secondary | ICD-10-CM | POA: Diagnosis not present

## 2021-08-04 DIAGNOSIS — M47812 Spondylosis without myelopathy or radiculopathy, cervical region: Secondary | ICD-10-CM | POA: Diagnosis not present

## 2021-08-04 DIAGNOSIS — M1712 Unilateral primary osteoarthritis, left knee: Secondary | ICD-10-CM | POA: Diagnosis not present

## 2021-08-08 DIAGNOSIS — Z853 Personal history of malignant neoplasm of breast: Secondary | ICD-10-CM

## 2021-08-08 DIAGNOSIS — M1712 Unilateral primary osteoarthritis, left knee: Secondary | ICD-10-CM | POA: Diagnosis not present

## 2021-08-08 DIAGNOSIS — K219 Gastro-esophageal reflux disease without esophagitis: Secondary | ICD-10-CM | POA: Diagnosis not present

## 2021-08-08 DIAGNOSIS — D649 Anemia, unspecified: Secondary | ICD-10-CM | POA: Diagnosis not present

## 2021-08-08 DIAGNOSIS — I10 Essential (primary) hypertension: Secondary | ICD-10-CM | POA: Diagnosis not present

## 2021-08-08 DIAGNOSIS — M21372 Foot drop, left foot: Secondary | ICD-10-CM | POA: Diagnosis not present

## 2021-08-08 DIAGNOSIS — H9190 Unspecified hearing loss, unspecified ear: Secondary | ICD-10-CM | POA: Diagnosis not present

## 2021-08-08 DIAGNOSIS — Z86711 Personal history of pulmonary embolism: Secondary | ICD-10-CM

## 2021-08-08 DIAGNOSIS — K259 Gastric ulcer, unspecified as acute or chronic, without hemorrhage or perforation: Secondary | ICD-10-CM | POA: Diagnosis not present

## 2021-08-08 DIAGNOSIS — M1612 Unilateral primary osteoarthritis, left hip: Secondary | ICD-10-CM | POA: Diagnosis not present

## 2021-08-08 DIAGNOSIS — I4891 Unspecified atrial fibrillation: Secondary | ICD-10-CM | POA: Diagnosis not present

## 2021-08-08 DIAGNOSIS — E7800 Pure hypercholesterolemia, unspecified: Secondary | ICD-10-CM | POA: Diagnosis not present

## 2021-08-08 DIAGNOSIS — M47812 Spondylosis without myelopathy or radiculopathy, cervical region: Secondary | ICD-10-CM | POA: Diagnosis not present

## 2021-08-08 DIAGNOSIS — E1140 Type 2 diabetes mellitus with diabetic neuropathy, unspecified: Secondary | ICD-10-CM | POA: Diagnosis not present

## 2021-08-10 ENCOUNTER — Other Ambulatory Visit: Payer: Self-pay

## 2021-08-11 DIAGNOSIS — E1140 Type 2 diabetes mellitus with diabetic neuropathy, unspecified: Secondary | ICD-10-CM | POA: Diagnosis not present

## 2021-08-11 DIAGNOSIS — I10 Essential (primary) hypertension: Secondary | ICD-10-CM | POA: Diagnosis not present

## 2021-08-11 DIAGNOSIS — M1712 Unilateral primary osteoarthritis, left knee: Secondary | ICD-10-CM | POA: Diagnosis not present

## 2021-08-11 DIAGNOSIS — M21372 Foot drop, left foot: Secondary | ICD-10-CM | POA: Diagnosis not present

## 2021-08-11 DIAGNOSIS — M1612 Unilateral primary osteoarthritis, left hip: Secondary | ICD-10-CM | POA: Diagnosis not present

## 2021-08-11 DIAGNOSIS — M47812 Spondylosis without myelopathy or radiculopathy, cervical region: Secondary | ICD-10-CM | POA: Diagnosis not present

## 2021-08-14 DIAGNOSIS — Z853 Personal history of malignant neoplasm of breast: Secondary | ICD-10-CM | POA: Diagnosis not present

## 2021-08-14 DIAGNOSIS — M21372 Foot drop, left foot: Secondary | ICD-10-CM | POA: Diagnosis not present

## 2021-08-14 DIAGNOSIS — M47812 Spondylosis without myelopathy or radiculopathy, cervical region: Secondary | ICD-10-CM | POA: Diagnosis not present

## 2021-08-14 DIAGNOSIS — E7800 Pure hypercholesterolemia, unspecified: Secondary | ICD-10-CM | POA: Diagnosis not present

## 2021-08-14 DIAGNOSIS — I4891 Unspecified atrial fibrillation: Secondary | ICD-10-CM | POA: Diagnosis not present

## 2021-08-14 DIAGNOSIS — E1140 Type 2 diabetes mellitus with diabetic neuropathy, unspecified: Secondary | ICD-10-CM | POA: Diagnosis not present

## 2021-08-14 DIAGNOSIS — K259 Gastric ulcer, unspecified as acute or chronic, without hemorrhage or perforation: Secondary | ICD-10-CM | POA: Diagnosis not present

## 2021-08-14 DIAGNOSIS — M1712 Unilateral primary osteoarthritis, left knee: Secondary | ICD-10-CM | POA: Diagnosis not present

## 2021-08-14 DIAGNOSIS — K219 Gastro-esophageal reflux disease without esophagitis: Secondary | ICD-10-CM | POA: Diagnosis not present

## 2021-08-14 DIAGNOSIS — H9190 Unspecified hearing loss, unspecified ear: Secondary | ICD-10-CM | POA: Diagnosis not present

## 2021-08-14 DIAGNOSIS — I10 Essential (primary) hypertension: Secondary | ICD-10-CM | POA: Diagnosis not present

## 2021-08-14 DIAGNOSIS — M1612 Unilateral primary osteoarthritis, left hip: Secondary | ICD-10-CM | POA: Diagnosis not present

## 2021-08-14 DIAGNOSIS — Z86711 Personal history of pulmonary embolism: Secondary | ICD-10-CM | POA: Diagnosis not present

## 2021-08-14 DIAGNOSIS — D649 Anemia, unspecified: Secondary | ICD-10-CM | POA: Diagnosis not present

## 2021-08-17 ENCOUNTER — Other Ambulatory Visit: Payer: Self-pay | Admitting: Internal Medicine

## 2021-08-17 ENCOUNTER — Telehealth: Payer: Self-pay

## 2021-08-17 NOTE — Telephone Encounter (Signed)
LM FOR PT DAUGHTER JOANNA RE: NEED TO REVIEW MEDS

## 2021-08-18 DIAGNOSIS — I10 Essential (primary) hypertension: Secondary | ICD-10-CM | POA: Diagnosis not present

## 2021-08-18 DIAGNOSIS — M1612 Unilateral primary osteoarthritis, left hip: Secondary | ICD-10-CM | POA: Diagnosis not present

## 2021-08-18 DIAGNOSIS — M21372 Foot drop, left foot: Secondary | ICD-10-CM | POA: Diagnosis not present

## 2021-08-18 DIAGNOSIS — E1140 Type 2 diabetes mellitus with diabetic neuropathy, unspecified: Secondary | ICD-10-CM | POA: Diagnosis not present

## 2021-08-18 DIAGNOSIS — M1712 Unilateral primary osteoarthritis, left knee: Secondary | ICD-10-CM | POA: Diagnosis not present

## 2021-08-18 DIAGNOSIS — M47812 Spondylosis without myelopathy or radiculopathy, cervical region: Secondary | ICD-10-CM | POA: Diagnosis not present

## 2021-08-28 DIAGNOSIS — Z6836 Body mass index (BMI) 36.0-36.9, adult: Secondary | ICD-10-CM | POA: Diagnosis not present

## 2021-08-28 DIAGNOSIS — R300 Dysuria: Secondary | ICD-10-CM | POA: Diagnosis not present

## 2021-08-28 DIAGNOSIS — N390 Urinary tract infection, site not specified: Secondary | ICD-10-CM | POA: Diagnosis not present

## 2021-08-29 ENCOUNTER — Other Ambulatory Visit (INDEPENDENT_AMBULATORY_CARE_PROVIDER_SITE_OTHER): Payer: Medicare Other

## 2021-08-29 DIAGNOSIS — E1140 Type 2 diabetes mellitus with diabetic neuropathy, unspecified: Secondary | ICD-10-CM | POA: Diagnosis not present

## 2021-08-29 DIAGNOSIS — D649 Anemia, unspecified: Secondary | ICD-10-CM | POA: Diagnosis not present

## 2021-08-29 DIAGNOSIS — M1712 Unilateral primary osteoarthritis, left knee: Secondary | ICD-10-CM | POA: Diagnosis not present

## 2021-08-29 DIAGNOSIS — M47812 Spondylosis without myelopathy or radiculopathy, cervical region: Secondary | ICD-10-CM | POA: Diagnosis not present

## 2021-08-29 DIAGNOSIS — I10 Essential (primary) hypertension: Secondary | ICD-10-CM | POA: Diagnosis not present

## 2021-08-29 DIAGNOSIS — M1612 Unilateral primary osteoarthritis, left hip: Secondary | ICD-10-CM | POA: Diagnosis not present

## 2021-08-29 DIAGNOSIS — M21372 Foot drop, left foot: Secondary | ICD-10-CM | POA: Diagnosis not present

## 2021-08-29 LAB — CBC WITH DIFFERENTIAL/PLATELET
Basophils Absolute: 0 10*3/uL (ref 0.0–0.1)
Basophils Relative: 0.5 % (ref 0.0–3.0)
Eosinophils Absolute: 0.1 10*3/uL (ref 0.0–0.7)
Eosinophils Relative: 1.3 % (ref 0.0–5.0)
HCT: 43.4 % (ref 36.0–46.0)
Hemoglobin: 14.3 g/dL (ref 12.0–15.0)
Lymphocytes Relative: 28.9 % (ref 12.0–46.0)
Lymphs Abs: 1.7 10*3/uL (ref 0.7–4.0)
MCHC: 32.9 g/dL (ref 30.0–36.0)
MCV: 91.5 fl (ref 78.0–100.0)
Monocytes Absolute: 0.9 10*3/uL (ref 0.1–1.0)
Monocytes Relative: 15 % — ABNORMAL HIGH (ref 3.0–12.0)
Neutro Abs: 3.1 10*3/uL (ref 1.4–7.7)
Neutrophils Relative %: 54.3 % (ref 43.0–77.0)
Platelets: 213 10*3/uL (ref 150.0–400.0)
RBC: 4.75 Mil/uL (ref 3.87–5.11)
RDW: 14.6 % (ref 11.5–15.5)
WBC: 5.8 10*3/uL (ref 4.0–10.5)

## 2021-08-29 LAB — IBC + FERRITIN
Ferritin: 84 ng/mL (ref 10.0–291.0)
Iron: 134 ug/dL (ref 42–145)
Saturation Ratios: 37.1 % (ref 20.0–50.0)
TIBC: 361.2 ug/dL (ref 250.0–450.0)
Transferrin: 258 mg/dL (ref 212.0–360.0)

## 2021-08-30 ENCOUNTER — Other Ambulatory Visit: Payer: Self-pay | Admitting: Internal Medicine

## 2021-08-30 DIAGNOSIS — E1149 Type 2 diabetes mellitus with other diabetic neurological complication: Secondary | ICD-10-CM

## 2021-08-30 DIAGNOSIS — D649 Anemia, unspecified: Secondary | ICD-10-CM

## 2021-08-30 DIAGNOSIS — I10 Essential (primary) hypertension: Secondary | ICD-10-CM

## 2021-08-30 DIAGNOSIS — E7800 Pure hypercholesterolemia, unspecified: Secondary | ICD-10-CM

## 2021-08-31 ENCOUNTER — Telehealth: Payer: Self-pay

## 2021-08-31 NOTE — Telephone Encounter (Signed)
Crystal called from [facility] to state they received orders signed by Dr. Einar Pheasant, but the orders had Dr. Olivia Mackie McLean-Scocuzza listed at the provider.  I let Crystal know that Dr. Einar Pheasant is the patient's provider.  Crystal states she will re-fax a revised copy of the order with Dr. Nicki Reaper listed as the patient's provider.  Crystal asked that Dr. Nicki Reaper please sign the revised order and fax it back.

## 2021-09-12 DIAGNOSIS — M1712 Unilateral primary osteoarthritis, left knee: Secondary | ICD-10-CM | POA: Diagnosis not present

## 2021-09-12 DIAGNOSIS — M47812 Spondylosis without myelopathy or radiculopathy, cervical region: Secondary | ICD-10-CM | POA: Diagnosis not present

## 2021-09-12 DIAGNOSIS — I10 Essential (primary) hypertension: Secondary | ICD-10-CM | POA: Diagnosis not present

## 2021-09-12 DIAGNOSIS — M21372 Foot drop, left foot: Secondary | ICD-10-CM | POA: Diagnosis not present

## 2021-09-12 DIAGNOSIS — E1140 Type 2 diabetes mellitus with diabetic neuropathy, unspecified: Secondary | ICD-10-CM | POA: Diagnosis not present

## 2021-09-12 DIAGNOSIS — M1612 Unilateral primary osteoarthritis, left hip: Secondary | ICD-10-CM | POA: Diagnosis not present

## 2021-09-13 DIAGNOSIS — I4891 Unspecified atrial fibrillation: Secondary | ICD-10-CM | POA: Diagnosis not present

## 2021-09-13 DIAGNOSIS — M21372 Foot drop, left foot: Secondary | ICD-10-CM | POA: Diagnosis not present

## 2021-09-13 DIAGNOSIS — M1612 Unilateral primary osteoarthritis, left hip: Secondary | ICD-10-CM | POA: Diagnosis not present

## 2021-09-13 DIAGNOSIS — E1140 Type 2 diabetes mellitus with diabetic neuropathy, unspecified: Secondary | ICD-10-CM | POA: Diagnosis not present

## 2021-09-13 DIAGNOSIS — M1712 Unilateral primary osteoarthritis, left knee: Secondary | ICD-10-CM | POA: Diagnosis not present

## 2021-09-13 DIAGNOSIS — D649 Anemia, unspecified: Secondary | ICD-10-CM | POA: Diagnosis not present

## 2021-09-13 DIAGNOSIS — Z86711 Personal history of pulmonary embolism: Secondary | ICD-10-CM | POA: Diagnosis not present

## 2021-09-13 DIAGNOSIS — H9190 Unspecified hearing loss, unspecified ear: Secondary | ICD-10-CM | POA: Diagnosis not present

## 2021-09-13 DIAGNOSIS — Z853 Personal history of malignant neoplasm of breast: Secondary | ICD-10-CM | POA: Diagnosis not present

## 2021-09-13 DIAGNOSIS — K219 Gastro-esophageal reflux disease without esophagitis: Secondary | ICD-10-CM | POA: Diagnosis not present

## 2021-09-13 DIAGNOSIS — K259 Gastric ulcer, unspecified as acute or chronic, without hemorrhage or perforation: Secondary | ICD-10-CM | POA: Diagnosis not present

## 2021-09-13 DIAGNOSIS — I10 Essential (primary) hypertension: Secondary | ICD-10-CM | POA: Diagnosis not present

## 2021-09-13 DIAGNOSIS — M47812 Spondylosis without myelopathy or radiculopathy, cervical region: Secondary | ICD-10-CM | POA: Diagnosis not present

## 2021-09-13 DIAGNOSIS — E7800 Pure hypercholesterolemia, unspecified: Secondary | ICD-10-CM | POA: Diagnosis not present

## 2021-09-16 DIAGNOSIS — I10 Essential (primary) hypertension: Secondary | ICD-10-CM | POA: Diagnosis not present

## 2021-09-16 DIAGNOSIS — M21372 Foot drop, left foot: Secondary | ICD-10-CM | POA: Diagnosis not present

## 2021-09-16 DIAGNOSIS — M1712 Unilateral primary osteoarthritis, left knee: Secondary | ICD-10-CM | POA: Diagnosis not present

## 2021-09-16 DIAGNOSIS — M47812 Spondylosis without myelopathy or radiculopathy, cervical region: Secondary | ICD-10-CM | POA: Diagnosis not present

## 2021-09-16 DIAGNOSIS — M1612 Unilateral primary osteoarthritis, left hip: Secondary | ICD-10-CM | POA: Diagnosis not present

## 2021-09-16 DIAGNOSIS — E1140 Type 2 diabetes mellitus with diabetic neuropathy, unspecified: Secondary | ICD-10-CM | POA: Diagnosis not present

## 2021-09-19 ENCOUNTER — Ambulatory Visit (INDEPENDENT_AMBULATORY_CARE_PROVIDER_SITE_OTHER): Payer: Medicare Other | Admitting: Podiatry

## 2021-09-19 ENCOUNTER — Encounter: Payer: Self-pay | Admitting: Podiatry

## 2021-09-19 DIAGNOSIS — M79674 Pain in right toe(s): Secondary | ICD-10-CM | POA: Diagnosis not present

## 2021-09-19 DIAGNOSIS — M79675 Pain in left toe(s): Secondary | ICD-10-CM

## 2021-09-19 DIAGNOSIS — E0843 Diabetes mellitus due to underlying condition with diabetic autonomic (poly)neuropathy: Secondary | ICD-10-CM

## 2021-09-19 DIAGNOSIS — D689 Coagulation defect, unspecified: Secondary | ICD-10-CM

## 2021-09-19 DIAGNOSIS — B351 Tinea unguium: Secondary | ICD-10-CM

## 2021-09-19 NOTE — Progress Notes (Signed)
This patient returns to my office for at risk foot care.  This patient requires this care by a professional since this patient will be at risk due to having type 2 diabetes and coagulation defect   Patient is taking coumadin. This patient is unable to cut nails herself since the patient cannot reach her nails.These nails are painful walking and wearing shoes.  This patient presents for at risk foot care today.  General Appearance  Alert, conversant and in no acute stress.  Vascular  Dorsalis pedis and posterior tibial  pulses are weakly  palpable  bilaterally.  Capillary return is within normal limits  bilaterally. Temperature is within normal limits  bilaterally.  Neurologic  Senn-Weinstein monofilament wire test diminished   bilaterally. Muscle power within normal limits bilaterally.  Nails Thick disfigured discolored nails with subungual debris  from hallux to fifth toes bilaterally. No evidence of bacterial infection or drainage bilaterally.  Orthopedic  No limitations of motion  feet .  No crepitus or effusions noted.  No bony pathology or digital deformities noted.  Skin  normotropic skin with no porokeratosis noted bilaterally.  No signs of infections or ulcers noted.     Onychomycosis  Pain in right toes  Pain in left toes  Consent was obtained for treatment procedures.   Mechanical debridement of nails 1-5  bilaterally performed with a nail nipper.  Filed with dremel without incident.    Return office visit    12  weeks                  Told patient to return for periodic foot care and evaluation due to potential at risk complications.   Gardiner Barefoot DPM

## 2021-09-21 DIAGNOSIS — M1612 Unilateral primary osteoarthritis, left hip: Secondary | ICD-10-CM | POA: Diagnosis not present

## 2021-09-21 DIAGNOSIS — I10 Essential (primary) hypertension: Secondary | ICD-10-CM | POA: Diagnosis not present

## 2021-09-21 DIAGNOSIS — E1140 Type 2 diabetes mellitus with diabetic neuropathy, unspecified: Secondary | ICD-10-CM | POA: Diagnosis not present

## 2021-09-21 DIAGNOSIS — M21372 Foot drop, left foot: Secondary | ICD-10-CM | POA: Diagnosis not present

## 2021-09-21 DIAGNOSIS — M47812 Spondylosis without myelopathy or radiculopathy, cervical region: Secondary | ICD-10-CM | POA: Diagnosis not present

## 2021-09-21 DIAGNOSIS — M1712 Unilateral primary osteoarthritis, left knee: Secondary | ICD-10-CM | POA: Diagnosis not present

## 2021-09-27 DIAGNOSIS — M1612 Unilateral primary osteoarthritis, left hip: Secondary | ICD-10-CM | POA: Diagnosis not present

## 2021-09-27 DIAGNOSIS — I10 Essential (primary) hypertension: Secondary | ICD-10-CM | POA: Diagnosis not present

## 2021-09-27 DIAGNOSIS — M1712 Unilateral primary osteoarthritis, left knee: Secondary | ICD-10-CM | POA: Diagnosis not present

## 2021-09-27 DIAGNOSIS — M21372 Foot drop, left foot: Secondary | ICD-10-CM | POA: Diagnosis not present

## 2021-09-27 DIAGNOSIS — M47812 Spondylosis without myelopathy or radiculopathy, cervical region: Secondary | ICD-10-CM | POA: Diagnosis not present

## 2021-09-27 DIAGNOSIS — E1140 Type 2 diabetes mellitus with diabetic neuropathy, unspecified: Secondary | ICD-10-CM | POA: Diagnosis not present

## 2021-10-06 ENCOUNTER — Telehealth: Payer: Self-pay

## 2021-10-06 DIAGNOSIS — M1612 Unilateral primary osteoarthritis, left hip: Secondary | ICD-10-CM | POA: Diagnosis not present

## 2021-10-06 DIAGNOSIS — M1712 Unilateral primary osteoarthritis, left knee: Secondary | ICD-10-CM | POA: Diagnosis not present

## 2021-10-06 DIAGNOSIS — E1140 Type 2 diabetes mellitus with diabetic neuropathy, unspecified: Secondary | ICD-10-CM | POA: Diagnosis not present

## 2021-10-06 DIAGNOSIS — M21372 Foot drop, left foot: Secondary | ICD-10-CM | POA: Diagnosis not present

## 2021-10-06 DIAGNOSIS — M47812 Spondylosis without myelopathy or radiculopathy, cervical region: Secondary | ICD-10-CM | POA: Diagnosis not present

## 2021-10-06 DIAGNOSIS — I10 Essential (primary) hypertension: Secondary | ICD-10-CM | POA: Diagnosis not present

## 2021-10-06 NOTE — Telephone Encounter (Signed)
Lm for pt daughter Anita Walker re: need to review pt med list

## 2021-10-13 DIAGNOSIS — M47812 Spondylosis without myelopathy or radiculopathy, cervical region: Secondary | ICD-10-CM | POA: Diagnosis not present

## 2021-10-13 DIAGNOSIS — D649 Anemia, unspecified: Secondary | ICD-10-CM | POA: Diagnosis not present

## 2021-10-13 DIAGNOSIS — H9190 Unspecified hearing loss, unspecified ear: Secondary | ICD-10-CM | POA: Diagnosis not present

## 2021-10-13 DIAGNOSIS — Z86711 Personal history of pulmonary embolism: Secondary | ICD-10-CM | POA: Diagnosis not present

## 2021-10-13 DIAGNOSIS — K219 Gastro-esophageal reflux disease without esophagitis: Secondary | ICD-10-CM | POA: Diagnosis not present

## 2021-10-13 DIAGNOSIS — M1612 Unilateral primary osteoarthritis, left hip: Secondary | ICD-10-CM | POA: Diagnosis not present

## 2021-10-13 DIAGNOSIS — E7800 Pure hypercholesterolemia, unspecified: Secondary | ICD-10-CM | POA: Diagnosis not present

## 2021-10-13 DIAGNOSIS — E1140 Type 2 diabetes mellitus with diabetic neuropathy, unspecified: Secondary | ICD-10-CM | POA: Diagnosis not present

## 2021-10-13 DIAGNOSIS — K259 Gastric ulcer, unspecified as acute or chronic, without hemorrhage or perforation: Secondary | ICD-10-CM | POA: Diagnosis not present

## 2021-10-13 DIAGNOSIS — M1712 Unilateral primary osteoarthritis, left knee: Secondary | ICD-10-CM | POA: Diagnosis not present

## 2021-10-13 DIAGNOSIS — I10 Essential (primary) hypertension: Secondary | ICD-10-CM | POA: Diagnosis not present

## 2021-10-13 DIAGNOSIS — I4891 Unspecified atrial fibrillation: Secondary | ICD-10-CM | POA: Diagnosis not present

## 2021-10-13 DIAGNOSIS — Z853 Personal history of malignant neoplasm of breast: Secondary | ICD-10-CM | POA: Diagnosis not present

## 2021-10-13 DIAGNOSIS — M21372 Foot drop, left foot: Secondary | ICD-10-CM | POA: Diagnosis not present

## 2021-10-14 DIAGNOSIS — I10 Essential (primary) hypertension: Secondary | ICD-10-CM | POA: Diagnosis not present

## 2021-10-14 DIAGNOSIS — M1712 Unilateral primary osteoarthritis, left knee: Secondary | ICD-10-CM | POA: Diagnosis not present

## 2021-10-14 DIAGNOSIS — E1140 Type 2 diabetes mellitus with diabetic neuropathy, unspecified: Secondary | ICD-10-CM | POA: Diagnosis not present

## 2021-10-14 DIAGNOSIS — M1612 Unilateral primary osteoarthritis, left hip: Secondary | ICD-10-CM | POA: Diagnosis not present

## 2021-10-14 DIAGNOSIS — M47812 Spondylosis without myelopathy or radiculopathy, cervical region: Secondary | ICD-10-CM | POA: Diagnosis not present

## 2021-10-14 DIAGNOSIS — M21372 Foot drop, left foot: Secondary | ICD-10-CM | POA: Diagnosis not present

## 2021-10-21 DIAGNOSIS — L249 Irritant contact dermatitis, unspecified cause: Secondary | ICD-10-CM | POA: Diagnosis not present

## 2021-10-21 DIAGNOSIS — L570 Actinic keratosis: Secondary | ICD-10-CM | POA: Diagnosis not present

## 2021-10-25 ENCOUNTER — Ambulatory Visit: Payer: Medicare Other | Admitting: Internal Medicine

## 2021-10-25 DIAGNOSIS — M1712 Unilateral primary osteoarthritis, left knee: Secondary | ICD-10-CM | POA: Diagnosis not present

## 2021-10-25 DIAGNOSIS — M47812 Spondylosis without myelopathy or radiculopathy, cervical region: Secondary | ICD-10-CM | POA: Diagnosis not present

## 2021-10-25 DIAGNOSIS — E1140 Type 2 diabetes mellitus with diabetic neuropathy, unspecified: Secondary | ICD-10-CM | POA: Diagnosis not present

## 2021-10-25 DIAGNOSIS — M21372 Foot drop, left foot: Secondary | ICD-10-CM | POA: Diagnosis not present

## 2021-10-25 DIAGNOSIS — I10 Essential (primary) hypertension: Secondary | ICD-10-CM | POA: Diagnosis not present

## 2021-10-25 DIAGNOSIS — M1612 Unilateral primary osteoarthritis, left hip: Secondary | ICD-10-CM | POA: Diagnosis not present

## 2021-10-26 ENCOUNTER — Other Ambulatory Visit (INDEPENDENT_AMBULATORY_CARE_PROVIDER_SITE_OTHER): Payer: Medicare Other

## 2021-10-26 DIAGNOSIS — E7800 Pure hypercholesterolemia, unspecified: Secondary | ICD-10-CM

## 2021-10-26 DIAGNOSIS — D649 Anemia, unspecified: Secondary | ICD-10-CM | POA: Diagnosis not present

## 2021-10-26 DIAGNOSIS — E1149 Type 2 diabetes mellitus with other diabetic neurological complication: Secondary | ICD-10-CM

## 2021-10-26 LAB — FERRITIN: Ferritin: 72.8 ng/mL (ref 10.0–291.0)

## 2021-10-26 LAB — CBC WITH DIFFERENTIAL/PLATELET
Basophils Absolute: 0 10*3/uL (ref 0.0–0.1)
Basophils Relative: 0.8 % (ref 0.0–3.0)
Eosinophils Absolute: 0.1 10*3/uL (ref 0.0–0.7)
Eosinophils Relative: 1.8 % (ref 0.0–5.0)
HCT: 41 % (ref 36.0–46.0)
Hemoglobin: 13.7 g/dL (ref 12.0–15.0)
Lymphocytes Relative: 27.8 % (ref 12.0–46.0)
Lymphs Abs: 1.6 10*3/uL (ref 0.7–4.0)
MCHC: 33.5 g/dL (ref 30.0–36.0)
MCV: 89.2 fl (ref 78.0–100.0)
Monocytes Absolute: 0.9 10*3/uL (ref 0.1–1.0)
Monocytes Relative: 16.7 % — ABNORMAL HIGH (ref 3.0–12.0)
Neutro Abs: 3 10*3/uL (ref 1.4–7.7)
Neutrophils Relative %: 52.9 % (ref 43.0–77.0)
Platelets: 192 10*3/uL (ref 150.0–400.0)
RBC: 4.59 Mil/uL (ref 3.87–5.11)
RDW: 14.5 % (ref 11.5–15.5)
WBC: 5.7 10*3/uL (ref 4.0–10.5)

## 2021-10-26 LAB — LIPID PANEL
Cholesterol: 148 mg/dL (ref 0–200)
HDL: 58.1 mg/dL (ref >39.00)
LDL Cholesterol: 71 mg/dL (ref 0–99)
NonHDL: 89.83
Total CHOL/HDL Ratio: 3
Triglycerides: 92 mg/dL (ref 0.0–149.0)
VLDL: 18.4 mg/dL (ref 0.0–40.0)

## 2021-10-26 LAB — BASIC METABOLIC PANEL
BUN: 16 mg/dL (ref 6–23)
CO2: 25 mEq/L (ref 19–32)
Calcium: 9.8 mg/dL (ref 8.4–10.5)
Chloride: 101 mEq/L (ref 96–112)
Creatinine, Ser: 0.82 mg/dL (ref 0.40–1.20)
GFR: 66.06 mL/min (ref >60.00)
Glucose, Bld: 107 mg/dL — ABNORMAL HIGH (ref 70–99)
Potassium: 4.5 mEq/L (ref 3.5–5.1)
Sodium: 139 mEq/L (ref 135–145)

## 2021-10-26 LAB — HEPATIC FUNCTION PANEL
ALT: 11 U/L (ref 0–35)
AST: 15 U/L (ref 0–37)
Albumin: 4.2 g/dL (ref 3.5–5.2)
Alkaline Phosphatase: 40 U/L (ref 39–117)
Bilirubin, Direct: 0.1 mg/dL (ref 0.0–0.3)
Total Bilirubin: 0.7 mg/dL (ref 0.2–1.2)
Total Protein: 6.6 g/dL (ref 6.0–8.3)

## 2021-10-26 LAB — HEMOGLOBIN A1C: Hgb A1c MFr Bld: 5.9 % (ref 4.6–6.5)

## 2021-10-26 LAB — MICROALBUMIN / CREATININE URINE RATIO
Creatinine,U: 89.8 mg/dL
Microalb Creat Ratio: 1 mg/g (ref 0.0–30.0)
Microalb, Ur: 0.9 mg/dL (ref 0.0–1.9)

## 2021-10-27 ENCOUNTER — Other Ambulatory Visit: Payer: Medicare Other

## 2021-10-31 ENCOUNTER — Encounter: Payer: Self-pay | Admitting: Internal Medicine

## 2021-10-31 ENCOUNTER — Ambulatory Visit (INDEPENDENT_AMBULATORY_CARE_PROVIDER_SITE_OTHER): Payer: Medicare Other | Admitting: Internal Medicine

## 2021-10-31 VITALS — BP 138/80 | HR 83 | Temp 97.5°F | Ht 68.0 in | Wt 248.0 lb

## 2021-10-31 DIAGNOSIS — E1149 Type 2 diabetes mellitus with other diabetic neurological complication: Secondary | ICD-10-CM | POA: Diagnosis not present

## 2021-10-31 DIAGNOSIS — E7800 Pure hypercholesterolemia, unspecified: Secondary | ICD-10-CM

## 2021-10-31 DIAGNOSIS — Z853 Personal history of malignant neoplasm of breast: Secondary | ICD-10-CM | POA: Diagnosis not present

## 2021-10-31 DIAGNOSIS — I10 Essential (primary) hypertension: Secondary | ICD-10-CM

## 2021-10-31 DIAGNOSIS — G319 Degenerative disease of nervous system, unspecified: Secondary | ICD-10-CM

## 2021-10-31 DIAGNOSIS — E1142 Type 2 diabetes mellitus with diabetic polyneuropathy: Secondary | ICD-10-CM | POA: Diagnosis not present

## 2021-10-31 DIAGNOSIS — D649 Anemia, unspecified: Secondary | ICD-10-CM | POA: Diagnosis not present

## 2021-10-31 DIAGNOSIS — Z0000 Encounter for general adult medical examination without abnormal findings: Secondary | ICD-10-CM

## 2021-10-31 NOTE — Progress Notes (Signed)
[name], female   DOB: [birth year], 81 y.o.   MRN: [id number]   Subjective:    [name], female    DOB: 14-Oct-1940, 81 y.o.   MRN: [id number]  .   HPI With history of diabetes, hypertension and hypercholesterolemia.  She comes in today to follow up on these issues as well as for a complete physical exam.  Declined to get undressed. She is accompanied by her caretaker.  History obtained from both of them.  She is walking.  No chest pain or sob reported.  Feels breathing is stable.  No cough or congestion.  No acid reflux.  No abdominal pain.  Bowels moving relatively well.    Past Medical History:  Diagnosis Date   Anemia    Arthritis    Atrial fibrillation ([place])    Breast cancer ([place])    s/p lumpectomy 1992.  s/p chemo and xrt left breast   Diabetes mellitus ([place])    Edema    feet/legs   Gastric ulcer    GERD (gastroesophageal reflux disease)    HOH (hard of hearing)    aides   Hypercholesterolemia    Hypertension    Personal history of chemotherapy    Pulmonary emboli [facility])    Past Surgical History:  Procedure Laterality Date   BREAST LUMPECTOMY  03/06/1990   left breast   CATARACT EXTRACTION W/PHACO Right 12/26/2016   Procedure: CATARACT EXTRACTION PHACO AND INTRAOCULAR LENS PLACEMENT (IOC)-RIGHT DIABETIC;  Surgeon: Birder Robson, MD;  Location: [facility] ORS;  Service: Ophthalmology;  Laterality: Right;  [place] 00:47 AP% 24.5 CDE 11.62 Fluid pack lot # [id number]   CATARACT EXTRACTION W/PHACO Left 01/23/2017   Procedure: CATARACT EXTRACTION PHACO AND INTRAOCULAR LENS PLACEMENT (IOC);  Surgeon: Birder Robson, MD;  Location: [facility] ORS;  Service: Ophthalmology;  Laterality: Left;  [place] 00:50 AP% 16.1 CDE 8.18 Fluid pack lot #[id number]   COLONOSCOPY WITH PROPOFOL N/A 07/08/2019   Procedure: COLONOSCOPY WITH PROPOFOL;  Surgeon: Lucilla Lame, MD;  Location[facility] ENDOSCOPY;  Service: Endoscopy;  Laterality: N/A;   Family History  Problem Relation  Age of Onset   Heart disease Father    Heart disease Brother        s/p CABG   Breast cancer Daughter    Colon cancer Neg Hx    Social History   Socioeconomic History   Marital status: Widowed    Spouse name: Not on file   Number of children: Not on file   Years of education: Not on file   Highest education level: Not on file  Occupational History   Not on file  Tobacco Use   Smoking status: Never   Smokeless tobacco: Never  Vaping Use   Vaping Use: Never used  Substance and Sexual Activity   Alcohol use: No    Alcohol/week: 0.0 standard drinks of alcohol   Drug use: No   Sexual activity: Never  Other Topics Concern   Not on file  Social History Narrative   Not on file   Social Determinants of Health   Financial Resource Strain: Low Risk  (04/27/2021)   Overall Financial Resource Strain (CARDIA)    Difficulty of Paying Living Expenses: Not hard at all  Food Insecurity: No Food Insecurity (04/27/2021)   Hunger Vital Sign    Worried About Running Out of Food in the Last Year: Never true    [place] in the Last Year: Never true  Transportation  Needs: No Transportation Needs (04/27/2021)   PRAPARE - Hydrologist (Medical): No    Lack of Transportation (Non-Medical): No  Physical Activity: Insufficiently Active (04/27/2021)   Exercise Vital Sign    Days of Exercise per Week: 7 days    Minutes of Exercise per Session: 20 min  Stress: No Stress Concern Present (04/27/2021)   [place]    Feeling of Stress : Not at all  Social Connections: Unknown (04/27/2021)   Social Connection and Isolation Panel [NHANES]    Frequency of Communication with Friends and Family: More than three times a week    Frequency of Social Gatherings with Friends and Family: More than three times a week    Attends Religious Services: Not on Advertising copywriter or Organizations: Not on file     Attends Archivist Meetings: Not on file    Marital Status: Not on file     Review of Systems  Constitutional:  Negative for appetite change and unexpected weight change.  HENT:  Negative for congestion, sinus pressure and sore throat.   Eyes:  Negative for pain and visual disturbance.  Respiratory:  Negative for cough, chest tightness and shortness of breath.   Cardiovascular:  Negative for chest pain, palpitations and leg swelling.  Gastrointestinal:  Negative for abdominal pain, diarrhea, nausea and vomiting.  Genitourinary:  Negative for difficulty urinating and dysuria.  Musculoskeletal:  Negative for joint swelling and myalgias.  Skin:  Negative for color change and rash.  Neurological:  Negative for dizziness, light-headedness and headaches.  Hematological:  Negative for adenopathy. Does not bruise/bleed easily.  Psychiatric/Behavioral:  Negative for agitation and dysphoric mood.        Objective:     BP 138/80 (BP Location: Right Arm, Patient Position: Sitting, Cuff Size: Large)   Pulse 83   Temp (!) 97.5 F (36.4 C) (Oral)   Ht '5\' 8"'  (1.727 m)   Wt 248 lb (112.5 kg)   SpO2 94%   BMI 37.71 kg/m  Wt Readings from Last 3 Encounters:  10/31/21 248 lb (112.5 kg)  07/08/21 252 lb (114.3 kg)  06/22/21 249 lb 9.6 oz (113.2 kg)    Physical Exam Vitals reviewed.  Constitutional:      General: She is not in acute distress.    Appearance: Normal appearance.  HENT:     Head: Normocephalic and atraumatic.     Right Ear: External ear normal.     Left Ear: External ear normal.  Eyes:     General: No scleral icterus.       Right eye: No discharge.        Left eye: No discharge.     Conjunctiva/sclera: Conjunctivae normal.  Neck:     Thyroid: No thyromegaly.  Cardiovascular:     Rate and Rhythm: Normal rate and regular rhythm.  Pulmonary:     Effort: No respiratory distress.     Breath sounds: Normal breath sounds. No wheezing.  Abdominal:      General: Bowel sounds are normal.     Palpations: Abdomen is soft.     Tenderness: There is no abdominal tenderness.  Musculoskeletal:        General: No swelling or tenderness.     Cervical back: Neck supple. No tenderness.  Lymphadenopathy:     Cervical: No cervical adenopathy.  Skin:    Findings: No erythema or rash.  Neurological:     Mental Status: She is alert.  Psychiatric:        Mood and Affect: Mood normal.        Behavior: Behavior normal.      Outpatient Encounter Medications as of 10/31/2021  Medication Sig   amLODipine (NORVASC) 2.5 MG tablet TAKE 1 TABLET(2.5 MG) BY MOUTH DAILY   diphenhydrAMINE (BENADRYL) 25 mg capsule Take 25 mg at bedtime as needed by mouth for sleep.    feeding supplement (ENSURE ENLIVE / ENSURE PLUS) LIQD Take 237 mLs by mouth 2 (two) times daily between meals.   FEROSUL 325 (65 Fe) MG tablet TAKE 1 TABLET(325 MG) BY MOUTH DAILY   glucose blood (CONTOUR TEST) test strip USE TO TEST BLOOD SUGAR ONCE EVERY WEEK   losartan (COZAAR) 100 MG tablet TAKE 1 TABLET(100 MG) BY MOUTH DAILY   Multiple Vitamin (MULTIVITAMIN WITH MINERALS) TABS tablet Take 1 tablet daily by mouth.   pantoprazole (PROTONIX) 40 MG tablet TAKE 1 TABLET(40 MG) BY MOUTH DAILY   simvastatin (ZOCOR) 10 MG tablet TAKE 1 TABLET BY MOUTH EVERY DAY   No facility-administered encounter medications on file as of 10/31/2021.     Lab Results  Component Value Date   WBC 5.7 10/26/2021   HGB 13.7 10/26/2021   HCT 41.0 10/26/2021   PLT 192.0 10/26/2021   GLUCOSE 107 (H) 10/26/2021   CHOL 148 10/26/2021   TRIG 92.0 10/26/2021   HDL 58.10 10/26/2021   LDLCALC 71 10/26/2021   ALT 11 10/26/2021   AST 15 10/26/2021   NA 139 10/26/2021   K 4.5 10/26/2021   CL 101 10/26/2021   CREATININE 0.82 10/26/2021   BUN 16 10/26/2021   CO2 25 10/26/2021   TSH 3.53 03/16/2021   INR 1.2 (H) 07/22/2020   HGBA1C 5.9 10/26/2021   MICROALBUR 0.9 10/26/2021    US Venous Img Lower Unilateral  Left  Result Date: 07/08/2021 CLINICAL DATA:  Left leg pain and swelling EXAM: LEFT LOWER EXTREMITY VENOUS DOPPLER ULTRASOUND TECHNIQUE: Gray-scale sonography with compression, as well as color and duplex ultrasound, were performed to evaluate the deep venous system(s) from the level of the common femoral vein through the popliteal and proximal calf veins. COMPARISON:  09/26/2015. FINDINGS: VENOUS Normal compressibility of the common femoral, superficial femoral, and popliteal veins, as well as the visualized calf veins. Visualized portions of profunda femoral vein and great saphenous vein unremarkable. No filling defects to suggest DVT on grayscale or color Doppler imaging. Doppler waveforms show normal direction of venous flow, normal respiratory plasticity and response to augmentation. Limited views of the contralateral common femoral vein are unremarkable. OTHER None. Limitations: none IMPRESSION: Negative. Electronically Signed   By: Davina Poke D.O.   On: 07/08/2021 15:49       Assessment & Plan:   Problem List Items Addressed This Visit     Anemia    Recent hgb wnl.  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  Mild.  Stable.       Diabetic polyneuropathy associated with type 2 diabetes mellitus ([place])    Sugars have been well controlled.  Continue low carb diet and exercise.  Continue to use walker for ambulation.  Follow met b and a1c.       Health care maintenance    Physical today 10/31/21 - did not  want to get undressed. Mammogram 12/27/20 - Birads I.  Colonoscopy 07/2019 - tubular adenoma (x3). Diverticulosis in the sigmoid colon. Two 4 to 5 mm polyps in the descending colon, removed with a cold snare. Resected and retrieved. One 3 mm polyp in the cecum, removed with a cold snare. Resected and retrieved. Non-bleeding internal hemorrhoids      History of breast cancer     Mammogram 12/29/20 - birads I.       Hypercholesteremia    On simvastatin.  Low cholesterol diet and exercise.  Follow lipid panel and liver function tests.        Hypertension - Primary    Continue losartan and amlodipine.  Follow pressures.       Relevant Orders   Basic metabolic panel   Pure hypercholesterolemia   Relevant Orders   Hepatic function panel   Lipid panel   TSH   Type 2 diabetes mellitus with neurological complications (HCC)    Low carb diet and exercise as tolerated.  Follow met b and a1c.       Relevant Orders   Hemoglobin A1c     Einar Pheasant, MD

## 2021-11-03 ENCOUNTER — Telehealth: Payer: Self-pay

## 2021-11-03 NOTE — Telephone Encounter (Signed)
LM for daughter to call back to confirm what exactly patient is taking. She was in office Monday & was with caregiver. She confirmed meds but they are still wrong on plan of care.

## 2021-11-04 DIAGNOSIS — M545 Low back pain, unspecified: Secondary | ICD-10-CM | POA: Diagnosis not present

## 2021-11-04 DIAGNOSIS — M47896 Other spondylosis, lumbar region: Secondary | ICD-10-CM | POA: Diagnosis not present

## 2021-11-07 ENCOUNTER — Encounter: Payer: Self-pay | Admitting: Internal Medicine

## 2021-11-07 NOTE — Assessment & Plan Note (Signed)
Recent hgb wnl.  Follow cbc and ferritin

## 2021-11-07 NOTE — Assessment & Plan Note (Signed)
Sugars have been well controlled.  Continue low carb diet and exercise.  Continue to use walker for ambulation.  Follow met b and a1c.  

## 2021-11-07 NOTE — Assessment & Plan Note (Signed)
Mammogram 12/29/20 - birads I.

## 2021-11-07 NOTE — Assessment & Plan Note (Signed)
On simvastatin.  Low cholesterol diet and exercise.  Follow lipid panel and liver function tests.   

## 2021-11-07 NOTE — Assessment & Plan Note (Signed)
Continue losartan and amlodipine.  Follow pressures.  

## 2021-11-07 NOTE — Assessment & Plan Note (Signed)
Found on previous CT head.  Mild.  Stable.  

## 2021-11-07 NOTE — Assessment & Plan Note (Addendum)
Physical today 10/31/21 - did not want to get undressed. Mammogram 12/27/20 - Birads I.  Colonoscopy 07/2019 - tubular adenoma (x3). Diverticulosis in the sigmoid colon. Two 4 to 5 mm polyps in the descending colon, removed with a cold snare. Resected and retrieved. One 3 mm polyp in the cecum, removed with a cold snare. Resected and retrieved. Non-bleeding internal hemorrhoids

## 2021-11-07 NOTE — Assessment & Plan Note (Signed)
Low carb diet and exercise as tolerated.  Follow met b and a1c.  

## 2021-11-09 DIAGNOSIS — I10 Essential (primary) hypertension: Secondary | ICD-10-CM | POA: Diagnosis not present

## 2021-11-09 DIAGNOSIS — M1712 Unilateral primary osteoarthritis, left knee: Secondary | ICD-10-CM | POA: Diagnosis not present

## 2021-11-09 DIAGNOSIS — M47812 Spondylosis without myelopathy or radiculopathy, cervical region: Secondary | ICD-10-CM | POA: Diagnosis not present

## 2021-11-09 DIAGNOSIS — E1140 Type 2 diabetes mellitus with diabetic neuropathy, unspecified: Secondary | ICD-10-CM | POA: Diagnosis not present

## 2021-11-09 DIAGNOSIS — M1612 Unilateral primary osteoarthritis, left hip: Secondary | ICD-10-CM | POA: Diagnosis not present

## 2021-11-09 DIAGNOSIS — M21372 Foot drop, left foot: Secondary | ICD-10-CM | POA: Diagnosis not present

## 2021-11-16 DIAGNOSIS — M545 Low back pain, unspecified: Secondary | ICD-10-CM | POA: Diagnosis not present

## 2021-11-20 DIAGNOSIS — Z86718 Personal history of other venous thrombosis and embolism: Secondary | ICD-10-CM | POA: Diagnosis not present

## 2021-11-20 DIAGNOSIS — E7800 Pure hypercholesterolemia, unspecified: Secondary | ICD-10-CM | POA: Diagnosis not present

## 2021-11-20 DIAGNOSIS — E119 Type 2 diabetes mellitus without complications: Secondary | ICD-10-CM | POA: Diagnosis not present

## 2021-11-20 DIAGNOSIS — I10 Essential (primary) hypertension: Secondary | ICD-10-CM | POA: Diagnosis not present

## 2021-11-20 DIAGNOSIS — K219 Gastro-esophageal reflux disease without esophagitis: Secondary | ICD-10-CM | POA: Diagnosis not present

## 2021-11-20 DIAGNOSIS — G8929 Other chronic pain: Secondary | ICD-10-CM | POA: Diagnosis not present

## 2021-11-20 DIAGNOSIS — M4125 Other idiopathic scoliosis, thoracolumbar region: Secondary | ICD-10-CM | POA: Diagnosis not present

## 2021-11-20 DIAGNOSIS — Z8673 Personal history of transient ischemic attack (TIA), and cerebral infarction without residual deficits: Secondary | ICD-10-CM | POA: Diagnosis not present

## 2021-11-20 DIAGNOSIS — R32 Unspecified urinary incontinence: Secondary | ICD-10-CM | POA: Diagnosis not present

## 2021-11-20 DIAGNOSIS — M8008XD Age-related osteoporosis with current pathological fracture, vertebra(e), subsequent encounter for fracture with routine healing: Secondary | ICD-10-CM | POA: Diagnosis not present

## 2021-11-20 DIAGNOSIS — Z853 Personal history of malignant neoplasm of breast: Secondary | ICD-10-CM | POA: Diagnosis not present

## 2021-11-20 DIAGNOSIS — M47816 Spondylosis without myelopathy or radiculopathy, lumbar region: Secondary | ICD-10-CM | POA: Diagnosis not present

## 2021-11-20 DIAGNOSIS — E669 Obesity, unspecified: Secondary | ICD-10-CM | POA: Diagnosis not present

## 2021-11-20 DIAGNOSIS — Z9181 History of falling: Secondary | ICD-10-CM | POA: Diagnosis not present

## 2021-11-20 DIAGNOSIS — H9190 Unspecified hearing loss, unspecified ear: Secondary | ICD-10-CM | POA: Diagnosis not present

## 2021-11-20 DIAGNOSIS — Z6836 Body mass index (BMI) 36.0-36.9, adult: Secondary | ICD-10-CM | POA: Diagnosis not present

## 2021-11-24 DIAGNOSIS — M8008XD Age-related osteoporosis with current pathological fracture, vertebra(e), subsequent encounter for fracture with routine healing: Secondary | ICD-10-CM | POA: Diagnosis not present

## 2021-11-24 DIAGNOSIS — G8929 Other chronic pain: Secondary | ICD-10-CM | POA: Diagnosis not present

## 2021-11-24 DIAGNOSIS — M4125 Other idiopathic scoliosis, thoracolumbar region: Secondary | ICD-10-CM | POA: Diagnosis not present

## 2021-11-24 DIAGNOSIS — M47816 Spondylosis without myelopathy or radiculopathy, lumbar region: Secondary | ICD-10-CM | POA: Diagnosis not present

## 2021-11-24 DIAGNOSIS — I10 Essential (primary) hypertension: Secondary | ICD-10-CM | POA: Diagnosis not present

## 2021-11-24 DIAGNOSIS — E119 Type 2 diabetes mellitus without complications: Secondary | ICD-10-CM | POA: Diagnosis not present

## 2021-11-28 DIAGNOSIS — M4125 Other idiopathic scoliosis, thoracolumbar region: Secondary | ICD-10-CM | POA: Diagnosis not present

## 2021-11-28 DIAGNOSIS — I10 Essential (primary) hypertension: Secondary | ICD-10-CM | POA: Diagnosis not present

## 2021-11-28 DIAGNOSIS — E119 Type 2 diabetes mellitus without complications: Secondary | ICD-10-CM | POA: Diagnosis not present

## 2021-11-28 DIAGNOSIS — G8929 Other chronic pain: Secondary | ICD-10-CM | POA: Diagnosis not present

## 2021-11-28 DIAGNOSIS — M47816 Spondylosis without myelopathy or radiculopathy, lumbar region: Secondary | ICD-10-CM | POA: Diagnosis not present

## 2021-11-28 DIAGNOSIS — M8008XD Age-related osteoporosis with current pathological fracture, vertebra(e), subsequent encounter for fracture with routine healing: Secondary | ICD-10-CM | POA: Diagnosis not present

## 2021-11-30 DIAGNOSIS — G8929 Other chronic pain: Secondary | ICD-10-CM | POA: Diagnosis not present

## 2021-11-30 DIAGNOSIS — M4125 Other idiopathic scoliosis, thoracolumbar region: Secondary | ICD-10-CM | POA: Diagnosis not present

## 2021-11-30 DIAGNOSIS — M8008XD Age-related osteoporosis with current pathological fracture, vertebra(e), subsequent encounter for fracture with routine healing: Secondary | ICD-10-CM | POA: Diagnosis not present

## 2021-11-30 DIAGNOSIS — M47816 Spondylosis without myelopathy or radiculopathy, lumbar region: Secondary | ICD-10-CM | POA: Diagnosis not present

## 2021-11-30 DIAGNOSIS — I10 Essential (primary) hypertension: Secondary | ICD-10-CM | POA: Diagnosis not present

## 2021-11-30 DIAGNOSIS — E119 Type 2 diabetes mellitus without complications: Secondary | ICD-10-CM | POA: Diagnosis not present

## 2021-12-07 DIAGNOSIS — E119 Type 2 diabetes mellitus without complications: Secondary | ICD-10-CM | POA: Diagnosis not present

## 2021-12-07 DIAGNOSIS — M47816 Spondylosis without myelopathy or radiculopathy, lumbar region: Secondary | ICD-10-CM | POA: Diagnosis not present

## 2021-12-07 DIAGNOSIS — I10 Essential (primary) hypertension: Secondary | ICD-10-CM | POA: Diagnosis not present

## 2021-12-07 DIAGNOSIS — G8929 Other chronic pain: Secondary | ICD-10-CM | POA: Diagnosis not present

## 2021-12-07 DIAGNOSIS — M4125 Other idiopathic scoliosis, thoracolumbar region: Secondary | ICD-10-CM | POA: Diagnosis not present

## 2021-12-07 DIAGNOSIS — M8008XD Age-related osteoporosis with current pathological fracture, vertebra(e), subsequent encounter for fracture with routine healing: Secondary | ICD-10-CM | POA: Diagnosis not present

## 2021-12-08 DIAGNOSIS — R300 Dysuria: Secondary | ICD-10-CM | POA: Diagnosis not present

## 2021-12-08 DIAGNOSIS — Z6837 Body mass index (BMI) 37.0-37.9, adult: Secondary | ICD-10-CM | POA: Diagnosis not present

## 2021-12-08 DIAGNOSIS — N3000 Acute cystitis without hematuria: Secondary | ICD-10-CM | POA: Diagnosis not present

## 2021-12-08 DIAGNOSIS — R051 Acute cough: Secondary | ICD-10-CM | POA: Diagnosis not present

## 2021-12-08 DIAGNOSIS — R39198 Other difficulties with micturition: Secondary | ICD-10-CM | POA: Diagnosis not present

## 2021-12-12 DIAGNOSIS — M47816 Spondylosis without myelopathy or radiculopathy, lumbar region: Secondary | ICD-10-CM | POA: Diagnosis not present

## 2021-12-12 DIAGNOSIS — M4125 Other idiopathic scoliosis, thoracolumbar region: Secondary | ICD-10-CM | POA: Diagnosis not present

## 2021-12-12 DIAGNOSIS — I10 Essential (primary) hypertension: Secondary | ICD-10-CM | POA: Diagnosis not present

## 2021-12-12 DIAGNOSIS — M8008XD Age-related osteoporosis with current pathological fracture, vertebra(e), subsequent encounter for fracture with routine healing: Secondary | ICD-10-CM | POA: Diagnosis not present

## 2021-12-12 DIAGNOSIS — E119 Type 2 diabetes mellitus without complications: Secondary | ICD-10-CM | POA: Diagnosis not present

## 2021-12-12 DIAGNOSIS — G8929 Other chronic pain: Secondary | ICD-10-CM | POA: Diagnosis not present

## 2021-12-14 ENCOUNTER — Ambulatory Visit (INDEPENDENT_AMBULATORY_CARE_PROVIDER_SITE_OTHER): Payer: Medicare Other | Admitting: Family

## 2021-12-14 ENCOUNTER — Encounter: Payer: Self-pay | Admitting: Family

## 2021-12-14 VITALS — BP 132/82 | HR 84 | Ht 65.0 in | Wt 245.2 lb

## 2021-12-14 DIAGNOSIS — R8290 Unspecified abnormal findings in urine: Secondary | ICD-10-CM

## 2021-12-14 DIAGNOSIS — J40 Bronchitis, not specified as acute or chronic: Secondary | ICD-10-CM

## 2021-12-14 LAB — URINALYSIS, ROUTINE W REFLEX MICROSCOPIC
Bilirubin Urine: NEGATIVE
Hgb urine dipstick: NEGATIVE
Ketones, ur: NEGATIVE
Leukocytes,Ua: NEGATIVE
Nitrite: NEGATIVE
Specific Gravity, Urine: <=1.005 — AB (ref 1.000–1.030)
Total Protein, Urine: NEGATIVE
Urine Glucose: NEGATIVE
Urobilinogen, UA: 0.2 (ref 0.0–1.0)
pH: 6.5 (ref 5.0–8.0)

## 2021-12-14 MED ORDER — DOXYCYCLINE HYCLATE 100 MG PO TABS: 100.0000 mg | ORAL_TABLET | Freq: Two times a day (BID) | ORAL | 0 refills | Status: AC

## 2021-12-14 NOTE — Patient Instructions (Signed)
Chest x-ray urgent care did not reveal pneumonia.  Urine studies at  urgent care also did not reveal urinary tract infection.    I am glad that you are feeling better on the doxycycline, if needed I have prescribed 5 more days of doxycycline (antibiotic) for you to take.    However as discussed, if you feel cough continues to get better and you do not require doxycycline for 5 days, I do not think you need to take.  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  Please continue Robitussin and Mucinex.   Nice to meet you.

## 2021-12-14 NOTE — Progress Notes (Signed)
Subjective:    [name], female    DOB: 02-21-1941, 81 y.o.   MRN: [id number]  CC: Anita Walker is a 81 y.o. female who presents today for an acute visit.    HPI: Seen 6 days ago at [facility] urgent care at [facility] for dysuria, cough Accompanied by caregiver Productive cough x 2 weeks, improved.   She still has thick deep congestion in her chest.     She has tried robitussin, mucinex with some relief.  No fever, chills, orthopnea wheezing, sob, leg swelling , dysuria, urinary frequency.   She been taking doxycycline  and has 2 more days left.  She eats yogurt daily  Urinalysis trace blood, trace leukocytes.  Urine culture shows mixed urogenital flora.  She was not tested for COVID or flu Chest x-ray obtained lungs are clear, no pleural effusion or pneumothorax History of hypertension, diabetes   History of DVT and bilateral pulmonary emboli.  she is not on Coumadin at this time.  Coumadin stopped 07/02/2020 per Dr. Nicki Reaper given multiple falls and no clot > 10 years.   HISTORY:  Past Medical History:  Diagnosis Date   Anemia    Arthritis    Atrial fibrillation ([place])    Breast cancer ([place])    s/p lumpectomy 1992.  s/p chemo and xrt left breast   Diabetes mellitus ([place])    Edema    feet/legs   Gastric ulcer    GERD (gastroesophageal reflux disease)    HOH (hard of hearing)    aides   Hypercholesterolemia    Hypertension    Personal history of chemotherapy    Pulmonary emboli [facility])    Past Surgical History:  Procedure Laterality Date   BREAST LUMPECTOMY  03/06/1990   left breast   CATARACT EXTRACTION W/PHACO Right 12/26/2016   Procedure: CATARACT EXTRACTION PHACO AND INTRAOCULAR LENS PLACEMENT (IOC)-RIGHT DIABETIC;  Surgeon: Birder Robson, MD;  Location: [facility] ORS;  Service: Ophthalmology;  Laterality: Right;  [place] 00:47 AP% 24.5 CDE 11.62 Fluid pack lot # [id number]   CATARACT EXTRACTION W/PHACO Left 01/23/2017   Procedure: CATARACT EXTRACTION  PHACO AND INTRAOCULAR LENS PLACEMENT (IOC);  Surgeon: Birder Robson, MD;  Location: [facility] ORS;  Service: Ophthalmology;  Laterality: Left;  [place] 00:50 AP% 16.1 CDE 8.18 Fluid pack lot #[id number]   COLONOSCOPY WITH PROPOFOL N/A 07/08/2019   Procedure: COLONOSCOPY WITH PROPOFOL;  Surgeon: Lucilla Lame, MD;  Location: [facility] ENDOSCOPY;  Service: Endoscopy;  Laterality: N/A;   Family History  Problem Relation Age of Onset   Heart disease Father    Heart disease Brother        s/p CABG   Breast cancer Daughter    Colon cancer Neg Hx     Allergies: Penicillins and Penicillin v potassium Current Outpatient Medications on File Prior to Visit  Medication Sig Dispense Refill   amLODipine (NORVASC) 2.5 MG tablet TAKE 1 TABLET(2.5 MG) BY MOUTH DAILY 90 tablet 1   diphenhydrAMINE (BENADRYL) 25 mg capsule Take 25 mg at bedtime as needed by mouth for sleep.      feeding supplement (ENSURE ENLIVE / ENSURE PLUS) LIQD Take 237 mLs by mouth 2 (two) times daily between meals. 237 mL 12   FEROSUL 325 (65 Fe) MG tablet TAKE 1 TABLET(325 MG) BY MOUTH DAILY 90 tablet 2   glucose blood (CONTOUR TEST) test strip USE TO TEST BLOOD SUGAR ONCE EVERY WEEK 25 strip 1   losartan (COZAAR) 100 MG tablet TAKE  1 TABLET(100 MG) BY MOUTH DAILY 90 tablet 3   Multiple Vitamin (MULTIVITAMIN WITH MINERALS) TABS tablet Take 1 tablet daily by mouth.     pantoprazole (PROTONIX) 40 MG tablet TAKE 1 TABLET(40 MG) BY MOUTH DAILY 90 tablet 1   simvastatin (ZOCOR) 10 MG tablet TAKE 1 TABLET BY MOUTH EVERY DAY 90 tablet 1   No current facility-administered medications on file prior to visit.    Social History   Tobacco Use   Smoking status: Never   Smokeless tobacco: Never  Vaping Use   Vaping Use: Never used  Substance Use Topics   Alcohol use: No    Alcohol/week: 0.0 standard drinks of alcohol   Drug use: No    Review of Systems  Constitutional:  Negative for chills and fever.  HENT:  Positive for congestion. Negative for  ear pain and sinus pressure.   Respiratory:  Positive for cough. Negative for shortness of breath.   Cardiovascular:  Negative for chest pain, palpitations and leg swelling.  Gastrointestinal:  Negative for nausea and vomiting.      Objective:    BP 132/82   Pulse 84   Ht '5\' 5"'$  (1.651 m)   Wt 245 lb 3.2 oz (111.2 kg)   SpO2 97%   BMI 40.80 kg/m    Physical Exam Vitals reviewed.  Constitutional:      Appearance: She is well-developed.  HENT:     Head: Normocephalic and atraumatic.     Right Ear: Hearing, tympanic membrane, ear canal and external ear normal. No decreased hearing noted. No drainage, swelling or tenderness. No middle ear effusion. No foreign body. Tympanic membrane is not erythematous or bulging.     Left Ear: Hearing, tympanic membrane, ear canal and external ear normal. No decreased hearing noted. No drainage, swelling or tenderness.  No middle ear effusion. No foreign body. Tympanic membrane is not erythematous or bulging.     Nose: Nose normal. No rhinorrhea.     Right Sinus: No maxillary sinus tenderness or frontal sinus tenderness.     Left Sinus: No maxillary sinus tenderness or frontal sinus tenderness.     Mouth/Throat:     Pharynx: Uvula midline. No oropharyngeal exudate or posterior oropharyngeal erythema.     Tonsils: No tonsillar abscesses.  Eyes:     Conjunctiva/sclera: Conjunctivae normal.  Cardiovascular:     Rate and Rhythm: Regular rhythm.     Pulses: Normal pulses.     Heart sounds: Normal heart sounds.  Pulmonary:     Effort: Pulmonary effort is normal.     Breath sounds: Normal breath sounds. No wheezing, rhonchi or rales.  Lymphadenopathy:     Head:     Right side of head: No submental, submandibular, tonsillar, preauricular, posterior auricular or occipital adenopathy.     Left side of head: No submental, submandibular, tonsillar, preauricular, posterior auricular or occipital adenopathy.     Cervical: No cervical adenopathy.  Skin:     General: Skin is warm and dry.  Neurological:     Mental Status: She is alert.  Psychiatric:        Speech: Speech normal.        Behavior: Behavior normal.        Thought Content: Thought content normal.        Assessment & Plan:   Problem List Items Addressed This Visit       Respiratory   Bronchitis - Primary    Afebrile.  No acute respiratory distress or  adventitious lung sounds on exam.  Reviewed [facility] urgent care visit with patient today and chest x-ray which was reassuring.  Patient has improved overall.  Advised her that we could complete doxycycline (she has 2 tablets left) and if she continues to feel better, she does not need to take more antibiotic.  However she was a bit concerned that she might need a few more days of doxycycline and I have sent in doxycycline 5 days total for her to take if she feels cough persist.  She will remain on probiotics.  advised to continue Robitussin and Mucinex.  She will let me know how she is doing      Relevant Medications   doxycycline (VIBRA-TABS) 100 MG tablet   Other Visit Diagnoses     Abnormal urine       Relevant Orders   Urinalysis, Routine w reflex microscopic   Urine Culture         I have discontinued Pricilla Holm R. Webb's doxycycline. I am also having her start on doxycycline. Additionally, I am having her maintain her diphenhydrAMINE, multivitamin with minerals, feeding supplement, FeroSul, pantoprazole, losartan, Contour Test, amLODipine, and simvastatin.   Meds ordered this encounter  Medications   doxycycline (VIBRA-TABS) 100 MG tablet    Sig: Take 1 tablet (100 mg total) by mouth 2 (two) times daily for 5 days.    Dispense:  10 tablet    Refill:  0    Order Specific Question:   Supervising Provider    Answer:   Crecencio Mc [[id number]]    Return precautions given.   Risks, benefits, and alternatives of the medications and treatment plan prescribed today were discussed, and patient expressed understanding.    Education regarding symptom management and diagnosis given to patient on AVS.  Continue to follow with Einar Pheasant, MD for routine health maintenance.   Anita Walker and I agreed with plan.   Mable Paris, FNP

## 2021-12-14 NOTE — Assessment & Plan Note (Signed)
Afebrile.  No acute respiratory distress or adventitious lung sounds on exam.  Reviewed [facility] urgent care visit with patient today and chest x-ray which was reassuring.  Patient has improved overall.  Advised her that we could complete doxycycline (she has 2 tablets left) and if she continues to feel better, she does not need to take more antibiotic.  However she was a bit concerned that she might need a few more days of doxycycline and I have sent in doxycycline 5 days total for her to take if she feels cough persist.  She will remain on probiotics.  advised to continue Robitussin and Mucinex.  She will let me know how she is doing

## 2021-12-17 LAB — URINE CULTURE
MICRO NUMBER:: 14037389
SPECIMEN QUALITY:: ADEQUATE

## 2021-12-19 ENCOUNTER — Other Ambulatory Visit: Payer: Self-pay

## 2021-12-19 ENCOUNTER — Ambulatory Visit: Payer: Medicare Other | Admitting: Internal Medicine

## 2021-12-19 DIAGNOSIS — R8290 Unspecified abnormal findings in urine: Secondary | ICD-10-CM

## 2021-12-20 DIAGNOSIS — Z86718 Personal history of other venous thrombosis and embolism: Secondary | ICD-10-CM | POA: Diagnosis not present

## 2021-12-20 DIAGNOSIS — E7800 Pure hypercholesterolemia, unspecified: Secondary | ICD-10-CM | POA: Diagnosis not present

## 2021-12-20 DIAGNOSIS — M8008XD Age-related osteoporosis with current pathological fracture, vertebra(e), subsequent encounter for fracture with routine healing: Secondary | ICD-10-CM | POA: Diagnosis not present

## 2021-12-20 DIAGNOSIS — K219 Gastro-esophageal reflux disease without esophagitis: Secondary | ICD-10-CM | POA: Diagnosis not present

## 2021-12-20 DIAGNOSIS — Z853 Personal history of malignant neoplasm of breast: Secondary | ICD-10-CM | POA: Diagnosis not present

## 2021-12-20 DIAGNOSIS — E119 Type 2 diabetes mellitus without complications: Secondary | ICD-10-CM | POA: Diagnosis not present

## 2021-12-20 DIAGNOSIS — Z9181 History of falling: Secondary | ICD-10-CM | POA: Diagnosis not present

## 2021-12-20 DIAGNOSIS — M47816 Spondylosis without myelopathy or radiculopathy, lumbar region: Secondary | ICD-10-CM | POA: Diagnosis not present

## 2021-12-20 DIAGNOSIS — I10 Essential (primary) hypertension: Secondary | ICD-10-CM | POA: Diagnosis not present

## 2021-12-20 DIAGNOSIS — M4125 Other idiopathic scoliosis, thoracolumbar region: Secondary | ICD-10-CM | POA: Diagnosis not present

## 2021-12-20 DIAGNOSIS — Z8673 Personal history of transient ischemic attack (TIA), and cerebral infarction without residual deficits: Secondary | ICD-10-CM | POA: Diagnosis not present

## 2021-12-20 DIAGNOSIS — E669 Obesity, unspecified: Secondary | ICD-10-CM | POA: Diagnosis not present

## 2021-12-20 DIAGNOSIS — Z6836 Body mass index (BMI) 36.0-36.9, adult: Secondary | ICD-10-CM | POA: Diagnosis not present

## 2021-12-20 DIAGNOSIS — R32 Unspecified urinary incontinence: Secondary | ICD-10-CM | POA: Diagnosis not present

## 2021-12-20 DIAGNOSIS — G8929 Other chronic pain: Secondary | ICD-10-CM | POA: Diagnosis not present

## 2021-12-20 DIAGNOSIS — H9190 Unspecified hearing loss, unspecified ear: Secondary | ICD-10-CM | POA: Diagnosis not present

## 2021-12-21 ENCOUNTER — Other Ambulatory Visit (INDEPENDENT_AMBULATORY_CARE_PROVIDER_SITE_OTHER): Payer: Medicare Other

## 2021-12-21 DIAGNOSIS — M8008XD Age-related osteoporosis with current pathological fracture, vertebra(e), subsequent encounter for fracture with routine healing: Secondary | ICD-10-CM | POA: Diagnosis not present

## 2021-12-21 DIAGNOSIS — R8290 Unspecified abnormal findings in urine: Secondary | ICD-10-CM | POA: Diagnosis not present

## 2021-12-21 DIAGNOSIS — M4125 Other idiopathic scoliosis, thoracolumbar region: Secondary | ICD-10-CM | POA: Diagnosis not present

## 2021-12-21 DIAGNOSIS — I10 Essential (primary) hypertension: Secondary | ICD-10-CM | POA: Diagnosis not present

## 2021-12-21 DIAGNOSIS — E119 Type 2 diabetes mellitus without complications: Secondary | ICD-10-CM | POA: Diagnosis not present

## 2021-12-21 DIAGNOSIS — M47816 Spondylosis without myelopathy or radiculopathy, lumbar region: Secondary | ICD-10-CM | POA: Diagnosis not present

## 2021-12-21 DIAGNOSIS — G8929 Other chronic pain: Secondary | ICD-10-CM | POA: Diagnosis not present

## 2021-12-21 LAB — URINALYSIS, ROUTINE W REFLEX MICROSCOPIC
Bilirubin Urine: NEGATIVE
Hgb urine dipstick: NEGATIVE
Ketones, ur: NEGATIVE
Nitrite: NEGATIVE
Specific Gravity, Urine: <=1.005 — AB (ref 1.000–1.030)
Total Protein, Urine: NEGATIVE
Urine Glucose: NEGATIVE
Urobilinogen, UA: 0.2 (ref 0.0–1.0)
pH: 6.5 (ref 5.0–8.0)

## 2021-12-24 LAB — URINE CULTURE
MICRO NUMBER:: 14067933
SPECIMEN QUALITY:: ADEQUATE

## 2021-12-26 ENCOUNTER — Encounter: Payer: Self-pay | Admitting: Podiatry

## 2021-12-26 ENCOUNTER — Ambulatory Visit (INDEPENDENT_AMBULATORY_CARE_PROVIDER_SITE_OTHER): Payer: Medicare Other | Admitting: Podiatry

## 2021-12-26 DIAGNOSIS — M79674 Pain in right toe(s): Secondary | ICD-10-CM

## 2021-12-26 DIAGNOSIS — E0843 Diabetes mellitus due to underlying condition with diabetic autonomic (poly)neuropathy: Secondary | ICD-10-CM

## 2021-12-26 DIAGNOSIS — B351 Tinea unguium: Secondary | ICD-10-CM

## 2021-12-26 DIAGNOSIS — D689 Coagulation defect, unspecified: Secondary | ICD-10-CM | POA: Diagnosis not present

## 2021-12-26 DIAGNOSIS — M79675 Pain in left toe(s): Secondary | ICD-10-CM

## 2021-12-26 NOTE — Progress Notes (Signed)
This patient returns to my office for at risk foot care.  This patient requires this care by a professional since this patient will be at risk due to having type 2 diabetes and coagulation defect   Patient is taking coumadin. This patient is unable to cut nails herself since the patient cannot reach her nails.These nails are painful walking and wearing shoes.  This patient presents for at risk foot care today.  General Appearance  Alert, conversant and in no acute stress.  Vascular  Dorsalis pedis and posterior tibial  pulses are weakly  palpable  bilaterally.  Capillary return is within normal limits  bilaterally. Temperature is within normal limits  bilaterally.  Neurologic  Senn-Weinstein monofilament wire test diminished   bilaterally. Muscle power within normal limits bilaterally.  Nails Thick disfigured discolored nails with subungual debris  from hallux to fifth toes bilaterally. No evidence of bacterial infection or drainage bilaterally.  Orthopedic  No limitations of motion  feet .  No crepitus or effusions noted.  No bony pathology or digital deformities noted.  Skin  normotropic skin with no porokeratosis noted bilaterally.  No signs of infections or ulcers noted.     Onychomycosis  Pain in right toes  Pain in left toes  Consent was obtained for treatment procedures.   Mechanical debridement of nails 1-5  bilaterally performed with a nail nipper.  Filed with dremel without incident.    Return office visit    12  weeks                  Told patient to return for periodic foot care and evaluation due to potential at risk complications.   Gardiner Barefoot DPM

## 2021-12-28 ENCOUNTER — Ambulatory Visit
Admission: RE | Admit: 2021-12-28 | Discharge: 2021-12-28 | Disposition: A | Discharge status: Home / Self Care | Payer: Medicare Other | Source: Ambulatory Visit | Attending: Internal Medicine | Admitting: Internal Medicine

## 2021-12-28 ENCOUNTER — Other Ambulatory Visit: Payer: Self-pay | Admitting: *Deleted

## 2021-12-28 DIAGNOSIS — Z1231 Encounter for screening mammogram for malignant neoplasm of breast: Secondary | ICD-10-CM | POA: Insufficient documentation

## 2021-12-30 ENCOUNTER — Telehealth: Payer: Self-pay

## 2021-12-30 ENCOUNTER — Other Ambulatory Visit: Payer: Self-pay | Admitting: Family

## 2021-12-30 DIAGNOSIS — N3000 Acute cystitis without hematuria: Secondary | ICD-10-CM

## 2021-12-30 MED ORDER — SULFAMETHOXAZOLE-TRIMETHOPRIM 800-160 MG PO TABS: 1.0000 | ORAL_TABLET | Freq: Two times a day (BID) | ORAL | 0 refills | Status: DC

## 2021-12-30 NOTE — Telephone Encounter (Signed)
Delsa Sale, caregiver for patient, called and patient was on the phone with her.  I spoke with patient and she asked [place] to talk with Wannetta Sender about her medication.  Trish states patients urine test results came back and indicated patient needs to be on medication.  Wannetta Sender states she spoke with [facility] in [place], which is patient's preferred pharmacy, and they checked with the other [place] in [place], and none of them have received a prescription for this medication for patient.

## 2021-12-30 NOTE — Telephone Encounter (Signed)
Call pt  I have sent bactrim to [facility]  in [place] as requested  Ensure to take probiotics while on antibiotics and also for 2 weeks after completion. This can either be by eating yogurt daily or taking a probiotic supplement over the counter such as Culturelle.It is important to re-colonize the gut with good bacteria and also to prevent any diarrheal infections associated with antibiotic use.

## 2022-01-02 ENCOUNTER — Telehealth: Payer: Self-pay

## 2022-01-02 NOTE — Telephone Encounter (Signed)
Pt caregiver returned call-Trish Tamala Julian

## 2022-01-02 NOTE — Telephone Encounter (Signed)
Called pt she did not understand and stated she will have her sitter CB once she gets there

## 2022-01-02 NOTE — Telephone Encounter (Signed)
Spoke with pt she states she took the last one this morning. She states she is no longer having any sx's

## 2022-01-02 NOTE — Telephone Encounter (Signed)
noted 

## 2022-01-02 NOTE — Telephone Encounter (Signed)
Called pt to tell her what FNP, Arnett advised I told her I was calling from Dr. Lars Mage office and NP Arnetts office she did not understand and stated she will have her sitter call back once she gets there.    Burnard Hawthorne, FNP 3 days ago    Call pt   I have sent [facility]act[facility] to [facility]  in [place] as requested   Ensure to take probiotics while on antibiotics and also for 2 weeks after completion. This can either be by eating yogurt daily or taking a probiotic supplement over the counter such as Culturelle.It is important to re-colonize the gut with good bacteria and also to prevent any diarrheal infections associated with antibiotic use.

## 2022-01-04 DIAGNOSIS — M818 Other osteoporosis without current pathological fracture: Secondary | ICD-10-CM | POA: Diagnosis not present

## 2022-01-06 ENCOUNTER — Other Ambulatory Visit: Payer: Self-pay | Admitting: Internal Medicine

## 2022-01-20 DIAGNOSIS — D3132 Benign neoplasm of left choroid: Secondary | ICD-10-CM | POA: Diagnosis not present

## 2022-01-27 DIAGNOSIS — Z23 Encounter for immunization: Secondary | ICD-10-CM | POA: Diagnosis not present

## 2022-02-04 ENCOUNTER — Other Ambulatory Visit: Payer: Self-pay | Admitting: Internal Medicine

## 2022-02-28 ENCOUNTER — Other Ambulatory Visit: Payer: Self-pay | Admitting: Internal Medicine

## 2022-03-01 ENCOUNTER — Other Ambulatory Visit (INDEPENDENT_AMBULATORY_CARE_PROVIDER_SITE_OTHER): Payer: Medicare Other

## 2022-03-01 DIAGNOSIS — I10 Essential (primary) hypertension: Secondary | ICD-10-CM

## 2022-03-01 DIAGNOSIS — E7800 Pure hypercholesterolemia, unspecified: Secondary | ICD-10-CM | POA: Diagnosis not present

## 2022-03-01 DIAGNOSIS — D649 Anemia, unspecified: Secondary | ICD-10-CM

## 2022-03-01 DIAGNOSIS — E1149 Type 2 diabetes mellitus with other diabetic neurological complication: Secondary | ICD-10-CM

## 2022-03-01 LAB — HEPATIC FUNCTION PANEL
ALT: 10 U/L (ref 0–35)
AST: 16 U/L (ref 0–37)
Albumin: 4.2 g/dL (ref 3.5–5.2)
Alkaline Phosphatase: 44 U/L (ref 39–117)
Bilirubin, Direct: 0.1 mg/dL (ref 0.0–0.3)
Total Bilirubin: 0.5 mg/dL (ref 0.2–1.2)
Total Protein: 6.7 g/dL (ref 6.0–8.3)

## 2022-03-01 LAB — CBC WITH DIFFERENTIAL/PLATELET
Basophils Absolute: 0 10*3/uL (ref 0.0–0.1)
Basophils Relative: 0.7 % (ref 0.0–3.0)
Eosinophils Absolute: 0.1 10*3/uL (ref 0.0–0.7)
Eosinophils Relative: 1.4 % (ref 0.0–5.0)
HCT: 41.7 % (ref 36.0–46.0)
Hemoglobin: 14.1 g/dL (ref 12.0–15.0)
Lymphocytes Relative: 26.6 % (ref 12.0–46.0)
Lymphs Abs: 1.6 10*3/uL (ref 0.7–4.0)
MCHC: 33.8 g/dL (ref 30.0–36.0)
MCV: 91.5 fl (ref 78.0–100.0)
Monocytes Absolute: 1 10*3/uL (ref 0.1–1.0)
Monocytes Relative: 16.9 % — ABNORMAL HIGH (ref 3.0–12.0)
Neutro Abs: 3.4 10*3/uL (ref 1.4–7.7)
Neutrophils Relative %: 54.4 % (ref 43.0–77.0)
Platelets: 201 10*3/uL (ref 150.0–400.0)
RBC: 4.55 Mil/uL (ref 3.87–5.11)
RDW: 14.5 % (ref 11.5–15.5)
WBC: 6.2 10*3/uL (ref 4.0–10.5)

## 2022-03-01 LAB — LIPID PANEL
Cholesterol: 144 mg/dL (ref 0–200)
HDL: 64.9 mg/dL (ref >39.00)
LDL Cholesterol: 58 mg/dL (ref 0–99)
NonHDL: 79.24
Total CHOL/HDL Ratio: 2
Triglycerides: 104 mg/dL (ref 0.0–149.0)
VLDL: 20.8 mg/dL (ref 0.0–40.0)

## 2022-03-01 LAB — BASIC METABOLIC PANEL
BUN: 17 mg/dL (ref 6–23)
CO2: 29 mEq/L (ref 19–32)
Calcium: 9.5 mg/dL (ref 8.4–10.5)
Chloride: 100 mEq/L (ref 96–112)
Creatinine, Ser: 0.79 mg/dL (ref 0.40–1.20)
GFR: 68.92 mL/min (ref >60.00)
Glucose, Bld: 100 mg/dL — ABNORMAL HIGH (ref 70–99)
Potassium: 4.3 mEq/L (ref 3.5–5.1)
Sodium: 136 mEq/L (ref 135–145)

## 2022-03-01 LAB — HEMOGLOBIN A1C: Hgb A1c MFr Bld: 5.7 % (ref 4.6–6.5)

## 2022-03-01 LAB — TSH: TSH: 3.86 u[IU]/mL (ref 0.35–5.50)

## 2022-03-01 LAB — FERRITIN: Ferritin: 55.5 ng/mL (ref 10.0–291.0)

## 2022-03-03 ENCOUNTER — Ambulatory Visit: Payer: Medicare Other | Admitting: Internal Medicine

## 2022-03-13 ENCOUNTER — Encounter: Payer: Self-pay | Admitting: Internal Medicine

## 2022-03-13 ENCOUNTER — Ambulatory Visit (INDEPENDENT_AMBULATORY_CARE_PROVIDER_SITE_OTHER): Payer: Medicare Other | Admitting: Internal Medicine

## 2022-03-13 VITALS — BP 132/78 | HR 83 | Temp 98.1°F | Resp 15 | Ht 65.0 in | Wt 249.8 lb

## 2022-03-13 DIAGNOSIS — E1149 Type 2 diabetes mellitus with other diabetic neurological complication: Secondary | ICD-10-CM

## 2022-03-13 DIAGNOSIS — D649 Anemia, unspecified: Secondary | ICD-10-CM | POA: Diagnosis not present

## 2022-03-13 DIAGNOSIS — I10 Essential (primary) hypertension: Secondary | ICD-10-CM

## 2022-03-13 DIAGNOSIS — E1142 Type 2 diabetes mellitus with diabetic polyneuropathy: Secondary | ICD-10-CM | POA: Diagnosis not present

## 2022-03-13 DIAGNOSIS — G319 Degenerative disease of nervous system, unspecified: Secondary | ICD-10-CM | POA: Diagnosis not present

## 2022-03-13 DIAGNOSIS — E7800 Pure hypercholesterolemia, unspecified: Secondary | ICD-10-CM | POA: Diagnosis not present

## 2022-03-13 DIAGNOSIS — Z853 Personal history of malignant neoplasm of breast: Secondary | ICD-10-CM | POA: Diagnosis not present

## 2022-03-13 MED ORDER — LOSARTAN POTASSIUM 100 MG PO TABS: ORAL_TABLET | ORAL | 3 refills | Status: DC

## 2022-03-13 NOTE — Progress Notes (Signed)
Subjective:    [name], female    DOB: May 31, 1940, 82 y.o.   MRN: [id number]  Patient here for  Chief Complaint  Patient presents with   Medical Management of Chronic Issues   Hypertension   Hyperlipidemia   Diabetes    HPI Here to follow up regarding diabetes, hypertension and hypercholesterolemia.  She is accompanied by her caretaker. History obtained from both of them.  She reports that she is doing well.  Uses her walker to ambulate around her house.  No recent falls.  No chest pain or shortness of breath reported.  No nausea or vomiting.  No abdominal pain.  Bowels moving.   Past Medical History:  Diagnosis Date   Anemia    Arthritis    Atrial fibrillation ([place])    Breast cancer ([place])    s/p lumpectomy 1992.  s/p chemo and xrt left breast   Diabetes mellitus ([place])    Edema    feet/legs   Gastric ulcer    GERD (gastroesophageal reflux disease)    HOH (hard of hearing)    aides   Hypercholesterolemia    Hypertension    Personal history of chemotherapy    Pulmonary emboli [facility])    Past Surgical History:  Procedure Laterality Date   BREAST LUMPECTOMY  03/06/1990   left breast   CATARACT EXTRACTION W/PHACO Right 12/26/2016   Procedure: CATARACT EXTRACTION PHACO AND INTRAOCULAR LENS PLACEMENT (IOC)-RIGHT DIABETIC;  Surgeon: Birder Robson, MD;  Location: [facility] ORS;  Service: Ophthalmology;  Laterality: Right;  [place] 00:47 AP% 24.5 CDE 11.62 Fluid pack lot # [id number]   CATARACT EXTRACTION W/PHACO Left 01/23/2017   Procedure: CATARACT EXTRACTION PHACO AND INTRAOCULAR LENS PLACEMENT (IOC);  Surgeon: Birder Robson, MD;  Location: [facility] ORS;  Service: Ophthalmology;  Laterality: Left;  [place] 00:50 AP% 16.1 CDE 8.18 Fluid pack lot #[id number]   COLONOSCOPY WITH PROPOFOL N/A 07/08/2019   Procedure: COLONOSCOPY WITH PROPOFOL;  Surgeon: Lucilla Lame, MD;  Location: [facility] ENDOSCOPY;  Service: Endoscopy;  Laterality: N/A;   Family History  Problem Relation Age  of Onset   Heart disease Father    Heart disease Brother        s/p CABG   Breast cancer Daughter    Colon cancer Neg Hx    Social History   Socioeconomic History   Marital status: Widowed    Spouse name: Not on file   Number of children: Not on file   Years of education: Not on file   Highest education level: Not on file  Occupational History   Not on file  Tobacco Use   Smoking status: Never   Smokeless tobacco: Never  Vaping Use   Vaping Use: Never used  Substance and Sexual Activity   Alcohol use: No    Alcohol/week: 0.0 standard drinks of alcohol   Drug use: No   Sexual activity: Never  Other Topics Concern   Not on file  Social History Narrative   Not on file   Social Determinants of Health   Financial Resource Strain: Low Risk  (04/27/2021)   Overall Financial Resource Strain (CARDIA)    Difficulty of Paying Living Expenses: Not hard at all  Food Insecurity: No Food Insecurity (04/27/2021)   Hunger Vital Sign    Worried About Running Out of Food in the Last Year: Never true    [place] in the Last Year: Never true  Transportation Needs: No Transportation Needs (04/27/2021)  PRAPARE - Hydrologist (Medical): No    Lack of Transportation (Non-Medical): No  Physical Activity: Insufficiently Active (04/27/2021)   Exercise Vital Sign    Days of Exercise per Week: 7 days    Minutes of Exercise per Session: 20 min  Stress: No Stress Concern Present (04/27/2021)   [place]    Feeling of Stress : Not at all  Social Connections: Unknown (04/27/2021)   Social Connection and Isolation Panel [NHANES]    Frequency of Communication with Friends and Family: More than three times a week    Frequency of Social Gatherings with Friends and Family: More than three times a week    Attends Religious Services: Not on Advertising copywriter or Organizations: Not on file     Attends Archivist Meetings: Not on file    Marital Status: Not on file     Review of Systems  Constitutional:  Negative for appetite change and unexpected weight change.  HENT:  Negative for congestion and sinus pressure.   Respiratory:  Negative for cough, chest tightness and shortness of breath.   Cardiovascular:  Negative for chest pain and palpitations.       No increased leg swelling.   Gastrointestinal:  Negative for abdominal pain, diarrhea, nausea and vomiting.  Genitourinary:  Negative for difficulty urinating and dysuria.  Musculoskeletal:  Negative for joint swelling and myalgias.  Skin:  Negative for color change and rash.  Neurological:  Negative for dizziness and headaches.  Psychiatric/Behavioral:  Negative for agitation and dysphoric mood.        Objective:     BP 132/78   Pulse 83   Temp 98.1 F (36.7 C) (Temporal)   Resp 15   Ht '5\' 5"'$  (1.651 m)   Wt 249 lb 12.8 oz (113.3 kg)   SpO2 96%   BMI 41.57 kg/m  Wt Readings from Last 3 Encounters:  03/13/22 249 lb 12.8 oz (113.3 kg)  12/14/21 245 lb 3.2 oz (111.2 kg)  10/31/21 248 lb (112.5 kg)    Physical Exam Vitals reviewed.  Constitutional:      General: She is not in acute distress.    Appearance: Normal appearance.  HENT:     Head: Normocephalic and atraumatic.     Right Ear: External ear normal.     Left Ear: External ear normal.  Eyes:     General: No scleral icterus.       Right eye: No discharge.        Left eye: No discharge.     Conjunctiva/sclera: Conjunctivae normal.  Neck:     Thyroid: No thyromegaly.  Cardiovascular:     Rate and Rhythm: Normal rate and regular rhythm.  Pulmonary:     Effort: No respiratory distress.     Breath sounds: Normal breath sounds. No wheezing.  Abdominal:     General: Bowel sounds are normal.     Palpations: Abdomen is soft.     Tenderness: There is no abdominal tenderness.  Musculoskeletal:        General: No swelling or tenderness.      Cervical back: Neck supple. No tenderness.  Lymphadenopathy:     Cervical: No cervical adenopathy.  Skin:    Findings: No erythema or rash.  Neurological:     Mental Status: She is alert.  Psychiatric:        Mood and Affect: Mood  normal.        Behavior: Behavior normal.      Outpatient Encounter Medications as of 03/13/2022  Medication Sig   amLODipine (NORVASC) 2.5 MG tablet TAKE 1 TABLET(2.5 MG) BY MOUTH DAILY   diphenhydrAMINE (BENADRYL) 25 mg capsule Take 25 mg at bedtime as needed by mouth for sleep.    feeding supplement (ENSURE ENLIVE / ENSURE PLUS) LIQD Take 237 mLs by mouth 2 (two) times daily between meals.   glucose blood (CONTOUR TEST) test strip USE TO TEST BLOOD SUGAR ONCE EVERY WEEK   Multiple Vitamin (MULTIVITAMIN WITH MINERALS) TABS tablet Take 1 tablet daily by mouth.   pantoprazole (PROTONIX) 40 MG tablet TAKE 1 TABLET(40 MG) BY MOUTH DAILY   simvastatin (ZOCOR) 10 MG tablet TAKE 1 TABLET BY MOUTH EVERY DAY   [DISCONTINUED] losartan (COZAAR) 100 MG tablet TAKE 1 TABLET(100 MG) BY MOUTH DAILY   [DISCONTINUED] sulfamethoxazole-trimethoprim (BACTRIM DS) 800-160 MG tablet Take 1 tablet by mouth 2 (two) times daily.   FEROSUL 325 (65 Fe) MG tablet TAKE 1 TABLET(325 MG) BY MOUTH DAILY (Patient taking differently: 325 mg every other day.)   losartan (COZAAR) 100 MG tablet TAKE 1 TABLET(100 MG) BY MOUTH DAILY   No facility-administered encounter medications on file as of 03/13/2022.     Lab Results  Component Value Date   WBC 6.2 03/01/2022   HGB 14.1 03/01/2022   HCT 41.7 03/01/2022   PLT 201.0 03/01/2022   GLUCOSE 100 (H) 03/01/2022   CHOL 144 03/01/2022   TRIG 104.0 03/01/2022   HDL 64.90 03/01/2022   LDLCALC 58 03/01/2022   ALT 10 03/01/2022   AST 16 03/01/2022   NA 136 03/01/2022   K 4.3 03/01/2022   CL 100 03/01/2022   CREATININE 0.79 03/01/2022   BUN 17 03/01/2022   CO2 29 03/01/2022   TSH 3.86 03/01/2022   INR 1.2 (H) 07/22/2020   HGBA1C 5.7  03/01/2022   MICROALBUR 0.9 10/26/2021    MM 3D SCREEN BREAST BILATERAL  Result Date: 12/29/2021 CLINICAL DATA:  Screening. EXAM: DIGITAL SCREENING BILATERAL MAMMOGRAM WITH TOMOSYNTHESIS AND CAD TECHNIQUE: Bilateral screening digital craniocaudal and mediolateral oblique mammograms were obtained. Bilateral screening digital breast tomosynthesis was performed. The images were evaluated with computer-aided detection. COMPARISON:  Previous exam(s). ACR Breast Density Category b: There are scattered areas of fibroglandular density. FINDINGS: There are no findings suspicious for malignancy. IMPRESSION: No mammographic evidence of malignancy. A result letter of this screening mammogram will be mailed directly to the patient. RECOMMENDATION: Screening mammogram in one year. (Code:SM-B-01Y) BI-RADS CATEGORY  1: Negative. Electronically Signed   By: Lajean Manes M.D.   On: 12/29/2021 16:10       Assessment & Plan:  Primary hypertension Assessment & Plan: Continue losartan and amlodipine.  Follow pressures.   Orders: -     Basic metabolic panel; Future  Type 2 diabetes mellitus with neurological complications (HCC) Assessment & Plan: Low carb diet and exercise as tolerated.  Follow met b and a1c.   Orders: -     Hemoglobin A1c; Future  Pure hypercholesterolemia -     Lipid panel; Future -     Hepatic function panel; Future  Anemia, unspecified type Assessment & Plan: Recent hgb wnl.  Ferritin wnl.  Decrease iron to qod. Follow cbc and ferritin   Orders: -     CBC with Differential/Platelet; Future -     Ferritin; Future  Cerebral atrophy [facility]) Assessment & Plan: Found on previous CT head.  Mild.  Stable.    Diabetic polyneuropathy associated with type 2 diabetes mellitus ([place]) Assessment & Plan: Sugars have been well controlled.  Continue low carb diet and exercise.  Continue to use walker for ambulation.  Follow met b and a1c.    Essential hypertension Assessment &  Plan: Continue losartan and amlodipine.  Blood pressure as outlined. .  Follow pressures.  Follow metabolic panel.    History of breast cancer Assessment & Plan: Mammogram 12/28/21 - birads I.    Hypercholesteremia Assessment & Plan: On simvastatin.  Low cholesterol diet and exercise.  Follow lipid panel and liver function tests.     Other orders -     Losartan Potassium; TAKE 1 TABLET(100 MG) BY MOUTH DAILY  Dispense: 90 tablet; Refill: 3     Einar Pheasant, MD

## 2022-03-13 NOTE — Patient Instructions (Signed)
Decrease ferrous sulfate to every other day.

## 2022-03-19 ENCOUNTER — Encounter: Payer: Self-pay | Admitting: Internal Medicine

## 2022-03-19 NOTE — Assessment & Plan Note (Signed)
Continue losartan and amlodipine.  Follow pressures.  

## 2022-03-19 NOTE — Assessment & Plan Note (Signed)
On simvastatin.  Low cholesterol diet and exercise.  Follow lipid panel and liver function tests.   

## 2022-03-19 NOTE — Assessment & Plan Note (Addendum)
Recent hgb wnl.  Ferritin wnl.  Decrease iron to qod. Follow cbc and ferritin

## 2022-03-19 NOTE — Assessment & Plan Note (Addendum)
Continue losartan and amlodipine.  Blood pressure as outlined. .  Follow pressures.  Follow metabolic panel.

## 2022-03-19 NOTE — Assessment & Plan Note (Signed)
Low carb diet and exercise as tolerated.  Follow met b and a1c.  

## 2022-03-19 NOTE — Assessment & Plan Note (Signed)
Found on previous CT head.  Mild.  Stable.

## 2022-03-19 NOTE — Assessment & Plan Note (Signed)
Sugars have been well controlled.  Continue low carb diet and exercise.  Continue to use walker for ambulation.  Follow met b and a1c.  

## 2022-03-19 NOTE — Assessment & Plan Note (Signed)
Mammogram 12/28/21 - birads I.

## 2022-03-30 ENCOUNTER — Ambulatory Visit: Payer: Medicare Other | Admitting: Podiatry

## 2022-03-31 DIAGNOSIS — Z86018 Personal history of other benign neoplasm: Secondary | ICD-10-CM | POA: Diagnosis not present

## 2022-03-31 DIAGNOSIS — D225 Melanocytic nevi of trunk: Secondary | ICD-10-CM | POA: Diagnosis not present

## 2022-03-31 DIAGNOSIS — Z859 Personal history of malignant neoplasm, unspecified: Secondary | ICD-10-CM | POA: Diagnosis not present

## 2022-03-31 DIAGNOSIS — Z85828 Personal history of other malignant neoplasm of skin: Secondary | ICD-10-CM | POA: Diagnosis not present

## 2022-03-31 DIAGNOSIS — L570 Actinic keratosis: Secondary | ICD-10-CM | POA: Diagnosis not present

## 2022-03-31 DIAGNOSIS — L24A1 Irritant contact dermatitis due to saliva: Secondary | ICD-10-CM | POA: Diagnosis not present

## 2022-03-31 DIAGNOSIS — L578 Other skin changes due to chronic exposure to nonionizing radiation: Secondary | ICD-10-CM | POA: Diagnosis not present

## 2022-03-31 DIAGNOSIS — Z09 Encounter for follow-up examination after completed treatment for conditions other than malignant neoplasm: Secondary | ICD-10-CM | POA: Diagnosis not present

## 2022-04-03 ENCOUNTER — Ambulatory Visit: Payer: Medicare Other | Admitting: Podiatry

## 2022-04-10 ENCOUNTER — Other Ambulatory Visit: Payer: Self-pay

## 2022-04-10 ENCOUNTER — Encounter: Payer: Self-pay | Admitting: Internal Medicine

## 2022-04-10 MED ORDER — CONTOUR TEST VI STRP: ORAL_STRIP | 1 refills | Status: DC

## 2022-04-13 ENCOUNTER — Other Ambulatory Visit (HOSPITAL_COMMUNITY): Payer: Self-pay

## 2022-04-13 ENCOUNTER — Other Ambulatory Visit: Payer: Self-pay

## 2022-04-13 DIAGNOSIS — E1149 Type 2 diabetes mellitus with other diabetic neurological complication: Secondary | ICD-10-CM

## 2022-04-13 MED ORDER — CONTOUR TEST VI STRP: ORAL_STRIP | 1 refills | Status: DC

## 2022-04-20 ENCOUNTER — Other Ambulatory Visit: Payer: Self-pay

## 2022-04-20 DIAGNOSIS — E1149 Type 2 diabetes mellitus with other diabetic neurological complication: Secondary | ICD-10-CM

## 2022-04-20 MED ORDER — CONTOUR TEST VI STRP: ORAL_STRIP | 1 refills | Status: DC

## 2022-04-24 ENCOUNTER — Ambulatory Visit (INDEPENDENT_AMBULATORY_CARE_PROVIDER_SITE_OTHER): Payer: Medicare Other | Admitting: Podiatry

## 2022-04-24 ENCOUNTER — Encounter: Payer: Self-pay | Admitting: Podiatry

## 2022-04-24 DIAGNOSIS — M79674 Pain in right toe(s): Secondary | ICD-10-CM | POA: Diagnosis not present

## 2022-04-24 DIAGNOSIS — B351 Tinea unguium: Secondary | ICD-10-CM | POA: Diagnosis not present

## 2022-04-24 DIAGNOSIS — M79675 Pain in left toe(s): Secondary | ICD-10-CM | POA: Diagnosis not present

## 2022-04-24 NOTE — Progress Notes (Signed)
  Subjective:  [name], female    DOB: 1940-07-04,  MRN: [id number]  Chief Complaint  Patient presents with   Nail Problem    "Cut my toenails."    82 y.o. female presents with the above complaint. History confirmed with patient.  She is here with her daughter.  Her toenails are thickened elongated and causing discomfort  Objective:  Physical Exam: warm, good capillary refill, no trophic changes or ulcerative lesions, normal DP and PT pulses, and normal sensory exam. Left Foot: dystrophic yellowed discolored nail plates with subungual debris Right Foot: dystrophic yellowed discolored nail plates with subungual debris   Assessment:   1. Pain due to onychomycosis of toenails of both feet      Plan:  Patient was evaluated and treated and all questions answered.   Discussed the etiology and treatment options for the condition in detail with the patient.  Debridement has been helpful in the past. Recommended debridement of the nails today. Sharp and mechanical debridement performed of all painful and mycotic nails today. Nails debrided in length and thickness using a nail nipper to level of comfort. Discussed treatment options including appropriate shoe gear. Follow up as needed for painful nails.    Return in about 3 months (around 07/23/2022) for at risk diabetic foot care.

## 2022-04-26 ENCOUNTER — Encounter: Payer: Self-pay | Admitting: Internal Medicine

## 2022-04-26 ENCOUNTER — Ambulatory Visit (INDEPENDENT_AMBULATORY_CARE_PROVIDER_SITE_OTHER): Payer: Medicare Other | Admitting: Internal Medicine

## 2022-04-26 VITALS — BP 130/78 | HR 84 | Temp 98.0°F | Resp 16 | Ht 65.0 in | Wt 247.4 lb

## 2022-04-26 DIAGNOSIS — H9193 Unspecified hearing loss, bilateral: Secondary | ICD-10-CM

## 2022-04-26 DIAGNOSIS — H9190 Unspecified hearing loss, unspecified ear: Secondary | ICD-10-CM | POA: Insufficient documentation

## 2022-04-26 DIAGNOSIS — R0981 Nasal congestion: Secondary | ICD-10-CM | POA: Diagnosis not present

## 2022-04-26 DIAGNOSIS — I10 Essential (primary) hypertension: Secondary | ICD-10-CM | POA: Diagnosis not present

## 2022-04-26 NOTE — Progress Notes (Signed)
Subjective:    [name], female    DOB: [birth year], 82 y.o.   MRN: [id number]  Patient here for  Chief Complaint  Patient presents with   Hearing Loss    HPI Work in appt with concerns regarding trouble hearing. She is accompanied by her caretaker. History obtained from both of them.  Noticed over the last couple of weeks worsening hearing.  No ear pain.  Some congestion - nasal this am, but this has not been a problem for her before.  Some cough.  No chest pain or sob reported.  No increased chest congestion.     Past Medical History:  Diagnosis Date   Anemia    Arthritis    Atrial fibrillation ([place])    Breast cancer ([place])    s/p lumpectomy 1992.  s/p chemo and xrt left breast   Diabetes mellitus ([place])    Edema    feet/legs   Gastric ulcer    GERD (gastroesophageal reflux disease)    HOH (hard of hearing)    aides   Hypercholesterolemia    Hypertension    Personal history of chemotherapy    Pulmonary emboli [facility])    Past Surgical History:  Procedure Laterality Date   BREAST LUMPECTOMY  03/06/1990   left breast   CATARACT EXTRACTION W/PHACO Right 12/26/2016   Procedure: CATARACT EXTRACTION PHACO AND INTRAOCULAR LENS PLACEMENT (IOC)-RIGHT DIABETIC;  Surgeon: Birder Robson, MD;  Location: [facility] ORS;  Service: Ophthalmology;  Laterality: Right;  [place] 00:47 AP% 24.5 CDE 11.62 Fluid pack lot # [id number]   CATARACT EXTRACTION W/PHACO Left 01/23/2017   Procedure: CATARACT EXTRACTION PHACO AND INTRAOCULAR LENS PLACEMENT (IOC);  Surgeon: Birder Robson, MD;  Location: [facility] ORS;  Service: Ophthalmology;  Laterality: Left;  [place] 00:50 AP% 16.1 CDE 8.18 Fluid pack lot [id number]   COLONOSCOPY WITH PROPOFOL N/A 07/08/2019   Procedure: COLONOSCOPY WITH PROPOFOL;  Surgeon: Lucilla Lame, MD;  Location[facility] ENDOSCOPY;  Service: Endoscopy;  Laterality: N/A;   Family History  Problem Relation Age of Onset   Heart disease Father    Heart disease Brother        s/p  CABG   Breast cancer Daughter    Colon cancer Neg Hx    Social History   Socioeconomic History   Marital status: Widowed    Spouse name: Not on file   Number of children: Not on file   Years of education: Not on file   Highest education level: Not on file  Occupational History   Not on file  Tobacco Use   Smoking status: Never   Smokeless tobacco: Never  Vaping Use   Vaping Use: Never used  Substance and Sexual Activity   Alcohol use: No    Alcohol/week: 0.0 standard drinks of alcohol   Drug use: No   Sexual activity: Never  Other Topics Concern   Not on file  Social History Narrative   Not on file   Social Determinants of Health   Financial Resource Strain: Low Risk  (04/27/2021)   Overall Financial Resource Strain (CARDIA)    Difficulty of Paying Living Expenses: Not hard at all  Food Insecurity: No Food Insecurity (04/27/2021)   Hunger Vital Sign    Worried About Running Out of Food in the Last Year: Never true    [place] in the Last Year: Never true  Transportation Needs: No Transportation Needs (04/27/2021)   PRAPARE - Transportation    Lack of  Transportation (Medical): No    Lack of Transportation (Non-Medical): No  Physical Activity: Insufficiently Active (04/27/2021)   Exercise Vital Sign    Days of Exercise per Week: 7 days    Minutes of Exercise per Session: 20 min  Stress: No Stress Concern Present (04/27/2021)   [place]    Feeling of Stress : Not at all  Social Connections: Unknown (04/27/2021)   Social Connection and Isolation Panel [NHANES]    Frequency of Communication with Friends and Family: More than three times a week    Frequency of Social Gatherings with Friends and Family: More than three times a week    Attends Religious Services: Not on Advertising copywriter or Organizations: Not on file    Attends Archivist Meetings: Not on file    Marital Status:  Not on file     Review of Systems  Constitutional:  Negative for appetite change and unexpected weight change.  HENT:  Positive for congestion and hearing loss.   Respiratory:  Positive for cough. Negative for chest tightness and shortness of breath.   Cardiovascular:  Negative for chest pain, palpitations and leg swelling.  Gastrointestinal:  Negative for abdominal pain, diarrhea, nausea and vomiting.  Genitourinary:  Negative for difficulty urinating and dysuria.  Musculoskeletal:  Negative for joint swelling and myalgias.  Skin:  Negative for color change and rash.  Neurological:  Negative for dizziness and headaches.  Psychiatric/Behavioral:  Negative for agitation and dysphoric mood.        Objective:     BP 130/78   Pulse 84   Temp 98 F (36.7 C)   Resp 16   Ht 5' 5"$  (1.651 m)   Wt 247 lb 6.4 oz (112.2 kg)   SpO2 96%   BMI 41.17 kg/m  Wt Readings from Last 3 Encounters:  04/26/22 247 lb 6.4 oz (112.2 kg)  03/13/22 249 lb 12.8 oz (113.3 kg)  12/14/21 245 lb 3.2 oz (111.2 kg)    Physical Exam Vitals reviewed.  Constitutional:      General: She is not in acute distress.    Appearance: Normal appearance.  HENT:     Head: Normocephalic and atraumatic.     Right Ear: Tympanic membrane, ear canal and external ear normal. There is no impacted cerumen.     Left Ear: Tympanic membrane, ear canal and external ear normal. There is no impacted cerumen.  Eyes:     General: No scleral icterus.       Right eye: No discharge.        Left eye: No discharge.     Conjunctiva/sclera: Conjunctivae normal.  Neck:     Thyroid: No thyromegaly.  Cardiovascular:     Rate and Rhythm: Normal rate and regular rhythm.  Pulmonary:     Effort: No respiratory distress.     Breath sounds: Normal breath sounds. No wheezing.  Abdominal:     General: Bowel sounds are normal.     Palpations: Abdomen is soft.     Tenderness: There is no abdominal tenderness.  Musculoskeletal:         General: No swelling or tenderness.     Cervical back: Neck supple. No tenderness.  Lymphadenopathy:     Cervical: No cervical adenopathy.  Skin:    Findings: No erythema or rash.  Neurological:     Mental Status: She is alert.  Psychiatric:  Mood and Affect: Mood normal.        Behavior: Behavior normal.      Outpatient Encounter Medications as of 04/26/2022  Medication Sig   amLODipine (NORVASC) 2.5 MG tablet TAKE 1 TABLET(2.5 MG) BY MOUTH DAILY   diphenhydrAMINE (BENADRYL) 25 mg capsule Take 25 mg at bedtime as needed by mouth for sleep.    feeding supplement (ENSURE ENLIVE / ENSURE PLUS) LIQD Take 237 mLs by mouth 2 (two) times daily between meals.   glucose blood (CONTOUR TEST) test strip USE TO TEST BLOOD SUGAR ONCE DAILY   losartan (COZAAR) 100 MG tablet TAKE 1 TABLET(100 MG) BY MOUTH DAILY   Multiple Vitamin (MULTIVITAMIN WITH MINERALS) TABS tablet Take 1 tablet daily by mouth.   pantoprazole (PROTONIX) 40 MG tablet TAKE 1 TABLET(40 MG) BY MOUTH DAILY   simvastatin (ZOCOR) 10 MG tablet TAKE 1 TABLET BY MOUTH EVERY DAY   [DISCONTINUED] FEROSUL 325 (65 Fe) MG tablet TAKE 1 TABLET(325 MG) BY MOUTH DAILY (Patient not taking: Reported on 04/24/2022)   No facility-administered encounter medications on file as of 04/26/2022.     Lab Results  Component Value Date   WBC 6.2 03/01/2022   HGB 14.1 03/01/2022   HCT 41.7 03/01/2022   PLT 201.0 03/01/2022   GLUCOSE 100 (H) 03/01/2022   CHOL 144 03/01/2022   TRIG 104.0 03/01/2022   HDL 64.90 03/01/2022   LDLCALC 58 03/01/2022   ALT 10 03/01/2022   AST 16 03/01/2022   NA 136 03/01/2022   K 4.3 03/01/2022   CL 100 03/01/2022   CREATININE 0.79 03/01/2022   BUN 17 03/01/2022   CO2 29 03/01/2022   TSH 3.86 03/01/2022   INR 1.2 (H) 07/22/2020   HGBA1C 5.7 03/01/2022   MICROALBUR 0.9 10/26/2021    MM 3D SCREEN BREAST BILATERAL  Result Date: 12/29/2021 CLINICAL DATA:  Screening. EXAM: DIGITAL SCREENING BILATERAL  MAMMOGRAM WITH TOMOSYNTHESIS AND CAD TECHNIQUE: Bilateral screening digital craniocaudal and mediolateral oblique mammograms were obtained. Bilateral screening digital breast tomosynthesis was performed. The images were evaluated with computer-aided detection. COMPARISON:  Previous exam(s). ACR Breast Density Category b: There are scattered areas of fibroglandular density. FINDINGS: There are no findings suspicious for malignancy. IMPRESSION: No mammographic evidence of malignancy. A result letter of this screening mammogram will be mailed directly to the patient. RECOMMENDATION: Screening mammogram in one year. (Code:SM-B-01Y) BI-RADS CATEGORY  1: Negative. Electronically Signed   By: Lajean Manes M.D.   On: 12/29/2021 16:10       Assessment & Plan:  Bilateral hearing loss, unspecified hearing loss type Assessment & Plan: Has hearing aids, but has noticed a decrease in hearing (more decrease) over the last two weeks.  No ear pain.  No cerumen impaction.  Has had hearing aids cleaned.  Will have ENT evaluate for the change in her hearing.   Orders: -     Ambulatory referral to ENT  Primary hypertension Assessment & Plan: Continue losartan and amlodipine.  Follow pressures.    Congestion of nasal sinus Assessment & Plan: Minimal congestion and cough - started today.  Nasacort nasal spray as directed.  Robitussin DM as directed.  Follow.  Call with update.       Einar Pheasant, MD

## 2022-04-26 NOTE — Assessment & Plan Note (Signed)
Continue losartan and amlodipine.  Follow pressures.

## 2022-04-26 NOTE — Patient Instructions (Signed)
Nasacort nasal spray - 2 sprays each nostril one time per day.    Robitussin DM twice a day as needed for cough and congestion.

## 2022-04-26 NOTE — Assessment & Plan Note (Signed)
Minimal congestion and cough - started today.  Nasacort nasal spray as directed.  Robitussin DM as directed.  Follow.  Call with update.

## 2022-04-26 NOTE — Assessment & Plan Note (Signed)
Has hearing aids, but has noticed a decrease in hearing (more decrease) over the last two weeks.  No ear pain.  No cerumen impaction.  Has had hearing aids cleaned.  Will have ENT evaluate for the change in her hearing.

## 2022-04-27 ENCOUNTER — Telehealth: Payer: Self-pay | Admitting: Internal Medicine

## 2022-04-27 NOTE — Telephone Encounter (Signed)
Contacted Jo Ann R [place] to schedule their annual wellness visit. Appointment made for 05/05/2022.  Thank you,  [place] Direct dial  [phone]

## 2022-05-05 ENCOUNTER — Ambulatory Visit (INDEPENDENT_AMBULATORY_CARE_PROVIDER_SITE_OTHER): Payer: Medicare Other

## 2022-05-05 VITALS — Ht 65.0 in | Wt 247.0 lb

## 2022-05-05 DIAGNOSIS — Z0000 Encounter for general adult medical examination without abnormal findings: Secondary | ICD-10-CM | POA: Diagnosis not present

## 2022-05-05 NOTE — Progress Notes (Signed)
Subjective:   Anita Walker is a 82 y.o. female who presents for Medicare Annual (Subsequent) preventive examination.  Review of Systems    No ROS.  Medicare Wellness Virtual Visit.  Visual/audio telehealth visit, UTA vital signs.   See social history for additional risk factors.   Cardiac Risk Factors include: advanced age (>32mn, >>105women);diabetes mellitus;hypertension     Objective:    Today's Vitals   05/05/22 0918  Weight: 247 lb (112 kg)  Height: '5\' 5"'$  (1.651 m)   Body mass index is 41.1 kg/m.     05/05/2022    9:30 AM 04/27/2021   10:21 AM 04/15/2020    6:03 PM 04/02/2020    3:38 PM 01/01/2020   11:22 AM 11/18/2019    9:54 AM 10/21/2019    6:02 AM  Advanced Directives  Does Patient Have a Medical Advance Directive? Yes Yes No Yes Yes Yes No  Type of AParamedicof [place]Living will [place] will  [place] will     Does patient want to make changes to medical advance directive? No - Patient declined No - Patient declined No - Patient declined No - Patient declined     Copy of [place]in Chart? Yes - validated most recent copy scanned in chart (See row information) Yes - validated most recent copy scanned in chart (See row information)  Yes - validated most recent copy scanned in chart (See row information)     Would patient like information on creating a medical advance directive?   No - Patient declined    No - Patient declined    Current Medications (verified) Outpatient Encounter Medications as of 05/05/2022  Medication Sig   amLODipine (NORVASC) 2.5 MG tablet TAKE 1 TABLET(2.5 MG) BY MOUTH DAILY   diphenhydrAMINE (BENADRYL) 25 mg capsule Take 25 mg at bedtime as needed by mouth for sleep.    feeding supplement (ENSURE ENLIVE / ENSURE PLUS) LIQD Take 237 mLs by mouth 2 (two) times daily between meals.   glucose blood (CONTOUR TEST) test strip USE TO TEST BLOOD  SUGAR ONCE DAILY   losartan (COZAAR) 100 MG tablet TAKE 1 TABLET(100 MG) BY MOUTH DAILY   Multiple Vitamin (MULTIVITAMIN WITH MINERALS) TABS tablet Take 1 tablet daily by mouth.   pantoprazole (PROTONIX) 40 MG tablet TAKE 1 TABLET(40 MG) BY MOUTH DAILY   simvastatin (ZOCOR) 10 MG tablet TAKE 1 TABLET BY MOUTH EVERY DAY   No facility-administered encounter medications on file as of 05/05/2022.    Allergies (verified) Penicillins and Penicillin v potassium   History: Past Medical History:  Diagnosis Date   Anemia    Arthritis    Atrial fibrillation ([place]    [place]    s/p lumpectomy 1992.  s/p chemo and xrt left breast   Diabetes mellitus ([place]    Edema    feet/legs   Gastric ulcer    GERD (gastroesophageal reflux disease)    HOH (hard of hearing)    aides   Hypercholesterolemia    Hypertension    Personal history of chemotherapy    Pulmonary emboli ([facility]    Past Surgical History:  Procedure Laterality Date   BREAST LUMPECTOMY  03/06/1990   left breast   CATARACT EXTRACTION W/PHACO Right 12/26/2016   Procedure: CATARACT EXTRACTION PHACO AND INTRAOCULAR LENS PLACEMENT (IOC)-RIGHT DIABETIC;  Surgeon: PBirder Robson MD;  Location: [facility] ORS;  Service: Ophthalmology;  Laterality: Right;  [place] 00:47 AP% 24.5 CDE 11.62 Fluid pack lot # [id number]   CATARACT EXTRACTION W/PHACO Left 01/23/2017   Procedure: CATARACT EXTRACTION PHACO AND INTRAOCULAR LENS PLACEMENT (IOC);  Surgeon: Birder Robson, MD;  Location: [facility] ORS;  Service: Ophthalmology;  Laterality: Left;  [place] 00:50 AP% 16.1 CDE 8.18 Fluid pack lot [id number]   COLONOSCOPY WITH PROPOFOL N/A 07/08/2019   Procedure: COLONOSCOPY WITH PROPOFOL;  Surgeon: Lucilla Lame, MD;  Location: [facility] ENDOSCOPY;  Service: Endoscopy;  Laterality: N/A;   Family History  Problem Relation Age of Onset   Heart disease Father    Heart disease Brother        s/p CABG   Breast cancer Daughter    Colon cancer Neg Hx    Social History    Socioeconomic History   Marital status: Widowed    Spouse name: Not on file   Number of children: Not on file   Years of education: Not on file   Highest education level: Not on file  Occupational History   Not on file  Tobacco Use   Smoking status: Never   Smokeless tobacco: Never  Vaping Use   Vaping Use: Never used  Substance and Sexual Activity   Alcohol use: No    Alcohol/week: 0.0 standard drinks of alcohol   Drug use: No   Sexual activity: Never  Other Topics Concern   Not on file  Social History Narrative   Not on file   Social Determinants of Health   Financial Resource Strain: Low Risk  (05/05/2022)   Overall Financial Resource Strain (CARDIA)    Difficulty of Paying Living Expenses: Not hard at all  Food Insecurity: No Food Insecurity (05/05/2022)   Hunger Vital Sign    Worried About Running Out of Food in the Last Year: Never true    [place] in the Last Year: Never true  Transportation Needs: No Transportation Needs (05/05/2022)   PRAPARE - Hydrologist (Medical): No    Lack of Transportation (Non-Medical): No  Physical Activity: Insufficiently Active (05/05/2022)   Exercise Vital Sign    Days of Exercise per Week: 7 days    Minutes of Exercise per Session: 20 min  Stress: No Stress Concern Present (05/05/2022)   [place]    Feeling of Stress : Not at all  Social Connections: Unknown (05/05/2022)   Social Connection and Isolation Panel [NHANES]    Frequency of Communication with Friends and Family: More than three times a week    Frequency of Social Gatherings with Friends and Family: More than three times a week    Attends Religious Services: Not on Advertising copywriter or Organizations: Not on file    Attends Archivist Meetings: Not on file    Marital Status: Not on file    Tobacco Counseling Counseling given: Not  Answered   Clinical Intake:  Pre-visit preparation completed: Yes        Diabetes: Yes (Followed by pcp)  How often do you need to have someone help you when you read instructions, pamphlets, or other written materials from your doctor or pharmacy?: 3 - Sometimes  Nutrition Risk Assessment: Has the patient had any N/V/D within the last 2 months?  No  Does the patient have any non-healing wounds?  No  Has the patient had any unintentional weight loss or weight gain?  No   Financial Strains  and Diabetes Management: Are you having any financial strains with the device, your supplies or your medication? No .  Does the patient want to be seen by Chronic Care Management for management of their diabetes?  No  Would the patient like to be referred to a Nutritionist or for Diabetic Management?  No     Interpreter Needed?: No      Activities of Daily Living    05/05/2022    9:20 AM  In your present state of health, do you have any difficulty performing the following activities:  Hearing? 1  Vision? 0  Difficulty concentrating or making decisions? 0  Walking or climbing stairs? 1  Comment Walker in use  Dressing or bathing? 0  Doing errands, shopping? 1  Comment [place] and eating ? Y  Comment Family assist with meal prep. Self feeds.  Using the Toilet? N  In the past six months, have you accidently leaked urine? Y  Comment Managed with depend brief  Do you have problems with loss of bowel control? N  Managing your Medications? N  Comment Daughter assist as needed  Managing your Finances? N  Comment Daughter assist as needed  Housekeeping or managing your Housekeeping? Y  Comment Family assist    Patient Care Team: Einar Pheasant, MD as PCP - General (Internal Medicine)  Indicate any recent Medical Services you may have received from other than [facility] providers in the past year (date may be approximate).     Assessment:   This is a routine  wellness examination for Blasa Ruediger.  I connected with  Anita Walker on 05/05/22 by a audio enabled telemedicine application and verified that I am speaking with the correct person using two identifiers.  Patient Location: Home  Provider Location: Office/Clinic  I discussed the limitations of evaluation and management by telemedicine. The patient expressed understanding and agreed to proceed.   Hearing/Vision screen Hearing Screening - Comments:: Followed by [facility] every 3 months  Hearing aid, bilateral Vision Screening - Comments:: Followed by [facility]  Wears corrective lenses Cataract extraction, bilateral  Dietary issues and exercise activities discussed: Current Exercise Habits: Home exercise routine, Type of exercise: walking, Time (Minutes): 20, Frequency (Times/Week): 7, Weekly Exercise (Minutes/Week): 140, Intensity: Mild   Goals Addressed             This Visit's Progress    Follow up with Primary Care Provider       As needed       Depression Screen    05/05/2022    9:26 AM 07/08/2021    1:34 PM 06/22/2021   10:28 AM 04/27/2021    9:56 AM 03/16/2021   11:35 AM 12/15/2020    9:17 AM 04/02/2020    3:34 PM  PHQ 2/9 Scores  PHQ - 2 Score 0 0 0 0 0 0 0    Fall Risk    05/05/2022    9:31 AM 03/13/2022   10:03 AM 07/08/2021    1:34 PM 06/22/2021   10:28 AM 04/27/2021   10:22 AM  Fall Risk   Falls in the past year? 0 0 1 0 0  Number falls in past yr: 0 0 1  0  Injury with Fall? 0 0 1    Risk for fall due to :  History of fall(s);Impaired balance/gait;Impaired mobility History of fall(s) No Fall Risks   Follow up Falls evaluation completed;Falls prevention discussed Falls evaluation completed Falls evaluation  completed Falls evaluation completed Falls evaluation completed    FALL RISK PREVENTION PERTAINING TO THE HOME: Home free of loose throw rugs in walkways, pet beds, electrical cords, etc? Yes  Adequate lighting in your home to reduce risk of  falls? Yes   ASSISTIVE DEVICES UTILIZED TO PREVENT FALLS: Life alert? No  Use of a cane, walker or w/c? Yes  Grab bars in the bathroom? Yes  Shower chair or bench in shower? Yes  Elevated toilet seat or a handicapped toilet? Yes   TIMED UP AND GO: Was the test performed? No .   Cognitive Function: Patient is alert and oriented x3.     02/11/2016    2:39 PM  MMSE - Mini Mental State Exam  Not completed: Refused        05/05/2022    9:32 AM 03/21/2019    9:52 AM 03/13/2018    2:24 PM 03/02/2017   10:25 AM 02/11/2016    2:41 PM  6CIT Screen  What Year? 0 points 0 points 0 points 0 points 0 points  What month? 0 points 0 points 0 points 0 points 0 points  What time? 0 points 0 points 0 points 0 points 0 points  Count back from 20 0 points 0 points 0 points 0 points   Months in reverse  0 points 4 points 0 points   Repeat phrase  2 points  0 points 2 points  Total Score  2 points  0 points     Immunizations Immunization History  Administered Date(s) Administered   Fluad Quad(high Dose 65+) 12/11/2018, 12/02/2019, 11/04/2020   Influenza Split 11/19/2011, 12/09/2012, 12/27/2017   Influenza, High Dose Seasonal PF 12/02/2015, 11/07/2016, 11/14/2017   Influenza,inj,Quad PF,6+ Mos 12/03/2013, 11/12/2014   Influenza-Unspecified 12/12/2011, 12/11/2012, 12/03/2013, 11/12/2014, 12/02/2015   PFIZER(Purple Top)SARS-COV-2 Vaccination 03/20/2019, 04/10/2019, 01/05/2020   Pneumococcal Conjugate-13 01/14/2014   Pneumococcal Polysaccharide-23 02/11/2016   Zoster Recombinat (Shingrix) 09/15/2017, 11/15/2017, 04/03/2018    Screening Tests Health Maintenance  Topic Date Due   COVID-19 Vaccine (4 - 2023-24 season) 05/12/2022 (Originally 11/04/2021)   INFLUENZA VACCINE  06/04/2022 (Originally 10/04/2021)   HEMOGLOBIN A1C  08/31/2022   FOOT EXAM  09/20/2022   Diabetic kidney evaluation - Urine ACR  10/27/2022   MAMMOGRAM  12/29/2022   OPHTHALMOLOGY EXAM  01/14/2023   Diabetic kidney evaluation  - eGFR measurement  03/02/2023   Medicare Annual Wellness (AWV)  05/05/2023   Pneumonia Vaccine 13+ Years old  Completed   DEXA SCAN  Completed   Zoster Vaccines- Shingrix  Completed   HPV VACCINES  Aged Out   DTaP/Tdap/Td  Discontinued    Health Maintenance There are no preventive care reminders to display for this patient.  Lung Cancer Screening: (Low Dose CT Chest recommended if Age 85-80 years, 30 pack-year currently smoking OR have quit w/in 15years.) does not qualify.   Hepatitis C Screening: does not qualify.  Vision Screening: Recommended annual ophthalmology exams for early detection of glaucoma and other disorders of the eye.  Dental Screening: Recommended annual dental exams for proper oral hygiene  Community Resource Referral / Chronic Care Management: CRR required this visit?  No   CCM required this visit?  No      Plan:     I have personally reviewed and noted the following in the patient's chart:   Medical and social history Use of alcohol, tobacco or illicit drugs  Current medications and supplements including opioid prescriptions. Patient is not currently taking opioid prescriptions. Functional  ability and status Nutritional status Physical activity Advanced directives List of other physicians Hospitalizations, surgeries, and ER visits in previous 12 months Vitals Screenings to include cognitive, depression, and falls Referrals and appointments  In addition, I have reviewed and discussed with patient certain preventive protocols, quality metrics, and best practice recommendations. A written personalized care plan for preventive services as well as general preventive health recommendations were provided to patient.     Leta Jungling, LPN   [id number]

## 2022-05-05 NOTE — Patient Instructions (Addendum)
Anita Walker , Thank you for taking time to come for your Medicare Wellness Visit. I appreciate your ongoing commitment to your health goals. Please review the following plan we discussed and let me know if I can assist you in the future.   These are the goals we discussed:  Goals      Follow up with Primary Care Provider     As needed        This is a list of the screening recommended for you and due dates:  Health Maintenance  Topic Date Due   COVID-19 Vaccine (4 - 2023-24 season) 05/12/2022*   Flu Shot  06/04/2022*   Hemoglobin A1C  08/31/2022   Complete foot exam   09/20/2022   Yearly kidney health urinalysis for diabetes  10/27/2022   Mammogram  12/29/2022   Eye exam for diabetics  01/14/2023   Yearly kidney function blood test for diabetes  03/02/2023   Medicare Annual Wellness Visit  05/05/2023   Pneumonia Vaccine  Completed   DEXA scan (bone density measurement)  Completed   Zoster (Shingles) Vaccine  Completed   HPV Vaccine  Aged Out   DTaP/Tdap/Td vaccine  Discontinued  *Topic was postponed. The date shown is not the original due date.    Advanced directives: on file  Conditions/risks identified: none new  Next appointment: Follow up in one year for your annual wellness visit    Preventive Care 65 Years and Older, Female Preventive care refers to lifestyle choices and visits with your health care provider that can promote health and wellness. What does preventive care include? A yearly physical exam. This is also called an annual well check. Dental exams once or twice a year. Routine eye exams. Ask your health care provider how often you should have your eyes checked. Personal lifestyle choices, including: Daily care of your teeth and gums. Regular physical activity. Eating a healthy diet. Avoiding tobacco and drug use. Limiting alcohol use. Practicing safe sex. Taking low-dose aspirin every day. Taking vitamin and mineral supplements as recommended by  your health care provider. What happens during an annual well check? The services and screenings done by your health care provider during your annual well check will depend on your age, overall health, lifestyle risk factors, and family history of disease. Counseling  Your health care provider may ask you questions about your: Alcohol use. Tobacco use. Drug use. Emotional well-being. Home and relationship well-being. Sexual activity. Eating habits. History of falls. Memory and ability to understand (cognition). Work and work Statistician. Reproductive health. Screening  You may have the following tests or measurements: Height, weight, and BMI. Blood pressure. Lipid and cholesterol levels. These may be checked every 5 years, or more frequently if you are over 105 years old. Skin check. Lung cancer screening. You may have this screening every year starting at age 91 if you have a 30-pack-year history of smoking and currently smoke or have quit within the past 15 years. Fecal occult blood test (FOBT) of the stool. You may have this test every year starting at age 32. Flexible sigmoidoscopy or colonoscopy. You may have a sigmoidoscopy every 5 years or a colonoscopy every 10 years starting at age 50. Hepatitis C blood test. Hepatitis B blood test. Sexually transmitted disease (STD) testing. Diabetes screening. This is done by checking your blood sugar (glucose) after you have not eaten for a while (fasting). You may have this done every 1-3 years. Bone density scan. This is done to screen for  osteoporosis. You may have this done starting at age 7. Mammogram. This may be done every 1-2 years. Talk to your health care provider about how often you should have regular mammograms. Talk with your health care provider about your test results, treatment options, and if necessary, the need for more tests. Vaccines  Your health care provider may recommend certain vaccines, such as: Influenza  vaccine. This is recommended every year. Tetanus, diphtheria, and acellular pertussis (Tdap, Td) vaccine. You may need a Td booster every 10 years. Zoster vaccine. You may need this after age 65. Pneumococcal 13-valent conjugate (PCV13) vaccine. One dose is recommended after age 71. Pneumococcal polysaccharide (PPSV23) vaccine. One dose is recommended after age 33. Talk to your health care provider about which screenings and vaccines you need and how often you need them. This information is not intended to replace advice given to you by your health care provider. Make sure you discuss any questions you have with your health care provider. Document Released: 03/19/2015 Document Revised: 11/10/2015 Document Reviewed: 12/22/2014 Elsevier Interactive Patient Education  2017 [place] Prevention in the Home Falls can cause injuries. They can happen to people of all ages. There are many things you can do to make your home safe and to help prevent falls. What can I do on the outside of my home? Regularly fix the edges of walkways and driveways and fix any cracks. Remove anything that might make you trip as you walk through a door, such as a raised step or threshold. Trim any bushes or trees on the path to your home. Use bright outdoor lighting. Clear any walking paths of anything that might make someone trip, such as rocks or tools. Regularly check to see if handrails are loose or broken. Make sure that both sides of any steps have handrails. Any raised decks and porches should have guardrails on the edges. Have any leaves, snow, or ice cleared regularly. Use sand or salt on walking paths during winter. Clean up any spills in your garage right away. This includes oil or grease spills. What can I do in the bathroom? Use night lights. Install grab bars by the toilet and in the tub and shower. Do not use towel bars as grab bars. Use non-skid mats or decals in the tub or shower. If you  need to sit down in the shower, use a plastic, non-slip stool. Keep the floor dry. Clean up any water that spills on the floor as soon as it happens. Remove soap buildup in the tub or shower regularly. Attach bath mats securely with double-sided non-slip rug tape. Do not have throw rugs and other things on the floor that can make you trip. What can I do in the bedroom? Use night lights. Make sure that you have a light by your bed that is easy to reach. Do not use any sheets or blankets that are too big for your bed. They should not hang down onto the floor. Have a firm chair that has side arms. You can use this for support while you get dressed. Do not have throw rugs and other things on the floor that can make you trip. What can I do in the kitchen? Clean up any spills right away. Avoid walking on wet floors. Keep items that you use a lot in easy-to-reach places. If you need to reach something above you, use a strong step stool that has a grab bar. Keep electrical cords out of the way. Do not  use floor polish or wax that makes floors slippery. If you must use wax, use non-skid floor wax. Do not have throw rugs and other things on the floor that can make you trip. What can I do with my stairs? Do not leave any items on the stairs. Make sure that there are handrails on both sides of the stairs and use them. Fix handrails that are broken or loose. Make sure that handrails are as long as the stairways. Check any carpeting to make sure that it is firmly attached to the stairs. Fix any carpet that is loose or worn. Avoid having throw rugs at the top or bottom of the stairs. If you do have throw rugs, attach them to the floor with carpet tape. Make sure that you have a light switch at the top of the stairs and the bottom of the stairs. If you do not have them, ask someone to add them for you. What else can I do to help prevent falls? Wear shoes that: Do not have high heels. Have rubber  bottoms. Are comfortable and fit you well. Are closed at the toe. Do not wear sandals. If you use a stepladder: Make sure that it is fully opened. Do not climb a closed stepladder. Make sure that both sides of the stepladder are locked into place. Ask someone to hold it for you, if possible. Clearly mark and make sure that you can see: Any grab bars or handrails. First and last steps. Where the edge of each step is. Use tools that help you move around (mobility aids) if they are needed. These include: Canes. Walkers. Scooters. Crutches. Turn on the lights when you go into a dark area. Replace any light bulbs as soon as they burn out. Set up your furniture so you have a clear path. Avoid moving your furniture around. If any of your floors are uneven, fix them. If there are any pets around you, be aware of where they are. Review your medicines with your doctor. Some medicines can make you feel dizzy. This can increase your chance of falling. Ask your doctor what other things that you can do to help prevent falls. This information is not intended to replace advice given to you by your health care provider. Make sure you discuss any questions you have with your health care provider. Document Released: 12/17/2008 Document Revised: 07/29/2015 Document Reviewed: 03/27/2014 Elsevier Interactive Patient Education  2017 [employer].

## 2022-05-19 ENCOUNTER — Other Ambulatory Visit: Payer: Self-pay | Admitting: Internal Medicine

## 2022-05-21 NOTE — Telephone Encounter (Signed)
Her last hgb and iron stores - wnl.  If she has not been on iron supplements and last labs were drawn without her having been on the iron supplement, would recommend remaining off.  Let me know if questions or problems.

## 2022-05-23 ENCOUNTER — Other Ambulatory Visit: Payer: Self-pay | Admitting: Internal Medicine

## 2022-05-25 ENCOUNTER — Telehealth: Payer: Self-pay

## 2022-05-25 ENCOUNTER — Other Ambulatory Visit: Payer: Self-pay | Admitting: Internal Medicine

## 2022-05-25 NOTE — Telephone Encounter (Signed)
Prescription Request  05/25/2022  LOV: Visit date not found  What is the name of the medication or equipment? Iron pills  Have you contacted your pharmacy to request a refill? Yes   Which pharmacy would you like this sent to?  [facility] [place], [place] [address] AT [place] [place] [address] Phone: [phone] Fax: [phone]    Patient notified that their request is being sent to the clinical staff for review and that they should receive a response within 2 business days.   Please advise at Mobile [phone] (mobile)  Patient states the last time she saw Dr. Einar Pheasant, she wanted her to start taking iron pills.  Patient states she had some iron pills already at home, so she has been taking those.  Patient states she has a few pills left and she takes one every other day.  Patient states the date on the bottle is outdated.  Patient states her pharmacy is waiting for [place] to call them.

## 2022-05-25 NOTE — Telephone Encounter (Signed)
Called patient , unable to leave message

## 2022-05-25 NOTE — Telephone Encounter (Signed)
See other note

## 2022-05-25 NOTE — Telephone Encounter (Signed)
Patient says that she has been taking iron but getting OTC has not missed any doses. She takes one qod. Ok to send in refill for her?

## 2022-05-26 NOTE — Telephone Encounter (Signed)
Rx ok'd for iron refill

## 2022-05-31 DIAGNOSIS — H903 Sensorineural hearing loss, bilateral: Secondary | ICD-10-CM | POA: Diagnosis not present

## 2022-06-12 ENCOUNTER — Other Ambulatory Visit (INDEPENDENT_AMBULATORY_CARE_PROVIDER_SITE_OTHER): Payer: Medicare Other

## 2022-06-12 DIAGNOSIS — I10 Essential (primary) hypertension: Secondary | ICD-10-CM | POA: Diagnosis not present

## 2022-06-12 DIAGNOSIS — E7800 Pure hypercholesterolemia, unspecified: Secondary | ICD-10-CM | POA: Diagnosis not present

## 2022-06-12 DIAGNOSIS — E1149 Type 2 diabetes mellitus with other diabetic neurological complication: Secondary | ICD-10-CM

## 2022-06-12 DIAGNOSIS — D649 Anemia, unspecified: Secondary | ICD-10-CM | POA: Diagnosis not present

## 2022-06-12 LAB — LIPID PANEL
Cholesterol: 141 mg/dL (ref 0–200)
HDL: 63.4 mg/dL (ref >39.00)
LDL Cholesterol: 59 mg/dL (ref 0–99)
NonHDL: 78.09
Total CHOL/HDL Ratio: 2
Triglycerides: 97 mg/dL (ref 0.0–149.0)
VLDL: 19.4 mg/dL (ref 0.0–40.0)

## 2022-06-12 LAB — CBC WITH DIFFERENTIAL/PLATELET
Basophils Absolute: 0 10*3/uL (ref 0.0–0.1)
Basophils Relative: 0.5 % (ref 0.0–3.0)
Eosinophils Absolute: 0.2 10*3/uL (ref 0.0–0.7)
Eosinophils Relative: 2.5 % (ref 0.0–5.0)
HCT: 41.6 % (ref 36.0–46.0)
Hemoglobin: 13.9 g/dL (ref 12.0–15.0)
Lymphocytes Relative: 18.6 % (ref 12.0–46.0)
Lymphs Abs: 1.6 10*3/uL (ref 0.7–4.0)
MCHC: 33.4 g/dL (ref 30.0–36.0)
MCV: 87.9 fl (ref 78.0–100.0)
Monocytes Absolute: 1.5 10*3/uL — ABNORMAL HIGH (ref 0.1–1.0)
Monocytes Relative: 17.4 % — ABNORMAL HIGH (ref 3.0–12.0)
Neutro Abs: 5.1 10*3/uL (ref 1.4–7.7)
Neutrophils Relative %: 61 % (ref 43.0–77.0)
Platelets: 202 10*3/uL (ref 150.0–400.0)
RBC: 4.73 Mil/uL (ref 3.87–5.11)
RDW: 14.7 % (ref 11.5–15.5)
WBC: 8.4 10*3/uL (ref 4.0–10.5)

## 2022-06-12 LAB — BASIC METABOLIC PANEL
BUN: 18 mg/dL (ref 6–23)
CO2: 29 mEq/L (ref 19–32)
Calcium: 9.6 mg/dL (ref 8.4–10.5)
Chloride: 97 mEq/L (ref 96–112)
Creatinine, Ser: 0.82 mg/dL (ref 0.40–1.20)
GFR: 65.77 mL/min (ref >60.00)
Glucose, Bld: 106 mg/dL — ABNORMAL HIGH (ref 70–99)
Potassium: 4.8 mEq/L (ref 3.5–5.1)
Sodium: 134 mEq/L — ABNORMAL LOW (ref 135–145)

## 2022-06-12 LAB — FERRITIN: Ferritin: 101.8 ng/mL (ref 10.0–291.0)

## 2022-06-12 LAB — HEPATIC FUNCTION PANEL
ALT: 11 U/L (ref 0–35)
AST: 16 U/L (ref 0–37)
Albumin: 4.1 g/dL (ref 3.5–5.2)
Alkaline Phosphatase: 53 U/L (ref 39–117)
Bilirubin, Direct: 0.1 mg/dL (ref 0.0–0.3)
Total Bilirubin: 0.5 mg/dL (ref 0.2–1.2)
Total Protein: 6.5 g/dL (ref 6.0–8.3)

## 2022-06-12 LAB — HEMOGLOBIN A1C: Hgb A1c MFr Bld: 5.9 % (ref 4.6–6.5)

## 2022-06-14 ENCOUNTER — Ambulatory Visit: Payer: Medicare Other | Admitting: Internal Medicine

## 2022-06-21 ENCOUNTER — Ambulatory Visit (INDEPENDENT_AMBULATORY_CARE_PROVIDER_SITE_OTHER): Payer: Medicare Other | Admitting: Internal Medicine

## 2022-06-21 ENCOUNTER — Encounter: Payer: Self-pay | Admitting: Internal Medicine

## 2022-06-21 VITALS — BP 118/72 | HR 82 | Temp 98.2°F | Resp 16 | Ht 65.0 in | Wt 243.6 lb

## 2022-06-21 DIAGNOSIS — G319 Degenerative disease of nervous system, unspecified: Secondary | ICD-10-CM

## 2022-06-21 DIAGNOSIS — Z853 Personal history of malignant neoplasm of breast: Secondary | ICD-10-CM | POA: Diagnosis not present

## 2022-06-21 DIAGNOSIS — D649 Anemia, unspecified: Secondary | ICD-10-CM | POA: Diagnosis not present

## 2022-06-21 DIAGNOSIS — I10 Essential (primary) hypertension: Secondary | ICD-10-CM

## 2022-06-21 DIAGNOSIS — E1142 Type 2 diabetes mellitus with diabetic polyneuropathy: Secondary | ICD-10-CM

## 2022-06-21 DIAGNOSIS — E7800 Pure hypercholesterolemia, unspecified: Secondary | ICD-10-CM | POA: Diagnosis not present

## 2022-06-21 DIAGNOSIS — E1149 Type 2 diabetes mellitus with other diabetic neurological complication: Secondary | ICD-10-CM

## 2022-06-21 MED ORDER — PANTOPRAZOLE SODIUM 40 MG PO TBEC: 40.0000 mg | DELAYED_RELEASE_TABLET | Freq: Every day | ORAL | 3 refills | Status: DC

## 2022-06-21 MED ORDER — FERROUS SULFATE 325 (65 FE) MG PO TABS: 325.0000 mg | ORAL_TABLET | ORAL | 3 refills | Status: DC

## 2022-06-21 MED ORDER — SIMVASTATIN 10 MG PO TABS: 10.0000 mg | ORAL_TABLET | Freq: Every day | ORAL | 3 refills | Status: DC

## 2022-06-21 MED ORDER — AMLODIPINE BESYLATE 2.5 MG PO TABS: 2.5000 mg | ORAL_TABLET | Freq: Every day | ORAL | 3 refills | Status: DC

## 2022-06-21 NOTE — Progress Notes (Signed)
Subjective:    [name], female    DOB: 1940/08/06, 82 y.o.   MRN: [id number]  Patient here for  Chief Complaint  Patient presents with   Medical Management of Chronic Issues    HPI Here to follow up regarding diabetes, hypertension and hypercholesterolemia. She is accompanied by her caretaker. History obtained from both of them. She is doing well.  Feels good.  No chest pain reported.  Breathing stable..  no cough or congestion.  No nausea or vomiting.  No abdominal pain.  Bowels moving.  Did notice some soreness and redness - 2nd, 3rd and fourth toe - left foot - end of last week.  Better now.  Does have pressure area - fourth toe.  Sees podiatry.  Discussed f/u appt.    Past Medical History:  Diagnosis Date   Anemia    Arthritis    Atrial fibrillation (HCC)    Breast cancer (HCC)    s/p lumpectomy 1992.  s/p chemo and xrt left breast   Diabetes mellitus (HCC)    Edema    feet/legs   Gastric ulcer    GERD (gastroesophageal reflux disease)    HOH (hard of hearing)    aides   Hypercholesterolemia    Hypertension    Personal history of chemotherapy    Pulmonary emboli [facility])    Past Surgical History:  Procedure Laterality Date   BREAST LUMPECTOMY  03/06/1990   left breast   CATARACT EXTRACTION W/PHACO Right 12/26/2016   Procedure: CATARACT EXTRACTION PHACO AND INTRAOCULAR LENS PLACEMENT (IOC)-RIGHT DIABETIC;  Surgeon: Galen Manila, MD;  Location: [facility] ORS;  Service: Ophthalmology;  Laterality: Right;  [place] 00:47 AP% 24.5 CDE 11.62 Fluid pack lot # [id number]   CATARACT EXTRACTION W/PHACO Left 01/23/2017   Procedure: CATARACT EXTRACTION PHACO AND INTRAOCULAR LENS PLACEMENT (IOC);  Surgeon: Galen Manila, MD;  Location: [facility] ORS;  Service: Ophthalmology;  Laterality: Left;  [place] 00:50 AP% 16.1 CDE 8.18 Fluid pack lot #[id number]   COLONOSCOPY WITH PROPOFOL N/A 07/08/2019   Procedure: COLONOSCOPY WITH PROPOFOL;  Surgeon: Midge Minium, MD;  [facility]: [facility]  ENDOSCOPY;  Service: Endoscopy;  Laterality: N/A;   Family History  Problem Relation Age of Onset   Heart disease Father    Heart disease Brother        s/p CABG   Breast cancer Daughter    Colon cancer Neg Hx    Social History   Socioeconomic History   Marital status: Widowed    Spouse name: Not on file   Number of children: Not on file   Years of education: Not on file   Highest education level: Not on file  Occupational History   Not on file  Tobacco Use   Smoking status: Never   Smokeless tobacco: Never  Vaping Use   Vaping Use: Never used  Substance and Sexual Activity   Alcohol use: No    Alcohol/week: 0.0 standard drinks of alcohol   Drug use: No   Sexual activity: Never  Other Topics Concern   Not on file  Social History Narrative   Not on file   Social Determinants of Health   Financial Resource Strain: Low Risk  (05/05/2022)   Overall Financial Resource Strain (CARDIA)    Difficulty of Paying Living Expenses: Not hard at all  Food Insecurity: No Food Insecurity (05/05/2022)   Hunger Vital Sign    Worried About Running Out of Food in the Last Year: Never true  Ran Out of Food in the Last Year: Never true  Transportation Needs: No Transportation Needs (05/05/2022)   PRAPARE - Administrator, Civil Service (Medical): No    Lack of Transportation (Non-Medical): No  Physical Activity: Insufficiently Active (05/05/2022)   Exercise Vital Sign    Days of Exercise per Week: 7 days    Minutes of Exercise per Session: 20 min  Stress: No Stress Concern Present (05/05/2022)   [employer] of Occupational Health - Occupational Stress Questionnaire    Feeling of Stress : Not at all  Social Connections: Unknown (05/05/2022)   Social Connection and Isolation Panel [NHANES]    Frequency of Communication with Friends and Family: More than three times a week    Frequency of Social Gatherings with Friends and Family: More than three times a week    Attends  Religious Services: Not on Marketing executive or Organizations: Not on file    Attends Banker Meetings: Not on file    Marital Status: Not on file     Review of Systems  Constitutional:  Negative for appetite change and unexpected weight change.  HENT:  Negative for congestion and sinus pressure.   Respiratory:  Negative for cough, chest tightness and shortness of breath.   Cardiovascular:  Negative for chest pain and palpitations.       No increased swelling.   Gastrointestinal:  Negative for abdominal pain, diarrhea, nausea and vomiting.  Genitourinary:  Negative for difficulty urinating and dysuria.  Musculoskeletal:  Negative for joint swelling and myalgias.  Skin:  Negative for color change and rash.  Neurological:  Negative for dizziness and headaches.  Psychiatric/Behavioral:  Negative for agitation and dysphoric mood.        Objective:     BP 118/72   Pulse 82   Temp 98.2 F (36.8 C)   Resp 16   Ht 5\' 5"  (1.651 m)   Wt 243 lb 9.6 oz (110.5 kg)   SpO2 97%   BMI 40.54 kg/m  Wt Readings from Last 3 Encounters:  06/21/22 243 lb 9.6 oz (110.5 kg)  05/05/22 247 lb (112 kg)  04/26/22 247 lb 6.4 oz (112.2 kg)    Physical Exam Vitals reviewed.  Constitutional:      General: She is not in acute distress.    Appearance: Normal appearance.  HENT:     Head: Normocephalic and atraumatic.     Right Ear: External ear normal.     Left Ear: External ear normal.  Eyes:     General: No scleral icterus.       Right eye: No discharge.        Left eye: No discharge.     Conjunctiva/sclera: Conjunctivae normal.  Neck:     Thyroid: No thyromegaly.  Cardiovascular:     Rate and Rhythm: Normal rate and regular rhythm.  Pulmonary:     Effort: No respiratory distress.     Breath sounds: Normal breath sounds. No wheezing.  Abdominal:     General: Bowel sounds are normal.     Palpations: Abdomen is soft.     Tenderness: There is no abdominal  tenderness.  Musculoskeletal:        General: No swelling or tenderness.     Cervical back: Neck supple. No tenderness.  Lymphadenopathy:     Cervical: No cervical adenopathy.  Skin:    Findings: No erythema or rash.     Comments: Pressure -  thickened skin - fourth toe. No increased erythema or pain.   Neurological:     Mental Status: She is alert.  Psychiatric:        Mood and Affect: Mood normal.        Behavior: Behavior normal.      Outpatient Encounter Medications as of 06/21/2022  Medication Sig   amLODipine (NORVASC) 2.5 MG tablet Take 1 tablet (2.5 mg total) by mouth daily.   diphenhydrAMINE (BENADRYL) 25 mg capsule Take 25 mg at bedtime as needed by mouth for sleep.    feeding supplement (ENSURE ENLIVE / ENSURE PLUS) LIQD Take 237 mLs by mouth 2 (two) times daily between meals.   ferrous sulfate (FEROSUL) 325 (65 FE) MG tablet Take 1 tablet (325 mg total) by mouth every other day. One tablet qod   glucose blood (CONTOUR TEST) test strip USE TO TEST BLOOD SUGAR ONCE DAILY   losartan (COZAAR) 100 MG tablet TAKE 1 TABLET(100 MG) BY MOUTH DAILY   Multiple Vitamin (MULTIVITAMIN WITH MINERALS) TABS tablet Take 1 tablet daily by mouth.   pantoprazole (PROTONIX) 40 MG tablet Take 1 tablet (40 mg total) by mouth daily.   simvastatin (ZOCOR) 10 MG tablet Take 1 tablet (10 mg total) by mouth daily.   [DISCONTINUED] amLODipine (NORVASC) 2.5 MG tablet TAKE 1 TABLET(2.5 MG) BY MOUTH DAILY   [DISCONTINUED] ferrous sulfate (FEROSUL) 325 (65 FE) MG tablet One tablet qod   [DISCONTINUED] pantoprazole (PROTONIX) 40 MG tablet TAKE 1 TABLET(40 MG) BY MOUTH DAILY   [DISCONTINUED] simvastatin (ZOCOR) 10 MG tablet TAKE 1 TABLET BY MOUTH EVERY DAY   No facility-administered encounter medications on file as of 06/21/2022.     Lab Results  Component Value Date   WBC 8.4 06/12/2022   HGB 13.9 06/12/2022   HCT 41.6 06/12/2022   PLT 202.0 06/12/2022   GLUCOSE 106 (H) 06/12/2022   CHOL 141  06/12/2022   TRIG 97.0 06/12/2022   HDL 63.40 06/12/2022   LDLCALC 59 06/12/2022   ALT 11 06/12/2022   AST 16 06/12/2022   NA 134 (L) 06/12/2022   K 4.8 06/12/2022   CL 97 06/12/2022   CREATININE 0.82 06/12/2022   BUN 18 06/12/2022   CO2 29 06/12/2022   TSH 3.86 03/01/2022   INR 1.2 (H) 07/22/2020   HGBA1C 5.9 06/12/2022   MICROALBUR 0.9 10/26/2021    MM 3D SCREEN BREAST BILATERAL  Result Date: 12/29/2021 CLINICAL DATA:  Screening. EXAM: DIGITAL SCREENING BILATERAL MAMMOGRAM WITH TOMOSYNTHESIS AND CAD TECHNIQUE: Bilateral screening digital craniocaudal and mediolateral oblique mammograms were obtained. Bilateral screening digital breast tomosynthesis was performed. The images were evaluated with computer-aided detection. COMPARISON:  Previous exam(s). ACR Breast Density Category b: There are scattered areas of fibroglandular density. FINDINGS: There are no findings suspicious for malignancy. IMPRESSION: No mammographic evidence of malignancy. A result letter of this screening mammogram will be mailed directly to the patient. RECOMMENDATION: Screening mammogram in one year. (Code:SM-B-01Y) BI-RADS CATEGORY  1: Negative. Electronically Signed   By: Amie Portland M.D.   On: 12/29/2021 16:10       Assessment & Plan:  Type 2 diabetes mellitus with neurological complications (HCC) Assessment & Plan: Low carb diet and exercise as tolerated.  Follow met b and a1c.   Orders: -     Hemoglobin A1c; Future  Pure hypercholesterolemia -     Lipid panel; Future -     Hepatic function panel; Future -     Basic metabolic panel; Future  Anemia, unspecified type Assessment & Plan: Recent hgb wnl.  Ferritin wnl.  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  Mild.  Stable.    Diabetic polyneuropathy associated with type 2 diabetes mellitus (HCC) Assessment & Plan: Sugars have been well controlled.  Continue low carb diet and exercise.   Continue to use walker for ambulation.  Follow met b and a1c.    History of breast cancer Assessment & Plan: Mammogram 12/28/21 - birads I.    Hypercholesteremia Assessment & Plan: On simvastatin.  Low cholesterol diet and exercise.  Follow lipid panel and liver function tests.     Primary hypertension Assessment & Plan: Continue losartan and amlodipine.  Follow pressures.    Other orders -     amLODIPine Besylate; Take 1 tablet (2.5 mg total) by mouth daily.  Dispense: 90 tablet; Refill: 3 -     Ferrous Sulfate; Take 1 tablet (325 mg total) by mouth every other day. One tablet qod  Dispense: 45 tablet; Refill: 3 -     Pantoprazole Sodium; Take 1 tablet (40 mg total) by mouth daily.  Dispense: 90 tablet; Refill: 3 -     Simvastatin; Take 1 tablet (10 mg total) by mouth daily.  Dispense: 90 tablet; Refill: 3     Dale [place], MD

## 2022-07-02 ENCOUNTER — Encounter: Payer: Self-pay | Admitting: Internal Medicine

## 2022-07-02 NOTE — Assessment & Plan Note (Signed)
On simvastatin.  Low cholesterol diet and exercise.  Follow lipid panel and liver function tests.   

## 2022-07-02 NOTE — Assessment & Plan Note (Signed)
Low carb diet and exercise as tolerated.  Follow met b and a1c.   

## 2022-07-02 NOTE — Assessment & Plan Note (Signed)
Found on previous CT head.  Mild.  Stable.  

## 2022-07-02 NOTE — Assessment & Plan Note (Signed)
Sugars have been well controlled.  Continue low carb diet and exercise.  Continue to use walker for ambulation.  Follow met b and a1c.  ?

## 2022-07-02 NOTE — Assessment & Plan Note (Signed)
Continue losartan and amlodipine.  Follow pressures.  

## 2022-07-02 NOTE — Assessment & Plan Note (Signed)
Recent hgb wnl.  Ferritin wnl.  Follow cbc and ferritin

## 2022-07-02 NOTE — Assessment & Plan Note (Signed)
Mammogram 12/28/21 - birads I.  

## 2022-07-24 ENCOUNTER — Encounter: Payer: Self-pay | Admitting: Podiatry

## 2022-07-24 ENCOUNTER — Ambulatory Visit (INDEPENDENT_AMBULATORY_CARE_PROVIDER_SITE_OTHER): Payer: Medicare Other | Admitting: Podiatry

## 2022-07-24 DIAGNOSIS — L84 Corns and callosities: Secondary | ICD-10-CM | POA: Diagnosis not present

## 2022-07-24 DIAGNOSIS — B351 Tinea unguium: Secondary | ICD-10-CM | POA: Diagnosis not present

## 2022-07-24 DIAGNOSIS — M79675 Pain in left toe(s): Secondary | ICD-10-CM | POA: Diagnosis not present

## 2022-07-24 DIAGNOSIS — M79674 Pain in right toe(s): Secondary | ICD-10-CM

## 2022-07-24 DIAGNOSIS — E0843 Diabetes mellitus due to underlying condition with diabetic autonomic (poly)neuropathy: Secondary | ICD-10-CM

## 2022-07-24 NOTE — Progress Notes (Signed)
  Subjective:  [name], female    DOB: 06-21-1940,  MRN: [id number]  Anita Walker presents to clinic today for at risk foot care with history of diabetic neuropathy and painful thick toenails that are difficult to trim. Pain interferes with ambulation. Aggravating factors include wearing enclosed shoe gear. Pain is relieved with periodic professional debridement.: Chief Complaint  Patient presents with   Nail Problem    DFC,Referring Provider Dale [place], MD,LOV:04/24,A1C:5.9,B/S: only sticks on Wed.      Patient is accompanied by her caregiver on today's visit. Patient c/o painful left 4th toe where she has a lesion on top of her toe. They deny any redness, drainage or swelling.  PCP is Dale [place], [place].  Allergies  Allergen Reactions   Penicillins Other (See Comments)    Unknown- pt states been a long time ago  Has patient had a PCN reaction causing immediate rash, facial/tongue/throat swelling, SOB or lightheadedness with hypotension: Unknown Has patient had a PCN reaction causing severe rash involving mucus membranes or skin necrosis: Unknown Has patient had a PCN reaction that required hospitalization: Unknown Has patient had a PCN reaction occurring within the last 10 years: Unknown If all of the above answers are "NO", then may proceed with Cephalosporin use.   Penicillin V Potassium Nausea And Vomiting    Review of Systems: Negative except as noted in the HPI.  Objective: No changes noted in today's physical examination. There were no vitals filed for this visit.  Anita Walker is a pleasant 82 y.o. female in NAD. AAO x 3.  Vascular Examination: Capillary refill time <3 seconds b/l LE. Palpable pedal pulses b/l LE. Digital hair absent b/l. No pedal edema b/l. Skin temperature gradient WNL b/l. Mild varicosities b/l LE.Marland Kitchen  Dermatological Examination: Pedal skin with normal turgor, texture and tone b/l. No open wounds. No interdigital  macerations b/l. Toenails 1-5 b/l thickened, discolored, dystrophic with subungual debris. There is pain on palpation to dorsal aspect of nailplates. Hyperkeratotic lesion(s) dorsal PIPJ of bilateral 4th toes.  No erythema, no edema, no drainage, no fluctuance..  Neurological Examination: Protective sensation intact with 10 gram monofilament b/l LE. Vibratory sensation intact b/l LE.   Musculoskeletal Examination: Muscle strength 5/5 to all LE muscle groups b/l. 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     Latest Ref Rng & Units 06/12/2022   10:33 AM 03/01/2022   10:01 AM 10/26/2021    9:32 AM  Hemoglobin A1C  Hemoglobin-A1c 4.6 - 6.5 % 5.9  5.7  5.9    Assessment/Plan: 1. Pain due to onychomycosis of toenails of both feet   2. Corns   3. Diabetes mellitus due to underlying condition with diabetic autonomic neuropathy, unspecified whether long term insulin use (HCC)     -Caregiver/provider present with patient on today's visit. -Discussed corn formation b/l 4th toes; advised conservative treatment such as padding and shoes with soft, stretchable uppers. -Patient to continue soft, supportive shoe gear daily. -Toenails 1-5 b/l were debrided in length and girth with sterile nail nippers and dremel without iatrogenic bleeding.  -Corn(s) left fourth digit and right fourth digit pared utilizing sterile scalpel blade without complication or incident. Total number debrided=2. -Dispensed tube foam. Apply to bilateral 4th toes every morning. Remove every evening. -Patient/POA to call should there be question/concern in the interim.   Return in about 3 months (around 10/24/2022).  Freddie Breech, DPM

## 2022-08-16 ENCOUNTER — Encounter: Payer: Self-pay | Admitting: Nurse Practitioner

## 2022-08-16 ENCOUNTER — Other Ambulatory Visit: Payer: Self-pay | Admitting: Internal Medicine

## 2022-08-16 ENCOUNTER — Ambulatory Visit (INDEPENDENT_AMBULATORY_CARE_PROVIDER_SITE_OTHER): Payer: Medicare Other | Admitting: Nurse Practitioner

## 2022-08-16 VITALS — BP 138/80 | HR 84 | Temp 97.7°F | Ht 65.0 in | Wt 246.0 lb

## 2022-08-16 DIAGNOSIS — R3915 Urgency of urination: Secondary | ICD-10-CM | POA: Diagnosis not present

## 2022-08-16 DIAGNOSIS — R3911 Hesitancy of micturition: Secondary | ICD-10-CM | POA: Diagnosis not present

## 2022-08-16 LAB — POC URINALSYSI DIPSTICK (AUTOMATED)
Bilirubin, UA: NEGATIVE
Blood, UA: NEGATIVE
Glucose, UA: NEGATIVE
Ketones, UA: NEGATIVE
Nitrite, UA: NEGATIVE
Protein, UA: NEGATIVE
Spec Grav, UA: 1.01 (ref 1.010–1.025)
Urobilinogen, UA: 0.2 E.U./dL
pH, UA: 6.5 (ref 5.0–8.0)

## 2022-08-16 MED ORDER — NITROFURANTOIN MONOHYD MACRO 100 MG PO CAPS
100.0000 mg | ORAL_CAPSULE | Freq: Two times a day (BID) | ORAL | 0 refills | Status: DC
Start: 2022-08-16 — End: 2022-09-01

## 2022-08-16 NOTE — Progress Notes (Signed)
Bethanie Dicker, NP-C Phone: [phone]  Anita Walker is a 82 y.o. female who presents today for urinary urgency.  Patient present with her caretaker. She reports noticing on Sunday an increased urge to use the restroom but when she would use the restroom she would not have much output. She has had increased nausea. Denies vomiting. She is drinking plenty of fluids. She feels that she is using the restroom more frequently. She does wear a diaper when out of the house and overnight. Her caretaker reports increased agitation.   UTI:  Dysuria- No  Frequency- Yes   Urgency- Yes   Hematuria- No   Fever- No  Abd pain- No   Vaginal d/c- No   Social History   Tobacco Use  Smoking Status Never  Smokeless Tobacco Never    Current Outpatient Medications on File Prior to Visit  Medication Sig Dispense Refill   amLODipine (NORVASC) 2.5 MG tablet Take 1 tablet (2.5 mg total) by mouth daily. 90 tablet 3   diphenhydrAMINE (BENADRYL) 25 mg capsule Take 25 mg at bedtime as needed by mouth for sleep.      feeding supplement (ENSURE ENLIVE / ENSURE PLUS) LIQD Take 237 mLs by mouth 2 (two) times daily between meals. 237 mL 12   ferrous sulfate (FEROSUL) 325 (65 FE) MG tablet Take 1 tablet (325 mg total) by mouth every other day. One tablet qod 45 tablet 3   glucose blood (CONTOUR TEST) test strip USE TO TEST BLOOD SUGAR ONCE DAILY 50 each 1   losartan (COZAAR) 100 MG tablet TAKE 1 TABLET(100 MG) BY MOUTH DAILY 90 tablet 3   Multiple Vitamin (MULTIVITAMIN WITH MINERALS) TABS tablet Take 1 tablet daily by mouth.     pantoprazole (PROTONIX) 40 MG tablet Take 1 tablet (40 mg total) by mouth daily. 90 tablet 3   simvastatin (ZOCOR) 10 MG tablet Take 1 tablet (10 mg total) by mouth daily. 90 tablet 3   No current facility-administered medications on file prior to visit.     ROS see history of present illness  Objective  Physical Exam Vitals:   08/16/22 1113  BP: 138/80  Pulse: 84  Temp: 97.7  F (36.5 C)  SpO2: 96%    BP Readings from Last 3 Encounters:  08/16/22 138/80  06/21/22 118/72  04/26/22 130/78   Wt Readings from Last 3 Encounters:  08/16/22 246 lb (111.6 kg)  06/21/22 243 lb 9.6 oz (110.5 kg)  05/05/22 247 lb (112 kg)    Physical Exam Constitutional:      General: She is not in acute distress.    Appearance: Normal appearance.  HENT:     Head: Normocephalic.  Cardiovascular:     Rate and Rhythm: Normal rate and regular rhythm.     Heart sounds: Normal heart sounds.  Pulmonary:     Effort: Pulmonary effort is normal.     Breath sounds: Normal breath sounds.  Skin:    General: Skin is warm and dry.  Neurological:     General: No focal deficit present.     Mental Status: She is alert.  Psychiatric:        Mood and Affect: Mood normal.        Behavior: Behavior normal.    Assessment/Plan: Please see individual problem list.  Urinary urgency Assessment & Plan: UA in office with leukocytes only. Culture pending. Symptoms consistent with cystitis. Will go ahead and treat with Macrobid 100 mg BID x 5 days. Will contact  with culture results and change antibiotic if necessary. Encouraged to continue adequate fluid intake. Return precautions given to patient and caretaker.   Orders: -     POCT Urinalysis Dipstick (Automated) -     Urine Culture -     Nitrofurantoin Monohyd Macro; Take 1 capsule (100 mg total) by mouth 2 (two) times daily.  Dispense: 10 capsule; Refill: 0    Return if symptoms worsen or fail to improve.   Bethanie Dicker, NP-C [facility] [employer]

## 2022-08-16 NOTE — Assessment & Plan Note (Addendum)
UA in office with leukocytes only. Culture pending. Symptoms consistent with cystitis. Will go ahead and treat with Macrobid 100 mg BID x 5 days. Will contact with culture results and change antibiotic if necessary. Encouraged to continue adequate fluid intake. Return precautions given to patient and caretaker.

## 2022-08-17 ENCOUNTER — Emergency Department: Payer: Medicare Other

## 2022-08-17 ENCOUNTER — Other Ambulatory Visit: Payer: Self-pay

## 2022-08-17 ENCOUNTER — Emergency Department
Admission: EM | Admit: 2022-08-17 | Discharge: 2022-08-18 | Disposition: A | Discharge status: Home / Self Care | Payer: Medicare Other | Attending: Emergency Medicine | Admitting: Emergency Medicine

## 2022-08-17 DIAGNOSIS — J209 Acute bronchitis, unspecified: Secondary | ICD-10-CM | POA: Diagnosis not present

## 2022-08-17 DIAGNOSIS — R197 Diarrhea, unspecified: Secondary | ICD-10-CM | POA: Diagnosis not present

## 2022-08-17 DIAGNOSIS — R0602 Shortness of breath: Secondary | ICD-10-CM

## 2022-08-17 DIAGNOSIS — J449 Chronic obstructive pulmonary disease, unspecified: Secondary | ICD-10-CM | POA: Insufficient documentation

## 2022-08-17 DIAGNOSIS — K449 Diaphragmatic hernia without obstruction or gangrene: Secondary | ICD-10-CM | POA: Diagnosis not present

## 2022-08-17 DIAGNOSIS — R0902 Hypoxemia: Secondary | ICD-10-CM | POA: Diagnosis not present

## 2022-08-17 DIAGNOSIS — J40 Bronchitis, not specified as acute or chronic: Secondary | ICD-10-CM | POA: Diagnosis not present

## 2022-08-17 DIAGNOSIS — R531 Weakness: Secondary | ICD-10-CM | POA: Diagnosis not present

## 2022-08-17 DIAGNOSIS — R110 Nausea: Secondary | ICD-10-CM | POA: Diagnosis not present

## 2022-08-17 LAB — URINE CULTURE
MICRO NUMBER:: 15073540
SPECIMEN QUALITY:: ADEQUATE

## 2022-08-17 LAB — CBC
HCT: 42.7 % (ref 36.0–46.0)
Hemoglobin: 14.7 g/dL (ref 12.0–15.0)
MCH: 29.6 pg (ref 26.0–34.0)
MCHC: 34.4 g/dL (ref 30.0–36.0)
MCV: 86.1 fL (ref 80.0–100.0)
Platelets: 212 10*3/uL (ref 150–400)
RBC: 4.96 MIL/uL (ref 3.87–5.11)
RDW: 14.1 % (ref 11.5–15.5)
WBC: 11.4 10*3/uL — ABNORMAL HIGH (ref 4.0–10.5)
nRBC: 0 % (ref 0.0–0.2)

## 2022-08-17 LAB — HEPATIC FUNCTION PANEL
ALT: 16 U/L (ref 0–44)
AST: 23 U/L (ref 15–41)
Albumin: 4.1 g/dL (ref 3.5–5.0)
Alkaline Phosphatase: 48 U/L (ref 38–126)
Bilirubin, Direct: 0.2 mg/dL (ref 0.0–0.2)
Indirect Bilirubin: 0.8 mg/dL (ref 0.3–0.9)
Total Bilirubin: 1 mg/dL (ref 0.3–1.2)
Total Protein: 6.9 g/dL (ref 6.5–8.1)

## 2022-08-17 LAB — BASIC METABOLIC PANEL
Anion gap: 12 (ref 5–15)
BUN: 13 mg/dL (ref 8–23)
CO2: 23 mmol/L (ref 22–32)
Calcium: 9.2 mg/dL (ref 8.9–10.3)
Chloride: 94 mmol/L — ABNORMAL LOW (ref 98–111)
Creatinine, Ser: 0.66 mg/dL (ref 0.44–1.00)
GFR, Estimated: >60 mL/min (ref >60)
Glucose, Bld: 170 mg/dL — ABNORMAL HIGH (ref 70–99)
Potassium: 4.2 mmol/L (ref 3.5–5.1)
Sodium: 129 mmol/L — ABNORMAL LOW (ref 135–145)

## 2022-08-17 LAB — LIPASE, BLOOD: Lipase: 30 U/L (ref 11–51)

## 2022-08-17 LAB — TROPONIN I (HIGH SENSITIVITY): Troponin I (High Sensitivity): 17 ng/L (ref <18)

## 2022-08-17 NOTE — ED Notes (Signed)
First Nurse Note: Patient to ED via [facility] from home. Patient was dx with UTI yesterday and took first antibiotic today and has not seen improvement. Having nausea and diarrhea.   20 R forearm- given 4mg  zofran with EMS

## 2022-08-17 NOTE — ED Triage Notes (Addendum)
Pt presents to ED via EMS with c/o of weakness and possible UTI due to nausea. Daughter states pt's UTI's usually start with nausea. Pt denies fevers or chills. Pt currently on macrobid and has taken 3 doses, pt Rx'ed macrobid yesterday by PCP. Pt does complain of weakness and states EMS was called due to not being able to walk down the stairs. Pt is A&Ox4. NAD noted.  Pt's 02 sat was 90% on RA, pt placed on 2L/min via [place] for comfort. Pt denies SOB. Pt states slight cough. Pt speaking in full sentences with no distress noted.

## 2022-08-18 DIAGNOSIS — J209 Acute bronchitis, unspecified: Secondary | ICD-10-CM | POA: Diagnosis not present

## 2022-08-18 LAB — URINALYSIS, ROUTINE W REFLEX MICROSCOPIC
Bilirubin Urine: NEGATIVE
Glucose, UA: NEGATIVE mg/dL
Hgb urine dipstick: NEGATIVE
Ketones, ur: 5 mg/dL — AB
Leukocytes,Ua: NEGATIVE
Nitrite: NEGATIVE
Protein, ur: 100 mg/dL — AB
Specific Gravity, Urine: 1.02 (ref 1.005–1.030)
pH: 5 (ref 5.0–8.0)

## 2022-08-18 MED ORDER — DOXYCYCLINE HYCLATE 50 MG PO CAPS: 100.0000 mg | ORAL_CAPSULE | Freq: Two times a day (BID) | ORAL | 0 refills | Status: AC

## 2022-08-18 MED ORDER — PREDNISONE 10 MG (21) PO TBPK: ORAL_TABLET | ORAL | 0 refills | Status: DC

## 2022-08-18 MED ORDER — ALBUTEROL SULFATE HFA 108 (90 BASE) MCG/ACT IN AERS: 2.0000 | INHALATION_SPRAY | Freq: Four times a day (QID) | RESPIRATORY_TRACT | 2 refills | Status: DC | PRN

## 2022-08-18 MED ORDER — AEROCHAMBER MV MISC: 0 refills | Status: DC

## 2022-08-18 MED ORDER — METHYLPREDNISOLONE SODIUM SUCC 125 MG IJ SOLR
125.0000 mg | Freq: Once | INTRAMUSCULAR | Status: AC
  Administered 2022-08-18: 125 mg via INTRAVENOUS
  Filled 2022-08-18: qty 2

## 2022-08-18 MED ORDER — IPRATROPIUM-ALBUTEROL 0.5-2.5 (3) MG/3ML IN SOLN
3.0000 mL | Freq: Once | RESPIRATORY_TRACT | Status: AC
  Administered 2022-08-18: 3 mL via RESPIRATORY_TRACT
  Filled 2022-08-18: qty 3

## 2022-08-18 NOTE — Discharge Instructions (Addendum)
Please stop taking your Macrobid and begin taking doxycycline

## 2022-08-18 NOTE — ED Provider Notes (Addendum)
[facility] Provider Note   Event Date/Time   First MD Initiated Contact with Patient 08/17/22 2306     (approximate) History  Weakness  HPI Anita Walker is a 82 y.o. female with a stated past medical history of COPD presents complaining of generalized weakness and possible UTI.  Patient states she is currently on Macrobid and is only taken 3 doses after being prescribed by her primary care provider yesterday.  Patient states that she has also been having nausea which is a normal associate symptom of UTIs for her.  Patient states that she has been able to walk down the stairs over the last 24 hours.  Patient does arrive via EMS who noted patient to be slightly hypoxic to 90% on room air and placed on 2 L nasal cannula.  Patient does endorse mild shortness of breath and a mild cough that has been nonproductive over the last 2 days ROS: Patient currently denies any vision changes, tinnitus, difficulty speaking, facial droop, sore throat, chest pain, abdominal pain, nausea/vomiting/diarrhea, or weakness/numbness/paresthesias in any extremity   Physical Exam  Triage Vital Signs: ED Triage Vitals  Enc Vitals Group     BP 08/17/22 1829 131/72     Pulse Rate 08/17/22 1829 98     Resp 08/17/22 1829 16     Temp 08/17/22 1829 97.9 F (36.6 C)     Temp Source 08/17/22 1829 Oral     SpO2 08/17/22 1829 90 %     Weight --      Height --      Head Circumference --      Peak Flow --      Pain Score 08/17/22 1836 0     Pain Loc --      Pain Edu? --      Excl. in GC? --    Most recent vital signs: Vitals:   08/18/22 0330 08/18/22 0337  BP: 135/67   Pulse: 75 85  Resp: 20   Temp:    SpO2: (!) 89% 94%   General: Awake, oriented x4. CV:  Good peripheral perfusion.  Resp:  Mildly increased effort.  Expiratory wheezes over bilateral lung fields Abd:  No distention.  Other:  Elderly obese Caucasian female laying in bed in no acute distress ED Results / Procedures /  Treatments  Labs (all labs ordered are listed, but only abnormal results are displayed) Labs Reviewed  BASIC METABOLIC PANEL - Abnormal; Notable for the following components:      Result Value   Sodium 129 (*)    Chloride 94 (*)    Glucose, Bld 170 (*)    All other components within normal limits  CBC - Abnormal; Notable for the following components:   WBC 11.4 (*)    All other components within normal limits  URINALYSIS, ROUTINE W REFLEX MICROSCOPIC - Abnormal; Notable for the following components:   Color, Urine AMBER (*)    APPearance HAZY (*)    Ketones, ur 5 (*)    Protein, ur 100 (*)    Bacteria, UA RARE (*)    All other components within normal limits  HEPATIC FUNCTION PANEL  LIPASE, BLOOD  CBG MONITORING, ED  TROPONIN I (HIGH SENSITIVITY)   EKG ED ECG REPORT I, Merwyn Katos, the attending physician, personally viewed and interpreted this ECG. Date: 08/18/2022 EKG Time: 1842 Rate: 97 Rhythm: normal sinus rhythm QRS Axis: normal Intervals: normal ST/T Wave abnormalities: normal Narrative Interpretation: no evidence  of acute ischemia RADIOLOGY ED MD interpretation: 2 view chest x-ray interpreted by me shows no evidence of acute abnormalities including no pneumonia, pneumothorax, or widened mediastinum -Agree with radiology assessment Official radiology report(s): No results found. PROCEDURES: Critical Care performed: No .1-3 Lead EKG Interpretation  Performed by: Merwyn Katos, MD Authorized by: Merwyn Katos, MD     Interpretation: normal     ECG rate:  71   ECG rate assessment: normal     Rhythm: sinus rhythm     Ectopy: none     Conduction: normal    MEDICATIONS ORDERED IN ED: Medications  ipratropium-albuterol (DUONEB) 0.5-2.5 (3) MG/3ML nebulizer solution 3 mL (3 mLs Nebulization Given 08/18/22 0211)  methylPREDNISolone sodium succinate (SOLU-MEDROL) 125 mg/2 mL injection 125 mg (125 mg Intravenous Given 08/18/22 0209)   IMPRESSION / MDM /  ASSESSMENT AND PLAN / ED COURSE  I reviewed the triage vital signs and the nursing notes.                             The patient is on the cardiac monitor to evaluate for evidence of arrhythmia and/or significant heart rate changes. Patient's presentation is most consistent with acute presentation with potential threat to life or bodily function. The patient appears to be suffering from a moderate exacerbation of COPD.  Based on the history, exam, CXR/EKG, and further workup I dont suspect any other emergent cause of this presentation, such as pneumonia, acute coronary syndrome, congestive heart failure, pulmonary embolism, or pneumothorax.  ED Interventions: bronchodilators, steroids, antibiotics, reassess  Reassessment: After treatment, the patients shortness of breath is resolved, and their lung exam has returned to baseline. They are comfortable and want to go home.  Rx: Steroids, Antibiotics, Albuterol Disposition: Discharge home with SRP. PCP follow up recommended in next 48hours.   FINAL CLINICAL IMPRESSION(S) / ED DIAGNOSES   Final diagnoses:  Shortness of breath  Acute bronchitis with wheezing  Generalized weakness   Rx / DC Orders   ED Discharge Orders          Ordered    albuterol (VENTOLIN HFA) 108 (90 Base) MCG/ACT inhaler  Every 6 hours PRN        08/18/22 0336    Spacer/Aero-Holding Chambers (AEROCHAMBER MV) inhaler        08/18/22 0336    predniSONE (STERAPRED UNI-PAK 21 TAB) 10 MG (21) TBPK tablet        08/18/22 0336    doxycycline (VIBRAMYCIN) 50 MG capsule  2 times daily        08/18/22 7846           Note:  This document was prepared using Dragon voice recognition software and may include unintentional dictation errors.   Merwyn Katos, MD 08/18/22 2340    Merwyn Katos, MD 08/18/22 [phone]

## 2022-08-18 NOTE — ED Notes (Signed)
Urine sent to lab at this time.

## 2022-08-18 NOTE — ED Notes (Signed)
Pt O2 sat 95-97% while awake. Pt sats 89-92% when sleeping, pt sleeping with mouth open.

## 2022-08-23 ENCOUNTER — Telehealth: Payer: Self-pay | Admitting: *Deleted

## 2022-08-23 NOTE — Telephone Encounter (Signed)
Transition Care Management Follow-up Telephone Call Date of discharge and from where: [facility] 08/23/2022 How have you been since you were released from the hospital? Feeling good  Any questions or concerns? No  Items Reviewed: Did the pt receive and understand the discharge instructions provided? Yes  Medications obtained and verified? No  Other? No  Any new allergies since your discharge? No  Dietary orders reviewed? No Do you have support at home? Yes     Follow up appointments reviewed:  PCP Hospital f/u appt confirmed? No  Scheduled to see PCP  on 09/01/2022   Are transportation arrangements needed? No  If their condition worsens, is the pt aware to call PCP or go to the Emergency Dept.? Yes Was the patient provided with contact information for the PCP's office or ED? Yes Was to pt encouraged to call back with questions or concerns? Yes

## 2022-08-24 ENCOUNTER — Telehealth: Payer: Self-pay

## 2022-08-24 DIAGNOSIS — E871 Hypo-osmolality and hyponatremia: Secondary | ICD-10-CM

## 2022-08-24 DIAGNOSIS — D72829 Elevated white blood cell count, unspecified: Secondary | ICD-10-CM

## 2022-08-24 NOTE — Telephone Encounter (Signed)
-----   Message from Dale [place], MD sent at 08/23/2022  6:15 AM EDT ----- Reviewed ER visit.  Pt presented with sob.  Treated.  Sodium low - 129.  Please call and confirm eating.  Any vomiting or diarrhea?  Needs f/u cbc (dx leukocytosis) and met b (hyponatremia) - this week if possible.  Let me know if needs earlier f/u appt

## 2022-09-01 ENCOUNTER — Encounter: Payer: Self-pay | Admitting: Internal Medicine

## 2022-09-01 ENCOUNTER — Ambulatory Visit (INDEPENDENT_AMBULATORY_CARE_PROVIDER_SITE_OTHER): Payer: Medicare Other | Admitting: Internal Medicine

## 2022-09-01 VITALS — BP 128/70 | HR 76 | Temp 97.9°F | Resp 16 | Ht 65.0 in | Wt 248.6 lb

## 2022-09-01 DIAGNOSIS — R051 Acute cough: Secondary | ICD-10-CM | POA: Diagnosis not present

## 2022-09-01 DIAGNOSIS — E1149 Type 2 diabetes mellitus with other diabetic neurological complication: Secondary | ICD-10-CM | POA: Diagnosis not present

## 2022-09-01 DIAGNOSIS — I10 Essential (primary) hypertension: Secondary | ICD-10-CM

## 2022-09-01 DIAGNOSIS — E1142 Type 2 diabetes mellitus with diabetic polyneuropathy: Secondary | ICD-10-CM | POA: Diagnosis not present

## 2022-09-01 DIAGNOSIS — E871 Hypo-osmolality and hyponatremia: Secondary | ICD-10-CM

## 2022-09-01 DIAGNOSIS — D72829 Elevated white blood cell count, unspecified: Secondary | ICD-10-CM | POA: Diagnosis not present

## 2022-09-01 LAB — BASIC METABOLIC PANEL
BUN: 11 mg/dL (ref 6–23)
CO2: 29 mEq/L (ref 19–32)
Calcium: 9.6 mg/dL (ref 8.4–10.5)
Chloride: 100 mEq/L (ref 96–112)
Creatinine, Ser: 0.73 mg/dL (ref 0.40–1.20)
GFR: 75.5 mL/min (ref >60.00)
Glucose, Bld: 122 mg/dL — ABNORMAL HIGH (ref 70–99)
Potassium: 4.4 mEq/L (ref 3.5–5.1)
Sodium: 135 mEq/L (ref 135–145)

## 2022-09-01 LAB — CBC WITH DIFFERENTIAL/PLATELET
Basophils Absolute: 0.1 10*3/uL (ref 0.0–0.1)
Basophils Relative: 0.7 % (ref 0.0–3.0)
Eosinophils Absolute: 0.2 10*3/uL (ref 0.0–0.7)
Eosinophils Relative: 2.6 % (ref 0.0–5.0)
HCT: 39.6 % (ref 36.0–46.0)
Hemoglobin: 13.1 g/dL (ref 12.0–15.0)
Lymphocytes Relative: 14.5 % (ref 12.0–46.0)
Lymphs Abs: 1.4 10*3/uL (ref 0.7–4.0)
MCHC: 33.1 g/dL (ref 30.0–36.0)
MCV: 89 fl (ref 78.0–100.0)
Monocytes Absolute: 1.6 10*3/uL — ABNORMAL HIGH (ref 0.1–1.0)
Monocytes Relative: 16.7 % — ABNORMAL HIGH (ref 3.0–12.0)
Neutro Abs: 6.2 10*3/uL (ref 1.4–7.7)
Neutrophils Relative %: 65.5 % (ref 43.0–77.0)
Platelets: 219 10*3/uL (ref 150.0–400.0)
RBC: 4.45 Mil/uL (ref 3.87–5.11)
RDW: 15 % (ref 11.5–15.5)
WBC: 9.5 10*3/uL (ref 4.0–10.5)

## 2022-09-01 NOTE — Progress Notes (Unsigned)
Subjective:    [name], female    DOB: [birth year], 82 y.o.   MRN: [id number]  Patient here for  Chief Complaint  Patient presents with   Medical Management of Chronic Issues    ED f/u    HPI Here for ER follow up.  Was initially seen 08/16/22 - in office for possible UTI. Placed on macrobid. Took a few doses.  Felt worse.  EMS called and reported oxygen saturation 90% on room air.  Transported to ER. Evaluated 08/17/22. Per note, she reported some mild sob and mild cough.  CXR - large hiatal hernia with chronic interstitial scarring. Per note review, was diagnosed with COPD exacerbation. Treated with bronchodilators, steroids and abx. Sodium low - 129.  Prior to going to ER - had emesis episodes.  Comes in today accompanied by her caretaker.  History obtained from both of them.  Feeling better. Denies any increased cough or congestion.  No chest pain or sob.  No vomiting.  Eating.  No urine or bowel change reported.  Feels back to her baseline.     Past Medical History:  Diagnosis Date   Anemia    Arthritis    Atrial fibrillation (HCC)    Breast cancer (HCC)    s/p lumpectomy 1992.  s/p chemo and xrt left breast   Diabetes mellitus (HCC)    Edema    feet/legs   Gastric ulcer    GERD (gastroesophageal reflux disease)    HOH (hard of hearing)    aides   Hypercholesterolemia    Hypertension    Personal history of chemotherapy    Pulmonary emboli [facility])    Past Surgical History:  Procedure Laterality Date   BREAST LUMPECTOMY  03/06/1990   left breast   CATARACT EXTRACTION W/PHACO Right 12/26/2016   Procedure: CATARACT EXTRACTION PHACO AND INTRAOCULAR LENS PLACEMENT (IOC)-RIGHT DIABETIC;  Surgeon: Galen Manila, MD;  Location: [facility] ORS;  Service: Ophthalmology;  Laterality: Right;  [place] 00:47 AP% 24.5 CDE 11.62 Fluid pack lot # [id number]   CATARACT EXTRACTION W/PHACO Left 01/23/2017   Procedure: CATARACT EXTRACTION PHACO AND INTRAOCULAR LENS PLACEMENT (IOC);   Surgeon: Galen Manila, MD;  Location: [facility] ORS;  Service: Ophthalmology;  Laterality: Left;  [place] 00:50 AP% 16.1 CDE 8.18 Fluid pack lot #[id number]   COLONOSCOPY WITH PROPOFOL N/A 07/08/2019   Procedure: COLONOSCOPY WITH PROPOFOL;  Surgeon: Midge Minium, MD;  Location: [facility] ENDOSCOPY;  Service: Endoscopy;  Laterality: N/A;   Family History  Problem Relation Age of Onset   Heart disease Father    Heart disease Brother        s/p CABG   Breast cancer Daughter    Colon cancer Neg Hx    Social History   Socioeconomic History   Marital status: Widowed    Spouse name: Not on file   Number of children: Not on file   Years of education: Not on file   Highest education level: Not on file  Occupational History   Not on file  Tobacco Use   Smoking status: Never   Smokeless tobacco: Never  Vaping Use   Vaping Use: Never used  Substance and Sexual Activity   Alcohol use: No    Alcohol/week: 0.0 standard drinks of alcohol   Drug use: No   Sexual activity: Never  Other Topics Concern   Not on file  Social History Narrative   Not on file   Social Determinants of Health   Financial Resource  Strain: Low Risk  (05/05/2022)   Overall Financial Resource Strain (CARDIA)    Difficulty of Paying Living Expenses: Not hard at all  Food Insecurity: No Food Insecurity (05/05/2022)   Hunger Vital Sign    Worried About Running Out of Food in the Last Year: Never true    Ran Out of Food in the Last Year: Never true  Transportation Needs: No Transportation Needs (05/05/2022)   PRAPARE - Administrator, Civil Service (Medical): No    Lack of Transportation (Non-Medical): No  Physical Activity: Insufficiently Active (05/05/2022)   Exercise Vital Sign    Days of Exercise per Week: 7 days    Minutes of Exercise per Session: 20 min  Stress: No Stress Concern Present (05/05/2022)   [employer] of Occupational Health - Occupational Stress Questionnaire    Feeling of Stress : Not at all   Social Connections: Unknown (05/05/2022)   Social Connection and Isolation Panel [NHANES]    Frequency of Communication with Friends and Family: More than three times a week    Frequency of Social Gatherings with Friends and Family: More than three times a week    Attends Religious Services: Not on Marketing executive or Organizations: Not on file    Attends Banker Meetings: Not on file    Marital Status: Not on file     Review of Systems  Constitutional:  Negative for appetite change and unexpected weight change.  HENT:  Negative for congestion and sinus pressure.   Respiratory:  Negative for cough, chest tightness and shortness of breath.   Cardiovascular:  Negative for chest pain and palpitations.       No increased swelling.  Stable.   Gastrointestinal:  Negative for abdominal pain, diarrhea, nausea and vomiting.  Genitourinary:  Negative for difficulty urinating and dysuria.  Musculoskeletal:  Negative for joint swelling and myalgias.  Skin:  Negative for color change and rash.  Neurological:  Negative for dizziness and headaches.  Psychiatric/Behavioral:  Negative for agitation and dysphoric mood.        Objective:     BP 128/70   Pulse 76   Temp 97.9 F (36.6 C)   Resp 16   Ht 5\' 5"  (1.651 m)   Wt 248 lb 9.6 oz (112.8 kg)   SpO2 98%   BMI 41.37 kg/m  Wt Readings from Last 3 Encounters:  09/01/22 248 lb 9.6 oz (112.8 kg)  08/16/22 246 lb (111.6 kg)  06/21/22 243 lb 9.6 oz (110.5 kg)    Physical Exam Vitals reviewed.  Constitutional:      General: She is not in acute distress.    Appearance: Normal appearance.  HENT:     Head: Normocephalic and atraumatic.     Right Ear: External ear normal.     Left Ear: External ear normal.  Eyes:     General: No scleral icterus.       Right eye: No discharge.        Left eye: No discharge.     Conjunctiva/sclera: Conjunctivae normal.  Neck:     Thyroid: No thyromegaly.  Cardiovascular:      Rate and Rhythm: Normal rate and regular rhythm.  Pulmonary:     Effort: No respiratory distress.     Breath sounds: Normal breath sounds. No wheezing.  Abdominal:     General: Bowel sounds are normal.     Palpations: Abdomen is soft.     Tenderness:  There is no abdominal tenderness.  Musculoskeletal:        General: No swelling or tenderness.     Cervical back: Neck supple. No tenderness.  Lymphadenopathy:     Cervical: No cervical adenopathy.  Skin:    Findings: No erythema or rash.  Neurological:     Mental Status: She is alert.  Psychiatric:        Mood and Affect: Mood normal.        Behavior: Behavior normal.      Outpatient Encounter Medications as of 09/01/2022  Medication Sig   albuterol (VENTOLIN HFA) 108 (90 Base) MCG/ACT inhaler Inhale 2 puffs into the lungs every 6 (six) hours as needed for wheezing or shortness of breath.   amLODipine (NORVASC) 2.5 MG tablet Take 1 tablet (2.5 mg total) by mouth daily.   diphenhydrAMINE (BENADRYL) 25 mg capsule Take 25 mg at bedtime as needed by mouth for sleep.    feeding supplement (ENSURE ENLIVE / ENSURE PLUS) LIQD Take 237 mLs by mouth 2 (two) times daily between meals.   ferrous sulfate (FEROSUL) 325 (65 FE) MG tablet Take 1 tablet (325 mg total) by mouth every other day. One tablet qod   glucose blood (CONTOUR TEST) test strip USE TO TEST BLOOD SUGAR ONCE DAILY   losartan (COZAAR) 100 MG tablet TAKE 1 TABLET(100 MG) BY MOUTH DAILY   Multiple Vitamin (MULTIVITAMIN WITH MINERALS) TABS tablet Take 1 tablet daily by mouth.   pantoprazole (PROTONIX) 40 MG tablet TAKE 1 TABLET(40 MG) BY MOUTH DAILY   simvastatin (ZOCOR) 10 MG tablet Take 1 tablet (10 mg total) by mouth daily.   Spacer/Aero-Holding Chambers (AEROCHAMBER MV) inhaler Use as instructed   [DISCONTINUED] nitrofurantoin, macrocrystal-monohydrate, (MACROBID) 100 MG capsule Take 1 capsule (100 mg total) by mouth 2 (two) times daily.   [DISCONTINUED] predniSONE (STERAPRED  UNI-PAK 21 TAB) 10 MG (21) TBPK tablet As directed on packaging   No facility-administered encounter medications on file as of 09/01/2022.     Lab Results  Component Value Date   WBC 9.5 09/01/2022   HGB 13.1 09/01/2022   HCT 39.6 09/01/2022   PLT 219.0 09/01/2022   GLUCOSE 122 (H) 09/01/2022   CHOL 141 06/12/2022   TRIG 97.0 06/12/2022   HDL 63.40 06/12/2022   LDLCALC 59 06/12/2022   ALT 16 08/17/2022   AST 23 08/17/2022   NA 135 09/01/2022   K 4.4 09/01/2022   CL 100 09/01/2022   CREATININE 0.73 09/01/2022   BUN 11 09/01/2022   CO2 29 09/01/2022   TSH 3.86 03/01/2022   INR 1.2 (H) 07/22/2020   HGBA1C 5.9 06/12/2022   MICROALBUR 0.9 10/26/2021    DG Chest 2 View  Result Date: 08/17/2022 CLINICAL DATA:  Hypoxia EXAM: CHEST - 2 VIEW COMPARISON:  10/24/2016 FINDINGS: Cardiac shadow is stable. Aortic calcifications are noted. Large hiatal hernia is noted. Lungs are well aerated with scattered interstitial changes which appear chronic. No bony abnormality is seen. IMPRESSION: Large hiatal hernia. Chronic interstitial scarring. Electronically Signed   By: Alcide Clever M.D.   On: 08/17/2022 19:33       Assessment & Plan:  Acute cough Assessment & Plan: Reviewed ER records. Reported cough on presentation.  Treated for copd exacerbation.  Treated with doxycycline and prednisone taper.  Feeling better.  No cough or congestion currently.  No sob.     Hyponatremia -     Basic metabolic panel  Leukocytosis, unspecified type -     CBC  with Differential/Platelet  Diabetic polyneuropathy associated with type 2 diabetes mellitus (HCC) Assessment & Plan: Sugars have been well controlled.  Continue low carb diet and exercise.  Continue to use walker for ambulation.  Follow met b and a1c.    Essential hypertension Assessment & Plan: Continue losartan and amlodipine.  Blood pressure as outlined. .  Follow pressures.  Follow metabolic panel.    Primary hypertension Assessment &  Plan: Continue losartan and amlodipine.  Follow pressures.    Type 2 diabetes mellitus with neurological complications (HCC) Assessment & Plan: Low carb diet and exercise as tolerated.  Follow met b and a1c.       Dale [place], MD

## 2022-09-03 ENCOUNTER — Encounter: Payer: Self-pay | Admitting: Internal Medicine

## 2022-09-03 NOTE — Assessment & Plan Note (Signed)
Continue losartan and amlodipine.  Follow pressures.  

## 2022-09-03 NOTE — Assessment & Plan Note (Signed)
Reviewed ER records. Reported cough on presentation.  Treated for copd exacerbation.  Treated with doxycycline and prednisone taper.  Feeling better.  No cough or congestion currently.  No sob.

## 2022-09-03 NOTE — Assessment & Plan Note (Signed)
Sugars have been well controlled.  Continue low carb diet and exercise.  Continue to use walker for ambulation.  Follow met b and a1c.  ?

## 2022-09-03 NOTE — Assessment & Plan Note (Signed)
Continue losartan and amlodipine.  Blood pressure as outlined.   Follow pressures.  Follow metabolic panel.  °

## 2022-09-03 NOTE — Assessment & Plan Note (Signed)
Low carb diet and exercise as tolerated.  Follow met b and a1c.   

## 2022-09-05 ENCOUNTER — Telehealth: Payer: Self-pay

## 2022-09-05 NOTE — Telephone Encounter (Signed)
Pt called back and I read the message to her and she verbalized understanding 

## 2022-09-05 NOTE — Telephone Encounter (Signed)
Noted  

## 2022-09-05 NOTE — Telephone Encounter (Signed)
-----   Message from Dale [place], MD sent at 09/03/2022  9:02 AM EDT ----- Please call and notify - sodium is now wnl.  Kidney function looks good.  White blood cell count is wnl.

## 2022-10-04 ENCOUNTER — Encounter (INDEPENDENT_AMBULATORY_CARE_PROVIDER_SITE_OTHER): Payer: Self-pay

## 2022-10-05 ENCOUNTER — Ambulatory Visit
Admission: EM | Admit: 2022-10-05 | Discharge: 2022-10-05 | Disposition: A | Discharge status: Home / Self Care | Payer: Medicare Other | Attending: Family Medicine | Admitting: Family Medicine

## 2022-10-05 DIAGNOSIS — R34 Anuria and oliguria: Secondary | ICD-10-CM | POA: Insufficient documentation

## 2022-10-05 DIAGNOSIS — R110 Nausea: Secondary | ICD-10-CM | POA: Insufficient documentation

## 2022-10-05 LAB — URINALYSIS, W/ REFLEX TO CULTURE (INFECTION SUSPECTED)
Bilirubin Urine: NEGATIVE
Glucose, UA: NEGATIVE mg/dL
Hgb urine dipstick: NEGATIVE
Ketones, ur: NEGATIVE mg/dL
Nitrite: NEGATIVE
Protein, ur: NEGATIVE mg/dL
Specific Gravity, Urine: 1.015 (ref 1.005–1.030)
pH: 6 (ref 5.0–8.0)

## 2022-10-05 MED ORDER — ONDANSETRON 4 MG PO TBDP: 4.0000 mg | ORAL_TABLET | Freq: Three times a day (TID) | ORAL | 0 refills | Status: DC | PRN

## 2022-10-05 MED ORDER — ONDANSETRON 4 MG PO TBDP
4.0000 mg | ORAL_TABLET | Freq: Once | ORAL | Status: AC
  Administered 2022-10-05: 4 mg via ORAL

## 2022-10-05 NOTE — Discharge Instructions (Addendum)
Stop by the pharmacy to pick up your prescriptions.  Follow up with your primary care provider as needed.  Take Ms Anita Walker to the emergency room if she doesn't urinate once in 12 hours.  She may need an ultrasound to see how much fluid is in her bladder.    I sent her urine for culture. If she needs to start antibiotics, someone will contact you.

## 2022-10-05 NOTE — ED Triage Notes (Signed)
Pt c/o urinary retention & nausea x1 day. States she has been going once every few hrs. Denies any freq,burning, pain or hematuria.

## 2022-10-05 NOTE — ED Provider Notes (Signed)
[facility]    CSN: [id number] Arrival date & time: 10/05/22  1010      History   Chief Complaint Chief Complaint  Patient presents with   Urinary Retention   Nausea     HPI HPI Anita Walker is a 82 y.o. female.    Anita Walker presents for nausea and decreased urination that started this morning.  Caregiver reports she had the same symptoms and had a UTI.   Has urinated 2-3 times since 7 AM which is decreased from her norm.  Denies urinary urgency, back pain, abdominal pain, fever, blood in urine, vomiting or decreased po intake.    She notes that her good friend of 30 years died recently and this has been on her mind.           Past Medical History:  Diagnosis Date   Anemia    Arthritis    Atrial fibrillation (HCC)    Breast cancer (HCC)    s/p lumpectomy 1992.  s/p chemo and xrt left breast   Diabetes mellitus (HCC)    Edema    feet/legs   Gastric ulcer    GERD (gastroesophageal reflux disease)    HOH (hard of hearing)    aides   Hypercholesterolemia    Hypertension    Personal history of chemotherapy    Pulmonary emboli [facility])     Patient Active Problem List   Diagnosis Date Noted   Urinary urgency 08/16/2022   Hearing loss 04/26/2022   Congestion of nasal sinus 04/26/2022   Recurrent falls 07/08/2021   Weakness of both lower extremities 07/08/2021   Closed superior and inferior pubic rami fractures, sequela (Right) 12/15/2020   Low back strain 11/15/2020   Lumbar spondylosis 11/15/2020   History of pelvic fracture 07/04/2020   Anemia 07/04/2020   Cerebral atrophy (HCC) 03/20/2020   Chronic low back pain (Midline) w/o sciatica 01/26/2020   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   Kissing spine syndrome (L3-S1) 01/19/2020   Grade 1 Retrolisthesis of L3/L4 12/18/2019   Lip lesion 12/14/2019   Acute exacerbation of chronic low back pain 12/11/2019   At high risk for falls 12/11/2019   Fall at home, sequela 12/11/2019   Head  injury 12/02/2019   Bradycardia following surgery 11/18/2019   Wrist fracture 11/02/2019   Weakness 11/02/2019   Change in bowel movement 11/02/2019   DDD (degenerative disc disease), lumbosacral 09/18/2019   Irritability and anger 09/18/2019   Nausea without vomiting 09/18/2019   History of vasovagal symptom 08/14/2019    Class: History of   Polyp of descending colon    Positive colorectal cancer screening using Cologuard test 05/25/2019   Spondylosis without myelopathy or radiculopathy, lumbosacral region 03/11/2019   Chronic 50% compression fracture of L3 lumbar vertebra, sequela, w/ retropulsion 02/18/2019   Compression fracture of thoracolumbar vertebra (T11), sequela 02/18/2019   Lumbar facet arthropathy (Multilevel) 02/18/2019   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, 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) 02/18/2019   L5-S1 disc-osteophyte complex (Right) 02/18/2019   Lumbar facet syndrome (Bilateral) (R>L) 02/18/2019   Chronic anticoagulation (Coumadin) 02/18/2019   Chronic low back pain (Bilateral) (R>L) w/o sciatica 02/18/2019   Chronic pain syndrome 02/18/2019   Compression fracture of thoracic vertebra (HCC) 10/12/2018   Pain due to onychomycosis of toenails of both feet 09/02/2018   Constipation 08/18/2018   Fall 08/18/2018   Diabetic polyneuropathy associated with type 2 diabetes mellitus (HCC) 03/18/2017   Abnormal chest CT 03/18/2017   Urinary frequency  01/21/2017   Syncope 11/10/2016   Right hip pain 10/30/2016   Cough 10/25/2016   Bronchitis 04/16/2015   Health care maintenance 05/23/2014   Mixed emotional features as adjustment reaction 07/20/2013   Long term current use of anticoagulant therapy 04/11/2013   Hypertension 01/01/2012   Hypercholesteremia 01/01/2012   History of breast cancer 01/01/2012   Essential hypertension 01/01/2012   Pure hypercholesterolemia 01/01/2012   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    Past Surgical History:   Procedure Laterality Date   BREAST LUMPECTOMY  03/06/1990   left breast   CATARACT EXTRACTION W/PHACO Right 12/26/2016   Procedure: CATARACT EXTRACTION PHACO AND INTRAOCULAR LENS PLACEMENT (IOC)-RIGHT DIABETIC;  Surgeon: Galen Manila, MD;  Location: [facility] ORS;  Service: Ophthalmology;  Laterality: Right;  [place] 00:47 AP% 24.5 CDE 11.62 Fluid pack lot # [id number]   CATARACT EXTRACTION W/PHACO Left 01/23/2017   Procedure: CATARACT EXTRACTION PHACO AND INTRAOCULAR LENS PLACEMENT (IOC);  Surgeon: Galen Manila, MD;  Location: [facility] ORS;  Service: Ophthalmology;  Laterality: Left;  [place] 00:50 AP% 16.1 CDE 8.18 Fluid pack lot #[id number]   COLONOSCOPY WITH PROPOFOL N/A 07/08/2019   Procedure: COLONOSCOPY WITH PROPOFOL;  Surgeon: Midge Minium, MD;  Location: [facility] ENDOSCOPY;  Service: Endoscopy;  Laterality: N/A;    OB History   No obstetric history on file.      Home Medications    Prior to Admission medications   Medication Sig Start Date End Date Taking? Authorizing Provider  albuterol (VENTOLIN HFA) 108 (90 Base) MCG/ACT inhaler Inhale 2 puffs into the lungs every 6 (six) hours as needed for wheezing or shortness of breath. 08/18/22  Yes Merwyn Katos, MD  amLODipine (NORVASC) 2.5 MG tablet Take 1 tablet (2.5 mg total) by mouth daily. 06/21/22  Yes Dale [place], MD  diphenhydrAMINE (BENADRYL) 25 mg capsule Take 25 mg at bedtime as needed by mouth for sleep.    Yes [provider]  feeding supplement (ENSURE ENLIVE / ENSURE PLUS) LIQD Take 237 mLs by mouth 2 (two) times daily between meals. 04/19/20  Yes Enedina Finner, MD  ferrous sulfate (FEROSUL) 325 (65 FE) MG tablet Take 1 tablet (325 mg total) by mouth every other day. One tablet qod 06/21/22  Yes Scott, Westley [place], MD  glucose blood (CONTOUR TEST) test strip USE TO TEST BLOOD SUGAR ONCE DAILY 04/20/22  Yes Dale [place], MD  losartan (COZAAR) 100 MG tablet TAKE 1 TABLET(100 MG) BY MOUTH DAILY 03/13/22  Yes Dale [place], MD   Multiple Vitamin (MULTIVITAMIN WITH MINERALS) TABS tablet Take 1 tablet daily by mouth.   Yes [provider]  ondansetron (ZOFRAN-ODT) 4 MG disintegrating tablet Take 1 tablet (4 mg total) by mouth every 8 (eight) hours as needed. 10/05/22  Yes Honesti [place], DO  pantoprazole (PROTONIX) 40 MG tablet TAKE 1 TABLET(40 MG) BY MOUTH DAILY 08/16/22  Yes Dale [place], MD  simvastatin (ZOCOR) 10 MG tablet Take 1 tablet (10 mg total) by mouth daily. 06/21/22  Yes Dale , MD  Spacer/Aero-Holding Chambers (AEROCHAMBER MV) inhaler Use as instructed 08/18/22  Yes Merwyn Katos, MD    Family History Family History  Problem Relation Age of Onset   Heart disease Father    Heart disease Brother        s/p CABG   Breast cancer Daughter    Colon cancer Neg Hx     Social History Social History   Tobacco Use   Smoking status: Never   Smokeless tobacco: Never  Vaping Use   Vaping status: Never Used  Substance Use Topics   Alcohol use: No    Alcohol/week: 0.0 standard drinks of alcohol   Drug use: No     Allergies   Penicillins and Penicillin v potassium   Review of Systems Review of Systems: :negative unless otherwise stated in HPI.      Physical Exam Triage Vital Signs ED Triage Vitals  Encounter Vitals Group     BP 10/05/22 1021 117/79     Systolic BP Percentile --      Diastolic BP Percentile --      Pulse Rate 10/05/22 1021 100     Resp 10/05/22 1021 16     Temp 10/05/22 1021 98.5 F (36.9 C)     Temp Source 10/05/22 1021 Oral     SpO2 10/05/22 1021 94 %     Weight 10/05/22 1021 245 lb (111.1 kg)     Height 10/05/22 1021 5' (1.524 m)     Head Circumference --      Peak Flow --      Pain Score 10/05/22 1035 0     Pain Loc --      Pain Education --      Exclude from Growth Chart --    No data found.  Updated Vital Signs BP 117/79 (BP Location: Right Arm)   Pulse 100   Temp 98.5 F (36.9 C) (Oral)   Resp 16   Ht 5' (1.524 m)   Wt 111.1 kg    SpO2 94%   BMI 47.85 kg/m   Visual Acuity Right Eye Distance:   Left Eye Distance:   Bilateral Distance:    Right Eye Near:   Left Eye Near:    Bilateral Near:     Physical Exam GEN: well appearing female in no acute distress  CVS: well perfused  RESP: speaking in full sentences without pause  ABD: soft, non-tender, non-distended, no palpable masses, no CVA tenderness    UC Treatments / Results  Labs (all labs ordered are listed, but only abnormal results are displayed) Labs Reviewed  URINALYSIS, W/ REFLEX TO CULTURE (INFECTION SUSPECTED) - Abnormal; Notable for the following components:      Result Value   Leukocytes,Ua TRACE (*)    Bacteria, UA FEW (*)    All other components within normal limits  URINE CULTURE    EKG   Radiology No results found.  Procedures Procedures (including critical care time)  Medications Ordered in UC Medications  ondansetron (ZOFRAN-ODT) disintegrating tablet 4 mg (4 mg Oral Given 10/05/22 1123)    Initial Impression / Assessment and Plan / UC Course  I have reviewed the triage vital signs and the nursing notes.  Pertinent labs & imaging results that were available during my care of the patient were reviewed by me and considered in my medical decision making (see chart for details).      Patient is a 82 y.o. female  who presents for nausea and concern over decreased urination. Caregiver reports Zonda Sundin urinates a couple times an hour at baseline so the decrease to 3 times in 4 hours in abnormal.  Denies dysuria.   Overall patient is well-appearing and afebrile.  Vital signs stable.  UA consistent with acute cystitis. Has trace leukocytes UA but few bacteria.  Urine culture obtained.  Follow-up sensitivities and start antibiotics, if needed. Return and ED precautions including no urine production or small amounts of urine production, abdominal pain, fever, chills, or  vomiting given. Zofran given here and prescribed.     Discussed MDM,  treatment plan and plan for follow-up with patient and her caregiver who agree with plan.        Final Clinical Impressions(s) / UC Diagnoses   Final diagnoses:  Nausea  Decreased urination     Discharge Instructions      Stop by the pharmacy to pick up your prescriptions.  Follow up with your primary care provider as needed.  Take Ms Kaedence Peairs to the emergency room if she doesn't urinate once in 12 hours.  She may need an ultrasound to see how much fluid is in her bladder.    I sent her urine for culture. If she needs to start antibiotics, someone will contact you.        ED Prescriptions     Medication Sig Dispense Auth. Provider   ondansetron (ZOFRAN-ODT) 4 MG disintegrating tablet Take 1 tablet (4 mg total) by mouth every 8 (eight) hours as needed. 20 tablet Katha Cabal, DO      PDMP not reviewed this encounter.   Katha Cabal, DO 10/05/22 1131

## 2022-10-22 ENCOUNTER — Ambulatory Visit: Payer: Medicare Other

## 2022-10-22 DIAGNOSIS — R34 Anuria and oliguria: Secondary | ICD-10-CM | POA: Diagnosis not present

## 2022-10-30 ENCOUNTER — Ambulatory Visit: Payer: Medicare Other | Admitting: Podiatry

## 2022-10-30 ENCOUNTER — Encounter: Payer: Self-pay | Admitting: Podiatry

## 2022-10-30 DIAGNOSIS — E1142 Type 2 diabetes mellitus with diabetic polyneuropathy: Secondary | ICD-10-CM | POA: Diagnosis not present

## 2022-10-30 DIAGNOSIS — L84 Corns and callosities: Secondary | ICD-10-CM

## 2022-10-30 DIAGNOSIS — M79675 Pain in left toe(s): Secondary | ICD-10-CM | POA: Diagnosis not present

## 2022-10-30 DIAGNOSIS — M79674 Pain in right toe(s): Secondary | ICD-10-CM | POA: Diagnosis not present

## 2022-10-30 DIAGNOSIS — B351 Tinea unguium: Secondary | ICD-10-CM

## 2022-10-30 NOTE — Progress Notes (Signed)
  Subjective:  [name], female    DOB: 05/20/1940,  MRN: [id number]  Anita Walker presents to clinic today for at risk foot care with history of diabetic neuropathy and corn(s) of both feet and painful thick toenails that are difficult to trim. Painful toenails interfere with ambulation. Aggravating factors include wearing enclosed shoe gear. Pain is relieved with periodic professional debridement. Painful corns are aggravated when weightbearing when wearing enclosed shoe gear. Pain is relieved with periodic professional debridement.  Chief Complaint  Patient presents with   Nail Problem    DFC,Referring Provider Dale Deloit, MD,lov:06/24,A1C:5.9,BS: not today      New problem(s): None.   PCP is Dale [place], MD.  Allergies  Allergen Reactions   Penicillins Other (See Comments)    Unknown- pt states been a long time ago  Has patient had a PCN reaction causing immediate rash, facial/tongue/throat swelling, SOB or lightheadedness with hypotension: Unknown Has patient had a PCN reaction causing severe rash involving mucus membranes or skin necrosis: Unknown Has patient had a PCN reaction that required hospitalization: Unknown Has patient had a PCN reaction occurring within the last 10 years: Unknown If all of the above answers are "NO", then may proceed with Cephalosporin use.   Penicillin V Potassium Nausea And Vomiting    Review of Systems: Negative except as noted in the HPI.  Objective:  There were no vitals filed for this visit. Anita Walker is a pleasant 82 y.o. female morbidly obese in NAD. AAO x 3.  Vascular Examination: Capillary refill time <3 seconds b/l LE. Palpable pedal pulses b/l LE. Digital hair absent b/l. No pedal edema b/l. Skin temperature gradient WNL b/l. Mild varicosities b/l LE.Marland Kitchen  Dermatological Examination: Pedal skin with normal turgor, texture and tone b/l. No open wounds. No interdigital macerations b/l.   Toenails 1-5  b/l thickened, discolored, dystrophic with subungual debris. There is pain on palpation to dorsal aspect of nailplates.   Hyperkeratotic lesion(s) dorsal PIPJ of left 4th toe.  No erythema, no edema, no drainage, no fluctuance.  Neurological Examination: Protective sensation intact with 10 gram monofilament b/l LE. Vibratory sensation intact b/l LE.   Musculoskeletal Examination: Muscle strength 5/5 to all LE muscle groups b/l. 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  Assessment/Plan: 1. Pain due to onychomycosis of toenails of both feet   2. Corns   3. Diabetic polyneuropathy associated with type 2 diabetes mellitus (HCC)     -Patient was evaluated and treated. All patient's and/or POA's questions/concerns answered on today's visit. -Caregiver/provider present with patient on today's visit. -Continue foot and shoe inspections daily. Monitor blood glucose per PCP/Endocrinologist's recommendations. -Continue supportive shoe gear daily. -Mycotic toenails 1-5 bilaterally were debrided in length and girth with sterile nail nippers and dremel without incident. -Corn(s) L 4th toe pared utilizing sterile scalpel blade without complication or incident. Total number debrided=1. -Patient/POA to call should there be question/concern in the interim.   Return in about 3 months (around 01/30/2023).  Freddie Breech, DPM

## 2022-11-01 ENCOUNTER — Other Ambulatory Visit (INDEPENDENT_AMBULATORY_CARE_PROVIDER_SITE_OTHER): Payer: Medicare Other

## 2022-11-01 DIAGNOSIS — E7800 Pure hypercholesterolemia, unspecified: Secondary | ICD-10-CM | POA: Diagnosis not present

## 2022-11-01 DIAGNOSIS — E1149 Type 2 diabetes mellitus with other diabetic neurological complication: Secondary | ICD-10-CM | POA: Diagnosis not present

## 2022-11-01 LAB — LIPID PANEL
Cholesterol: 135 mg/dL (ref 0–200)
HDL: 63.5 mg/dL (ref >39.00)
LDL Cholesterol: 59 mg/dL (ref 0–99)
NonHDL: 71.34
Total CHOL/HDL Ratio: 2
Triglycerides: 63 mg/dL (ref 0.0–149.0)
VLDL: 12.6 mg/dL (ref 0.0–40.0)

## 2022-11-01 LAB — HEPATIC FUNCTION PANEL
ALT: 13 U/L (ref 0–35)
AST: 16 U/L (ref 0–37)
Albumin: 4.1 g/dL (ref 3.5–5.2)
Alkaline Phosphatase: 44 U/L (ref 39–117)
Bilirubin, Direct: 0.2 mg/dL (ref 0.0–0.3)
Total Bilirubin: 0.8 mg/dL (ref 0.2–1.2)
Total Protein: 7.1 g/dL (ref 6.0–8.3)

## 2022-11-01 LAB — HEMOGLOBIN A1C: Hgb A1c MFr Bld: 5.9 % (ref 4.6–6.5)

## 2022-11-02 ENCOUNTER — Ambulatory Visit (INDEPENDENT_AMBULATORY_CARE_PROVIDER_SITE_OTHER): Payer: Medicare Other

## 2022-11-02 ENCOUNTER — Other Ambulatory Visit: Payer: Self-pay | Admitting: Internal Medicine

## 2022-11-02 DIAGNOSIS — E1149 Type 2 diabetes mellitus with other diabetic neurological complication: Secondary | ICD-10-CM

## 2022-11-02 LAB — BASIC METABOLIC PANEL
BUN: 18 mg/dL (ref 6–23)
CO2: 25 mEq/L (ref 19–32)
Calcium: 10.4 mg/dL (ref 8.4–10.5)
Chloride: 98 mEq/L (ref 96–112)
Creatinine, Ser: 0.87 mg/dL (ref 0.40–1.20)
GFR: 61.1 mL/min (ref >60.00)
Glucose, Bld: 104 mg/dL — ABNORMAL HIGH (ref 70–99)
Potassium: 4.8 mEq/L (ref 3.5–5.1)
Sodium: 133 mEq/L — ABNORMAL LOW (ref 135–145)

## 2022-11-02 NOTE — Progress Notes (Signed)
Order placed for met b. (Add on lab)

## 2022-11-03 ENCOUNTER — Encounter: Payer: Self-pay | Admitting: Internal Medicine

## 2022-11-03 ENCOUNTER — Ambulatory Visit: Payer: Medicare Other | Admitting: Internal Medicine

## 2022-11-03 VITALS — BP 128/70 | HR 88 | Temp 98.2°F | Resp 16 | Ht 65.0 in | Wt 242.0 lb

## 2022-11-03 DIAGNOSIS — I10 Essential (primary) hypertension: Secondary | ICD-10-CM

## 2022-11-03 DIAGNOSIS — Z853 Personal history of malignant neoplasm of breast: Secondary | ICD-10-CM | POA: Diagnosis not present

## 2022-11-03 DIAGNOSIS — G319 Degenerative disease of nervous system, unspecified: Secondary | ICD-10-CM | POA: Diagnosis not present

## 2022-11-03 DIAGNOSIS — E1149 Type 2 diabetes mellitus with other diabetic neurological complication: Secondary | ICD-10-CM

## 2022-11-03 DIAGNOSIS — E7800 Pure hypercholesterolemia, unspecified: Secondary | ICD-10-CM

## 2022-11-03 DIAGNOSIS — R3911 Hesitancy of micturition: Secondary | ICD-10-CM | POA: Diagnosis not present

## 2022-11-03 DIAGNOSIS — E1142 Type 2 diabetes mellitus with diabetic polyneuropathy: Secondary | ICD-10-CM | POA: Diagnosis not present

## 2022-11-03 DIAGNOSIS — D649 Anemia, unspecified: Secondary | ICD-10-CM

## 2022-11-03 LAB — URINALYSIS, ROUTINE W REFLEX MICROSCOPIC
Bilirubin Urine: NEGATIVE
Hgb urine dipstick: NEGATIVE
Ketones, ur: NEGATIVE
Nitrite: NEGATIVE
Specific Gravity, Urine: 1.01 (ref 1.000–1.030)
Total Protein, Urine: NEGATIVE
Urine Glucose: NEGATIVE
Urobilinogen, UA: 1 (ref 0.0–1.0)
pH: 7 (ref 5.0–8.0)

## 2022-11-03 LAB — HM DIABETES FOOT EXAM

## 2022-11-03 NOTE — Progress Notes (Unsigned)
Subjective:    [name], female    DOB: [birth year], 82 y.o.   MRN: [id number]  Patient here for  Chief Complaint  Patient presents with   Medical Management of Chronic Issues    HPI Here to follow up regarding diabetes, hypertension and hypercholesterolemia. Evaluated 10/05/22 and 10/22/22 - decreased urination. Diagnosed with UTI. Urine culture negative. Has intermittent issues with increased urinary frequency and some hesitancy.  No dysuria.  No hematuria.  No vaginal symptoms reported.  No infection present the last two flares.  Initial flare - positive infection - also some associated nausea.  Eating and drinking.  No abdominal pain.  Bowels stable.  No chest pain.  Breathing stable.    Past Medical History:  Diagnosis Date   Anemia    Arthritis    Atrial fibrillation (HCC)    Breast cancer (HCC)    s/p lumpectomy 1992.  s/p chemo and xrt left breast   Diabetes mellitus (HCC)    Edema    feet/legs   Gastric ulcer    GERD (gastroesophageal reflux disease)    HOH (hard of hearing)    aides   Hypercholesterolemia    Hypertension    Personal history of chemotherapy    Pulmonary emboli [facility])    Past Surgical History:  Procedure Laterality Date   BREAST LUMPECTOMY  03/06/1990   left breast   CATARACT EXTRACTION W/PHACO Right 12/26/2016   Procedure: CATARACT EXTRACTION PHACO AND INTRAOCULAR LENS PLACEMENT (IOC)-RIGHT DIABETIC;  Surgeon: Galen Manila, MD;  Location: [facility] ORS;  Service: Ophthalmology;  Laterality: Right;  [place] 00:47 AP% 24.5 CDE 11.62 Fluid pack lot # [id number]   CATARACT EXTRACTION W/PHACO Left 01/23/2017   Procedure: CATARACT EXTRACTION PHACO AND INTRAOCULAR LENS PLACEMENT (IOC);  Surgeon: Galen Manila, MD;  Location: [facility] ORS;  Service: Ophthalmology;  Laterality: Left;  [place] 00:50 AP% 16.1 CDE 8.18 Fluid pack lot #[id number]   COLONOSCOPY WITH PROPOFOL N/A 07/08/2019   Procedure: COLONOSCOPY WITH PROPOFOL;  Surgeon: Midge Minium, MD;   Location: [facility] ENDOSCOPY;  Service: Endoscopy;  Laterality: N/A;   Family History  Problem Relation Age of Onset   Heart disease Father    Heart disease Brother        s/p CABG   Breast cancer Daughter    Colon cancer Neg Hx    Social History   Socioeconomic History   Marital status: Widowed    Spouse name: Not on file   Number of children: Not on file   Years of education: Not on file   Highest education level: Not on file  Occupational History   Not on file  Tobacco Use   Smoking status: Never   Smokeless tobacco: Never  Vaping Use   Vaping status: Never Used  Substance and Sexual Activity   Alcohol use: No    Alcohol/week: 0.0 standard drinks of alcohol   Drug use: No   Sexual activity: Never  Other Topics Concern   Not on file  Social History Narrative   Not on file   Social Determinants of Health   Financial Resource Strain: Low Risk  (05/05/2022)   Overall Financial Resource Strain (CARDIA)    Difficulty of Paying Living Expenses: Not hard at all  Food Insecurity: No Food Insecurity (05/05/2022)   Hunger Vital Sign    Worried About Running Out of Food in the Last Year: Never true    Ran Out of Food in the Last Year: Never true  Transportation Needs: No Transportation Needs (05/05/2022)   PRAPARE - Administrator, Civil Service (Medical): No    Lack of Transportation (Non-Medical): No  Physical Activity: Insufficiently Active (05/05/2022)   Exercise Vital Sign    Days of Exercise per Week: 7 days    Minutes of Exercise per Session: 20 min  Stress: No Stress Concern Present (05/05/2022)   [employer] of Occupational Health - Occupational Stress Questionnaire    Feeling of Stress : Not at all  Social Connections: Unknown (05/05/2022)   Social Connection and Isolation Panel [NHANES]    Frequency of Communication with Friends and Family: More than three times a week    Frequency of Social Gatherings with Friends and Family: More than three times a week     Attends Religious Services: Not on Marketing executive or Organizations: Not on file    Attends Banker Meetings: Not on file    Marital Status: Not on file     Review of Systems  Constitutional:  Negative for appetite change and unexpected weight change.  HENT:  Negative for congestion and sinus pressure.   Respiratory:  Negative for cough, chest tightness and shortness of breath.   Cardiovascular:  Negative for chest pain and palpitations.       No increased swelling.   Gastrointestinal:  Negative for abdominal pain, diarrhea and vomiting.  Genitourinary:  Positive for frequency and urgency. Negative for dysuria.  Musculoskeletal:  Negative for joint swelling and myalgias.  Skin:  Negative for color change and rash.  Neurological:  Negative for dizziness and headaches.  Psychiatric/Behavioral:  Negative for agitation and dysphoric mood.        Objective:     BP 128/70   Pulse 88   Temp 98.2 F (36.8 C)   Resp 16   Ht 5\' 5"  (1.651 m)   Wt 242 lb (109.8 kg)   SpO2 98%   BMI 40.27 kg/m  Wt Readings from Last 3 Encounters:  11/03/22 242 lb (109.8 kg)  10/05/22 245 lb (111.1 kg)  09/01/22 248 lb 9.6 oz (112.8 kg)    Physical Exam Vitals reviewed.  Constitutional:      General: She is not in acute distress.    Appearance: Normal appearance.  HENT:     Head: Normocephalic and atraumatic.     Right Ear: External ear normal.     Left Ear: External ear normal.  Eyes:     General: No scleral icterus.       Right eye: No discharge.        Left eye: No discharge.     Conjunctiva/sclera: Conjunctivae normal.  Neck:     Thyroid: No thyromegaly.  Cardiovascular:     Rate and Rhythm: Normal rate and regular rhythm.  Pulmonary:     Effort: No respiratory distress.     Breath sounds: Normal breath sounds. No wheezing.  Abdominal:     General: Bowel sounds are normal.     Palpations: Abdomen is soft.     Tenderness: There is no abdominal  tenderness.  Musculoskeletal:        General: No swelling or tenderness.     Cervical back: Neck supple. No tenderness.  Lymphadenopathy:     Cervical: No cervical adenopathy.  Skin:    Findings: No erythema or rash.  Neurological:     Mental Status: She is alert.  Psychiatric:        Mood  and Affect: Mood normal.        Behavior: Behavior normal.      Outpatient Encounter Medications as of 11/03/2022  Medication Sig   albuterol (VENTOLIN HFA) 108 (90 Base) MCG/ACT inhaler Inhale 2 puffs into the lungs every 6 (six) hours as needed for wheezing or shortness of breath.   amLODipine (NORVASC) 2.5 MG tablet Take 1 tablet (2.5 mg total) by mouth daily.   diphenhydrAMINE (BENADRYL) 25 mg capsule Take 25 mg at bedtime as needed by mouth for sleep.    feeding supplement (ENSURE ENLIVE / ENSURE PLUS) LIQD Take 237 mLs by mouth 2 (two) times daily between meals.   ferrous sulfate (FEROSUL) 325 (65 FE) MG tablet Take 1 tablet (325 mg total) by mouth every other day. One tablet qod   glucose blood (CONTOUR TEST) test strip USE TO TEST BLOOD SUGAR ONCE DAILY   losartan (COZAAR) 100 MG tablet TAKE 1 TABLET(100 MG) BY MOUTH DAILY   Multiple Vitamin (MULTIVITAMIN WITH MINERALS) TABS tablet Take 1 tablet daily by mouth.   ondansetron (ZOFRAN-ODT) 4 MG disintegrating tablet Take 1 tablet (4 mg total) by mouth every 8 (eight) hours as needed.   pantoprazole (PROTONIX) 40 MG tablet TAKE 1 TABLET(40 MG) BY MOUTH DAILY   simvastatin (ZOCOR) 10 MG tablet Take 1 tablet (10 mg total) by mouth daily.   Spacer/Aero-Holding Chambers (AEROCHAMBER MV) inhaler Use as instructed   No facility-administered encounter medications on file as of 11/03/2022.     Lab Results  Component Value Date   WBC 9.5 09/01/2022   HGB 13.1 09/01/2022   HCT 39.6 09/01/2022   PLT 219.0 09/01/2022   GLUCOSE 104 (H) 11/02/2022   CHOL 135 11/01/2022   TRIG 63.0 11/01/2022   HDL 63.50 11/01/2022   LDLCALC 59 11/01/2022   ALT 13  11/01/2022   AST 16 11/01/2022   NA 133 (L) 11/02/2022   K 4.8 11/02/2022   CL 98 11/02/2022   CREATININE 0.87 11/02/2022   BUN 18 11/02/2022   CO2 25 11/02/2022   TSH 3.86 03/01/2022   INR 1.2 (H) 07/22/2020   HGBA1C 5.9 11/01/2022   MICROALBUR 0.9 10/26/2021    No results found.     Assessment & Plan:  Urinary hesitancy Assessment & Plan: Symptoms as outlined (intermittent worsening).  Last two episodes - no infection.  Discussed further w/up and evaluation. Question if not emptying bladder.  Discussed pelvic floor therapy.  Discussed urology referral.    Orders: -     Urinalysis, Routine w reflex microscopic -     Ambulatory referral to Urology  Type 2 diabetes mellitus with neurological complications (HCC) Assessment & Plan: Low carb diet and exercise as tolerated.  Follow met b and a1c.    Primary hypertension Assessment & Plan: Continue losartan and amlodipine.  Follow pressures. Follow metabolic panel.   Lab Results  Component Value Date   CREATININE 0.87 11/02/2022      Hypercholesteremia Assessment & Plan: On simvastatin.  Low cholesterol diet and exercise.  Follow lipid panel and liver function tests.     History of breast cancer Assessment & Plan: Mammogram 12/28/21 - birads I.    Essential hypertension Assessment & Plan: Continue losartan and amlodipine.  Blood pressure as outlined. .  Follow pressures.  Follow metabolic panel.    Diabetic polyneuropathy associated with type 2 diabetes mellitus (HCC) Assessment & Plan: Sugars have been well controlled.  Continue low carb diet and exercise.  Continue to use walker  for ambulation.  Follow met b and a1c.  Lab Results  Component Value Date   HGBA1C 5.9 11/01/2022      Cerebral atrophy [facility]) Assessment & Plan: Found on previous CT head.  Mild.  Stable.    Anemia, unspecified type Assessment & Plan: Follow cbc and ferritin       Dale [place] Rose, MD

## 2022-11-05 ENCOUNTER — Encounter: Payer: Self-pay | Admitting: Internal Medicine

## 2022-11-05 NOTE — Assessment & Plan Note (Signed)
Sugars have been well controlled.  Continue low carb diet and exercise.  Continue to use walker for ambulation.  Follow met b and a1c.  Lab Results  Component Value Date   HGBA1C 5.9 11/01/2022

## 2022-11-05 NOTE — Assessment & Plan Note (Signed)
Continue losartan and amlodipine.  Follow pressures. Follow metabolic panel.   Lab Results  Component Value Date   CREATININE 0.87 11/02/2022

## 2022-11-05 NOTE — Assessment & Plan Note (Signed)
Mammogram 12/28/21 - birads I.

## 2022-11-05 NOTE — Assessment & Plan Note (Signed)
Symptoms as outlined (intermittent worsening).  Last two episodes - no infection.  Discussed further w/up and evaluation. Question if not emptying bladder.  Discussed pelvic floor therapy.  Discussed urology referral.

## 2022-11-05 NOTE — Assessment & Plan Note (Signed)
Found on previous CT head.  Mild.  Stable.  

## 2022-11-05 NOTE — Assessment & Plan Note (Signed)
Low carb diet and exercise as tolerated.  Follow met b and a1c.

## 2022-11-05 NOTE — Assessment & Plan Note (Signed)
Follow cbc and ferritin.  

## 2022-11-05 NOTE — Assessment & Plan Note (Signed)
Continue losartan and amlodipine.  Blood pressure as outlined. .  Follow pressures.  Follow metabolic panel.  

## 2022-11-05 NOTE — Assessment & Plan Note (Signed)
On simvastatin.  Low cholesterol diet and exercise.  Follow lipid panel and liver function tests.   

## 2022-11-15 ENCOUNTER — Encounter: Payer: Self-pay | Admitting: Physician Assistant

## 2022-11-15 ENCOUNTER — Ambulatory Visit (INDEPENDENT_AMBULATORY_CARE_PROVIDER_SITE_OTHER): Payer: Medicare Other | Admitting: Physician Assistant

## 2022-11-15 ENCOUNTER — Telehealth: Payer: Self-pay

## 2022-11-15 VITALS — BP 156/83 | HR 96 | Wt 241.0 lb

## 2022-11-15 DIAGNOSIS — R3911 Hesitancy of micturition: Secondary | ICD-10-CM

## 2022-11-15 LAB — MICROSCOPIC EXAMINATION

## 2022-11-15 LAB — URINALYSIS, COMPLETE
Bilirubin, UA: NEGATIVE
Glucose, UA: NEGATIVE
Ketones, UA: NEGATIVE
Nitrite, UA: NEGATIVE
Protein,UA: NEGATIVE
RBC, UA: NEGATIVE
Specific Gravity, UA: 1.01 (ref 1.005–1.030)
Urobilinogen, Ur: 0.2 mg/dL (ref 0.2–1.0)
pH, UA: 6.5 (ref 5.0–7.5)

## 2022-11-15 LAB — BLADDER SCAN AMB NON-IMAGING

## 2022-11-15 NOTE — Progress Notes (Signed)
11/15/2022 12:06 PM   Anita Walker September 24, 1940 [id number]  CC: Chief Complaint  Patient presents with   urinary hesitancy   HPI: Anita Walker is a 82 y.o. female with PMH well-controlled diabetes, urinary incontinence s/p PTNS, and cystocele who presents today for evaluation of urinary hesitancy.  She was seen in clinic most recently on 02/05/2020 for monthly maintenance PTNS, but was subsequently lost to follow-up.  She is accompanied today by her caregiver, who contributes to HPI.  She was seen at [facility] urgent care on 10/05/2022 with concerns for UTI because she was voiding less frequently than her baseline, but still every 2 hours.  I do not see that a bladder scan was performed.  UA had trace leukocytes and 6-10 WBC/hpf with few bacteria and culture finalized with no growth.  She was seen at [facility] urgent care at [facility] on 10/22/2022 for the same.  Again, I do not see a bladder scan on her.  Urine culture grew mixed urogenital flora.  Today her caregiver reports that she tends to get rather fixated on her urinary schedule and panics when things do not go as predicted, assuming something must be wrong.  Patient reports nocturia x 3-4 and daytime frequency every hour, however there have been times where she has had urinary hesitancy and had a hard time getting started.  She denies dysuria or gross hematuria.  She denies vaginal bulging or pressure.  In-office catheterized UA today positive for trace leukocytes; urine microscopy pan negative.  Measured residual 175 mL.  PMH: Past Medical History:  Diagnosis Date   Anemia    Arthritis    Atrial fibrillation (HCC)    Breast cancer (HCC)    s/p lumpectomy 1992.  s/p chemo and xrt left breast   Diabetes mellitus (HCC)    Edema    feet/legs   Gastric ulcer    GERD (gastroesophageal reflux disease)    HOH (hard of hearing)    aides   Hypercholesterolemia    Hypertension    Personal history of chemotherapy    Pulmonary  emboli [facility])     Surgical History: Past Surgical History:  Procedure Laterality Date   BREAST LUMPECTOMY  03/06/1990   left breast   CATARACT EXTRACTION W/PHACO Right 12/26/2016   Procedure: CATARACT EXTRACTION PHACO AND INTRAOCULAR LENS PLACEMENT (IOC)-RIGHT DIABETIC;  Surgeon: Galen Manila, MD;  Location: [facility] ORS;  Service: Ophthalmology;  Laterality: Right;  [place] 00:47 AP% 24.5 CDE 11.62 Fluid pack lot # [id number]   CATARACT EXTRACTION W/PHACO Left 01/23/2017   Procedure: CATARACT EXTRACTION PHACO AND INTRAOCULAR LENS PLACEMENT (IOC);  Surgeon: Galen Manila, MD;  Location: [facility] ORS;  Service: Ophthalmology;  Laterality: Left;  [place] 00:50 AP% 16.1 CDE 8.18 Fluid pack lot #[id number]   COLONOSCOPY WITH PROPOFOL N/A 07/08/2019   Procedure: COLONOSCOPY WITH PROPOFOL;  Surgeon: Midge Minium, MD;  Location: [facility] ENDOSCOPY;  Service: Endoscopy;  Laterality: N/A;    Home Medications:  Allergies as of 11/15/2022       Reactions   Penicillins Other (See Comments)   Unknown- pt states been a long time ago  Has patient had a PCN reaction causing immediate rash, facial/tongue/throat swelling, SOB or lightheadedness with hypotension: Unknown Has patient had a PCN reaction causing severe rash involving mucus membranes or skin necrosis: Unknown Has patient had a PCN reaction that required hospitalization: Unknown Has patient had a PCN reaction occurring within the last 10 years: Unknown If all of the above answers are "  NO", then may proceed with Cephalosporin use.   Penicillin V Potassium Nausea And Vomiting        Medication List        Accurate as of November 15, 2022 12:06 PM. If you have any questions, ask your nurse or doctor.          AeroChamber MV inhaler Use as instructed   albuterol 108 (90 Base) MCG/ACT inhaler Commonly known as: VENTOLIN HFA Inhale 2 puffs into the lungs every 6 (six) hours as needed for wheezing or shortness of breath.   amLODipine 2.5 MG  tablet Commonly known as: NORVASC Take 1 tablet (2.5 mg total) by mouth daily.   Contour Test test strip Generic drug: glucose blood USE TO TEST BLOOD SUGAR ONCE DAILY   diphenhydrAMINE 25 mg capsule Commonly known as: BENADRYL Take 25 mg at bedtime as needed by mouth for sleep.   feeding supplement Liqd Take 237 mLs by mouth 2 (two) times daily between meals.   ferrous sulfate 325 (65 FE) MG tablet Commonly known as: FeroSul Take 1 tablet (325 mg total) by mouth every other day. One tablet qod   losartan 100 MG tablet Commonly known as: COZAAR TAKE 1 TABLET(100 MG) BY MOUTH DAILY   multivitamin with minerals Tabs tablet Take 1 tablet daily by mouth.   ondansetron 4 MG disintegrating tablet Commonly known as: ZOFRAN-ODT Take 1 tablet (4 mg total) by mouth every 8 (eight) hours as needed.   pantoprazole 40 MG tablet Commonly known as: PROTONIX TAKE 1 TABLET(40 MG) BY MOUTH DAILY   simvastatin 10 MG tablet Commonly known as: ZOCOR Take 1 tablet (10 mg total) by mouth daily.        Allergies:  Allergies  Allergen Reactions   Penicillins Other (See Comments)    Unknown- pt states been a long time ago  Has patient had a PCN reaction causing immediate rash, facial/tongue/throat swelling, SOB or lightheadedness with hypotension: Unknown Has patient had a PCN reaction causing severe rash involving mucus membranes or skin necrosis: Unknown Has patient had a PCN reaction that required hospitalization: Unknown Has patient had a PCN reaction occurring within the last 10 years: Unknown If all of the above answers are "NO", then may proceed with Cephalosporin use.   Penicillin V Potassium Nausea And Vomiting    Family History: Family History  Problem Relation Age of Onset   Heart disease Father    Heart disease Brother        s/p CABG   Breast cancer Daughter    Colon cancer Neg Hx     Social History:   reports that she has never smoked. She has never used  smokeless tobacco. She reports that she does not drink alcohol and does not use drugs.  Physical Exam: BP (!) 156/83   Pulse 96   Wt 241 lb (109.3 kg)   BMI 40.10 kg/m   Constitutional:  Alert and oriented, no acute distress, nontoxic appearing HEENT: [place], AT Cardiovascular: No clubbing, cyanosis, or edema Respiratory: Normal respiratory effort, no increased work of breathing Skin: No rashes, bruises or suspicious lesions Neurologic: Grossly intact, no focal deficits, moving all 4 extremities Psychiatric: Normal mood and affect  Laboratory Data: Results for orders placed or performed in visit on 11/15/22  Microscopic Examination   Urine  Result Value Ref Range   WBC, UA 0-5 0 - 5 /hpf   RBC, Urine 0-2 0 - 2 /hpf   Epithelial Cells (non renal) 0-10 0 -  10 /hpf   Bacteria, UA Few None seen/Few  Urinalysis, Complete  Result Value Ref Range   Specific Gravity, UA 1.010 1.005 - 1.030   pH, UA 6.5 5.0 - 7.5   Color, UA Yellow Yellow   Appearance Ur Clear Clear   Leukocytes,UA Trace (A) Negative   Protein,UA Negative Negative/Trace   Glucose, UA Negative Negative   Ketones, UA Negative Negative   RBC, UA Negative Negative   Bilirubin, UA Negative Negative   Urobilinogen, Ur 0.2 0.2 - 1.0 mg/dL   Nitrite, UA Negative Negative   Microscopic Examination See below:   Bladder Scan (Post Void Residual) in office  Result Value Ref Range   Scan Result 0ml    *Note: Due to a large number of results and/or encounters for the requested time period, some results have not been displayed. A complete set of results can be found in Results Review.   Assessment & Plan:   1. Urinary hesitancy Intermittent urinary hesitancy, typically with hourly daytime frequency and nocturia x 3-4.  Measured residual is WNL today.  I had a lengthy conversation with the patient and her caregiver today.  We discussed that we could try OAB agents for her daytime frequency and nocturia, however these could make  her hesitancy worse.  I am very reassured that her residual is normal today and she does not appear to be in urinary retention.  Given this, I would recommend conservative management.  We discussed return precautions including the inability to void for 12 hours at a time, pain with urination, blood with urination, or the painful inability to void.  They are in agreement with this plan. - Urinalysis, Complete - Bladder Scan (Post Void Residual) in office   Return if symptoms worsen or fail to improve.  Carman Ching, PA-C  [facility] [place] [address], [address] [place], [place] [address] [phone]

## 2022-11-15 NOTE — Telephone Encounter (Signed)
Patient daughter, Mardene Celeste, was concern for patients possible kidney disease due to twin sister having stage 3 CKD.  Samantha Vaillaincourt, PA stated that PCP did renal function testing and it was normal.  Mardene Celeste was pleased and said she did not know that the pcp did renal testing on her.

## 2022-11-15 NOTE — Telephone Encounter (Signed)
I called patient, and did not get to speak to patient, but spoke with daughter, Mardene Celeste , and spoke to her and gave her results per Urinalysis is clear no infection.

## 2022-11-29 ENCOUNTER — Other Ambulatory Visit: Payer: Self-pay | Admitting: Internal Medicine

## 2022-11-29 DIAGNOSIS — Z1231 Encounter for screening mammogram for malignant neoplasm of breast: Secondary | ICD-10-CM

## 2022-11-30 ENCOUNTER — Encounter: Payer: Self-pay | Admitting: *Deleted

## 2022-11-30 ENCOUNTER — Other Ambulatory Visit: Payer: Self-pay

## 2022-11-30 ENCOUNTER — Emergency Department
Admission: EM | Admit: 2022-11-30 | Discharge: 2022-11-30 | Disposition: A | Discharge status: Home / Self Care | Payer: Medicare Other | Attending: Emergency Medicine | Admitting: Emergency Medicine

## 2022-11-30 ENCOUNTER — Ambulatory Visit: Payer: Medicare Other

## 2022-11-30 ENCOUNTER — Emergency Department: Payer: Medicare Other

## 2022-11-30 DIAGNOSIS — I10 Essential (primary) hypertension: Secondary | ICD-10-CM | POA: Insufficient documentation

## 2022-11-30 DIAGNOSIS — Z20822 Contact with and (suspected) exposure to covid-19: Secondary | ICD-10-CM | POA: Insufficient documentation

## 2022-11-30 DIAGNOSIS — E119 Type 2 diabetes mellitus without complications: Secondary | ICD-10-CM | POA: Diagnosis not present

## 2022-11-30 DIAGNOSIS — E860 Dehydration: Secondary | ICD-10-CM | POA: Insufficient documentation

## 2022-11-30 DIAGNOSIS — R5383 Other fatigue: Secondary | ICD-10-CM | POA: Insufficient documentation

## 2022-11-30 DIAGNOSIS — R5381 Other malaise: Secondary | ICD-10-CM | POA: Diagnosis not present

## 2022-11-30 DIAGNOSIS — R110 Nausea: Secondary | ICD-10-CM | POA: Diagnosis not present

## 2022-11-30 DIAGNOSIS — J069 Acute upper respiratory infection, unspecified: Secondary | ICD-10-CM | POA: Diagnosis not present

## 2022-11-30 DIAGNOSIS — R630 Anorexia: Secondary | ICD-10-CM | POA: Diagnosis not present

## 2022-11-30 DIAGNOSIS — J849 Interstitial pulmonary disease, unspecified: Secondary | ICD-10-CM | POA: Diagnosis not present

## 2022-11-30 DIAGNOSIS — R531 Weakness: Secondary | ICD-10-CM | POA: Diagnosis not present

## 2022-11-30 DIAGNOSIS — K449 Diaphragmatic hernia without obstruction or gangrene: Secondary | ICD-10-CM | POA: Diagnosis not present

## 2022-11-30 LAB — URINALYSIS, W/ REFLEX TO CULTURE (INFECTION SUSPECTED)
Bilirubin Urine: NEGATIVE
Glucose, UA: NEGATIVE mg/dL
Hgb urine dipstick: NEGATIVE
Ketones, ur: 5 mg/dL — AB
Nitrite: NEGATIVE
Protein, ur: NEGATIVE mg/dL
Specific Gravity, Urine: 1.006 (ref 1.005–1.030)
pH: 7 (ref 5.0–8.0)

## 2022-11-30 LAB — CBC
HCT: 43.3 % (ref 36.0–46.0)
Hemoglobin: 14.6 g/dL (ref 12.0–15.0)
MCH: 29.3 pg (ref 26.0–34.0)
MCHC: 33.7 g/dL (ref 30.0–36.0)
MCV: 86.8 fL (ref 80.0–100.0)
Platelets: 222 10*3/uL (ref 150–400)
RBC: 4.99 MIL/uL (ref 3.87–5.11)
RDW: 14.5 % (ref 11.5–15.5)
WBC: 5.9 10*3/uL (ref 4.0–10.5)
nRBC: 0 % (ref 0.0–0.2)

## 2022-11-30 LAB — BASIC METABOLIC PANEL
Anion gap: 9 (ref 5–15)
BUN: 12 mg/dL (ref 8–23)
CO2: 25 mmol/L (ref 22–32)
Calcium: 9.2 mg/dL (ref 8.9–10.3)
Chloride: 97 mmol/L — ABNORMAL LOW (ref 98–111)
Creatinine, Ser: 0.66 mg/dL (ref 0.44–1.00)
GFR, Estimated: >60 mL/min (ref >60)
Glucose, Bld: 137 mg/dL — ABNORMAL HIGH (ref 70–99)
Potassium: 4.2 mmol/L (ref 3.5–5.1)
Sodium: 131 mmol/L — ABNORMAL LOW (ref 135–145)

## 2022-11-30 LAB — SARS CORONAVIRUS 2 BY RT PCR: SARS Coronavirus 2 by RT PCR: NEGATIVE

## 2022-11-30 MED ORDER — SODIUM CHLORIDE 0.9 % IV BOLUS
1000.0000 mL | Freq: Once | INTRAVENOUS | Status: AC
  Administered 2022-11-30: 1000 mL via INTRAVENOUS

## 2022-11-30 NOTE — ED Triage Notes (Addendum)
Pt states that she was feeling unwell this am.  She reports that she has has a URI and felt generally weak, fatigued and "drained" and had a hot flash while going to the bathroom.  She has not eaten or taken her am meds

## 2022-11-30 NOTE — ED Notes (Addendum)
First Nurse Note: Pt to ED via [facility] from home for generalized weakness and feeling unwell. Pt is c/o nausea. BP 183/87, CBG 142, other vitals stable. Pt is in NAD.

## 2022-11-30 NOTE — ED Provider Notes (Signed)
[facility] Provider Note    Event Date/Time   First MD Initiated Contact with Patient 11/30/22 1139     (approximate)   History   Chief Complaint: URI   HPI  Anita Walker is a 82 y.o. female with a history of hypertension atrial fibrillation diabetes and morbid obesity who is brought to the ED due to malaise, fatigue.  Reports poor oral intake over the past few days and urinary frequency.  Denies focal pain fever chills body aches or shortness of breath.     Physical Exam   Triage Vital Signs: ED Triage Vitals  Encounter Vitals Group     BP 11/30/22 1015 (!) 118/93     Systolic BP Percentile --      Diastolic BP Percentile --      Pulse Rate 11/30/22 1015 84     Resp 11/30/22 1015 16     Temp 11/30/22 1015 98.2 F (36.8 C)     Temp Source 11/30/22 1015 Oral     SpO2 11/30/22 1015 94 %     Weight --      Height --      Head Circumference --      Peak Flow --      Pain Score 11/30/22 1015 0     Pain Loc --      Pain Education --      Exclude from Growth Chart --     Most recent vital signs: Vitals:   11/30/22 1015 11/30/22 1213  BP: (!) 118/93 (!) 169/72  Pulse: 84 84  Resp: 16 16  Temp: 98.2 F (36.8 C)   SpO2: 94% 100%    General: Awake, no distress.  CV:  Good peripheral perfusion.  Regular rate.  Normal distal pulses Resp:  Normal effort.  Clear to auscultation bilaterally Abd:  No distention.  Soft nontender Other:  Dry oral mucosa   ED Results / Procedures / Treatments   Labs (all labs ordered are listed, but only abnormal results are displayed) Labs Reviewed  BASIC METABOLIC PANEL - Abnormal; Notable for the following components:      Result Value   Sodium 131 (*)    Chloride 97 (*)    Glucose, Bld 137 (*)    All other components within normal limits  URINALYSIS, W/ REFLEX TO CULTURE (INFECTION SUSPECTED) - Abnormal; Notable for the following components:   Color, Urine YELLOW (*)    APPearance CLEAR (*)     Ketones, ur 5 (*)    Leukocytes,Ua SMALL (*)    Bacteria, UA RARE (*)    All other components within normal limits  SARS CORONAVIRUS 2 BY RT PCR  URINE CULTURE  CBC     EKG    RADIOLOGY Chest x-ray interpreted by me, unremarkable.  Radiology report reviewed   PROCEDURES:  Procedures   MEDICATIONS ORDERED IN ED: Medications  sodium chloride 0.9 % bolus 1,000 mL (0 mLs Intravenous Stopped 11/30/22 1513)     IMPRESSION / MDM / ASSESSMENT AND PLAN / ED COURSE  I reviewed the triage vital signs and the nursing notes.  DDx: UTI, COVID, pneumonia, pleural effusion, AKI, electrolyte abnormality, anemia  Patient's presentation is most consistent with acute presentation with potential threat to life or bodily function.  Patient presents with fatigue, decreased oral intake, appears mildly dehydrated.  Given IV fluids and reports that she feels better.  Chest x-ray and lab workup are unremarkable. Member at bedside notes that  patient has seemed depressed for the last 2 years since the death of her twin sister, but has not been evaluated or treated by her doctor.  Sees Dr. Lorin Picket for primary care.  Will plan to follow-up for further assessment.       FINAL CLINICAL IMPRESSION(S) / ED DIAGNOSES   Final diagnoses:  Fatigue, unspecified type     Rx / DC Orders   ED Discharge Orders     None        Note:  This document was prepared using Dragon voice recognition software and may include unintentional dictation errors.   Sharman Cheek, MD 11/30/22 [phone]

## 2022-12-03 LAB — URINE CULTURE

## 2022-12-05 ENCOUNTER — Ambulatory Visit (INDEPENDENT_AMBULATORY_CARE_PROVIDER_SITE_OTHER): Payer: Medicare Other | Admitting: Internal Medicine

## 2022-12-05 VITALS — BP 128/70 | HR 81 | Temp 97.9°F | Resp 16 | Ht 65.0 in | Wt 247.0 lb

## 2022-12-05 DIAGNOSIS — R531 Weakness: Secondary | ICD-10-CM | POA: Diagnosis not present

## 2022-12-05 DIAGNOSIS — E871 Hypo-osmolality and hyponatremia: Secondary | ICD-10-CM | POA: Diagnosis not present

## 2022-12-05 DIAGNOSIS — D649 Anemia, unspecified: Secondary | ICD-10-CM | POA: Diagnosis not present

## 2022-12-05 DIAGNOSIS — E1149 Type 2 diabetes mellitus with other diabetic neurological complication: Secondary | ICD-10-CM

## 2022-12-05 DIAGNOSIS — Z23 Encounter for immunization: Secondary | ICD-10-CM

## 2022-12-05 DIAGNOSIS — I10 Essential (primary) hypertension: Secondary | ICD-10-CM | POA: Diagnosis not present

## 2022-12-05 DIAGNOSIS — Z853 Personal history of malignant neoplasm of breast: Secondary | ICD-10-CM | POA: Diagnosis not present

## 2022-12-05 DIAGNOSIS — E1142 Type 2 diabetes mellitus with diabetic polyneuropathy: Secondary | ICD-10-CM | POA: Diagnosis not present

## 2022-12-05 DIAGNOSIS — E7800 Pure hypercholesterolemia, unspecified: Secondary | ICD-10-CM | POA: Diagnosis not present

## 2022-12-05 DIAGNOSIS — R351 Nocturia: Secondary | ICD-10-CM | POA: Diagnosis not present

## 2022-12-05 DIAGNOSIS — G319 Degenerative disease of nervous system, unspecified: Secondary | ICD-10-CM | POA: Diagnosis not present

## 2022-12-05 NOTE — Progress Notes (Signed)
Subjective:    [name], female    DOB: 1941-02-08, 82 y.o.   MRN: [id number]  Patient here for  Chief Complaint  Patient presents with   Fatigue   Depression    HPI Here for work in appt. Was seen in the ER 11/30/22 - fatigue and decreased oral intake. Was given IVFs. Cxr and labs unremarkable - per report.  Reviewed labs - sodium low. She is accompanied by two daughters.  History obtained from all of them.  Family reports that she is not going out now.  She sits in her chair most of the day and sleeping in her chair.  Unable to get up now by herself.  Some weakness. No chest pain.  Breathing stable.  No increased cough or congestion. No abdominal pain.  Discussed possible sleep apnea.  Reports increased nocturia. Increased daytime fatigue.  Lower extremity swelling.    Past Medical History:  Diagnosis Date   Anemia    Arthritis    Atrial fibrillation (HCC)    Breast cancer (HCC)    s/p lumpectomy 1992.  s/p chemo and xrt left breast   Diabetes mellitus (HCC)    Edema    feet/legs   Gastric ulcer    GERD (gastroesophageal reflux disease)    HOH (hard of hearing)    aides   Hypercholesterolemia    Hypertension    Personal history of chemotherapy    Pulmonary emboli [facility])    Past Surgical History:  Procedure Laterality Date   BREAST LUMPECTOMY  03/06/1990   left breast   CATARACT EXTRACTION W/PHACO Right 12/26/2016   Procedure: CATARACT EXTRACTION PHACO AND INTRAOCULAR LENS PLACEMENT (IOC)-RIGHT DIABETIC;  Surgeon: Galen Manila, MD;  Location: [facility] ORS;  Service: Ophthalmology;  Laterality: Right;  [place] 00:47 AP% 24.5 CDE 11.62 Fluid pack lot # [id number]   CATARACT EXTRACTION W/PHACO Left 01/23/2017   Procedure: CATARACT EXTRACTION PHACO AND INTRAOCULAR LENS PLACEMENT (IOC);  Surgeon: Galen Manila, MD;  Location: [facility] ORS;  Service: Ophthalmology;  Laterality: Left;  [place] 00:50 AP% 16.1 CDE 8.18 Fluid pack lot #[id number]   COLONOSCOPY WITH PROPOFOL  N/A 07/08/2019   Procedure: COLONOSCOPY WITH PROPOFOL;  Surgeon: Midge Minium, MD;  Location: [facility] ENDOSCOPY;  Service: Endoscopy;  Laterality: N/A;   Family History  Problem Relation Age of Onset   Heart disease Father    Heart disease Brother        s/p CABG   Breast cancer Daughter    Colon cancer Neg Hx    Social History   Socioeconomic History   Marital status: Widowed    Spouse name: Not on file   Number of children: Not on file   Years of education: Not on file   Highest education level: 10th grade  Occupational History   Not on file  Tobacco Use   Smoking status: Never   Smokeless tobacco: Never  Vaping Use   Vaping status: Never Used  Substance and Sexual Activity   Alcohol use: No    Alcohol/week: 0.0 standard drinks of alcohol   Drug use: No   Sexual activity: Never  Other Topics Concern   Not on file  Social History Narrative   Not on file   Social Determinants of Health   Financial Resource Strain: Low Risk  (12/05/2022)   Overall Financial Resource Strain (CARDIA)    Difficulty of Paying Living Expenses: Not hard at all  Food Insecurity: No Food Insecurity (12/05/2022)   Hunger  Vital Sign    Worried About Programme researcher, broadcasting/film/video in the Last Year: Never true    Ran Out of Food in the Last Year: Never true  Transportation Needs: No Transportation Needs (12/05/2022)   PRAPARE - Administrator, Civil Service (Medical): No    Lack of Transportation (Non-Medical): No  Physical Activity: Inactive (12/05/2022)   Exercise Vital Sign    Days of Exercise per Week: 0 days    Minutes of Exercise per Session: 20 min  Stress: No Stress Concern Present (12/05/2022)   [employer] of Occupational Health - Occupational Stress Questionnaire    Feeling of Stress : Not at all  Social Connections: Socially Isolated (12/05/2022)   Social Connection and Isolation Panel [NHANES]    Frequency of Communication with Friends and Family: Never    Frequency of Social  Gatherings with Friends and Family: Never    Attends Religious Services: Never    Database administrator or Organizations: No    Attends Engineer, structural: Not on file    Marital Status: Widowed     Review of Systems  Constitutional:  Negative for appetite change and unexpected weight change.  HENT:  Negative for congestion and sinus pressure.   Respiratory:  Negative for cough and chest tightness.        Breathing stable.   Cardiovascular:  Positive for leg swelling. Negative for chest pain and palpitations.  Gastrointestinal:  Negative for abdominal pain, diarrhea, nausea and vomiting.  Genitourinary:  Negative for difficulty urinating and dysuria.  Musculoskeletal:  Negative for joint swelling and myalgias.  Skin:  Negative for color change and rash.  Neurological:  Negative for dizziness and headaches.  Psychiatric/Behavioral:  Negative for agitation and dysphoric mood.        Objective:     BP 128/70   Pulse 81   Temp 97.9 F (36.6 C)   Resp 16   Ht 5\' 5"  (1.651 m)   Wt 247 lb (112 kg)   SpO2 98%   BMI 41.10 kg/m  Wt Readings from Last 3 Encounters:  12/05/22 247 lb (112 kg)  11/15/22 241 lb (109.3 kg)  11/03/22 242 lb (109.8 kg)    Physical Exam Vitals reviewed.  Constitutional:      General: She is not in acute distress.    Appearance: Normal appearance.  HENT:     Head: Normocephalic and atraumatic.     Right Ear: External ear normal.     Left Ear: External ear normal.  Eyes:     General: No scleral icterus.       Right eye: No discharge.        Left eye: No discharge.     Conjunctiva/sclera: Conjunctivae normal.  Neck:     Thyroid: No thyromegaly.  Cardiovascular:     Rate and Rhythm: Normal rate and regular rhythm.  Pulmonary:     Effort: No respiratory distress.     Breath sounds: Normal breath sounds. No wheezing.  Abdominal:     General: Bowel sounds are normal.     Palpations: Abdomen is soft.     Tenderness: There is no  abdominal tenderness.  Musculoskeletal:        General: No tenderness.     Cervical back: Neck supple. No tenderness.     Comments: Pedal and lower extremity swelling.   Lymphadenopathy:     Cervical: No cervical adenopathy.  Skin:    Findings: No erythema or rash.  Neurological:     Mental Status: She is alert.  Psychiatric:        Mood and Affect: Mood normal.        Behavior: Behavior normal.      Outpatient Encounter Medications as of 12/05/2022  Medication Sig   amLODipine (NORVASC) 2.5 MG tablet Take 1 tablet (2.5 mg total) by mouth daily.   diphenhydrAMINE (BENADRYL) 25 mg capsule Take 25 mg at bedtime as needed by mouth for sleep.    feeding supplement (ENSURE ENLIVE / ENSURE PLUS) LIQD Take 237 mLs by mouth 2 (two) times daily between meals.   ferrous sulfate (FEROSUL) 325 (65 FE) MG tablet Take 1 tablet (325 mg total) by mouth every other day. One tablet qod   glucose blood (CONTOUR TEST) test strip USE TO TEST BLOOD SUGAR ONCE DAILY   losartan (COZAAR) 100 MG tablet TAKE 1 TABLET(100 MG) BY MOUTH DAILY   Multiple Vitamin (MULTIVITAMIN WITH MINERALS) TABS tablet Take 1 tablet daily by mouth.   pantoprazole (PROTONIX) 40 MG tablet TAKE 1 TABLET(40 MG) BY MOUTH DAILY   simvastatin (ZOCOR) 10 MG tablet Take 1 tablet (10 mg total) by mouth daily.   [DISCONTINUED] albuterol (VENTOLIN HFA) 108 (90 Base) MCG/ACT inhaler Inhale 2 puffs into the lungs every 6 (six) hours as needed for wheezing or shortness of breath.   [DISCONTINUED] ondansetron (ZOFRAN-ODT) 4 MG disintegrating tablet Take 1 tablet (4 mg total) by mouth every 8 (eight) hours as needed.   [DISCONTINUED] Spacer/Aero-Holding Chambers (AEROCHAMBER MV) inhaler Use as instructed   No facility-administered encounter medications on file as of 12/05/2022.     Lab Results  Component Value Date   WBC 5.9 11/30/2022   HGB 14.6 11/30/2022   HCT 43.3 11/30/2022   PLT 222 11/30/2022   GLUCOSE 120 (H) 12/05/2022   CHOL 135  11/01/2022   TRIG 63.0 11/01/2022   HDL 63.50 11/01/2022   LDLCALC 59 11/01/2022   ALT 13 11/01/2022   AST 16 11/01/2022   NA 131 (L) 12/05/2022   K 4.7 12/05/2022   CL 96 12/05/2022   CREATININE 0.79 12/05/2022   BUN 13 12/05/2022   CO2 27 12/05/2022   TSH 2.86 12/05/2022   INR 1.2 (H) 07/22/2020   HGBA1C 5.9 11/01/2022   MICROALBUR 0.9 10/26/2021    DG Chest 2 View  Result Date: 11/30/2022 CLINICAL DATA:  URI weakness.  Fatigue.  Hot flashes. EXAM: CHEST - 2 VIEW COMPARISON:  08/17/2022. FINDINGS: Redemonstration of chronically increased interstitial markings throughout bilateral lungs. No superimposed consolidation or major lung collapse. There are probable atelectatic changes at the left lung base secondary to compression from moderate to large hiatal hernia. Bilateral lung fields are clear. Bilateral costophrenic angles are clear. Stable cardio-mediastinal silhouette. No acute osseous abnormalities. The soft tissues are within normal limits. Redemonstration of moderate-to-large hiatal hernia. There are surgical staples in the left axillary region. IMPRESSION: No acute cardiopulmonary process. Chronic interstitial lung disease. Electronically Signed   By: Jules Schick M.D.   On: 11/30/2022 11:54       Assessment & Plan:  Hyponatremia -     Basic metabolic panel -     TSH  Need for influenza vaccination -     Flu Vaccine Trivalent High Dose (Fluad)  Anemia, unspecified type Assessment & Plan: Follow cbc and ferritin    Cerebral atrophy (HCC) Assessment & Plan: Found on previous CT head.  Mild.  Stable.    Diabetic polyneuropathy associated with type 2  diabetes mellitus (HCC) Assessment & Plan: Sugars have been well controlled.  Continue low carb diet and exercise.  Not moving around as much.  Not able to stand on her own.  Discussed home health.  Follow met b and a1c.  Lab Results  Component Value Date   HGBA1C 5.9 11/01/2022      Essential  hypertension Assessment & Plan: Continue losartan and amlodipine.  Blood pressure as outlined. .  Follow pressures.  Follow metabolic panel.    History of breast cancer Assessment & Plan: Mammogram 12/28/21 - birads I.    Hypercholesteremia Assessment & Plan: On simvastatin.  Low cholesterol diet and exercise.  Follow lipid panel and liver function tests.     Type 2 diabetes mellitus with neurological complications (HCC) Assessment & Plan: Low carb diet and exercise as tolerated.  Follow met b and a1c.   Orders: -     Ambulatory referral to Home Health  Weakness Assessment & Plan: Some leg weakness as outlined.  Unable to get up on her own.  Discussed leg strengthening and core strengthening - home health PT.  She is in agreement.    Orders: -     Ambulatory referral to Home Health  Nocturia Assessment & Plan: Nocturia, increased daytime fatigue, lower extremity swelling, not sleeping well - discussed concern regarding sleep apnea.  Agreeable for referral to pulmonary for evaluation.   Orders: -     Ambulatory referral to Pulmonology   I spent 45 minutes with the patient. Time spent discussing her current concerns and symptoms.  Specifically time spent discussing concerns regarding weakness, sleep issues. Time also spent discussing further w/up, evaluation and treatment.    Dale Fordoche, MD

## 2022-12-06 LAB — BASIC METABOLIC PANEL
BUN: 13 mg/dL (ref 6–23)
CO2: 27 meq/L (ref 19–32)
Calcium: 9.6 mg/dL (ref 8.4–10.5)
Chloride: 96 meq/L (ref 96–112)
Creatinine, Ser: 0.79 mg/dL (ref 0.40–1.20)
GFR: 68.55 mL/min (ref >60.00)
Glucose, Bld: 120 mg/dL — ABNORMAL HIGH (ref 70–99)
Potassium: 4.7 meq/L (ref 3.5–5.1)
Sodium: 131 meq/L — ABNORMAL LOW (ref 135–145)

## 2022-12-06 LAB — TSH: TSH: 2.86 u[IU]/mL (ref 0.35–5.50)

## 2022-12-07 ENCOUNTER — Telehealth: Payer: Self-pay

## 2022-12-07 DIAGNOSIS — E871 Hypo-osmolality and hyponatremia: Secondary | ICD-10-CM

## 2022-12-07 NOTE — Telephone Encounter (Signed)
Sodium order placed.

## 2022-12-07 NOTE — Telephone Encounter (Signed)
Pt daughter called back and I read the note and she verbalized understanding

## 2022-12-07 NOTE — Telephone Encounter (Signed)
-----   Message from [employer] sent at 12/06/2022 11:23 PM EDT ----- Notify - sodium is stable, but still slightly decreased.  Continue to encourage increased po intake.  Thyroid test wnl.  Will need to follow sodium.  Recheck sodium in the next 2-3 weeks.

## 2022-12-07 NOTE — Telephone Encounter (Signed)
Unable to leave vm due to vm not being set up. Called in regards to lab results will send a mychart message

## 2022-12-09 ENCOUNTER — Encounter: Payer: Self-pay | Admitting: Internal Medicine

## 2022-12-09 DIAGNOSIS — R351 Nocturia: Secondary | ICD-10-CM | POA: Insufficient documentation

## 2022-12-09 NOTE — Assessment & Plan Note (Signed)
Sugars have been well controlled.  Continue low carb diet and exercise.  Not moving around as much.  Not able to stand on her own.  Discussed home health.  Follow met b and a1c.  Lab Results  Component Value Date   HGBA1C 5.9 11/01/2022

## 2022-12-09 NOTE — Assessment & Plan Note (Signed)
Found on previous CT head.  Mild.  Stable.  

## 2022-12-09 NOTE — Assessment & Plan Note (Signed)
Continue losartan and amlodipine.  Blood pressure as outlined. .  Follow pressures.  Follow metabolic panel.  

## 2022-12-09 NOTE — Assessment & Plan Note (Signed)
Mammogram 12/28/21 - birads I.

## 2022-12-09 NOTE — Assessment & Plan Note (Signed)
Follow cbc and ferritin.  

## 2022-12-09 NOTE — Assessment & Plan Note (Signed)
Nocturia, increased daytime fatigue, lower extremity swelling, not sleeping well - discussed concern regarding sleep apnea.  Agreeable for referral to pulmonary for evaluation.

## 2022-12-09 NOTE — Assessment & Plan Note (Signed)
On simvastatin.  Low cholesterol diet and exercise.  Follow lipid panel and liver function tests.   

## 2022-12-09 NOTE — Assessment & Plan Note (Signed)
Some leg weakness as outlined.  Unable to get up on her own.  Discussed leg strengthening and core strengthening - home health PT.  She is in agreement.

## 2022-12-09 NOTE — Assessment & Plan Note (Signed)
Low carb diet and exercise as tolerated.  Follow met b and a1c.   

## 2022-12-12 ENCOUNTER — Encounter: Payer: Self-pay | Admitting: Internal Medicine

## 2022-12-12 DIAGNOSIS — R531 Weakness: Secondary | ICD-10-CM | POA: Diagnosis not present

## 2022-12-12 DIAGNOSIS — Z9842 Cataract extraction status, left eye: Secondary | ICD-10-CM | POA: Diagnosis not present

## 2022-12-12 DIAGNOSIS — F32A Depression, unspecified: Secondary | ICD-10-CM | POA: Diagnosis not present

## 2022-12-12 DIAGNOSIS — H9190 Unspecified hearing loss, unspecified ear: Secondary | ICD-10-CM | POA: Diagnosis not present

## 2022-12-12 DIAGNOSIS — Z853 Personal history of malignant neoplasm of breast: Secondary | ICD-10-CM | POA: Diagnosis not present

## 2022-12-12 DIAGNOSIS — E1142 Type 2 diabetes mellitus with diabetic polyneuropathy: Secondary | ICD-10-CM | POA: Diagnosis not present

## 2022-12-12 DIAGNOSIS — Z86711 Personal history of pulmonary embolism: Secondary | ICD-10-CM | POA: Diagnosis not present

## 2022-12-12 DIAGNOSIS — I10 Essential (primary) hypertension: Secondary | ICD-10-CM | POA: Diagnosis not present

## 2022-12-12 DIAGNOSIS — K219 Gastro-esophageal reflux disease without esophagitis: Secondary | ICD-10-CM | POA: Diagnosis not present

## 2022-12-12 DIAGNOSIS — Z9841 Cataract extraction status, right eye: Secondary | ICD-10-CM | POA: Diagnosis not present

## 2022-12-12 DIAGNOSIS — M1990 Unspecified osteoarthritis, unspecified site: Secondary | ICD-10-CM | POA: Diagnosis not present

## 2022-12-12 DIAGNOSIS — G319 Degenerative disease of nervous system, unspecified: Secondary | ICD-10-CM | POA: Diagnosis not present

## 2022-12-12 DIAGNOSIS — E7800 Pure hypercholesterolemia, unspecified: Secondary | ICD-10-CM | POA: Diagnosis not present

## 2022-12-12 DIAGNOSIS — D649 Anemia, unspecified: Secondary | ICD-10-CM | POA: Diagnosis not present

## 2022-12-12 DIAGNOSIS — I4891 Unspecified atrial fibrillation: Secondary | ICD-10-CM | POA: Diagnosis not present

## 2022-12-12 DIAGNOSIS — R351 Nocturia: Secondary | ICD-10-CM | POA: Diagnosis not present

## 2022-12-12 DIAGNOSIS — E871 Hypo-osmolality and hyponatremia: Secondary | ICD-10-CM | POA: Diagnosis not present

## 2022-12-13 ENCOUNTER — Emergency Department: Payer: Medicare Other

## 2022-12-13 ENCOUNTER — Other Ambulatory Visit: Payer: Self-pay

## 2022-12-13 DIAGNOSIS — E871 Hypo-osmolality and hyponatremia: Secondary | ICD-10-CM | POA: Diagnosis not present

## 2022-12-13 DIAGNOSIS — W19XXXA Unspecified fall, initial encounter: Secondary | ICD-10-CM | POA: Insufficient documentation

## 2022-12-13 DIAGNOSIS — I672 Cerebral atherosclerosis: Secondary | ICD-10-CM | POA: Diagnosis not present

## 2022-12-13 DIAGNOSIS — I6782 Cerebral ischemia: Secondary | ICD-10-CM | POA: Diagnosis not present

## 2022-12-13 DIAGNOSIS — I10 Essential (primary) hypertension: Secondary | ICD-10-CM | POA: Diagnosis not present

## 2022-12-13 DIAGNOSIS — S0990XA Unspecified injury of head, initial encounter: Secondary | ICD-10-CM | POA: Diagnosis not present

## 2022-12-13 DIAGNOSIS — Z853 Personal history of malignant neoplasm of breast: Secondary | ICD-10-CM | POA: Insufficient documentation

## 2022-12-13 DIAGNOSIS — M791 Myalgia, unspecified site: Secondary | ICD-10-CM | POA: Insufficient documentation

## 2022-12-13 DIAGNOSIS — R531 Weakness: Secondary | ICD-10-CM | POA: Diagnosis not present

## 2022-12-13 NOTE — ED Triage Notes (Signed)
BIB [facility] from home. Pt family left pt at home ~1800 and was found on floor upon family arrival at 34. Reports tripped over her feet while going to make food. Denies hitting head or LOC. Denies daily thinners. Pt alert and oriented x4 and following commands. Breathing unlabored with symmetric chest rise and fall. Pt reports diffuse soreness. Unsure if that is from fall or from PT that was done today. Pt ambulatory with assistance per EMS.    CBG 168 141/68 100% RA HR 92

## 2022-12-14 ENCOUNTER — Emergency Department
Admission: EM | Admit: 2022-12-14 | Discharge: 2022-12-14 | Disposition: A | Discharge status: Home / Self Care | Payer: Medicare Other | Attending: Emergency Medicine | Admitting: Emergency Medicine

## 2022-12-14 DIAGNOSIS — W19XXXA Unspecified fall, initial encounter: Secondary | ICD-10-CM

## 2022-12-14 DIAGNOSIS — I6782 Cerebral ischemia: Secondary | ICD-10-CM | POA: Diagnosis not present

## 2022-12-14 LAB — CBC WITH DIFFERENTIAL/PLATELET
Abs Immature Granulocytes: 0.03 10*3/uL (ref 0.00–0.07)
Basophils Absolute: 0 10*3/uL (ref 0.0–0.1)
Basophils Relative: 0 %
Eosinophils Absolute: 0 10*3/uL (ref 0.0–0.5)
Eosinophils Relative: 0 %
HCT: 41.9 % (ref 36.0–46.0)
Hemoglobin: 14.3 g/dL (ref 12.0–15.0)
Immature Granulocytes: 0 %
Lymphocytes Relative: 8 %
Lymphs Abs: 0.8 10*3/uL (ref 0.7–4.0)
MCH: 29.4 pg (ref 26.0–34.0)
MCHC: 34.1 g/dL (ref 30.0–36.0)
MCV: 86 fL (ref 80.0–100.0)
Monocytes Absolute: 1.1 10*3/uL — ABNORMAL HIGH (ref 0.1–1.0)
Monocytes Relative: 10 %
Neutro Abs: 8.9 10*3/uL — ABNORMAL HIGH (ref 1.7–7.7)
Neutrophils Relative %: 82 %
Platelets: 207 10*3/uL (ref 150–400)
RBC: 4.87 MIL/uL (ref 3.87–5.11)
RDW: 14.4 % (ref 11.5–15.5)
Smear Review: NORMAL
WBC: 10.9 10*3/uL — ABNORMAL HIGH (ref 4.0–10.5)
nRBC: 0 % (ref 0.0–0.2)

## 2022-12-14 LAB — BASIC METABOLIC PANEL
Anion gap: 7 (ref 5–15)
BUN: 13 mg/dL (ref 8–23)
CO2: 26 mmol/L (ref 22–32)
Calcium: 8.8 mg/dL — ABNORMAL LOW (ref 8.9–10.3)
Chloride: 97 mmol/L — ABNORMAL LOW (ref 98–111)
Creatinine, Ser: 0.61 mg/dL (ref 0.44–1.00)
GFR, Estimated: >60 mL/min (ref >60)
Glucose, Bld: 144 mg/dL — ABNORMAL HIGH (ref 70–99)
Potassium: 4.4 mmol/L (ref 3.5–5.1)
Sodium: 130 mmol/L — ABNORMAL LOW (ref 135–145)

## 2022-12-14 LAB — CK: Total CK: 170 U/L (ref 38–234)

## 2022-12-14 MED ORDER — ONDANSETRON 4 MG PO TBDP
4.0000 mg | ORAL_TABLET | Freq: Once | ORAL | Status: AC
  Administered 2022-12-14: 4 mg via ORAL
  Filled 2022-12-14: qty 1

## 2022-12-14 MED ORDER — ACETAMINOPHEN 500 MG PO TABS
1000.0000 mg | ORAL_TABLET | Freq: Once | ORAL | Status: AC
  Administered 2022-12-14: 1000 mg via ORAL
  Filled 2022-12-14: qty 2

## 2022-12-14 MED ORDER — IBUPROFEN 600 MG PO TABS
600.0000 mg | ORAL_TABLET | Freq: Once | ORAL | Status: AC
  Administered 2022-12-14: 600 mg via ORAL
  Filled 2022-12-14: qty 1

## 2022-12-14 NOTE — ED Provider Notes (Signed)
[facility] Provider Note    Event Date/Time   First MD Initiated Contact with Patient 12/14/22 0217     (approximate)   History   No chief complaint on file.   HPI  Anita Walker is a 82 y.o. female   Past medical history of atrial fibrillation not on blood thinners, hypertension, hyperlipidemia, remote breast cancer who presents with a fall at home.  Mechanical slip and fall, patient's family left her at around 6 and came back at 8:00 to find her on the floor.  She states that she was using her walker as she typically does, reached up to put food in the microwave and fell backwards and was unable to get up so crawled to the next room, and waited till her son got home.  Was on the ground for at least 1 hour.  Denies presyncopal symptoms like chest pain, palpitations, shortness of breath.  She denies hitting her head.  She has soreness all over.  She has no other acute medical complaints.  She has been able to ambulate with assistance.    External Medical Documents Reviewed: [facility] clinical summary for past medical history and medications      Physical Exam   Triage Vital Signs: ED Triage Vitals [12/13/22 2151]  Encounter Vitals Group     BP (!) 143/86     Systolic BP Percentile      Diastolic BP Percentile      Pulse Rate 98     Resp 18     Temp 98.2 F (36.8 C)     Temp Source Oral     SpO2 93 %     Weight 242 lb (109.8 kg)     Height 5\' 5"  (1.651 m)     Head Circumference      Peak Flow      Pain Score 6     Pain Loc      Pain Education      Exclude from Growth Chart     Most recent vital signs: Vitals:   12/13/22 2151 12/14/22 0402  BP: (!) 143/86 (!) 140/82  Pulse: 98 94  Resp: 18 18  Temp: 98.2 F (36.8 C) 98.2 F (36.8 C)  SpO2: 93% 94%    General: Awake, no distress.  CV:  Good peripheral perfusion.  Resp:  Normal effort.  Abd:  No distention. Other:  Awake alert answering questions appropriately,  hard of hearing.  Moves all extremities with full active range of motion neck supple full range of motion.  No obvious signs of head trauma, no C/T/L-spine step-off deformity or tenderness, and thorax, abdomen without signs of injury or tenderness to palpation.    ED Results / Procedures / Treatments   Labs (all labs ordered are listed, but only abnormal results are displayed) Labs Reviewed  BASIC METABOLIC PANEL - Abnormal; Notable for the following components:      Result Value   Sodium 130 (*)    Chloride 97 (*)    Glucose, Bld 144 (*)    Calcium 8.8 (*)    All other components within normal limits  CK  CBC WITH DIFFERENTIAL/PLATELET     I ordered and reviewed the above labs they are notable for mild hyponatremia, chronic and at baseline, normal CK.    RADIOLOGY I independently reviewed and interpreted CT scan of the head and see no obvious bleeding or midline shift I also reviewed radiologist's formal read.  PROCEDURES:  Critical Care performed: No  Procedures   MEDICATIONS ORDERED IN ED: Medications  ibuprofen (ADVIL) tablet 600 mg (600 mg Oral Given 12/14/22 0400)  acetaminophen (TYLENOL) tablet 1,000 mg (1,000 mg Oral Given 12/14/22 0400)  ondansetron (ZOFRAN-ODT) disintegrating tablet 4 mg (4 mg Oral Given by Other 12/14/22 0421)    IMPRESSION / MDM / ASSESSMENT AND PLAN / ED COURSE  I reviewed the triage vital signs and the nursing notes.                                Patient's presentation is most consistent with acute presentation with potential threat to life or bodily function.  Differential diagnosis includes, but is not limited to, chemical slip and fall leading to blunt traumatic injury including ICH, significant downtime leading to rhabdomyolysis or AKI or electrolyte disturbance.   The patient is on the cardiac monitor to evaluate for evidence of arrhythmia and/or significant heart rate changes.  MDM:    The patient was a mechanical slip  and fall at home with significant 1 to 2-hour downtime and muscle soreness everywhere, check CT head for bleeding which fortunately looks negative.  Otherwise no focal pain just soreness everywhere prolonged downtime check labs including CK for rhabdo or electrolyte disturbance.  EMS reports that she was able to get up and walk with walker, per baseline.  Will check ambulatory status prior to disposition.  Labs unremarkable, will ambulate with walker, discharge.       FINAL CLINICAL IMPRESSION(S) / ED DIAGNOSES   Final diagnoses:  Fall, initial encounter     Rx / DC Orders   ED Discharge Orders     None        Note:  This document was prepared using Dragon voice recognition software and may include unintentional dictation errors.    Pilar Jarvis, MD 12/14/22 [phone]

## 2022-12-14 NOTE — Telephone Encounter (Signed)
Noted  

## 2022-12-14 NOTE — ED Notes (Signed)
Pt cleaned of urine at this time, new brief placed on pt and clean linen on bed. Pt repositioned in bed for comfort.

## 2022-12-14 NOTE — Discharge Instructions (Addendum)
Fortunately your testing in the emergency department did not show any emergency conditions like major head injuries/brain bleeding, and your lab testing was reassuring.  Thank you for choosing [place] for your health care today!  Please see your primary doctor this week for a follow up appointment.   If you have any new, worsening, or unexpected symptoms call your doctor right away or come back to the emergency department for reevaluation.  It was my pleasure to care for you today.   Daneil Dan Modesto Charon, MD

## 2022-12-15 NOTE — Telephone Encounter (Signed)
Please call and confirm doing ok.  It sounds like PT is worked out.  Can schedule appt to reevaluate if needed.

## 2022-12-15 NOTE — Telephone Encounter (Signed)
FYI- patient was evaluated at the ER for unwitnessed fall. Per daughter, head CT negative. ED w/up was unrevealing of anything acute from fall.

## 2022-12-18 NOTE — Telephone Encounter (Signed)
Called [facility]. Unable to reach.

## 2022-12-19 ENCOUNTER — Telehealth: Payer: Self-pay | Admitting: Internal Medicine

## 2022-12-19 DIAGNOSIS — D649 Anemia, unspecified: Secondary | ICD-10-CM | POA: Diagnosis not present

## 2022-12-19 DIAGNOSIS — G319 Degenerative disease of nervous system, unspecified: Secondary | ICD-10-CM | POA: Diagnosis not present

## 2022-12-19 DIAGNOSIS — E871 Hypo-osmolality and hyponatremia: Secondary | ICD-10-CM | POA: Diagnosis not present

## 2022-12-19 DIAGNOSIS — I4891 Unspecified atrial fibrillation: Secondary | ICD-10-CM | POA: Diagnosis not present

## 2022-12-19 DIAGNOSIS — E1142 Type 2 diabetes mellitus with diabetic polyneuropathy: Secondary | ICD-10-CM | POA: Diagnosis not present

## 2022-12-19 DIAGNOSIS — I10 Essential (primary) hypertension: Secondary | ICD-10-CM | POA: Diagnosis not present

## 2022-12-19 NOTE — Telephone Encounter (Signed)
Agree with f/u to confirm doing ok.  Also need to know if home health/PT arranged.

## 2022-12-19 NOTE — Telephone Encounter (Signed)
Home health is involved. Discussed with Dr Lorin Picket.

## 2022-12-19 NOTE — Telephone Encounter (Signed)
See more recent phone note.

## 2022-12-19 NOTE — Telephone Encounter (Signed)
Called and spoke with daughter confirmed patient doing ok. Was a little sore the next day but ok with tylenol. Got [place] and [place].

## 2022-12-19 NOTE — Telephone Encounter (Signed)
Noreene Larsson from [facility] called stating the pt had a fall last wednesday trying to open up the microwave. Pt was not close enough and she lost her balance. Pt fell on her right side and bruised her left arm . Pt went to the ER and they found no cracks

## 2022-12-19 NOTE — Telephone Encounter (Signed)
FYI we were already aware of this from her daughter. I have reached out to daughter to follow up.

## 2022-12-20 ENCOUNTER — Other Ambulatory Visit: Payer: Self-pay

## 2022-12-20 ENCOUNTER — Emergency Department
Admission: EM | Admit: 2022-12-20 | Discharge: 2022-12-20 | Disposition: A | Discharge status: Home / Self Care | Payer: Medicare Other | Attending: Emergency Medicine | Admitting: Emergency Medicine

## 2022-12-20 ENCOUNTER — Encounter: Payer: Self-pay | Admitting: Emergency Medicine

## 2022-12-20 ENCOUNTER — Emergency Department: Payer: Medicare Other

## 2022-12-20 DIAGNOSIS — Z23 Encounter for immunization: Secondary | ICD-10-CM | POA: Diagnosis not present

## 2022-12-20 DIAGNOSIS — I10 Essential (primary) hypertension: Secondary | ICD-10-CM | POA: Diagnosis not present

## 2022-12-20 DIAGNOSIS — W19XXXA Unspecified fall, initial encounter: Secondary | ICD-10-CM

## 2022-12-20 DIAGNOSIS — R609 Edema, unspecified: Secondary | ICD-10-CM | POA: Diagnosis not present

## 2022-12-20 DIAGNOSIS — S81812A Laceration without foreign body, left lower leg, initial encounter: Secondary | ICD-10-CM | POA: Insufficient documentation

## 2022-12-20 DIAGNOSIS — W010XXA Fall on same level from slipping, tripping and stumbling without subsequent striking against object, initial encounter: Secondary | ICD-10-CM | POA: Diagnosis not present

## 2022-12-20 DIAGNOSIS — R58 Hemorrhage, not elsewhere classified: Secondary | ICD-10-CM | POA: Diagnosis not present

## 2022-12-20 LAB — CBC WITH DIFFERENTIAL/PLATELET
Abs Immature Granulocytes: 0.03 10*3/uL (ref 0.00–0.07)
Basophils Absolute: 0 10*3/uL (ref 0.0–0.1)
Basophils Relative: 1 %
Eosinophils Absolute: 0.2 10*3/uL (ref 0.0–0.5)
Eosinophils Relative: 3 %
HCT: 39.9 % (ref 36.0–46.0)
Hemoglobin: 13.3 g/dL (ref 12.0–15.0)
Immature Granulocytes: 0 %
Lymphocytes Relative: 16 %
Lymphs Abs: 1.2 10*3/uL (ref 0.7–4.0)
MCH: 29.2 pg (ref 26.0–34.0)
MCHC: 33.3 g/dL (ref 30.0–36.0)
MCV: 87.5 fL (ref 80.0–100.0)
Monocytes Absolute: 1.2 10*3/uL — ABNORMAL HIGH (ref 0.1–1.0)
Monocytes Relative: 17 %
Neutro Abs: 4.5 10*3/uL (ref 1.7–7.7)
Neutrophils Relative %: 63 %
Platelets: 230 10*3/uL (ref 150–400)
RBC: 4.56 MIL/uL (ref 3.87–5.11)
RDW: 15.1 % (ref 11.5–15.5)
WBC: 7.2 10*3/uL (ref 4.0–10.5)
nRBC: 0 % (ref 0.0–0.2)

## 2022-12-20 LAB — COMPREHENSIVE METABOLIC PANEL
ALT: 20 U/L (ref 0–44)
AST: 17 U/L (ref 15–41)
Albumin: 3.9 g/dL (ref 3.5–5.0)
Alkaline Phosphatase: 48 U/L (ref 38–126)
Anion gap: 8 (ref 5–15)
BUN: 16 mg/dL (ref 8–23)
CO2: 24 mmol/L (ref 22–32)
Calcium: 9.4 mg/dL (ref 8.9–10.3)
Chloride: 97 mmol/L — ABNORMAL LOW (ref 98–111)
Creatinine, Ser: 0.76 mg/dL (ref 0.44–1.00)
GFR, Estimated: >60 mL/min (ref >60)
Glucose, Bld: 110 mg/dL — ABNORMAL HIGH (ref 70–99)
Potassium: 4.6 mmol/L (ref 3.5–5.1)
Sodium: 129 mmol/L — ABNORMAL LOW (ref 135–145)
Total Bilirubin: 0.4 mg/dL (ref 0.3–1.2)
Total Protein: 7 g/dL (ref 6.5–8.1)

## 2022-12-20 LAB — TROPONIN I (HIGH SENSITIVITY)
Troponin I (High Sensitivity): 7 ng/L (ref <18)
Troponin I (High Sensitivity): 11 ng/L (ref <18)

## 2022-12-20 MED ORDER — TETANUS-DIPHTH-ACELL PERTUSSIS 5-2.5-18.5 LF-MCG/0.5 IM SUSY
0.5000 mL | PREFILLED_SYRINGE | Freq: Once | INTRAMUSCULAR | Status: AC
  Administered 2022-12-20: 0.5 mL via INTRAMUSCULAR
  Filled 2022-12-20: qty 0.5

## 2022-12-20 MED ORDER — LIDOCAINE-EPINEPHRINE 2 %-1:100000 IJ SOLN
20.0000 mL | Freq: Once | INTRAMUSCULAR | Status: AC
  Administered 2022-12-20: 20 mL
  Filled 2022-12-20: qty 1

## 2022-12-20 MED ORDER — LIDOCAINE-EPINEPHRINE (PF) 2 %-1:200000 IJ SOLN
INTRAMUSCULAR | Status: AC
  Filled 2022-12-20: qty 20

## 2022-12-20 NOTE — ED Notes (Signed)
Lab called for add on order of blood work.

## 2022-12-20 NOTE — ED Provider Notes (Addendum)
[facility] Provider Note    Event Date/Time   First MD Initiated Contact with Patient 12/20/22 1158     (approximate)   History   Fall   HPI  Anita Walker is a 82 y.o. female  with afib with morbid orbesity and comes in for a fall.  She fell on 10/10 for the same thing.  She was using her walker and tripped and hit her shin on the left.  Did not hit her head. Witnessed by sitter who didn't see her hit her head either. No blood thinners. No presyncope symptoms.  She was on ground for 10 minutes before EMS got her.    Physical Exam   Triage Vital Signs: ED Triage Vitals  Encounter Vitals Group     BP 12/20/22 1204 (!) 131/116     Systolic BP Percentile --      Diastolic BP Percentile --      Pulse Rate 12/20/22 1157 87     Resp 12/20/22 1157 18     Temp 12/20/22 1157 (!) 97.3 F (36.3 C)     Temp Source 12/20/22 1157 Oral     SpO2 12/20/22 1157 98 %     Weight 12/20/22 1153 242 lb (109.8 kg)     Height 12/20/22 1153 5\' 9"  (1.753 m)     Head Circumference --      Peak Flow --      Pain Score 12/20/22 1152 2     Pain Loc --      Pain Education --      Exclude from Growth Chart --     Most recent vital signs: Vitals:   12/20/22 1157 12/20/22 1204  BP:  (!) 131/116  Pulse: 87   Resp: 18   Temp: (!) 97.3 F (36.3 C)   SpO2: 98%      General: Awake, no distress.  CV:  Good peripheral perfusion.  Resp:  Normal effort.  Abd:  No distention.  Other:  Patient has about a 3 cm laceration noted on the left lower shin.  Good distal pulse.  She is able to lift both legs up off the bed.  No chest wall tenderness no abdominal tenderness.  No hematoma noted of the head.  She got no C-spine tenderness full range of motion of her neck.   ED Results / Procedures / Treatments   Labs (all labs ordered are listed, but only abnormal results are displayed) Labs Reviewed  CBC WITH DIFFERENTIAL/PLATELET - Abnormal; Notable for the following  components:      Result Value   Monocytes Absolute 1.2 (*)    All other components within normal limits  COMPREHENSIVE METABOLIC PANEL  TROPONIN I (HIGH SENSITIVITY)     EKG  My interpretation of EKG:  Normal sinus rate of 85, no st elevation, no twi normal intervals.  Repeat EKG sinus rate of 78 with occasional PVC.  Maybe a tiny bit of ST elevation in aVF and lead II, normal intervals  RADIOLOGY I have reviewed the xray personally and interpreted and no fracture  PROCEDURES:  Critical Care performed: No  ..Laceration Repair  Date/Time: 12/20/2022 1:57 PM  Performed by: Concha Se, MD Authorized by: Concha Se, MD   Consent:    Consent obtained:  Verbal   Consent given by:  Patient   Risks discussed:  Infection, pain and retained foreign body   Alternatives discussed:  No treatment Universal protocol:  Patient identity confirmed:  Verbally with patient Anesthesia:    Anesthesia method:  Local infiltration   Local anesthetic:  Lidocaine 2% WITH epi Laceration details:    Length (cm):  3 Exploration:    Contaminated: no   Treatment:    Area cleansed with:  Saline Skin repair:    Repair method:  Sutures   Suture size:  4-0   Suture material:  Prolene   Number of sutures:  3 Approximation:    Approximation:  Close Repair type:    Repair type:  Simple Post-procedure details:    Dressing:  Open (no dressing)    MEDICATIONS ORDERED IN ED: Medications  Tdap (BOOSTRIX) injection 0.5 mL (0.5 mLs Intramuscular Given 12/20/22 1226)     IMPRESSION / MDM / ASSESSMENT AND PLAN / ED COURSE  I reviewed the triage vital signs and the nursing notes.   Patient's presentation is most consistent with acute, uncomplicated illness.   Patient comes in with concerns for a fall with laceration to her left shin.  X-ray be ordered evaluate for fracture, dislocation.  She is able to lift the leg up off the bed so no signs of hip fracture.  Will update patient's  tetanus.  Patient's adamant that she did not hit her head and this was also witnessed by somebody who stated that it was not any head trauma to EMS.  She does have a small laceration that we will repair does not appear to be very deep.  Do not feel like this represents any type of open joint.  Good distal pulse.  She is got no tenderness elsewhere to suggest other injuries.  CBC was reassuring troponin was negative   Her sitter is at bedside who confirms that she did not hit her head and is acting her normal self  Her CMP shows slightly low sodium of 129 but it seems that she chronically runs a little low.  Did discuss this with patient she does not have an IV we discussed increasing a little bit of salt in her diet and she expressed understanding and given she is asymptomatic she can have this followed up outpatient.   Once her repeat troponin is negative will discharge patient.  Pt denies any urinary symptoms   FINAL CLINICAL IMPRESSION(S) / ED DIAGNOSES   Final diagnoses:  Fall, initial encounter  Laceration of left lower extremity, initial encounter     Rx / DC Orders   ED Discharge Orders     None        Note:  This document was prepared using Dragon voice recognition software and may include unintentional dictation errors.   Concha Se, MD 12/20/22 1405    Concha Se, MD 12/20/22 1448    Concha Se, MD 12/20/22 [phone]

## 2022-12-20 NOTE — ED Triage Notes (Signed)
Patient to ED via [facility] from home after fall up brick stairs. Lacerations noted to left lower leg- bleeding controlled. Denies hitting head or LOC. Unsure if takes blood thinners.

## 2022-12-20 NOTE — Discharge Instructions (Addendum)
You need to have the sutures removed in 7 to 10 days return to the ER if you develop worsening symptoms or any other concerns.  Sodium is slightly low and I would increase a little bit of salt in your diet and have this rechecked with your primary care doctor next week and return to the ER if you develop weakness, dizziness or any other concerns

## 2022-12-22 ENCOUNTER — Ambulatory Visit: Payer: Medicare Other | Admitting: Internal Medicine

## 2022-12-22 ENCOUNTER — Other Ambulatory Visit: Payer: Medicare Other

## 2022-12-22 VITALS — BP 116/72 | HR 89 | Temp 98.2°F | Resp 16 | Ht 69.0 in | Wt 245.0 lb

## 2022-12-22 DIAGNOSIS — R531 Weakness: Secondary | ICD-10-CM | POA: Diagnosis not present

## 2022-12-22 DIAGNOSIS — E871 Hypo-osmolality and hyponatremia: Secondary | ICD-10-CM | POA: Diagnosis not present

## 2022-12-22 DIAGNOSIS — I10 Essential (primary) hypertension: Secondary | ICD-10-CM | POA: Diagnosis not present

## 2022-12-22 DIAGNOSIS — F419 Anxiety disorder, unspecified: Secondary | ICD-10-CM | POA: Diagnosis not present

## 2022-12-22 DIAGNOSIS — E1142 Type 2 diabetes mellitus with diabetic polyneuropathy: Secondary | ICD-10-CM

## 2022-12-22 DIAGNOSIS — E7800 Pure hypercholesterolemia, unspecified: Secondary | ICD-10-CM

## 2022-12-22 DIAGNOSIS — R296 Repeated falls: Secondary | ICD-10-CM | POA: Diagnosis not present

## 2022-12-22 DIAGNOSIS — E1149 Type 2 diabetes mellitus with other diabetic neurological complication: Secondary | ICD-10-CM

## 2022-12-22 DIAGNOSIS — Z853 Personal history of malignant neoplasm of breast: Secondary | ICD-10-CM | POA: Diagnosis not present

## 2022-12-22 DIAGNOSIS — G319 Degenerative disease of nervous system, unspecified: Secondary | ICD-10-CM

## 2022-12-22 LAB — SODIUM: Sodium: 132 meq/L — ABNORMAL LOW (ref 135–145)

## 2022-12-22 MED ORDER — BUSPIRONE HCL 5 MG PO TABS: 5.0000 mg | ORAL_TABLET | Freq: Every day | ORAL | 1 refills | Status: DC

## 2022-12-22 NOTE — Progress Notes (Unsigned)
Subjective:    [name], female    DOB: 05-Nov-1940, 82 y.o.   MRN: [id number]  Patient here for  Chief Complaint  Patient presents with   Medical Management of Chronic Issues    HPI Work in appt - ER follow up. She is accompanied by her caretaker and both of her daughters are on speaker phone. Evaluated in ER 12/20/22 and 12/14/22 - for fall. Most recent visit (12/20/22) - laceration to left shin. No head injury. Sodium 129.  Discussed recent falls.  She has started working with PT.  Discussed the need to do the exercises when they are not with her.  Also discussed importance of getting up and walking around.  Family concerned regarding her not getting up and around and out as much.  Also concerned regarding some increased anxiety about falling and losing interest in doing things she enjoys.  Discussed her low sodium and the need to eat regular meals.  Limit increased free water.  No chest pain.  Breathing stable.  No abdominal pain or bowel change reported.  Increased lower extremity edema - stable.     Past Medical History:  Diagnosis Date   Anemia    Arthritis    Atrial fibrillation (HCC)    Breast cancer (HCC)    s/p lumpectomy 1992.  s/p chemo and xrt left breast   Diabetes mellitus (HCC)    Edema    feet/legs   Gastric ulcer    GERD (gastroesophageal reflux disease)    HOH (hard of hearing)    aides   Hypercholesterolemia    Hypertension    Personal history of chemotherapy    Pulmonary emboli [facility])    Past Surgical History:  Procedure Laterality Date   BREAST LUMPECTOMY  03/06/1990   left breast   CATARACT EXTRACTION W/PHACO Right 12/26/2016   Procedure: CATARACT EXTRACTION PHACO AND INTRAOCULAR LENS PLACEMENT (IOC)-RIGHT DIABETIC;  Surgeon: Galen Manila, MD;  Location: [facility] ORS;  Service: Ophthalmology;  Laterality: Right;  [place] 00:47 AP% 24.5 CDE 11.62 Fluid pack lot # [id number]   CATARACT EXTRACTION W/PHACO Left 01/23/2017   Procedure: CATARACT  EXTRACTION PHACO AND INTRAOCULAR LENS PLACEMENT (IOC);  Surgeon: Galen Manila, MD;  Location: [facility] ORS;  Service: Ophthalmology;  Laterality: Left;  [place] 00:50 AP% 16.1 CDE 8.18 Fluid pack lot #[id number]   COLONOSCOPY WITH PROPOFOL N/A 07/08/2019   Procedure: COLONOSCOPY WITH PROPOFOL;  Surgeon: Midge Minium, MD;  Location: [facility] ENDOSCOPY;  Service: Endoscopy;  Laterality: N/A;   Family History  Problem Relation Age of Onset   Heart disease Father    Heart disease Brother        s/p CABG   Breast cancer Daughter    Colon cancer Neg Hx    Social History   Socioeconomic History   Marital status: Widowed    Spouse name: Not on file   Number of children: Not on file   Years of education: Not on file   Highest education level: 10th grade  Occupational History   Not on file  Tobacco Use   Smoking status: Never   Smokeless tobacco: Never  Vaping Use   Vaping status: Never Used  Substance and Sexual Activity   Alcohol use: No    Alcohol/week: 0.0 standard drinks of alcohol   Drug use: No   Sexual activity: Never  Other Topics Concern   Not on file  Social History Narrative   Not on file   Social Determinants of  Health   Financial Resource Strain: Low Risk  (12/22/2022)   Overall Financial Resource Strain (CARDIA)    Difficulty of Paying Living Expenses: Not hard at all  Food Insecurity: No Food Insecurity (12/22/2022)   Hunger Vital Sign    Worried About Running Out of Food in the Last Year: Never true    Ran Out of Food in the Last Year: Never true  Transportation Needs: No Transportation Needs (12/22/2022)   PRAPARE - Administrator, Civil Service (Medical): No    Lack of Transportation (Non-Medical): No  Physical Activity: Inactive (12/22/2022)   Exercise Vital Sign    Days of Exercise per Week: 0 days    Minutes of Exercise per Session: 20 min  Stress: No Stress Concern Present (12/22/2022)   [employer] of Occupational Health - Occupational  Stress Questionnaire    Feeling of Stress : Only a little  Social Connections: Socially Isolated (12/22/2022)   Social Connection and Isolation Panel [NHANES]    Frequency of Communication with Friends and Family: Never    Frequency of Social Gatherings with Friends and Family: Never    Attends Religious Services: Never    Database administrator or Organizations: No    Attends Engineer, structural: Not on file    Marital Status: Widowed     Review of Systems  Constitutional:  Negative for fever and unexpected weight change.  HENT:  Negative for congestion and sinus pressure.   Respiratory:  Negative for cough, chest tightness and shortness of breath.   Cardiovascular:  Negative for chest pain and palpitations.  Gastrointestinal:  Negative for abdominal pain, diarrhea, nausea and vomiting.  Genitourinary:  Negative for difficulty urinating and dysuria.  Musculoskeletal:  Negative for joint swelling and myalgias.  Skin:  Negative for color change and rash.  Neurological:  Negative for dizziness and headaches.  Psychiatric/Behavioral:  Negative for agitation.        Increased anxiety as outlined.  Losing interest in doing things she used to enjoy.  Does not get out.        Objective:     BP 116/72   Pulse 89   Temp 98.2 F (36.8 C)   Resp 16   Ht 5\' 9"  (1.753 m)   Wt 245 lb (111.1 kg)   SpO2 98%   BMI 36.18 kg/m  Wt Readings from Last 3 Encounters:  12/22/22 245 lb (111.1 kg)  12/20/22 242 lb (109.8 kg)  12/13/22 242 lb (109.8 kg)    Physical Exam Vitals reviewed.  Constitutional:      General: She is not in acute distress.    Appearance: Normal appearance.  HENT:     Head: Normocephalic and atraumatic.     Right Ear: External ear normal.     Left Ear: External ear normal.  Eyes:     General: No scleral icterus.       Right eye: No discharge.        Left eye: No discharge.     Conjunctiva/sclera: Conjunctivae normal.  Neck:     Thyroid: No  thyromegaly.  Cardiovascular:     Rate and Rhythm: Normal rate and regular rhythm.  Pulmonary:     Effort: No respiratory distress.     Breath sounds: Normal breath sounds. No wheezing.  Abdominal:     General: Bowel sounds are normal.     Palpations: Abdomen is soft.     Tenderness: There is no abdominal tenderness.  Musculoskeletal:  Cervical back: Neck supple. No tenderness.     Comments: Increased lower extremity edema - chronic.    Lymphadenopathy:     Cervical: No cervical adenopathy.  Skin:    Findings: No erythema or rash.     Comments: Small laceration lower leg (sutures present).  No surrounding erythema.  Chronic venous stasis changes.   Neurological:     Mental Status: She is alert.  Psychiatric:        Mood and Affect: Mood normal.        Behavior: Behavior normal.      Outpatient Encounter Medications as of 12/22/2022  Medication Sig   busPIRone (BUSPAR) 5 MG tablet Take 1 tablet (5 mg total) by mouth daily.   amLODipine (NORVASC) 2.5 MG tablet Take 1 tablet (2.5 mg total) by mouth daily.   diphenhydrAMINE (BENADRYL) 25 mg capsule Take 25 mg at bedtime as needed by mouth for sleep.    feeding supplement (ENSURE ENLIVE / ENSURE PLUS) LIQD Take 237 mLs by mouth 2 (two) times daily between meals.   ferrous sulfate (FEROSUL) 325 (65 FE) MG tablet Take 1 tablet (325 mg total) by mouth every other day. One tablet qod   glucose blood (CONTOUR TEST) test strip USE TO TEST BLOOD SUGAR ONCE DAILY   losartan (COZAAR) 100 MG tablet TAKE 1 TABLET(100 MG) BY MOUTH DAILY   Multiple Vitamin (MULTIVITAMIN WITH MINERALS) TABS tablet Take 1 tablet daily by mouth.   pantoprazole (PROTONIX) 40 MG tablet TAKE 1 TABLET(40 MG) BY MOUTH DAILY   simvastatin (ZOCOR) 10 MG tablet Take 1 tablet (10 mg total) by mouth daily.   No facility-administered encounter medications on file as of 12/22/2022.     Lab Results  Component Value Date   WBC 7.2 12/20/2022   HGB 13.3 12/20/2022    HCT 39.9 12/20/2022   PLT 230 12/20/2022   GLUCOSE 110 (H) 12/20/2022   CHOL 135 11/01/2022   TRIG 63.0 11/01/2022   HDL 63.50 11/01/2022   LDLCALC 59 11/01/2022   ALT 20 12/20/2022   AST 17 12/20/2022   NA 132 (L) 12/22/2022   K 4.6 12/20/2022   CL 97 (L) 12/20/2022   CREATININE 0.76 12/20/2022   BUN 16 12/20/2022   CO2 24 12/20/2022   TSH 2.86 12/05/2022   INR 1.2 (H) 07/22/2020   HGBA1C 5.9 11/01/2022   MICROALBUR 0.9 10/26/2021    DG Tibia/Fibula Left  Result Date: 12/20/2022 CLINICAL DATA:  Fall with laceration to left lower leg on lateral side EXAM: LEFT TIBIA AND FIBULA - 2 VIEW COMPARISON:  None Available. FINDINGS: No acute fracture or dislocation. Remote posttraumatic change about the distal tibia. Laceration about the lateral calf. IMPRESSION: No acute fracture or dislocation. Electronically Signed   By: Minerva Fester M.D.   On: 12/20/2022 15:02       Assessment & Plan:  Hyponatremia Assessment & Plan: Discussed recent low sodium.  Discussed importance of eating regular meals.  Limit increased free water.  Recheck sodium today.    Orders: -     Sodium  Weakness Assessment & Plan: Leg weakness. Unable to stand from seated position without assistance.  PT has started working with her. Discussed the need to do exercises they show her - on a daily basis and multiple times per day.     Type 2 diabetes mellitus with neurological complications (HCC) Assessment & Plan: Low carb diet and exercise as tolerated.  Follow met b and a1c.    Recurrent  falls Assessment & Plan: Discussed.  PT has started working with her.  Follow.    Primary hypertension Assessment & Plan: Continue losartan and amlodipine.  Follow pressures. Follow metabolic panel.   Lab Results  Component Value Date   CREATININE 0.76 12/20/2022      Hypercholesteremia Assessment & Plan: On simvastatin.  Low cholesterol diet and exercise.  Follow lipid panel and liver function tests.      History of breast cancer Assessment & Plan: Mammogram 12/28/21 - birads I.    Diabetic polyneuropathy associated with type 2 diabetes mellitus (HCC) Assessment & Plan: Sugars have been well controlled.  Continue low carb diet and exercise.  Not moving around as much.  PT has started working on strengthening and gait. Follow met b and a1c.  Lab Results  Component Value Date   HGBA1C 5.9 11/01/2022      Cerebral atrophy [facility]) Assessment & Plan: Found on previous CT head.  Mild.  Stable.    Anxiety Assessment & Plan: Increased anxiety about falling.  Concern regarding not getting up and going out.  Family feels she needs to be on something to help level things out.  Discussed possible side effects of medication given recent fall. Discussed low sodium. Discussed buspar.  Will start 5mg  q day.  Follow.    Other orders -     busPIRone HCl; Take 1 tablet (5 mg total) by mouth daily.  Dispense: 30 tablet; Refill: 1   I spent 45 minutes with the patient. Time spent discussing her current concerns and symptoms. Specifically time spent discussing concerns regarding her not getting up and moving around, going out, increased anxiety.  Time also spent discussing further w/up, evaluation and treatment.    Dale Richwood, MD

## 2022-12-24 ENCOUNTER — Encounter: Payer: Self-pay | Admitting: Internal Medicine

## 2022-12-24 DIAGNOSIS — F419 Anxiety disorder, unspecified: Secondary | ICD-10-CM | POA: Insufficient documentation

## 2022-12-24 NOTE — Assessment & Plan Note (Signed)
Found on previous CT head.  Mild.  Stable.  

## 2022-12-24 NOTE — Assessment & Plan Note (Signed)
Leg weakness. Unable to stand from seated position without assistance.  PT has started working with her. Discussed the need to do exercises they show her - on a daily basis and multiple times per day.

## 2022-12-24 NOTE — Assessment & Plan Note (Signed)
On simvastatin.  Low cholesterol diet and exercise.  Follow lipid panel and liver function tests.   

## 2022-12-24 NOTE — Assessment & Plan Note (Signed)
Increased anxiety about falling.  Concern regarding not getting up and going out.  Family feels she needs to be on something to help level things out.  Discussed possible side effects of medication given recent fall. Discussed low sodium. Discussed buspar.  Will start 5mg  q day.  Follow.

## 2022-12-24 NOTE — Assessment & Plan Note (Signed)
Low carb diet and exercise as tolerated.  Follow met b and a1c.

## 2022-12-24 NOTE — Assessment & Plan Note (Signed)
Discussed.  PT has started working with her.  Follow.

## 2022-12-24 NOTE — Assessment & Plan Note (Signed)
Sugars have been well controlled.  Continue low carb diet and exercise.  Not moving around as much.  PT has started working on strengthening and gait. Follow met b and a1c.  Lab Results  Component Value Date   HGBA1C 5.9 11/01/2022

## 2022-12-24 NOTE — Assessment & Plan Note (Signed)
Mammogram 12/28/21 - birads I.

## 2022-12-24 NOTE — Assessment & Plan Note (Signed)
Continue losartan and amlodipine.  Follow pressures. Follow metabolic panel.   Lab Results  Component Value Date   CREATININE 0.76 12/20/2022

## 2022-12-24 NOTE — Assessment & Plan Note (Signed)
Discussed recent low sodium.  Discussed importance of eating regular meals.  Limit increased free water.  Recheck sodium today.

## 2022-12-26 DIAGNOSIS — I4891 Unspecified atrial fibrillation: Secondary | ICD-10-CM | POA: Diagnosis not present

## 2022-12-26 DIAGNOSIS — D649 Anemia, unspecified: Secondary | ICD-10-CM | POA: Diagnosis not present

## 2022-12-26 DIAGNOSIS — I10 Essential (primary) hypertension: Secondary | ICD-10-CM | POA: Diagnosis not present

## 2022-12-26 DIAGNOSIS — G319 Degenerative disease of nervous system, unspecified: Secondary | ICD-10-CM | POA: Diagnosis not present

## 2022-12-26 DIAGNOSIS — E871 Hypo-osmolality and hyponatremia: Secondary | ICD-10-CM | POA: Diagnosis not present

## 2022-12-26 DIAGNOSIS — E1142 Type 2 diabetes mellitus with diabetic polyneuropathy: Secondary | ICD-10-CM | POA: Diagnosis not present

## 2022-12-27 ENCOUNTER — Telehealth: Payer: Self-pay | Admitting: Internal Medicine

## 2022-12-27 NOTE — Telephone Encounter (Signed)
Anita Walker with [facility] calling to report fall from last week. We are already aware. Pt has been evaluated at ED and in office.

## 2022-12-27 NOTE — Telephone Encounter (Signed)
Anita Walker from medical access called. She wanted to let provider know about Anita Walker. She said she fell going up her stairs one day last week and hurt her leg. She wanted to let provider know. [name]'s number is [phone]. If you have any questions.

## 2022-12-28 ENCOUNTER — Ambulatory Visit: Payer: Medicare Other

## 2022-12-29 ENCOUNTER — Ambulatory Visit
Admission: RE | Admit: 2022-12-29 | Discharge: 2022-12-29 | Disposition: A | Discharge status: Home / Self Care | Payer: Medicare Other | Source: Ambulatory Visit | Attending: Physician Assistant | Admitting: Physician Assistant

## 2022-12-29 VITALS — BP 152/82 | HR 89 | Temp 97.6°F | Resp 15 | Ht 69.0 in | Wt 244.9 lb

## 2022-12-29 DIAGNOSIS — E1169 Type 2 diabetes mellitus with other specified complication: Secondary | ICD-10-CM | POA: Diagnosis not present

## 2022-12-29 DIAGNOSIS — L03116 Cellulitis of left lower limb: Secondary | ICD-10-CM | POA: Diagnosis not present

## 2022-12-29 DIAGNOSIS — Z5189 Encounter for other specified aftercare: Secondary | ICD-10-CM

## 2022-12-29 MED ORDER — MUPIROCIN 2 % EX OINT: 1.0000 | TOPICAL_OINTMENT | Freq: Two times a day (BID) | CUTANEOUS | 0 refills | Status: DC

## 2022-12-29 MED ORDER — DOXYCYCLINE HYCLATE 100 MG PO CAPS: 100.0000 mg | ORAL_CAPSULE | Freq: Two times a day (BID) | ORAL | 0 refills | Status: AC

## 2022-12-29 NOTE — ED Triage Notes (Signed)
Patient was seen at [facility] ED for laceration to her left lower leg.  Patient is here for wound check and possible suture removal.

## 2022-12-29 NOTE — ED Provider Notes (Signed)
[facility]    CSN: [id number] Arrival date & time: 12/29/22  1021      History   Chief Complaint Chief Complaint  Patient presents with   Wound Check    Appointment    HPI Anita Walker is a 82 y.o. female presenting for suture removal of a wound to the left lower leg.  3 sutures were placed in the emergency department after a fall on 10/16.  A caregiver is with the patient.  They report noticing some redness and swelling of the lower leg several days ago and are unsure if it has gotten worse.  History of edema of lower extremities.  No drainage from the wound.  The area is sore.  Denies fever.  Medical history significant for diabetes, hypertension, GERD, history of PE, breast cancer, atrial fibrillation, hyperlipidemia.  HPI  Past Medical History:  Diagnosis Date   Anemia    Arthritis    Atrial fibrillation (HCC)    Breast cancer (HCC)    s/p lumpectomy 1992.  s/p chemo and xrt left breast   Diabetes mellitus (HCC)    Edema    feet/legs   Gastric ulcer    GERD (gastroesophageal reflux disease)    HOH (hard of hearing)    aides   Hypercholesterolemia    Hypertension    Personal history of chemotherapy    Pulmonary emboli [facility])     Patient Active Problem List   Diagnosis Date Noted   Anxiety 12/24/2022   Nocturia 12/09/2022   Hyponatremia 12/05/2022   Urinary hesitancy 11/03/2022   Urinary urgency 08/16/2022   Hearing loss 04/26/2022   Congestion of nasal sinus 04/26/2022   Recurrent falls 07/08/2021   Weakness of both lower extremities 07/08/2021   Closed superior and inferior pubic rami fractures, sequela (Right) 12/15/2020   Low back strain 11/15/2020   Lumbar spondylosis 11/15/2020   History of pelvic fracture 07/04/2020   Anemia 07/04/2020   Cerebral atrophy (HCC) 03/20/2020   Chronic low back pain (Midline) w/o sciatica 01/26/2020   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   Kissing spine syndrome (L3-S1) 01/19/2020   Grade 1  Retrolisthesis of L3/L4 12/18/2019   Lip lesion 12/14/2019   Acute exacerbation of chronic low back pain 12/11/2019   At high risk for falls 12/11/2019   Fall at home, sequela 12/11/2019   Head injury 12/02/2019   Bradycardia following surgery 11/18/2019   Wrist fracture 11/02/2019   Weakness 11/02/2019   Change in bowel movement 11/02/2019   DDD (degenerative disc disease), lumbosacral 09/18/2019   Irritability and anger 09/18/2019   Nausea without vomiting 09/18/2019   History of vasovagal symptom 08/14/2019    Class: History of   Polyp of descending colon    Positive colorectal cancer screening using Cologuard test 05/25/2019   Spondylosis without myelopathy or radiculopathy, lumbosacral region 03/11/2019   Chronic 50% compression fracture of L3 lumbar vertebra, sequela, w/ retropulsion 02/18/2019   Compression fracture of thoracolumbar vertebra (T11), sequela 02/18/2019   Lumbar facet arthropathy (Multilevel) 02/18/2019   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, 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) 02/18/2019   L5-S1 disc-osteophyte complex (Right) 02/18/2019   Lumbar facet syndrome (Bilateral) (R>L) 02/18/2019   Chronic anticoagulation (Coumadin) 02/18/2019   Chronic low back pain (Bilateral) (R>L) w/o sciatica 02/18/2019   Chronic pain syndrome 02/18/2019   Compression fracture of thoracic vertebra (HCC) 10/12/2018   Pain due to onychomycosis of toenails of both feet 09/02/2018   Constipation 08/18/2018   Fall 08/18/2018  Diabetic polyneuropathy associated with type 2 diabetes mellitus (HCC) 03/18/2017   Abnormal chest CT 03/18/2017   Urinary frequency 01/21/2017   Syncope 11/10/2016   Right hip pain 10/30/2016   Cough 10/25/2016   Bronchitis 04/16/2015   Health care maintenance 05/23/2014   Mixed emotional features as adjustment reaction 07/20/2013   Long term current use of anticoagulant therapy 04/11/2013   Hypertension 01/01/2012   Hypercholesteremia 01/01/2012   History of breast  cancer 01/01/2012   Essential hypertension 01/01/2012   Pure hypercholesterolemia 01/01/2012   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    Past Surgical History:  Procedure Laterality Date   BREAST LUMPECTOMY  03/06/1990   left breast   CATARACT EXTRACTION W/PHACO Right 12/26/2016   Procedure: CATARACT EXTRACTION PHACO AND INTRAOCULAR LENS PLACEMENT (IOC)-RIGHT DIABETIC;  Surgeon: Galen Manila, MD;  Location: [facility] ORS;  Service: Ophthalmology;  Laterality: Right;  [place] 00:47 AP% 24.5 CDE 11.62 Fluid pack lot # [id number]   CATARACT EXTRACTION W/PHACO Left 01/23/2017   Procedure: CATARACT EXTRACTION PHACO AND INTRAOCULAR LENS PLACEMENT (IOC);  Surgeon: Galen Manila, MD;  Location: [facility] ORS;  Service: Ophthalmology;  Laterality: Left;  [place] 00:50 AP% 16.1 CDE 8.18 Fluid pack lot #[id number]   COLONOSCOPY WITH PROPOFOL N/A 07/08/2019   Procedure: COLONOSCOPY WITH PROPOFOL;  Surgeon: Midge Minium, MD;  Location: [facility] ENDOSCOPY;  Service: Endoscopy;  Laterality: N/A;    OB History   No obstetric history on file.      Home Medications    Prior to Admission medications   Medication Sig Start Date End Date Taking? Authorizing Provider  doxycycline (VIBRAMYCIN) 100 MG capsule Take 1 capsule (100 mg total) by mouth 2 (two) times daily for 7 days. 12/29/22 01/05/23 Yes Shirlee Latch, PA-C  mupirocin ointment (BACTROBAN) 2 % Apply 1 Application topically 2 (two) times daily. 12/29/22  Yes Eusebio Friendly B, PA-C  amLODipine (NORVASC) 2.5 MG tablet Take 1 tablet (2.5 mg total) by mouth daily. 06/21/22   Dale [place], MD  busPIRone (BUSPAR) 5 MG tablet Take 1 tablet (5 mg total) by mouth daily. 12/22/22   Dale [place], MD  diphenhydrAMINE (BENADRYL) 25 mg capsule Take 25 mg at bedtime as needed by mouth for sleep.     [provider]  feeding supplement (ENSURE ENLIVE / ENSURE PLUS) LIQD Take 237 mLs by mouth 2 (two) times daily between meals.  04/19/20   Enedina Finner, MD  ferrous sulfate (FEROSUL) 325 (65 FE) MG tablet Take 1 tablet (325 mg total) by mouth every other day. One tablet qod 06/21/22   Dale [place], MD  glucose blood (CONTOUR TEST) test strip USE TO TEST BLOOD SUGAR ONCE DAILY 04/20/22   Dale [place], MD  losartan (COZAAR) 100 MG tablet TAKE 1 TABLET(100 MG) BY MOUTH DAILY 03/13/22   Dale [place], MD  Multiple Vitamin (MULTIVITAMIN WITH MINERALS) TABS tablet Take 1 tablet daily by mouth.    [provider]  pantoprazole (PROTONIX) 40 MG tablet TAKE 1 TABLET(40 MG) BY MOUTH DAILY 08/16/22   Dale [place], MD  simvastatin (ZOCOR) 10 MG tablet Take 1 tablet (10 mg total) by mouth daily. 06/21/22   Dale [place] Walk, MD    Family History Family History  Problem Relation Age of Onset   Heart disease Father    Heart disease Brother        s/p CABG   Breast cancer Daughter    Colon cancer Neg Hx     Social History Social History  Tobacco Use   Smoking status: Never   Smokeless tobacco: Never  Vaping Use   Vaping status: Never Used  Substance Use Topics   Alcohol use: No    Alcohol/week: 0.0 standard drinks of alcohol   Drug use: No     Allergies   Penicillins and Penicillin v potassium   Review of Systems Review of Systems  Constitutional:  Negative for fatigue and fever.  Cardiovascular:  Positive for leg swelling.  Musculoskeletal:  Positive for arthralgias and joint swelling. Negative for gait problem.  Skin:  Positive for color change and wound.  Neurological:  Negative for weakness and numbness.     Physical Exam Triage Vital Signs ED Triage Vitals  Encounter Vitals Group     BP 12/29/22 1030 (!) 152/82     Systolic BP Percentile --      Diastolic BP Percentile --      Pulse Rate 12/29/22 1030 89     Resp 12/29/22 1030 15     Temp 12/29/22 1030 97.6 F (36.4 C)     Temp Source 12/29/22 1030 Oral     SpO2 12/29/22 1030 93 %     Weight 12/29/22 1029 244 lb 14.9 oz (111.1 kg)      Height 12/29/22 1029 5\' 9"  (1.753 m)     Head Circumference --      Peak Flow --      Pain Score 12/29/22 1028 0     Pain Loc --      Pain Education --      Exclude from Growth Chart --    No data found.  Updated Vital Signs BP (!) 152/82 (BP Location: Right Arm)   Pulse 89   Temp 97.6 F (36.4 C) (Oral)   Resp 15   Ht 5\' 9"  (1.753 m)   Wt 244 lb 14.9 oz (111.1 kg)   SpO2 93%   BMI 36.17 kg/m    Physical Exam Vitals and nursing note reviewed.  Constitutional:      General: She is not in acute distress.    Appearance: Normal appearance. She is not ill-appearing or toxic-appearing.  HENT:     Head: Normocephalic and atraumatic.  Eyes:     General:        Right eye: No discharge.        Left eye: No discharge.     Conjunctiva/sclera: Conjunctivae normal.  Cardiovascular:     Rate and Rhythm: Normal rate.     Pulses: Normal pulses.  Pulmonary:     Effort: Pulmonary effort is normal. No respiratory distress.  Musculoskeletal:     Cervical back: Neck supple.  Skin:    General: Skin is dry.     Findings: Erythema present.     Comments: Wound noted to left lateral lower leg with surrounding erythema/swelling/warmth and tenderness  Neurological:     General: No focal deficit present.     Mental Status: She is alert. Mental status is at baseline.     Motor: No weakness.     Gait: Gait normal.  Psychiatric:        Mood and Affect: Mood normal.        Behavior: Behavior normal.      UC Treatments / Results  Labs (all labs ordered are listed, but only abnormal results are displayed) Labs Reviewed - No data to display  EKG   Radiology No results found.  Procedures Procedures (including critical care time)  Medications Ordered in UC  Medications - No data to display  Initial Impression / Assessment and Plan / UC Course  I have reviewed the triage vital signs and the nursing notes.  Pertinent labs & imaging results that were available during my care  of the patient were reviewed by me and considered in my medical decision making (see chart for details).   82 year old female presents for removal of sutures that were placed in the emergency department last week.  Patient had a fall.  Had 3 sutures placed.  She has a history of peripheral edema.  There is erythema and increased edema of the left lower extremity which has been present for the last several days.  No fever.  No calf pain.  She is afebrile and overall well-appearing.  See image included in chart which shows the wound of the left lower extremity after sutures were removed.  Remove 3 sutures.  Expressed small amount of pustular material from the wound afterwards.  Bandaged area with nonadherent pad after applying bacitracin and covered with Coban.  Cellulitis of left lower extremity complicated by diabetes and obesity.  Advised to elevate extremities frequently to help with swelling.  Patient started on doxycycline as she has penicillin allergy.  Discussed wound care guidelines.  Also sent mupirocin ointment.  Advised to make a follow-up appointment with PCP as soon as possible early next week.  Advise going to ED for fever or acute worsening of the swelling, redness or pain.   Final Clinical Impressions(s) / UC Diagnoses   Final diagnoses:  Visit for wound check  Cellulitis of left lower extremity  Type 2 diabetes mellitus with other specified complication, unspecified whether long term insulin use (HCC)     Discharge Instructions      -I removed these sutures but I am concerned there is no infection.  I sent antibiotics to pharmacy.  Take full course.  Hopefully your redness and swelling began to improve over the next 2 to 3 days after beginning the antibiotic. - Clean the area with soap and water every day and apply the ointment as directed.  Change her bandage daily. - Elevate your extremities to help with swelling. - Make an appointment to follow-up with your primary care  provider early next week.  For any acute worsening of the swelling, redness, pain or fever please go to the ER.    ED Prescriptions     Medication Sig Dispense Auth. Provider   doxycycline (VIBRAMYCIN) 100 MG capsule Take 1 capsule (100 mg total) by mouth 2 (two) times daily for 7 days. 14 capsule Eusebio Friendly B, PA-C   mupirocin ointment (BACTROBAN) 2 % Apply 1 Application topically 2 (two) times daily. 22 g Shirlee Latch, PA-C      PDMP not reviewed this encounter.   Shirlee Latch, PA-C 12/29/22 1059

## 2022-12-29 NOTE — Discharge Instructions (Addendum)
-  I removed these sutures but I am concerned there is no infection.  I sent antibiotics to pharmacy.  Take full course.  Hopefully your redness and swelling began to improve over the next 2 to 3 days after beginning the antibiotic. - Clean the area with soap and water every day and apply the ointment as directed.  Change her bandage daily. - Elevate your extremities to help with swelling. - Make an appointment to follow-up with your primary care provider early next week.  For any acute worsening of the swelling, redness, pain or fever please go to the ER.

## 2023-01-02 DIAGNOSIS — G319 Degenerative disease of nervous system, unspecified: Secondary | ICD-10-CM | POA: Diagnosis not present

## 2023-01-02 DIAGNOSIS — E871 Hypo-osmolality and hyponatremia: Secondary | ICD-10-CM | POA: Diagnosis not present

## 2023-01-02 DIAGNOSIS — I10 Essential (primary) hypertension: Secondary | ICD-10-CM | POA: Diagnosis not present

## 2023-01-02 DIAGNOSIS — D649 Anemia, unspecified: Secondary | ICD-10-CM | POA: Diagnosis not present

## 2023-01-02 DIAGNOSIS — I4891 Unspecified atrial fibrillation: Secondary | ICD-10-CM | POA: Diagnosis not present

## 2023-01-02 DIAGNOSIS — E1142 Type 2 diabetes mellitus with diabetic polyneuropathy: Secondary | ICD-10-CM | POA: Diagnosis not present

## 2023-01-03 ENCOUNTER — Ambulatory Visit
Admission: RE | Admit: 2023-01-03 | Discharge: 2023-01-03 | Disposition: A | Discharge status: Home / Self Care | Payer: Medicare Other | Source: Ambulatory Visit | Attending: Internal Medicine | Admitting: Internal Medicine

## 2023-01-03 DIAGNOSIS — Z1231 Encounter for screening mammogram for malignant neoplasm of breast: Secondary | ICD-10-CM | POA: Diagnosis not present

## 2023-01-04 DIAGNOSIS — K219 Gastro-esophageal reflux disease without esophagitis: Secondary | ICD-10-CM | POA: Diagnosis not present

## 2023-01-04 DIAGNOSIS — E871 Hypo-osmolality and hyponatremia: Secondary | ICD-10-CM | POA: Diagnosis not present

## 2023-01-04 DIAGNOSIS — R531 Weakness: Secondary | ICD-10-CM | POA: Diagnosis not present

## 2023-01-04 DIAGNOSIS — H9190 Unspecified hearing loss, unspecified ear: Secondary | ICD-10-CM | POA: Diagnosis not present

## 2023-01-04 DIAGNOSIS — M1990 Unspecified osteoarthritis, unspecified site: Secondary | ICD-10-CM | POA: Diagnosis not present

## 2023-01-04 DIAGNOSIS — G319 Degenerative disease of nervous system, unspecified: Secondary | ICD-10-CM | POA: Diagnosis not present

## 2023-01-04 DIAGNOSIS — R351 Nocturia: Secondary | ICD-10-CM | POA: Diagnosis not present

## 2023-01-04 DIAGNOSIS — F32A Depression, unspecified: Secondary | ICD-10-CM | POA: Diagnosis not present

## 2023-01-04 DIAGNOSIS — I10 Essential (primary) hypertension: Secondary | ICD-10-CM | POA: Diagnosis not present

## 2023-01-04 DIAGNOSIS — E1142 Type 2 diabetes mellitus with diabetic polyneuropathy: Secondary | ICD-10-CM | POA: Diagnosis not present

## 2023-01-04 DIAGNOSIS — I4891 Unspecified atrial fibrillation: Secondary | ICD-10-CM | POA: Diagnosis not present

## 2023-01-04 DIAGNOSIS — D649 Anemia, unspecified: Secondary | ICD-10-CM | POA: Diagnosis not present

## 2023-01-08 DIAGNOSIS — D649 Anemia, unspecified: Secondary | ICD-10-CM | POA: Diagnosis not present

## 2023-01-08 DIAGNOSIS — E1142 Type 2 diabetes mellitus with diabetic polyneuropathy: Secondary | ICD-10-CM | POA: Diagnosis not present

## 2023-01-08 DIAGNOSIS — I4891 Unspecified atrial fibrillation: Secondary | ICD-10-CM | POA: Diagnosis not present

## 2023-01-08 DIAGNOSIS — I10 Essential (primary) hypertension: Secondary | ICD-10-CM | POA: Diagnosis not present

## 2023-01-08 DIAGNOSIS — E871 Hypo-osmolality and hyponatremia: Secondary | ICD-10-CM | POA: Diagnosis not present

## 2023-01-08 DIAGNOSIS — G319 Degenerative disease of nervous system, unspecified: Secondary | ICD-10-CM | POA: Diagnosis not present

## 2023-01-11 DIAGNOSIS — F32A Depression, unspecified: Secondary | ICD-10-CM | POA: Diagnosis not present

## 2023-01-11 DIAGNOSIS — H9190 Unspecified hearing loss, unspecified ear: Secondary | ICD-10-CM | POA: Diagnosis not present

## 2023-01-11 DIAGNOSIS — E871 Hypo-osmolality and hyponatremia: Secondary | ICD-10-CM | POA: Diagnosis not present

## 2023-01-11 DIAGNOSIS — I10 Essential (primary) hypertension: Secondary | ICD-10-CM | POA: Diagnosis not present

## 2023-01-11 DIAGNOSIS — Z9841 Cataract extraction status, right eye: Secondary | ICD-10-CM | POA: Diagnosis not present

## 2023-01-11 DIAGNOSIS — Z853 Personal history of malignant neoplasm of breast: Secondary | ICD-10-CM | POA: Diagnosis not present

## 2023-01-11 DIAGNOSIS — Z86711 Personal history of pulmonary embolism: Secondary | ICD-10-CM | POA: Diagnosis not present

## 2023-01-11 DIAGNOSIS — K219 Gastro-esophageal reflux disease without esophagitis: Secondary | ICD-10-CM | POA: Diagnosis not present

## 2023-01-11 DIAGNOSIS — M1990 Unspecified osteoarthritis, unspecified site: Secondary | ICD-10-CM | POA: Diagnosis not present

## 2023-01-11 DIAGNOSIS — R351 Nocturia: Secondary | ICD-10-CM | POA: Diagnosis not present

## 2023-01-11 DIAGNOSIS — E1142 Type 2 diabetes mellitus with diabetic polyneuropathy: Secondary | ICD-10-CM | POA: Diagnosis not present

## 2023-01-11 DIAGNOSIS — E7800 Pure hypercholesterolemia, unspecified: Secondary | ICD-10-CM | POA: Diagnosis not present

## 2023-01-11 DIAGNOSIS — Z9842 Cataract extraction status, left eye: Secondary | ICD-10-CM | POA: Diagnosis not present

## 2023-01-11 DIAGNOSIS — G319 Degenerative disease of nervous system, unspecified: Secondary | ICD-10-CM | POA: Diagnosis not present

## 2023-01-11 DIAGNOSIS — D649 Anemia, unspecified: Secondary | ICD-10-CM | POA: Diagnosis not present

## 2023-01-11 DIAGNOSIS — I4891 Unspecified atrial fibrillation: Secondary | ICD-10-CM | POA: Diagnosis not present

## 2023-01-11 DIAGNOSIS — R531 Weakness: Secondary | ICD-10-CM | POA: Diagnosis not present

## 2023-01-17 ENCOUNTER — Institutional Professional Consult (permissible substitution): Payer: Medicare Other | Admitting: Internal Medicine

## 2023-01-17 NOTE — Telephone Encounter (Signed)
error 

## 2023-01-22 DIAGNOSIS — D649 Anemia, unspecified: Secondary | ICD-10-CM | POA: Diagnosis not present

## 2023-01-22 DIAGNOSIS — G319 Degenerative disease of nervous system, unspecified: Secondary | ICD-10-CM | POA: Diagnosis not present

## 2023-01-22 DIAGNOSIS — I4891 Unspecified atrial fibrillation: Secondary | ICD-10-CM | POA: Diagnosis not present

## 2023-01-22 DIAGNOSIS — E871 Hypo-osmolality and hyponatremia: Secondary | ICD-10-CM | POA: Diagnosis not present

## 2023-01-22 DIAGNOSIS — E1142 Type 2 diabetes mellitus with diabetic polyneuropathy: Secondary | ICD-10-CM | POA: Diagnosis not present

## 2023-01-22 DIAGNOSIS — I10 Essential (primary) hypertension: Secondary | ICD-10-CM | POA: Diagnosis not present

## 2023-01-30 DIAGNOSIS — I4891 Unspecified atrial fibrillation: Secondary | ICD-10-CM | POA: Diagnosis not present

## 2023-01-30 DIAGNOSIS — G319 Degenerative disease of nervous system, unspecified: Secondary | ICD-10-CM | POA: Diagnosis not present

## 2023-01-30 DIAGNOSIS — E1142 Type 2 diabetes mellitus with diabetic polyneuropathy: Secondary | ICD-10-CM | POA: Diagnosis not present

## 2023-01-30 DIAGNOSIS — I10 Essential (primary) hypertension: Secondary | ICD-10-CM | POA: Diagnosis not present

## 2023-01-30 DIAGNOSIS — E871 Hypo-osmolality and hyponatremia: Secondary | ICD-10-CM | POA: Diagnosis not present

## 2023-01-30 DIAGNOSIS — D649 Anemia, unspecified: Secondary | ICD-10-CM | POA: Diagnosis not present

## 2023-01-31 DIAGNOSIS — D3132 Benign neoplasm of left choroid: Secondary | ICD-10-CM | POA: Diagnosis not present

## 2023-01-31 DIAGNOSIS — Z0100 Encounter for examination of eyes and vision without abnormal findings: Secondary | ICD-10-CM | POA: Diagnosis not present

## 2023-01-31 LAB — HM DIABETES EYE EXAM

## 2023-02-06 DIAGNOSIS — D649 Anemia, unspecified: Secondary | ICD-10-CM | POA: Diagnosis not present

## 2023-02-06 DIAGNOSIS — I4891 Unspecified atrial fibrillation: Secondary | ICD-10-CM | POA: Diagnosis not present

## 2023-02-06 DIAGNOSIS — G319 Degenerative disease of nervous system, unspecified: Secondary | ICD-10-CM | POA: Diagnosis not present

## 2023-02-06 DIAGNOSIS — I10 Essential (primary) hypertension: Secondary | ICD-10-CM | POA: Diagnosis not present

## 2023-02-06 DIAGNOSIS — E1142 Type 2 diabetes mellitus with diabetic polyneuropathy: Secondary | ICD-10-CM | POA: Diagnosis not present

## 2023-02-06 DIAGNOSIS — E871 Hypo-osmolality and hyponatremia: Secondary | ICD-10-CM | POA: Diagnosis not present

## 2023-02-09 ENCOUNTER — Ambulatory Visit (INDEPENDENT_AMBULATORY_CARE_PROVIDER_SITE_OTHER): Payer: Medicare Other | Admitting: Podiatry

## 2023-02-09 ENCOUNTER — Encounter: Payer: Self-pay | Admitting: Podiatry

## 2023-02-09 DIAGNOSIS — M79674 Pain in right toe(s): Secondary | ICD-10-CM

## 2023-02-09 DIAGNOSIS — B351 Tinea unguium: Secondary | ICD-10-CM | POA: Diagnosis not present

## 2023-02-09 DIAGNOSIS — E1142 Type 2 diabetes mellitus with diabetic polyneuropathy: Secondary | ICD-10-CM

## 2023-02-09 DIAGNOSIS — M79675 Pain in left toe(s): Secondary | ICD-10-CM

## 2023-02-10 DIAGNOSIS — I4891 Unspecified atrial fibrillation: Secondary | ICD-10-CM | POA: Diagnosis not present

## 2023-02-10 DIAGNOSIS — E7800 Pure hypercholesterolemia, unspecified: Secondary | ICD-10-CM | POA: Diagnosis not present

## 2023-02-10 DIAGNOSIS — K219 Gastro-esophageal reflux disease without esophagitis: Secondary | ICD-10-CM | POA: Diagnosis not present

## 2023-02-10 DIAGNOSIS — Z9841 Cataract extraction status, right eye: Secondary | ICD-10-CM | POA: Diagnosis not present

## 2023-02-10 DIAGNOSIS — E1142 Type 2 diabetes mellitus with diabetic polyneuropathy: Secondary | ICD-10-CM | POA: Diagnosis not present

## 2023-02-10 DIAGNOSIS — M1990 Unspecified osteoarthritis, unspecified site: Secondary | ICD-10-CM | POA: Diagnosis not present

## 2023-02-10 DIAGNOSIS — R351 Nocturia: Secondary | ICD-10-CM | POA: Diagnosis not present

## 2023-02-10 DIAGNOSIS — Z853 Personal history of malignant neoplasm of breast: Secondary | ICD-10-CM | POA: Diagnosis not present

## 2023-02-10 DIAGNOSIS — G319 Degenerative disease of nervous system, unspecified: Secondary | ICD-10-CM | POA: Diagnosis not present

## 2023-02-10 DIAGNOSIS — F0280 Dementia in other diseases classified elsewhere without behavioral disturbance: Secondary | ICD-10-CM | POA: Diagnosis not present

## 2023-02-10 DIAGNOSIS — D649 Anemia, unspecified: Secondary | ICD-10-CM | POA: Diagnosis not present

## 2023-02-10 DIAGNOSIS — E871 Hypo-osmolality and hyponatremia: Secondary | ICD-10-CM | POA: Diagnosis not present

## 2023-02-10 DIAGNOSIS — H9190 Unspecified hearing loss, unspecified ear: Secondary | ICD-10-CM | POA: Diagnosis not present

## 2023-02-10 DIAGNOSIS — I10 Essential (primary) hypertension: Secondary | ICD-10-CM | POA: Diagnosis not present

## 2023-02-10 DIAGNOSIS — R531 Weakness: Secondary | ICD-10-CM | POA: Diagnosis not present

## 2023-02-10 DIAGNOSIS — F32A Depression, unspecified: Secondary | ICD-10-CM | POA: Diagnosis not present

## 2023-02-10 DIAGNOSIS — Z86711 Personal history of pulmonary embolism: Secondary | ICD-10-CM | POA: Diagnosis not present

## 2023-02-10 DIAGNOSIS — Z9842 Cataract extraction status, left eye: Secondary | ICD-10-CM | POA: Diagnosis not present

## 2023-02-12 DIAGNOSIS — I4891 Unspecified atrial fibrillation: Secondary | ICD-10-CM | POA: Diagnosis not present

## 2023-02-12 DIAGNOSIS — E871 Hypo-osmolality and hyponatremia: Secondary | ICD-10-CM | POA: Diagnosis not present

## 2023-02-12 DIAGNOSIS — E1142 Type 2 diabetes mellitus with diabetic polyneuropathy: Secondary | ICD-10-CM | POA: Diagnosis not present

## 2023-02-12 DIAGNOSIS — D649 Anemia, unspecified: Secondary | ICD-10-CM | POA: Diagnosis not present

## 2023-02-12 DIAGNOSIS — I10 Essential (primary) hypertension: Secondary | ICD-10-CM | POA: Diagnosis not present

## 2023-02-12 DIAGNOSIS — G319 Degenerative disease of nervous system, unspecified: Secondary | ICD-10-CM | POA: Diagnosis not present

## 2023-02-13 ENCOUNTER — Other Ambulatory Visit: Payer: Self-pay | Admitting: Internal Medicine

## 2023-02-13 NOTE — Progress Notes (Signed)
  Subjective:  [name], female    DOB: 23-Mar-1940,  MRN: [id number]  82 y.o. female presents at risk foot care with history of diabetic neuropathy and painful elongated mycotic toenails 1-5 bilaterally which are tender when wearing enclosed shoe gear. Pain is relieved with periodic professional debridement.  New problem(s): None   PCP is Dale [place], MD , and last visit was December 22, 2022.  Allergies  Allergen Reactions   Penicillins Other (See Comments)    Unknown- pt states been a long time ago  Has patient had a PCN reaction causing immediate rash, facial/tongue/throat swelling, SOB or lightheadedness with hypotension: Unknown Has patient had a PCN reaction causing severe rash involving mucus membranes or skin necrosis: Unknown Has patient had a PCN reaction that required hospitalization: Unknown Has patient had a PCN reaction occurring within the last 10 years: Unknown If all of the above answers are "NO", then may proceed with Cephalosporin use.   Penicillin V Potassium Nausea And Vomiting    Review of Systems: Negative except as noted in the HPI.   Objective:  Anita Walker is a pleasant 82 y.o. female morbidly obese in NAD. AAO x 3.  Vascular Examination: CFT <3 seconds b/l LE. Palpable DP/PT pulses b/l LE. Digital hair absent b/l. Skin temperature gradient WNL b/l. No pain with calf compression b/l. No edema noted b/l. No cyanosis or clubbing noted b/l LE. Mild varicosities b/l LE.  Neurological Examination: Sensation grossly intact b/l with 10 gram monofilament. Vibratory sensation intact b/l.  Dermatological Examination: Pedal skin with normal turgor, texture and tone b/l. No open wounds nor interdigital macerations noted. Toenails 1-5 b/l thick, discolored, elongated with subungual debris and pain on dorsal palpation. No hyperkeratotic lesions noted b/l.   Musculoskeletal Examination: Muscle strength 5/5 to b/l LE.  No pain, crepitus noted  b/l. No gross pedal deformities. Patient ambulates independently without assistive aids.   Radiographs: None  Last A1c:      Latest Ref Rng & Units 11/01/2022   10:32 AM 06/12/2022   10:33 AM 03/01/2022   10:01 AM  Hemoglobin A1C  Hemoglobin-A1c 4.6 - 6.5 % 5.9  5.9  5.7      Assessment:   1. Pain due to onychomycosis of toenails of both feet   2. Diabetic polyneuropathy associated with type 2 diabetes mellitus (HCC)    Plan:  -Consent given for treatment as described below: -Examined patient. -Continue foot and shoe inspections daily. Monitor blood glucose per PCP/Endocrinologist's recommendations. -Patient to continue soft, supportive shoe gear daily. -Toenails 1-5 b/l were debrided in length and girth with sterile nail nippers and dremel without iatrogenic bleeding.  -Patient/POA to call should there be question/concern in the interim.  Return in about 3 months (around 05/10/2023).  Freddie Breech, DPM      [place] LOCATION: [address] [place] [address]                   Office [phone]   [facility] LOCATION: [address] [place], [place] [address] Office [phone]

## 2023-02-16 ENCOUNTER — Ambulatory Visit: Payer: Medicare Other | Admitting: Internal Medicine

## 2023-02-16 ENCOUNTER — Encounter: Payer: Self-pay | Admitting: Internal Medicine

## 2023-02-16 VITALS — BP 136/86 | HR 78 | Temp 97.6°F | Ht 69.0 in | Wt 252.8 lb

## 2023-02-16 DIAGNOSIS — R0683 Snoring: Secondary | ICD-10-CM

## 2023-02-16 DIAGNOSIS — Z6837 Body mass index (BMI) 37.0-37.9, adult: Secondary | ICD-10-CM

## 2023-02-16 DIAGNOSIS — E669 Obesity, unspecified: Secondary | ICD-10-CM | POA: Diagnosis not present

## 2023-02-16 DIAGNOSIS — G4733 Obstructive sleep apnea (adult) (pediatric): Secondary | ICD-10-CM

## 2023-02-16 DIAGNOSIS — G4719 Other hypersomnia: Secondary | ICD-10-CM

## 2023-02-16 NOTE — Progress Notes (Signed)
Name: Anita Walker MRN: [id number] DOB: [birth year]    CHIEF COMPLAINT:  EXCESSIVE DAYTIME SLEEPINESS Assessment of OSA   HISTORY OF PRESENT ILLNESS: Patient is accompanied by her caretaker Patient is very hard of hearing Patient is seen today for problems and issues with sleep related to excessive daytime sleepiness Patient  has been having sleep problems for many years Patient has been having excessive daytime sleepiness for a long time Patient has been having extreme fatigue and tiredness, lack of energy +  snoring every night + Nonrefreshing sleep  Discussed sleep data and reviewed with patient.  Encouraged proper weight management.  Discussed driving precautions and its relationship with hypersomnolence.  Discussed operating dangerous equipment and its relationship with hypersomnolence.  Discussed sleep hygiene, and benefits of a fixed sleep waked time.  The importance of getting eight or more hours of sleep discussed with patient.  Discussed limiting the use of the computer and television before bedtime.  Decrease naps during the day, so night time sleep will become enhanced.  Limit caffeine, and sleep deprivation.  HTN, stroke, and heart failure are potential risk factors.    EPWORTH SLEEP SCORE 9  No exacerbation at this time No evidence of heart failure at this time No evidence or signs of infection at this time No respiratory distress No fevers, chills, nausea, vomiting, diarrhea No evidence of lower extremity edema No evidence hemoptysis   PAST MEDICAL HISTORY :   has a past medical history of Anemia, Arthritis, Atrial fibrillation (HCC), Breast cancer (HCC), Diabetes mellitus (HCC), Edema, Gastric ulcer, GERD (gastroesophageal reflux disease), HOH (hard of hearing), Hypercholesterolemia, Hypertension, Personal history of chemotherapy, and Pulmonary emboli (HCC).  has a past surgical history that includes Cataract extraction w/PHACO (Right, 12/26/2016);  Cataract extraction w/PHACO (Left, 01/23/2017); Breast lumpectomy (03/06/1990); and Colonoscopy with propofol (N/A, 07/08/2019). Prior to Admission medications   Medication Sig Start Date End Date Taking? Authorizing Provider  albuterol (VENTOLIN HFA) 108 (90 Base) MCG/ACT inhaler Inhale 1-2 puffs into the lungs every 4 (four) hours as needed for wheezing. 01/09/23  Yes [provider]  amLODipine (NORVASC) 2.5 MG tablet Take 1 tablet (2.5 mg total) by mouth daily. 06/21/22  Yes Dale [place], MD  busPIRone (BUSPAR) 5 MG tablet TAKE 1 TABLET(5 MG) BY MOUTH DAILY 02/14/23  Yes Dale [place], MD  diphenhydrAMINE (BENADRYL) 25 mg capsule Take 25 mg at bedtime as needed by mouth for sleep.    Yes [provider]  feeding supplement (ENSURE ENLIVE / ENSURE PLUS) LIQD Take 237 mLs by mouth 2 (two) times daily between meals. 04/19/20  Yes Enedina Finner, MD  ferrous sulfate (FEROSUL) 325 (65 FE) MG tablet Take 1 tablet (325 mg total) by mouth every other day. One tablet qod 06/21/22  Yes Scott, Westley Hummer, MD  glucose blood (CONTOUR TEST) test strip USE TO TEST BLOOD SUGAR ONCE DAILY 04/20/22  Yes Dale [place], MD  losartan (COZAAR) 100 MG tablet TAKE 1 TABLET(100 MG) BY MOUTH DAILY 03/13/22  Yes Dale [place], MD  Multiple Vitamin (MULTIVITAMIN WITH MINERALS) TABS tablet Take 1 tablet daily by mouth.   Yes [provider]  mupirocin ointment (BACTROBAN) 2 % Apply 1 Application topically 2 (two) times daily. 12/29/22  Yes Eusebio Friendly B, PA-C  pantoprazole (PROTONIX) 40 MG tablet TAKE 1 TABLET(40 MG) BY MOUTH DAILY 08/16/22  Yes Dale [place], MD  simvastatin (ZOCOR) 10 MG tablet Take 1 tablet (10 mg total) by mouth daily. 06/21/22  Yes Dale , MD  Allergies  Allergen Reactions   Penicillins Other (See Comments)    Unknown- pt states been a long time ago  Has patient had a PCN reaction causing immediate rash, facial/tongue/throat swelling, SOB or lightheadedness with  hypotension: Unknown Has patient had a PCN reaction causing severe rash involving mucus membranes or skin necrosis: Unknown Has patient had a PCN reaction that required hospitalization: Unknown Has patient had a PCN reaction occurring within the last 10 years: Unknown If all of the above answers are "NO", then may proceed with Cephalosporin use.   Penicillin V Potassium Nausea And Vomiting    FAMILY HISTORY:  family history includes Breast cancer in her daughter; Heart disease in her brother and father. SOCIAL HISTORY:  reports that she has never smoked. She has never used smokeless tobacco. She reports that she does not drink alcohol and does not use drugs.   Review of Systems:  Gen:  Denies  fever, sweats, chills weight loss  HEENT: Denies blurred vision, double vision, ear pain, eye pain, hearing loss, nose bleeds, sore throat Cardiac:  No dizziness, chest pain or heaviness, chest tightness,edema, No JVD Resp:   No cough, -sputum production, -shortness of breath,-wheezing, -hemoptysis,  Gi: Denies swallowing difficulty, stomach pain, nausea or vomiting, diarrhea, constipation, bowel incontinence Gu:  Denies bladder incontinence, burning urine Ext:   Denies Joint pain, stiffness or swelling Skin: Denies  skin rash, easy bruising or bleeding or hives Endoc:  Denies polyuria, polydipsia , polyphagia or weight change Psych:   Denies depression, insomnia or hallucinations  Other:  All other systems negative   ALL OTHER ROS ARE NEGATIVE   BP 136/86 (BP Location: Left Arm, Patient Position: Sitting, Cuff Size: Normal)   Pulse 78   Temp 97.6 F (36.4 C) (Temporal)   Ht 5\' 9"  (1.753 m)   Wt 252 lb 12.8 oz (114.7 kg)   SpO2 96%   BMI 37.33 kg/m     Physical Examination:   General Appearance: No distress  EYES PERRLA, EOM intact.   NECK Supple, No JVD Pulmonary: normal breath sounds, No wheezing.  CardiovascularNormal S1,S2.  No m/r/g.   Abdomen: Benign, Soft,  non-tender. Skin:   warm, no rashes, no ecchymosis  Extremities: normal, no cyanosis, clubbing. Neuro:without focal findings,  speech normal  PSYCHIATRIC: Mood, affect within normal limits.   ALL OTHER ROS ARE NEGATIVE    ASSESSMENT AND PLAN SYNOPSIS  Patient with signs and symptoms of excessive daytime sleepiness with probable underlying diagnosis of obstructive sleep apnea in the setting of obesity and deconditioned state   Recommend Sleep Study for definitve diagnosis  Obesity -recommend significant weight loss -recommend changing diet  Deconditioned state Patient with underlying dementia  MEDICATION ADJUSTMENTS/LABS AND TESTS ORDERED: Recommend Sleep Study    CURRENT MEDICATIONS REVIEWED AT LENGTH WITH PATIENT TODAY   Patient  satisfied with Plan of action and management. All questions answered  Follow up  3 months  Total Time Spent  50 mins   Wallis Bamberg Santiago Glad, M.D.  [facility]  Medical Director [facility] Medical Director [facility] Cardio-Pulmonary Department

## 2023-02-16 NOTE — Patient Instructions (Signed)
Recommend home sleep study to assess for sleep apnea  Avoid Allergens and Irritants Avoid secondhand smoke Avoid SICK contacts Recommend  Masking  when appropriate Recommend Keep up-to-date with vaccinations

## 2023-02-17 ENCOUNTER — Encounter: Payer: Self-pay | Admitting: Internal Medicine

## 2023-02-17 DIAGNOSIS — R400 Somnolence: Secondary | ICD-10-CM | POA: Insufficient documentation

## 2023-02-20 DIAGNOSIS — D649 Anemia, unspecified: Secondary | ICD-10-CM | POA: Diagnosis not present

## 2023-02-20 DIAGNOSIS — I10 Essential (primary) hypertension: Secondary | ICD-10-CM | POA: Diagnosis not present

## 2023-02-20 DIAGNOSIS — E871 Hypo-osmolality and hyponatremia: Secondary | ICD-10-CM | POA: Diagnosis not present

## 2023-02-20 DIAGNOSIS — I4891 Unspecified atrial fibrillation: Secondary | ICD-10-CM | POA: Diagnosis not present

## 2023-02-20 DIAGNOSIS — E1142 Type 2 diabetes mellitus with diabetic polyneuropathy: Secondary | ICD-10-CM | POA: Diagnosis not present

## 2023-02-20 DIAGNOSIS — G319 Degenerative disease of nervous system, unspecified: Secondary | ICD-10-CM | POA: Diagnosis not present

## 2023-02-26 DIAGNOSIS — I4891 Unspecified atrial fibrillation: Secondary | ICD-10-CM | POA: Diagnosis not present

## 2023-02-26 DIAGNOSIS — E1142 Type 2 diabetes mellitus with diabetic polyneuropathy: Secondary | ICD-10-CM | POA: Diagnosis not present

## 2023-02-26 DIAGNOSIS — E871 Hypo-osmolality and hyponatremia: Secondary | ICD-10-CM | POA: Diagnosis not present

## 2023-02-26 DIAGNOSIS — G319 Degenerative disease of nervous system, unspecified: Secondary | ICD-10-CM | POA: Diagnosis not present

## 2023-02-26 DIAGNOSIS — I10 Essential (primary) hypertension: Secondary | ICD-10-CM | POA: Diagnosis not present

## 2023-02-26 DIAGNOSIS — D649 Anemia, unspecified: Secondary | ICD-10-CM | POA: Diagnosis not present

## 2023-03-06 ENCOUNTER — Encounter: Payer: Self-pay | Admitting: Internal Medicine

## 2023-03-06 DIAGNOSIS — G319 Degenerative disease of nervous system, unspecified: Secondary | ICD-10-CM | POA: Diagnosis not present

## 2023-03-06 DIAGNOSIS — E871 Hypo-osmolality and hyponatremia: Secondary | ICD-10-CM | POA: Diagnosis not present

## 2023-03-06 DIAGNOSIS — D649 Anemia, unspecified: Secondary | ICD-10-CM | POA: Diagnosis not present

## 2023-03-06 DIAGNOSIS — E1142 Type 2 diabetes mellitus with diabetic polyneuropathy: Secondary | ICD-10-CM | POA: Diagnosis not present

## 2023-03-06 DIAGNOSIS — I10 Essential (primary) hypertension: Secondary | ICD-10-CM | POA: Diagnosis not present

## 2023-03-06 DIAGNOSIS — I4891 Unspecified atrial fibrillation: Secondary | ICD-10-CM | POA: Diagnosis not present

## 2023-03-07 MED ORDER — ALBUTEROL SULFATE HFA 108 (90 BASE) MCG/ACT IN AERS: 1.0000 | INHALATION_SPRAY | Freq: Four times a day (QID) | RESPIRATORY_TRACT | 1 refills | Status: DC | PRN

## 2023-03-07 NOTE — Telephone Encounter (Signed)
 I sent in rx for her inhaler. Please call and confirm she is doing ok.

## 2023-03-08 NOTE — Telephone Encounter (Signed)
 FYI-   Called patients daughter. Confirmed doing ok. She needs inhaler to have on hand. While on the phone, daughter noted that her right ankle has been more swollen than the left. She was not sure if it is red, warm to touch, etc. Pt is not complaining of pain. No increased SOB. Daughter is going to look at her leg when she gets home. If something acute, will call to see if she can be seen sooner or take her to acute care. Patient has upcoming appt in the next week or so. Daughter is going to update me.

## 2023-03-08 NOTE — Telephone Encounter (Signed)
 Noted,

## 2023-03-08 NOTE — Telephone Encounter (Signed)
 Noted.  Agree with plan.  Let me know if I need to do anything more.

## 2023-03-12 DIAGNOSIS — I4891 Unspecified atrial fibrillation: Secondary | ICD-10-CM | POA: Diagnosis not present

## 2023-03-12 DIAGNOSIS — F0280 Dementia in other diseases classified elsewhere without behavioral disturbance: Secondary | ICD-10-CM | POA: Diagnosis not present

## 2023-03-12 DIAGNOSIS — Z853 Personal history of malignant neoplasm of breast: Secondary | ICD-10-CM | POA: Diagnosis not present

## 2023-03-12 DIAGNOSIS — F32A Depression, unspecified: Secondary | ICD-10-CM | POA: Diagnosis not present

## 2023-03-12 DIAGNOSIS — R531 Weakness: Secondary | ICD-10-CM | POA: Diagnosis not present

## 2023-03-12 DIAGNOSIS — Z9842 Cataract extraction status, left eye: Secondary | ICD-10-CM | POA: Diagnosis not present

## 2023-03-12 DIAGNOSIS — D649 Anemia, unspecified: Secondary | ICD-10-CM | POA: Diagnosis not present

## 2023-03-12 DIAGNOSIS — Z9841 Cataract extraction status, right eye: Secondary | ICD-10-CM | POA: Diagnosis not present

## 2023-03-12 DIAGNOSIS — G319 Degenerative disease of nervous system, unspecified: Secondary | ICD-10-CM | POA: Diagnosis not present

## 2023-03-12 DIAGNOSIS — K219 Gastro-esophageal reflux disease without esophagitis: Secondary | ICD-10-CM | POA: Diagnosis not present

## 2023-03-12 DIAGNOSIS — E1142 Type 2 diabetes mellitus with diabetic polyneuropathy: Secondary | ICD-10-CM | POA: Diagnosis not present

## 2023-03-12 DIAGNOSIS — M1990 Unspecified osteoarthritis, unspecified site: Secondary | ICD-10-CM | POA: Diagnosis not present

## 2023-03-12 DIAGNOSIS — H9190 Unspecified hearing loss, unspecified ear: Secondary | ICD-10-CM | POA: Diagnosis not present

## 2023-03-12 DIAGNOSIS — Z86711 Personal history of pulmonary embolism: Secondary | ICD-10-CM | POA: Diagnosis not present

## 2023-03-12 DIAGNOSIS — E7800 Pure hypercholesterolemia, unspecified: Secondary | ICD-10-CM | POA: Diagnosis not present

## 2023-03-12 DIAGNOSIS — E871 Hypo-osmolality and hyponatremia: Secondary | ICD-10-CM | POA: Diagnosis not present

## 2023-03-12 DIAGNOSIS — I10 Essential (primary) hypertension: Secondary | ICD-10-CM | POA: Diagnosis not present

## 2023-03-12 DIAGNOSIS — R351 Nocturia: Secondary | ICD-10-CM | POA: Diagnosis not present

## 2023-03-14 ENCOUNTER — Other Ambulatory Visit (INDEPENDENT_AMBULATORY_CARE_PROVIDER_SITE_OTHER): Payer: Medicare Other

## 2023-03-14 DIAGNOSIS — E871 Hypo-osmolality and hyponatremia: Secondary | ICD-10-CM | POA: Diagnosis not present

## 2023-03-14 LAB — SODIUM: Sodium: 129 meq/L — ABNORMAL LOW (ref 135–145)

## 2023-03-19 ENCOUNTER — Ambulatory Visit (INDEPENDENT_AMBULATORY_CARE_PROVIDER_SITE_OTHER): Payer: Medicare Other

## 2023-03-19 ENCOUNTER — Encounter: Payer: Self-pay | Admitting: Internal Medicine

## 2023-03-19 ENCOUNTER — Ambulatory Visit (INDEPENDENT_AMBULATORY_CARE_PROVIDER_SITE_OTHER): Payer: Medicare Other | Admitting: Internal Medicine

## 2023-03-19 VITALS — BP 114/70 | HR 75 | Temp 97.9°F | Resp 16 | Ht 69.0 in | Wt 252.0 lb

## 2023-03-19 DIAGNOSIS — E871 Hypo-osmolality and hyponatremia: Secondary | ICD-10-CM

## 2023-03-19 DIAGNOSIS — E7800 Pure hypercholesterolemia, unspecified: Secondary | ICD-10-CM | POA: Diagnosis not present

## 2023-03-19 DIAGNOSIS — E1149 Type 2 diabetes mellitus with other diabetic neurological complication: Secondary | ICD-10-CM | POA: Diagnosis not present

## 2023-03-19 DIAGNOSIS — M7989 Other specified soft tissue disorders: Secondary | ICD-10-CM | POA: Diagnosis not present

## 2023-03-19 DIAGNOSIS — G319 Degenerative disease of nervous system, unspecified: Secondary | ICD-10-CM | POA: Diagnosis not present

## 2023-03-19 DIAGNOSIS — I10 Essential (primary) hypertension: Secondary | ICD-10-CM

## 2023-03-19 DIAGNOSIS — K449 Diaphragmatic hernia without obstruction or gangrene: Secondary | ICD-10-CM | POA: Diagnosis not present

## 2023-03-19 DIAGNOSIS — R600 Localized edema: Secondary | ICD-10-CM

## 2023-03-19 DIAGNOSIS — F419 Anxiety disorder, unspecified: Secondary | ICD-10-CM

## 2023-03-19 DIAGNOSIS — E1142 Type 2 diabetes mellitus with diabetic polyneuropathy: Secondary | ICD-10-CM

## 2023-03-19 LAB — HEPATIC FUNCTION PANEL
ALT: 19 U/L (ref 0–35)
AST: 19 U/L (ref 0–37)
Albumin: 4.2 g/dL (ref 3.5–5.2)
Alkaline Phosphatase: 71 U/L (ref 39–117)
Bilirubin, Direct: 0.1 mg/dL (ref 0.0–0.3)
Total Bilirubin: 0.4 mg/dL (ref 0.2–1.2)
Total Protein: 6.6 g/dL (ref 6.0–8.3)

## 2023-03-19 LAB — MICROALBUMIN / CREATININE URINE RATIO
Creatinine,U: 55.3 mg/dL
Microalb Creat Ratio: 2.9 mg/g (ref 0.0–30.0)
Microalb, Ur: 1.6 mg/dL (ref 0.0–1.9)

## 2023-03-19 LAB — TSH: TSH: 5.83 u[IU]/mL — ABNORMAL HIGH (ref 0.35–5.50)

## 2023-03-19 LAB — BASIC METABOLIC PANEL
BUN: 15 mg/dL (ref 6–23)
CO2: 29 meq/L (ref 19–32)
Calcium: 9.5 mg/dL (ref 8.4–10.5)
Chloride: 93 meq/L — ABNORMAL LOW (ref 96–112)
Creatinine, Ser: 0.57 mg/dL (ref 0.40–1.20)
GFR: 83.12 mL/min (ref >60.00)
Glucose, Bld: 108 mg/dL — ABNORMAL HIGH (ref 70–99)
Potassium: 4.9 meq/L (ref 3.5–5.1)
Sodium: 129 meq/L — ABNORMAL LOW (ref 135–145)

## 2023-03-19 LAB — LIPID PANEL
Cholesterol: 155 mg/dL (ref 0–200)
HDL: 89.7 mg/dL (ref >39.00)
LDL Cholesterol: 53 mg/dL (ref 0–99)
NonHDL: 65.35
Total CHOL/HDL Ratio: 2
Triglycerides: 62 mg/dL (ref 0.0–149.0)
VLDL: 12.4 mg/dL (ref 0.0–40.0)

## 2023-03-19 LAB — HEMOGLOBIN A1C: Hgb A1c MFr Bld: 6 % (ref 4.6–6.5)

## 2023-03-19 MED ORDER — CONTOUR TEST VI STRP: ORAL_STRIP | 11 refills | Status: AC

## 2023-03-19 NOTE — Progress Notes (Signed)
 Subjective:    [name], female    DOB: 01-20-1941, 83 y.o.   MRN: [id number]  Patient here for  Chief Complaint  Patient presents with   Medical Management of Chronic Issues    HPI Here for a physical exam. Was scheduled for a physical, but given increased concerns - appt changed to f/u appt. She is accompanied by her caretaker. History obtained from both of them. Saw pulmonary 02/16/23 - evaluation for possible sleep apnea. Recommended sleep study. She feels she is doing relatively well. Feels her breathing is stable. No increased cough or congestion. Eating. No vomiting or diarrhea reported. No abdominal pain.  Increased lower extremity swelling. She does weigh herself. States her weight is stable 246-248 lbs. Discussed labs - sodium 129. Discussed fluid restriction. Caretaker reports some increased agitation.    Past Medical History:  Diagnosis Date   Anemia    Arthritis    Atrial fibrillation (HCC)    Breast cancer (HCC)    s/p lumpectomy 1992.  s/p chemo and xrt left breast   Diabetes mellitus (HCC)    Edema    feet/legs   Gastric ulcer    GERD (gastroesophageal reflux disease)    HOH (hard of hearing)    aides   Hypercholesterolemia    Hypertension    Personal history of chemotherapy    Pulmonary emboli [facility])    Past Surgical History:  Procedure Laterality Date   BREAST LUMPECTOMY  03/06/1990   left breast   CATARACT EXTRACTION W/PHACO Right 12/26/2016   Procedure: CATARACT EXTRACTION PHACO AND INTRAOCULAR LENS PLACEMENT (IOC)-RIGHT DIABETIC;  Surgeon: Jaye Fallow, MD;  Location: [facility] ORS;  Service: Ophthalmology;  Laterality: Right;  US  00:47 AP% 24.5 CDE 11.62 Fluid pack lot # [id number]   CATARACT EXTRACTION W/PHACO Left 01/23/2017   Procedure: CATARACT EXTRACTION PHACO AND INTRAOCULAR LENS PLACEMENT (IOC);  Surgeon: Jaye Fallow, MD;  Location: [facility] ORS;  Service: Ophthalmology;  Laterality: Left;  US  00:50 AP% 16.1 CDE 8.18 Fluid  pack lot #[id number]   COLONOSCOPY WITH PROPOFOL  N/A 07/08/2019   Procedure: COLONOSCOPY WITH PROPOFOL ;  Surgeon: Jinny Carmine, MD;  Location: [facility] ENDOSCOPY;  Service: Endoscopy;  Laterality: N/A;   Family History  Problem Relation Age of Onset   Heart disease Father    Heart disease Brother        s/p CABG   Breast cancer Daughter    Colon cancer Neg Hx    Social History   Socioeconomic History   Marital status: Widowed    Spouse name: Not on file   Number of children: Not on file   Years of education: Not on file   Highest education level: 10th grade  Occupational History   Not on file  Tobacco Use   Smoking status: Never   Smokeless tobacco: Never  Vaping Use   Vaping status: Never Used  Substance and Sexual Activity   Alcohol use: No    Alcohol/week: 0.0 standard drinks of alcohol   Drug use: No   Sexual activity: Never  Other Topics Concern   Not on file  Social History Narrative   Not on file   Social Drivers of Health   Financial Resource Strain: Low Risk  (12/22/2022)   Overall Financial Resource Strain (CARDIA)    Difficulty of Paying Living Expenses: Not hard at all  Food Insecurity: No Food Insecurity (12/22/2022)   Hunger Vital Sign    Worried About Running Out of Food in the  Last Year: Never true    Ran Out of Food in the Last Year: Never true  Transportation Needs: No Transportation Needs (12/22/2022)   PRAPARE - Administrator, Civil Service (Medical): No    Lack of Transportation (Non-Medical): No  Physical Activity: Inactive (12/22/2022)   Exercise Vital Sign    Days of Exercise per Week: 0 days    Minutes of Exercise per Session: 20 min  Stress: No Stress Concern Present (12/22/2022)   [employer] of Occupational Health - Occupational Stress Questionnaire    Feeling of Stress : Only a little  Social Connections: Socially Isolated (12/22/2022)   Social Connection and Isolation Panel [NHANES]    Frequency of Communication with  Friends and Family: Never    Frequency of Social Gatherings with Friends and Family: Never    Attends Religious Services: Never    Database Administrator or Organizations: No    Attends Engineer, Structural: Not on file    Marital Status: Widowed     Review of Systems  Constitutional:  Negative for appetite change and unexpected weight change.  HENT:  Negative for congestion and sinus pressure.   Respiratory:  Negative for cough and chest tightness.        Feels breathing is stable.   Cardiovascular:  Negative for chest pain and palpitations.  Gastrointestinal:  Negative for abdominal pain, diarrhea, nausea and vomiting.  Genitourinary:  Negative for difficulty urinating and dysuria.  Musculoskeletal:  Negative for joint swelling and myalgias.  Skin:  Negative for color change and rash.  Neurological:  Negative for dizziness and headaches.  Psychiatric/Behavioral:  Negative for agitation and dysphoric mood.        Objective:     BP 114/70   Pulse 75   Temp 97.9 F (36.6 C)   Resp 16   Ht 5' 9 (1.753 m)   Wt 252 lb (114.3 kg)   SpO2 98%   BMI 37.21 kg/m  Wt Readings from Last 3 Encounters:  03/24/23 251 lb 15.8 oz (114.3 kg)  03/19/23 252 lb (114.3 kg)  02/16/23 252 lb 12.8 oz (114.7 kg)    Physical Exam Vitals reviewed.  Constitutional:      General: She is not in acute distress.    Appearance: Normal appearance.  HENT:     Head: Normocephalic and atraumatic.     Right Ear: External ear normal.     Left Ear: External ear normal.     Mouth/Throat:     Pharynx: No oropharyngeal exudate or posterior oropharyngeal erythema.  Eyes:     General: No scleral icterus.       Right eye: No discharge.        Left eye: No discharge.     Conjunctiva/sclera: Conjunctivae normal.  Neck:     Thyroid : No thyromegaly.  Cardiovascular:     Rate and Rhythm: Normal rate and regular rhythm.  Pulmonary:     Effort: No respiratory distress.     Breath sounds: Normal  breath sounds. No wheezing.  Abdominal:     General: Bowel sounds are normal.     Palpations: Abdomen is soft.     Tenderness: There is no abdominal tenderness.  Musculoskeletal:     Cervical back: Neck supple. No tenderness.     Comments: Increased lower extremity swelling - bilateral.  Stasis changes.  Lymphadenopathy:     Cervical: No cervical adenopathy.  Skin:    Findings: No erythema or rash.  Neurological:     Mental Status: She is alert.  Psychiatric:        Mood and Affect: Mood normal.        Behavior: Behavior normal.      No facility-administered encounter medications on file as of 03/19/2023.   Outpatient Encounter Medications as of 03/19/2023  Medication Sig   albuterol  (VENTOLIN  HFA) 108 (90 Base) MCG/ACT inhaler Inhale 1-2 puffs into the lungs every 6 (six) hours as needed for wheezing.   amLODipine  (NORVASC ) 2.5 MG tablet Take 1 tablet (2.5 mg total) by mouth daily.   busPIRone  (BUSPAR ) 5 MG tablet TAKE 1 TABLET(5 MG) BY MOUTH DAILY   diphenhydrAMINE  (BENADRYL ) 25 mg capsule Take 25 mg at bedtime as needed by mouth for sleep.    feeding supplement (ENSURE ENLIVE / ENSURE PLUS) LIQD Take 237 mLs by mouth 2 (two) times daily between meals.   ferrous sulfate  (FEROSUL) 325 (65 FE) MG tablet Take 1 tablet (325 mg total) by mouth every other day. One tablet qod   glucose blood (CONTOUR TEST) test strip USE TO TEST BLOOD SUGAR ONCE DAILY   losartan  (COZAAR ) 100 MG tablet TAKE 1 TABLET(100 MG) BY MOUTH DAILY   Multiple Vitamin (MULTIVITAMIN WITH MINERALS) TABS tablet Take 1 tablet daily by mouth.   mupirocin  ointment (BACTROBAN ) 2 % Apply 1 Application topically 2 (two) times daily.   pantoprazole  (PROTONIX ) 40 MG tablet TAKE 1 TABLET(40 MG) BY MOUTH DAILY   simvastatin  (ZOCOR ) 10 MG tablet Take 1 tablet (10 mg total) by mouth daily.   [DISCONTINUED] glucose blood (CONTOUR TEST) test strip USE TO TEST BLOOD SUGAR ONCE DAILY     Lab Results  Component Value Date   WBC  3.5 (L) 03/24/2023   HGB 14.0 03/24/2023   HCT 40.9 03/24/2023   PLT 178 03/24/2023   GLUCOSE 137 (H) 03/25/2023   CHOL 155 03/19/2023   TRIG 62.0 03/19/2023   HDL 89.70 03/19/2023   LDLCALC 53 03/19/2023   ALT 19 03/19/2023   AST 19 03/19/2023   NA 129 (L) 03/25/2023   K 4.6 03/25/2023   CL 95 (L) 03/25/2023   CREATININE 0.68 03/25/2023   BUN 21 03/25/2023   CO2 23 03/25/2023   TSH 5.83 (H) 03/19/2023   INR 1.2 (H) 07/22/2020   HGBA1C 6.0 03/19/2023   MICROALBUR 1.6 03/19/2023    MM 3D SCREENING MAMMOGRAM BILATERAL BREAST Result Date: 01/08/2023 CLINICAL DATA:  Screening. EXAM: DIGITAL SCREENING BILATERAL MAMMOGRAM WITH TOMOSYNTHESIS AND CAD TECHNIQUE: Bilateral screening digital craniocaudal and mediolateral oblique mammograms were obtained. Bilateral screening digital breast tomosynthesis was performed. The images were evaluated with computer-aided detection. COMPARISON:  Previous exam(s). ACR Breast Density Category b: There are scattered areas of fibroglandular density. FINDINGS: There are no findings suspicious for malignancy. IMPRESSION: No mammographic evidence of malignancy. A result letter of this screening mammogram will be mailed directly to the patient. RECOMMENDATION: Screening mammogram in one year. (Code:SM-B-01Y) BI-RADS CATEGORY  1: Negative. Electronically Signed   By: Alm Parkins M.D.   On: 01/08/2023 12:44       Assessment & Plan:  Type 2 diabetes mellitus with neurological complications (HCC) Assessment & Plan: Low carb diet and exercise as tolerated.  Follow met b and a1c.   Orders: -     Basic metabolic panel -     Hemoglobin A1c -     Microalbumin / creatinine urine ratio -     Contour Test; USE TO TEST BLOOD SUGAR ONCE DAILY  Dispense: 50 each; Refill: 11 -     DG Chest 2 View; Future  Hyponatremia Assessment & Plan: Discussed recent low sodium.  Discussed importance of eating regular meals.  Discussed fluis restriction. Also, concern regarding  increased edema.  Recheck sodium today.  Also check urine sodium, urine osm/serum osm. Consider lasix  - for a few days to see if helps. Follow.  Orders: -     Basic metabolic panel -     TSH -     Osmolality, urine -     Osmolality -     Sodium, urine, random -     Ambulatory referral to Nephrology  Primary hypertension Assessment & Plan: Continue losartan  and amlodipine .  Follow pressures. Follow metabolic panel.   Lab Results  Component Value Date   CREATININE 0.68 03/25/2023      Hypercholesteremia Assessment & Plan: On simvastatin .  Low cholesterol diet and exercise.  Follow lipid panel and liver function tests.    Orders: -     Hepatic function panel -     TSH -     Lipid panel  Lower extremity edema -     DG Chest 2 View; Future  Essential hypertension Assessment & Plan: Continue losartan  and amlodipine .  Blood pressure as outlined. .  Follow pressures.  Follow metabolic panel.    Diabetic polyneuropathy associated with type 2 diabetes mellitus (HCC) Assessment & Plan: Sugars have been well controlled.  Continue low carb diet and exercise.  Not moving around as much.  PT has started working on strengthening and gait. Follow met b and a1c.  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) Assessment & Plan: Found on previous CT head.  Mild.  Stable.    Anxiety Assessment & Plan: Has had issues with increased anxiety. Some increased irritability at times. On buspar . Hold adjusting medications today. Recheck sodium. See if can improved sodium level. See if symptoms improve.     Localized swelling of both lower extremities Assessment & Plan: Lower extremity swelling - bilateral. Persistent. Consider trial of lasix  as outlined. (Short course). Leg elevation when sitting. Discussed compression hose. Unable to get on daily. Discussed referral to [facility] for evaluation. Discussed possible wrapping, etc. Check echo.    Orders: -     Ambulatory referral to Vascular Surgery     Allena Hamilton, MD

## 2023-03-20 DIAGNOSIS — D649 Anemia, unspecified: Secondary | ICD-10-CM | POA: Diagnosis not present

## 2023-03-20 DIAGNOSIS — E1142 Type 2 diabetes mellitus with diabetic polyneuropathy: Secondary | ICD-10-CM | POA: Diagnosis not present

## 2023-03-20 DIAGNOSIS — G319 Degenerative disease of nervous system, unspecified: Secondary | ICD-10-CM | POA: Diagnosis not present

## 2023-03-20 DIAGNOSIS — E871 Hypo-osmolality and hyponatremia: Secondary | ICD-10-CM | POA: Diagnosis not present

## 2023-03-20 DIAGNOSIS — I4891 Unspecified atrial fibrillation: Secondary | ICD-10-CM | POA: Diagnosis not present

## 2023-03-20 DIAGNOSIS — I10 Essential (primary) hypertension: Secondary | ICD-10-CM | POA: Diagnosis not present

## 2023-03-21 LAB — SODIUM, URINE, RANDOM: Sodium, Ur: 64 mmol/L (ref 28–272)

## 2023-03-21 LAB — OSMOLALITY: Osmolality: 270 mosm/kg — ABNORMAL LOW (ref 278–305)

## 2023-03-21 LAB — OSMOLALITY, URINE: Osmolality, Ur: 430 mosm/kg (ref 50–1200)

## 2023-03-22 ENCOUNTER — Other Ambulatory Visit: Payer: Self-pay | Admitting: Internal Medicine

## 2023-03-22 ENCOUNTER — Other Ambulatory Visit: Payer: Self-pay

## 2023-03-22 DIAGNOSIS — E871 Hypo-osmolality and hyponatremia: Secondary | ICD-10-CM

## 2023-03-22 MED ORDER — FUROSEMIDE 20 MG PO TABS: ORAL_TABLET | ORAL | 0 refills | Status: DC

## 2023-03-23 ENCOUNTER — Telehealth: Payer: Self-pay

## 2023-03-23 ENCOUNTER — Other Ambulatory Visit: Payer: Self-pay

## 2023-03-23 ENCOUNTER — Emergency Department: Payer: Medicare Other

## 2023-03-23 ENCOUNTER — Inpatient Hospital Stay
Admission: EM | Admit: 2023-03-23 | Discharge: 2023-04-02 | DRG: 640 | Disposition: A | Discharge status: Skilled Nursing Facility | Payer: Medicare Other | Attending: Internal Medicine | Admitting: Internal Medicine

## 2023-03-23 DIAGNOSIS — K449 Diaphragmatic hernia without obstruction or gangrene: Secondary | ICD-10-CM | POA: Diagnosis not present

## 2023-03-23 DIAGNOSIS — Z8249 Family history of ischemic heart disease and other diseases of the circulatory system: Secondary | ICD-10-CM | POA: Diagnosis not present

## 2023-03-23 DIAGNOSIS — I959 Hypotension, unspecified: Secondary | ICD-10-CM | POA: Diagnosis not present

## 2023-03-23 DIAGNOSIS — R2689 Other abnormalities of gait and mobility: Secondary | ICD-10-CM | POA: Diagnosis not present

## 2023-03-23 DIAGNOSIS — I872 Venous insufficiency (chronic) (peripheral): Secondary | ICD-10-CM | POA: Diagnosis present

## 2023-03-23 DIAGNOSIS — Z6837 Body mass index (BMI) 37.0-37.9, adult: Secondary | ICD-10-CM

## 2023-03-23 DIAGNOSIS — Z9181 History of falling: Secondary | ICD-10-CM

## 2023-03-23 DIAGNOSIS — R000 Tachycardia, unspecified: Secondary | ICD-10-CM | POA: Diagnosis not present

## 2023-03-23 DIAGNOSIS — H9190 Unspecified hearing loss, unspecified ear: Secondary | ICD-10-CM | POA: Diagnosis present

## 2023-03-23 DIAGNOSIS — I5020 Unspecified systolic (congestive) heart failure: Secondary | ICD-10-CM | POA: Diagnosis not present

## 2023-03-23 DIAGNOSIS — E871 Hypo-osmolality and hyponatremia: Secondary | ICD-10-CM | POA: Diagnosis not present

## 2023-03-23 DIAGNOSIS — M7989 Other specified soft tissue disorders: Secondary | ICD-10-CM | POA: Diagnosis present

## 2023-03-23 DIAGNOSIS — I4891 Unspecified atrial fibrillation: Secondary | ICD-10-CM | POA: Diagnosis present

## 2023-03-23 DIAGNOSIS — Z853 Personal history of malignant neoplasm of breast: Secondary | ICD-10-CM

## 2023-03-23 DIAGNOSIS — Z8711 Personal history of peptic ulcer disease: Secondary | ICD-10-CM

## 2023-03-23 DIAGNOSIS — J209 Acute bronchitis, unspecified: Secondary | ICD-10-CM | POA: Diagnosis present

## 2023-03-23 DIAGNOSIS — R296 Repeated falls: Secondary | ICD-10-CM | POA: Diagnosis present

## 2023-03-23 DIAGNOSIS — Z880 Allergy status to penicillin: Secondary | ICD-10-CM

## 2023-03-23 DIAGNOSIS — R600 Localized edema: Secondary | ICD-10-CM | POA: Diagnosis present

## 2023-03-23 DIAGNOSIS — E1151 Type 2 diabetes mellitus with diabetic peripheral angiopathy without gangrene: Secondary | ICD-10-CM | POA: Diagnosis present

## 2023-03-23 DIAGNOSIS — F4322 Adjustment disorder with anxiety: Secondary | ICD-10-CM | POA: Diagnosis present

## 2023-03-23 DIAGNOSIS — M4310 Spondylolisthesis, site unspecified: Secondary | ICD-10-CM | POA: Diagnosis not present

## 2023-03-23 DIAGNOSIS — Z86711 Personal history of pulmonary embolism: Secondary | ICD-10-CM

## 2023-03-23 DIAGNOSIS — Z9221 Personal history of antineoplastic chemotherapy: Secondary | ICD-10-CM

## 2023-03-23 DIAGNOSIS — R9089 Other abnormal findings on diagnostic imaging of central nervous system: Secondary | ICD-10-CM | POA: Diagnosis not present

## 2023-03-23 DIAGNOSIS — Z66 Do not resuscitate: Secondary | ICD-10-CM | POA: Diagnosis present

## 2023-03-23 DIAGNOSIS — K219 Gastro-esophageal reflux disease without esophagitis: Secondary | ICD-10-CM | POA: Diagnosis present

## 2023-03-23 DIAGNOSIS — Z923 Personal history of irradiation: Secondary | ICD-10-CM | POA: Diagnosis not present

## 2023-03-23 DIAGNOSIS — R0689 Other abnormalities of breathing: Secondary | ICD-10-CM | POA: Diagnosis not present

## 2023-03-23 DIAGNOSIS — R918 Other nonspecific abnormal finding of lung field: Secondary | ICD-10-CM | POA: Diagnosis not present

## 2023-03-23 DIAGNOSIS — R443 Hallucinations, unspecified: Secondary | ICD-10-CM | POA: Diagnosis present

## 2023-03-23 DIAGNOSIS — Z1152 Encounter for screening for COVID-19: Secondary | ICD-10-CM | POA: Diagnosis not present

## 2023-03-23 DIAGNOSIS — R278 Other lack of coordination: Secondary | ICD-10-CM | POA: Diagnosis not present

## 2023-03-23 DIAGNOSIS — F4329 Adjustment disorder with other symptoms: Secondary | ICD-10-CM | POA: Diagnosis not present

## 2023-03-23 DIAGNOSIS — F05 Delirium due to known physiological condition: Secondary | ICD-10-CM | POA: Diagnosis present

## 2023-03-23 DIAGNOSIS — E66812 Obesity, class 2: Secondary | ICD-10-CM | POA: Diagnosis present

## 2023-03-23 DIAGNOSIS — E041 Nontoxic single thyroid nodule: Secondary | ICD-10-CM | POA: Diagnosis present

## 2023-03-23 DIAGNOSIS — M6281 Muscle weakness (generalized): Secondary | ICD-10-CM | POA: Diagnosis not present

## 2023-03-23 DIAGNOSIS — R062 Wheezing: Secondary | ICD-10-CM | POA: Diagnosis not present

## 2023-03-23 DIAGNOSIS — I6782 Cerebral ischemia: Secondary | ICD-10-CM | POA: Diagnosis not present

## 2023-03-23 DIAGNOSIS — R531 Weakness: Principal | ICD-10-CM

## 2023-03-23 DIAGNOSIS — Z803 Family history of malignant neoplasm of breast: Secondary | ICD-10-CM | POA: Diagnosis not present

## 2023-03-23 DIAGNOSIS — Z79899 Other long term (current) drug therapy: Secondary | ICD-10-CM

## 2023-03-23 DIAGNOSIS — Z743 Need for continuous supervision: Secondary | ICD-10-CM | POA: Diagnosis not present

## 2023-03-23 DIAGNOSIS — E1149 Type 2 diabetes mellitus with other diabetic neurological complication: Secondary | ICD-10-CM | POA: Diagnosis not present

## 2023-03-23 DIAGNOSIS — I5033 Acute on chronic diastolic (congestive) heart failure: Secondary | ICD-10-CM | POA: Diagnosis present

## 2023-03-23 DIAGNOSIS — I110 Hypertensive heart disease with heart failure: Secondary | ICD-10-CM | POA: Diagnosis present

## 2023-03-23 DIAGNOSIS — I700 Atherosclerosis of aorta: Secondary | ICD-10-CM | POA: Diagnosis not present

## 2023-03-23 DIAGNOSIS — I10 Essential (primary) hypertension: Secondary | ICD-10-CM | POA: Diagnosis present

## 2023-03-23 DIAGNOSIS — E669 Obesity, unspecified: Secondary | ICD-10-CM | POA: Diagnosis present

## 2023-03-23 DIAGNOSIS — K30 Functional dyspepsia: Secondary | ICD-10-CM | POA: Diagnosis not present

## 2023-03-23 DIAGNOSIS — G9341 Metabolic encephalopathy: Secondary | ICD-10-CM | POA: Diagnosis present

## 2023-03-23 DIAGNOSIS — R4182 Altered mental status, unspecified: Secondary | ICD-10-CM | POA: Diagnosis not present

## 2023-03-23 DIAGNOSIS — G8929 Other chronic pain: Secondary | ICD-10-CM | POA: Diagnosis not present

## 2023-03-23 DIAGNOSIS — I5031 Acute diastolic (congestive) heart failure: Secondary | ICD-10-CM | POA: Diagnosis not present

## 2023-03-23 DIAGNOSIS — E7800 Pure hypercholesterolemia, unspecified: Secondary | ICD-10-CM | POA: Diagnosis present

## 2023-03-23 DIAGNOSIS — F419 Anxiety disorder, unspecified: Secondary | ICD-10-CM | POA: Diagnosis not present

## 2023-03-23 DIAGNOSIS — R442 Other hallucinations: Secondary | ICD-10-CM | POA: Diagnosis not present

## 2023-03-23 DIAGNOSIS — M545 Low back pain, unspecified: Secondary | ICD-10-CM | POA: Diagnosis not present

## 2023-03-23 DIAGNOSIS — R0902 Hypoxemia: Secondary | ICD-10-CM | POA: Diagnosis not present

## 2023-03-23 DIAGNOSIS — E1142 Type 2 diabetes mellitus with diabetic polyneuropathy: Secondary | ICD-10-CM | POA: Diagnosis not present

## 2023-03-23 LAB — RESP PANEL BY RT-PCR (RSV, FLU A&B, COVID)  RVPGX2
Influenza A by PCR: NEGATIVE
Influenza B by PCR: NEGATIVE
Resp Syncytial Virus by PCR: NEGATIVE
SARS Coronavirus 2 by RT PCR: NEGATIVE

## 2023-03-23 LAB — CBC
HCT: 40 % (ref 36.0–46.0)
Hemoglobin: 13.8 g/dL (ref 12.0–15.0)
MCH: 30.2 pg (ref 26.0–34.0)
MCHC: 34.5 g/dL (ref 30.0–36.0)
MCV: 87.5 fL (ref 80.0–100.0)
Platelets: 155 10*3/uL (ref 150–400)
RBC: 4.57 MIL/uL (ref 3.87–5.11)
RDW: 14.3 % (ref 11.5–15.5)
WBC: 8.8 10*3/uL (ref 4.0–10.5)
nRBC: 0 % (ref 0.0–0.2)

## 2023-03-23 LAB — BASIC METABOLIC PANEL
Anion gap: 10 (ref 5–15)
BUN: 12 mg/dL (ref 8–23)
CO2: 23 mmol/L (ref 22–32)
Calcium: 9.2 mg/dL (ref 8.9–10.3)
Chloride: 94 mmol/L — ABNORMAL LOW (ref 98–111)
Creatinine, Ser: 0.62 mg/dL (ref 0.44–1.00)
GFR, Estimated: >60 mL/min (ref >60)
Glucose, Bld: 141 mg/dL — ABNORMAL HIGH (ref 70–99)
Potassium: 4.4 mmol/L (ref 3.5–5.1)
Sodium: 127 mmol/L — ABNORMAL LOW (ref 135–145)

## 2023-03-23 LAB — URINALYSIS, ROUTINE W REFLEX MICROSCOPIC
Bilirubin Urine: NEGATIVE
Glucose, UA: NEGATIVE mg/dL
Hgb urine dipstick: NEGATIVE
Ketones, ur: NEGATIVE mg/dL
Leukocytes,Ua: NEGATIVE
Nitrite: NEGATIVE
Protein, ur: NEGATIVE mg/dL
Specific Gravity, Urine: 1.009 (ref 1.005–1.030)
pH: 7 (ref 5.0–8.0)

## 2023-03-23 LAB — OSMOLALITY: Osmolality: 266 mosm/kg — ABNORMAL LOW (ref 275–295)

## 2023-03-23 MED ORDER — ACETAMINOPHEN 325 MG PO TABS
650.0000 mg | ORAL_TABLET | Freq: Four times a day (QID) | ORAL | Status: DC | PRN
  Administered 2023-03-28: 650 mg via ORAL
  Filled 2023-03-23: qty 2

## 2023-03-23 MED ORDER — SODIUM CHLORIDE 0.9 % IV BOLUS
1000.0000 mL | Freq: Once | INTRAVENOUS | Status: AC
  Administered 2023-03-23: 1000 mL via INTRAVENOUS

## 2023-03-23 MED ORDER — ONDANSETRON HCL 4 MG/2ML IJ SOLN: 4.0000 mg | Freq: Three times a day (TID) | INTRAMUSCULAR | Status: DC | PRN

## 2023-03-23 MED ORDER — SODIUM CHLORIDE 1 G PO TABS
1.0000 g | ORAL_TABLET | Freq: Two times a day (BID) | ORAL | Status: DC
Start: 2023-03-23 — End: 2023-03-24
  Administered 2023-03-24: 1 g via ORAL
  Filled 2023-03-23: qty 1

## 2023-03-23 MED ORDER — LORAZEPAM 2 MG/ML IJ SOLN
1.0000 mg | Freq: Once | INTRAMUSCULAR | Status: AC
  Administered 2023-03-23: 1 mg via INTRAVENOUS
  Filled 2023-03-23: qty 1

## 2023-03-23 MED ORDER — LORAZEPAM 2 MG/ML IJ SOLN: 1.0000 mg | Freq: Once | INTRAMUSCULAR | Status: AC

## 2023-03-23 MED ORDER — SODIUM CHLORIDE 0.9 % IV BOLUS
500.0000 mL | Freq: Once | INTRAVENOUS | Status: AC
  Administered 2023-03-23: 500 mL via INTRAVENOUS

## 2023-03-23 MED ORDER — ENOXAPARIN SODIUM 60 MG/0.6ML IJ SOSY
0.5000 mg/kg | PREFILLED_SYRINGE | INTRAMUSCULAR | Status: DC
  Administered 2023-03-24 – 2023-04-01 (×9): 57.5 mg via SUBCUTANEOUS
  Filled 2023-03-23 (×10): qty 0.6

## 2023-03-23 MED ORDER — LORAZEPAM 2 MG/ML IJ SOLN
INTRAMUSCULAR | Status: AC
  Administered 2023-03-23: 1 mg via INTRAVENOUS
  Filled 2023-03-23: qty 1

## 2023-03-23 MED ORDER — HYDRALAZINE HCL 20 MG/ML IJ SOLN: 5.0000 mg | INTRAMUSCULAR | Status: DC | PRN

## 2023-03-23 NOTE — Telephone Encounter (Signed)
Copied from CRM [id number]. Topic: Clinical - Medical Advice >> Mar 23, 2023  8:40 AM Deaijah H wrote: Reason for CRM: Patients daughter Mardene Celeste called in wanting to let know Dr. Lorin Picket nurse know Anita Walker is having hallucinations and is not sure if she needs to take her to the hospital or bring her to clinic / please call [phone]

## 2023-03-23 NOTE — ED Triage Notes (Signed)
Pt sent here by daughter Mardene Celeste called in wanting to let know Dr. Lorin Picket nurse know the pt is having hallucinations. Pt states that her legs are weak, pt states she does not walk as well. Pt has hx of breast cancer and a fib.

## 2023-03-23 NOTE — Progress Notes (Signed)
Anticoagulation monitoring(Lovenox):  83 yo  female ordered Lovenox 40 mg Q24h    There were no vitals filed for this visit. BMI 37.2    Lab Results  Component Value Date   CREATININE 0.62 03/23/2023   CREATININE 0.57 03/19/2023   CREATININE 0.76 12/20/2022   Estimated Creatinine Clearance: 69.3 mL/min (by C-G formula based on SCr of 0.62 mg/dL). Hemoglobin & Hematocrit     Component Value Date/Time   HGB 13.8 03/23/2023 1230   HGB 13.4 03/25/2020 1657   HCT 40.0 03/23/2023 1230   HCT 39.4 03/25/2020 1657     Per Protocol for Patient with estCrcl > 30 ml/min and BMI > 30, will transition to Lovenox 57.5 mg Q24h.

## 2023-03-23 NOTE — ED Provider Notes (Signed)
[facility] Provider Note    Event Date/Time   First MD Initiated Contact with Patient 03/23/23 1724     (approximate)   History   Hallucinations   HPI  Anita Walker is a 83 y.o. female  who was brought to the emergency department today because of concern for hallucinations. Patient herself complains primarily of leg swelling and pain.  She cannot say how long this has been going on for.  She does state that they will hurt if somebody bash is on them.  She denies any chest pain.     Physical Exam   Triage Vital Signs: ED Triage Vitals  Encounter Vitals Group     BP 03/23/23 1226 (!) 143/72     Systolic BP Percentile --      Diastolic BP Percentile --      Pulse Rate 03/23/23 1226 77     Resp 03/23/23 1226 18     Temp 03/23/23 1228 97.6 F (36.4 C)     Temp Source 03/23/23 1226 Oral     SpO2 03/23/23 1226 100 %     Weight --      Height 03/23/23 1226 5\' 9"  (1.753 m)     Head Circumference --      Peak Flow --      Pain Score 03/23/23 1226 0     Pain Loc --      Pain Education --      Exclude from Growth Chart --     Most recent vital signs: Vitals:   03/23/23 1228 03/23/23 1636  BP:  (!) 146/72  Pulse:  73  Resp:  18  Temp: 97.6 F (36.4 C) 98 F (36.7 C)  SpO2:  100%   General: Awake, alert, not completely oriented. CV:  Good peripheral perfusion. Regular rate and rhythm. Resp:  Normal effort. Lungs clear. Abd:  No distention.  Other:  Does not appear to be responding to internal stimuli.   ED Results / Procedures / Treatments   Labs (all labs ordered are listed, but only abnormal results are displayed) Labs Reviewed  BASIC METABOLIC PANEL - Abnormal; Notable for the following components:      Result Value   Sodium 127 (*)    Chloride 94 (*)    Glucose, Bld 141 (*)    All other components within normal limits  CBC  URINALYSIS, ROUTINE W REFLEX MICROSCOPIC  CBG MONITORING, ED      RADIOLOGY I independently  interpreted and visualized the CXR. My interpretation: No pneumonia Radiology interpretation: IMPRESSION:  No active disease. Moderate to large hiatal hernia.   I independently interpreted and visualized the CT head. My interpretation: No ICH Radiology interpretation:  IMPRESSION:  No acute CT finding. Age related volume loss. 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   MR brain IMPRESSION:  1. No acute intracranial abnormality.  2. Findings of chronic small vessel ischemia and volume loss.   PROCEDURES:  Critical Care performed: No   MEDICATIONS ORDERED IN ED: Medications - No data to display   IMPRESSION / MDM / ASSESSMENT AND PLAN / ED COURSE  I reviewed the triage vital signs and the nursing notes.                              Differential diagnosis includes, but is not limited to, infection, electrolyte abnormality, ICH  Patient's presentation is most consistent  with acute presentation with potential threat to life or bodily function.   The patient is on the cardiac monitor to evaluate for evidence of arrhythmia and/or significant heart rate changes.  Patient presented to the emergency department today because of concerns for hallucinations.  Per daughters at bedside patient also had extreme weakness.  Additionally there was concern for possible hypoxia given that EMS placed patient on 3 L however patient was able to be taken off and placed on room air here in the emergency department without any desaturation.  UA without findings concerning for infection.  Blood work without concerning leukocytosis.  CT head without intracranial hemorrhage.  Will obtain MRI to evaluate for possible CVA.  MRI without acute CVA. At this time somewhat unclear etiology of weakness and hallucinations. Discussed with Dr. Clyde Lundborg with the hospitalist service who will evaluate for admission.      FINAL CLINICAL IMPRESSION(S) / ED DIAGNOSES   Final diagnoses:  Weakness   Altered mental status, unspecified altered mental status type      Note:  This document was prepared using Dragon voice recognition software and may include unintentional dictation errors.    Phineas Semen, MD 03/23/23 2330

## 2023-03-23 NOTE — Telephone Encounter (Signed)
Patient daughter aware to take pt to the ED.

## 2023-03-23 NOTE — ED Provider Triage Note (Signed)
Emergency Medicine Provider Triage Evaluation Note  Anita Walker , a 83 y.o. female  was evaluated in triage.  Pt sent to ED via EMS for also hallucinations.  Patient is a poor historian, however she is answering question appropriately.  Patient reports LE weakness and "shakiness".   Review of Systems  Positive:  Negative:   Physical Exam  BP (!) 143/72   Pulse 77   Temp 97.6 F (36.4 C) (Oral)   Resp 18   Ht 5\' 9"  (1.753 m)   SpO2 100%   BMI 37.21 kg/m  Gen:   Awake, no distress  A&O x4 Resp:  Normal effort  MSK:   Moves extremities with some difficulty  Other:    Medical Decision Making  Medically screening exam initiated at 12:31 PM.  Appropriate orders placed.  Anita Walker was informed that the remainder of the evaluation will be completed by another provider, this initial triage assessment does not replace that evaluation, and the importance of remaining in the ED until their evaluation is complete.    Romeo Apple, Aileana Hodder A, PA-C 03/23/23 1238

## 2023-03-23 NOTE — ED Notes (Signed)
Patient back from MRI. Reconnected to monitor, family at bedside.

## 2023-03-23 NOTE — H&P (Signed)
History and Physical    Anita Walker WUJ:[id number] DOB: [birth year] DOA: 03/23/2023  Referring MD/NP/PA:   PCP: Dale [place], MD   Patient coming from:  The patient is coming from home.     Chief Complaint: Hallucination, weakness, cough, wheezing  HPI: Anita Walker is a 83 y.o. female with medical history significant of hypertension, hyperlipidemia, diet-controlled diabetes, PE not on anticoagulants due to fall, anxiety, gastric ulcer, GERD, anemia, breast cancer (s/p of left lumpectomy, chemo and radiation therapy), who presents with hallucination, weakness, cough and wheezing.  Per her daughter at the bedside, patient fell accidentally 3 days ago.  No loss of consciousness.  No significant injury.  After fall, patient developed hallucination, hearing music and seeing people who are not there.  Patient has generalized weakness.  No unilateral numbness or tingling in the extremities.  No facial droop or slurred speech.  Patient has dry cough and wheezing in the past several days.  No difficulty breathing or chest pain.  No fever or chills. Patient does not have nausea, vomiting, diarrhea or abdominal pain.  No symptoms of UTI  Data reviewed independently and ED Course: pt was found to have WBC 8.8, negative PCR for COVID, flu and RSV, sodium 127, GFR> 60, temperature normal, blood pressure 143/72 which dropped to 80/42, diet 92/48 after give 2 dose of 5 mg of Ativan, heart rate 50-80s, RR 29, 18.  Oxygen saturation 91% on room air initially, which improved to 96% on room air when I saw patient in ED.  CT of head negative.  MRI of the brain is negative for stroke.  Chest x-ray negative for infiltration, and showed large hiatal hernia.  Patient is admitted to tele bed as inpatient.  MRI_brain: 1. No acute intracranial abnormality. 2. Findings of chronic small vessel ischemia and volume loss.   EKG: I have personally reviewed.  Sinus rhythm, LAD, LVH.  Review of Systems:   General:  no fevers, chills, no body weight gain, has fatigue HEENT: no blurry vision, hearing changes or sore throat Respiratory: has dyspnea, coughing, wheezing CV: no chest pain, no palpitations GI: no nausea, vomiting, abdominal pain, diarrhea, constipation GU: no dysuria, burning on urination, increased urinary frequency, hematuria  Ext: has leg edema Neuro: no unilateral weakness, numbness, or tingling, no vision change or hearing loss. Has fall Skin: no rash, no skin tear. MSK: No muscle spasm, no deformity, no limitation of range of movement in spin Heme: No easy bruising.  Travel history: No recent long distant travel.   Allergy:  Allergies  Allergen Reactions   Penicillins Other (See Comments)    Unknown- pt states been a long time ago  Has patient had a PCN reaction causing immediate rash, facial/tongue/throat swelling, SOB or lightheadedness with hypotension: Unknown Has patient had a PCN reaction causing severe rash involving mucus membranes or skin necrosis: Unknown Has patient had a PCN reaction that required hospitalization: Unknown Has patient had a PCN reaction occurring within the last 10 years: Unknown If all of the above answers are "NO", then may proceed with Cephalosporin use.   Penicillin V Potassium Nausea And Vomiting    Past Medical History:  Diagnosis Date   Anemia    Arthritis    Atrial fibrillation (HCC)    Breast cancer (HCC)    s/p lumpectomy 1992.  s/p chemo and xrt left breast   Diabetes mellitus (HCC)    Edema    feet/legs   Gastric ulcer  GERD (gastroesophageal reflux disease)    HOH (hard of hearing)    aides   Hypercholesterolemia    Hypertension    Personal history of chemotherapy    Pulmonary emboli [facility])     Past Surgical History:  Procedure Laterality Date   BREAST LUMPECTOMY  03/06/1990   left breast   CATARACT EXTRACTION W/PHACO Right 12/26/2016   Procedure: CATARACT EXTRACTION PHACO AND INTRAOCULAR LENS PLACEMENT (IOC)-RIGHT  DIABETIC;  Surgeon: Galen Manila, MD;  Location: [facility] ORS;  Service: Ophthalmology;  Laterality: Right;  [place] 00:47 AP% 24.5 CDE 11.62 Fluid pack lot # [id number]   CATARACT EXTRACTION W/PHACO Left 01/23/2017   Procedure: CATARACT EXTRACTION PHACO AND INTRAOCULAR LENS PLACEMENT (IOC);  Surgeon: Galen Manila, MD;  Location: [facility] ORS;  Service: Ophthalmology;  Laterality: Left;  [place] 00:50 AP% 16.1 CDE 8.18 Fluid pack lot #[id number]   COLONOSCOPY WITH PROPOFOL N/A 07/08/2019   Procedure: COLONOSCOPY WITH PROPOFOL;  Surgeon: Midge Minium, MD;  Location: [facility] ENDOSCOPY;  Service: Endoscopy;  Laterality: N/A;    Social History:  reports that she has never smoked. She has never used smokeless tobacco. She reports that she does not drink alcohol and does not use drugs.  Family History:  Family History  Problem Relation Age of Onset   Heart disease Father    Heart disease Brother        s/p CABG   Breast cancer Daughter    Colon cancer Neg Hx      Prior to Admission medications   Medication Sig Start Date End Date Taking? Authorizing Provider  albuterol (VENTOLIN HFA) 108 (90 Base) MCG/ACT inhaler Inhale 1-2 puffs into the lungs every 6 (six) hours as needed for wheezing. 03/07/23   Dale Stark, MD  amLODipine (NORVASC) 2.5 MG tablet Take 1 tablet (2.5 mg total) by mouth daily. 06/21/22   Dale [place], MD  busPIRone (BUSPAR) 5 MG tablet TAKE 1 TABLET(5 MG) BY MOUTH DAILY 02/14/23   Dale [place], MD  diphenhydrAMINE (BENADRYL) 25 mg capsule Take 25 mg at bedtime as needed by mouth for sleep.     [provider]  feeding supplement (ENSURE ENLIVE / ENSURE PLUS) LIQD Take 237 mLs by mouth 2 (two) times daily between meals. 04/19/20   Enedina Finner, MD  ferrous sulfate (FEROSUL) 325 (65 FE) MG tablet Take 1 tablet (325 mg total) by mouth every other day. One tablet qod 06/21/22   Dale [place], MD  furosemide (LASIX) 20 MG tablet 1 tab by mouth daily for 3 days then 1 tab every  other day for 1 week. 03/22/23   Dale [place], MD  glucose blood (CONTOUR TEST) test strip USE TO TEST BLOOD SUGAR ONCE DAILY 03/19/23   Dale [place], MD  losartan (COZAAR) 100 MG tablet TAKE 1 TABLET(100 MG) BY MOUTH DAILY 03/13/22   Dale [place], MD  Multiple Vitamin (MULTIVITAMIN WITH MINERALS) TABS tablet Take 1 tablet daily by mouth.    [provider]  mupirocin ointment (BACTROBAN) 2 % Apply 1 Application topically 2 (two) times daily. 12/29/22   Eusebio Friendly B, PA-C  pantoprazole (PROTONIX) 40 MG tablet TAKE 1 TABLET(40 MG) BY MOUTH DAILY 08/16/22   Dale [place], MD  simvastatin (ZOCOR) 10 MG tablet Take 1 tablet (10 mg total) by mouth daily. 06/21/22   Dale , MD    Physical Exam: Vitals:   03/23/23 2252 03/23/23 2300 03/23/23 2330 03/24/23 0030  BP: (!) 92/48 102/66 (!) 124/108 119/89  Pulse: 63 66 71 75  Resp:  18 (!) 21 (!) 22 20  Temp:      TempSrc:      SpO2: 95% 95% 95% 95%  Height:       General: Not in acute distress HEENT:       Eyes: PERRL, EOMI, no jaundice       ENT: No discharge from the ears and nose, no pharynx injection, no tonsillar enlargement.        Neck: No JVD, no bruit, no mass felt. Heme: No neck lymph node enlargement. Cardiac: S1/S2, RRR, No murmurs, No gallops or rubs. Respiratory: Has wheezing bilaterally GI: Soft, nondistended, nontender, no rebound pain, no organomegaly, BS present. GU: No hematuria Ext: has 2+ pitting leg edema bilaterally. 1+DP/PT pulse bilaterally. Musculoskeletal: No joint deformities, No joint redness or warmth, no limitation of ROM in spin. Skin: No rashes.  Neuro: Alert, oriented X3, cranial nerves II-XII grossly intact, moves all extremities normally.  Psych: Patient is not psychotic, no suicidal or hemocidal ideation.  Has hallucination  Labs on Admission: I have personally reviewed following labs and imaging studies  CBC: Recent Labs  Lab 03/23/23 1230  WBC 8.8  HGB 13.8  HCT 40.0   MCV 87.5  PLT 155   Basic Metabolic Panel: Recent Labs  Lab 03/19/23 1100 03/23/23 1230  NA 129* 127*  K 4.9 4.4  CL 93* 94*  CO2 29 23  GLUCOSE 108* 141*  BUN 15 12  CREATININE 0.57 0.62  CALCIUM 9.5 9.2   GFR: Estimated Creatinine Clearance: 69.3 mL/min (by C-G formula based on SCr of 0.62 mg/dL). Liver Function Tests: Recent Labs  Lab 03/19/23 1100  AST 19  ALT 19  ALKPHOS 71  BILITOT 0.4  PROT 6.6  ALBUMIN 4.2   No results for input(s): "LIPASE", "AMYLASE" in the last 168 hours. No results for input(s): "AMMONIA" in the last 168 hours. Coagulation Profile: No results for input(s): "INR", "PROTIME" in the last 168 hours. Cardiac Enzymes: No results for input(s): "CKTOTAL", "CKMB", "CKMBINDEX", "TROPONINI" in the last 168 hours. BNP (last 3 results) No results for input(s): "PROBNP" in the last 8760 hours. HbA1C: No results for input(s): "HGBA1C" in the last 72 hours. CBG: No results for input(s): "GLUCAP" in the last 168 hours. Lipid Profile: No results for input(s): "CHOL", "HDL", "LDLCALC", "TRIG", "CHOLHDL", "LDLDIRECT" in the last 72 hours. Thyroid Function Tests: No results for input(s): "TSH", "T4TOTAL", "FREET4", "T3FREE", "THYROIDAB" in the last 72 hours. Anemia Panel: No results for input(s): "VITAMINB12", "FOLATE", "FERRITIN", "TIBC", "IRON", "RETICCTPCT" in the last 72 hours. Urine analysis:    Component Value Date/Time   COLORURINE YELLOW (A) 03/23/2023 1820   APPEARANCEUR CLEAR (A) 03/23/2023 1820   APPEARANCEUR Clear 11/15/2022 1302   LABSPEC 1.009 03/23/2023 1820   LABSPEC 1.008 07/16/2013 1405   PHURINE 7.0 03/23/2023 1820   GLUCOSEU NEGATIVE 03/23/2023 1820   GLUCOSEU NEGATIVE 11/03/2022 1123   HGBUR NEGATIVE 03/23/2023 1820   BILIRUBINUR NEGATIVE 03/23/2023 1820   BILIRUBINUR Negative 11/15/2022 1302   BILIRUBINUR Negative 07/16/2013 1405   KETONESUR NEGATIVE 03/23/2023 1820   PROTEINUR NEGATIVE 03/23/2023 1820   UROBILINOGEN 1.0  11/03/2022 1123   NITRITE NEGATIVE 03/23/2023 1820   LEUKOCYTESUR NEGATIVE 03/23/2023 1820   LEUKOCYTESUR 3+ 07/16/2013 1405   Sepsis Labs: @LABRCNTIP (procalcitonin:4,lacticidven:4) ) Recent Results (from the past 240 hours)  Resp panel by RT-PCR (RSV, Flu A&B, Covid) Anterior Nasal Swab     Status: None   Collection Time: 03/23/23  7:31 PM   Specimen: Anterior Nasal  Swab  Result Value Ref Range Status   SARS Coronavirus 2 by RT PCR NEGATIVE NEGATIVE Final    Comment: (NOTE) SARS-CoV-2 target nucleic acids are NOT DETECTED.  The SARS-CoV-2 RNA is generally detectable in upper respiratory specimens during the acute phase of infection. The lowest concentration of SARS-CoV-2 viral copies this assay can detect is 138 copies/mL. A negative result does not preclude SARS-Cov-2 infection and should not be used as the sole basis for treatment or other patient management decisions. A negative result may occur with  improper specimen collection/handling, submission of specimen other than nasopharyngeal swab, presence of viral mutation(s) within the areas targeted by this assay, and inadequate number of viral copies(<138 copies/mL). A negative result must be combined with clinical observations, patient history, and epidemiological information. The expected result is Negative.  Fact Sheet for Patients:  BloggerCourse.com  Fact Sheet for Healthcare Providers:  SeriousBroker.it  This test is no t yet approved or cleared by the [place] FDA and  has been authorized for detection and/or diagnosis of SARS-CoV-2 by FDA under an Emergency Use Authorization (EUA). This EUA will remain  in effect (meaning this test can be used) for the duration of the COVID-19 declaration under Section 564(b)(1) of the Act, 21 U.S.C.section 360bbb-3(b)(1), unless the authorization is terminated  or revoked sooner.       Influenza A by PCR NEGATIVE NEGATIVE  Final   Influenza B by PCR NEGATIVE NEGATIVE Final    Comment: (NOTE) The Xpert Xpress SARS-CoV-2/FLU/RSV plus assay is intended as an aid in the diagnosis of influenza from Nasopharyngeal swab specimens and should not be used as a sole basis for treatment. Nasal washings and aspirates are unacceptable for Xpert Xpress SARS-CoV-2/FLU/RSV testing.  Fact Sheet for Patients: BloggerCourse.com  Fact Sheet for Healthcare Providers: SeriousBroker.it  This test is not yet approved or cleared by the [place] FDA and has been authorized for detection and/or diagnosis of SARS-CoV-2 by FDA under an Emergency Use Authorization (EUA). This EUA will remain in effect (meaning this test can be used) for the duration of the COVID-19 declaration under Section 564(b)(1) of the Act, 21 U.S.C. section 360bbb-3(b)(1), unless the authorization is terminated or revoked.     Resp Syncytial Virus by PCR NEGATIVE NEGATIVE Final    Comment: (NOTE) Fact Sheet for Patients: BloggerCourse.com  Fact Sheet for Healthcare Providers: SeriousBroker.it  This test is not yet approved or cleared by the [place] FDA and has been authorized for detection and/or diagnosis of SARS-CoV-2 by FDA under an Emergency Use Authorization (EUA). This EUA will remain in effect (meaning this test can be used) for the duration of the COVID-19 declaration under Section 564(b)(1) of the Act, 21 U.S.C. section 360bbb-3(b)(1), unless the authorization is terminated or revoked.  Performed at [facility], [address] [place], [place] [address]      Radiological Exams on Admission:   Assessment/Plan Principal Problem:   Hyponatremia Active Problems:   Bilateral leg edema   Acute bronchitis   Hypertension   Hypercholesteremia   Type 2 diabetes mellitus with neurological complications (HCC)   Mixed  emotional features as adjustment reaction   Anxiety   Hallucination   Obesity (BMI 30-39.9)   Assessment and Plan:  Hyponatremia: Sodium 127 which was 129 on 03/19/2023), seem to be chronic issue. -Admitted to telemetry bed as inpatient - Will check urine sodium, urine osmolality, serum osmolality. - Fluid restriction - IVF: 1.5L NS in ED, will not give more IVF since  I patient may have undiagnosed CHF due to bilateral 2+ leg edema -Will not give sodium chloride tablet due to suspected CHF - f/u by BMP q8h  Bilateral leg edema: Patient has  2+ leg edema, suspecting undiagnosed CHF.  Patient has hyponatremia limiting diuresis.   -Will give 1 dose of testing Lasix 40 mg now and  follow-up sodium level closely -Follow-up 2D echo -Follow-up lower extremity venous Doppler to rule out a DVT  Acute bronchitis: Patient has dry cough, wheezing on auscultation.  Currently no oxygen desaturation.  Chest x-ray negative for infiltration. -Solu-Medrol 40 mg twice daily -Bronchodilators -Check RVP  Hypertension -IV hydralazine as needed -Hold Cozaar due to hyponatremia -Continue amlodipine  Hypercholesteremia -Zocor  Diet controlled Type 2 diabetes mellitus with neurological complications (HCC): A1c 6.0 recently.  Well-controlled.  Patient is not taking medications. -Check blood sugar every morning  Mixed emotional features as adjustment reaction and Anxiety -BuSpar  Hallucination: Etiology is not clear.  MRI of brain negative.  Hyponatremia and history of psych issues may have contributed partially. -May need to consult psychiatry tomorrow morning -check Vb12 level   Obesity (BMI 30-39.9): Body weight 114.3 kg, BMI 37.22 -Encourage losing weight -Exercise and healthy diet       DVT ppx: SQ Lovenox  Code Status: DNR per pt and her daughter  Family Communication: Yes, patient's daughter    at bed side.       Disposition Plan:  Anticipate discharge back to previous  environment  Consults called: None  Admission status and Level of care: Telemetry Cardiac:     as inpt        Dispo: The patient is from: Home              Anticipated d/c is to: Home              Anticipated d/c date is: 2 days              Patient currently is not medically stable to d/c.    Severity of Illness:  The appropriate patient status for this patient is INPATIENT. Inpatient status is judged to be reasonable and necessary in order to provide the required intensity of service to ensure the patient's safety. The patient's presenting symptoms, physical exam findings, and initial radiographic and laboratory data in the context of their chronic comorbidities is felt to place them at high risk for further clinical deterioration. Furthermore, it is not anticipated that the patient will be medically stable for discharge from the hospital within 2 midnights of admission.   * I certify that at the point of admission it is my clinical judgment that the patient will require inpatient hospital care spanning beyond 2 midnights from the point of admission due to high intensity of service, high risk for further deterioration and high frequency of surveillance required.*       Date of Service 03/24/2023    Lorretta Harp [facility]   If 7PM-7AM, please contact night-coverage [email or web address] 03/24/2023, 2:32 AM

## 2023-03-23 NOTE — ED Notes (Signed)
Patient taken to MRI at this time 

## 2023-03-23 NOTE — ED Notes (Signed)
Patient's O2 turned off at this time to see how patient's O2 levels are. Patient does not normally require home O2.

## 2023-03-23 NOTE — ED Notes (Signed)
First nurse note. Per EMS, pt is coming from home via EMS. EMS sts that pt is here due to a fall. Since than pt has been having visual and auditory hallucination with darker urine than normal. Per EMS pt vs are elevated.  Pt has been given of NS via 22g right hand and is currently on 2l/ min via [place] which is not normal for her.

## 2023-03-24 ENCOUNTER — Inpatient Hospital Stay: Payer: Medicare Other

## 2023-03-24 ENCOUNTER — Inpatient Hospital Stay (HOSPITAL_COMMUNITY)
Admit: 2023-03-24 | Discharge: 2023-03-24 | Disposition: A | Discharge status: Home / Self Care | Payer: Medicare Other | Attending: Internal Medicine | Admitting: Internal Medicine

## 2023-03-24 ENCOUNTER — Encounter: Payer: Self-pay | Admitting: Internal Medicine

## 2023-03-24 DIAGNOSIS — F05 Delirium due to known physiological condition: Secondary | ICD-10-CM | POA: Diagnosis present

## 2023-03-24 DIAGNOSIS — J209 Acute bronchitis, unspecified: Secondary | ICD-10-CM | POA: Diagnosis present

## 2023-03-24 DIAGNOSIS — F4329 Adjustment disorder with other symptoms: Secondary | ICD-10-CM | POA: Diagnosis not present

## 2023-03-24 DIAGNOSIS — I4891 Unspecified atrial fibrillation: Secondary | ICD-10-CM | POA: Diagnosis present

## 2023-03-24 DIAGNOSIS — I5033 Acute on chronic diastolic (congestive) heart failure: Secondary | ICD-10-CM | POA: Diagnosis present

## 2023-03-24 DIAGNOSIS — M6281 Muscle weakness (generalized): Secondary | ICD-10-CM | POA: Diagnosis not present

## 2023-03-24 DIAGNOSIS — G8929 Other chronic pain: Secondary | ICD-10-CM | POA: Diagnosis not present

## 2023-03-24 DIAGNOSIS — Z923 Personal history of irradiation: Secondary | ICD-10-CM | POA: Diagnosis not present

## 2023-03-24 DIAGNOSIS — R278 Other lack of coordination: Secondary | ICD-10-CM | POA: Diagnosis not present

## 2023-03-24 DIAGNOSIS — I10 Essential (primary) hypertension: Secondary | ICD-10-CM | POA: Diagnosis not present

## 2023-03-24 DIAGNOSIS — G9341 Metabolic encephalopathy: Secondary | ICD-10-CM | POA: Diagnosis present

## 2023-03-24 DIAGNOSIS — R296 Repeated falls: Secondary | ICD-10-CM | POA: Diagnosis present

## 2023-03-24 DIAGNOSIS — Z9221 Personal history of antineoplastic chemotherapy: Secondary | ICD-10-CM | POA: Diagnosis not present

## 2023-03-24 DIAGNOSIS — Z66 Do not resuscitate: Secondary | ICD-10-CM | POA: Diagnosis present

## 2023-03-24 DIAGNOSIS — Z8249 Family history of ischemic heart disease and other diseases of the circulatory system: Secondary | ICD-10-CM | POA: Diagnosis not present

## 2023-03-24 DIAGNOSIS — Z743 Need for continuous supervision: Secondary | ICD-10-CM | POA: Diagnosis not present

## 2023-03-24 DIAGNOSIS — M545 Low back pain, unspecified: Secondary | ICD-10-CM | POA: Diagnosis not present

## 2023-03-24 DIAGNOSIS — E669 Obesity, unspecified: Secondary | ICD-10-CM | POA: Diagnosis present

## 2023-03-24 DIAGNOSIS — R600 Localized edema: Secondary | ICD-10-CM | POA: Diagnosis not present

## 2023-03-24 DIAGNOSIS — Z6837 Body mass index (BMI) 37.0-37.9, adult: Secondary | ICD-10-CM | POA: Diagnosis not present

## 2023-03-24 DIAGNOSIS — R918 Other nonspecific abnormal finding of lung field: Secondary | ICD-10-CM | POA: Diagnosis not present

## 2023-03-24 DIAGNOSIS — M4310 Spondylolisthesis, site unspecified: Secondary | ICD-10-CM | POA: Diagnosis not present

## 2023-03-24 DIAGNOSIS — H9190 Unspecified hearing loss, unspecified ear: Secondary | ICD-10-CM | POA: Diagnosis present

## 2023-03-24 DIAGNOSIS — I110 Hypertensive heart disease with heart failure: Secondary | ICD-10-CM | POA: Diagnosis present

## 2023-03-24 DIAGNOSIS — R443 Hallucinations, unspecified: Secondary | ICD-10-CM | POA: Diagnosis present

## 2023-03-24 DIAGNOSIS — E1149 Type 2 diabetes mellitus with other diabetic neurological complication: Secondary | ICD-10-CM | POA: Diagnosis present

## 2023-03-24 DIAGNOSIS — I5020 Unspecified systolic (congestive) heart failure: Secondary | ICD-10-CM | POA: Diagnosis not present

## 2023-03-24 DIAGNOSIS — I5031 Acute diastolic (congestive) heart failure: Secondary | ICD-10-CM

## 2023-03-24 DIAGNOSIS — E7800 Pure hypercholesterolemia, unspecified: Secondary | ICD-10-CM | POA: Diagnosis present

## 2023-03-24 DIAGNOSIS — I959 Hypotension, unspecified: Secondary | ICD-10-CM | POA: Diagnosis not present

## 2023-03-24 DIAGNOSIS — Z1152 Encounter for screening for COVID-19: Secondary | ICD-10-CM | POA: Diagnosis not present

## 2023-03-24 DIAGNOSIS — F4322 Adjustment disorder with anxiety: Secondary | ICD-10-CM | POA: Diagnosis present

## 2023-03-24 DIAGNOSIS — E041 Nontoxic single thyroid nodule: Secondary | ICD-10-CM | POA: Diagnosis present

## 2023-03-24 DIAGNOSIS — E871 Hypo-osmolality and hyponatremia: Secondary | ICD-10-CM | POA: Diagnosis present

## 2023-03-24 DIAGNOSIS — E1151 Type 2 diabetes mellitus with diabetic peripheral angiopathy without gangrene: Secondary | ICD-10-CM | POA: Diagnosis present

## 2023-03-24 DIAGNOSIS — I872 Venous insufficiency (chronic) (peripheral): Secondary | ICD-10-CM | POA: Diagnosis present

## 2023-03-24 DIAGNOSIS — Z803 Family history of malignant neoplasm of breast: Secondary | ICD-10-CM | POA: Diagnosis not present

## 2023-03-24 DIAGNOSIS — F419 Anxiety disorder, unspecified: Secondary | ICD-10-CM | POA: Diagnosis not present

## 2023-03-24 DIAGNOSIS — E1142 Type 2 diabetes mellitus with diabetic polyneuropathy: Secondary | ICD-10-CM | POA: Diagnosis not present

## 2023-03-24 DIAGNOSIS — R062 Wheezing: Secondary | ICD-10-CM | POA: Diagnosis not present

## 2023-03-24 DIAGNOSIS — R2689 Other abnormalities of gait and mobility: Secondary | ICD-10-CM | POA: Diagnosis not present

## 2023-03-24 LAB — BASIC METABOLIC PANEL
Anion gap: 7 (ref 5–15)
Anion gap: 11 (ref 5–15)
BUN: 12 mg/dL (ref 8–23)
BUN: 11 mg/dL (ref 8–23)
CO2: 26 mmol/L (ref 22–32)
CO2: 24 mmol/L (ref 22–32)
Calcium: 9.4 mg/dL (ref 8.9–10.3)
Calcium: 9.3 mg/dL (ref 8.9–10.3)
Chloride: 96 mmol/L — ABNORMAL LOW (ref 98–111)
Chloride: 95 mmol/L — ABNORMAL LOW (ref 98–111)
Creatinine, Ser: 0.58 mg/dL (ref 0.44–1.00)
Creatinine, Ser: 0.61 mg/dL (ref 0.44–1.00)
GFR, Estimated: >60 mL/min (ref >60)
GFR, Estimated: >60 mL/min (ref >60)
Glucose, Bld: 127 mg/dL — ABNORMAL HIGH (ref 70–99)
Glucose, Bld: 157 mg/dL — ABNORMAL HIGH (ref 70–99)
Potassium: 4.1 mmol/L (ref 3.5–5.1)
Potassium: 3.9 mmol/L (ref 3.5–5.1)
Sodium: 129 mmol/L — ABNORMAL LOW (ref 135–145)
Sodium: 130 mmol/L — ABNORMAL LOW (ref 135–145)

## 2023-03-24 LAB — SODIUM, URINE, RANDOM: Sodium, Ur: 67 mmol/L

## 2023-03-24 LAB — RESPIRATORY PANEL BY PCR

## 2023-03-24 LAB — ECHOCARDIOGRAM COMPLETE
AR max vel: 1.61 cm2
AV Area VTI: 1.68 cm2
AV Area mean vel: 1.41 cm2
AV Mean grad: 12.5 mm[Hg]
AV Peak grad: 25.4 mm[Hg]
Ao pk vel: 2.52 m/s
Area-P 1/2: 6.43 cm2
Calc EF: 40.3 %
Height: 69 in
MV VTI: 2.95 cm2
S' Lateral: 3.2 cm
Single Plane A2C EF: 40 %
Single Plane A4C EF: 41.7 %
Weight: 4031.77 [oz_av]

## 2023-03-24 LAB — OSMOLALITY, URINE: Osmolality, Ur: 326 mosm/kg (ref 300–900)

## 2023-03-24 LAB — CBC
HCT: 40.9 % (ref 36.0–46.0)
Hemoglobin: 14 g/dL (ref 12.0–15.0)
MCH: 30.1 pg (ref 26.0–34.0)
MCHC: 34.2 g/dL (ref 30.0–36.0)
MCV: 88 fL (ref 80.0–100.0)
Platelets: 178 10*3/uL (ref 150–400)
RBC: 4.65 MIL/uL (ref 3.87–5.11)
RDW: 14.6 % (ref 11.5–15.5)
WBC: 3.5 10*3/uL — ABNORMAL LOW (ref 4.0–10.5)
nRBC: 0.6 % — ABNORMAL HIGH (ref 0.0–0.2)

## 2023-03-24 LAB — VITAMIN B12: Vitamin B-12: 1032 pg/mL — ABNORMAL HIGH (ref 180–914)

## 2023-03-24 LAB — BRAIN NATRIURETIC PEPTIDE: B Natriuretic Peptide: 147.2 pg/mL — ABNORMAL HIGH (ref 0.0–100.0)

## 2023-03-24 LAB — CBG MONITORING, ED: Glucose-Capillary: 129 mg/dL — ABNORMAL HIGH (ref 70–99)

## 2023-03-24 MED ORDER — ADULT MULTIVITAMIN W/MINERALS CH
1.0000 | ORAL_TABLET | Freq: Every day | ORAL | Status: DC
  Administered 2023-03-24 – 2023-04-02 (×10): 1 via ORAL
  Filled 2023-03-24 (×10): qty 1

## 2023-03-24 MED ORDER — METHYLPREDNISOLONE SODIUM SUCC 40 MG IJ SOLR
40.0000 mg | Freq: Two times a day (BID) | INTRAMUSCULAR | Status: DC
  Administered 2023-03-24 – 2023-03-25 (×4): 40 mg via INTRAVENOUS
  Filled 2023-03-24 (×4): qty 1

## 2023-03-24 MED ORDER — FUROSEMIDE 10 MG/ML IJ SOLN
60.0000 mg | Freq: Once | INTRAMUSCULAR | Status: AC
  Administered 2023-03-24: 60 mg via INTRAVENOUS
  Filled 2023-03-24: qty 8

## 2023-03-24 MED ORDER — AMLODIPINE BESYLATE 5 MG PO TABS
2.5000 mg | ORAL_TABLET | Freq: Every day | ORAL | Status: DC
  Administered 2023-03-24 – 2023-03-30 (×7): 2.5 mg via ORAL
  Filled 2023-03-24 (×9): qty 1

## 2023-03-24 MED ORDER — ALBUTEROL SULFATE (2.5 MG/3ML) 0.083% IN NEBU: 2.5000 mg | INHALATION_SOLUTION | RESPIRATORY_TRACT | Status: DC | PRN

## 2023-03-24 MED ORDER — DIPHENHYDRAMINE HCL 25 MG PO CAPS: 25.0000 mg | ORAL_CAPSULE | Freq: Every evening | ORAL | Status: DC | PRN

## 2023-03-24 MED ORDER — IPRATROPIUM-ALBUTEROL 0.5-2.5 (3) MG/3ML IN SOLN
3.0000 mL | RESPIRATORY_TRACT | Status: DC
  Administered 2023-03-24 – 2023-03-26 (×17): 3 mL via RESPIRATORY_TRACT
  Filled 2023-03-24 (×17): qty 3

## 2023-03-24 MED ORDER — SIMVASTATIN 20 MG PO TABS
10.0000 mg | ORAL_TABLET | Freq: Every day | ORAL | Status: DC
Start: 2023-03-24 — End: 2023-04-02
  Administered 2023-03-24 – 2023-04-02 (×10): 10 mg via ORAL
  Filled 2023-03-24 (×10): qty 1

## 2023-03-24 MED ORDER — FERROUS SULFATE 325 (65 FE) MG PO TABS
325.0000 mg | ORAL_TABLET | ORAL | Status: DC
  Administered 2023-03-24 – 2023-04-01 (×5): 325 mg via ORAL
  Filled 2023-03-24 (×7): qty 1

## 2023-03-24 MED ORDER — FUROSEMIDE 10 MG/ML IJ SOLN
40.0000 mg | Freq: Once | INTRAMUSCULAR | Status: AC
  Administered 2023-03-24: 40 mg via INTRAVENOUS
  Filled 2023-03-24: qty 4

## 2023-03-24 MED ORDER — DM-GUAIFENESIN ER 30-600 MG PO TB12
1.0000 | ORAL_TABLET | Freq: Two times a day (BID) | ORAL | Status: DC | PRN
  Administered 2023-03-25 – 2023-03-26 (×4): 1 via ORAL
  Filled 2023-03-24 (×4): qty 1

## 2023-03-24 MED ORDER — ASPIRIN 81 MG PO TBEC
81.0000 mg | DELAYED_RELEASE_TABLET | Freq: Every day | ORAL | Status: DC
  Administered 2023-03-24 – 2023-04-02 (×10): 81 mg via ORAL
  Filled 2023-03-24 (×10): qty 1

## 2023-03-24 MED ORDER — PANTOPRAZOLE SODIUM 40 MG PO TBEC
40.0000 mg | DELAYED_RELEASE_TABLET | Freq: Every day | ORAL | Status: DC
  Administered 2023-03-24 – 2023-04-02 (×10): 40 mg via ORAL
  Filled 2023-03-24 (×10): qty 1

## 2023-03-24 MED ORDER — BUSPIRONE HCL 10 MG PO TABS
5.0000 mg | ORAL_TABLET | Freq: Every day | ORAL | Status: DC
  Administered 2023-03-24 – 2023-04-02 (×10): 5 mg via ORAL
  Filled 2023-03-24 (×11): qty 1

## 2023-03-24 NOTE — ED Notes (Addendum)
Pt asked to speak to her daughter Pamala Duffel. This tech handed pt the phone and helped pt call her daughter.

## 2023-03-24 NOTE — ED Notes (Signed)
Recollected BMP for lab.

## 2023-03-24 NOTE — ED Notes (Signed)
Meds given. Swab obtained. Lab asked again to send phlebotomy to obtain labs for patient. VSS, CCM in use, call light within reach.

## 2023-03-24 NOTE — ED Notes (Signed)
Lab called to send phleb to obtain BMP as this RN has sent 2 down now off of IV without tourniquet that have hemolyzed repeatedly now. Stated they would send someone.

## 2023-03-24 NOTE — ED Notes (Signed)
Transported to CT 

## 2023-03-24 NOTE — ED Notes (Signed)
Phlebotomy never showed for this patient. [place] IV placed to obtain labs and sent down.

## 2023-03-24 NOTE — Progress Notes (Addendum)
Progress Note    Anita Walker  LKG:[id number] DOB: December 15, 1940  DOA: 03/23/2023 PCP: Dale Adams, MD      Brief Narrative:    Medical records reviewed and are as summarized below:  Anita Walker is a 83 y.o. female with medical history significant of hypertension, hyperlipidemia, diet-controlled diabetes, PE not on anticoagulants due to fall, anxiety, gastric ulcer, GERD, anemia, breast cancer (s/p of left lumpectomy, chemo and radiation therapy), who presented to the ED because of hallucinations, general weakness, cough and wheezing.  Reportedly, she fell accidentally 3 days prior to admission.        Assessment/Plan:   Principal Problem:   Hyponatremia Active Problems:   Bilateral leg edema   Acute bronchitis   Hypertension   Hypercholesteremia   Type 2 diabetes mellitus with neurological complications (HCC)   Mixed emotional features as adjustment reaction   Anxiety   Hallucination   Obesity (BMI 30-39.9)    Body mass index is 37.21 kg/m.  (Obesity)   Acute on chronic hyponatremia: Sodium level is stable.   Cough, wheezing, suspected acute bronchitis: Respiratory viral panel was negative.  Chest x-ray was unremarkable.  CT chest without contrast has been ordered for further evaluation.  2D echo is pending. Continue bronchodilators as needed   Bilateral leg edema: Venous duplex of lower extremity was negative.  2D echo is pending.  BNP 147.2 Restart IV Lasix.   Hallucinations: No acute issues at this time.  Exact etiology is unclear.  MRI brain did not show any evidence of acute stroke.  Continue to monitor. Vitamin B12 1,032   Comorbidities include hypertension, hypercholesterolemia, type II DM (recent A1c was 6), anxiety, history of atrial fibrillation and pulmonary embolism not on anticoagulation because of high fall risk, remote history of breast cancer s/p lumpectomy and chemoradiation in 1992       Diet Order             Diet  regular Fluid consistency: Thin; Fluid restriction: 1200 mL Fluid  Diet effective now                            Consultants: None  Procedures: None    Medications:    amLODipine  2.5 mg Oral Daily   aspirin EC  81 mg Oral Daily   busPIRone  5 mg Oral Q1400   enoxaparin (LOVENOX) injection  0.5 mg/kg Subcutaneous Q24H   ferrous sulfate  325 mg Oral QODAY   ipratropium-albuterol  3 mL Nebulization Q4H   methylPREDNISolone (SOLU-MEDROL) injection  40 mg Intravenous Q12H   multivitamin with minerals  1 tablet Oral Daily   pantoprazole  40 mg Oral Daily   simvastatin  10 mg Oral Daily   Continuous Infusions:   Anti-infectives (From admission, onward)    None              Family Communication/Anticipated D/C date and plan/Code Status   DVT prophylaxis:      Code Status: Limited: Do not attempt resuscitation (DNR) -DNR-LIMITED -Do Not Intubate/DNI   Family Communication: Plan discussed with Mardene Celeste, daughter, over the phone Disposition Plan: Plan to discharge home   Status is: Inpatient Remains inpatient appropriate because: Workup in progress, possible CHF       Subjective:   Interval events noted.  She is confused and unable to provide any history.  Nash Dimmer, RN, was at the bedside  Objective:  Vitals:   03/24/23 0619 03/24/23 0630 03/24/23 0700 03/24/23 0800  BP: (!) 141/101 (!) 142/84 (!) 146/73 105/73  Pulse: 73 84 77 82  Resp: 16 19 17 17   Temp:      TempSrc:      SpO2: 95% 98% 96% 93%  Weight:      Height:       No data found.   Intake/Output Summary (Last 24 hours) at 03/24/2023 0905 Last data filed at 03/24/2023 0449 Gross per 24 hour  Intake --  Output 550 ml  Net -550 ml   Filed Weights   03/24/23 0307  Weight: 114.3 kg    Exam:   GEN: NAD SKIN: Warm and dry EYES: No pallor or icterus ENT: MMM, hard of hearing CV: RRR, systolic murmur loudest at the left lower sternal border PULM: B/l wheezing ABD:  soft, obese, NT, +BS CNS: AAO x 2 (person and place), non focal EXT: B/l leg edema and tenderness        Data Reviewed:   I have personally reviewed following labs and imaging studies:  Labs: Labs show the following:   Basic Metabolic Panel: Recent Labs  Lab 03/19/23 1100 03/23/23 1230 03/24/23 0559 03/24/23 0822  NA 129* 127* 129* 130*  K 4.9 4.4 4.1 3.9  CL 93* 94* 96* 95*  CO2 29 23 26 24   GLUCOSE 108* 141* 127* 157*  BUN 15 12 12 11   CREATININE 0.57 0.62 0.58 0.61  CALCIUM 9.5 9.2 9.4 9.3   GFR Estimated Creatinine Clearance: 69.3 mL/min (by C-G formula based on SCr of 0.61 mg/dL). Liver Function Tests: Recent Labs  Lab 03/19/23 1100  AST 19  ALT 19  ALKPHOS 71  BILITOT 0.4  PROT 6.6  ALBUMIN 4.2   No results for input(s): "LIPASE", "AMYLASE" in the last 168 hours. No results for input(s): "AMMONIA" in the last 168 hours. Coagulation profile No results for input(s): "INR", "PROTIME" in the last 168 hours.  CBC: Recent Labs  Lab 03/23/23 1230 03/24/23 0742  WBC 8.8 3.5*  HGB 13.8 14.0  HCT 40.0 40.9  MCV 87.5 88.0  PLT 155 178   Cardiac Enzymes: No results for input(s): "CKTOTAL", "CKMB", "CKMBINDEX", "TROPONINI" in the last 168 hours. BNP (last 3 results) No results for input(s): "PROBNP" in the last 8760 hours. CBG: Recent Labs  Lab 03/24/23 0749  GLUCAP 129*   D-Dimer: No results for input(s): "DDIMER" in the last 72 hours. Hgb A1c: No results for input(s): "HGBA1C" in the last 72 hours. Lipid Profile: No results for input(s): "CHOL", "HDL", "LDLCALC", "TRIG", "CHOLHDL", "LDLDIRECT" in the last 72 hours. Thyroid function studies: No results for input(s): "TSH", "T4TOTAL", "T3FREE", "THYROIDAB" in the last 72 hours.  Invalid input(s): "FREET3" Anemia work up: Recent Labs    03/24/23 0559  VITAMINB12 1,032*   Sepsis Labs: Recent Labs  Lab 03/23/23 1230 03/24/23 0742  WBC 8.8 3.5*    Microbiology Recent Results (from  the past 240 hours)  Resp panel by RT-PCR (RSV, Flu A&B, Covid) Anterior Nasal Swab     Status: None   Collection Time: 03/23/23  7:31 PM   Specimen: Anterior Nasal Swab  Result Value Ref Range Status   SARS Coronavirus 2 by RT PCR NEGATIVE NEGATIVE Final    Comment: (NOTE) SARS-CoV-2 target nucleic acids are NOT DETECTED.  The SARS-CoV-2 RNA is generally detectable in upper respiratory specimens during the acute phase of infection. The lowest concentration of SARS-CoV-2 viral copies this assay can  detect is 138 copies/mL. A negative result does not preclude SARS-Cov-2 infection and should not be used as the sole basis for treatment or other patient management decisions. A negative result may occur with  improper specimen collection/handling, submission of specimen other than nasopharyngeal swab, presence of viral mutation(s) within the areas targeted by this assay, and inadequate number of viral copies(<138 copies/mL). A negative result must be combined with clinical observations, patient history, and epidemiological information. The expected result is Negative.  Fact Sheet for Patients:  BloggerCourse.com  Fact Sheet for Healthcare Providers:  SeriousBroker.it  This test is no t yet approved or cleared by the [place] FDA and  has been authorized for detection and/or diagnosis of SARS-CoV-2 by FDA under an Emergency Use Authorization (EUA). This EUA will remain  in effect (meaning this test can be used) for the duration of the COVID-19 declaration under Section 564(b)(1) of the Act, 21 U.S.C.section 360bbb-3(b)(1), unless the authorization is terminated  or revoked sooner.       Influenza A by PCR NEGATIVE NEGATIVE Final   Influenza B by PCR NEGATIVE NEGATIVE Final    Comment: (NOTE) The Xpert Xpress SARS-CoV-2/FLU/RSV plus assay is intended as an aid in the diagnosis of influenza from Nasopharyngeal swab specimens  and should not be used as a sole basis for treatment. Nasal washings and aspirates are unacceptable for Xpert Xpress SARS-CoV-2/FLU/RSV testing.  Fact Sheet for Patients: BloggerCourse.com  Fact Sheet for Healthcare Providers: SeriousBroker.it  This test is not yet approved or cleared by the [place] FDA and has been authorized for detection and/or diagnosis of SARS-CoV-2 by FDA under an Emergency Use Authorization (EUA). This EUA will remain in effect (meaning this test can be used) for the duration of the COVID-19 declaration under Section 564(b)(1) of the Act, 21 U.S.C. section 360bbb-3(b)(1), unless the authorization is terminated or revoked.     Resp Syncytial Virus by PCR NEGATIVE NEGATIVE Final    Comment: (NOTE) Fact Sheet for Patients: BloggerCourse.com  Fact Sheet for Healthcare Providers: SeriousBroker.it  This test is not yet approved or cleared by the [place] FDA and has been authorized for detection and/or diagnosis of SARS-CoV-2 by FDA under an Emergency Use Authorization (EUA). This EUA will remain in effect (meaning this test can be used) for the duration of the COVID-19 declaration under Section 564(b)(1) of the Act, 21 U.S.C. section 360bbb-3(b)(1), unless the authorization is terminated or revoked.  Performed at [facility], [address] [place], [place] [address]   Respiratory (~20 pathogens) panel by PCR     Status: None   Collection Time: 03/24/23  4:50 AM   Specimen: Nasopharyngeal Swab; Respiratory  Result Value Ref Range Status   Adenovirus NOT DETECTED NOT DETECTED Final   Coronavirus 229E NOT DETECTED NOT DETECTED Final    Comment: (NOTE) The Coronavirus on the Respiratory Panel, DOES NOT test for the novel  Coronavirus (2019 nCoV)    Coronavirus HKU1 NOT DETECTED NOT DETECTED Final   Coronavirus NL63 NOT DETECTED NOT  DETECTED Final   Coronavirus OC43 NOT DETECTED NOT DETECTED Final   Metapneumovirus NOT DETECTED NOT DETECTED Final   Rhinovirus / Enterovirus NOT DETECTED NOT DETECTED Final   Influenza A NOT DETECTED NOT DETECTED Final   Influenza B NOT DETECTED NOT DETECTED Final   Parainfluenza Virus 1 NOT DETECTED NOT DETECTED Final   Parainfluenza Virus 2 NOT DETECTED NOT DETECTED Final   Parainfluenza Virus 3 NOT DETECTED NOT DETECTED Final   Parainfluenza  Virus 4 NOT DETECTED NOT DETECTED Final   Respiratory Syncytial Virus NOT DETECTED NOT DETECTED Final   Bordetella pertussis NOT DETECTED NOT DETECTED Final   Bordetella Parapertussis NOT DETECTED NOT DETECTED Final   Chlamydophila pneumoniae NOT DETECTED NOT DETECTED Final   Mycoplasma pneumoniae NOT DETECTED NOT DETECTED Final    Comment: Performed at [facility], [address] [place], [place] [address]    Procedures and diagnostic studies:  US Venous Img Lower Bilateral (DVT) Result Date: 03/24/2023 CLINICAL DATA:  Lower extremity edema EXAM: BILATERAL LOWER EXTREMITY VENOUS DOPPLER ULTRASOUND TECHNIQUE: Gray-scale sonography with graded compression, as well as color Doppler and duplex ultrasound were performed to evaluate the lower extremity deep venous systems from the level of the common femoral vein and including the common femoral, femoral, profunda femoral, popliteal and calf veins including the posterior tibial, peroneal and gastrocnemius veins when visible. The superficial great saphenous vein was also interrogated. Spectral Doppler was utilized to evaluate flow at rest and with distal augmentation maneuvers in the common femoral, femoral and popliteal veins. COMPARISON:  None Available. FINDINGS: RIGHT LOWER EXTREMITY Common Femoral Vein: No evidence of thrombus. Normal compressibility, respiratory phasicity and response to augmentation. Saphenofemoral Junction: No evidence of thrombus. Normal compressibility and flow on color  Doppler imaging. Profunda Femoral Vein: No evidence of thrombus. Normal compressibility and flow on color Doppler imaging. Femoral Vein: No evidence of thrombus. Normal compressibility, respiratory phasicity and response to augmentation. Popliteal Vein: No evidence of thrombus. Normal compressibility, respiratory phasicity and response to augmentation. Calf Veins: No evidence of thrombus. Normal compressibility and flow on color Doppler imaging. Superficial Great Saphenous Vein: No evidence of thrombus. Normal compressibility. Venous Reflux:  None. Other Findings:  None. LEFT LOWER EXTREMITY Common Femoral Vein: No evidence of thrombus. Normal compressibility, respiratory phasicity and response to augmentation. Saphenofemoral Junction: No evidence of thrombus. Normal compressibility and flow on color Doppler imaging. Profunda Femoral Vein: No evidence of thrombus. Normal compressibility and flow on color Doppler imaging. Femoral Vein: No evidence of thrombus. Normal compressibility, respiratory phasicity and response to augmentation. Popliteal Vein: No evidence of thrombus. Normal compressibility, respiratory phasicity and response to augmentation. Calf Veins: No evidence of thrombus. Normal compressibility and flow on color Doppler imaging. Superficial Great Saphenous Vein: No evidence of thrombus. Normal compressibility. Venous Reflux:  None. Other Findings:  None. IMPRESSION: No evidence of deep venous thrombosis in either lower extremity. Electronically Signed   By: Malachy Moan M.D.   On: 03/24/2023 07:16   MR BRAIN WO CONTRAST Result Date: 03/23/2023 CLINICAL DATA:  Hallucinations EXAM: MRI HEAD WITHOUT CONTRAST TECHNIQUE: Multiplanar, multiecho pulse sequences of the brain and surrounding structures were obtained without intravenous contrast. COMPARISON:  None Available. FINDINGS: Brain: No acute infarct, mass effect or extra-axial collection. Chronic microhemorrhage in the left occipital lobe. There  is confluent hyperintense T2-weighted signal within the white matter. Generalized volume loss. The midline structures are normal. Vascular: Normal flow voids. Skull and upper cervical spine: Normal calvarium and skull base. Visualized upper cervical spine and soft tissues are normal. Sinuses/Orbits:No paranasal sinus fluid levels or advanced mucosal thickening. No mastoid or middle ear effusion. Normal orbits. IMPRESSION: 1. No acute intracranial abnormality. 2. Findings of chronic small vessel ischemia and volume loss. Electronically Signed   By: Deatra Robinson M.D.   On: 03/23/2023 23:15   DG Chest Portable 1 View Result Date: 03/23/2023 CLINICAL DATA:  Hypoxia EXAM: PORTABLE CHEST 1 VIEW COMPARISON:  03/19/2023 FINDINGS: Surgical clips in the  left axilla. No acute airspace disease, pleural effusion or pneumothorax. Stable cardiomediastinal silhouette with moderate to large hiatal hernia. Aortic atherosclerosis IMPRESSION: No active disease. Moderate to large hiatal hernia. Electronically Signed   By: Jasmine Pang M.D.   On: 03/23/2023 19:47   CT Head Wo Contrast Result Date: 03/23/2023 CLINICAL DATA:  Altered mental status after a fall.  Hallucinations. EXAM: CT HEAD WITHOUT CONTRAST TECHNIQUE: Contiguous axial images were obtained from the base of the skull through the vertex without intravenous contrast. RADIATION DOSE REDUCTION: This exam was performed according to the departmental dose-optimization program which includes automated exposure control, adjustment of the mA and/or kV according to patient size and/or use of iterative reconstruction technique. COMPARISON:  12/13/2022 FINDINGS: Brain: Generalized age related volume loss. Chronic small-vessel ischemic changes of the white matter. No sign of acute infarction, mass lesion, hemorrhage, hydrocephalus or extra-axial collection. Vascular: There is atherosclerotic calcification of the major vessels at the base of the brain. Skull: Negative  Sinuses/Orbits: Clear/normal Other: None IMPRESSION: No acute CT finding. Age related volume loss. Chronic small-vessel ischemic changes of the white matter. Electronically Signed   By: Paulina Fusi M.D.   On: 03/23/2023 18:55               LOS: 0 days   Keval Nam  [facility]   Pager on [email or web address]. If 7PM-7AM, please contact night-coverage at [email or web address]     03/24/2023, 9:05 AM

## 2023-03-24 NOTE — Progress Notes (Signed)
*  PRELIMINARY RESULTS* Echocardiogram 2D Echocardiogram has been performed.  Anita Walker 03/24/2023, 2:40 PM

## 2023-03-24 NOTE — ED Notes (Signed)
Meds given. Repeat labs obtained. Patient placed onto purewick for comfort due to IV lasix and urinary incontinence.

## 2023-03-24 NOTE — ED Notes (Signed)
Meds given. Labs drawn. Patient back to resting quietly in ER stretcher. MD at bedside to evaluate patient.

## 2023-03-24 NOTE — ED Notes (Signed)
IVF bolus started due to low Bps, most likely due to earlier ativan administration. VSS, CCM in use, call light within reach. Family at bedside.

## 2023-03-25 ENCOUNTER — Encounter: Payer: Self-pay | Admitting: Internal Medicine

## 2023-03-25 DIAGNOSIS — M7989 Other specified soft tissue disorders: Secondary | ICD-10-CM | POA: Insufficient documentation

## 2023-03-25 DIAGNOSIS — R600 Localized edema: Secondary | ICD-10-CM | POA: Diagnosis not present

## 2023-03-25 DIAGNOSIS — I5033 Acute on chronic diastolic (congestive) heart failure: Secondary | ICD-10-CM | POA: Diagnosis present

## 2023-03-25 DIAGNOSIS — E871 Hypo-osmolality and hyponatremia: Secondary | ICD-10-CM | POA: Diagnosis not present

## 2023-03-25 LAB — BASIC METABOLIC PANEL
Anion gap: 11 (ref 5–15)
BUN: 21 mg/dL (ref 8–23)
CO2: 23 mmol/L (ref 22–32)
Calcium: 9.6 mg/dL (ref 8.9–10.3)
Chloride: 95 mmol/L — ABNORMAL LOW (ref 98–111)
Creatinine, Ser: 0.68 mg/dL (ref 0.44–1.00)
GFR, Estimated: >60 mL/min (ref >60)
Glucose, Bld: 137 mg/dL — ABNORMAL HIGH (ref 70–99)
Potassium: 4.6 mmol/L (ref 3.5–5.1)
Sodium: 129 mmol/L — ABNORMAL LOW (ref 135–145)

## 2023-03-25 LAB — CBG MONITORING, ED: Glucose-Capillary: 198 mg/dL — ABNORMAL HIGH (ref 70–99)

## 2023-03-25 LAB — MAGNESIUM: Magnesium: 1.9 mg/dL (ref 1.7–2.4)

## 2023-03-25 MED ORDER — PREDNISONE 20 MG PO TABS
20.0000 mg | ORAL_TABLET | Freq: Every day | ORAL | Status: AC
  Administered 2023-03-26 – 2023-03-27 (×2): 20 mg via ORAL
  Filled 2023-03-25 (×2): qty 1

## 2023-03-25 MED ORDER — FUROSEMIDE 10 MG/ML IJ SOLN
60.0000 mg | Freq: Two times a day (BID) | INTRAMUSCULAR | Status: DC
Start: 2023-03-25 — End: 2023-03-26
  Administered 2023-03-25 – 2023-03-26 (×3): 60 mg via INTRAVENOUS
  Filled 2023-03-25 (×3): qty 8

## 2023-03-25 NOTE — ED Notes (Signed)
Hospitalist at bedside 

## 2023-03-25 NOTE — Assessment & Plan Note (Signed)
Low carb diet and exercise as tolerated.  Follow met b and a1c.

## 2023-03-25 NOTE — Assessment & Plan Note (Signed)
On simvastatin.  Low cholesterol diet and exercise.  Follow lipid panel and liver function tests.   

## 2023-03-25 NOTE — Assessment & Plan Note (Signed)
Continue losartan and amlodipine.  Blood pressure as outlined. .  Follow pressures.  Follow metabolic panel.  

## 2023-03-25 NOTE — ED Notes (Signed)
No thermometer in room. Looking for thermometer for overdue temp.

## 2023-03-25 NOTE — Assessment & Plan Note (Signed)
Continue losartan and amlodipine.  Follow pressures. Follow metabolic panel.   Lab Results  Component Value Date   CREATININE 0.68 03/25/2023

## 2023-03-25 NOTE — Assessment & Plan Note (Signed)
Has had issues with increased anxiety. Some increased irritability at times. On buspar. Hold adjusting medications today. Recheck sodium. See if can improved sodium level. See if symptoms improve.

## 2023-03-25 NOTE — Progress Notes (Addendum)
Progress Note    Anita Walker  RUE:[id number] DOB: [birth year]  DOA: 03/23/2023 PCP: Dale Concord, MD      Brief Narrative:    Medical records reviewed and are as summarized below:  Anita Walker is a 83 y.o. female with medical history significant of hypertension, hyperlipidemia, diet-controlled diabetes, PE not on anticoagulants due to fall, anxiety, gastric ulcer, GERD, anemia, breast cancer (s/p of left lumpectomy, chemo and radiation therapy), who presented to the ED because of hallucinations, general weakness, cough and wheezing.  Reportedly, she fell accidentally 3 days prior to admission.        Assessment/Plan:   Principal Problem:   Hyponatremia Active Problems:   Bilateral leg edema   Acute bronchitis   Hypertension   Hypercholesteremia   Type 2 diabetes mellitus with neurological complications (HCC)   Mixed emotional features as adjustment reaction   Anxiety   Hallucination   Obesity (BMI 30-39.9)   Acute on chronic heart failure with preserved ejection fraction (HFpEF) (HCC)    Body mass index is 37.21 kg/m.  (Obesity)   Acute on chronic hyponatremia: Sodium level is fluctuating but stable.   Cough, wheezing, suspected acute bronchitis Acute on chronic diastolic CHF: Continue IV Lasix.  Monitor BMP, urine output and daily weight. Respiratory viral panel was negative.  CT chest showed findings suspicious for pulmonary edema. 2D echo showed EF estimated at 50 to 55%, mild concentric LVH, indeterminate LV Parameters, mild MR, mild to moderate TR BNP 147.2   Bilateral leg edema: Venous duplex of lower extremity was negative.   Continue IV Lasix   Hallucinations: No acute issues at this time.  Exact etiology is unclear.  MRI brain did not show any evidence of acute stroke.   Vitamin B12 1,032   1.5 cm right thyroid nodule: Outpatient follow-up with thyroid ultrasound   Comorbidities include hypertension, hypercholesterolemia, type  II DM (recent A1c was 6), anxiety, history of atrial fibrillation and pulmonary embolism not on anticoagulation because of high fall risk, remote history of breast cancer s/p lumpectomy and chemoradiation in 1992       Diet Order             Diet regular Fluid consistency: Thin; Fluid restriction: 1200 mL Fluid  Diet effective now                            Consultants: None  Procedures: None    Medications:    amLODipine  2.5 mg Oral Daily   aspirin EC  81 mg Oral Daily   busPIRone  5 mg Oral Q1400   enoxaparin (LOVENOX) injection  0.5 mg/kg Subcutaneous Q24H   ferrous sulfate  325 mg Oral QODAY   furosemide  60 mg Intravenous BID   ipratropium-albuterol  3 mL Nebulization Q4H   multivitamin with minerals  1 tablet Oral Daily   pantoprazole  40 mg Oral Daily   [START ON 03/26/2023] predniSONE  20 mg Oral Q breakfast   simvastatin  10 mg Oral Daily   Continuous Infusions:   Anti-infectives (From admission, onward)    None              Family Communication/Anticipated D/C date and plan/Code Status   DVT prophylaxis:      Code Status: Limited: Do not attempt resuscitation (DNR) -DNR-LIMITED -Do Not Intubate/DNI   Family Communication: None Disposition Plan: Plan to discharge home   Status  is: Inpatient Remains inpatient appropriate because: Workup in progress, possible CHF       Subjective:   Interval events noted.  No new complaints.  She still has a cough.  Breathing is okay.  Student nurse was at the bedside  Objective:    Vitals:   03/25/23 1000 03/25/23 1130 03/25/23 1300 03/25/23 1333  BP: 112/60 (!) 111/50 (!) 108/50 (!) 96/47  Pulse: (!) 104 94 99 91  Resp: 20  (!) 21 (!) 24  Temp:    97.7 F (36.5 C)  TempSrc:    Oral  SpO2: 93% 93% 91% 91%  Weight:      Height:       No data found.   Intake/Output Summary (Last 24 hours) at 03/25/2023 1457 Last data filed at 03/25/2023 1315 Gross per 24 hour  Intake --   Output 2000 ml  Net -2000 ml   Filed Weights   03/24/23 0307  Weight: 114.3 kg    Exam:  GEN: NAD SKIN: Warm and dry EYES: No pallor or icterus ENT: MMM, very hard of hearing CV: RRR PULM: Bilateral wheezing, mild bibasilar rales ABD: soft, ND, NT, +BS CNS: AAO x 3, non focal EXT: Bilateral leg edema and tenderness       Data Reviewed:   I have personally reviewed following labs and imaging studies:  Labs: Labs show the following:   Basic Metabolic Panel: Recent Labs  Lab 03/19/23 1100 03/23/23 1230 03/24/23 0559 03/24/23 0822 03/25/23 0521  NA 129* 127* 129* 130* 129*  K 4.9 4.4 4.1 3.9 4.6  CL 93* 94* 96* 95* 95*  CO2 29 23 26 24 23   GLUCOSE 108* 141* 127* 157* 137*  BUN 15 12 12 11 21   CREATININE 0.57 0.62 0.58 0.61 0.68  CALCIUM 9.5 9.2 9.4 9.3 9.6  MG  --   --   --   --  1.9   GFR Estimated Creatinine Clearance: 69.3 mL/min (by C-G formula based on SCr of 0.68 mg/dL). Liver Function Tests: Recent Labs  Lab 03/19/23 1100  AST 19  ALT 19  ALKPHOS 71  BILITOT 0.4  PROT 6.6  ALBUMIN 4.2   No results for input(s): "LIPASE", "AMYLASE" in the last 168 hours. No results for input(s): "AMMONIA" in the last 168 hours. Coagulation profile No results for input(s): "INR", "PROTIME" in the last 168 hours.  CBC: Recent Labs  Lab 03/23/23 1230 03/24/23 0742  WBC 8.8 3.5*  HGB 13.8 14.0  HCT 40.0 40.9  MCV 87.5 88.0  PLT 155 178   Cardiac Enzymes: No results for input(s): "CKTOTAL", "CKMB", "CKMBINDEX", "TROPONINI" in the last 168 hours. BNP (last 3 results) No results for input(s): "PROBNP" in the last 8760 hours. CBG: Recent Labs  Lab 03/24/23 0749 03/25/23 0838  GLUCAP 129* 198*   D-Dimer: No results for input(s): "DDIMER" in the last 72 hours. Hgb A1c: No results for input(s): "HGBA1C" in the last 72 hours. Lipid Profile: No results for input(s): "CHOL", "HDL", "LDLCALC", "TRIG", "CHOLHDL", "LDLDIRECT" in the last 72  hours. Thyroid function studies: No results for input(s): "TSH", "T4TOTAL", "T3FREE", "THYROIDAB" in the last 72 hours.  Invalid input(s): "FREET3" Anemia work up: Recent Labs    03/24/23 0559  VITAMINB12 1,032*   Sepsis Labs: Recent Labs  Lab 03/23/23 1230 03/24/23 0742  WBC 8.8 3.5*    Microbiology Recent Results (from the past 240 hours)  Resp panel by RT-PCR (RSV, Flu A&B, Covid) Anterior Nasal Swab  Status: None   Collection Time: 03/23/23  7:31 PM   Specimen: Anterior Nasal Swab  Result Value Ref Range Status   SARS Coronavirus 2 by RT PCR NEGATIVE NEGATIVE Final    Comment: (NOTE) SARS-CoV-2 target nucleic acids are NOT DETECTED.  The SARS-CoV-2 RNA is generally detectable in upper respiratory specimens during the acute phase of infection. The lowest concentration of SARS-CoV-2 viral copies this assay can detect is 138 copies/mL. A negative result does not preclude SARS-Cov-2 infection and should not be used as the sole basis for treatment or other patient management decisions. A negative result may occur with  improper specimen collection/handling, submission of specimen other than nasopharyngeal swab, presence of viral mutation(s) within the areas targeted by this assay, and inadequate number of viral copies(<138 copies/mL). A negative result must be combined with clinical observations, patient history, and epidemiological information. The expected result is Negative.  Fact Sheet for Patients:  BloggerCourse.com  Fact Sheet for Healthcare Providers:  SeriousBroker.it  This test is no t yet approved or cleared by the [place] FDA and  has been authorized for detection and/or diagnosis of SARS-CoV-2 by FDA under an Emergency Use Authorization (EUA). This EUA will remain  in effect (meaning this test can be used) for the duration of the COVID-19 declaration under Section 564(b)(1) of the Act,  21 U.S.C.section 360bbb-3(b)(1), unless the authorization is terminated  or revoked sooner.       Influenza A by PCR NEGATIVE NEGATIVE Final   Influenza B by PCR NEGATIVE NEGATIVE Final    Comment: (NOTE) The Xpert Xpress SARS-CoV-2/FLU/RSV plus assay is intended as an aid in the diagnosis of influenza from Nasopharyngeal swab specimens and should not be used as a sole basis for treatment. Nasal washings and aspirates are unacceptable for Xpert Xpress SARS-CoV-2/FLU/RSV testing.  Fact Sheet for Patients: BloggerCourse.com  Fact Sheet for Healthcare Providers: SeriousBroker.it  This test is not yet approved or cleared by the [place] FDA and has been authorized for detection and/or diagnosis of SARS-CoV-2 by FDA under an Emergency Use Authorization (EUA). This EUA will remain in effect (meaning this test can be used) for the duration of the COVID-19 declaration under Section 564(b)(1) of the Act, 21 U.S.C. section 360bbb-3(b)(1), unless the authorization is terminated or revoked.     Resp Syncytial Virus by PCR NEGATIVE NEGATIVE Final    Comment: (NOTE) Fact Sheet for Patients: BloggerCourse.com  Fact Sheet for Healthcare Providers: SeriousBroker.it  This test is not yet approved or cleared by the [place] FDA and has been authorized for detection and/or diagnosis of SARS-CoV-2 by FDA under an Emergency Use Authorization (EUA). This EUA will remain in effect (meaning this test can be used) for the duration of the COVID-19 declaration under Section 564(b)(1) of the Act, 21 U.S.C. section 360bbb-3(b)(1), unless the authorization is terminated or revoked.  Performed at [facility], [address] [place], [place] [address]   Respiratory (~20 pathogens) panel by PCR     Status: None   Collection Time: 03/24/23  4:50 AM   Specimen: Nasopharyngeal Swab;  Respiratory  Result Value Ref Range Status   Adenovirus NOT DETECTED NOT DETECTED Final   Coronavirus 229E NOT DETECTED NOT DETECTED Final    Comment: (NOTE) The Coronavirus on the Respiratory Panel, DOES NOT test for the novel  Coronavirus (2019 nCoV)    Coronavirus HKU1 NOT DETECTED NOT DETECTED Final   Coronavirus NL63 NOT DETECTED NOT DETECTED Final   Coronavirus OC43 NOT DETECTED  NOT DETECTED Final   Metapneumovirus NOT DETECTED NOT DETECTED Final   Rhinovirus / Enterovirus NOT DETECTED NOT DETECTED Final   Influenza A NOT DETECTED NOT DETECTED Final   Influenza B NOT DETECTED NOT DETECTED Final   Parainfluenza Virus 1 NOT DETECTED NOT DETECTED Final   Parainfluenza Virus 2 NOT DETECTED NOT DETECTED Final   Parainfluenza Virus 3 NOT DETECTED NOT DETECTED Final   Parainfluenza Virus 4 NOT DETECTED NOT DETECTED Final   Respiratory Syncytial Virus NOT DETECTED NOT DETECTED Final   Bordetella pertussis NOT DETECTED NOT DETECTED Final   Bordetella Parapertussis NOT DETECTED NOT DETECTED Final   Chlamydophila pneumoniae NOT DETECTED NOT DETECTED Final   Mycoplasma pneumoniae NOT DETECTED NOT DETECTED Final    Comment: Performed at [facility], [address] [place], [place] [address]    Procedures and diagnostic studies:  ECHOCARDIOGRAM COMPLETE Result Date: 03/24/2023    ECHOCARDIOGRAM REPORT   Patient Name:   Anita Walker Date of Exam: 03/24/2023 Medical Rec #:  [id number]       Height:       69.0 in Accession #:    [id number]      Weight:       252.0 lb Date of Birth:  Apr 25, 1940       BSA:          2.279 m Patient Age:    83 years        BP:           143/72 mmHg Patient Gender: F               HR:           98 bpm. Exam Location:  [facility] Procedure: 2D Echo, Cardiac Doppler and Color Doppler Indications:     CHF-Acute Diastolic I50.31  History:         Patient has no prior history of Echocardiogram examinations.                  Arrythmias:Atrial Fibrillation; Risk  Factors:Hypertension.  Sonographer:     Neysa Bonito Roar Referring Phys:  Wynona Neat NIU Diagnosing Phys: Armanda Magic MD IMPRESSIONS  1. Left ventricular ejection fraction, by estimation, is 50 to 55%. The left ventricle has low normal function. The left ventricle has no regional wall motion abnormalities. There is mild concentric left ventricular hypertrophy. Indeterminate diastolic filling due to E-A fusion. Elevated left ventricular end-diastolic pressure.  2. Right ventricular systolic function is normal. The right ventricular size is normal.  3. The mitral valve is degenerative. Mild mitral valve regurgitation. Moderate mitral annular calcification.  4. Tricuspid valve regurgitation is mild to moderate.  5. The aortic valve is tricuspid. There is moderate calcification of the aortic valve. There is moderate thickening of the aortic valve. Aortic valve regurgitation is not visualized. Aortic valve sclerosis/calcification is present, without any evidence of aortic stenosis. Aortic valve area, by VTI measures 1.68 cm. Aortic valve mean gradient measures 12.5 mmHg. Aortic valve Vmax measures 2.52 m/s.  6. The inferior vena cava is normal in size with greater than 50% respiratory variability, suggesting right atrial pressure of 3 mmHg.  7. Recommend repeat limited echo to assess MV gradients further as initial measurement of mean MVG of does not appear accurate and the valve leaflets are not restricted. FINDINGS  Left Ventricle: Left ventricular ejection fraction, by estimation, is 50 to 55%. The left ventricle has low normal function. The left ventricle has no regional  wall motion abnormalities. The left ventricular internal cavity size was normal in size. There is mild concentric left ventricular hypertrophy. Indeterminate diastolic filling due to E-A fusion. Elevated left ventricular end-diastolic pressure. Right Ventricle: The right ventricular size is normal. No increase in right ventricular wall  thickness. Right ventricular systolic function is normal. Left Atrium: Left atrial size was normal in size. Right Atrium: Right atrial size was normal in size. Pericardium: There is no evidence of pericardial effusion. Mitral Valve: The mitral valve is degenerative in appearance. Moderate mitral annular calcification. Mild mitral valve regurgitation. MV peak gradient, 17.1 mmHg. Tricuspid Valve: The tricuspid valve is normal in structure. Tricuspid valve regurgitation is mild to moderate. No evidence of tricuspid stenosis. Aortic Valve: The aortic valve is tricuspid. There is moderate calcification of the aortic valve. There is moderate thickening of the aortic valve. Aortic valve regurgitation is not visualized. Aortic valve sclerosis/calcification is present, without any  evidence of aortic stenosis. Aortic valve mean gradient measures 12.5 mmHg. Aortic valve peak gradient measures 25.4 mmHg. Aortic valve area, by VTI measures 1.68 cm. Pulmonic Valve: The pulmonic valve was normal in structure. Pulmonic valve regurgitation is not visualized. No evidence of pulmonic stenosis. Aorta: The aortic root is normal in size and structure. Venous: The inferior vena cava is normal in size with greater than 50% respiratory variability, suggesting right atrial pressure of 3 mmHg. IAS/Shunts: No atrial level shunt detected by color flow Doppler.  LEFT VENTRICLE PLAX 2D LVIDd:         4.00 cm     Diastology LVIDs:         3.20 cm     LV e' medial:    7.27 cm/s LV PW:         1.10 cm     LV E/e' medial:  16.8 LV IVS:        1.10 cm     LV e' lateral:   8.21 cm/s LVOT diam:     2.00 cm     LV E/e' lateral: 14.9 LV SV:         81 LV SV Index:   35 LVOT Area:     3.14 cm  LV Volumes (MOD) LV vol d, MOD A2C: 95.9 ml LV vol d, MOD A4C: 85.8 ml LV vol s, MOD A2C: 57.5 ml LV vol s, MOD A4C: 50.0 ml LV SV MOD A2C:     38.4 ml LV SV MOD A4C:     85.8 ml LV SV MOD BP:      39.2 ml RIGHT VENTRICLE RV Basal diam:  3.20 cm RV Mid diam:     3.00 cm RV S prime:     21.90 cm/s TAPSE (M-mode): 2.5 cm LEFT ATRIUM             Index        RIGHT ATRIUM           Index LA diam:        4.20 cm 1.84 cm/m   RA Area:     19.30 cm LA Vol (A2C):   89.6 ml 39.31 ml/m  RA Volume:   56.60 ml  24.83 ml/m LA Vol (A4C):   70.9 ml 31.11 ml/m LA Biplane Vol: 80.0 ml 35.10 ml/m  AORTIC VALVE                     PULMONIC VALVE AV Area (Vmax):    1.61 cm  PV Vmax:        1.14 m/s AV Area (Vmean):   1.41 cm      PV Peak grad:   5.2 mmHg AV Area (VTI):     1.68 cm      RVOT Peak grad: 2 mmHg AV Vmax:           252.00 cm/s AV Vmean:          163.000 cm/s AV VTI:            0.480 m AV Peak Grad:      25.4 mmHg AV Mean Grad:      12.5 mmHg LVOT Vmax:         129.00 cm/s LVOT Vmean:        72.900 cm/s LVOT VTI:          0.257 m LVOT/AV VTI ratio: 0.54  AORTA Ao Root diam: 2.30 cm Ao Asc diam:  2.60 cm MITRAL VALVE MV Area (PHT): 6.43 cm     SHUNTS MV Area VTI:   2.95 cm     Systemic VTI:  0.26 m MV Peak grad:  17.1 mmHg    Systemic Diam: 2.00 cm MV Vmax:       2.07 m/s MV Vmean:      146.0 cm/s MV Decel Time: 118 msec MV E velocity: 122.00 cm/s MV A velocity: 182.00 cm/s MV E/A ratio:  0.67 MV A Prime:    19.4 cm/s Armanda Magic MD Electronically signed by Armanda Magic MD Signature Date/Time: 03/24/2023/2:58:54 PM    Final    CT CHEST WO CONTRAST Result Date: 03/24/2023 CLINICAL DATA:  Cough, wheezing, and weakness. EXAM: CT CHEST WITHOUT CONTRAST TECHNIQUE: Multidetector CT imaging of the chest was performed following the standard protocol without IV contrast. RADIATION DOSE REDUCTION: This exam was performed according to the departmental dose-optimization program which includes automated exposure control, adjustment of the mA and/or kV according to patient size and/or use of iterative reconstruction technique. COMPARISON:  Chest radiograph dated 03/23/2023 and CT chest dated 05/02/2017. FINDINGS: Cardiovascular: The main pulmonary artery is enlarged, measuring 3.5  cm in diameter, likely reflecting pulmonary hypertension. Vascular calcifications are seen in the coronary arteries and aortic arch. Normal heart size. No pericardial effusion. Mediastinum/Nodes: No enlarged mediastinal or axillary lymph nodes. There is a 1.5 cm hypoattenuating nodule in the right thyroid lobe. The trachea and esophagus demonstrate no significant findings. Lungs/Pleura: There is mild left basilar atelectasis. Mild bilateral ground-glass opacities and areas of septal line thickening are noted. No pneumothorax or pleural effusion. Upper Abdomen: There is a large hiatal hernia, similar to prior exam. Layering density within the gallbladder may represent gallbladder sludge. Musculoskeletal: Degenerative changes are seen in the spine. There is a chronic appearing wedge deformity of T2 with 25-50% height loss. IMPRESSION: 1. Mild bilateral ground-glass opacities and areas of septal line thickening may reflect pulmonary edema. 2. Enlarged main pulmonary artery likely reflecting pulmonary hypertension. 3. There is a 1.5 cm hypoattenuating nodule in the right thyroid lobe. Recommend nonemergent thyroid US. (Ref: J Am Coll Radiol. 2015 Feb;12(2): 143-50). Aortic Atherosclerosis (ICD10-I70.0). Electronically Signed   By: Romona Curls M.D.   On: 03/24/2023 12:16   US Venous Img Lower Bilateral (DVT) Result Date: 03/24/2023 CLINICAL DATA:  Lower extremity edema EXAM: BILATERAL LOWER EXTREMITY VENOUS DOPPLER ULTRASOUND TECHNIQUE: Gray-scale sonography with graded compression, as well as color Doppler and duplex ultrasound were performed to evaluate the lower extremity deep venous systems from the level of  the common femoral vein and including the common femoral, femoral, profunda femoral, popliteal and calf veins including the posterior tibial, peroneal and gastrocnemius veins when visible. The superficial great saphenous vein was also interrogated. Spectral Doppler was utilized to evaluate flow at rest and  with distal augmentation maneuvers in the common femoral, femoral and popliteal veins. COMPARISON:  None Available. FINDINGS: RIGHT LOWER EXTREMITY Common Femoral Vein: No evidence of thrombus. Normal compressibility, respiratory phasicity and response to augmentation. Saphenofemoral Junction: No evidence of thrombus. Normal compressibility and flow on color Doppler imaging. Profunda Femoral Vein: No evidence of thrombus. Normal compressibility and flow on color Doppler imaging. Femoral Vein: No evidence of thrombus. Normal compressibility, respiratory phasicity and response to augmentation. Popliteal Vein: No evidence of thrombus. Normal compressibility, respiratory phasicity and response to augmentation. Calf Veins: No evidence of thrombus. Normal compressibility and flow on color Doppler imaging. Superficial Great Saphenous Vein: No evidence of thrombus. Normal compressibility. Venous Reflux:  None. Other Findings:  None. LEFT LOWER EXTREMITY Common Femoral Vein: No evidence of thrombus. Normal compressibility, respiratory phasicity and response to augmentation. Saphenofemoral Junction: No evidence of thrombus. Normal compressibility and flow on color Doppler imaging. Profunda Femoral Vein: No evidence of thrombus. Normal compressibility and flow on color Doppler imaging. Femoral Vein: No evidence of thrombus. Normal compressibility, respiratory phasicity and response to augmentation. Popliteal Vein: No evidence of thrombus. Normal compressibility, respiratory phasicity and response to augmentation. Calf Veins: No evidence of thrombus. Normal compressibility and flow on color Doppler imaging. Superficial Great Saphenous Vein: No evidence of thrombus. Normal compressibility. Venous Reflux:  None. Other Findings:  None. IMPRESSION: No evidence of deep venous thrombosis in either lower extremity. Electronically Signed   By: Malachy Moan M.D.   On: 03/24/2023 07:16   MR BRAIN WO CONTRAST Result Date:  03/23/2023 CLINICAL DATA:  Hallucinations EXAM: MRI HEAD WITHOUT CONTRAST TECHNIQUE: Multiplanar, multiecho pulse sequences of the brain and surrounding structures were obtained without intravenous contrast. COMPARISON:  None Available. FINDINGS: Brain: No acute infarct, mass effect or extra-axial collection. Chronic microhemorrhage in the left occipital lobe. There is confluent hyperintense T2-weighted signal within the white matter. Generalized volume loss. The midline structures are normal. Vascular: Normal flow voids. Skull and upper cervical spine: Normal calvarium and skull base. Visualized upper cervical spine and soft tissues are normal. Sinuses/Orbits:No paranasal sinus fluid levels or advanced mucosal thickening. No mastoid or middle ear effusion. Normal orbits. IMPRESSION: 1. No acute intracranial abnormality. 2. Findings of chronic small vessel ischemia and volume loss. Electronically Signed   By: Deatra Robinson M.D.   On: 03/23/2023 23:15   DG Chest Portable 1 View Result Date: 03/23/2023 CLINICAL DATA:  Hypoxia EXAM: PORTABLE CHEST 1 VIEW COMPARISON:  03/19/2023 FINDINGS: Surgical clips in the left axilla. No acute airspace disease, pleural effusion or pneumothorax. Stable cardiomediastinal silhouette with moderate to large hiatal hernia. Aortic atherosclerosis IMPRESSION: No active disease. Moderate to large hiatal hernia. Electronically Signed   By: Jasmine Pang M.D.   On: 03/23/2023 19:47   CT Head Wo Contrast Result Date: 03/23/2023 CLINICAL DATA:  Altered mental status after a fall.  Hallucinations. EXAM: CT HEAD WITHOUT CONTRAST TECHNIQUE: Contiguous axial images were obtained from the base of the skull through the vertex without intravenous contrast. RADIATION DOSE REDUCTION: This exam was performed according to the departmental dose-optimization program which includes automated exposure control, adjustment of the mA and/or kV according to patient size and/or use of iterative  reconstruction technique. COMPARISON:  12/13/2022 FINDINGS: Brain: Generalized  age related volume loss. Chronic small-vessel ischemic changes of the white matter. No sign of acute infarction, mass lesion, hemorrhage, hydrocephalus or extra-axial collection. Vascular: There is atherosclerotic calcification of the major vessels at the base of the brain. Skull: Negative Sinuses/Orbits: Clear/normal Other: None IMPRESSION: No acute CT finding. Age related volume loss. Chronic small-vessel ischemic changes of the white matter. Electronically Signed   By: Paulina Fusi M.D.   On: 03/23/2023 18:55               LOS: 1 day   Mekaela Azizi  [facility]   Pager on [email or web address]. If 7PM-7AM, please contact night-coverage at [email or web address]     03/25/2023, 2:57 PM

## 2023-03-25 NOTE — Assessment & Plan Note (Signed)
Sugars have been well controlled.  Continue low carb diet and exercise.  Not moving around as much.  PT has started working on strengthening and gait. Follow met b and a1c.  Lab Results  Component Value Date   HGBA1C 6.0 03/19/2023

## 2023-03-25 NOTE — Assessment & Plan Note (Addendum)
Lower extremity swelling - bilateral. Persistent. Consider trial of lasix as outlined. (Short course). Leg elevation when sitting. Discussed compression hose. Unable to get on daily. Discussed referral to [facility] for evaluation. Discussed possible wrapping, etc. Check echo.

## 2023-03-25 NOTE — Assessment & Plan Note (Addendum)
Discussed recent low sodium.  Discussed importance of eating regular meals.  Discussed fluis restriction. Also, concern regarding increased edema.  Recheck sodium today.  Also check urine sodium, urine osm/serum osm. Consider lasix - for a few days to see if helps. Follow.

## 2023-03-25 NOTE — Assessment & Plan Note (Signed)
Found on previous CT head.  Mild.  Stable.  

## 2023-03-26 ENCOUNTER — Encounter: Payer: Self-pay | Admitting: Internal Medicine

## 2023-03-26 DIAGNOSIS — I5033 Acute on chronic diastolic (congestive) heart failure: Secondary | ICD-10-CM | POA: Diagnosis not present

## 2023-03-26 DIAGNOSIS — E871 Hypo-osmolality and hyponatremia: Secondary | ICD-10-CM | POA: Diagnosis not present

## 2023-03-26 LAB — MAGNESIUM: Magnesium: 1.9 mg/dL (ref 1.7–2.4)

## 2023-03-26 LAB — BASIC METABOLIC PANEL
Anion gap: 10 (ref 5–15)
BUN: 34 mg/dL — ABNORMAL HIGH (ref 8–23)
CO2: 27 mmol/L (ref 22–32)
Calcium: 9.2 mg/dL (ref 8.9–10.3)
Chloride: 94 mmol/L — ABNORMAL LOW (ref 98–111)
Creatinine, Ser: 0.87 mg/dL (ref 0.44–1.00)
GFR, Estimated: >60 mL/min (ref >60)
Glucose, Bld: 121 mg/dL — ABNORMAL HIGH (ref 70–99)
Potassium: 3.4 mmol/L — ABNORMAL LOW (ref 3.5–5.1)
Sodium: 131 mmol/L — ABNORMAL LOW (ref 135–145)

## 2023-03-26 LAB — CBG MONITORING, ED: Glucose-Capillary: 134 mg/dL — ABNORMAL HIGH (ref 70–99)

## 2023-03-26 MED ORDER — POTASSIUM CHLORIDE CRYS ER 20 MEQ PO TBCR: 40.0000 meq | EXTENDED_RELEASE_TABLET | Freq: Once | ORAL | Status: DC

## 2023-03-26 NOTE — Progress Notes (Addendum)
Progress Note    Anita Walker  HQI:[id number] DOB: [birth year]  DOA: 03/23/2023 PCP: Dale Harvey, MD      Brief Narrative:    Medical records reviewed and are as summarized below:  Anita Walker is a 83 y.o. female with medical history significant of hypertension, hyperlipidemia, diet-controlled diabetes, PE not on anticoagulants due to fall, anxiety, gastric ulcer, GERD, anemia, breast cancer (s/p of left lumpectomy, chemo and radiation therapy), who presented to the ED because of hallucinations, general weakness, cough and wheezing.  Reportedly, she fell accidentally 3 days prior to admission.        Assessment/Plan:   Principal Problem:   Hyponatremia Active Problems:   Bilateral leg edema   Acute bronchitis   Hypertension   Hypercholesteremia   Type 2 diabetes mellitus with neurological complications (HCC)   Mixed emotional features as adjustment reaction   Anxiety   Hallucination   Obesity (BMI 30-39.9)   Acute on chronic heart failure with preserved ejection fraction (HFpEF) (HCC)    Body mass index is 37.21 kg/m.  (Obesity)   Acute on chronic hyponatremia: Sodium level is fluctuating but stable.   Cough, wheezing, suspected acute bronchitis Acute on chronic diastolic CHF: Continue IV Lasix.  Monitor BMP, urine output and daily weight. She has been treated with steroids as well. Respiratory viral panel was negative.  CT chest showed findings suspicious for pulmonary edema. 2D echo showed EF estimated at 50 to 55%, mild concentric LVH, indeterminate LV Parameters, mild MR, mild to moderate TR BNP 147.2   Bilateral leg edema: Venous duplex of lower extremity was negative.   Continue IV Lasix   Hallucinations: No acute issues at this time.  Exact etiology is unclear.  Probably from delirium from acute illness.  MRI brain did not show any evidence of acute stroke.   Vitamin B12 1,032   1.5 cm right thyroid nodule: Outpatient follow-up with  thyroid ultrasound   General Weakness: PT evaluation   Comorbidities include hypertension, hypercholesterolemia, type II DM (recent A1c was 6), anxiety, history of atrial fibrillation and pulmonary embolism not on anticoagulation because of high fall risk, remote history of breast cancer s/p lumpectomy and chemoradiation in 1992   Possible discharge to home tomorrow    Diet Order             Diet regular Fluid consistency: Thin; Fluid restriction: 1200 mL Fluid  Diet effective now                            Consultants: None  Procedures: None    Medications:    amLODipine  2.5 mg Oral Daily   aspirin EC  81 mg Oral Daily   busPIRone  5 mg Oral Q1400   enoxaparin (LOVENOX) injection  0.5 mg/kg Subcutaneous Q24H   ferrous sulfate  325 mg Oral QODAY   furosemide  60 mg Intravenous BID   ipratropium-albuterol  3 mL Nebulization Q4H   multivitamin with minerals  1 tablet Oral Daily   pantoprazole  40 mg Oral Daily   predniSONE  20 mg Oral Q breakfast   simvastatin  10 mg Oral Daily   Continuous Infusions:   Anti-infectives (From admission, onward)    None              Family Communication/Anticipated D/C date and plan/Code Status   DVT prophylaxis:      Code Status: Limited: Do  not attempt resuscitation (DNR) -DNR-LIMITED -Do Not Intubate/DNI   Family Communication: None Disposition Plan: Plan to discharge home   Status is: Inpatient Remains inpatient appropriate because: CHF exacerbation      Subjective:   Interval events noted.  She feels no complaints.  Breathing is better.  Cough has improved.  Objective:    Vitals:   03/26/23 0900 03/26/23 1200 03/26/23 1359 03/26/23 1400  BP: (!) 111/47 94/80  (!) 106/54  Pulse: 88 96  95  Resp: (!) 21 17  (!) 21  Temp:   (!) 97.5 F (36.4 C)   TempSrc:   Oral   SpO2: 99% 93%  94%  Weight:      Height:       No data found.   Intake/Output Summary (Last 24 hours) at  03/26/2023 1753 Last data filed at 03/26/2023 1357 Gross per 24 hour  Intake --  Output 1700 ml  Net -1700 ml   Filed Weights   03/24/23 0307  Weight: 114.3 kg    Exam:  GEN: NAD SKIN: Warm and dry EYES: No pallor or icterus ENT: MMM CV: RRR PULM: Bilateral wheezing.  No rales heard ABD: soft, ND, NT, +BS CNS: AAO x 3, non focal EXT: Bilateral leg edema is improving.  She also has changes suggestive of chronic venous insufficiency (leg and ankle swelling, varicose veins in the legs and feet, some chronic mild erythematous changes on the legs and feet)       Data Reviewed:   I have personally reviewed following labs and imaging studies:  Labs: Labs show the following:   Basic Metabolic Panel: Recent Labs  Lab 03/23/23 1230 03/24/23 0559 03/24/23 0822 03/25/23 0521 03/26/23 1018  NA 127* 129* 130* 129* 131*  K 4.4 4.1 3.9 4.6 3.4*  CL 94* 96* 95* 95* 94*  CO2 23 26 24 23 27   GLUCOSE 141* 127* 157* 137* 121*  BUN 12 12 11 21  34*  CREATININE 0.62 0.58 0.61 0.68 0.87  CALCIUM 9.2 9.4 9.3 9.6 9.2  MG  --   --   --  1.9 1.9   GFR Estimated Creatinine Clearance: 63.7 mL/min (by C-G formula based on SCr of 0.87 mg/dL). Liver Function Tests: No results for input(s): "AST", "ALT", "ALKPHOS", "BILITOT", "PROT", "ALBUMIN" in the last 168 hours.  No results for input(s): "LIPASE", "AMYLASE" in the last 168 hours. No results for input(s): "AMMONIA" in the last 168 hours. Coagulation profile No results for input(s): "INR", "PROTIME" in the last 168 hours.  CBC: Recent Labs  Lab 03/23/23 1230 03/24/23 0742  WBC 8.8 3.5*  HGB 13.8 14.0  HCT 40.0 40.9  MCV 87.5 88.0  PLT 155 178   Cardiac Enzymes: No results for input(s): "CKTOTAL", "CKMB", "CKMBINDEX", "TROPONINI" in the last 168 hours. BNP (last 3 results) No results for input(s): "PROBNP" in the last 8760 hours. CBG: Recent Labs  Lab 03/24/23 0749 03/25/23 0838 03/26/23 0809  GLUCAP 129* 198* 134*    D-Dimer: No results for input(s): "DDIMER" in the last 72 hours. Hgb A1c: No results for input(s): "HGBA1C" in the last 72 hours. Lipid Profile: No results for input(s): "CHOL", "HDL", "LDLCALC", "TRIG", "CHOLHDL", "LDLDIRECT" in the last 72 hours. Thyroid function studies: No results for input(s): "TSH", "T4TOTAL", "T3FREE", "THYROIDAB" in the last 72 hours.  Invalid input(s): "FREET3" Anemia work up: Recent Labs    03/24/23 0559  VITAMINB12 1,032*   Sepsis Labs: Recent Labs  Lab 03/23/23 1230 03/24/23 0742  WBC  8.8 3.5*    Microbiology Recent Results (from the past 240 hours)  Resp panel by RT-PCR (RSV, Flu A&B, Covid) Anterior Nasal Swab     Status: None   Collection Time: 03/23/23  7:31 PM   Specimen: Anterior Nasal Swab  Result Value Ref Range Status   SARS Coronavirus 2 by RT PCR NEGATIVE NEGATIVE Final    Comment: (NOTE) SARS-CoV-2 target nucleic acids are NOT DETECTED.  The SARS-CoV-2 RNA is generally detectable in upper respiratory specimens during the acute phase of infection. The lowest concentration of SARS-CoV-2 viral copies this assay can detect is 138 copies/mL. A negative result does not preclude SARS-Cov-2 infection and should not be used as the sole basis for treatment or other patient management decisions. A negative result may occur with  improper specimen collection/handling, submission of specimen other than nasopharyngeal swab, presence of viral mutation(s) within the areas targeted by this assay, and inadequate number of viral copies(<138 copies/mL). A negative result must be combined with clinical observations, patient history, and epidemiological information. The expected result is Negative.  Fact Sheet for Patients:  BloggerCourse.com  Fact Sheet for Healthcare Providers:  SeriousBroker.it  This test is no t yet approved or cleared by the [place] FDA and  has been authorized for  detection and/or diagnosis of SARS-CoV-2 by FDA under an Emergency Use Authorization (EUA). This EUA will remain  in effect (meaning this test can be used) for the duration of the COVID-19 declaration under Section 564(b)(1) of the Act, 21 U.S.C.section 360bbb-3(b)(1), unless the authorization is terminated  or revoked sooner.       Influenza A by PCR NEGATIVE NEGATIVE Final   Influenza B by PCR NEGATIVE NEGATIVE Final    Comment: (NOTE) The Xpert Xpress SARS-CoV-2/FLU/RSV plus assay is intended as an aid in the diagnosis of influenza from Nasopharyngeal swab specimens and should not be used as a sole basis for treatment. Nasal washings and aspirates are unacceptable for Xpert Xpress SARS-CoV-2/FLU/RSV testing.  Fact Sheet for Patients: BloggerCourse.com  Fact Sheet for Healthcare Providers: SeriousBroker.it  This test is not yet approved or cleared by the [place] FDA and has been authorized for detection and/or diagnosis of SARS-CoV-2 by FDA under an Emergency Use Authorization (EUA). This EUA will remain in effect (meaning this test can be used) for the duration of the COVID-19 declaration under Section 564(b)(1) of the Act, 21 U.S.C. section 360bbb-3(b)(1), unless the authorization is terminated or revoked.     Resp Syncytial Virus by PCR NEGATIVE NEGATIVE Final    Comment: (NOTE) Fact Sheet for Patients: BloggerCourse.com  Fact Sheet for Healthcare Providers: SeriousBroker.it  This test is not yet approved or cleared by the [place] FDA and has been authorized for detection and/or diagnosis of SARS-CoV-2 by FDA under an Emergency Use Authorization (EUA). This EUA will remain in effect (meaning this test can be used) for the duration of the COVID-19 declaration under Section 564(b)(1) of the Act, 21 U.S.C. section 360bbb-3(b)(1), unless the authorization is  terminated or revoked.  Performed at [facility], [address] [place], [place] [address]   Respiratory (~20 pathogens) panel by PCR     Status: None   Collection Time: 03/24/23  4:50 AM   Specimen: Nasopharyngeal Swab; Respiratory  Result Value Ref Range Status   Adenovirus NOT DETECTED NOT DETECTED Final   Coronavirus 229E NOT DETECTED NOT DETECTED Final    Comment: (NOTE) The Coronavirus on the Respiratory Panel, DOES NOT test for the novel  Coronavirus (2019 nCoV)    Coronavirus HKU1 NOT DETECTED NOT DETECTED Final   Coronavirus NL63 NOT DETECTED NOT DETECTED Final   Coronavirus OC43 NOT DETECTED NOT DETECTED Final   Metapneumovirus NOT DETECTED NOT DETECTED Final   Rhinovirus / Enterovirus NOT DETECTED NOT DETECTED Final   Influenza A NOT DETECTED NOT DETECTED Final   Influenza B NOT DETECTED NOT DETECTED Final   Parainfluenza Virus 1 NOT DETECTED NOT DETECTED Final   Parainfluenza Virus 2 NOT DETECTED NOT DETECTED Final   Parainfluenza Virus 3 NOT DETECTED NOT DETECTED Final   Parainfluenza Virus 4 NOT DETECTED NOT DETECTED Final   Respiratory Syncytial Virus NOT DETECTED NOT DETECTED Final   Bordetella pertussis NOT DETECTED NOT DETECTED Final   Bordetella Parapertussis NOT DETECTED NOT DETECTED Final   Chlamydophila pneumoniae NOT DETECTED NOT DETECTED Final   Mycoplasma pneumoniae NOT DETECTED NOT DETECTED Final    Comment: Performed at [facility], [address], [place], [place] [address]    Procedures and diagnostic studies:  No results found.              LOS: 2 days   Sharonda Llamas  [facility]   Pager on [email or web address]. If 7PM-7AM, please contact night-coverage at [email or web address]     03/26/2023, 5:53 PM

## 2023-03-27 DIAGNOSIS — E871 Hypo-osmolality and hyponatremia: Secondary | ICD-10-CM | POA: Diagnosis not present

## 2023-03-27 LAB — BASIC METABOLIC PANEL
Anion gap: 10 (ref 5–15)
BUN: 35 mg/dL — ABNORMAL HIGH (ref 8–23)
CO2: 28 mmol/L (ref 22–32)
Calcium: 9 mg/dL (ref 8.9–10.3)
Chloride: 93 mmol/L — ABNORMAL LOW (ref 98–111)
Creatinine, Ser: 0.75 mg/dL (ref 0.44–1.00)
GFR, Estimated: >60 mL/min (ref >60)
Glucose, Bld: 104 mg/dL — ABNORMAL HIGH (ref 70–99)
Potassium: 4.1 mmol/L (ref 3.5–5.1)
Sodium: 131 mmol/L — ABNORMAL LOW (ref 135–145)

## 2023-03-27 LAB — GLUCOSE, CAPILLARY: Glucose-Capillary: 100 mg/dL — ABNORMAL HIGH (ref 70–99)

## 2023-03-27 MED ORDER — FUROSEMIDE 20 MG PO TABS
20.0000 mg | ORAL_TABLET | Freq: Every day | ORAL | Status: DC
  Administered 2023-03-28 – 2023-04-02 (×5): 20 mg via ORAL
  Filled 2023-03-27 (×6): qty 1

## 2023-03-27 NOTE — Care Management Important Message (Signed)
Important Message  Patient Details  Name: Anita Walker MRN: [id number] Date of Birth: 08/16/1940   Important Message Given:  Other (see comment)     Sherilyn Banker 03/27/2023, 12:25 PM

## 2023-03-27 NOTE — Plan of Care (Signed)
  Problem: Education: Goal: Knowledge of General Education information will improve Description: Including pain rating scale, medication(s)/side effects and non-pharmacologic comfort measures Outcome: Not Progressing   Problem: Clinical Measurements: Goal: Respiratory complications will improve Outcome: Progressing Goal: Cardiovascular complication will be avoided Outcome: Progressing   Problem: Activity: Goal: Risk for activity intolerance will decrease Outcome: Not Progressing

## 2023-03-27 NOTE — Evaluation (Signed)
Physical Therapy Evaluation Patient Details Name: Anita Walker MRN: [id number] DOB: August 08, 1940 Today's Date: 03/27/2023  History of Present Illness  presented to ER secondary to progressive weakness, cough , SOB; admitted for management of hyponatremia, LE edema.  Clinical Impression  Patient resting in bed upon arrival to room; alert and oriented to self, location, month and year.  Generally confused to more complex information/recall.  Follows simple commands, but often limited by extreme HOH (hearing aides charging; does prefer to see mouth/lips for optimal comprehension).  Indicates generalized pain/soreness to bilat LEs (FACES 4/10) with movement; improved with repositioning.  Globally weak and deconditioned throughout all extremities, but does demonstrate active movement throughout grossly functional ROM. Currently requiring mod/max assist for bed mobility; mod/max assist +2 for sit/stand, standing balance and bed/chair with RW.  Demonstrates forward flexed posture, heavy WBing bilat UEs; very short, shuffling steps (requiring mod manual facilitaiton from therapist to weight shift/unweight LEs). Very effortful and deliberate; high anxiety re: falling. Constant, step-by-step cuing required to complete.  Unsafe/unable to tolerate additional gait at this time; will continue to assess/progress in subsequent sessions as appropriate. Would benefit from skilled PT to address above deficits and promote optimal return to PLOF.; recommend post-acute PT follow up as indicated by interdisciplinary care team.             If plan is discharge home, recommend the following: Two people to help with bathing/dressing/bathroom;Two people to help with walking and/or transfers   Can travel by private vehicle   No    Equipment Recommendations    Recommendations for Other Services       Functional Status Assessment Patient has had a recent decline in their functional status and demonstrates the ability  to make significant improvements in function in a reasonable and predictable amount of time.     Precautions / Restrictions Precautions Precautions: Fall Restrictions Weight Bearing Restrictions Per Provider Order: No      Mobility  Bed Mobility Overal bed mobility: Needs Assistance Bed Mobility: Supine to Sit     Supine to sit: Max assist, Mod assist     General bed mobility comments: Unsupported sitting balance, min assist once positioned and upright (though max/total assist to scoot/align)    Transfers Overall transfer level: Needs assistance Equipment used: Rolling walker (2 wheels) Transfers: Sit to/from Stand, Bed to chair/wheelchair/BSC Sit to Stand: Max assist, +2 physical assistance, Mod assist Stand pivot transfers: Mod assist, Max assist, +2 physical assistance         General transfer comment: forward flexed posture, heavy WBing bilat UEs; very short, shuffling steps (requiring mod manual facilitaiton from therapist to weight shift/unweight LEs).  Very effortful and deliberate; high anxiety re: falling.  Constant, step-by-step cuing required to complete    Ambulation/Gait               General Gait Details: deferred  Stairs            Wheelchair Mobility     Tilt Bed    Modified Rankin (Stroke Patients Only)       Balance Overall balance assessment: Needs assistance Sitting-balance support: No upper extremity supported, Feet supported Sitting balance-Leahy Scale: Fair     Standing balance support: Bilateral upper extremity supported Standing balance-Leahy Scale: Poor Standing balance comment: extensive +2 support                             Pertinent Vitals/Pain Pain Assessment  Pain Assessment: Faces Faces Pain Scale: Hurts little more Pain Location: bilat LEs Pain Descriptors / Indicators: Aching, Grimacing Pain Intervention(s): Limited activity within patient's tolerance, Monitored during session, Repositioned     Home Living Family/patient expects to be discharged to:: Private residence Living Arrangements: Children Available Help at Discharge: Available PRN/intermittently;Family (patient reports aide 4 days/week?) Type of Home: House Home Access: Stairs to enter   [employer] of Steps: 3-5   Home Layout: Two level;Able to live on main level with bedroom/bathroom Home Equipment: Agricultural consultant (2 wheels) Additional Comments: Information obtained from patient; will verify with family as available and appropriate    Prior Function Prior Level of Function : Independent/Modified Independent             Mobility Comments: Per patient report, mod indep with RW fo rhousehold distances; endorses at least 1 fall in recent timeframe       Extremity/Trunk Assessment   Upper Extremity Assessment Upper Extremity Assessment: Generalized weakness    Lower Extremity Assessment Lower Extremity Assessment: Generalized weakness (grossly at least 3-/5 throughout; LEs generally edematous)       Communication   Communication Communication: Hearing impairment (Hearing aides charging; prefers to see mouth/read lips to help with comprehenion)  Cognition Arousal: Alert Behavior During Therapy: WFL for tasks assessed/performed Overall Cognitive Status: Within Functional Limits for tasks assessed                                 General Comments: Alert and oriented to basic information, though generally confused to more complex details.  Follows simple commands, generally limited by [facility]        General Comments      Exercises     Assessment/Plan    PT Assessment Patient needs continued PT services  PT Problem List Decreased strength;Decreased activity tolerance;Decreased balance;Decreased mobility;Decreased knowledge of use of DME;Decreased safety awareness;Decreased knowledge of precautions;Cardiopulmonary status limiting activity;Pain       PT Treatment  Interventions DME instruction;Gait training;Stair training;Functional mobility training;Therapeutic activities;Therapeutic exercise;Balance training;Patient/family education;Cognitive remediation    PT Goals (Current goals can be found in the Care Plan section)  Acute Rehab PT Goals Patient Stated Goal: to get strength back in my legs PT Goal Formulation: With patient Time For Goal Achievement: 04/10/23 Potential to Achieve Goals: Fair    Frequency Min 1X/week     Co-evaluation               AM-PAC PT "6 Clicks" Mobility  Outcome Measure Help needed turning from your back to your side while in a flat bed without using bedrails?: A Lot Help needed moving from lying on your back to sitting on the side of a flat bed without using bedrails?: A Lot Help needed moving to and from a bed to a chair (including a wheelchair)?: A Lot Help needed standing up from a chair using your arms (e.g., wheelchair or bedside chair)?: A Lot Help needed to walk in hospital room?: Total Help needed climbing 3-5 steps with a railing? : Total 6 Click Score: 10    End of Session Equipment Utilized During Treatment: Gait belt Activity Tolerance: Patient tolerated treatment well Patient left: in chair;with call bell/phone within reach;with chair alarm set Nurse Communication: Mobility status (rec for hoyer return to bed (sling placed in room)) PT Visit Diagnosis: Muscle weakness (generalized) (M62.81);Difficulty in walking, not elsewhere classified (R26.2)    Time: 4259-5638 PT Time Calculation (  min) (ACUTE ONLY): 36 min   Charges:   PT Evaluation $PT Eval Moderate Complexity: 1 Mod   PT General Charges $$ ACUTE PT VISIT: 1 Visit        Jonise Weightman H. Manson Passey, PT, DPT, NCS 03/27/23, 4:31 PM [phone]

## 2023-03-27 NOTE — Progress Notes (Signed)
1/21 Patient under Droplet Precautions, I spoke to patient's daughter Anita Walker) via telephone @ [phone] to discuss the contents of the IMM Letter and I received verbal consent and acknowledgement.

## 2023-03-27 NOTE — Progress Notes (Addendum)
Progress Note    Anita Walker  ZOX:[id number] DOB: [birth year]  DOA: 03/23/2023 PCP: Dale , MD      Brief Narrative:    Medical records reviewed and are as summarized below:  Anita Walker is a 83 y.o. female with medical history significant of hypertension, hyperlipidemia, diet-controlled diabetes, PE not on anticoagulants due to fall, anxiety, gastric ulcer, GERD, anemia, breast cancer (s/p of left lumpectomy, chemo and radiation therapy), who presented to the ED because of hallucinations, general weakness, cough and wheezing.  Reportedly, she fell accidentally 3 days prior to admission.        Assessment/Plan:   Principal Problem:   Hyponatremia Active Problems:   Bilateral leg edema   Acute bronchitis   Hypertension   Hypercholesteremia   Type 2 diabetes mellitus with neurological complications (HCC)   Mixed emotional features as adjustment reaction   Anxiety   Hallucination   Obesity (BMI 30-39.9)   Acute on chronic heart failure with preserved ejection fraction (HFpEF) (HCC)    Body mass index is 35.88 kg/m.  (Obesity)   Acute on chronic hyponatremia: Sodium level is fluctuating but stable.   Cough, wheezing, suspected acute bronchitis Acute on chronic diastolic CHF: Discontinue IV Lasix.  Start oral Lasix tomorrow. She has completed a course of steroids. Respiratory viral panel was negative.  CT chest showed findings suspicious for pulmonary edema. 2D echo showed EF estimated at 50 to 55%, mild concentric LVH, indeterminate LV Parameters, mild MR, mild to moderate TR BNP 147.2   Bilateral leg edema: Improved.  Venous duplex of lower extremity was negative.   Patient also has chronic venous insufficiency and had some leg edema at baseline.   Hallucinations: Improved.  Exact etiology is unclear.  Probably from delirium from acute illness.  MRI brain did not show any evidence of acute stroke.   Vitamin B12 1,032   1.5 cm right  thyroid nodule: Outpatient follow-up with thyroid ultrasound   General Weakness: PT evaluation is pending   Comorbidities include hypertension, hypercholesterolemia, type II DM (recent A1c was 6), anxiety, history of atrial fibrillation and pulmonary embolism not on anticoagulation because of high fall risk, remote history of breast cancer s/p lumpectomy and chemoradiation in 1992   She will likely need discharge to SNF.  Plan was discussed with Mardene Celeste, daughter, over the phone.  She said she would like patient to go to SNF.      Diet Order             Diet regular Fluid consistency: Thin; Fluid restriction: 1200 mL Fluid  Diet effective now                            Consultants: None  Procedures: None    Medications:    amLODipine  2.5 mg Oral Daily   aspirin EC  81 mg Oral Daily   busPIRone  5 mg Oral Q1400   enoxaparin (LOVENOX) injection  0.5 mg/kg Subcutaneous Q24H   ferrous sulfate  325 mg Oral QODAY   [START ON 03/28/2023] furosemide  20 mg Oral Daily   multivitamin with minerals  1 tablet Oral Daily   pantoprazole  40 mg Oral Daily   potassium chloride  40 mEq Oral Once   simvastatin  10 mg Oral Daily   Continuous Infusions:   Anti-infectives (From admission, onward)    None  Family Communication/Anticipated D/C date and plan/Code Status   DVT prophylaxis:      Code Status: Limited: Do not attempt resuscitation (DNR) -DNR-LIMITED -Do Not Intubate/DNI   Family Communication: Plan discussed with Mardene Celeste, daughter, over the phone. Disposition Plan: Plan to discharge to SNF   Status is: Inpatient Remains inpatient appropriate because: Awaiting placement to SNF      Subjective:   No acute events overnight.  She is feeling better today.  She has no complaints.  No shortness of breath or chest pain.  Cough is better.  Objective:    Vitals:   03/27/23 0500 03/27/23 0758 03/27/23 1151 03/27/23 1547  BP: (!)  149/75 132/61 (!) 126/58 119/66  Pulse: 93 77 (!) 101 87  Resp: 19 18    Temp: 98.4 F (36.9 C) 98 F (36.7 C) 98 F (36.7 C) 97.7 F (36.5 C)  TempSrc: Oral     SpO2: 92% 93% 95% 93%  Weight:      Height:       No data found.   Intake/Output Summary (Last 24 hours) at 03/27/2023 1610 Last data filed at 03/27/2023 0459 Gross per 24 hour  Intake --  Output 900 ml  Net -900 ml   Filed Weights   03/24/23 0307 03/27/23 0500  Weight: 114.3 kg 110.2 kg    Exam:  GEN: NAD SKIN: Warm and dry EYES: No pallor or icterus ENT: MMM, hard of hearing CV: RRR PULM: No wheezing or rales heard ABD: soft, ND, NT, +BS CNS: AAO x 3, non focal EXT: Chronic lower extremity edema but overall edema has improved.  Some tenderness in bilateral legs at baseline.  Chronic changes (varicose veins, erythematous changes) in the legs and feet suggestive of chronic venous insufficiency         Data Reviewed:   I have personally reviewed following labs and imaging studies:  Labs: Labs show the following:   Basic Metabolic Panel: Recent Labs  Lab 03/24/23 0559 03/24/23 0822 03/25/23 0521 03/26/23 1018 03/27/23 0545  NA 129* 130* 129* 131* 131*  K 4.1 3.9 4.6 3.4* 4.1  CL 96* 95* 95* 94* 93*  CO2 26 24 23 27 28   GLUCOSE 127* 157* 137* 121* 104*  BUN 12 11 21  34* 35*  CREATININE 0.58 0.61 0.68 0.87 0.75  CALCIUM 9.4 9.3 9.6 9.2 9.0  MG  --   --  1.9 1.9  --    GFR Estimated Creatinine Clearance: 68 mL/min (by C-G formula based on SCr of 0.75 mg/dL). Liver Function Tests: No results for input(s): "AST", "ALT", "ALKPHOS", "BILITOT", "PROT", "ALBUMIN" in the last 168 hours.  No results for input(s): "LIPASE", "AMYLASE" in the last 168 hours. No results for input(s): "AMMONIA" in the last 168 hours. Coagulation profile No results for input(s): "INR", "PROTIME" in the last 168 hours.  CBC: Recent Labs  Lab 03/23/23 1230 03/24/23 0742  WBC 8.8 3.5*  HGB 13.8 14.0  HCT 40.0  40.9  MCV 87.5 88.0  PLT 155 178   Cardiac Enzymes: No results for input(s): "CKTOTAL", "CKMB", "CKMBINDEX", "TROPONINI" in the last 168 hours. BNP (last 3 results) No results for input(s): "PROBNP" in the last 8760 hours. CBG: Recent Labs  Lab 03/24/23 0749 03/25/23 0838 03/26/23 0809 03/27/23 0757  GLUCAP 129* 198* 134* 100*   D-Dimer: No results for input(s): "DDIMER" in the last 72 hours. Hgb A1c: No results for input(s): "HGBA1C" in the last 72 hours. Lipid Profile: No results for input(s): "CHOL", "  HDL", "LDLCALC", "TRIG", "CHOLHDL", "LDLDIRECT" in the last 72 hours. Thyroid function studies: No results for input(s): "TSH", "T4TOTAL", "T3FREE", "THYROIDAB" in the last 72 hours.  Invalid input(s): "FREET3" Anemia work up: No results for input(s): "VITAMINB12", "FOLATE", "FERRITIN", "TIBC", "IRON", "RETICCTPCT" in the last 72 hours.  Sepsis Labs: Recent Labs  Lab 03/23/23 1230 03/24/23 0742  WBC 8.8 3.5*    Microbiology Recent Results (from the past 240 hours)  Resp panel by RT-PCR (RSV, Flu A&B, Covid) Anterior Nasal Swab     Status: None   Collection Time: 03/23/23  7:31 PM   Specimen: Anterior Nasal Swab  Result Value Ref Range Status   SARS Coronavirus 2 by RT PCR NEGATIVE NEGATIVE Final    Comment: (NOTE) SARS-CoV-2 target nucleic acids are NOT DETECTED.  The SARS-CoV-2 RNA is generally detectable in upper respiratory specimens during the acute phase of infection. The lowest concentration of SARS-CoV-2 viral copies this assay can detect is 138 copies/mL. A negative result does not preclude SARS-Cov-2 infection and should not be used as the sole basis for treatment or other patient management decisions. A negative result may occur with  improper specimen collection/handling, submission of specimen other than nasopharyngeal swab, presence of viral mutation(s) within the areas targeted by this assay, and inadequate number of viral copies(<138 copies/mL).  A negative result must be combined with clinical observations, patient history, and epidemiological information. The expected result is Negative.  Fact Sheet for Patients:  BloggerCourse.com  Fact Sheet for Healthcare Providers:  SeriousBroker.it  This test is no t yet approved or cleared by the [place] FDA and  has been authorized for detection and/or diagnosis of SARS-CoV-2 by FDA under an Emergency Use Authorization (EUA). This EUA will remain  in effect (meaning this test can be used) for the duration of the COVID-19 declaration under Section 564(b)(1) of the Act, 21 U.S.C.section 360bbb-3(b)(1), unless the authorization is terminated  or revoked sooner.       Influenza A by PCR NEGATIVE NEGATIVE Final   Influenza B by PCR NEGATIVE NEGATIVE Final    Comment: (NOTE) The Xpert Xpress SARS-CoV-2/FLU/RSV plus assay is intended as an aid in the diagnosis of influenza from Nasopharyngeal swab specimens and should not be used as a sole basis for treatment. Nasal washings and aspirates are unacceptable for Xpert Xpress SARS-CoV-2/FLU/RSV testing.  Fact Sheet for Patients: BloggerCourse.com  Fact Sheet for Healthcare Providers: SeriousBroker.it  This test is not yet approved or cleared by the [place] FDA and has been authorized for detection and/or diagnosis of SARS-CoV-2 by FDA under an Emergency Use Authorization (EUA). This EUA will remain in effect (meaning this test can be used) for the duration of the COVID-19 declaration under Section 564(b)(1) of the Act, 21 U.S.C. section 360bbb-3(b)(1), unless the authorization is terminated or revoked.     Resp Syncytial Virus by PCR NEGATIVE NEGATIVE Final    Comment: (NOTE) Fact Sheet for Patients: BloggerCourse.com  Fact Sheet for Healthcare  Providers: SeriousBroker.it  This test is not yet approved or cleared by the [place] FDA and has been authorized for detection and/or diagnosis of SARS-CoV-2 by FDA under an Emergency Use Authorization (EUA). This EUA will remain in effect (meaning this test can be used) for the duration of the COVID-19 declaration under Section 564(b)(1) of the Act, 21 U.S.C. section 360bbb-3(b)(1), unless the authorization is terminated or revoked.  Performed at [facility], [address], [place], [place] [address]   Respiratory (~20 pathogens) panel by  PCR     Status: None   Collection Time: 03/24/23  4:50 AM   Specimen: Nasopharyngeal Swab; Respiratory  Result Value Ref Range Status   Adenovirus NOT DETECTED NOT DETECTED Final   Coronavirus 229E NOT DETECTED NOT DETECTED Final    Comment: (NOTE) The Coronavirus on the Respiratory Panel, DOES NOT test for the novel  Coronavirus (2019 nCoV)    Coronavirus HKU1 NOT DETECTED NOT DETECTED Final   Coronavirus NL63 NOT DETECTED NOT DETECTED Final   Coronavirus OC43 NOT DETECTED NOT DETECTED Final   Metapneumovirus NOT DETECTED NOT DETECTED Final   Rhinovirus / Enterovirus NOT DETECTED NOT DETECTED Final   Influenza A NOT DETECTED NOT DETECTED Final   Influenza B NOT DETECTED NOT DETECTED Final   Parainfluenza Virus 1 NOT DETECTED NOT DETECTED Final   Parainfluenza Virus 2 NOT DETECTED NOT DETECTED Final   Parainfluenza Virus 3 NOT DETECTED NOT DETECTED Final   Parainfluenza Virus 4 NOT DETECTED NOT DETECTED Final   Respiratory Syncytial Virus NOT DETECTED NOT DETECTED Final   Bordetella pertussis NOT DETECTED NOT DETECTED Final   Bordetella Parapertussis NOT DETECTED NOT DETECTED Final   Chlamydophila pneumoniae NOT DETECTED NOT DETECTED Final   Mycoplasma pneumoniae NOT DETECTED NOT DETECTED Final    Comment: Performed at [facility], [address] [place], [place] [address]    Procedures  and diagnostic studies:  No results found.              LOS: 3 days   Teresha Hanks  [facility]   Pager on [email or web address]. If 7PM-7AM, please contact night-coverage at [email or web address]     03/27/2023, 4:10 PM

## 2023-03-28 DIAGNOSIS — J209 Acute bronchitis, unspecified: Secondary | ICD-10-CM

## 2023-03-28 DIAGNOSIS — E871 Hypo-osmolality and hyponatremia: Secondary | ICD-10-CM | POA: Diagnosis not present

## 2023-03-28 DIAGNOSIS — R600 Localized edema: Secondary | ICD-10-CM | POA: Diagnosis not present

## 2023-03-28 DIAGNOSIS — I10 Essential (primary) hypertension: Secondary | ICD-10-CM

## 2023-03-28 DIAGNOSIS — I5033 Acute on chronic diastolic (congestive) heart failure: Secondary | ICD-10-CM | POA: Diagnosis not present

## 2023-03-28 LAB — GLUCOSE, CAPILLARY: Glucose-Capillary: 107 mg/dL — ABNORMAL HIGH (ref 70–99)

## 2023-03-28 MED ORDER — SIMETHICONE 80 MG PO CHEW
80.0000 mg | CHEWABLE_TABLET | Freq: Four times a day (QID) | ORAL | Status: DC | PRN
  Administered 2023-03-28 (×2): 80 mg via ORAL
  Filled 2023-03-28 (×2): qty 1

## 2023-03-28 NOTE — Plan of Care (Signed)
  Problem: Education: Goal: Knowledge of General Education information will improve Description: Including pain rating scale, medication(s)/side effects and non-pharmacologic comfort measures Outcome: Not Progressing   Problem: Clinical Measurements: Goal: Respiratory complications will improve Outcome: Progressing Goal: Cardiovascular complication will be avoided Outcome: Progressing   Problem: Activity: Goal: Risk for activity intolerance will decrease Outcome: Not Progressing   Problem: Nutrition: Goal: Adequate nutrition will be maintained Outcome: Progressing

## 2023-03-28 NOTE — Progress Notes (Signed)
Heart Failure Navigator Progress Note  Assessed for Heart & Vascular TOC clinic readiness.  Patient does not meet criteria due to SNF placement after discharge.  Navigator will sign off at this time.  Roxy Horseman, RN, BSN [facility] Heart Failure Navigator Secure Chat Only

## 2023-03-28 NOTE — Evaluation (Addendum)
Occupational Therapy Evaluation Patient Details Name: Anita Walker MRN: [id number] DOB: 10-05-1940 Today's Date: 03/28/2023   History of Present Illness presented to ER secondary to progressive weakness, cough , SOB; admitted for management of hyponatremia, LE edema.   Clinical Impression   Chart reviewed, pt greeted in bed, alert, oriented to self and place, [facility], requesting therapist to place hearing aids into charger to charge. Pt is a ?historian on this date, reports she amb with RW, has assist for ADL, attempted to call daughter with pt permission and left VM, will confirm PLOF as able. Pt presents with deficits in strength, endurance, activity tolerance, balance, cognition, affecting safe and optimal ADL completion. MOD-MAX A required for supine>sit tolerates sitting on edge of bed for approx 3 minutes with CGA, then pt with posterior lean requiring assist to maintain sitting and pt reports she feels nauseous. MAX A +2 for return to semi supine. MAX A for LB dressing, Supervision for feeding on this date after mobility. Pt will benefit from acute OT to address deficits and to facilitate optimal ADL performance. Pt is left in care of RN, all needs met. OT will follow.   Addendum: Spoke with daughter Mardene Celeste and pt was amb household distances with RW, fall history; Was able to dress herself and shower herself; family/aid assists with IADLs such as cooking, cleaning, med management. Aid comes in every day for a few hrs.       If plan is discharge home, recommend the following: A lot of help with walking and/or transfers;A lot of help with bathing/dressing/bathroom;Assistance with cooking/housework;Direct supervision/assist for medications management;Assist for transportation;Help with stairs or ramp for entrance;Direct supervision/assist for financial management    Functional Status Assessment  Patient has had a recent decline in their functional status and demonstrates the ability to make  significant improvements in function in a reasonable and predictable amount of time.  Equipment Recommendations  Other (comment) (defer to next venue of care)    Recommendations for Other Services       Precautions / Restrictions Precautions Precautions: Fall Restrictions Weight Bearing Restrictions Per Provider Order: No      Mobility Bed Mobility Overal bed mobility: Needs Assistance Bed Mobility: Supine to Sit, Sit to Supine     Supine to sit: Mod assist, Max assist Sit to supine: Max assist, +2 for safety/equipment, HOB elevated   General bed mobility comments: step by step multi modal cues    Transfers                   General transfer comment: unsafe to attempt on this date- pt with posterior lean after sitting edge of bed for approx 3 minutes      Balance Overall balance assessment: Needs assistance Sitting-balance support: No upper extremity supported, Feet supported Sitting balance-Leahy Scale: Poor Sitting balance - Comments: unsupported sitting for approx 3 minutes then pt with posterior lean, requiring assist to maintain sitting Postural control: Posterior lean                                 ADL either performed or assessed with clinical judgement   ADL Overall ADL's : Needs assistance/impaired Eating/Feeding: Supervision/ safety Eating/Feeding Details (indicate cue type and reason): semi supine in bed at end of session Grooming: Supervision/safety;Bed level           Upper Body Dressing : Moderate assistance Upper Body Dressing Details (indicate cue type  and reason): anticipate Lower Body Dressing: Maximal assistance;Bed level     Toilet Transfer Details (indicate cue type and reason): NT on this date- pt with nausea, coughing with position changes Toileting- Clothing Manipulation and Hygiene: Maximal assistance;Bed level;Total assistance               Vision Patient Visual Report: No change from baseline        Perception         Praxis         Pertinent Vitals/Pain Pain Assessment Pain Assessment: 0-10 Pain Score: 10-Worst pain ever Pain Location: BLE with movement Pain Descriptors / Indicators: Discomfort Pain Intervention(s): Limited activity within patient's tolerance, Monitored during session (RN in room)     Extremity/Trunk Assessment Upper Extremity Assessment Upper Extremity Assessment: Generalized weakness   Lower Extremity Assessment Lower Extremity Assessment: Generalized weakness (BLE edema)       Communication Communication Communication: Hearing impairment Cueing Techniques: Verbal cues;Tactile cues;Visual cues;Gestural cues   Cognition Arousal: Alert Behavior During Therapy: WFL for tasks assessed/performed Overall Cognitive Status: Impaired/Different from baseline Area of Impairment: Orientation, Attention, Following commands, Awareness, Problem solving                 Orientation Level: Disoriented to, Situation, Time Current Attention Level: Sustained   Following Commands: Follows one step commands with increased time, Follows one step commands inconsistently (with multi modal cues)   Awareness: Emergent Problem Solving: Requires verbal cues, Requires tactile cues General Comments: Pt is extremely HOH- will further assess cognition vs HOH limitations; daughter confirms cognition is not baseline      General Comments  spo2 87% on RA after return to supine, >90% after approx 1 minute on RA, HR 104 bpm after mobility    Exercises Other Exercises Other Exercises: edu re: role of OT, role of rehab, optimal positioning for feeding   Shoulder Instructions      Home Living Family/patient expects to be discharged to:: Private residence Living Arrangements: Children Available Help at Discharge: Available PRN/intermittently;Family Type of Home: House Home Access: Stairs to enter [employer] of Steps: 3-5   Home Layout: Two level;Able to  live on main level with bedroom/bathroom                   Additional Comments: information from chart, pt poor historian at time of evaluation. Attempted to call daugther Mardene Celeste at pt request, left VM; will confirm as able      Prior Functioning/Environment Prior Level of Function : Independent/Modified Independent;History of Falls (last six months);Patient poor historian/Family not available             Mobility Comments: pt reports amb with RW, endorses falls ADLs Comments: pt reports assist with ADL, inconsistent information regarding what type of assist; will need to confirm        OT Problem List: Decreased strength;Decreased activity tolerance;Decreased knowledge of use of DME or AE;Decreased safety awareness;Impaired balance (sitting and/or standing);Increased edema      OT Treatment/Interventions: Self-care/ADL training;Therapeutic exercise;Energy conservation;DME and/or AE instruction;Therapeutic activities;Patient/family education;Balance training    OT Goals(Current goals can be found in the care plan section) Acute Rehab OT Goals Patient Stated Goal: get stronger OT Goal Formulation: With patient Time For Goal Achievement: 04/11/23 Potential to Achieve Goals: Good ADL Goals Pt Will Perform Grooming: sitting;with supervision Pt Will Perform Lower Body Dressing: with mod assist;sitting/lateral leans Pt Will Transfer to Toilet: with mod assist;stand pivot transfer Pt Will Perform Toileting - Clothing Manipulation  and hygiene: with mod assist;sitting/lateral leans;sit to/from stand  OT Frequency: Min 1X/week    Co-evaluation              AM-PAC OT "6 Clicks" Daily Activity     Outcome Measure Help from another person eating meals?: A Little Help from another person taking care of personal grooming?: A Little Help from another person toileting, which includes using toliet, bedpan, or urinal?: Total Help from another person bathing (including washing,  rinsing, drying)?: Total Help from another person to put on and taking off regular upper body clothing?: A Little Help from another person to put on and taking off regular lower body clothing?: A Lot 6 Click Score: 13   End of Session Nurse Communication: Mobility status  Activity Tolerance: Patient tolerated treatment well Patient left: in bed;with call bell/phone within reach;with bed alarm set;with nursing/sitter in room  OT Visit Diagnosis: Other abnormalities of gait and mobility (R26.89);Muscle weakness (generalized) (M62.81);History of falling (Z91.81)                Time: 4098-1191 OT Time Calculation (min): 21 min Charges:  OT General Charges $OT Visit: 1 Visit OT Evaluation $OT Eval Moderate Complexity: 1 Mod  Oleta Mouse, OTD OTR/L  03/28/23, 10:27 AM

## 2023-03-28 NOTE — Progress Notes (Signed)
Progress Note   Patient: Anita Walker ZHY:[id number] DOB: Mar 27, 1940 DOA: 03/23/2023     4 DOS: the patient was seen and examined on 03/28/2023   Brief hospital course: Anita Walker is a 83 y.o. female with medical history significant of hypertension, hyperlipidemia, diet-controlled diabetes, PE not on anticoagulants due to fall, anxiety, gastric ulcer, GERD, anemia, breast cancer (s/p of left lumpectomy, chemo and radiation therapy), who presented to the ED because of hallucinations, general weakness, cough and wheezing.  Reportedly, she fell accidentally 3 days prior to admission.   Assessment and Plan: Acute on chronic hyponatremia:  Sodium level stable at 131.  Cough, wheezing, suspected acute bronchitis. She has completed a course of steroids.  Acute on chronic diastolic CHF:  Respiratory viral panel was negative.  CT chest showed findings suspicious for pulmonary edema. 2D echo showed EF estimated at 50 to 55%, mild concentric LVH, indeterminate LV Parameters, mild MR, mild to moderate TR BNP 147.2. IV Lasix transitioned to oral Lasix. Monitor daily weights, strict input and output.   Bilateral leg edema: Improved.  Venous duplex of lower extremity was negative.   Patient also has chronic venous insufficiency and had some leg edema at baseline.   Hallucinations: Improved.  Exact etiology is unclear.  Probably from delirium from acute illness.  MRI brain did not show any evidence of acute stroke.   Vitamin B12 1,032   1.5 cm right thyroid nodule: Outpatient follow-up with thyroid ultrasound   General Weakness: PT evaluation. TOC working on SNF placement.   Hypertension: Stable BP. Continue Amlodipine.  Hypercholesterolemia- On statin ( Zocor 10mg ).  Type II DM (recent A1c was 6),  Diet control. Hypoglycemia protocol.  History of atrial fibrillation and pulmonary embolism not on anticoagulation because of high fall risk.  Remote history of breast cancer s/p  lumpectomy and chemoradiation in 1992  Anxiety- On buspar therapy.  Obesity- BMI 36.07 Diet, exercise and weight reduction advised.     Out of bed to chair. Incentive spirometry. Nursing supportive care. Fall, aspiration precautions. DVT prophylaxis   Code Status: Limited: Do not attempt resuscitation (DNR) -DNR-LIMITED -Do Not Intubate/DNI  Subjective: Patient is seen and examined today morning. She asks for increased pain meds. Says her legs are bothering. Did not get out of bed. Eating fair.  Physical Exam: Vitals:   03/28/23 0500 03/28/23 0740 03/28/23 1146 03/28/23 1555  BP:  (!) 141/70 128/89 (!) 128/58  Pulse:  85 84 79  Resp:      Temp:  (!) 97.5 F (36.4 C) 97.9 F (36.6 C) 98.5 F (36.9 C)  TempSrc:      SpO2:  93% 94% 93%  Weight: 110.8 kg     Height:        General - Elderly obese Caucasian female, in distress due to pain. HEENT - PERRLA, EOMI, atraumatic head, non tender sinuses. Lung - distant breath sounds, diffuse rales, rhonchi, no wheezes. Heart - S1, S2 heard, no murmurs, rubs, 1+ pedal edema. Abdomen - Soft, non tender, bowel sounds good Neuro - Alert, awake and oriented x 3, non focal exam. Skin - Warm and dry.  Data Reviewed:      Latest Ref Rng & Units 03/24/2023    7:42 AM 03/23/2023   12:30 PM 12/20/2022   12:02 PM  CBC  WBC 4.0 - 10.5 K/uL 3.5  8.8  7.2   Hemoglobin 12.0 - 15.0 g/dL 29.5  28.4  13.2   Hematocrit 36.0 - 46.0 %  40.9  40.0  39.9   Platelets 150 - 400 K/uL 178  155  230       Latest Ref Rng & Units 03/27/2023    5:45 AM 03/26/2023   10:18 AM 03/25/2023    5:21 AM  BMP  Glucose 70 - 99 mg/dL 409  811  914   BUN 8 - 23 mg/dL 35  34  21   Creatinine 0.44 - 1.00 mg/dL 7.82  9.56  2.13   Sodium 135 - 145 mmol/L 131  131  129   Potassium 3.5 - 5.1 mmol/L 4.1  3.4  4.6   Chloride 98 - 111 mmol/L 93  94  95   CO2 22 - 32 mmol/L 28  27  23    Calcium 8.9 - 10.3 mg/dL 9.0  9.2  9.6    No results found.  Family  Communication: Discussed with patient, she understand and agree. All questions answereed.  Disposition: Status is: Inpatient Remains inpatient appropriate because: placement  Planned Discharge Destination: Skilled nursing facility     Time spent: 38 minutes  Author: Marcelino Duster, MD 03/28/2023 4:28 PM Secure chat 7am to 7pm For on call review [email or web address].

## 2023-03-29 DIAGNOSIS — E871 Hypo-osmolality and hyponatremia: Secondary | ICD-10-CM | POA: Diagnosis not present

## 2023-03-29 DIAGNOSIS — R600 Localized edema: Secondary | ICD-10-CM | POA: Diagnosis not present

## 2023-03-29 DIAGNOSIS — J209 Acute bronchitis, unspecified: Secondary | ICD-10-CM | POA: Diagnosis not present

## 2023-03-29 DIAGNOSIS — I5033 Acute on chronic diastolic (congestive) heart failure: Secondary | ICD-10-CM | POA: Diagnosis not present

## 2023-03-29 MED ORDER — ALUM & MAG HYDROXIDE-SIMETH 200-200-20 MG/5ML PO SUSP
15.0000 mL | Freq: Four times a day (QID) | ORAL | Status: DC | PRN
  Administered 2023-03-29 – 2023-03-31 (×5): 15 mL via ORAL
  Filled 2023-03-29 (×5): qty 30

## 2023-03-29 NOTE — Plan of Care (Signed)

## 2023-03-29 NOTE — Progress Notes (Signed)
Physical Therapy Treatment Patient Details Name: Anita Walker MRN: [id number] DOB: [birth year] Today's Date: 03/29/2023   History of Present Illness presented to ER secondary to progressive weakness, cough , SOB; admitted for management of hyponatremia, LE edema.    PT Comments  Pt received in bed, very HOH (hearing aids at home). B LE slightly edematous and sensative to touch, skin intact. Pt required Mod/MaxA to transfer to EOB with HOB raised and use of side rail. Sitting EOB for several minutes with SBA feet and B Ue's supported. Pt able to stand from raised bed with Mod/MaxA to RW and assist to gain balance and confidence once standing. Pt's fear of falling self limits at times, however given clear cues and time to adjust with transfer/mobility, she does very well. Pt able to advance several steps to bedside chair with MinA for safety. Good wt shifting and upright stance. Fatigued once positioned in chair, SpO2 93% on RA. Pt positioned to comfort with all needs in reach. Hoyer pad in room if needed for transfer back to bed.    If plan is discharge home, recommend the following: A lot of help with walking and/or transfers;A lot of help with bathing/dressing/bathroom;Assist for transportation;Help with stairs or ramp for entrance   Can travel by private vehicle     No  Equipment Recommendations  Other (comment) (TBD at next level of care)    Recommendations for Other Services       Precautions / Restrictions Precautions Precautions: Fall Restrictions Weight Bearing Restrictions Per Provider Order: No     Mobility  Bed Mobility Overal bed mobility: Needs Assistance Bed Mobility: Supine to Sit     Supine to sit: Mod assist, Max assist     General bed mobility comments: step by step multi modal cues    Transfers Overall transfer level: Needs assistance Equipment used: Rolling walker (2 wheels) Transfers: Sit to/from Stand Sit to Stand: Mod assist, Max assist            General transfer comment: Pt's anxiety from fear of falling limits mobility at times. If given clear cues and time to "right" self in standing, she does well    Ambulation/Gait Ambulation/Gait assistance: Min assist Gait Distance (Feet): 3 Feet Assistive device: Rolling walker (2 wheels) Gait Pattern/deviations: Step-to pattern, Decreased dorsiflexion - right, Decreased dorsiflexion - left, Wide base of support Gait velocity: decr     General Gait Details: Modified gait bed to chair with RW. Good wt shifting with short steps and reliance on RW for balance.   Stairs             Wheelchair Mobility     Tilt Bed    Modified Rankin (Stroke Patients Only)       Balance Overall balance assessment: Needs assistance Sitting-balance support: Feet supported, Bilateral upper extremity supported Sitting balance-Leahy Scale: Fair Sitting balance - Comments: Sitting EOB x 5 minutes with supervision.                                    Cognition Arousal: Alert Behavior During Therapy: WFL for tasks assessed/performed Overall Cognitive Status: Within Functional Limits for tasks assessed                               Problem Solving: Requires verbal cues, Requires tactile cues General Comments: Pt is extremely  [facility]        Exercises General Exercises - Lower Extremity Ankle Circles/Pumps: AROM, Both, 10 reps, Supine Long Arc Quad: AROM, Both, 10 reps, Seated Other Exercises Other Exercises:  (Educated pt on role of PT, benefits of OOB activity and safe mobility techniques to complete bed to chair transfer)    General Comments General comments (skin integrity, edema, etc.): HOH, hearing aids at home. B LE's slightly edematous and sensative to light touch. Purewick intact      Pertinent Vitals/Pain Pain Assessment Pain Assessment: Faces Faces Pain Scale: Hurts little more Pain Location: BLE with movement Pain Descriptors / Indicators:  Discomfort Pain Intervention(s): Monitored during session    Home Living                          Prior Function            PT Goals (current goals can now be found in the care plan section) Acute Rehab PT Goals Patient Stated Goal: to get strength back in my legs Progress towards PT goals: Progressing toward goals    Frequency    Min 1X/week      PT Plan      Co-evaluation              AM-PAC PT "6 Clicks" Mobility   Outcome Measure  Help needed turning from your back to your side while in a flat bed without using bedrails?: A Lot Help needed moving from lying on your back to sitting on the side of a flat bed without using bedrails?: A Lot Help needed moving to and from a bed to a chair (including a wheelchair)?: A Lot Help needed standing up from a chair using your arms (e.g., wheelchair or bedside chair)?: A Lot Help needed to walk in hospital room?: A Lot Help needed climbing 3-5 steps with a railing? : Total 6 Click Score: 11    End of Session Equipment Utilized During Treatment: Gait belt Activity Tolerance: Patient tolerated treatment well Patient left: in chair;with call bell/phone within reach;with chair alarm set Nurse Communication: Mobility status;Other (comment) (Recommend Hoyer for back to bed if pt has difficulty standing. Pad in room on counter) PT Visit Diagnosis: Muscle weakness (generalized) (M62.81);Difficulty in walking, not elsewhere classified (R26.2)     Time: 1358-1430 PT Time Calculation (min) (ACUTE ONLY): 32 min  Charges:    $Gait Training: 8-22 mins $Therapeutic Activity: 8-22 mins PT General Charges $$ ACUTE PT VISIT: 1 Visit                    Zadie Cleverly, PTA  Jannet Askew 03/29/2023, 3:50 PM

## 2023-03-29 NOTE — Progress Notes (Signed)
Progress Note   Patient: Anita Walker BJY:[id number] DOB: 1940/11/10 DOA: 03/23/2023     5 DOS: the patient was seen and examined on 03/29/2023   Brief hospital course: Anita Walker is a 83 y.o. female with medical history significant of hypertension, hyperlipidemia, diet-controlled diabetes, PE not on anticoagulants due to fall, anxiety, gastric ulcer, GERD, anemia, breast cancer (s/p of left lumpectomy, chemo and radiation therapy), who presented to the ED because of hallucinations, general weakness, cough and wheezing.  Reportedly, she fell accidentally 3 days prior to admission.   Assessment and Plan: Acute on chronic hyponatremia:  Sodium level stable at 131. Will trend Na.  Cough, wheezing, suspected acute bronchitis. She has completed a course of steroids. C/o indigestion, started maalox.  Acute on chronic diastolic CHF:  Respiratory viral panel was negative.  CT chest showed findings suspicious for pulmonary edema. 2D echo showed EF estimated at 50 to 55%, mild concentric LVH, indeterminate LV Parameters, mild MR, mild to moderate TR BNP 147.2. Continue oral Lasix. Monitor daily weights, strict input and output.   Bilateral leg edema: Improved.  Venous duplex of lower extremity was negative.   Patient also has chronic venous insufficiency and had some leg edema at baseline.   Hallucinations: Improved.  Exact etiology is unclear.  Probably from delirium from acute illness.  MRI brain did not show any evidence of acute stroke.   Vitamin B12 1,032   1.5 cm right thyroid nodule: Outpatient follow-up with thyroid ultrasound   General Weakness: PT evaluation. TOC working on SNF placement.   Hypertension: Stable BP. Continue Amlodipine.  Hypercholesterolemia- On statin ( Zocor 10mg ).  Type II DM (recent A1c was 6),  Diet control. Hypoglycemia protocol.  History of atrial fibrillation and pulmonary embolism not on anticoagulation because of high fall risk.  Remote  history of breast cancer s/p lumpectomy and chemoradiation in 1992. Stable.  Anxiety- On buspar therapy.  Obesity- BMI 36.07 Diet, exercise and weight reduction advised.     Out of bed to chair. Incentive spirometry. Nursing supportive care. Fall, aspiration precautions. DVT prophylaxis   Code Status: Limited: Do not attempt resuscitation (DNR) -DNR-LIMITED -Do Not Intubate/DNI   Subjective: Patient is seen and examined today morning. Has indigestion. Her pain is better. Advised to work with PT.  Physical Exam: Vitals:   03/29/23 0400 03/29/23 0427 03/29/23 0740 03/29/23 1211  BP: (!) 115/52  (!) 135/58 (!) 134/96  Pulse: 75  79 80  Resp: 17  18 18   Temp: 97.6 F (36.4 C)  97.7 F (36.5 C) 97.7 F (36.5 C)  TempSrc:   Oral Oral  SpO2: 94%  99% 95%  Weight:  109.1 kg    Height:        General - Elderly obese Caucasian female, no acute distress. HEENT - PERRLA, EOMI, atraumatic head, non tender sinuses. Lung - distant breath sounds, diffuse rales, rhonchi, no wheezes. Heart - S1, S2 heard, no murmurs, rubs, 1+ pedal edema. Abdomen - Soft, non tender, bowel sounds good Neuro - Alert, awake and oriented x 3, non focal exam. Skin - Warm and dry.  Data Reviewed:      Latest Ref Rng & Units 03/24/2023    7:42 AM 03/23/2023   12:30 PM 12/20/2022   12:02 PM  CBC  WBC 4.0 - 10.5 K/uL 3.5  8.8  7.2   Hemoglobin 12.0 - 15.0 g/dL 08.6  57.8  46.9   Hematocrit 36.0 - 46.0 % 40.9  40.0  39.9   Platelets 150 - 400 K/uL 178  155  230       Latest Ref Rng & Units 03/27/2023    5:45 AM 03/26/2023   10:18 AM 03/25/2023    5:21 AM  BMP  Glucose 70 - 99 mg/dL 409  811  914   BUN 8 - 23 mg/dL 35  34  21   Creatinine 0.44 - 1.00 mg/dL 7.82  9.56  2.13   Sodium 135 - 145 mmol/L 131  131  129   Potassium 3.5 - 5.1 mmol/L 4.1  3.4  4.6   Chloride 98 - 111 mmol/L 93  94  95   CO2 22 - 32 mmol/L 28  27  23    Calcium 8.9 - 10.3 mg/dL 9.0  9.2  9.6    No results found.  Family  Communication: Discussed with patient, she understand and agree. All questions answereed.  Disposition: Status is: Inpatient Remains inpatient appropriate because: placement  Planned Discharge Destination: Skilled nursing facility     Time spent: 36 minutes  Author: Marcelino Duster, MD 03/29/2023 4:55 PM Secure chat 7am to 7pm For on call review [email or web address].

## 2023-03-30 ENCOUNTER — Other Ambulatory Visit: Payer: Medicare Other

## 2023-03-30 DIAGNOSIS — I5033 Acute on chronic diastolic (congestive) heart failure: Secondary | ICD-10-CM | POA: Diagnosis not present

## 2023-03-30 DIAGNOSIS — E871 Hypo-osmolality and hyponatremia: Secondary | ICD-10-CM | POA: Diagnosis not present

## 2023-03-30 DIAGNOSIS — J209 Acute bronchitis, unspecified: Secondary | ICD-10-CM | POA: Diagnosis not present

## 2023-03-30 DIAGNOSIS — R600 Localized edema: Secondary | ICD-10-CM | POA: Diagnosis not present

## 2023-03-30 LAB — GLUCOSE, CAPILLARY: Glucose-Capillary: 90 mg/dL (ref 70–99)

## 2023-03-30 MED ORDER — FLUTICASONE PROPIONATE 50 MCG/ACT NA SUSP
2.0000 | Freq: Every day | NASAL | Status: DC
  Administered 2023-03-30 – 2023-04-02 (×4): 2 via NASAL
  Filled 2023-03-30: qty 16

## 2023-03-30 MED ORDER — ACETAMINOPHEN 325 MG PO TABS
650.0000 mg | ORAL_TABLET | Freq: Four times a day (QID) | ORAL | Status: DC | PRN
  Administered 2023-03-30 – 2023-03-31 (×4): 650 mg via ORAL
  Filled 2023-03-30 (×4): qty 2

## 2023-03-30 NOTE — Plan of Care (Signed)

## 2023-03-30 NOTE — Progress Notes (Signed)
Occupational Therapy Treatment Patient Details Name: Anita Walker MRN: [id number] DOB: 1941/01/27 Today's Date: 03/30/2023   History of present illness presented to ER secondary to progressive weakness, cough , SOB; admitted for management of hyponatremia, LE edema.   OT comments  Chart reviewed, pt greeted in chair, agreeable to OT tx session targeting improving functional activity tolerance for ADL performance. Improvements noted in cognition with pt noted to be alert and oriented x4, does not have hearing aids and is extremely HOH. STS completed with MAX A +1-2 with RW 3 attempts. Pt able to sustain standing for approx 15 seconds however posterior lean noted throughout with step by step multi modal cues required for technique. SET UP required for grooming tasks. Pt is making progress towards goals, however continues to perform ADL below PLOF. OT will continue to follow.       If plan is discharge home, recommend the following:  A lot of help with walking and/or transfers;A lot of help with bathing/dressing/bathroom;Assistance with cooking/housework;Direct supervision/assist for medications management;Assist for transportation;Help with stairs or ramp for entrance;Direct supervision/assist for financial management   Equipment Recommendations  Other (comment) (defer)    Recommendations for Other Services      Precautions / Restrictions Precautions Precautions: Fall Restrictions Weight Bearing Restrictions Per Provider Order: No       Mobility Bed Mobility               General bed mobility comments: NT in recliner pre/post session    Transfers Overall transfer level: Needs assistance Equipment used: Rolling walker (2 wheels) Transfers: Sit to/from Stand Sit to Stand: Max assist (MAX A +1-2 with RW four attempts, step by step multi modal cues for technique; pt with posterior lean throughout)                 Balance Overall balance assessment: Needs  assistance Sitting-balance support: Feet supported, Bilateral upper extremity supported Sitting balance-Leahy Scale: Fair     Standing balance support: Bilateral upper extremity supported Standing balance-Leahy Scale: Poor                             ADL either performed or assessed with clinical judgement   ADL Overall ADL's : Needs assistance/impaired Eating/Feeding: Supervision/ safety;Sitting   Grooming: Wash/dry face;Sitting;Set up               Lower Body Dressing: Maximal assistance;Sitting/lateral leans       Toileting- Clothing Manipulation and Hygiene: Maximal assistance;Sit to/from stand              Extremity/Trunk Assessment              Vision       Perception     Praxis      Cognition Arousal: Alert Behavior During Therapy: WFL for tasks assessed/performed Overall Cognitive Status: Within Functional Limits for tasks assessed                                 General Comments: extremely HOH; alert and oriented x4 however        Exercises Other Exercises Other Exercises: edu re: role of OT, progressing mobility    Shoulder Instructions       General Comments      Pertinent Vitals/ Pain       Pain Assessment Pain Assessment: Faces Faces Pain Scale: Hurts even  more Pain Location: BLE with movement Pain Descriptors / Indicators: Discomfort Pain Intervention(s): Monitored during session, Repositioned, Limited activity within patient's tolerance  Home Living                                          Prior Functioning/Environment              Frequency  Min 1X/week        Progress Toward Goals  OT Goals(current goals can now be found in the care plan section)  Progress towards OT goals: Progressing toward goals  Acute Rehab OT Goals Time For Goal Achievement: 04/11/23  Plan      Co-evaluation                 AM-PAC OT "6 Clicks" Daily Activity     Outcome  Measure   Help from another person eating meals?: A Little Help from another person taking care of personal grooming?: A Little Help from another person toileting, which includes using toliet, bedpan, or urinal?: A Lot Help from another person bathing (including washing, rinsing, drying)?: A Lot Help from another person to put on and taking off regular upper body clothing?: A Little Help from another person to put on and taking off regular lower body clothing?: A Lot 6 Click Score: 15    End of Session Equipment Utilized During Treatment: Rolling walker (2 wheels);Gait belt  OT Visit Diagnosis: Other abnormalities of gait and mobility (R26.89);Muscle weakness (generalized) (M62.81);History of falling (Z91.81)   Activity Tolerance Patient tolerated treatment well   Patient Left in chair;with call bell/phone within reach;with chair alarm set   Nurse Communication Mobility status        Time: 1610-9604 OT Time Calculation (min): 20 min  Charges: OT General Charges $OT Visit: 1 Visit OT Treatments $Therapeutic Activity: 8-22 mins  Oleta Mouse, OTD OTR/L  03/30/23, 1:27 PM

## 2023-03-30 NOTE — Plan of Care (Signed)

## 2023-03-30 NOTE — Plan of Care (Signed)
  Problem: Skin Integrity: Goal: Risk for impaired skin integrity will decrease Outcome: Progressing   Problem: Safety: Goal: Ability to remain free from injury will improve Outcome: Progressing   Problem: Pain Managment: Goal: General experience of comfort will improve and/or be controlled Outcome: Progressing   Problem: Elimination: Goal: Will not experience complications related to bowel motility Outcome: Progressing   Problem: Nutrition: Goal: Adequate nutrition will be maintained Outcome: Progressing   Problem: Clinical Measurements: Goal: Cardiovascular complication will be avoided Outcome: Progressing   Problem: Health Behavior/Discharge Planning: Goal: Ability to manage health-related needs will improve Outcome: Progressing   Problem: Education: Goal: Knowledge of General Education information will improve Description: Including pain rating scale, medication(s)/side effects and non-pharmacologic comfort measures Outcome: Progressing

## 2023-03-30 NOTE — Progress Notes (Addendum)
Mobility Specialist - Progress Note   03/30/23 1214  Mobility  Activity Stood at bedside;Transferred from bed to chair  Level of Assistance Minimal assist, patient does 75% or more  Assistive Device Stedy  Activity Response Tolerated well  Mobility visit 1 Mobility  Mobility Specialist Start Time (ACUTE ONLY) 1111  Mobility Specialist Stop Time (ACUTE ONLY) 1133  Mobility Specialist Time Calculation (min) (ACUTE ONLY) 22 min   Pt supine upon entry, utilizing RA. Pt very HOH however agreeable to transfer to the recliner this date. Pt reported pain in BLE when touched anywhere below the knees. Pt completed bed mob MinA to bring trunk from sup to sit and bring BLE flat on ground--- Pt request MS to use/grab shoes when assisting transfer EOB. Pt STS MinA +2 and transfer to the recliner. Pt left seated in the recliner with alarm set and needs within reach. RN notified.  Zetta Bills Mobility Specialist 03/30/23 12:21 PM

## 2023-03-30 NOTE — Progress Notes (Signed)
Progress Note   Patient: Anita Walker WUJ:[id number] DOB: February 26, 1941 DOA: 03/23/2023     6 DOS: the patient was seen and examined on 03/30/2023   Brief hospital course: Anita Walker is a 83 y.o. female with medical history significant of hypertension, hyperlipidemia, diet-controlled diabetes, PE not on anticoagulants due to fall, anxiety, gastric ulcer, GERD, anemia, breast cancer (s/p of left lumpectomy, chemo and radiation therapy), who presented to the ED because of hallucinations, general weakness, cough and wheezing.  Reportedly, she fell accidentally 3 days prior to admission.   Assessment and Plan: Acute on chronic hyponatremia:  Sodium level stable at 131. Repeat labs for tomorrow ordered.  Cough, wheezing, suspected acute bronchitis. She has completed a course of steroids. Her symptoms improved. Advised out of bed, incentive spirometry.  Acute on chronic diastolic CHF:  Respiratory viral panel was negative.  CT chest showed findings suspicious for pulmonary edema. 2D echo showed EF estimated at 50 to 55%, mild concentric LVH, indeterminate LV Parameters, mild MR, mild to moderate TR BNP 147.2. Continue oral Lasix.  She seems euvolemic now. Monitor daily weights, strict input and output.   Bilateral leg edema: Improved.  Venous duplex of lower extremity was negative.   Patient also has chronic venous insufficiency and had some leg edema at baseline.   Hallucinations: Improved.  Exact etiology is unclear.  Probably from delirium from acute illness.  MRI brain did not show any evidence of acute stroke.   Vitamin B12 1,032   1.5 cm right thyroid nodule: Outpatient follow-up with thyroid ultrasound   General Weakness: TOC working on SNF placement. Encourage out of bed to chair.   Hypertension: Stable BP. Continue Amlodipine.  Hypercholesterolemia- On statin ( Zocor 10mg ).  Type II DM (recent A1c was 6),  Diet control. Hypoglycemia protocol.  History of atrial  fibrillation and pulmonary embolism not on anticoagulation because of high fall risk.  Remote history of breast cancer s/p lumpectomy and chemoradiation in 1992. Stable.  Anxiety- On buspar therapy.  Obesity- BMI 36.07 Diet, exercise and weight reduction advised.     Out of bed to chair. Incentive spirometry. Nursing supportive care. Fall, aspiration precautions. DVT prophylaxis   Code Status: Limited: Do not attempt resuscitation (DNR) -DNR-LIMITED -Do Not Intubate/DNI   Subjective: Patient is seen and examined today morning.  Poor historian due to hard of hearing.  Denies any pain, no overnight issues.  Physical Exam: Vitals:   03/30/23 0356 03/30/23 0500 03/30/23 0853 03/30/23 1247  BP: (!) 138/118  117/62 (!) 138/46  Pulse: 66  88 91  Resp: 18  19 19   Temp: 97.9 F (36.6 C)  97.7 F (36.5 C) 97.7 F (36.5 C)  TempSrc: Oral  Oral   SpO2: 95%  93% 93%  Weight:  109.8 kg    Height:        General - Elderly obese Caucasian female, no acute distress. HEENT - PERRLA, EOMI, atraumatic head, severe hard of hearing. Lung - distant breath sounds, diffuse rales, rhonchi, no wheezes. Heart - S1, S2 heard, no murmurs, rubs, 1+ pedal edema. Abdomen - Soft, non tender, bowel sounds good Neuro - Alert, awake and oriented x 3, non focal exam. Skin - Warm and dry.  Data Reviewed:      Latest Ref Rng & Units 03/24/2023    7:42 AM 03/23/2023   12:30 PM 12/20/2022   12:02 PM  CBC  WBC 4.0 - 10.5 K/uL 3.5  8.8  7.2  Hemoglobin 12.0 - 15.0 g/dL 16.1  09.6  04.5   Hematocrit 36.0 - 46.0 % 40.9  40.0  39.9   Platelets 150 - 400 K/uL 178  155  230       Latest Ref Rng & Units 03/27/2023    5:45 AM 03/26/2023   10:18 AM 03/25/2023    5:21 AM  BMP  Glucose 70 - 99 mg/dL 409  811  914   BUN 8 - 23 mg/dL 35  34  21   Creatinine 0.44 - 1.00 mg/dL 7.82  9.56  2.13   Sodium 135 - 145 mmol/L 131  131  129   Potassium 3.5 - 5.1 mmol/L 4.1  3.4  4.6   Chloride 98 - 111 mmol/L 93   94  95   CO2 22 - 32 mmol/L 28  27  23    Calcium 8.9 - 10.3 mg/dL 9.0  9.2  9.6    No results found.  Family Communication: Discussed with patient, she understand and agree. All questions answereed.  Disposition: Status is: Inpatient Remains inpatient appropriate because: placement  Planned Discharge Destination: Skilled nursing facility     Time spent: 36 minutes  Author: Marcelino Duster, MD 03/30/2023 3:46 PM Secure chat 7am to 7pm For on call review [email or web address].

## 2023-03-30 NOTE — Progress Notes (Signed)
Patient brought to unit on bed by transport, alert, oriented to call bell

## 2023-03-30 NOTE — Progress Notes (Signed)
PT Cancellation Note  Patient Details Name: Anita Walker MRN: [id number] DOB: [birth year]   Cancelled Treatment:     Pt transferred OOB to chair with Mobility Team this am, worked with OT shortly following, and was just returned to bed with Nursing. Pt fatigued and politely declined PT. Will re-attempt next available date/time per POC.   Jannet Askew 03/30/2023, 2:20 PM

## 2023-03-31 DIAGNOSIS — R600 Localized edema: Secondary | ICD-10-CM | POA: Diagnosis not present

## 2023-03-31 DIAGNOSIS — E871 Hypo-osmolality and hyponatremia: Secondary | ICD-10-CM | POA: Diagnosis not present

## 2023-03-31 DIAGNOSIS — J209 Acute bronchitis, unspecified: Secondary | ICD-10-CM | POA: Diagnosis not present

## 2023-03-31 DIAGNOSIS — I5033 Acute on chronic diastolic (congestive) heart failure: Secondary | ICD-10-CM | POA: Diagnosis not present

## 2023-03-31 LAB — BASIC METABOLIC PANEL
Anion gap: 11 (ref 5–15)
BUN: 30 mg/dL — ABNORMAL HIGH (ref 8–23)
CO2: 23 mmol/L (ref 22–32)
Calcium: 8.8 mg/dL — ABNORMAL LOW (ref 8.9–10.3)
Chloride: 93 mmol/L — ABNORMAL LOW (ref 98–111)
Creatinine, Ser: 0.69 mg/dL (ref 0.44–1.00)
GFR, Estimated: >60 mL/min (ref >60)
Glucose, Bld: 105 mg/dL — ABNORMAL HIGH (ref 70–99)
Potassium: 4.1 mmol/L (ref 3.5–5.1)
Sodium: 127 mmol/L — ABNORMAL LOW (ref 135–145)

## 2023-03-31 LAB — CBC
HCT: 38.5 % (ref 36.0–46.0)
Hemoglobin: 13.5 g/dL (ref 12.0–15.0)
MCH: 29.7 pg (ref 26.0–34.0)
MCHC: 35.1 g/dL (ref 30.0–36.0)
MCV: 84.6 fL (ref 80.0–100.0)
Platelets: 204 10*3/uL (ref 150–400)
RBC: 4.55 MIL/uL (ref 3.87–5.11)
RDW: 14.3 % (ref 11.5–15.5)
WBC: 5.8 10*3/uL (ref 4.0–10.5)
nRBC: 0 % (ref 0.0–0.2)

## 2023-03-31 LAB — GLUCOSE, CAPILLARY: Glucose-Capillary: 120 mg/dL — ABNORMAL HIGH (ref 70–99)

## 2023-03-31 NOTE — Progress Notes (Signed)
Progress Note   Patient: Anita Walker RUE:[id number] DOB: [birth year] DOA: 03/23/2023     7 DOS: the patient was seen and examined on 03/31/2023   Brief hospital course: Anita Walker is a 83 y.o. female with medical history significant of hypertension, hyperlipidemia, diet-controlled diabetes, PE not on anticoagulants due to fall, anxiety, gastric ulcer, GERD, anemia, breast cancer (s/p of left lumpectomy, chemo and radiation therapy), who presented to the ED because of hallucinations, general weakness, cough and wheezing.  Reportedly, she fell accidentally 3 days prior to admission.   Assessment and Plan: Acute on chronic hyponatremia:  In the setting of CHF. Sodium level stable at 127-131. Continue to trend sodium  Cough, wheezing, suspected acute bronchitis. She has completed a course of steroids. Her symptoms improved. Advised out of bed, incentive spirometry.  Acute on chronic diastolic CHF:  CT chest showed findings suspicious for pulmonary edema. 2D echo showed EF estimated at 50 to 55%, mild concentric LVH, indeterminate LV Parameters, mild MR, mild to moderate TR. Repeat limited echo for MV gradients. BNP 147.2. Continue oral Lasix if BP allows. Leg swelling persists. Monitor daily weights, strict input and output.   Bilateral leg edema: Improved.  Venous duplex of lower extremity was negative.  Patient also has chronic venous insufficiency and had some leg edema at baseline.   Hypotension: Hold Amlodipine. Will give Lasix if BP improved. Monitor bp closely, check orthostatic vitals.   Metabolic encephalopathy Hallucinations: Improved.  Exact etiology is unclear.  Probably from delirium from acute illness.  MRI brain did not show any evidence of acute stroke.   Vitamin B12 1,032   1.5 cm right thyroid nodule: Outpatient follow-up with thyroid ultrasound   General Weakness: TOC working on SNF placement. Encourage out of bed to chair.   Hypertension: Stable  BP. Continue Amlodipine.  Hypercholesterolemia- On statin ( Zocor 10mg ).  Type II DM (recent A1c was 6),  Diet control. Hypoglycemia protocol.  History of atrial fibrillation and pulmonary embolism not on anticoagulation because of high fall risk.  Remote history of breast cancer s/p lumpectomy and chemoradiation in 1992. Stable.  Anxiety- On buspar therapy.  Obesity- BMI 36.07 Diet, exercise and weight reduction advised.    Out of bed to chair. Incentive spirometry. Nursing supportive care. Fall, aspiration precautions. DVT prophylaxis   Code Status: Limited: Do not attempt resuscitation (DNR) -DNR-LIMITED -Do Not Intubate/DNI   Subjective: Patient is seen and examined today morning. She is weak, able to answer appropriately. Family at bedside.  Has hard of hearing.  On and off dizzy upon sitting in chair. Leg swelling persists.  Physical Exam: Vitals:   03/31/23 0345 03/31/23 0500 03/31/23 0833 03/31/23 1026  BP: 117/81  95/73 98/69  Pulse: 80  61 84  Resp: 20  16   Temp: 97.8 F (36.6 C)  97.9 F (36.6 C) 97.6 F (36.4 C)  TempSrc: Oral  Oral   SpO2: 91%  91% 95%  Weight:  111.3 kg    Height:        General - Elderly obese Caucasian female, no acute distress. HEENT - PERRLA, EOMI, atraumatic head, severe hard of hearing. Lung - distant breath sounds, diffuse rales, rhonchi, no wheezes. Heart - S1, S2 heard, no murmurs, rubs, 1+ pedal edema. Abdomen - Soft, non tender, bowel sounds good Neuro - Alert, awake and oriented x 3, non focal exam. Skin - Warm and dry.  Data Reviewed:      Latest Ref Rng &  Units 03/31/2023    4:45 AM 03/24/2023    7:42 AM 03/23/2023   12:30 PM  CBC  WBC 4.0 - 10.5 K/uL 5.8  3.5  8.8   Hemoglobin 12.0 - 15.0 g/dL 60.4  54.0  98.1   Hematocrit 36.0 - 46.0 % 38.5  40.9  40.0   Platelets 150 - 400 K/uL 204  178  155       Latest Ref Rng & Units 03/31/2023    4:45 AM 03/27/2023    5:45 AM 03/26/2023   10:18 AM  BMP  Glucose  70 - 99 mg/dL 191  478  295   BUN 8 - 23 mg/dL 30  35  34   Creatinine 0.44 - 1.00 mg/dL 6.21  3.08  6.57   Sodium 135 - 145 mmol/L 127  131  131   Potassium 3.5 - 5.1 mmol/L 4.1  4.1  3.4   Chloride 98 - 111 mmol/L 93  93  94   CO2 22 - 32 mmol/L 23  28  27    Calcium 8.9 - 10.3 mg/dL 8.8  9.0  9.2    No results found.  Family Communication: Discussed with patient's family at bedside, they understand and agree. All questions answereed.  Disposition: Status is: Inpatient Remains inpatient appropriate because: placement  Planned Discharge Destination: Skilled nursing facility     Time spent: 39 minutes  Author: Marcelino Duster, MD 03/31/2023 3:05 PM Secure chat 7am to 7pm For on call review [email or web address].

## 2023-03-31 NOTE — Plan of Care (Signed)

## 2023-04-01 DIAGNOSIS — E871 Hypo-osmolality and hyponatremia: Secondary | ICD-10-CM | POA: Diagnosis not present

## 2023-04-01 DIAGNOSIS — I5033 Acute on chronic diastolic (congestive) heart failure: Secondary | ICD-10-CM | POA: Diagnosis not present

## 2023-04-01 DIAGNOSIS — R600 Localized edema: Secondary | ICD-10-CM | POA: Diagnosis not present

## 2023-04-01 DIAGNOSIS — J209 Acute bronchitis, unspecified: Secondary | ICD-10-CM | POA: Diagnosis not present

## 2023-04-01 NOTE — Progress Notes (Signed)
Progress Note   Patient: Anita Walker [id number] DOB: 1940/03/11 DOA: 03/23/2023     8 DOS: the patient was seen and examined on 04/01/2023   Brief hospital course: Anita Walker is a 83 y.o. female with medical history significant of hypertension, hyperlipidemia, diet-controlled diabetes, PE not on anticoagulants due to fall, anxiety, gastric ulcer, GERD, anemia, breast cancer (s/p of left lumpectomy, chemo and radiation therapy), who presented to the ED because of hallucinations, general weakness, cough and wheezing.  Reportedly, she fell accidentally 3 days prior to admission.   Assessment and Plan: Acute on chronic hyponatremia:  In the setting of CHF. Sodium level stable at 127-131. Continue to trend sodium  Cough, wheezing, suspected acute bronchitis. She has completed a course of steroids. Her symptoms improved. Advised out of bed, incentive spirometry.  Acute on chronic diastolic CHF:  CT chest showed findings suspicious for pulmonary edema. 2D echo showed EF estimated at 50 to 55%, mild concentric LVH, indeterminate LV Parameters, mild MR, mild to moderate TR. Repeat limited echo for MV gradients. BNP 147.2. Continue oral Lasix if BP allows. Leg swelling persists. Monitor daily weights, strict input and output.   Bilateral leg edema: Improved.  Venous duplex of lower extremity was negative.  Patient also has chronic venous insufficiency and had some leg edema at baseline.   Hypotension: Held Amlodipine. BP improved. Continue Lasix. Monitor bp closely, check orthostatic vitals.   Metabolic encephalopathy Hallucinations: Improved.  Exact etiology is unclear.  Probably from delirium from acute illness.  MRI brain did not show any evidence of acute stroke.   Vitamin B12 1,032   1.5 cm right thyroid nodule: Outpatient follow-up with thyroid ultrasound   General Weakness: TOC working on SNF placement. Encourage out of bed to chair.   Hypertension: Stable  BP. Continue Amlodipine.  Hypercholesterolemia- On statin ( Zocor 10mg ).  Type II DM (recent A1c was 6),  Diet control. Hypoglycemia protocol.  History of atrial fibrillation and pulmonary embolism not on anticoagulation because of high fall risk.  Anxiety- On buspar therapy.  Obesity- BMI 36.07 Diet, exercise and weight reduction advised.    Out of bed to chair. Incentive spirometry. Nursing supportive care. Fall, aspiration precautions. DVT prophylaxis   Code Status: Limited: Do not attempt resuscitation (DNR) -DNR-LIMITED -Do Not Intubate/DNI   Subjective: Patient is seen and examined today morning. She is weak,asks for pillows under her legs. No family at bedside. Eating fair. No overnight issues.  Physical Exam: Vitals:   03/31/23 2110 04/01/23 0327 04/01/23 0500 04/01/23 0806  BP: 126/76 (!) 116/48  (!) 122/59  Pulse: 91 83  67  Resp: 15 19  15   Temp: 97.8 F (36.6 C) 99.1 F (37.3 C)  98.1 F (36.7 C)  TempSrc:  Oral    SpO2: 95% 94%  95%  Weight:   112.6 kg   Height:        General - Elderly obese Caucasian female, no acute distress. HEENT - PERRLA, EOMI, atraumatic head, severe hard of hearing. Lung - distant breath sounds, diffuse rales, rhonchi, no wheezes. Heart - S1, S2 heard, no murmurs, rubs, 1+ pedal edema. Abdomen - Soft, non tender, bowel sounds good Neuro - Alert, awake and oriented x 3, non focal exam. Skin - Warm and dry.  Data Reviewed:      Latest Ref Rng & Units 03/31/2023    4:45 AM 03/24/2023    7:42 AM 03/23/2023   12:30 PM  CBC  WBC 4.0 -  10.5 K/uL 5.8  3.5  8.8   Hemoglobin 12.0 - 15.0 g/dL 96.0  45.4  09.8   Hematocrit 36.0 - 46.0 % 38.5  40.9  40.0   Platelets 150 - 400 K/uL 204  178  155       Latest Ref Rng & Units 03/31/2023    4:45 AM 03/27/2023    5:45 AM 03/26/2023   10:18 AM  BMP  Glucose 70 - 99 mg/dL 119  147  829   BUN 8 - 23 mg/dL 30  35  34   Creatinine 0.44 - 1.00 mg/dL 5.62  1.30  8.65   Sodium 135 -  145 mmol/L 127  131  131   Potassium 3.5 - 5.1 mmol/L 4.1  4.1  3.4   Chloride 98 - 111 mmol/L 93  93  94   CO2 22 - 32 mmol/L 23  28  27    Calcium 8.9 - 10.3 mg/dL 8.8  9.0  9.2    No results found.  Family Communication: Discussed with patient, she understand and agree. All questions answereed.  Disposition: Status is: Inpatient Remains inpatient appropriate because: placement  Planned Discharge Destination: Skilled nursing facility     Time spent: 39 minutes  Author: Marcelino Duster, MD 04/01/2023 2:05 PM Secure chat 7am to 7pm For on call review [email or web address].

## 2023-04-01 NOTE — Progress Notes (Signed)
Physical Therapy Treatment Patient Details Name: Anita Walker MRN: [id number] DOB: [birth year] Today's Date: 04/01/2023   History of Present Illness presented to ER secondary to progressive weakness, cough , SOB; admitted for management of hyponatremia, LE edema.    PT Comments  Pt received in bed, family at bedside. Mod/MaxA to transfer to EOB with HOB raised and use of side rail. Pt sat EOB x 5 minutes with good sitting balance and supervision. Several sit to stand transfers at [facility] due to bowel incontinence. Pt requires Mod/MaxA to stand due to fear/anxiety of falling. Family states pt has a stand up recliner at home, however can stand from the toilet without difficulty. Pt completed short distance gait with increased time, side stepping to recliner with RW and heavy cues to reduce anxiety. Pt positioned to comfort in chair, all needs in reach. Continue PT per POC.    If plan is discharge home, recommend the following: A lot of help with walking and/or transfers;A lot of help with bathing/dressing/bathroom;Assist for transportation;Help with stairs or ramp for entrance   Can travel by private vehicle     No  Equipment Recommendations  Other (comment) (TBD at next level of care)    Recommendations for Other Services       Precautions / Restrictions Precautions Precautions: Fall Restrictions Weight Bearing Restrictions Per Provider Order: No     Mobility  Bed Mobility Overal bed mobility: Needs Assistance Bed Mobility: Supine to Sit     Supine to sit: Mod assist, Max assist     General bed mobility comments:  (Pt has a standing recliner at home)    Transfers Overall transfer level: Needs assistance Equipment used: Rolling walker (2 wheels) Transfers: Sit to/from Stand Sit to Stand: Max assist, From elevated surface           General transfer comment: Pt's anxiety from fear of falling limits mobility at times. If given clear cues and time to "right" self in standing,  she does well    Ambulation/Gait Ambulation/Gait assistance: Min assist Gait Distance (Feet): 3 Feet Assistive device: Rolling walker (2 wheels) Gait Pattern/deviations: Step-to pattern, Decreased dorsiflexion - right, Decreased dorsiflexion - left, Wide base of support Gait velocity: decr     General Gait Details: Modified gait bed to chair with RW. Good wt shifting with short steps and reliance on RW for balance.   Stairs             Wheelchair Mobility     Tilt Bed    Modified Rankin (Stroke Patients Only)       Balance Overall balance assessment: Needs assistance Sitting-balance support: Feet supported, Bilateral upper extremity supported Sitting balance-Leahy Scale: Fair Sitting balance - Comments: Sitting EOB x 5 minutes with supervision.   Standing balance support: Bilateral upper extremity supported, During functional activity, Reliant on assistive device for balance Standing balance-Leahy Scale: Fair                              Cognition Arousal: Alert Behavior During Therapy: WFL for tasks assessed/performed Overall Cognitive Status: Within Functional Limits for tasks assessed                                 General Comments: extremely HOH; alert and oriented x4 however        Exercises      General Comments  General comments (skin integrity, edema, etc.): B LE's very sensative to light touch      Pertinent Vitals/Pain Pain Assessment Pain Assessment: Faces Faces Pain Scale: Hurts little more Pain Location: BLE with movement Pain Descriptors / Indicators: Discomfort Pain Intervention(s): Monitored during session    Home Living                          Prior Function            PT Goals (current goals can now be found in the care plan section) Acute Rehab PT Goals Patient Stated Goal: to get strength back in my legs Progress towards PT goals: Progressing toward goals    Frequency    Min  1X/week      PT Plan      Co-evaluation              AM-PAC PT "6 Clicks" Mobility   Outcome Measure  Help needed turning from your back to your side while in a flat bed without using bedrails?: A Lot Help needed moving from lying on your back to sitting on the side of a flat bed without using bedrails?: A Lot Help needed moving to and from a bed to a chair (including a wheelchair)?: A Lot Help needed standing up from a chair using your arms (e.g., wheelchair or bedside chair)?: A Lot Help needed to walk in hospital room?: A Lot Help needed climbing 3-5 steps with a railing? : Total 6 Click Score: 11    End of Session Equipment Utilized During Treatment: Gait belt Activity Tolerance: Patient tolerated treatment well Patient left: in chair;with call bell/phone within reach;with chair alarm set;with family/visitor present Nurse Communication: Mobility status;Other (comment) PT Visit Diagnosis: Muscle weakness (generalized) (M62.81);Difficulty in walking, not elsewhere classified (R26.2)     Time: 0454-0981 PT Time Calculation (min) (ACUTE ONLY): 21 min  Charges:    $Therapeutic Activity: 8-22 mins PT General Charges $$ ACUTE PT VISIT: 1 Visit                    Zadie Cleverly, PTA  Anita Walker 04/01/2023, 3:01 PM

## 2023-04-01 NOTE — Plan of Care (Signed)

## 2023-04-02 ENCOUNTER — Encounter: Payer: Self-pay | Admitting: Internal Medicine

## 2023-04-02 DIAGNOSIS — I5020 Unspecified systolic (congestive) heart failure: Secondary | ICD-10-CM | POA: Diagnosis not present

## 2023-04-02 DIAGNOSIS — M6281 Muscle weakness (generalized): Secondary | ICD-10-CM | POA: Diagnosis not present

## 2023-04-02 DIAGNOSIS — E871 Hypo-osmolality and hyponatremia: Secondary | ICD-10-CM | POA: Diagnosis not present

## 2023-04-02 DIAGNOSIS — R278 Other lack of coordination: Secondary | ICD-10-CM | POA: Diagnosis not present

## 2023-04-02 DIAGNOSIS — K219 Gastro-esophageal reflux disease without esophagitis: Secondary | ICD-10-CM | POA: Diagnosis not present

## 2023-04-02 DIAGNOSIS — E1142 Type 2 diabetes mellitus with diabetic polyneuropathy: Secondary | ICD-10-CM | POA: Diagnosis not present

## 2023-04-02 DIAGNOSIS — R2689 Other abnormalities of gait and mobility: Secondary | ICD-10-CM | POA: Diagnosis not present

## 2023-04-02 DIAGNOSIS — E669 Obesity, unspecified: Secondary | ICD-10-CM | POA: Diagnosis not present

## 2023-04-02 DIAGNOSIS — G4733 Obstructive sleep apnea (adult) (pediatric): Secondary | ICD-10-CM | POA: Diagnosis not present

## 2023-04-02 DIAGNOSIS — Z853 Personal history of malignant neoplasm of breast: Secondary | ICD-10-CM | POA: Diagnosis not present

## 2023-04-02 DIAGNOSIS — F331 Major depressive disorder, recurrent, moderate: Secondary | ICD-10-CM | POA: Diagnosis not present

## 2023-04-02 DIAGNOSIS — Z6835 Body mass index (BMI) 35.0-35.9, adult: Secondary | ICD-10-CM | POA: Diagnosis not present

## 2023-04-02 DIAGNOSIS — G9341 Metabolic encephalopathy: Secondary | ICD-10-CM | POA: Diagnosis not present

## 2023-04-02 DIAGNOSIS — E1149 Type 2 diabetes mellitus with other diabetic neurological complication: Secondary | ICD-10-CM | POA: Diagnosis not present

## 2023-04-02 DIAGNOSIS — E7800 Pure hypercholesterolemia, unspecified: Secondary | ICD-10-CM | POA: Diagnosis not present

## 2023-04-02 DIAGNOSIS — Z86711 Personal history of pulmonary embolism: Secondary | ICD-10-CM | POA: Diagnosis not present

## 2023-04-02 DIAGNOSIS — E66812 Obesity, class 2: Secondary | ICD-10-CM | POA: Diagnosis not present

## 2023-04-02 DIAGNOSIS — Z9181 History of falling: Secondary | ICD-10-CM | POA: Diagnosis not present

## 2023-04-02 DIAGNOSIS — Z743 Need for continuous supervision: Secondary | ICD-10-CM | POA: Diagnosis not present

## 2023-04-02 DIAGNOSIS — F411 Generalized anxiety disorder: Secondary | ICD-10-CM | POA: Diagnosis not present

## 2023-04-02 DIAGNOSIS — I4891 Unspecified atrial fibrillation: Secondary | ICD-10-CM | POA: Diagnosis not present

## 2023-04-02 DIAGNOSIS — F419 Anxiety disorder, unspecified: Secondary | ICD-10-CM | POA: Diagnosis not present

## 2023-04-02 DIAGNOSIS — M7989 Other specified soft tissue disorders: Secondary | ICD-10-CM | POA: Diagnosis not present

## 2023-04-02 DIAGNOSIS — R0602 Shortness of breath: Secondary | ICD-10-CM | POA: Diagnosis not present

## 2023-04-02 DIAGNOSIS — Z7901 Long term (current) use of anticoagulants: Secondary | ICD-10-CM | POA: Diagnosis not present

## 2023-04-02 DIAGNOSIS — I10 Essential (primary) hypertension: Secondary | ICD-10-CM | POA: Diagnosis not present

## 2023-04-02 DIAGNOSIS — F4329 Adjustment disorder with other symptoms: Secondary | ICD-10-CM | POA: Diagnosis not present

## 2023-04-02 DIAGNOSIS — I959 Hypotension, unspecified: Secondary | ICD-10-CM | POA: Diagnosis not present

## 2023-04-02 DIAGNOSIS — M4310 Spondylolisthesis, site unspecified: Secondary | ICD-10-CM | POA: Diagnosis not present

## 2023-04-02 DIAGNOSIS — M545 Low back pain, unspecified: Secondary | ICD-10-CM | POA: Diagnosis not present

## 2023-04-02 DIAGNOSIS — E119 Type 2 diabetes mellitus without complications: Secondary | ICD-10-CM | POA: Diagnosis not present

## 2023-04-02 DIAGNOSIS — I5022 Chronic systolic (congestive) heart failure: Secondary | ICD-10-CM | POA: Diagnosis not present

## 2023-04-02 DIAGNOSIS — J209 Acute bronchitis, unspecified: Secondary | ICD-10-CM | POA: Diagnosis not present

## 2023-04-02 DIAGNOSIS — E66811 Obesity, class 1: Secondary | ICD-10-CM | POA: Diagnosis not present

## 2023-04-02 DIAGNOSIS — E041 Nontoxic single thyroid nodule: Secondary | ICD-10-CM | POA: Diagnosis not present

## 2023-04-02 DIAGNOSIS — G8929 Other chronic pain: Secondary | ICD-10-CM | POA: Diagnosis not present

## 2023-04-02 DIAGNOSIS — I5033 Acute on chronic diastolic (congestive) heart failure: Secondary | ICD-10-CM | POA: Diagnosis not present

## 2023-04-02 DIAGNOSIS — J439 Emphysema, unspecified: Secondary | ICD-10-CM | POA: Diagnosis not present

## 2023-04-02 DIAGNOSIS — R600 Localized edema: Secondary | ICD-10-CM | POA: Diagnosis not present

## 2023-04-02 LAB — SODIUM: Sodium: 132 mmol/L — ABNORMAL LOW (ref 135–145)

## 2023-04-02 MED ORDER — ASPIRIN 81 MG PO TBEC: 81.0000 mg | DELAYED_RELEASE_TABLET | Freq: Every day | ORAL | 12 refills | Status: DC

## 2023-04-02 MED ORDER — LOSARTAN POTASSIUM 25 MG PO TABS: 25.0000 mg | ORAL_TABLET | Freq: Every day | ORAL | 2 refills | Status: DC

## 2023-04-02 MED ORDER — ALUM & MAG HYDROXIDE-SIMETH 200-200-20 MG/5ML PO SUSP: 15.0000 mL | Freq: Four times a day (QID) | ORAL | 0 refills | Status: AC | PRN

## 2023-04-02 MED ORDER — FUROSEMIDE 20 MG PO TABS: ORAL_TABLET | ORAL | 1 refills | Status: DC

## 2023-04-02 NOTE — Progress Notes (Signed)
Physical Therapy Treatment Patient Details Name: Anita Walker MRN: [id number] DOB: Jul 17, 1940 Today's Date: 04/02/2023   History of Present Illness presented to ER secondary to progressive weakness, cough , SOB; admitted for management of hyponatremia, LE edema.    PT Comments  Pt receiving assistance from Mobility Team upon arrival for assistance with hygiene and bed mobility. MT stayed with therapist to progress functional mobility and gait training due to pt's high anxiety level and fear of falling which is slowly decreasing each session. Pt demonstrated sit>stand from elevated bed with MaxAx1 to RW. Initial assist to help pt steady in upright standing.Great progression with gait training in room with RW 18'x1 followed closely with chair for pt confidence. No LOB or significant fatigue noted. Short shuffling steps and cues for technique. Overall great progress this date. Pt awaiting transition to STR prior to returning home with family.    If plan is discharge home, recommend the following: A lot of help with walking and/or transfers;A lot of help with bathing/dressing/bathroom;Assist for transportation;Help with stairs or ramp for entrance   Can travel by private vehicle     No  Equipment Recommendations  Other (comment) (TBD at next level of care)    Recommendations for Other Services       Precautions / Restrictions Precautions Precautions: Fall Restrictions Weight Bearing Restrictions Per Provider Order: No     Mobility  Bed Mobility Overal bed mobility: Needs Assistance Bed Mobility: Supine to Sit     Supine to sit: Mod assist, HOB elevated, Used rails     General bed mobility comments: PT improving with bed mobility requiring less assistance from staff    Transfers Overall transfer level: Needs assistance Equipment used: Rolling walker (2 wheels) Transfers: Sit to/from Stand Sit to Stand: Max assist, From elevated surface           General transfer  comment: Pt's anxiety from fear of falling limits mobility at times. If given clear cues and time to "right" self in standing, she does well.    Ambulation/Gait Ambulation/Gait assistance: Min assist, Contact guard assist Gait Distance (Feet): 18 Feet Assistive device: Rolling walker (2 wheels) Gait Pattern/deviations: Step-to pattern, Decreased dorsiflexion - right, Decreased dorsiflexion - left, Wide base of support Gait velocity: decr     General Gait Details: Good progress following with chair and +2 assist for pt comfort   Stairs             Wheelchair Mobility     Tilt Bed    Modified Rankin (Stroke Patients Only)       Balance Overall balance assessment: Needs assistance Sitting-balance support: Feet supported, Bilateral upper extremity supported Sitting balance-Leahy Scale: Fair Sitting balance - Comments: Sitting EOB for several  minutes with supervision.   Standing balance support: Bilateral upper extremity supported, During functional activity, Reliant on assistive device for balance Standing balance-Leahy Scale: Fair Standing balance comment: 2 people present for dynamic mobility and RW to decrease pt's anxiety and fear of falling                            Cognition Arousal: Alert Behavior During Therapy: WFL for tasks assessed/performed Overall Cognitive Status: Within Functional Limits for tasks assessed                                 General Comments: extremely HOH; alert and  oriented x4 however        Exercises      General Comments General comments (skin integrity, edema, etc.): Education provided on safe gait training in room and encouragement on functional progression      Pertinent Vitals/Pain Pain Assessment Pain Assessment: Faces Faces Pain Scale: Hurts a little bit Pain Location: BLE with movement Pain Descriptors / Indicators: Discomfort Pain Intervention(s): Monitored during session    Home Living                           Prior Function            PT Goals (current goals can now be found in the care plan section) Acute Rehab PT Goals Patient Stated Goal: to get strength back in my legs Progress towards PT goals: Progressing toward goals    Frequency    Min 1X/week      PT Plan      Co-evaluation              AM-PAC PT "6 Clicks" Mobility   Outcome Measure  Help needed turning from your back to your side while in a flat bed without using bedrails?: A Lot Help needed moving from lying on your back to sitting on the side of a flat bed without using bedrails?: A Lot Help needed moving to and from a bed to a chair (including a wheelchair)?: A Lot Help needed standing up from a chair using your arms (e.g., wheelchair or bedside chair)?: A Lot Help needed to walk in hospital room?: A Lot Help needed climbing 3-5 steps with a railing? : Total 6 Click Score: 11    End of Session Equipment Utilized During Treatment: Gait belt Activity Tolerance: Patient tolerated treatment well Patient left: in chair;with call bell/phone within reach;with chair alarm set;with family/visitor present Nurse Communication: Mobility status PT Visit Diagnosis: Muscle weakness (generalized) (M62.81);Difficulty in walking, not elsewhere classified (R26.2)     Time: 4098-1191 PT Time Calculation (min) (ACUTE ONLY): 20 min  Charges:    $Gait Training: 8-22 mins PT General Charges $$ ACUTE PT VISIT: 1 Visit                    Zadie Cleverly, PTA  Jannet Askew 04/02/2023, 1:16 PM

## 2023-04-02 NOTE — Consult Note (Signed)
[facility] Liaison Note  04/02/2023  Anita Walker 1940/07/06 [id number]  Location: RN Hospital Liaison screened the patient remotely at [facility].  Insurance: Medicare   Anita Walker is a 83 y.o. female who is a Primary Care Patient of Dale Macoupin, MD [facility]. The patient was screened for readmission hospitalization with noted high risk score for unplanned readmission risk with 1 IP/3 ED in 6 months.  The patient was assessed for potential Care Management service needs for post hospital transition for care coordination. Review of patient's electronic medical record reveals patient was admitted with Hyponatremia. Recommended for SNF level of care for ongoing STR [facility]).  Liaison will make a referral for PAC-RN to follow at this networking facility.   Plan: [facility] Liaison will continue to follow progress and disposition to asess for post hospital community care coordination/management needs.  Referral request for community care coordination: Liaison will make a referral for PAC-RN with VBCI services post SNF discharge if needed.   VBCI Care Management/Population Health does not replace or interfere with any arrangements made by the Inpatient Transition of Care team.   For questions contact:   Elliot Cousin, RN, [facility] Liaison [place]   [facility], Population Health Office Hours MTWF  8:00 am-6:00 pm Direct Dial: [phone] mobile [phone] [Office toll free line] Office Hours are M-F 8:30 - 5 pm [email or web address]

## 2023-04-02 NOTE — Progress Notes (Signed)
Mobility Specialist - Progress Note   04/02/23 1608  Mobility  Activity Dangled on edge of bed;Turned to right side;Turned to left side  Level of Assistance Contact guard assist, steadying assist (HHA)  Assistive Device Other (Comment) (bed railing)  Activity Response Tolerated well  Mobility visit 1 Mobility  Mobility Specialist Start Time (ACUTE ONLY) 1131  Mobility Specialist Stop Time (ACUTE ONLY) 1201  Mobility Specialist Time Calculation (min) (ACUTE ONLY) 30 min   Pt supine upon entry, utilizing RA. Pt agreeable to transfer to the recliner this date-- denying use of stedy, opting to transfer without. Pt reported being "wet" d/t removal of purewick, rolled L and R MinA while MS completed peri care/ lien change. Pt completed bed mob HHA to bring trunk from sup to sit, min cuing for hand placement to utilize bed railing. PT enters and acquires session, MS assist PT.  Zetta Bills Mobility Specialist 04/02/23 4:13 PM

## 2023-04-02 NOTE — Plan of Care (Signed)
  Problem: Education: Goal: Knowledge of General Education information will improve Description: Including pain rating scale, medication(s)/side effects and non-pharmacologic comfort measures Outcome: Progressing   Problem: Clinical Measurements: Goal: Will remain free from infection Outcome: Progressing   Problem: Activity: Goal: Risk for activity intolerance will decrease Outcome: Progressing   Problem: Nutrition: Goal: Adequate nutrition will be maintained Outcome: Progressing   Problem: Elimination: Goal: Will not experience complications related to urinary retention Outcome: Progressing   Problem: Pain Managment: Goal: General experience of comfort will improve and/or be controlled Outcome: Progressing   Problem: Safety: Goal: Ability to remain free from injury will improve Outcome: Progressing   Problem: Skin Integrity: Goal: Risk for impaired skin integrity will decrease Outcome: Progressing

## 2023-04-02 NOTE — TOC Transition Note (Signed)
Transition of Care [facility]) - Discharge Note   Patient Details  Name: [name] MRN: [id number] Date of Birth: 10-24-1940  [facility] CM/SW Contact:  Allena Katz, LCSW Phone Number: 04/02/2023, 2:52 PM   Clinical Narrative:  Daughter accepted bed at [facility]. DC summary to be sent once in. RN given number for report medical necessity printed to unit.     Final next level of care: Skilled Nursing Facility Barriers to Discharge: Barriers Resolved   Patient Goals and CMS Choice   CMS Medicare.gov Compare Post Acute Care list provided to:: Patient Represenative (must comment) (daughter joanna)        Discharge Placement              Patient chooses bed at: [facility] Patient to be transferred to facility by: [facility] Name of family member notified: Mardene Celeste Patient and family notified of of transfer: 04/02/23  Discharge Plan and Services Additional resources added to the After Visit Summary for       Post Acute Care Choice: Skilled Nursing Facility                               Social Drivers of Health (SDOH) Interventions SDOH Screenings   Food Insecurity: No Food Insecurity (03/26/2023)  Housing: Low Risk  (03/26/2023)  Transportation Needs: No Transportation Needs (03/26/2023)  Utilities: Not At Risk (03/26/2023)  Depression (PHQ2-9): Low Risk  (05/05/2022)  Financial Resource Strain: Low Risk  (12/22/2022)  Physical Activity: Inactive (12/22/2022)  Social Connections: Socially Isolated (03/26/2023)  Stress: No Stress Concern Present (12/22/2022)  Tobacco Use: Low Risk  (03/26/2023)     Readmission Risk Interventions     No data to display

## 2023-04-02 NOTE — TOC Initial Note (Signed)
Transition of Care [facility]) - Initial/Assessment Note    Patient Details  Name: Anita Walker MRN: [id number] Date of Birth: June 30, 1940  [facility]) CM/SW Contact:    Allena Katz, LCSW Phone Number: 04/02/2023, 12:17 PM  Clinical Narrative:    CSW spoke with patients daughter as pt is not axo4. Daughter reports her top prefence is [facility] and she wants a semi private room. She states that her mom lives with her and was walking with a walker prior to admission. CSW to start workup.                Expected Discharge Plan: Skilled Nursing Facility Barriers to Discharge: Continued Medical Work up   Patient Goals and CMS Choice   CMS Medicare.gov Compare Post Acute Care list provided to:: Patient Represenative (must comment) (daughter [place])        Expected Discharge Plan and Services     Post Acute Care Choice: Skilled Nursing Facility Living arrangements for the past 2 months: Single Family Home                                      Prior Living Arrangements/Services Living arrangements for the past 2 months: Single Family Home Lives with:: Adult Children Patient language and need for interpreter reviewed:: Yes Do you feel safe going back to the place where you live?: Yes      Need for Family Participation in Patient Care: Yes (Comment)   Current home services: DME    Activities of Daily Living   ADL Screening (condition at time of admission) Independently performs ADLs?: No Does the patient have a NEW difficulty with bathing/dressing/toileting/self-feeding that is expected to last >3 days?: No Does the patient have a NEW difficulty with getting in/out of bed, walking, or climbing stairs that is expected to last >3 days?: No Does the patient have a NEW difficulty with communication that is expected to last >3 days?: No Is the patient deaf or have difficulty hearing?: No Does the patient have difficulty seeing, even when wearing  glasses/contacts?: No Does the patient have difficulty concentrating, remembering, or making decisions?: No  Permission Sought/Granted                  Emotional Assessment       Orientation: : Oriented to Self, Oriented to Place, Oriented to  Time      Admission diagnosis:  Hyponatremia [E87.1] Weakness [R53.1] Altered mental status, unspecified altered mental status type [R41.82] Patient Active Problem List   Diagnosis Date Noted   Localized swelling of both lower extremities 03/25/2023   Acute on chronic heart failure with preserved ejection fraction (HFpEF) (HCC) 03/25/2023   Acute bronchitis 03/24/2023   Hallucination 03/23/2023   Obesity (BMI 30-39.9) 03/23/2023   Bilateral leg edema 03/19/2023   Daytime sleepiness 02/17/2023   Anxiety 12/24/2022   Nocturia 12/09/2022   Hyponatremia 12/05/2022   Urinary hesitancy 11/03/2022   Urinary urgency 08/16/2022   Hearing loss 04/26/2022   Congestion of nasal sinus 04/26/2022   Recurrent falls 07/08/2021   Weakness of both lower extremities 07/08/2021   Closed superior and inferior pubic rami fractures, sequela (Right) 12/15/2020   Low back strain 11/15/2020   Lumbar spondylosis 11/15/2020   History of pelvic fracture 07/04/2020   Anemia 07/04/2020   Cerebral atrophy (HCC) 03/20/2020   Chronic low back pain (Midline) w/o  sciatica 01/26/2020   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   Kissing spine syndrome (L3-S1) 01/19/2020   Grade 1 Retrolisthesis of L3/L4 12/18/2019   Lip lesion 12/14/2019   Acute exacerbation of chronic low back pain 12/11/2019   At high risk for falls 12/11/2019   Fall at home, sequela 12/11/2019   Head injury 12/02/2019   Bradycardia following surgery 11/18/2019   Wrist fracture 11/02/2019   Weakness 11/02/2019   Change in bowel movement 11/02/2019   DDD (degenerative disc disease), lumbosacral 09/18/2019   Irritability and anger 09/18/2019   Nausea without vomiting 09/18/2019   History  of vasovagal symptom 08/14/2019    Class: History of   Polyp of descending colon    Positive colorectal cancer screening using Cologuard test 05/25/2019   Spondylosis without myelopathy or radiculopathy, lumbosacral region 03/11/2019   Chronic 50% compression fracture of L3 lumbar vertebra, sequela, w/ retropulsion 02/18/2019   Compression fracture of thoracolumbar vertebra (T11), sequela 02/18/2019   Lumbar facet arthropathy (Multilevel) 02/18/2019   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, 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) 02/18/2019   L5-S1 disc-osteophyte complex (Right) 02/18/2019   Lumbar facet syndrome (Bilateral) (R>L) 02/18/2019   Chronic anticoagulation (Coumadin) 02/18/2019   Chronic low back pain (Bilateral) (R>L) w/o sciatica 02/18/2019   Chronic pain syndrome 02/18/2019   Compression fracture of thoracic vertebra (HCC) 10/12/2018   Pain due to onychomycosis of toenails of both feet 09/02/2018   Constipation 08/18/2018   Fall 08/18/2018   Diabetic polyneuropathy associated with type 2 diabetes mellitus (HCC) 03/18/2017   Abnormal chest CT 03/18/2017   Urinary frequency 01/21/2017   Syncope 11/10/2016   Right hip pain 10/30/2016   Cough 10/25/2016   Bronchitis 04/16/2015   Health care maintenance 05/23/2014   Mixed emotional features as adjustment reaction 07/20/2013   Long term current use of anticoagulant therapy 04/11/2013   Hypertension 01/01/2012   Hypercholesteremia 01/01/2012   History of breast cancer 01/01/2012   Essential hypertension 01/01/2012   Pure hypercholesterolemia 01/01/2012   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   PCP:  Dale Hustisford, MD Pharmacy:   [facility] [phone] Dan Humphreys, [place] - [address] AT [facility] [address] [place] [address] Phone: [phone] Fax: [phone]     Social Drivers of Health (SDOH) Social History: SDOH Screenings   Food Insecurity: No Food  Insecurity (03/26/2023)  Housing: Low Risk  (03/26/2023)  Transportation Needs: No Transportation Needs (03/26/2023)  Utilities: Not At Risk (03/26/2023)  Depression (PHQ2-9): Low Risk  (05/05/2022)  Financial Resource Strain: Low Risk  (12/22/2022)  Physical Activity: Inactive (12/22/2022)  Social Connections: Socially Isolated (03/26/2023)  Stress: No Stress Concern Present (12/22/2022)  Tobacco Use: Low Risk  (03/26/2023)   SDOH Interventions:     Readmission Risk Interventions     No data to display

## 2023-04-02 NOTE — Discharge Summary (Signed)
Physician Discharge Summary   Patient: Anita Walker MRN: [id number] DOB: [birth year]  Admit date:     03/23/2023  Discharge date: 04/02/23  Discharge Physician: Marcelino Duster   PCP: Dale [place], MD   Recommendations at discharge:    PCP follow up in 1 week.  Discharge Diagnoses: Principal Problem:   Hyponatremia Active Problems:   Bilateral leg edema   Acute bronchitis   Hypertension   Hypercholesteremia   Type 2 diabetes mellitus with neurological complications (HCC)   Mixed emotional features as adjustment reaction   Anxiety   Hallucination   Obesity (BMI 30-39.9)   Acute on chronic heart failure with preserved ejection fraction (HFpEF) (HCC)  Resolved Problems:   * No resolved hospital problems. *  Hospital Course: Anita Walker is a 83 y.o. female with medical history significant of hypertension, hyperlipidemia, diet-controlled diabetes, PE not on anticoagulants due to fall, anxiety, gastric ulcer, GERD, anemia, breast cancer (s/p of left lumpectomy, chemo and radiation therapy), who presented to the ED because of hallucinations, general weakness, cough and wheezing. Reportedly, she fell accidentally 3 days prior to admission.   Patient is admitted to the hospitalist service with impression of metabolic encephalopathy, acute bronchitis, acute on chronic hyponatremia, acute on chronic diastolic CHF.  Patient got treated with steroids, IV Lasix.  Patient's mental status much improved, sodium remained stable around 127-132.  Her symptoms of cough, wheezing much better.  She had echocardiogram which showed EF of 50 to 55%, recommended repeat echo for MV gradients.  Her blood sugars, blood pressures closely monitored.  Decreased losartan from 100 mg daily to 25 mg given borderline low blood pressures.  Patient is evaluated by physical therapy who recommended skilled nursing facility placement given her weakness, frequent falls.  She remained hemodynamically stable to be  discharged to skilled nursing facility.  Outpatient follow-up with PCP in 1 week suggested.  Assessment and Plan: Acute on chronic hyponatremia:  In the setting of CHF. Sodium level stable at 127-132. Outpatient follow up to trend electrolytes.   Cough, wheezing, suspected acute bronchitis. She has completed a course of steroids. Her symptoms improved. Advised out of bed, incentive spirometry.   Acute on chronic diastolic CHF:  CT chest showed findings suspicious for pulmonary edema. 2D echo showed EF estimated at 50 to 55%, mild concentric LVH, indeterminate LV Parameters, mild MR, mild to moderate TR.  Continue oral Lasix 20mg  daily. Elevate lower extremities. Monitor daily weights, strict input and output. Repeat Echo for MV gradients as outpatient.   Bilateral leg edema: Improved. Venous duplex of lower extremity was negative. Patient also has chronic venous insufficiency and had some leg edema at baseline. Continue daily Lasix regimen.   Metabolic encephalopathy Hallucinations: Improved.  Exact etiology is unclear.  Probably from delirium from acute illness.  MRI brain did not show any evidence of acute stroke.    Hypertension: Stable BP. Continue Amlodipine 2.5 mg, Losartan dose decreased to 25 mg daily.   Hypercholesterolemia- continue Zocor 10 mg.   Type II DM (recent A1c was 6),  Diet control. Continue hypoglycemia protocol.   History of atrial fibrillation and pulmonary embolism not on anticoagulation because of high fall risk.   Anxiety- On buspar therapy.  Generalized weakness: Continue PT at SNF.  1.5 cm right thyroid nodule: Outpatient follow-up with thyroid ultrasound   Obesity- BMI 36.07 Diet, exercise and weight reduction advised.        Consultants: none Procedures performed: none  Disposition: Skilled nursing  facility Diet recommendation:  Discharge Diet Orders (From admission, onward)     Start     Ordered   04/02/23 0000  Diet - low  sodium heart healthy        04/02/23 1450           Cardiac and Carb modified diet DISCHARGE MEDICATION: Allergies as of 04/02/2023       Reactions   Penicillins Other (See Comments)   Unknown- pt states been a long time ago  Has patient had a PCN reaction causing immediate rash, facial/tongue/throat swelling, SOB or lightheadedness with hypotension: Unknown Has patient had a PCN reaction causing severe rash involving mucus membranes or skin necrosis: Unknown Has patient had a PCN reaction that required hospitalization: Unknown Has patient had a PCN reaction occurring within the last 10 years: Unknown If all of the above answers are "NO", then may proceed with Cephalosporin use.   Penicillin V Potassium Nausea And Vomiting        Medication List     TAKE these medications    albuterol 108 (90 Base) MCG/ACT inhaler Commonly known as: VENTOLIN HFA Inhale 1-2 puffs into the lungs every 6 (six) hours as needed for wheezing.   alum & mag hydroxide-simeth 200-200-20 MG/5ML suspension Commonly known as: MAALOX/MYLANTA Take 15 mLs by mouth every 6 (six) hours as needed for indigestion or heartburn.   amLODipine 2.5 MG tablet Commonly known as: NORVASC Take 1 tablet (2.5 mg total) by mouth daily.   aspirin EC 81 MG tablet Take 1 tablet (81 mg total) by mouth daily. Swallow whole. Start taking on: April 03, 2023   busPIRone 5 MG tablet Commonly known as: BUSPAR TAKE 1 TABLET(5 MG) BY MOUTH DAILY   Contour Test test strip Generic drug: glucose blood USE TO TEST BLOOD SUGAR ONCE DAILY   diphenhydrAMINE 25 mg capsule Commonly known as: BENADRYL Take 25 mg at bedtime as needed by mouth for sleep.   feeding supplement Liqd Take 237 mLs by mouth 2 (two) times daily between meals.   ferrous sulfate 325 (65 FE) MG tablet Commonly known as: FeroSul Take 1 tablet (325 mg total) by mouth every other day. One tablet qod   furosemide 20 MG tablet Commonly known as:  LASIX 1 tab by mouth daily for 3 days then 1 tab every other day for 1 week.   losartan 25 MG tablet Commonly known as: Cozaar Take 1 tablet (25 mg total) by mouth daily. What changed:  medication strength how much to take how to take this when to take this additional instructions   multivitamin with minerals Tabs tablet Take 1 tablet daily by mouth.   mupirocin ointment 2 % Commonly known as: BACTROBAN Apply 1 Application topically 2 (two) times daily.   pantoprazole 40 MG tablet Commonly known as: PROTONIX TAKE 1 TABLET(40 MG) BY MOUTH DAILY   simvastatin 10 MG tablet Commonly known as: ZOCOR Take 1 tablet (10 mg total) by mouth daily.        Contact information for follow-up providers     Dale [place], MD Follow up.   Specialty: Internal Medicine Why: Hospital follow up Contact information: [address] [place] [address] [phone]              Contact information for after-discharge care     Destination     [facility] Preferred SNF .   Service: Skilled Paramedic information: [address] [place]  [address] [phone]                    Discharge Exam: Filed Weights   03/31/23 0500 04/01/23 0500 04/02/23 0219  Weight: 111.3 kg 112.6 kg 112.4 kg      04/02/2023    8:14 AM 04/02/2023    5:02 AM 04/02/2023    2:19 AM  Vitals with BMI  Weight   247 lbs 13 oz  BMI   36.58  Systolic 138 127   Diastolic 57 44   Pulse 47 72     General - Elderly obese Caucasian female, no acute distress. HEENT - PERRLA, EOMI, atraumatic head, severe hard of hearing. Lung - distant breath sounds, diffuse rales, rhonchi, no wheezes. Heart - S1, S2 heard, no murmurs, rubs, 1+ pedal edema. Abdomen - Soft, non tender, bowel sounds good Neuro - Alert, awake and oriented x 3, non focal exam. Skin - Warm and dry.  Condition at discharge: stable  The results of  significant diagnostics from this hospitalization (including imaging, microbiology, ancillary and laboratory) are listed below for reference.   Imaging Studies: ECHOCARDIOGRAM COMPLETE Result Date: 03/24/2023    ECHOCARDIOGRAM REPORT   Patient Name:   Alvino Chapel ANN R Walker Date of Exam: 03/24/2023 Medical Rec #:  [id number]       Height:       69.0 in Accession #:    [id number]      Weight:       252.0 lb Date of Birth:  October 09, 1940       BSA:          2.279 m Patient Age:    83 years        BP:           143/72 mmHg Patient Gender: F               HR:           98 bpm. Exam Location:  [facility] Procedure: 2D Echo, Cardiac Doppler and Color Doppler Indications:     CHF-Acute Diastolic I50.31  History:         Patient has no prior history of Echocardiogram examinations.                  Arrythmias:Atrial Fibrillation; Risk Factors:Hypertension.  Sonographer:     Neysa Bonito Roar Referring Phys:  Wynona Neat NIU Diagnosing Phys: Armanda Magic MD IMPRESSIONS  1. Left ventricular ejection fraction, by estimation, is 50 to 55%. The left ventricle has low normal function. The left ventricle has no regional wall motion abnormalities. There is mild concentric left ventricular hypertrophy. Indeterminate diastolic filling due to E-A fusion. Elevated left ventricular end-diastolic pressure.  2. Right ventricular systolic function is normal. The right ventricular size is normal.  3. The mitral valve is degenerative. Mild mitral valve regurgitation. Moderate mitral annular calcification.  4. Tricuspid valve regurgitation is mild to moderate.  5. The aortic valve is tricuspid. There is moderate calcification of the aortic valve. There is moderate thickening of the aortic valve. Aortic valve regurgitation is not visualized. Aortic valve sclerosis/calcification is present, without any evidence of aortic stenosis. Aortic valve area, by VTI measures 1.68 cm. Aortic valve mean gradient measures 12.5 mmHg. Aortic valve Vmax measures 2.52 m/s.  6. The  inferior vena cava is normal in size with greater than 50% respiratory variability, suggesting right atrial pressure of 3 mmHg.  7. Recommend repeat limited echo to assess MV gradients further as initial measurement  of mean MVG of does not appear accurate and the valve leaflets are not restricted. FINDINGS  Left Ventricle: Left ventricular ejection fraction, by estimation, is 50 to 55%. The left ventricle has low normal function. The left ventricle has no regional wall motion abnormalities. The left ventricular internal cavity size was normal in size. There is mild concentric left ventricular hypertrophy. Indeterminate diastolic filling due to E-A fusion. Elevated left ventricular end-diastolic pressure. Right Ventricle: The right ventricular size is normal. No increase in right ventricular wall thickness. Right ventricular systolic function is normal. Left Atrium: Left atrial size was normal in size. Right Atrium: Right atrial size was normal in size. Pericardium: There is no evidence of pericardial effusion. Mitral Valve: The mitral valve is degenerative in appearance. Moderate mitral annular calcification. Mild mitral valve regurgitation. MV peak gradient, 17.1 mmHg. Tricuspid Valve: The tricuspid valve is normal in structure. Tricuspid valve regurgitation is mild to moderate. No evidence of tricuspid stenosis. Aortic Valve: The aortic valve is tricuspid. There is moderate calcification of the aortic valve. There is moderate thickening of the aortic valve. Aortic valve regurgitation is not visualized. Aortic valve sclerosis/calcification is present, without any  evidence of aortic stenosis. Aortic valve mean gradient measures 12.5 mmHg. Aortic valve peak gradient measures 25.4 mmHg. Aortic valve area, by VTI measures 1.68 cm. Pulmonic Valve: The pulmonic valve was normal in structure. Pulmonic valve regurgitation is not visualized. No evidence of pulmonic stenosis. Aorta: The aortic root is normal in size  and structure. Venous: The inferior vena cava is normal in size with greater than 50% respiratory variability, suggesting right atrial pressure of 3 mmHg. IAS/Shunts: No atrial level shunt detected by color flow Doppler.  LEFT VENTRICLE PLAX 2D LVIDd:         4.00 cm     Diastology LVIDs:         3.20 cm     LV e' medial:    7.27 cm/s LV PW:         1.10 cm     LV E/e' medial:  16.8 LV IVS:        1.10 cm     LV e' lateral:   8.21 cm/s LVOT diam:     2.00 cm     LV E/e' lateral: 14.9 LV SV:         81 LV SV Index:   35 LVOT Area:     3.14 cm  LV Volumes (MOD) LV vol d, MOD A2C: 95.9 ml LV vol d, MOD A4C: 85.8 ml LV vol s, MOD A2C: 57.5 ml LV vol s, MOD A4C: 50.0 ml LV SV MOD A2C:     38.4 ml LV SV MOD A4C:     85.8 ml LV SV MOD BP:      39.2 ml RIGHT VENTRICLE RV Basal diam:  3.20 cm RV Mid diam:    3.00 cm RV S prime:     21.90 cm/s TAPSE (M-mode): 2.5 cm LEFT ATRIUM             Index        RIGHT ATRIUM           Index LA diam:        4.20 cm 1.84 cm/m   RA Area:     19.30 cm LA Vol (A2C):   89.6 ml 39.31 ml/m  RA Volume:   56.60 ml  24.83 ml/m LA Vol (A4C):   70.9 ml 31.11 ml/m LA Biplane Vol:  80.0 ml 35.10 ml/m  AORTIC VALVE                     PULMONIC VALVE AV Area (Vmax):    1.61 cm      PV Vmax:        1.14 m/s AV Area (Vmean):   1.41 cm      PV Peak grad:   5.2 mmHg AV Area (VTI):     1.68 cm      RVOT Peak grad: 2 mmHg AV Vmax:           252.00 cm/s AV Vmean:          163.000 cm/s AV VTI:            0.480 m AV Peak Grad:      25.4 mmHg AV Mean Grad:      12.5 mmHg LVOT Vmax:         129.00 cm/s LVOT Vmean:        72.900 cm/s LVOT VTI:          0.257 m LVOT/AV VTI ratio: 0.54  AORTA Ao Root diam: 2.30 cm Ao Asc diam:  2.60 cm MITRAL VALVE MV Area (PHT): 6.43 cm     SHUNTS MV Area VTI:   2.95 cm     Systemic VTI:  0.26 m MV Peak grad:  17.1 mmHg    Systemic Diam: 2.00 cm MV Vmax:       2.07 m/s MV Vmean:      146.0 cm/s MV Decel Time: 118 msec MV E velocity: 122.00 cm/s MV A velocity: 182.00 cm/s  MV E/A ratio:  0.67 MV A Prime:    19.4 cm/s Armanda Magic MD Electronically signed by Armanda Magic MD Signature Date/Time: 03/24/2023/2:58:54 PM    Final    CT CHEST WO CONTRAST Result Date: 03/24/2023 CLINICAL DATA:  Cough, wheezing, and weakness. EXAM: CT CHEST WITHOUT CONTRAST TECHNIQUE: Multidetector CT imaging of the chest was performed following the standard protocol without IV contrast. RADIATION DOSE REDUCTION: This exam was performed according to the departmental dose-optimization program which includes automated exposure control, adjustment of the mA and/or kV according to patient size and/or use of iterative reconstruction technique. COMPARISON:  Chest radiograph dated 03/23/2023 and CT chest dated 05/02/2017. FINDINGS: Cardiovascular: The main pulmonary artery is enlarged, measuring 3.5 cm in diameter, likely reflecting pulmonary hypertension. Vascular calcifications are seen in the coronary arteries and aortic arch. Normal heart size. No pericardial effusion. Mediastinum/Nodes: No enlarged mediastinal or axillary lymph nodes. There is a 1.5 cm hypoattenuating nodule in the right thyroid lobe. The trachea and esophagus demonstrate no significant findings. Lungs/Pleura: There is mild left basilar atelectasis. Mild bilateral ground-glass opacities and areas of septal line thickening are noted. No pneumothorax or pleural effusion. Upper Abdomen: There is a large hiatal hernia, similar to prior exam. Layering density within the gallbladder may represent gallbladder sludge. Musculoskeletal: Degenerative changes are seen in the spine. There is a chronic appearing wedge deformity of T2 with 25-50% height loss. IMPRESSION: 1. Mild bilateral ground-glass opacities and areas of septal line thickening may reflect pulmonary edema. 2. Enlarged main pulmonary artery likely reflecting pulmonary hypertension. 3. There is a 1.5 cm hypoattenuating nodule in the right thyroid lobe. Recommend nonemergent thyroid US. (Ref:  J Am Coll Radiol. 2015 Feb;12(2): 143-50). Aortic Atherosclerosis (ICD10-I70.0). Electronically Signed   By: Romona Curls M.D.   On: 03/24/2023 12:16   US Venous Img Lower Bilateral (DVT) Result  Date: 03/24/2023 CLINICAL DATA:  Lower extremity edema EXAM: BILATERAL LOWER EXTREMITY VENOUS DOPPLER ULTRASOUND TECHNIQUE: Gray-scale sonography with graded compression, as well as color Doppler and duplex ultrasound were performed to evaluate the lower extremity deep venous systems from the level of the common femoral vein and including the common femoral, femoral, profunda femoral, popliteal and calf veins including the posterior tibial, peroneal and gastrocnemius veins when visible. The superficial great saphenous vein was also interrogated. Spectral Doppler was utilized to evaluate flow at rest and with distal augmentation maneuvers in the common femoral, femoral and popliteal veins. COMPARISON:  None Available. FINDINGS: RIGHT LOWER EXTREMITY Common Femoral Vein: No evidence of thrombus. Normal compressibility, respiratory phasicity and response to augmentation. Saphenofemoral Junction: No evidence of thrombus. Normal compressibility and flow on color Doppler imaging. Profunda Femoral Vein: No evidence of thrombus. Normal compressibility and flow on color Doppler imaging. Femoral Vein: No evidence of thrombus. Normal compressibility, respiratory phasicity and response to augmentation. Popliteal Vein: No evidence of thrombus. Normal compressibility, respiratory phasicity and response to augmentation. Calf Veins: No evidence of thrombus. Normal compressibility and flow on color Doppler imaging. Superficial Great Saphenous Vein: No evidence of thrombus. Normal compressibility. Venous Reflux:  None. Other Findings:  None. LEFT LOWER EXTREMITY Common Femoral Vein: No evidence of thrombus. Normal compressibility, respiratory phasicity and response to augmentation. Saphenofemoral Junction: No evidence of thrombus. Normal  compressibility and flow on color Doppler imaging. Profunda Femoral Vein: No evidence of thrombus. Normal compressibility and flow on color Doppler imaging. Femoral Vein: No evidence of thrombus. Normal compressibility, respiratory phasicity and response to augmentation. Popliteal Vein: No evidence of thrombus. Normal compressibility, respiratory phasicity and response to augmentation. Calf Veins: No evidence of thrombus. Normal compressibility and flow on color Doppler imaging. Superficial Great Saphenous Vein: No evidence of thrombus. Normal compressibility. Venous Reflux:  None. Other Findings:  None. IMPRESSION: No evidence of deep venous thrombosis in either lower extremity. Electronically Signed   By: Malachy Moan M.D.   On: 03/24/2023 07:16   MR BRAIN WO CONTRAST Result Date: 03/23/2023 CLINICAL DATA:  Hallucinations EXAM: MRI HEAD WITHOUT CONTRAST TECHNIQUE: Multiplanar, multiecho pulse sequences of the brain and surrounding structures were obtained without intravenous contrast. COMPARISON:  None Available. FINDINGS: Brain: No acute infarct, mass effect or extra-axial collection. Chronic microhemorrhage in the left occipital lobe. There is confluent hyperintense T2-weighted signal within the white matter. Generalized volume loss. The midline structures are normal. Vascular: Normal flow voids. Skull and upper cervical spine: Normal calvarium and skull base. Visualized upper cervical spine and soft tissues are normal. Sinuses/Orbits:No paranasal sinus fluid levels or advanced mucosal thickening. No mastoid or middle ear effusion. Normal orbits. IMPRESSION: 1. No acute intracranial abnormality. 2. Findings of chronic small vessel ischemia and volume loss. Electronically Signed   By: Deatra Robinson M.D.   On: 03/23/2023 23:15   DG Chest Portable 1 View Result Date: 03/23/2023 CLINICAL DATA:  Hypoxia EXAM: PORTABLE CHEST 1 VIEW COMPARISON:  03/19/2023 FINDINGS: Surgical clips in the left axilla. No  acute airspace disease, pleural effusion or pneumothorax. Stable cardiomediastinal silhouette with moderate to large hiatal hernia. Aortic atherosclerosis IMPRESSION: No active disease. Moderate to large hiatal hernia. Electronically Signed   By: Jasmine Pang M.D.   On: 03/23/2023 19:47   CT Head Wo Contrast Result Date: 03/23/2023 CLINICAL DATA:  Altered mental status after a fall.  Hallucinations. EXAM: CT HEAD WITHOUT CONTRAST TECHNIQUE: Contiguous axial images were obtained from the base of the skull through the vertex without  intravenous contrast. RADIATION DOSE REDUCTION: This exam was performed according to the departmental dose-optimization program which includes automated exposure control, adjustment of the mA and/or kV according to patient size and/or use of iterative reconstruction technique. COMPARISON:  12/13/2022 FINDINGS: Brain: Generalized age related volume loss. Chronic small-vessel ischemic changes of the white matter. No sign of acute infarction, mass lesion, hemorrhage, hydrocephalus or extra-axial collection. Vascular: There is atherosclerotic calcification of the major vessels at the base of the brain. Skull: Negative Sinuses/Orbits: Clear/normal Other: None IMPRESSION: No acute CT finding. Age related volume loss. Chronic small-vessel ischemic changes of the white matter. Electronically Signed   By: Paulina Fusi M.D.   On: 03/23/2023 18:55   DG Chest 2 View Result Date: 03/19/2023 CLINICAL DATA:  Lower extremity edema. EXAM: CHEST - 2 VIEW COMPARISON:  Radiographs 11/30/2022 and 08/17/2022.  CT 05/02/2017. FINDINGS: The heart size and mediastinal contours are stable with a moderate to large hiatal hernia. Interval increased diffuse interstitial prominence throughout both lungs which could reflect edema or superimposed atypical infection. There is no confluent airspace disease, pleural effusion or pneumothorax. There are surgical clips in the left axilla. No acute osseous findings are  evident. IMPRESSION: 1. Interval increased diffuse interstitial prominence throughout both lungs which could reflect edema or superimposed atypical infection. No confluent airspace disease. 2. Moderate to large hiatal hernia. Electronically Signed   By: Carey Bullocks M.D.   On: 03/19/2023 14:32    Microbiology: Results for orders placed or performed during the hospital encounter of 03/23/23  Resp panel by RT-PCR (RSV, Flu A&B, Covid) Anterior Nasal Swab     Status: None   Collection Time: 03/23/23  7:31 PM   Specimen: Anterior Nasal Swab  Result Value Ref Range Status   SARS Coronavirus 2 by RT PCR NEGATIVE NEGATIVE Final    Comment: (NOTE) SARS-CoV-2 target nucleic acids are NOT DETECTED.  The SARS-CoV-2 RNA is generally detectable in upper respiratory specimens during the acute phase of infection. The lowest concentration of SARS-CoV-2 viral copies this assay can detect is 138 copies/mL. A negative result does not preclude SARS-Cov-2 infection and should not be used as the sole basis for treatment or other patient management decisions. A negative result may occur with  improper specimen collection/handling, submission of specimen other than nasopharyngeal swab, presence of viral mutation(s) within the areas targeted by this assay, and inadequate number of viral copies(<138 copies/mL). A negative result must be combined with clinical observations, patient history, and epidemiological information. The expected result is Negative.  Fact Sheet for Patients:  BloggerCourse.com  Fact Sheet for Healthcare Providers:  SeriousBroker.it  This test is no t yet approved or cleared by the [place] FDA and  has been authorized for detection and/or diagnosis of SARS-CoV-2 by FDA under an Emergency Use Authorization (EUA). This EUA will remain  in effect (meaning this test can be used) for the duration of the COVID-19 declaration under  Section 564(b)(1) of the Act, 21 U.S.C.section 360bbb-3(b)(1), unless the authorization is terminated  or revoked sooner.       Influenza A by PCR NEGATIVE NEGATIVE Final   Influenza B by PCR NEGATIVE NEGATIVE Final    Comment: (NOTE) The Xpert Xpress SARS-CoV-2/FLU/RSV plus assay is intended as an aid in the diagnosis of influenza from Nasopharyngeal swab specimens and should not be used as a sole basis for treatment. Nasal washings and aspirates are unacceptable for Xpert Xpress SARS-CoV-2/FLU/RSV testing.  Fact Sheet for Patients: BloggerCourse.com  Fact Sheet for Healthcare  Providers: SeriousBroker.it  This test is not yet approved or cleared by the [place] and has been authorized for detection and/or diagnosis of SARS-CoV-2 by FDA under an Emergency Use Authorization (EUA). This EUA will remain in effect (meaning this test can be used) for the duration of the COVID-19 declaration under Section 564(b)(1) of the Act, 21 U.S.C. section 360bbb-3(b)(1), unless the authorization is terminated or revoked.     Resp Syncytial Virus by PCR NEGATIVE NEGATIVE Final    Comment: (NOTE) Fact Sheet for Patients: BloggerCourse.com  Fact Sheet for Healthcare Providers: SeriousBroker.it  This test is not yet approved or cleared by the [place] FDA and has been authorized for detection and/or diagnosis of SARS-CoV-2 by FDA under an Emergency Use Authorization (EUA). This EUA will remain in effect (meaning this test can be used) for the duration of the COVID-19 declaration under Section 564(b)(1) of the Act, 21 U.S.C. section 360bbb-3(b)(1), unless the authorization is terminated or revoked.  Performed at [facility], [address] [place], [place] [address]   Respiratory (~20 pathogens) panel by PCR     Status: None   Collection Time: 03/24/23  4:50 AM    Specimen: Nasopharyngeal Swab; Respiratory  Result Value Ref Range Status   Adenovirus NOT DETECTED NOT DETECTED Final   Coronavirus 229E NOT DETECTED NOT DETECTED Final    Comment: (NOTE) The Coronavirus on the Respiratory Panel, DOES NOT test for the novel  Coronavirus (2019 nCoV)    Coronavirus HKU1 NOT DETECTED NOT DETECTED Final   Coronavirus NL63 NOT DETECTED NOT DETECTED Final   Coronavirus OC43 NOT DETECTED NOT DETECTED Final   Metapneumovirus NOT DETECTED NOT DETECTED Final   Rhinovirus / Enterovirus NOT DETECTED NOT DETECTED Final   Influenza A NOT DETECTED NOT DETECTED Final   Influenza B NOT DETECTED NOT DETECTED Final   Parainfluenza Virus 1 NOT DETECTED NOT DETECTED Final   Parainfluenza Virus 2 NOT DETECTED NOT DETECTED Final   Parainfluenza Virus 3 NOT DETECTED NOT DETECTED Final   Parainfluenza Virus 4 NOT DETECTED NOT DETECTED Final   Respiratory Syncytial Virus NOT DETECTED NOT DETECTED Final   Bordetella pertussis NOT DETECTED NOT DETECTED Final   Bordetella Parapertussis NOT DETECTED NOT DETECTED Final   Chlamydophila pneumoniae NOT DETECTED NOT DETECTED Final   Mycoplasma pneumoniae NOT DETECTED NOT DETECTED Final    Comment: Performed at [facility], [address], [place], [place] [address]   *Note: Due to a large number of results and/or encounters for the requested time period, some results have not been displayed. A complete set of results can be found in Results Review.    Labs: CBC: Recent Labs  Lab 03/31/23 0445  WBC 5.8  HGB 13.5  HCT 38.5  MCV 84.6  PLT 204   Basic Metabolic Panel: Recent Labs  Lab 03/27/23 0545 03/31/23 0445 04/02/23 0601  NA 131* 127* 132*  K 4.1 4.1  --   CL 93* 93*  --   CO2 28 23  --   GLUCOSE 104* 105*  --   BUN 35* 30*  --   CREATININE 0.75 0.69  --   CALCIUM 9.0 8.8*  --    Liver Function Tests: No results for input(s): "AST", "ALT", "ALKPHOS", "BILITOT", "PROT", "ALBUMIN" in the last 168  hours. CBG: Recent Labs  Lab 03/27/23 0757 03/28/23 0742 03/30/23 0738 03/31/23 0801  GLUCAP 100* 107* 90 120*    Discharge time spent: 36 minutes.  Signed: Lynne Logan  Clide Dales, MD [facility] 04/02/2023

## 2023-04-02 NOTE — [place] FL2 (Signed)
[place] MEDICAID FL2 LEVEL OF CARE FORM     IDENTIFICATION  Patient Name: Anita Walker Birthdate: Jun 26, 1940 Sex: female Admission Date (Current Location): 03/23/2023  [facility] and [place] Number:  Chiropodist and Address:  [facility], [address], [place], [place] [address]      Provider Number: [id number]  Attending Physician Name and Address:  Marcelino Duster, MD  Relative Name and Phone Number:  Pamala Duffel (Daughter)  [phone] (    Current Level of Care: Hospital Recommended Level of Care: Skilled Nursing Facility Prior Approval Number:    Date Approved/Denied:   PASRR Number: [id number]  Discharge Plan: SNF    Current Diagnoses: Patient Active Problem List   Diagnosis Date Noted   Localized swelling of both lower extremities 03/25/2023   Acute on chronic heart failure with preserved ejection fraction (HFpEF) (HCC) 03/25/2023   Acute bronchitis 03/24/2023   Hallucination 03/23/2023   Obesity (BMI 30-39.9) 03/23/2023   Bilateral leg edema 03/19/2023   Daytime sleepiness 02/17/2023   Anxiety 12/24/2022   Nocturia 12/09/2022   Hyponatremia 12/05/2022   Urinary hesitancy 11/03/2022   Urinary urgency 08/16/2022   Hearing loss 04/26/2022   Congestion of nasal sinus 04/26/2022   Recurrent falls 07/08/2021   Weakness of both lower extremities 07/08/2021   Closed superior and inferior pubic rami fractures, sequela (Right) 12/15/2020   Low back strain 11/15/2020   Lumbar spondylosis 11/15/2020   History of pelvic fracture 07/04/2020   Anemia 07/04/2020   Cerebral atrophy (HCC) 03/20/2020   Chronic low back pain (Midline) w/o sciatica 01/26/2020   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   Kissing spine syndrome (L3-S1) 01/19/2020   Grade 1 Retrolisthesis of L3/L4 12/18/2019   Lip lesion 12/14/2019   Acute exacerbation of chronic low back pain 12/11/2019   At high risk for falls 12/11/2019   Fall at home,  sequela 12/11/2019   Head injury 12/02/2019   Bradycardia following surgery 11/18/2019   Wrist fracture 11/02/2019   Weakness 11/02/2019   Change in bowel movement 11/02/2019   DDD (degenerative disc disease), lumbosacral 09/18/2019   Irritability and anger 09/18/2019   Nausea without vomiting 09/18/2019   History of vasovagal symptom 08/14/2019   Polyp of descending colon    Positive colorectal cancer screening using Cologuard test 05/25/2019   Spondylosis without myelopathy or radiculopathy, lumbosacral region 03/11/2019   Chronic 50% compression fracture of L3 lumbar vertebra, sequela, w/ retropulsion 02/18/2019   Compression fracture of thoracolumbar vertebra (T11), sequela 02/18/2019   Lumbar facet arthropathy (Multilevel) 02/18/2019   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, 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) 02/18/2019   L5-S1 disc-osteophyte complex (Right) 02/18/2019   Lumbar facet syndrome (Bilateral) (R>L) 02/18/2019   Chronic anticoagulation (Coumadin) 02/18/2019   Chronic low back pain (Bilateral) (R>L) w/o sciatica 02/18/2019   Chronic pain syndrome 02/18/2019   Compression fracture of thoracic vertebra (HCC) 10/12/2018   Pain due to onychomycosis of toenails of both feet 09/02/2018   Constipation 08/18/2018   Fall 08/18/2018   Diabetic polyneuropathy associated with type 2 diabetes mellitus (HCC) 03/18/2017   Abnormal chest CT 03/18/2017   Urinary frequency 01/21/2017   Syncope 11/10/2016   Right hip pain 10/30/2016   Cough 10/25/2016   Bronchitis 04/16/2015   Health care maintenance 05/23/2014   Mixed emotional features as adjustment reaction 07/20/2013   Long term current use of anticoagulant therapy 04/11/2013   Hypertension 01/01/2012   Hypercholesteremia 01/01/2012   History of breast cancer 01/01/2012   Essential  hypertension 01/01/2012   Pure hypercholesterolemia 01/01/2012   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    Orientation  RESPIRATION BLADDER Height & Weight     Self, Time, Place  Normal Incontinent Weight: 247 lb 12.8 oz (112.4 kg) Height:  5\' 9"  (175.3 cm)  BEHAVIORAL SYMPTOMS/MOOD NEUROLOGICAL BOWEL NUTRITION STATUS      Incontinent    AMBULATORY STATUS COMMUNICATION OF NEEDS Skin   Limited Assist Verbally  (dermatitis pelvis)                       Personal Care Assistance Level of Assistance  Bathing, Feeding, Dressing Bathing Assistance: Limited assistance Feeding assistance: Limited assistance Dressing Assistance: Limited assistance     Functional Limitations Info  Sight, Hearing, Speech Sight Info: Impaired Hearing Info: Impaired Speech Info: Adequate    SPECIAL CARE FACTORS FREQUENCY  PT (By licensed PT), OT (By licensed OT)     PT Frequency: 5 times a week OT Frequency: 5 times a week            Contractures Contractures Info: Not present    Additional Factors Info  Code Status, Allergies Code Status Info: DNR-limited Allergies Info: Penicillins, Penicillin V Potassium           Current Medications (04/02/2023):  This is the current hospital active medication list Current Facility-Administered Medications  Medication Dose Route Frequency Provider Last Rate Last Admin   acetaminophen (TYLENOL) tablet 650 mg  650 mg Oral Q6H PRN Marcelino Duster, MD   650 mg at 03/31/23 2211   albuterol (PROVENTIL) (2.5 MG/3ML) 0.083% nebulizer solution 2.5 mg  2.5 mg Nebulization Q4H PRN Lorretta Harp, MD       alum & mag hydroxide-simeth (MAALOX/MYLANTA) 200-200-20 MG/5ML suspension 15 mL  15 mL Oral Q6H PRN Marcelino Duster, MD   15 mL at 03/31/23 2226   aspirin EC tablet 81 mg  81 mg Oral Daily Lorretta Harp, MD   81 mg at 04/02/23 0837   busPIRone (BUSPAR) tablet 5 mg  5 mg Oral Q1400 Lorretta Harp, MD   5 mg at 04/01/23 1339   dextromethorphan-guaiFENesin (MUCINEX DM) 30-600 MG per 12 hr tablet 1 tablet  1 tablet Oral BID PRN Lorretta Harp, MD   1 tablet at 03/26/23 0248    diphenhydrAMINE (BENADRYL) capsule 25 mg  25 mg Oral QHS PRN Lorretta Harp, MD       enoxaparin (LOVENOX) injection 57.5 mg  0.5 mg/kg Subcutaneous Q24H Lorretta Harp, MD   57.5 mg at 04/01/23 2329   ferrous sulfate tablet 325 mg  325 mg Oral Raeanne Gathers, MD   325 mg at 04/01/23 0950   fluticasone (FLONASE) 50 MCG/ACT nasal spray 2 spray  2 spray Each Nare Daily Marcelino Duster, MD   2 spray at 04/02/23 0836   furosemide (LASIX) tablet 20 mg  20 mg Oral Daily Lurene Shadow, MD   20 mg at 04/02/23 1610   hydrALAZINE (APRESOLINE) injection 5 mg  5 mg Intravenous Q2H PRN Lorretta Harp, MD       multivitamin with minerals tablet 1 tablet  1 tablet Oral Daily Lorretta Harp, MD   1 tablet at 04/02/23 0836   ondansetron (ZOFRAN) injection 4 mg  4 mg Intravenous Q8H PRN Lorretta Harp, MD       pantoprazole (PROTONIX) EC tablet 40 mg  40 mg Oral Daily Lorretta Harp, MD   40 mg at 04/02/23 0837   simvastatin (ZOCOR) tablet  10 mg  10 mg Oral Daily Lorretta Harp, MD   10 mg at 04/02/23 1610     Discharge Medications: Please see discharge summary for a list of discharge medications.  Relevant Imaging Results:  Relevant Lab Results:   Additional Information SS# [id number]  Allena Katz, LCSW

## 2023-04-02 NOTE — TOC Progression Note (Deleted)
Transition of Care [facility]) - Progression Note    Patient Details  Name: Anita Walker MRN: [id number] Date of Birth: 08/06/1940  [facility]) CM/SW Contact  Chapman Fitch, RN Phone Number: 04/02/2023, 9:32 AM  Clinical Narrative:     [facility] resides at [facility] Spoke with Renee at [facility] who confirms that patient will require Fl2 at discharge  Luster Landsberg can be reached at [phone] or her cell number listed on file.  She request that if patient discharges to place DC summary and Fl2 in discharge packet  She confirms they will be able to provide discharge transportation  Legal Guardian will need to be notified once confirmed patient to discharge       Expected Discharge Plan and Services                                               Social Determinants of Health (SDOH) Interventions SDOH Screenings   Food Insecurity: No Food Insecurity (03/26/2023)  Housing: Low Risk  (03/26/2023)  Transportation Needs: No Transportation Needs (03/26/2023)  Utilities: Not At Risk (03/26/2023)  Depression (PHQ2-9): Low Risk  (05/05/2022)  Financial Resource Strain: Low Risk  (12/22/2022)  Physical Activity: Inactive (12/22/2022)  Social Connections: Socially Isolated (03/26/2023)  Stress: No Stress Concern Present (12/22/2022)  Tobacco Use: Low Risk  (03/26/2023)    Readmission Risk Interventions     No data to display

## 2023-04-03 DIAGNOSIS — Z86711 Personal history of pulmonary embolism: Secondary | ICD-10-CM | POA: Diagnosis not present

## 2023-04-03 DIAGNOSIS — M6281 Muscle weakness (generalized): Secondary | ICD-10-CM | POA: Diagnosis not present

## 2023-04-03 DIAGNOSIS — J209 Acute bronchitis, unspecified: Secondary | ICD-10-CM | POA: Diagnosis not present

## 2023-04-03 DIAGNOSIS — I5033 Acute on chronic diastolic (congestive) heart failure: Secondary | ICD-10-CM | POA: Diagnosis not present

## 2023-04-03 DIAGNOSIS — E7800 Pure hypercholesterolemia, unspecified: Secondary | ICD-10-CM | POA: Diagnosis not present

## 2023-04-03 DIAGNOSIS — F419 Anxiety disorder, unspecified: Secondary | ICD-10-CM | POA: Diagnosis not present

## 2023-04-03 DIAGNOSIS — G9341 Metabolic encephalopathy: Secondary | ICD-10-CM | POA: Diagnosis not present

## 2023-04-03 DIAGNOSIS — E871 Hypo-osmolality and hyponatremia: Secondary | ICD-10-CM | POA: Diagnosis not present

## 2023-04-03 DIAGNOSIS — I10 Essential (primary) hypertension: Secondary | ICD-10-CM | POA: Diagnosis not present

## 2023-04-03 DIAGNOSIS — Z9181 History of falling: Secondary | ICD-10-CM | POA: Diagnosis not present

## 2023-04-03 DIAGNOSIS — E119 Type 2 diabetes mellitus without complications: Secondary | ICD-10-CM | POA: Diagnosis not present

## 2023-04-03 DIAGNOSIS — I4891 Unspecified atrial fibrillation: Secondary | ICD-10-CM | POA: Diagnosis not present

## 2023-04-03 DIAGNOSIS — R2689 Other abnormalities of gait and mobility: Secondary | ICD-10-CM | POA: Diagnosis not present

## 2023-04-04 DIAGNOSIS — J209 Acute bronchitis, unspecified: Secondary | ICD-10-CM | POA: Diagnosis not present

## 2023-04-04 DIAGNOSIS — E871 Hypo-osmolality and hyponatremia: Secondary | ICD-10-CM | POA: Diagnosis not present

## 2023-04-04 DIAGNOSIS — G9341 Metabolic encephalopathy: Secondary | ICD-10-CM | POA: Diagnosis not present

## 2023-04-04 DIAGNOSIS — E7800 Pure hypercholesterolemia, unspecified: Secondary | ICD-10-CM | POA: Diagnosis not present

## 2023-04-04 DIAGNOSIS — I10 Essential (primary) hypertension: Secondary | ICD-10-CM | POA: Diagnosis not present

## 2023-04-04 DIAGNOSIS — I5033 Acute on chronic diastolic (congestive) heart failure: Secondary | ICD-10-CM | POA: Diagnosis not present

## 2023-04-04 DIAGNOSIS — E119 Type 2 diabetes mellitus without complications: Secondary | ICD-10-CM | POA: Diagnosis not present

## 2023-04-04 DIAGNOSIS — I4891 Unspecified atrial fibrillation: Secondary | ICD-10-CM | POA: Diagnosis not present

## 2023-04-04 DIAGNOSIS — Z86711 Personal history of pulmonary embolism: Secondary | ICD-10-CM | POA: Diagnosis not present

## 2023-04-04 DIAGNOSIS — Z9181 History of falling: Secondary | ICD-10-CM | POA: Diagnosis not present

## 2023-04-04 DIAGNOSIS — F419 Anxiety disorder, unspecified: Secondary | ICD-10-CM | POA: Diagnosis not present

## 2023-04-06 DIAGNOSIS — Z9181 History of falling: Secondary | ICD-10-CM | POA: Diagnosis not present

## 2023-04-06 DIAGNOSIS — I10 Essential (primary) hypertension: Secondary | ICD-10-CM | POA: Diagnosis not present

## 2023-04-06 DIAGNOSIS — I5033 Acute on chronic diastolic (congestive) heart failure: Secondary | ICD-10-CM | POA: Diagnosis not present

## 2023-04-06 DIAGNOSIS — E7800 Pure hypercholesterolemia, unspecified: Secondary | ICD-10-CM | POA: Diagnosis not present

## 2023-04-06 DIAGNOSIS — F419 Anxiety disorder, unspecified: Secondary | ICD-10-CM | POA: Diagnosis not present

## 2023-04-06 DIAGNOSIS — R2689 Other abnormalities of gait and mobility: Secondary | ICD-10-CM | POA: Diagnosis not present

## 2023-04-06 DIAGNOSIS — E871 Hypo-osmolality and hyponatremia: Secondary | ICD-10-CM | POA: Diagnosis not present

## 2023-04-06 DIAGNOSIS — M6281 Muscle weakness (generalized): Secondary | ICD-10-CM | POA: Diagnosis not present

## 2023-04-06 DIAGNOSIS — I4891 Unspecified atrial fibrillation: Secondary | ICD-10-CM | POA: Diagnosis not present

## 2023-04-06 DIAGNOSIS — J209 Acute bronchitis, unspecified: Secondary | ICD-10-CM | POA: Diagnosis not present

## 2023-04-06 DIAGNOSIS — E119 Type 2 diabetes mellitus without complications: Secondary | ICD-10-CM | POA: Diagnosis not present

## 2023-04-06 DIAGNOSIS — Z86711 Personal history of pulmonary embolism: Secondary | ICD-10-CM | POA: Diagnosis not present

## 2023-04-06 DIAGNOSIS — G9341 Metabolic encephalopathy: Secondary | ICD-10-CM | POA: Diagnosis not present

## 2023-04-09 DIAGNOSIS — F4329 Adjustment disorder with other symptoms: Secondary | ICD-10-CM | POA: Diagnosis not present

## 2023-04-09 DIAGNOSIS — E7800 Pure hypercholesterolemia, unspecified: Secondary | ICD-10-CM | POA: Diagnosis not present

## 2023-04-09 DIAGNOSIS — E66811 Obesity, class 1: Secondary | ICD-10-CM | POA: Diagnosis not present

## 2023-04-09 DIAGNOSIS — Z7901 Long term (current) use of anticoagulants: Secondary | ICD-10-CM | POA: Diagnosis not present

## 2023-04-09 DIAGNOSIS — I10 Essential (primary) hypertension: Secondary | ICD-10-CM | POA: Diagnosis not present

## 2023-04-09 DIAGNOSIS — F419 Anxiety disorder, unspecified: Secondary | ICD-10-CM | POA: Diagnosis not present

## 2023-04-09 DIAGNOSIS — E871 Hypo-osmolality and hyponatremia: Secondary | ICD-10-CM | POA: Diagnosis not present

## 2023-04-09 DIAGNOSIS — E119 Type 2 diabetes mellitus without complications: Secondary | ICD-10-CM | POA: Diagnosis not present

## 2023-04-09 DIAGNOSIS — I4891 Unspecified atrial fibrillation: Secondary | ICD-10-CM | POA: Diagnosis not present

## 2023-04-09 DIAGNOSIS — R0602 Shortness of breath: Secondary | ICD-10-CM | POA: Diagnosis not present

## 2023-04-09 DIAGNOSIS — Z86711 Personal history of pulmonary embolism: Secondary | ICD-10-CM | POA: Diagnosis not present

## 2023-04-09 DIAGNOSIS — J209 Acute bronchitis, unspecified: Secondary | ICD-10-CM | POA: Diagnosis not present

## 2023-04-09 DIAGNOSIS — E1142 Type 2 diabetes mellitus with diabetic polyneuropathy: Secondary | ICD-10-CM | POA: Diagnosis not present

## 2023-04-09 DIAGNOSIS — Z9181 History of falling: Secondary | ICD-10-CM | POA: Diagnosis not present

## 2023-04-09 DIAGNOSIS — I5033 Acute on chronic diastolic (congestive) heart failure: Secondary | ICD-10-CM | POA: Diagnosis not present

## 2023-04-09 DIAGNOSIS — G9341 Metabolic encephalopathy: Secondary | ICD-10-CM | POA: Diagnosis not present

## 2023-04-09 DIAGNOSIS — K219 Gastro-esophageal reflux disease without esophagitis: Secondary | ICD-10-CM | POA: Diagnosis not present

## 2023-04-09 DIAGNOSIS — G4733 Obstructive sleep apnea (adult) (pediatric): Secondary | ICD-10-CM | POA: Diagnosis not present

## 2023-04-09 DIAGNOSIS — I5022 Chronic systolic (congestive) heart failure: Secondary | ICD-10-CM | POA: Diagnosis not present

## 2023-04-10 DIAGNOSIS — F419 Anxiety disorder, unspecified: Secondary | ICD-10-CM | POA: Diagnosis not present

## 2023-04-10 DIAGNOSIS — J209 Acute bronchitis, unspecified: Secondary | ICD-10-CM | POA: Diagnosis not present

## 2023-04-10 DIAGNOSIS — I4891 Unspecified atrial fibrillation: Secondary | ICD-10-CM | POA: Diagnosis not present

## 2023-04-10 DIAGNOSIS — Z9181 History of falling: Secondary | ICD-10-CM | POA: Diagnosis not present

## 2023-04-10 DIAGNOSIS — E119 Type 2 diabetes mellitus without complications: Secondary | ICD-10-CM | POA: Diagnosis not present

## 2023-04-10 DIAGNOSIS — E7800 Pure hypercholesterolemia, unspecified: Secondary | ICD-10-CM | POA: Diagnosis not present

## 2023-04-10 DIAGNOSIS — I10 Essential (primary) hypertension: Secondary | ICD-10-CM | POA: Diagnosis not present

## 2023-04-10 DIAGNOSIS — I5033 Acute on chronic diastolic (congestive) heart failure: Secondary | ICD-10-CM | POA: Diagnosis not present

## 2023-04-10 DIAGNOSIS — G9341 Metabolic encephalopathy: Secondary | ICD-10-CM | POA: Diagnosis not present

## 2023-04-10 DIAGNOSIS — Z86711 Personal history of pulmonary embolism: Secondary | ICD-10-CM | POA: Diagnosis not present

## 2023-04-10 DIAGNOSIS — E871 Hypo-osmolality and hyponatremia: Secondary | ICD-10-CM | POA: Diagnosis not present

## 2023-04-11 DIAGNOSIS — E871 Hypo-osmolality and hyponatremia: Secondary | ICD-10-CM | POA: Diagnosis not present

## 2023-04-11 DIAGNOSIS — G9341 Metabolic encephalopathy: Secondary | ICD-10-CM | POA: Diagnosis not present

## 2023-04-11 DIAGNOSIS — Z86711 Personal history of pulmonary embolism: Secondary | ICD-10-CM | POA: Diagnosis not present

## 2023-04-11 DIAGNOSIS — F419 Anxiety disorder, unspecified: Secondary | ICD-10-CM | POA: Diagnosis not present

## 2023-04-11 DIAGNOSIS — J209 Acute bronchitis, unspecified: Secondary | ICD-10-CM | POA: Diagnosis not present

## 2023-04-11 DIAGNOSIS — I4891 Unspecified atrial fibrillation: Secondary | ICD-10-CM | POA: Diagnosis not present

## 2023-04-11 DIAGNOSIS — E119 Type 2 diabetes mellitus without complications: Secondary | ICD-10-CM | POA: Diagnosis not present

## 2023-04-11 DIAGNOSIS — Z9181 History of falling: Secondary | ICD-10-CM | POA: Diagnosis not present

## 2023-04-11 DIAGNOSIS — I5033 Acute on chronic diastolic (congestive) heart failure: Secondary | ICD-10-CM | POA: Diagnosis not present

## 2023-04-11 DIAGNOSIS — E7800 Pure hypercholesterolemia, unspecified: Secondary | ICD-10-CM | POA: Diagnosis not present

## 2023-04-11 DIAGNOSIS — I10 Essential (primary) hypertension: Secondary | ICD-10-CM | POA: Diagnosis not present

## 2023-04-13 DIAGNOSIS — M6281 Muscle weakness (generalized): Secondary | ICD-10-CM | POA: Diagnosis not present

## 2023-04-13 DIAGNOSIS — I5033 Acute on chronic diastolic (congestive) heart failure: Secondary | ICD-10-CM | POA: Diagnosis not present

## 2023-04-13 DIAGNOSIS — R2689 Other abnormalities of gait and mobility: Secondary | ICD-10-CM | POA: Diagnosis not present

## 2023-04-13 DIAGNOSIS — F419 Anxiety disorder, unspecified: Secondary | ICD-10-CM | POA: Diagnosis not present

## 2023-04-17 ENCOUNTER — Other Ambulatory Visit: Payer: Self-pay | Admitting: *Deleted

## 2023-04-17 DIAGNOSIS — I10 Essential (primary) hypertension: Secondary | ICD-10-CM | POA: Diagnosis not present

## 2023-04-17 DIAGNOSIS — I4891 Unspecified atrial fibrillation: Secondary | ICD-10-CM | POA: Diagnosis not present

## 2023-04-17 DIAGNOSIS — E871 Hypo-osmolality and hyponatremia: Secondary | ICD-10-CM | POA: Diagnosis not present

## 2023-04-17 DIAGNOSIS — Z86711 Personal history of pulmonary embolism: Secondary | ICD-10-CM | POA: Diagnosis not present

## 2023-04-17 DIAGNOSIS — E119 Type 2 diabetes mellitus without complications: Secondary | ICD-10-CM | POA: Diagnosis not present

## 2023-04-17 DIAGNOSIS — E7800 Pure hypercholesterolemia, unspecified: Secondary | ICD-10-CM | POA: Diagnosis not present

## 2023-04-17 DIAGNOSIS — G9341 Metabolic encephalopathy: Secondary | ICD-10-CM | POA: Diagnosis not present

## 2023-04-17 DIAGNOSIS — Z9181 History of falling: Secondary | ICD-10-CM | POA: Diagnosis not present

## 2023-04-17 DIAGNOSIS — I5033 Acute on chronic diastolic (congestive) heart failure: Secondary | ICD-10-CM | POA: Diagnosis not present

## 2023-04-17 DIAGNOSIS — J209 Acute bronchitis, unspecified: Secondary | ICD-10-CM | POA: Diagnosis not present

## 2023-04-17 DIAGNOSIS — F419 Anxiety disorder, unspecified: Secondary | ICD-10-CM | POA: Diagnosis not present

## 2023-04-17 NOTE — Patient Outreach (Signed)
Anita Walker resides in [facility].  Screening for potential chronic care management services as a benefit of health plan and primary care provider.  Secure communication sent to [employer] to collaborate about transition plans and potential VBCI complex care management needs.   Will continue to follow.   Raiford Noble, MSN, RN, BSN [place]  [facility], [facility] RN Post- Acute Care Manager Direct Dial: [phone]

## 2023-04-23 DIAGNOSIS — J439 Emphysema, unspecified: Secondary | ICD-10-CM | POA: Diagnosis not present

## 2023-04-23 DIAGNOSIS — R0602 Shortness of breath: Secondary | ICD-10-CM | POA: Diagnosis not present

## 2023-04-24 DIAGNOSIS — I4891 Unspecified atrial fibrillation: Secondary | ICD-10-CM | POA: Diagnosis not present

## 2023-04-24 DIAGNOSIS — E119 Type 2 diabetes mellitus without complications: Secondary | ICD-10-CM | POA: Diagnosis not present

## 2023-04-24 DIAGNOSIS — I5033 Acute on chronic diastolic (congestive) heart failure: Secondary | ICD-10-CM | POA: Diagnosis not present

## 2023-04-24 DIAGNOSIS — E871 Hypo-osmolality and hyponatremia: Secondary | ICD-10-CM | POA: Diagnosis not present

## 2023-04-24 DIAGNOSIS — F419 Anxiety disorder, unspecified: Secondary | ICD-10-CM | POA: Diagnosis not present

## 2023-04-24 DIAGNOSIS — G9341 Metabolic encephalopathy: Secondary | ICD-10-CM | POA: Diagnosis not present

## 2023-04-24 DIAGNOSIS — Z9181 History of falling: Secondary | ICD-10-CM | POA: Diagnosis not present

## 2023-04-24 DIAGNOSIS — E7800 Pure hypercholesterolemia, unspecified: Secondary | ICD-10-CM | POA: Diagnosis not present

## 2023-04-24 DIAGNOSIS — J209 Acute bronchitis, unspecified: Secondary | ICD-10-CM | POA: Diagnosis not present

## 2023-04-24 DIAGNOSIS — I10 Essential (primary) hypertension: Secondary | ICD-10-CM | POA: Diagnosis not present

## 2023-04-24 DIAGNOSIS — Z86711 Personal history of pulmonary embolism: Secondary | ICD-10-CM | POA: Diagnosis not present

## 2023-04-27 ENCOUNTER — Encounter (INDEPENDENT_AMBULATORY_CARE_PROVIDER_SITE_OTHER): Payer: Self-pay | Admitting: Nurse Practitioner

## 2023-04-27 ENCOUNTER — Ambulatory Visit (INDEPENDENT_AMBULATORY_CARE_PROVIDER_SITE_OTHER): Payer: Medicare Other | Admitting: Nurse Practitioner

## 2023-04-27 VITALS — BP 122/79 | HR 53 | Resp 18 | Ht 69.0 in | Wt 247.0 lb

## 2023-04-27 DIAGNOSIS — E1149 Type 2 diabetes mellitus with other diabetic neurological complication: Secondary | ICD-10-CM | POA: Diagnosis not present

## 2023-04-27 DIAGNOSIS — I10 Essential (primary) hypertension: Secondary | ICD-10-CM

## 2023-04-27 DIAGNOSIS — M7989 Other specified soft tissue disorders: Secondary | ICD-10-CM

## 2023-04-27 NOTE — Progress Notes (Signed)
Subjective:    [name], female    DOB: March 16, 1940, 83 y.o.   MRN: [id number] Chief Complaint  Patient presents with   New Patient (Initial Visit)    np. consult. bilateral leg swelling. scott, charlene.    The patient presents today for evaluation of bilateral lower extremity edema.  She notes that it has been going on for some years.  The patient unfortunately also has weakness of her lower extremities and has been in a nursing facility after recent fall.  She has been working with physical therapy to regain her strength so that hopefully she can return home soon.  But with this her lower extremity edema does make her legs feel heavier and makes her activity somewhat more difficult.  She has tried to wear medical grade compression however given the swelling she is not able to fit these properly.  She does elevate her extremities when possible and walks when possible physical therapy.  Currently there are no open wounds or ulcerations.  No evidence of cellulitis.  She denies claudication-like symptoms.  There are also no rest pain like symptoms.    Review of Systems  Cardiovascular:  Positive for leg swelling.  Neurological:  Positive for weakness.  All other systems reviewed and are negative.      Objective:   Physical Exam Vitals reviewed.  HENT:     Head: Normocephalic.  Cardiovascular:     Rate and Rhythm: Normal rate.  Pulmonary:     Effort: Pulmonary effort is normal.  Musculoskeletal:     Right lower leg: Edema present.     Left lower leg: Edema present.  Skin:    General: Skin is warm and dry.  Neurological:     Mental Status: She is alert and oriented to person, place, and time.     Motor: Weakness present.  Psychiatric:        Mood and Affect: Mood normal.        Behavior: Behavior normal.        Thought Content: Thought content normal.        Judgment: Judgment normal.     BP 122/79   Pulse (!) 53   Resp 18   Ht 5\' 9"  (1.753 m)   Wt  247 lb (112 kg)   BMI 36.48 kg/m   Past Medical History:  Diagnosis Date   Anemia    Arthritis    Atrial fibrillation (HCC)    Breast cancer (HCC)    s/p lumpectomy 1992.  s/p chemo and xrt left breast   Diabetes mellitus (HCC)    Edema    feet/legs   Gastric ulcer    GERD (gastroesophageal reflux disease)    HOH (hard of hearing)    aides   Hypercholesterolemia    Hypertension    Personal history of chemotherapy    Pulmonary emboli (HCC)     Social History   Socioeconomic History   Marital status: Widowed    Spouse name: Not on file   Number of children: Not on file   Years of education: Not on file   Highest education level: 10th grade  Occupational History   Not on file  Tobacco Use   Smoking status: Never   Smokeless tobacco: Never  Vaping Use   Vaping status: Never Used  Substance and Sexual Activity   Alcohol use: No    Alcohol/week: 0.0 standard drinks of alcohol   Drug use: No   Sexual  activity: Never  Other Topics Concern   Not on file  Social History Narrative   Not on file   Social Drivers of Health   Financial Resource Strain: Low Risk  (04/23/2023)   Received from [facility]   Overall Financial Resource Strain (CARDIA)    Difficulty of Paying Living Expenses: Not hard at all  Food Insecurity: No Food Insecurity (04/23/2023)   Received from [facility]   Hunger Vital Sign    Worried About Running Out of Food in the Last Year: Never true    Ran Out of Food in the Last Year: Never true  Transportation Needs: No Transportation Needs (04/23/2023)   Received from [facility] - Transportation    In the past 12 months, has lack of transportation kept you from medical appointments or from getting medications?: No    Lack of Transportation (Non-Medical): No  Physical Activity: Inactive (12/22/2022)   Exercise Vital Sign    Days of Exercise per Week: 0 days    Minutes of Exercise per  Session: 20 min  Stress: No Stress Concern Present (12/22/2022)   [employer] of Occupational Health - Occupational Stress Questionnaire    Feeling of Stress : Only a little  Social Connections: Socially Isolated (03/26/2023)   Social Connection and Isolation Panel [NHANES]    Frequency of Communication with Friends and Family: Twice a week    Frequency of Social Gatherings with Friends and Family: Twice a week    Attends Religious Services: Never    Database administrator or Organizations: No    Attends Banker Meetings: Never    Marital Status: Widowed  Intimate Partner Violence: Not At Risk (03/26/2023)   Humiliation, Afraid, Rape, and Kick questionnaire    Fear of Current or Ex-Partner: No    Emotionally Abused: No    Physically Abused: No    Sexually Abused: No    Past Surgical History:  Procedure Laterality Date   BREAST LUMPECTOMY  03/06/1990   left breast   CATARACT EXTRACTION W/PHACO Right 12/26/2016   Procedure: CATARACT EXTRACTION PHACO AND INTRAOCULAR LENS PLACEMENT (IOC)-RIGHT DIABETIC;  Surgeon: Galen Manila, MD;  Location: [facility] ORS;  Service: Ophthalmology;  Laterality: Right;  [place] 00:47 AP% 24.5 CDE 11.62 Fluid pack lot # [id number]   CATARACT EXTRACTION W/PHACO Left 01/23/2017   Procedure: CATARACT EXTRACTION PHACO AND INTRAOCULAR LENS PLACEMENT (IOC);  Surgeon: Galen Manila, MD;  Location: [facility] ORS;  Service: Ophthalmology;  Laterality: Left;  [place] 00:50 AP% 16.1 CDE 8.18 Fluid pack lot #[id number]   COLONOSCOPY WITH PROPOFOL N/A 07/08/2019   Procedure: COLONOSCOPY WITH PROPOFOL;  Surgeon: Midge Minium, MD;  Location: [facility] ENDOSCOPY;  Service: Endoscopy;  Laterality: N/A;    Family History  Problem Relation Age of Onset   Heart disease Father    Heart disease Brother        s/p CABG   Breast cancer Daughter    Colon cancer Neg Hx     Allergies  Allergen Reactions   Penicillins Other (See Comments)    Unknown- pt states been a long  time ago  Has patient had a PCN reaction causing immediate rash, facial/tongue/throat swelling, SOB or lightheadedness with hypotension: Unknown Has patient had a PCN reaction causing severe rash involving mucus membranes or skin necrosis: Unknown Has patient had a PCN reaction that required hospitalization: Unknown Has patient had a PCN reaction occurring within the last 10 years: Unknown If  all of the above answers are "NO", then may proceed with Cephalosporin use.   Penicillin V Potassium Nausea And Vomiting       Latest Ref Rng & Units 03/31/2023    4:45 AM 03/24/2023    7:42 AM 03/23/2023   12:30 PM  CBC  WBC 4.0 - 10.5 K/uL 5.8  3.5  8.8   Hemoglobin 12.0 - 15.0 g/dL 16.1  09.6  04.5   Hematocrit 36.0 - 46.0 % 38.5  40.9  40.0   Platelets 150 - 400 K/uL 204  178  155       CMP     Component Value Date/Time   NA 132 (L) 04/02/2023 0601   NA 135 03/25/2020 1657   NA 136 07/16/2013 1413   K 4.1 03/31/2023 0445   K 3.6 07/16/2013 1413   CL 93 (L) 03/31/2023 0445   CL 104 07/16/2013 1413   CO2 23 03/31/2023 0445   CO2 24 07/16/2013 1413   GLUCOSE 105 (H) 03/31/2023 0445   GLUCOSE 128 (H) 07/16/2013 1413   BUN 30 (H) 03/31/2023 0445   BUN 12 03/25/2020 1657   BUN 10 07/16/2013 1413   CREATININE 0.69 03/31/2023 0445   CREATININE 0.99 07/16/2013 1413   CALCIUM 8.8 (L) 03/31/2023 0445   CALCIUM 9.2 07/16/2013 1413   PROT 6.6 03/19/2023 1100   PROT 6.5 03/25/2020 1657   PROT 7.6 04/25/2013 1538   ALBUMIN 4.2 03/19/2023 1100   ALBUMIN 4.0 03/25/2020 1657   ALBUMIN 3.9 04/25/2013 1538   AST 19 03/19/2023 1100   AST 19 04/25/2013 1538   ALT 19 03/19/2023 1100   ALT 28 04/25/2013 1538   ALKPHOS 71 03/19/2023 1100   ALKPHOS 44 (L) 04/25/2013 1538   BILITOT 0.4 03/19/2023 1100   BILITOT 0.4 03/25/2020 1657   BILITOT 0.4 04/25/2013 1538   GFR 83.12 03/19/2023 1100   GFRNONAA >60 03/31/2023 0445   GFRNONAA 56 (L) 07/16/2013 1413     No results found.      Assessment & Plan:   1. Leg swelling (Primary) The patient does have significant edema and I suspect there are some multifactoral factors.  In order to try to help gain control the swelling so that she can wear compression, will place the patient on bilateral Unna boots.  We have sent instructions to her nursing facility to change this on a weekly basis and we will have her return in 4 weeks for noninvasive studies.  Will have her undergo bilateral venous reflux studies in addition to evaluate the progress with swelling.  2. Type 2 diabetes mellitus with neurological complications (HCC) Continue hypoglycemic medications as already ordered, these medications have been reviewed and there are no changes at this time.  Hgb A1C to be monitored as already arranged by primary service  3. Essential hypertension Continue antihypertensive medications as already ordered, these medications have been reviewed and there are no changes at this time.   Current Outpatient Medications on File Prior to Visit  Medication Sig Dispense Refill   acetaminophen (TYLENOL) 325 MG tablet Take by mouth.     albuterol (VENTOLIN HFA) 108 (90 Base) MCG/ACT inhaler Inhale 1-2 puffs into the lungs every 6 (six) hours as needed for wheezing. 1 each 1   alum & mag hydroxide-simeth (MAALOX/MYLANTA) 200-200-20 MG/5ML suspension Take 15 mLs by mouth every 6 (six) hours as needed for indigestion or heartburn. 355 mL 0   amLODipine (NORVASC) 2.5 MG tablet Take 1 tablet (2.5 mg total)  by mouth daily. 90 tablet 3   aspirin EC 81 MG tablet Take 1 tablet (81 mg total) by mouth daily. Swallow whole. 30 tablet 12   busPIRone (BUSPAR) 5 MG tablet TAKE 1 TABLET(5 MG) BY MOUTH DAILY 30 tablet 1   diphenhydrAMINE (BENADRYL) 25 mg capsule Take 25 mg at bedtime as needed by mouth for sleep.      escitalopram (LEXAPRO) 10 MG tablet Take 10 mg by mouth daily.     feeding supplement (ENSURE ENLIVE / ENSURE PLUS) LIQD Take 237 mLs by mouth 2  (two) times daily between meals. 237 mL 12   ferrous sulfate (FEROSUL) 325 (65 FE) MG tablet Take 1 tablet (325 mg total) by mouth every other day. One tablet qod 45 tablet 3   furosemide (LASIX) 20 MG tablet 1 tab by mouth daily for 3 days then 1 tab every other day for 1 week. 30 tablet 1   glucose blood (CONTOUR TEST) test strip USE TO TEST BLOOD SUGAR ONCE DAILY 50 each 11   losartan (COZAAR) 25 MG tablet Take 1 tablet (25 mg total) by mouth daily. 30 tablet 2   metoprolol tartrate (LOPRESSOR) 25 MG tablet Take 25 mg by mouth daily.     Multiple Vitamin (MULTIVITAMIN WITH MINERALS) TABS tablet Take 1 tablet daily by mouth.     mupirocin ointment (BACTROBAN) 2 % Apply 1 Application topically 2 (two) times daily. 22 g 0   pantoprazole (PROTONIX) 40 MG tablet TAKE 1 TABLET(40 MG) BY MOUTH DAILY 90 tablet 3   simvastatin (ZOCOR) 10 MG tablet Take 1 tablet (10 mg total) by mouth daily. 90 tablet 3   No current facility-administered medications on file prior to visit.    There are no Patient Instructions on file for this visit. No follow-ups on file.   Georgiana Spinner, NP

## 2023-04-30 ENCOUNTER — Other Ambulatory Visit: Payer: Self-pay | Admitting: *Deleted

## 2023-04-30 NOTE — Patient Outreach (Signed)
 Post-Acute Care Manager follow up. Anita Walker resides in [facility].  Screening for potential complex care management services as benefit of health plan and primary care provider.  Collaboration with Terri, SNF social worker. Anita Walker continues to participate with therapy. Anticipated transition plan is to return home with daughter.   Will plan outreach to daughter Anita Walker to discuss complex care management services.   Raiford Noble, MSN, RN, BSN [place]  [facility], [facility] RN Post- Acute Care Manager Direct Dial: [phone]

## 2023-05-01 DIAGNOSIS — E7800 Pure hypercholesterolemia, unspecified: Secondary | ICD-10-CM | POA: Diagnosis not present

## 2023-05-01 DIAGNOSIS — I10 Essential (primary) hypertension: Secondary | ICD-10-CM | POA: Diagnosis not present

## 2023-05-01 DIAGNOSIS — I4891 Unspecified atrial fibrillation: Secondary | ICD-10-CM | POA: Diagnosis not present

## 2023-05-01 DIAGNOSIS — E119 Type 2 diabetes mellitus without complications: Secondary | ICD-10-CM | POA: Diagnosis not present

## 2023-05-01 DIAGNOSIS — E871 Hypo-osmolality and hyponatremia: Secondary | ICD-10-CM | POA: Diagnosis not present

## 2023-05-01 DIAGNOSIS — I5033 Acute on chronic diastolic (congestive) heart failure: Secondary | ICD-10-CM | POA: Diagnosis not present

## 2023-05-01 DIAGNOSIS — Z86711 Personal history of pulmonary embolism: Secondary | ICD-10-CM | POA: Diagnosis not present

## 2023-05-01 DIAGNOSIS — F419 Anxiety disorder, unspecified: Secondary | ICD-10-CM | POA: Diagnosis not present

## 2023-05-01 DIAGNOSIS — Z9181 History of falling: Secondary | ICD-10-CM | POA: Diagnosis not present

## 2023-05-07 DIAGNOSIS — I5022 Chronic systolic (congestive) heart failure: Secondary | ICD-10-CM | POA: Diagnosis not present

## 2023-05-07 DIAGNOSIS — I10 Essential (primary) hypertension: Secondary | ICD-10-CM | POA: Diagnosis not present

## 2023-05-07 DIAGNOSIS — K219 Gastro-esophageal reflux disease without esophagitis: Secondary | ICD-10-CM | POA: Diagnosis not present

## 2023-05-07 DIAGNOSIS — E7800 Pure hypercholesterolemia, unspecified: Secondary | ICD-10-CM | POA: Diagnosis not present

## 2023-05-07 DIAGNOSIS — R600 Localized edema: Secondary | ICD-10-CM | POA: Diagnosis not present

## 2023-05-07 DIAGNOSIS — E66811 Obesity, class 1: Secondary | ICD-10-CM | POA: Diagnosis not present

## 2023-05-07 DIAGNOSIS — Z853 Personal history of malignant neoplasm of breast: Secondary | ICD-10-CM | POA: Diagnosis not present

## 2023-05-07 DIAGNOSIS — G4733 Obstructive sleep apnea (adult) (pediatric): Secondary | ICD-10-CM | POA: Diagnosis not present

## 2023-05-07 DIAGNOSIS — R0602 Shortness of breath: Secondary | ICD-10-CM | POA: Diagnosis not present

## 2023-05-07 DIAGNOSIS — E119 Type 2 diabetes mellitus without complications: Secondary | ICD-10-CM | POA: Diagnosis not present

## 2023-05-10 ENCOUNTER — Encounter (INDEPENDENT_AMBULATORY_CARE_PROVIDER_SITE_OTHER): Payer: Medicare Other | Admitting: Nurse Practitioner

## 2023-05-11 ENCOUNTER — Ambulatory Visit: Payer: Medicare Other | Admitting: Podiatry

## 2023-05-15 DIAGNOSIS — E119 Type 2 diabetes mellitus without complications: Secondary | ICD-10-CM | POA: Diagnosis not present

## 2023-05-15 DIAGNOSIS — I4891 Unspecified atrial fibrillation: Secondary | ICD-10-CM | POA: Diagnosis not present

## 2023-05-15 DIAGNOSIS — E871 Hypo-osmolality and hyponatremia: Secondary | ICD-10-CM | POA: Diagnosis not present

## 2023-05-15 DIAGNOSIS — I5033 Acute on chronic diastolic (congestive) heart failure: Secondary | ICD-10-CM | POA: Diagnosis not present

## 2023-05-15 DIAGNOSIS — I10 Essential (primary) hypertension: Secondary | ICD-10-CM | POA: Diagnosis not present

## 2023-05-17 DIAGNOSIS — F411 Generalized anxiety disorder: Secondary | ICD-10-CM | POA: Diagnosis not present

## 2023-05-17 DIAGNOSIS — F331 Major depressive disorder, recurrent, moderate: Secondary | ICD-10-CM | POA: Diagnosis not present

## 2023-05-22 ENCOUNTER — Other Ambulatory Visit: Payer: Self-pay | Admitting: *Deleted

## 2023-05-22 DIAGNOSIS — M6281 Muscle weakness (generalized): Secondary | ICD-10-CM | POA: Diagnosis not present

## 2023-05-22 DIAGNOSIS — R2689 Other abnormalities of gait and mobility: Secondary | ICD-10-CM | POA: Diagnosis not present

## 2023-05-22 DIAGNOSIS — F419 Anxiety disorder, unspecified: Secondary | ICD-10-CM | POA: Diagnosis not present

## 2023-05-22 DIAGNOSIS — I5033 Acute on chronic diastolic (congestive) heart failure: Secondary | ICD-10-CM | POA: Diagnosis not present

## 2023-05-22 NOTE — Patient Outreach (Signed)
 Per [facility], Anita Walker resides in [facility].  Screening for potential complex care management services as benefit of health plan and primary care provider.  Telephone call made to daughter/DPR Anita Walker [phone] to discuss complex care management services. However, call was dropped/disconnected. Secure message and voicemail left for Alanda Amass social worker to inquire about transition plans/needs.   Will continue to follow.   Raiford Noble, MSN, RN, BSN [place]  [facility], [facility] Commun[facility] RN Post- Acute Care Manager Direct Dial: [phone]

## 2023-05-25 DIAGNOSIS — R2689 Other abnormalities of gait and mobility: Secondary | ICD-10-CM | POA: Diagnosis not present

## 2023-05-25 DIAGNOSIS — M6281 Muscle weakness (generalized): Secondary | ICD-10-CM | POA: Diagnosis not present

## 2023-05-25 DIAGNOSIS — I5033 Acute on chronic diastolic (congestive) heart failure: Secondary | ICD-10-CM | POA: Diagnosis not present

## 2023-05-25 DIAGNOSIS — F419 Anxiety disorder, unspecified: Secondary | ICD-10-CM | POA: Diagnosis not present

## 2023-05-29 ENCOUNTER — Other Ambulatory Visit (INDEPENDENT_AMBULATORY_CARE_PROVIDER_SITE_OTHER): Payer: Self-pay | Admitting: Nurse Practitioner

## 2023-05-29 DIAGNOSIS — M6281 Muscle weakness (generalized): Secondary | ICD-10-CM | POA: Diagnosis not present

## 2023-05-29 DIAGNOSIS — M7989 Other specified soft tissue disorders: Secondary | ICD-10-CM

## 2023-05-29 DIAGNOSIS — I5033 Acute on chronic diastolic (congestive) heart failure: Secondary | ICD-10-CM | POA: Diagnosis not present

## 2023-05-29 DIAGNOSIS — F419 Anxiety disorder, unspecified: Secondary | ICD-10-CM | POA: Diagnosis not present

## 2023-05-29 DIAGNOSIS — R2689 Other abnormalities of gait and mobility: Secondary | ICD-10-CM | POA: Diagnosis not present

## 2023-05-30 ENCOUNTER — Encounter (INDEPENDENT_AMBULATORY_CARE_PROVIDER_SITE_OTHER): Payer: Medicare Other

## 2023-05-30 ENCOUNTER — Ambulatory Visit (INDEPENDENT_AMBULATORY_CARE_PROVIDER_SITE_OTHER): Payer: Medicare Other | Admitting: Nurse Practitioner

## 2023-05-31 DIAGNOSIS — E7800 Pure hypercholesterolemia, unspecified: Secondary | ICD-10-CM | POA: Diagnosis not present

## 2023-05-31 DIAGNOSIS — I4891 Unspecified atrial fibrillation: Secondary | ICD-10-CM | POA: Diagnosis not present

## 2023-05-31 DIAGNOSIS — Z86711 Personal history of pulmonary embolism: Secondary | ICD-10-CM | POA: Diagnosis not present

## 2023-05-31 DIAGNOSIS — I10 Essential (primary) hypertension: Secondary | ICD-10-CM | POA: Diagnosis not present

## 2023-05-31 DIAGNOSIS — E871 Hypo-osmolality and hyponatremia: Secondary | ICD-10-CM | POA: Diagnosis not present

## 2023-05-31 DIAGNOSIS — F411 Generalized anxiety disorder: Secondary | ICD-10-CM | POA: Diagnosis not present

## 2023-05-31 DIAGNOSIS — I5033 Acute on chronic diastolic (congestive) heart failure: Secondary | ICD-10-CM | POA: Diagnosis not present

## 2023-05-31 DIAGNOSIS — Z9181 History of falling: Secondary | ICD-10-CM | POA: Diagnosis not present

## 2023-05-31 DIAGNOSIS — E119 Type 2 diabetes mellitus without complications: Secondary | ICD-10-CM | POA: Diagnosis not present

## 2023-05-31 DIAGNOSIS — F419 Anxiety disorder, unspecified: Secondary | ICD-10-CM | POA: Diagnosis not present

## 2023-05-31 DIAGNOSIS — F331 Major depressive disorder, recurrent, moderate: Secondary | ICD-10-CM | POA: Diagnosis not present

## 2023-06-05 ENCOUNTER — Other Ambulatory Visit
Admission: RE | Admit: 2023-06-05 | Discharge: 2023-06-05 | Disposition: A | Discharge status: Home / Self Care | Source: Ambulatory Visit | Attending: Internal Medicine | Admitting: Internal Medicine

## 2023-06-05 ENCOUNTER — Ambulatory Visit: Payer: Medicare Other | Admitting: Internal Medicine

## 2023-06-05 DIAGNOSIS — K219 Gastro-esophageal reflux disease without esophagitis: Secondary | ICD-10-CM | POA: Diagnosis not present

## 2023-06-05 DIAGNOSIS — R0602 Shortness of breath: Secondary | ICD-10-CM | POA: Diagnosis not present

## 2023-06-05 DIAGNOSIS — R600 Localized edema: Secondary | ICD-10-CM | POA: Diagnosis not present

## 2023-06-05 DIAGNOSIS — E66812 Obesity, class 2: Secondary | ICD-10-CM | POA: Diagnosis not present

## 2023-06-05 DIAGNOSIS — G4733 Obstructive sleep apnea (adult) (pediatric): Secondary | ICD-10-CM | POA: Diagnosis not present

## 2023-06-05 DIAGNOSIS — M6281 Muscle weakness (generalized): Secondary | ICD-10-CM | POA: Diagnosis not present

## 2023-06-05 DIAGNOSIS — F419 Anxiety disorder, unspecified: Secondary | ICD-10-CM | POA: Diagnosis not present

## 2023-06-05 DIAGNOSIS — I5022 Chronic systolic (congestive) heart failure: Secondary | ICD-10-CM | POA: Diagnosis not present

## 2023-06-05 DIAGNOSIS — I10 Essential (primary) hypertension: Secondary | ICD-10-CM | POA: Diagnosis not present

## 2023-06-05 DIAGNOSIS — E7800 Pure hypercholesterolemia, unspecified: Secondary | ICD-10-CM | POA: Diagnosis not present

## 2023-06-05 DIAGNOSIS — R2689 Other abnormalities of gait and mobility: Secondary | ICD-10-CM | POA: Diagnosis not present

## 2023-06-05 DIAGNOSIS — Z6835 Body mass index (BMI) 35.0-35.9, adult: Secondary | ICD-10-CM | POA: Diagnosis not present

## 2023-06-05 DIAGNOSIS — I5033 Acute on chronic diastolic (congestive) heart failure: Secondary | ICD-10-CM | POA: Diagnosis not present

## 2023-06-05 DIAGNOSIS — E119 Type 2 diabetes mellitus without complications: Secondary | ICD-10-CM | POA: Diagnosis not present

## 2023-06-05 LAB — BRAIN NATRIURETIC PEPTIDE: B Natriuretic Peptide: 374.9 pg/mL — ABNORMAL HIGH (ref 0.0–100.0)

## 2023-06-07 DIAGNOSIS — E119 Type 2 diabetes mellitus without complications: Secondary | ICD-10-CM | POA: Diagnosis not present

## 2023-06-07 DIAGNOSIS — F419 Anxiety disorder, unspecified: Secondary | ICD-10-CM | POA: Diagnosis not present

## 2023-06-07 DIAGNOSIS — Z86711 Personal history of pulmonary embolism: Secondary | ICD-10-CM | POA: Diagnosis not present

## 2023-06-07 DIAGNOSIS — I10 Essential (primary) hypertension: Secondary | ICD-10-CM | POA: Diagnosis not present

## 2023-06-07 DIAGNOSIS — E7800 Pure hypercholesterolemia, unspecified: Secondary | ICD-10-CM | POA: Diagnosis not present

## 2023-06-07 DIAGNOSIS — E871 Hypo-osmolality and hyponatremia: Secondary | ICD-10-CM | POA: Diagnosis not present

## 2023-06-07 DIAGNOSIS — I5033 Acute on chronic diastolic (congestive) heart failure: Secondary | ICD-10-CM | POA: Diagnosis not present

## 2023-06-07 DIAGNOSIS — I4891 Unspecified atrial fibrillation: Secondary | ICD-10-CM | POA: Diagnosis not present

## 2023-06-07 DIAGNOSIS — Z9181 History of falling: Secondary | ICD-10-CM | POA: Diagnosis not present

## 2023-06-11 ENCOUNTER — Other Ambulatory Visit: Payer: Self-pay | Admitting: *Deleted

## 2023-06-11 ENCOUNTER — Telehealth: Payer: Self-pay

## 2023-06-11 DIAGNOSIS — I5033 Acute on chronic diastolic (congestive) heart failure: Secondary | ICD-10-CM

## 2023-06-11 NOTE — Progress Notes (Signed)
 Post- Acute Care Manager follow up.   Screening for potential complex care management services as benefit of health plan and primary care provider.  Collaboration with [facility] social worker. Anita Walker returned home with daughter on 06/08/23. Will have [facility].   Anita Walker has medical history of CHF, DM, HTN. Will refer to [facility] complex care management team.   Raiford Noble, MSN, RN, BSN [place]  [facility] RN Post- Acute Care Manager Direct Dial: [phone]

## 2023-06-11 NOTE — Transitions of Care (Post Inpatient/ED Visit) (Signed)
 06/11/2023  Name: Anita Walker MRN: [id number] DOB: 1940-10-21  Today's TOC FU Call Status: Today's TOC FU Call Status:: Successful TOC FU Call Completed TOC FU Call Complete Date: 06/11/23 Patient's Name and Date of Birth confirmed.  Transition Care Management Follow-up Telephone Call Date of Discharge: 06/08/23 Discharge Facility: Other ([facility]) Name of Other ([facility]) Discharge Facility: [facility] Type of Discharge: Inpatient Admission Primary Inpatient Discharge Diagnosis:: kissing spine How have you been since you were released from the hospital?: Better Any questions or concerns?: No  Items Reviewed: Did you receive and understand the discharge instructions provided?: Yes Medications obtained,verified, and reconciled?: Yes (Medications Reviewed) Any new allergies since your discharge?: No Dietary orders reviewed?: Yes Do you have support at home?: Yes Name of Support/Comfort Primary Source: caregiver  Medications Reviewed Today: Medications Reviewed Today     Reviewed by Karena Addison, LPN (Licensed Practical Nurse) on 06/11/23 at 1059  Med List Status: <None>   Medication Order Taking? Sig Documenting Provider Last Dose Status Informant  acetaminophen (TYLENOL) 325 MG tablet [id number] No Take by mouth. [provider] Taking Active   albuterol (VENTOLIN HFA) 108 (90 Base) MCG/ACT inhaler [id number] No Inhale 1-2 puffs into the lungs every 6 (six) hours as needed for wheezing. Dale [place], MD Taking Active Child  alum & mag hydroxide-simeth (MAALOX/MYLANTA) 200-200-20 MG/5ML suspension [id number] No Take 15 mLs by mouth every 6 (six) hours as needed for indigestion or heartburn. Marcelino Duster, MD Taking Active   amLODipine (NORVASC) 2.5 MG tablet [id number] No Take 1 tablet (2.5 mg total) by mouth daily. Dale [place], MD Taking Active Child  aspirin EC 81 MG tablet [id number] No Take 1 tablet (81 mg total) by mouth daily. Swallow  whole. Marcelino Duster, MD Taking Active   busPIRone (BUSPAR) 5 MG tablet [id number] No TAKE 1 TABLET(5 MG) BY MOUTH DAILY Dale [place], MD Taking Active Child  diphenhydrAMINE (BENADRYL) 25 mg capsule [id number] No Take 25 mg at bedtime as needed by mouth for sleep.  [provider] Taking Active Child  escitalopram (LEXAPRO) 10 MG tablet [id number] No Take 10 mg by mouth daily. [provider] Taking Active   feeding supplement (ENSURE ENLIVE / ENSURE PLUS) LIQD [id number] No Take 237 mLs by mouth 2 (two) times daily between meals. Enedina Finner, MD Taking Active Child  ferrous sulfate (FEROSUL) 325 (65 FE) MG tablet [id number] No Take 1 tablet (325 mg total) by mouth every other day. One tablet qod Dale , MD Taking Active Child  furosemide (LASIX) 20 MG tablet [id number] No 1 tab by mouth daily for 3 days then 1 tab every other day for 1 week. Marcelino Duster, MD Taking Active   glucose blood (CONTOUR TEST) test strip [id number] No USE TO TEST BLOOD SUGAR ONCE DAILY Dale , MD Taking Active Child  losartan (COZAAR) 25 MG tablet [id number] No Take 1 tablet (25 mg total) by mouth daily. Marcelino Duster, MD Taking Active   metoprolol tartrate (LOPRESSOR) 25 MG tablet [id number] No Take 25 mg by mouth daily. [provider] Taking Active   Multiple Vitamin (MULTIVITAMIN WITH MINERALS) TABS tablet [id number] No Take 1 tablet daily by mouth. [provider] Taking Active Child  mupirocin ointment (BACTROBAN) 2 % [id number] No Apply 1 Application topically 2 (two) times daily. Shirlee Latch, PA-C Taking Active Child           Med Note Sharia Reeve   IRC Mar 24, 2023 12:44 PM) prn  pantoprazole (PROTONIX)  40 MG tablet [id number] No TAKE 1 TABLET(40 MG) BY MOUTH DAILY Dale [place], MD Taking Active Child  simvastatin (ZOCOR) 10 MG tablet [id number] No Take 1 tablet (10 mg total) by mouth daily. Dale [place], MD Taking Active Child             Home Care and Equipment/Supplies: Were Home Health Services Ordered?: NA Any new equipment or medical supplies ordered?: NA  Functional Questionnaire: Do you need assistance with bathing/showering or dressing?: No Do you need assistance with meal preparation?: Yes Do you need assistance with eating?: No Do you have difficulty maintaining continence: No Do you need assistance with getting out of bed/getting out of a chair/moving?: Yes Do you have difficulty managing or taking your medications?: Yes  Follow up appointments reviewed: PCP Follow-up appointment confirmed?: Yes Date of PCP follow-up appointment?: 06/13/23 Follow-up Provider: [facility] Follow-up appointment confirmed?: NA Do you need transportation to your follow-up appointment?: No Do you understand care options if your condition(s) worsen?: Yes-patient verbalized understanding    SIGNATURE Karena Addison, LPN [facility] Nurse Health Advisor Direct Dial [phone]

## 2023-06-12 ENCOUNTER — Telehealth: Payer: Self-pay

## 2023-06-12 ENCOUNTER — Telehealth: Payer: Self-pay | Admitting: *Deleted

## 2023-06-12 DIAGNOSIS — I110 Hypertensive heart disease with heart failure: Secondary | ICD-10-CM | POA: Diagnosis not present

## 2023-06-12 DIAGNOSIS — R262 Difficulty in walking, not elsewhere classified: Secondary | ICD-10-CM | POA: Diagnosis not present

## 2023-06-12 DIAGNOSIS — I5033 Acute on chronic diastolic (congestive) heart failure: Secondary | ICD-10-CM | POA: Diagnosis not present

## 2023-06-12 DIAGNOSIS — Z6833 Body mass index (BMI) 33.0-33.9, adult: Secondary | ICD-10-CM | POA: Diagnosis not present

## 2023-06-12 DIAGNOSIS — E119 Type 2 diabetes mellitus without complications: Secondary | ICD-10-CM | POA: Diagnosis not present

## 2023-06-12 DIAGNOSIS — Z7982 Long term (current) use of aspirin: Secondary | ICD-10-CM | POA: Diagnosis not present

## 2023-06-12 DIAGNOSIS — Z79899 Other long term (current) drug therapy: Secondary | ICD-10-CM | POA: Diagnosis not present

## 2023-06-12 DIAGNOSIS — Z556 Problems related to health literacy: Secondary | ICD-10-CM | POA: Diagnosis not present

## 2023-06-12 DIAGNOSIS — I4891 Unspecified atrial fibrillation: Secondary | ICD-10-CM | POA: Diagnosis not present

## 2023-06-12 DIAGNOSIS — I10 Essential (primary) hypertension: Secondary | ICD-10-CM | POA: Diagnosis not present

## 2023-06-12 DIAGNOSIS — Z9181 History of falling: Secondary | ICD-10-CM | POA: Diagnosis not present

## 2023-06-12 DIAGNOSIS — E871 Hypo-osmolality and hyponatremia: Secondary | ICD-10-CM | POA: Diagnosis not present

## 2023-06-12 DIAGNOSIS — R296 Repeated falls: Secondary | ICD-10-CM | POA: Diagnosis not present

## 2023-06-12 DIAGNOSIS — E669 Obesity, unspecified: Secondary | ICD-10-CM | POA: Diagnosis not present

## 2023-06-12 DIAGNOSIS — F419 Anxiety disorder, unspecified: Secondary | ICD-10-CM | POA: Diagnosis not present

## 2023-06-12 DIAGNOSIS — Z86711 Personal history of pulmonary embolism: Secondary | ICD-10-CM | POA: Diagnosis not present

## 2023-06-12 DIAGNOSIS — E7800 Pure hypercholesterolemia, unspecified: Secondary | ICD-10-CM | POA: Diagnosis not present

## 2023-06-12 DIAGNOSIS — M6281 Muscle weakness (generalized): Secondary | ICD-10-CM | POA: Diagnosis not present

## 2023-06-12 NOTE — Progress Notes (Signed)
 Complex Care Management Note  Care Guide Note 06/12/2023 Name: Anita Walker MRN: [id number] DOB: 04-19-1940  Anita Walker is a 83 y.o. year old female who sees Dale Dayton, MD for primary care. I reached out to [name] by phone today to offer complex care management services.  [name] was given information about Complex Care Management services today including:   The Complex Care Management services include support from the care team which includes your Nurse Care Manager, Clinical Social Worker, or Pharmacist.  The Complex Care Management team is here to help remove barriers to the health concerns and goals most important to you. Complex Care Management services are voluntary, and the patient may decline or stop services at any time by request to their care team member.   Complex Care Management Consent Status: Patient agreed to services and verbal consent obtained.   Follow up plan:  Telephone appointment with complex care management team member scheduled for:  06/22/2023  Encounter Outcome:  Patient Scheduled  Burman Nieves, CMA [place]  [facility], [facility] Guide Direct Dial: [phone]  Fax: [phone] Website: [email or web address]

## 2023-06-12 NOTE — Telephone Encounter (Signed)
 Ok

## 2023-06-12 NOTE — Telephone Encounter (Signed)
 Copied from CRM [id number]. Topic: Clinical - Home Health Verbal Orders >> Jun 12, 2023  1:24 PM Gurney Maxin H wrote: Caller/Agency: [facility] Callback Number: [phone] Secure Line Service Requested: Physical Therapy and Occupational Therapy Frequency: Evaluation, Nursing twice this week once a week for 8 weeks, put in for home health aide but patient has a caregiver if can be done twice a week for 9 weeks Any new concerns about the patient? No, has follow up with [facility] will continue unna boots once a week patient sees [facility]

## 2023-06-12 NOTE — Telephone Encounter (Signed)
 Ok to give verbals for Pt/OT evaluation and Nursing 2x a week for 9 weeks? Pt has appt tomorrow.

## 2023-06-13 ENCOUNTER — Ambulatory Visit: Admitting: Internal Medicine

## 2023-06-13 VITALS — BP 106/70 | HR 82 | Ht 67.0 in | Wt 223.0 lb

## 2023-06-13 DIAGNOSIS — I4891 Unspecified atrial fibrillation: Secondary | ICD-10-CM | POA: Diagnosis not present

## 2023-06-13 DIAGNOSIS — I10 Essential (primary) hypertension: Secondary | ICD-10-CM | POA: Diagnosis not present

## 2023-06-13 DIAGNOSIS — E1149 Type 2 diabetes mellitus with other diabetic neurological complication: Secondary | ICD-10-CM

## 2023-06-13 DIAGNOSIS — G319 Degenerative disease of nervous system, unspecified: Secondary | ICD-10-CM

## 2023-06-13 DIAGNOSIS — Z853 Personal history of malignant neoplasm of breast: Secondary | ICD-10-CM

## 2023-06-13 DIAGNOSIS — I5033 Acute on chronic diastolic (congestive) heart failure: Secondary | ICD-10-CM | POA: Diagnosis not present

## 2023-06-13 DIAGNOSIS — R531 Weakness: Secondary | ICD-10-CM | POA: Diagnosis not present

## 2023-06-13 DIAGNOSIS — E7800 Pure hypercholesterolemia, unspecified: Secondary | ICD-10-CM | POA: Diagnosis not present

## 2023-06-13 DIAGNOSIS — R600 Localized edema: Secondary | ICD-10-CM

## 2023-06-13 DIAGNOSIS — D649 Anemia, unspecified: Secondary | ICD-10-CM | POA: Diagnosis not present

## 2023-06-13 DIAGNOSIS — M7989 Other specified soft tissue disorders: Secondary | ICD-10-CM | POA: Diagnosis not present

## 2023-06-13 DIAGNOSIS — E871 Hypo-osmolality and hyponatremia: Secondary | ICD-10-CM | POA: Diagnosis not present

## 2023-06-13 DIAGNOSIS — E041 Nontoxic single thyroid nodule: Secondary | ICD-10-CM | POA: Diagnosis not present

## 2023-06-13 DIAGNOSIS — E1142 Type 2 diabetes mellitus with diabetic polyneuropathy: Secondary | ICD-10-CM | POA: Diagnosis not present

## 2023-06-13 DIAGNOSIS — I110 Hypertensive heart disease with heart failure: Secondary | ICD-10-CM | POA: Diagnosis not present

## 2023-06-13 DIAGNOSIS — E119 Type 2 diabetes mellitus without complications: Secondary | ICD-10-CM | POA: Diagnosis not present

## 2023-06-13 NOTE — Telephone Encounter (Deleted)
 Patient scheduled for an appointment with provider today.

## 2023-06-13 NOTE — Patient Instructions (Signed)
 Can take tylenol arthritis - you can take 2 tablets 2x/day as needed.   Can use salonpas - patch

## 2023-06-13 NOTE — Progress Notes (Signed)
 Subjective:    [name], female    DOB: 11-20-1940, 83 y.o.   MRN: [id number]  Patient here for hospital follow up.   HPI Here for hospital/rehab follow up. Admitted 03/23/23 - 04/02/23 - after presenting with hallucinations, weakness, cough and wheezing. Also fall three days prior. Diagnosed with metabolic encephalopathy, acute bronchitis, acute on chronic hyponatremia, acute on chronic diastolic CHF. CT chest - findings suspicious for pulmonary edema. 2D echo showed EF estimated at 50 to 55%, mild concentric LVH, indeterminate LV Parameters, mild MR, mild to moderate TR. Treated with IV lasix and steroids. Discharged on oral lasix. Venous duplex of lower extremity - negative. Sodium remained 127-132. Cough and wheezing (suspected acute bronchitis) - treated with steroids. MRI did not show any evidence of acute stroke. PT evaluated and recommended SNF.  Saw Dr Anita Walker 04/09/23 - recommended referral to pulmonary. Also recommended HST. Saw Dr Anita Walker 04/23/23 - spirometry - obstruction, suspect COPD. Recommended albuterol with aero chamber, trelegy and serial echo. Also recommended sleep study and thyroid ultrasound (f/u thyroid nodule). Saw [facility] 04/27/23 - lower extremity swelling - unna wraps placed. Had f/u with cardiology 05/07/23 - started on torsemide. Lasix and amlodipine stopped. Also f/u 06/05/23. Cxr ordered. CXR - No consolidation, infiltrate, effusion or edema.  Cardiac silhouette, is  enlarged osseous structures and soft tissues are unremarkable.  Large retrocardiac focus within air-fluid level consistent with hiatal hernia. She comes in today accompanied by her caretaker and her daughter. History obtained from all of them. She was discharged home 06/08/23. Breathing is stable. Denies any increased sob. No chest pain. Daughter reported some back pain. Had been discharged from rehab with tramadol. Discussed back pain with [name]. Reports back pain - when first stands and then resolves  once moving. Has apparently noticed some when sitting - per daughter and caretaker. Using a heating pad. Discussed scheduled tylenol and holding on tramadol given on SSRI. Eating. No vomiting. Planning to start [facility]. Discussed the need to work with PT and do exercises they give her.    Past Medical History:  Diagnosis Date   Anemia    Arthritis    Atrial fibrillation (HCC)    Breast cancer (HCC)    s/p lumpectomy 1992.  s/p chemo and xrt left breast   Diabetes mellitus (HCC)    Edema    feet/legs   Gastric ulcer    GERD (gastroesophageal reflux disease)    HOH (hard of hearing)    aides   Hypercholesterolemia    Hypertension    Personal history of chemotherapy    Pulmonary emboli [facility])    Past Surgical History:  Procedure Laterality Date   BREAST LUMPECTOMY  03/06/1990   left breast   CATARACT EXTRACTION W/PHACO Right 12/26/2016   Procedure: CATARACT EXTRACTION PHACO AND INTRAOCULAR LENS PLACEMENT (IOC)-RIGHT DIABETIC;  Surgeon: Clair Crews, MD;  Location: [facility] ORS;  Service: Ophthalmology;  Laterality: Right;  US  00:47 AP% 24.5 CDE 11.62 Fluid pack lot # [id number]   CATARACT EXTRACTION W/PHACO Left 01/23/2017   Procedure: CATARACT EXTRACTION PHACO AND INTRAOCULAR LENS PLACEMENT (IOC);  Surgeon: Clair Crews, MD;  Location: [facility] ORS;  Service: Ophthalmology;  Laterality: Left;  US  00:50 AP% 16.1 CDE 8.18 Fluid pack lot #[id number]   COLONOSCOPY WITH PROPOFOL N/A 07/08/2019   Procedure: COLONOSCOPY WITH PROPOFOL;  Surgeon: Marnee Sink, MD;  Location: [facility] ENDOSCOPY;  Service: Endoscopy;  Laterality: N/A;   Family History  Problem Relation Age of Onset  Heart disease Father    Heart disease Brother        s/p CABG   Breast cancer Daughter    Colon cancer Neg Hx    Social History   Socioeconomic History   Marital status: Widowed    Spouse name: Not on file   Number of children: Not on file   Years of education: Not on file   Highest education level: 10th  grade  Occupational History   Not on file  Tobacco Use   Smoking status: Never   Smokeless tobacco: Never  Vaping Use   Vaping status: Never Used  Substance and Sexual Activity   Alcohol use: No    Alcohol/week: 0.0 standard drinks of alcohol   Drug use: No   Sexual activity: Never  Other Topics Concern   Not on file  Social History Narrative   Not on file   Social Drivers of Health   Financial Resource Strain: Low Risk  (04/23/2023)   [facility]   Overall Financial Resource Strain (CARDIA)    Difficulty of Paying Living Expenses: Not hard at all  Food Insecurity: No Food Insecurity (04/23/2023)   [facility] from [facility]   Hunger Vital Sign    Worried About Running Out of Food in the Last Year: Never true    Ran Out of Food in the Last Year: Never true  Transportation Needs: No Transportation Needs (04/23/2023)   Received [facility] - Transportation    In the past 12 months, has lack of transportation kept you from medical appointments or from getting medications?: No    Lack of Transportation (Non-Medical): No  Physical Activity: Unknown (12/22/2022)   Exercise Vital Sign    Days of Exercise per Week: 0 days    Minutes of Exercise per Session: Not on file  Recent Concern: Physical Activity - Inactive (12/22/2022)   Exercise Vital Sign    Days of Exercise per Week: 0 days    Minutes of Exercise per Session: 20 min  Stress: No Stress Concern Present (12/22/2022)   [employer] of Occupational Health - Occupational Stress Questionnaire    Feeling of Stress : Only a little  Social Connections: Socially Isolated (03/26/2023)   Social Connection and Isolation Panel [NHANES]    Frequency of Communication with Friends and Family: Twice a week    Frequency of Social Gatherings with Friends and Family: Twice a week    Attends Religious Services: Never    Database administrator or  Organizations: No    Attends Banker Meetings: Never    Marital Status: Widowed     Review of Systems  Constitutional:  Negative for appetite change and unexpected weight change.  HENT:  Negative for congestion and sinus pressure.   Respiratory:  Negative for cough and chest tightness.        Breathing stable.   Cardiovascular:  Negative for chest pain and palpitations.       Legs wrapped. Swelling improved.   Gastrointestinal:  Negative for abdominal pain, diarrhea, nausea and vomiting.  Genitourinary:  Negative for difficulty urinating and dysuria.  Musculoskeletal:  Positive for back pain. Negative for joint swelling and myalgias.  Skin:  Negative for color change and rash.  Neurological:  Negative for dizziness and headaches.  Psychiatric/Behavioral:  Negative for agitation and dysphoric mood.        Objective:     BP 106/70  Pulse 82   Ht 5\' 7"  (1.702 m)   Wt 223 lb (101.2 kg)   SpO2 97%   BMI 34.93 kg/m  Wt Readings from Last 3 Encounters:  06/17/23 223 lb (101.2 kg)  04/27/23 247 lb (112 kg)  04/02/23 247 lb 12.8 oz (112.4 kg)    Physical Exam Vitals reviewed.  Constitutional:      General: She is not in acute distress.    Appearance: Normal appearance.  HENT:     Head: Normocephalic and atraumatic.     Right Ear: External ear normal.     Left Ear: External ear normal.     Mouth/Throat:     Pharynx: No oropharyngeal exudate or posterior oropharyngeal erythema.  Eyes:     General: No scleral icterus.       Right eye: No discharge.        Left eye: No discharge.     Conjunctiva/sclera: Conjunctivae normal.  Neck:     Thyroid: No thyromegaly.  Cardiovascular:     Rate and Rhythm: Normal rate and regular rhythm.  Pulmonary:     Effort: No respiratory distress.     Breath sounds: Normal breath sounds. No wheezing.  Abdominal:     General: Bowel sounds are normal.     Palpations: Abdomen is soft.     Tenderness: There is no abdominal  tenderness.  Musculoskeletal:        General: No tenderness.     Cervical back: Neck supple. No tenderness.     Comments: Lower extremities - wrapped.   Lymphadenopathy:     Cervical: No cervical adenopathy.  Skin:    Findings: No erythema or rash.  Neurological:     Mental Status: She is alert.  Psychiatric:        Mood and Affect: Mood normal.        Behavior: Behavior normal.         Outpatient Encounter Medications as of 06/13/2023  Medication Sig   losartan (COZAAR) 50 MG tablet Take 50 mg by mouth daily.   torsemide (DEMADEX) 20 MG tablet Take 20 mg by mouth daily.   acetaminophen (TYLENOL) 325 MG tablet Take by mouth.   albuterol (VENTOLIN HFA) 108 (90 Base) MCG/ACT inhaler Inhale 1-2 puffs into the lungs every 6 (six) hours as needed for wheezing.   alum & mag hydroxide-simeth (MAALOX/MYLANTA) 200-200-20 MG/5ML suspension Take 15 mLs by mouth every 6 (six) hours as needed for indigestion or heartburn.   aspirin EC 81 MG tablet Take 1 tablet (81 mg total) by mouth daily. Swallow whole.   diphenhydrAMINE (BENADRYL) 25 mg capsule Take 25 mg at bedtime as needed by mouth for sleep.    escitalopram (LEXAPRO) 10 MG tablet Take 10 mg by mouth daily.   feeding supplement (ENSURE ENLIVE / ENSURE PLUS) LIQD Take 237 mLs by mouth 2 (two) times daily between meals.   ferrous sulfate (FEROSUL) 325 (65 FE) MG tablet Take 1 tablet (325 mg total) by mouth every other day. One tablet qod   fluticasone (FLONASE) 50 MCG/ACT nasal spray Place 2 sprays into both nostrils daily.   glucose blood (CONTOUR TEST) test strip USE TO TEST BLOOD SUGAR ONCE DAILY   metoprolol tartrate (LOPRESSOR) 25 MG tablet Take 25 mg by mouth daily.   Multiple Vitamin (MULTIVITAMIN WITH MINERALS) TABS tablet Take 1 tablet daily by mouth.   pantoprazole (PROTONIX) 40 MG tablet TAKE 1 TABLET(40 MG) BY MOUTH DAILY   [DISCONTINUED] amLODipine (NORVASC) 2.5 MG  tablet Take 1 tablet (2.5 mg total) by mouth daily.    [DISCONTINUED] busPIRone (BUSPAR) 5 MG tablet TAKE 1 TABLET(5 MG) BY MOUTH DAILY   [DISCONTINUED] furosemide (LASIX) 20 MG tablet 1 tab by mouth daily for 3 days then 1 tab every other day for 1 week.   [DISCONTINUED] losartan (COZAAR) 25 MG tablet Take 1 tablet (25 mg total) by mouth daily.   [DISCONTINUED] mupirocin ointment (BACTROBAN) 2 % Apply 1 Application topically 2 (two) times daily.   [DISCONTINUED] simvastatin (ZOCOR) 10 MG tablet Take 1 tablet (10 mg total) by mouth daily.   No facility-administered encounter medications on file as of 06/13/2023.    Medications reconciled with pt.   Lab Results  Component Value Date   WBC 5.8 03/31/2023   HGB 13.5 03/31/2023   HCT 38.5 03/31/2023   PLT 204 03/31/2023   GLUCOSE 105 (H) 03/31/2023   CHOL 155 03/19/2023   TRIG 62.0 03/19/2023   HDL 89.70 03/19/2023   LDLCALC 53 03/19/2023   ALT 19 03/19/2023   AST 19 03/19/2023   NA 132 (L) 04/02/2023   K 4.1 03/31/2023   CL 93 (L) 03/31/2023   CREATININE 0.69 03/31/2023   BUN 30 (H) 03/31/2023   CO2 23 03/31/2023   TSH 5.83 (H) 03/19/2023   INR 1.2 (H) 07/22/2020   HGBA1C 6.0 03/19/2023   MICROALBUR 1.6 03/19/2023       Assessment & Plan:  Anemia, unspecified type Assessment & Plan: Follow cbc and ferritin. Recent check hgb 16.1,    Bilateral leg edema Assessment & Plan: Seeing [facility]. Legs wrapped. Arrange earlier f/u. Swelling overall improved.    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  Mild.      Diabetic polyneuropathy associated with type 2 diabetes mellitus (HCC) Assessment & Plan: Sugars have been well controlled.  Continue low carb diet and exercise.  PT to work with her - to strengthen and increased activity. Follow met b and A1c.  Lab Results  Component Value Date   HGBA1C 6.0 03/19/2023      Essential hypertension Assessment & Plan: Continue losartan. Off amlodipine. On torsemide. Blood pressure as outlined. .  Follow  pressures.  Follow metabolic panel. Metabolic panel just checked GFR 88. Potassium wnl. Follow. Continue current medications.    History of breast cancer Assessment & Plan: Mammogram 01/03/23 - Birads I.    Hypercholesteremia Assessment & Plan: On simvastatin.  Low cholesterol diet and exercise.  Follow lipid panel and liver function tests.  No change today.  Lab Results  Component Value Date   CHOL 155 03/19/2023   HDL 89.70 03/19/2023   LDLCALC 53 03/19/2023   TRIG 62.0 03/19/2023   CHOLHDL 2 03/19/2023      Localized swelling of both lower extremities Assessment & Plan: Being followed by [facility]. Legs wrapped. Arrange f/u appt    Thyroid nodule Assessment & Plan: Noticed during recent hospitalization ( scan reviewed). Needs f/u thyroid ultrasound.    Type 2 diabetes mellitus with neurological complications (HCC) Assessment & Plan: Low carb diet and exercise as tolerated.  Follow met b and a1c. No changes today.  Lab Results  Component Value Date   HGBA1C 6.0 03/19/2023      Weakness Assessment & Plan: Discussed PT. Signed for home health to begin PT - for strengthening.       Dellar Fenton, MD

## 2023-06-13 NOTE — Telephone Encounter (Signed)
 Please see attached notes. It appears this has already been done. If not, please call and I am ok with OT

## 2023-06-13 NOTE — Telephone Encounter (Signed)
 Copied from CRM [id number]. Topic: Clinical - Home Health Verbal Orders >> Jun 13, 2023  3:20 PM Fredrich Romans wrote: Caller/Agency: [facility] Callback Number: [phone] Service Requested: Occupational Therapy Frequency: 1WK6 Any new concerns about the patient? No

## 2023-06-13 NOTE — Telephone Encounter (Signed)
 Left message to call the office back please send call to [place] or [place].

## 2023-06-13 NOTE — Telephone Encounter (Signed)
 Aggie Cosier from [employer] called back and I gave verbal orders for PT/OT evaluation and Nursing 2 x a week this week then the other 8 weeks will be 1 x a week.

## 2023-06-15 DIAGNOSIS — I110 Hypertensive heart disease with heart failure: Secondary | ICD-10-CM | POA: Diagnosis not present

## 2023-06-15 DIAGNOSIS — I4891 Unspecified atrial fibrillation: Secondary | ICD-10-CM | POA: Diagnosis not present

## 2023-06-15 DIAGNOSIS — E871 Hypo-osmolality and hyponatremia: Secondary | ICD-10-CM | POA: Diagnosis not present

## 2023-06-15 DIAGNOSIS — I10 Essential (primary) hypertension: Secondary | ICD-10-CM | POA: Diagnosis not present

## 2023-06-15 DIAGNOSIS — E119 Type 2 diabetes mellitus without complications: Secondary | ICD-10-CM | POA: Diagnosis not present

## 2023-06-15 DIAGNOSIS — I5033 Acute on chronic diastolic (congestive) heart failure: Secondary | ICD-10-CM | POA: Diagnosis not present

## 2023-06-15 NOTE — Telephone Encounter (Signed)
 Copied from CRM [id number]. Topic: Clinical - Home Health Verbal Orders >> Jun 15, 2023  9:05 AM Saverio Danker wrote: Caller/Agency: [facility] Callback Number: [phone] Service Requested: Occupational Therapy, Physical Therapy, and Skilled Nursing Frequency: Nursing once a week PT and OT evaluation Any new concerns about the patient? No

## 2023-06-15 NOTE — Telephone Encounter (Signed)
 Verbal orders given

## 2023-06-15 NOTE — Telephone Encounter (Signed)
 Copied from CRM [id number]. Topic: Clinical - Home Health Verbal Orders >> Jun 15, 2023 10:57 AM Truddie Crumble wrote: Caller/Agency: [facility]ie[facility] Callback Number: [phone] Service Requested: Occupational Therapy Frequency: two times a week for six weeks Any new concerns about the patient? No

## 2023-06-15 NOTE — Telephone Encounter (Signed)
 Ok

## 2023-06-15 NOTE — Telephone Encounter (Signed)
 LMTCB

## 2023-06-17 ENCOUNTER — Encounter: Payer: Self-pay | Admitting: Internal Medicine

## 2023-06-17 NOTE — Assessment & Plan Note (Signed)
 Mammogram 01/03/23 - Birads I.

## 2023-06-17 NOTE — Assessment & Plan Note (Signed)
 Discussed PT. Signed for home health to begin PT - for strengthening.

## 2023-06-17 NOTE — Assessment & Plan Note (Signed)
 On simvastatin.  Low cholesterol diet and exercise.  Follow lipid panel and liver function tests.  No change today.  Lab Results  Component Value Date   CHOL 155 03/19/2023   HDL 89.70 03/19/2023   LDLCALC 53 03/19/2023   TRIG 62.0 03/19/2023   CHOLHDL 2 03/19/2023

## 2023-06-17 NOTE — Assessment & Plan Note (Signed)
 Noticed during recent hospitalization ( scan reviewed). Needs f/u thyroid ultrasound.

## 2023-06-17 NOTE — Assessment & Plan Note (Signed)
 Found on previous CT head.  Mild.

## 2023-06-17 NOTE — Assessment & Plan Note (Signed)
 Being followed by [facility]. Legs wrapped. Arrange f/u appt

## 2023-06-17 NOTE — Assessment & Plan Note (Addendum)
 Follow cbc and ferritin. Recent check hgb 16.1,

## 2023-06-17 NOTE — Assessment & Plan Note (Signed)
 Continue losartan. Off amlodipine. On torsemide. Blood pressure as outlined. .  Follow pressures.  Follow metabolic panel. Metabolic panel just checked GFR 88. Potassium wnl. Follow. Continue current medications.

## 2023-06-17 NOTE — Assessment & Plan Note (Signed)
 Sugars have been well controlled.  Continue low carb diet and exercise.  PT to work with her - to strengthen and increased activity. Follow met b and A1c.  Lab Results  Component Value Date   HGBA1C 6.0 03/19/2023

## 2023-06-17 NOTE — Assessment & Plan Note (Signed)
 Seeing [facility]. Legs wrapped. Arrange earlier f/u. Swelling overall improved.

## 2023-06-17 NOTE — Assessment & Plan Note (Signed)
 Low carb diet and exercise as tolerated.  Follow met b and a1c. No changes today.  Lab Results  Component Value Date   HGBA1C 6.0 03/19/2023

## 2023-06-19 ENCOUNTER — Telehealth: Payer: Self-pay

## 2023-06-19 DIAGNOSIS — I10 Essential (primary) hypertension: Secondary | ICD-10-CM | POA: Diagnosis not present

## 2023-06-19 DIAGNOSIS — E871 Hypo-osmolality and hyponatremia: Secondary | ICD-10-CM | POA: Diagnosis not present

## 2023-06-19 DIAGNOSIS — I5033 Acute on chronic diastolic (congestive) heart failure: Secondary | ICD-10-CM | POA: Diagnosis not present

## 2023-06-19 DIAGNOSIS — I4891 Unspecified atrial fibrillation: Secondary | ICD-10-CM | POA: Diagnosis not present

## 2023-06-19 DIAGNOSIS — I110 Hypertensive heart disease with heart failure: Secondary | ICD-10-CM | POA: Diagnosis not present

## 2023-06-19 DIAGNOSIS — E119 Type 2 diabetes mellitus without complications: Secondary | ICD-10-CM | POA: Diagnosis not present

## 2023-06-19 NOTE — Telephone Encounter (Signed)
 Copied from CRM [id number]. Topic: Clinical - Home Health Verbal Orders >> Jun 19, 2023  4:40 PM DeAngela L wrote: Caller/Agency: Carmelina Chinchilla with [facility]  Callback Number: [phone] Service Requested: Physical Therapy verbal order Frequency: 1w2 2w2 1w5  Any new concerns about the patient? No

## 2023-06-20 ENCOUNTER — Telehealth: Payer: Self-pay | Admitting: Internal Medicine

## 2023-06-20 DIAGNOSIS — I4891 Unspecified atrial fibrillation: Secondary | ICD-10-CM | POA: Diagnosis not present

## 2023-06-20 DIAGNOSIS — I10 Essential (primary) hypertension: Secondary | ICD-10-CM | POA: Diagnosis not present

## 2023-06-20 DIAGNOSIS — E119 Type 2 diabetes mellitus without complications: Secondary | ICD-10-CM | POA: Diagnosis not present

## 2023-06-20 DIAGNOSIS — I5033 Acute on chronic diastolic (congestive) heart failure: Secondary | ICD-10-CM | POA: Diagnosis not present

## 2023-06-20 DIAGNOSIS — E871 Hypo-osmolality and hyponatremia: Secondary | ICD-10-CM | POA: Diagnosis not present

## 2023-06-20 DIAGNOSIS — I110 Hypertensive heart disease with heart failure: Secondary | ICD-10-CM | POA: Diagnosis not present

## 2023-06-20 NOTE — Telephone Encounter (Signed)
LM for Jenny Reichmann to return my call.

## 2023-06-20 NOTE — Telephone Encounter (Signed)
 Called and spoke to Ms [place] and Laveta Pottier to let them know that I did call [facility] to let them know about her swelling and the wraps. (Called them last week). They are supposed to be calling with an appt. She did get her legs rewrapped today. Reports swelling doing ok.  I also notified them that the CT scan she had in the hospital revealed am incidental finding of thyroid nodule and that she needed an thyroid ultrasound. She wants to hold on scheduling right now, but will let me know when ready.

## 2023-06-21 DIAGNOSIS — E871 Hypo-osmolality and hyponatremia: Secondary | ICD-10-CM | POA: Diagnosis not present

## 2023-06-21 DIAGNOSIS — I4891 Unspecified atrial fibrillation: Secondary | ICD-10-CM | POA: Diagnosis not present

## 2023-06-21 DIAGNOSIS — I10 Essential (primary) hypertension: Secondary | ICD-10-CM | POA: Diagnosis not present

## 2023-06-21 DIAGNOSIS — I5033 Acute on chronic diastolic (congestive) heart failure: Secondary | ICD-10-CM | POA: Diagnosis not present

## 2023-06-21 DIAGNOSIS — I110 Hypertensive heart disease with heart failure: Secondary | ICD-10-CM | POA: Diagnosis not present

## 2023-06-21 DIAGNOSIS — E119 Type 2 diabetes mellitus without complications: Secondary | ICD-10-CM | POA: Diagnosis not present

## 2023-06-21 NOTE — Telephone Encounter (Signed)
LM for Cindy.

## 2023-06-22 ENCOUNTER — Other Ambulatory Visit: Payer: Self-pay

## 2023-06-22 ENCOUNTER — Ambulatory Visit: Payer: Self-pay

## 2023-06-22 NOTE — Telephone Encounter (Signed)
   Chief Complaint:difficulty breathing Symptoms:some SOB only w/exertion O2@93 , wheezing,    cough                                             Frequency: x 1 week Pertinent Negatives: Patient denies fever Disposition: [] ED /[] Urgent Care (no appt availability in office) / [x] Appointment(In office/virtual)/ []  [facility]/ [] Home Care/ [] Refused Recommended Disposition /[] [facility]/ []  Follow-up with PCP Additional Notes: at rest no SOB, pt not able to use inhaler may need nebulizer. Pt has a cough and is wheezing is having some SOB with exertion. Family has been trying to help education pt on how to use inhaler but patient does not understand how to make it work.  Wheezing started around Tuesday.  Will sent PCP office request for patient to be worked in sooner.  Spoke with Durel Gilbert- caregiver Reason for Disposition  [1] MODERATE longstanding difficulty breathing (e.g., speaks in phrases, SOB even at rest, pulse 100-120) AND [2] SAME as normal  Answer Assessment - Initial Assessment Questions 1. RESPIRATORY STATUS: "Describe your breathing?" (e.g., wheezing, shortness of breath, unable to speak, severe coughing)      Wheezing, SOB on exertion, cough 2. ONSET: "When did this breathing problem begin?"      X 1 week 3. PATTERN "Does the difficult breathing come and go, or has it been constant since it started?"      constant 4. SEVERITY: "How bad is your breathing?" (e.g., mild, moderate, severe)    - MILD: No SOB at rest, mild SOB with walking, speaks normally in sentences, can lie down, no retractions, pulse < 100.    - MODERATE: SOB at rest, SOB with minimal exertion and prefers to sit, cannot lie down flat, speaks in phrases, mild retractions, audible wheezing, pulse 100-120.    - SEVERE: Very SOB at rest, speaks in single words, struggling to breathe, sitting hunched forward, retractions, pulse > 120      Mild to moderate 5. RECURRENT SYMPTOM: "Have you had  difficulty breathing before?" If Yes, ask: "When was the last time?" and "What happened that time?"      no 6. CARDIAC HISTORY: "Do you have any history of heart disease?" (e.g., heart attack, angina, bypass surgery, angioplasty)      N/a 7. LUNG HISTORY: "Do you have any history of lung disease?"  (e.g., pulmonary embolus, asthma, emphysema)     N/a 8. CAUSE: "What do you think is causing the breathing problem?"      N/a 9. OTHER SYMPTOMS: "Do you have any other symptoms? (e.g., dizziness, runny nose, cough, chest pain, fever)     N/a 10. O2 SATURATION MONITOR:  "Do you use an oxygen saturation monitor (pulse oximeter) at home?" If Yes, ask: "What is your reading (oxygen level) today?" "What is your usual oxygen saturation reading?" (e.g., 95%)       93 11. PREGNANCY: "Is there any chance you are pregnant?" "When was your last menstrual period?"       N/a 12. TRAVEL: "Have you traveled out of the country in the last month?" (e.g., travel history, exposures)       N/a  Protocols used: Breathing Difficulty-A-AH

## 2023-06-22 NOTE — Telephone Encounter (Signed)
 Pt's daughter called in about the pt. Daughter is not on the pt's DPR. RN asked the daughter if the daughter could get verbal permission from the pt so that this RN could speak with the daughter about the pt's PHI and medications. Daughter stated she is upstairs currently and is in the middle of getting dressed and stated she will call us  back momentarily once she is dressed.  Copied from CRM [id number]. Topic: Clinical - Red Word Triage >> Jun 22, 2023  9:54 AM Annelle Kiel wrote: Red Word that prompted transfer to Nurse Triage: patient is having wheezing her emergency inhaler is not helping her

## 2023-06-22 NOTE — Patient Instructions (Signed)
 Visit Information  Thank you for taking time to visit with me today. Please don't hesitate to contact me if I can be of assistance to you before our next scheduled appointment.  Our next appointment is by telephone on 07/10/23 at 9:15 am Please call the care guide team at [phone] if you need to cancel or reschedule your appointment.   Following is a copy of your care plan:   Goals Addressed             This Visit's Progress    VBCI RN Care Plan- HF/ bilateral LE       Problems:  Chronic Disease Management support and education needs related to CHF and bilateral LE Cognitive Deficits Caregiver and daughter report patient having mild memory changes in regards to remembering having   Goal: Over the next 3 months the Caregiver Patient will attend all scheduled medical appointments: with provider  as evidenced by chart review/ patient report        continue to work with RN Care Manager and/or Social Worker to address care management and care coordination needs related to CHF as evidenced by adherence to care management team scheduled appointments     demonstrate Ongoing adherence to prescribed treatment plan for COPD as evidenced by patient report/ chart review.  take all medications exactly as prescribed and will call provider for medication related questions as evidenced by patient report/ chart review.     verbalize basic understanding of CHF disease process and self health management plan as evidenced by chart review/ patient report. verbalize understanding of plan for management of CHF as evidenced by chart review/ patient report Patient will continue to work with home health PT/ OT and with SN for leg wrapping    Interventions:   Heart Failure Interventions: Provided education on low sodium diet Reviewed Heart Failure Action Plan in depth and provided written copy Assessed need for readable accurate scales in home Advised patient to weigh each morning after emptying  bladder Discussed importance of daily weight and advised patient to weigh and record daily Discussed the importance of keeping all appointments with provider Reviewed heart failure symptoms. Advised to call provider office today to report ongoing SOB/ wheezing symptoms unrelieved by treatment/ inhaler use. Advised to notify provider for increase in HF symptoms or signs/ symptoms of infection in LE.  Advised to elevate legs with sitting or laying down.  Advised to call 911 for severe breathing symptoms.   Patient Self-Care Activities:  Attend all scheduled provider appointments Call pharmacy for medication refills 3-7 days in advance of running out of medications Call provider office for new concerns or questions  Take medications as prescribed   call office if I gain more than 2 pounds in one day or 5 pounds in one week keep legs up while sitting track weight in diary use salt in moderation watch for swelling in feet, ankles and legs every day weigh myself daily bring diary to all appointments Elevate legs when sitting or lying down Call primary provider today to report ongoing shortness of breath /wheezing symptoms that have been unrelieved by treatment/ inhaler use.  Plan:  Telephone follow up appointment with care management team member scheduled for:  07/10/23 at 9:15 am             Please call the Suicide and Crisis Lifeline: 988 call 1-800-273-TALK (toll free, 24 hour hotline) if you are experiencing a Mental Health or Behavioral Health Crisis or need someone to talk to.  Patient verbalizes understanding of instructions and care plan provided today and agrees to view in MyChart. Active MyChart status and patient understanding of how to access instructions and care plan via MyChart confirmed with patient.     Dalonte Hardage RN, BSN, CCM [place]  [facility], Population Health Case Manager Phone: [phone]  Heart Failure Action Plan A heart failure  action plan helps you know what to do when you have symptoms of heart failure. Your action plan is a color-coded plan that lists the symptoms to watch for and indicates what actions to take. If you have symptoms in the Haivyn Oravec zone, you're doing well. If you have symptoms in the yellow zone, you're having problems. If you have symptoms in the red zone, you need medical care right away. Follow the plan that was created by you and your health care provider. Review your plan each time you visit your provider. Carlyon Nolasco zone These signs mean you're doing well and can continue what you're doing: You don't have new or worsening shortness of breath. You have very little swelling or no new swelling. Your weight is stable (no gain or loss). You have a normal activity level. You don't have chest pain or any other new symptoms. Yellow zone These signs and symptoms mean your condition may be getting worse and you should make some changes: You have trouble breathing when you're active. You have swelling in your feet or legs or have discomfort in your belly. You gain 2-3 lb (0.9-1.4 kg) in 24 hours, or 5 lb (2.3 kg) in a week. This amount may be more or less depending on your condition. You get tired easily. You have trouble sleeping. You have a dry cough. If you have any of these symptoms: Contact your provider within the next day. Your provider may adjust your medicines. Red zone These signs and symptoms mean you should get medical help right away: You have trouble breathing when resting or cannot lie flat and you need to raise your head to help you breathe. You have a dry cough that's getting worse. You have swelling or pain in your feet or legs or discomfort in your belly that's getting worse. You suddenly gain more than 2-3 lb (0.9-1.4 kg) in 24 hours, or more than 5 lb (2.3 kg) in a week. This amount may be more or less depending on your condition. You have trouble staying awake or you feel  confused. You don't have an appetite. You have worsening sadness or depression. These symptoms may be an emergency. Call 911 right away. Do not wait to see if the symptoms will go away. Do not drive yourself to the hospital. Follow these instructions at home: Take medicines only as told. Eat a heart-healthy diet. Work with a dietitian to create an eating plan that's best for you. Weigh yourself each day. Your target weight is __________ lb (__________ kg). Call your provider if you gain more than __________ lb (__________ kg) in 24 hours, or more than __________ lb (__________ kg) in a week. Health care provider name: _____________________________________________________ Health care provider phone number: _____________________________________________________ Where to find more information American Heart Association: heart.org This information is not intended to replace advice given to you by your health care provider. Make sure you discuss any questions you have with your health care provider. Document Revised: 10/05/2022 Document Reviewed: 10/05/2022 Elsevier Patient Education  2024 [employer].  Fall Prevention in the Home, Adult Falls can cause injuries and can happen to people  of all ages. There are many things you can do to make your home safer and to help prevent falls. What actions can I take to prevent falls? General information Use good lighting in all rooms. Make sure to: Replace any light bulbs that burn out. Turn on the lights in dark areas and use night-lights. Keep items that you use often in easy-to-reach places. Lower the shelves around your home if needed. Move furniture so that there are clear paths around it. Do not use throw rugs or other things on the floor that can make you trip. If any of your floors are uneven, fix them. Add color or contrast paint or tape to clearly mark and help you see: Grab bars or handrails. First and last steps of staircases. Where the  edge of each step is. If you use a ladder or stepladder: Make sure that it is fully opened. Do not climb a closed ladder. Make sure the sides of the ladder are locked in place. Have someone hold the ladder while you use it. Know where your pets are as you move through your home. What can I do in the bathroom?     Keep the floor dry. Clean up any water on the floor right away. Remove soap buildup in the bathtub or shower. Buildup makes bathtubs and showers slippery. Use non-skid mats or decals on the floor of the bathtub or shower. Attach bath mats securely with double-sided, non-slip rug tape. If you need to sit down in the shower, use a non-slip stool. Install grab bars by the toilet and in the bathtub and shower. Do not use towel bars as grab bars. What can I do in the bedroom? Make sure that you have a light by your bed that is easy to reach. Do not use any sheets or blankets on your bed that hang to the floor. Have a firm chair or bench with side arms that you can use for support when you get dressed. What can I do in the kitchen? Clean up any spills right away. If you need to reach something above you, use a step stool with a grab bar. Keep electrical cords out of the way. Do not use floor polish or wax that makes floors slippery. What can I do with my stairs? Do not leave anything on the stairs. Make sure that you have a light switch at the top and the bottom of the stairs. Make sure that there are handrails on both sides of the stairs. Fix handrails that are broken or loose. Install non-slip stair treads on all your stairs if they do not have carpet. Avoid having throw rugs at the top or bottom of the stairs. Choose a carpet that does not hide the edge of the steps on the stairs. Make sure that the carpet is firmly attached to the stairs. Fix carpet that is loose or worn. What can I do on the outside of my home? Use bright outdoor lighting. Fix the edges of walkways and  driveways and fix any cracks. Clear paths of anything that can make you trip, such as tools or rocks. Add color or contrast paint or tape to clearly mark and help you see anything that might make you trip as you walk through a door, such as a raised step or threshold. Trim any bushes or trees on paths to your home. Check to see if handrails are loose or broken and that both sides of all steps have handrails. Install guardrails along  the edges of any raised decks and porches. Have leaves, snow, or ice cleared regularly. Use sand, salt, or ice melter on paths if you live where there is ice and snow during the winter. Clean up any spills in your garage right away. This includes grease or oil spills. What other actions can I take? Review your medicines with your doctor. Some medicines can cause dizziness or changes in blood pressure, which increase your risk of falling. Wear shoes that: Have a low heel. Do not wear high heels. Have rubber bottoms and are closed at the toe. Feel good on your feet and fit well. Use tools that help you move around if needed. These include: Canes. Walkers. Scooters. Crutches. Ask your doctor what else you can do to help prevent falls. This may include seeing a physical therapist to learn to do exercises to move better and get stronger. Where to find more information Centers for Disease Control and Prevention, STEAD[employer]: TonerPromos.no [employer] on Aging: BaseRingTones.pl National Institute on Aging: BaseRingTones.pl Contact a doctor if: You are afraid of falling at home. You feel weak, drowsy, or dizzy at home. You fall at home. Get help right away if you: Lose consciousness or have trouble moving after a fall. Have a fall that causes a head injury. These symptoms may be an emergency. Get help right away. Call 911. Do not wait to see if the symptoms will go away. Do not drive yourself to the hospital. This information is not intended to replace advice given to you by  your health care provider. Make sure you discuss any questions you have with your health care provider. Document Revised: 10/24/2021 Document Reviewed: 10/24/2021 Elsevier Patient Education  2024 [employer].

## 2023-06-22 NOTE — Patient Outreach (Signed)
 Complex Care Management   Visit Note  06/22/2023  Name:  Anita Walker MRN: [id number] DOB: 07/12/1940  Situation: Referral received for Complex Care Management related to Heart Failure I obtained verbal consent from Caregiver Patient.  Visit completed with patient, caregiver Avelina Sharps and daughter Muskan Bolla.  Patient gave verbal authorization to speak with daughter and caregiver about personal health information  on the phone  Background:   Past Medical History:  Diagnosis Date   Anemia    Arthritis    Atrial fibrillation (HCC)    Breast cancer (HCC)    s/p lumpectomy 1992.  s/p chemo and xrt left breast   Diabetes mellitus (HCC)    Edema    feet/legs   Gastric ulcer    GERD (gastroesophageal reflux disease)    HOH (hard of hearing)    aides   Hypercholesterolemia    Hypertension    Personal history of chemotherapy    Pulmonary emboli (HCC)     Assessment: Patient Reported Symptoms:  Cognitive Cognitive Status: Alert and oriented to person, place, and time, Struggling with memory recall, Normal speech and language skills Cognitive/Intellectual Conditions Management [RPT]: None reported or documented in medical history or problem list   Health Maintenance Behaviors: Annual physical exam, Immunizations Healing Pattern: Average  Neurological Neurological Review of Symptoms: Hearing changes Neurological Conditions:  (none per caregiver/ daughter) Neurological Management Strategies: Medical device, Routine screening (hearing aids) Neurological Self-Management Outcome: 3 (uncertain) Neurological Comment: Caregiver states patient has bilateral hearing aids however still struggles with hearing.  Today's assessment completed with caregiver and daughter due to patient being hard of hearing.  HEENT HEENT Symptoms Reported: No symptoms reported      Cardiovascular Cardiovascular Symptoms Reported: Swelling in legs or feet Does patient have uncontrolled Hypertension?:  No Cardiovascular Conditions: Heart failure, Hypertension Cardiovascular Management Strategies: Diet modification, Medication therapy, Routine screening, Medical device Weight: 224 lb (101.6 kg) Cardiovascular Self-Management Outcome: 3 (uncertain) Cardiovascular Comment: Patient requires legs to be wrapped due to swelling. Receiving home heatlh services with [facility] and SN is doing leg wraps per daughter/ caregiver. Uses rollator for ambulation  Respiratory Respiratory Symptoms Reported: Shortness of breath Respiratory Conditions: Shortness of breath Respiratory Self-Management Outcome: 3 (uncertain)  Endocrine Patient reports the following symptoms related to hypoglycemia or hyperglycemia : No symptoms reported Is patient diabetic?: Yes Is patient checking blood sugars at home?: Yes Endocrine Conditions: Diabetes Endocrine Management Strategies: Routine screening Endocrine Self-Management Outcome: 4 (good) Endocrine Comment: Caregiver states patient is not on any medication for blood sugars and is only managed with diet at this time.  Gastrointestinal Gastrointestinal Symptoms Reported: Constipation Gastrointestinal Conditions: Constipation Gastrointestinal Management Strategies: Medication therapy Gastrointestinal Self-Management Outcome: 4 (good) Nutrition Risk Screen (CP): No indicators present  Genitourinary Genitourinary Symptoms Reported: Incontinence Genitourinary Conditions: Incontinence Genitourinary Management Strategies: Incontinence garment/pad Genitourinary Self-Management Outcome: 4 (good)  Integumentary Integumentary Symptoms Reported: No symptoms reported    Musculoskeletal Musculoskelatal Symptoms Reviewed: Difficulty walking, Unsteady gait Musculoskeletal Conditions: Mobility limited, Frailty syndrome, Unsteady gait, Back pain Musculoskeletal Management Strategies: Routine screening, Medication therapy Musculoskeletal Self-Management Outcome: 3  (uncertain) Musculoskeletal Comment: Caregiver/ daughter state they feel patients mobility issues are caused by several factors: back pain, bilateral leg edema, overall weakness Falls in the past year?: Yes Number of falls in past year: 2 or more Was there an injury with Fall?: No Fall Risk Category Calculator: 2 Patient Fall Risk Level: Moderate Fall Risk Patient at Risk for Falls Due to: Impaired balance/gait, Impaired mobility, History  of fall(s) Fall risk Follow up: Education provided, Falls prevention discussed  Psychosocial Psychosocial Symptoms Reported: Anxiety - if selected complete GAD, Depression - if selected complete PHQ 2-9 Behavioral Health Conditions: Anxiety, Depression (daughter/ caregiver report) Behavioral Management Strategies: Medication therapy, Support system Behavioral Health Self-Management Outcome: 4 (good) Major Change/Loss/Stressor/Fears (CP): Medical condition, self Techniques to Cope with Loss/Stress/Change: Diversional activities, Medication Quality of Family Relationships: unable to assess (assessement completed with family member and caregiver.) Do you feel physically threatened by others?:  (unable to assess)      06/22/2023   10:47 AM  Depression screen PHQ 2/9  Decreased Interest 1  Down, Depressed, Hopeless 0  PHQ - 2 Score 1    Vitals:   06/22/23 0926  BP: 128/64    Medications Reviewed Today     Reviewed by Lamichael Youkhana E, RN (Registered Nurse) on 06/22/23 at [phone]  Med List Status: <None>   Medication Order Taking? Sig Documenting Provider Last Dose Status Informant  acetaminophen  (TYLENOL ) 325 MG tablet [id number] Yes Take by mouth. [provider] Taking Active   albuterol  (VENTOLIN  HFA) 108 (90 Base) MCG/ACT inhaler [id number] Yes Inhale 1-2 puffs into the lungs every 6 (six) hours as needed for wheezing. Glendia Shad, MD Taking Active Child  alum & mag hydroxide-simeth (MAALOX/MYLANTA) 200-200-20 MG/5ML suspension [id number]  Yes Take 15 mLs by mouth every 6 (six) hours as needed for indigestion or heartburn. Darci Pore, MD Taking Active   aspirin  EC 81 MG tablet [id number] Yes Take 1 tablet (81 mg total) by mouth daily. Swallow whole. Darci Pore, MD Taking Active   diphenhydrAMINE  (BENADRYL ) 25 mg capsule [id number] Yes Take 25 mg at bedtime as needed by mouth for sleep.  [provider] Taking Active Child  escitalopram  (LEXAPRO ) 10 MG tablet [id number] Yes Take 10 mg by mouth daily. [provider] Taking Active   feeding supplement (ENSURE ENLIVE / ENSURE PLUS) LIQD [id number] Yes Take 237 mLs by mouth 2 (two) times daily between meals. Tobie Calix, MD Taking Active Child  ferrous sulfate  (FEROSUL) 325 (65 FE) MG tablet [id number] Yes Take 1 tablet (325 mg total) by mouth every other day. One tablet qod Glendia Shad, MD Taking Active Child  fluticasone  (FLONASE ) 50 MCG/ACT nasal spray [id number] Yes Place 2 sprays into both nostrils daily. [provider] Taking Active   glucose blood (CONTOUR TEST) test strip [id number]  USE TO TEST BLOOD SUGAR ONCE DAILY Glendia Shad, MD  Active Child  losartan  (COZAAR ) 50 MG tablet [id number] Yes Take 50 mg by mouth daily. [provider] Taking Active   metoprolol tartrate (LOPRESSOR) 25 MG tablet [id number] Yes Take 25 mg by mouth daily. [provider] Taking Active   Multiple Vitamin (MULTIVITAMIN WITH MINERALS) TABS tablet [id number] Yes Take 1 tablet daily by mouth. [provider] Taking Active Child  pantoprazole  (PROTONIX ) 40 MG tablet [id number] Yes TAKE 1 TABLET(40 MG) BY MOUTH DAILY Glendia Shad, MD Taking Active Child  torsemide (DEMADEX) 20 MG tablet [id number] Yes Take 20 mg by mouth daily. [provider] Taking Active             Recommendation:   Acute PCP follow-up due to ongoing SOB/ wheezing unrelieved by treatment/ inhaler usage. Advised to call primary care provider today  to report. Advised 911 for severe symptoms. .  Follow Up Plan:   Telephone follow-up in 1 month with RN care manager  Arvin Seip RN, BSN, CCM [place]  [facility], [facility]  Case Manager Phone: [phone]

## 2023-06-25 ENCOUNTER — Ambulatory Visit (INDEPENDENT_AMBULATORY_CARE_PROVIDER_SITE_OTHER): Admitting: Internal Medicine

## 2023-06-25 ENCOUNTER — Telehealth: Payer: Self-pay

## 2023-06-25 ENCOUNTER — Encounter: Payer: Self-pay | Admitting: Internal Medicine

## 2023-06-25 VITALS — BP 116/72 | Temp 97.6°F | Ht 67.0 in | Wt 230.8 lb

## 2023-06-25 DIAGNOSIS — R0602 Shortness of breath: Secondary | ICD-10-CM

## 2023-06-25 DIAGNOSIS — E119 Type 2 diabetes mellitus without complications: Secondary | ICD-10-CM | POA: Diagnosis not present

## 2023-06-25 DIAGNOSIS — I10 Essential (primary) hypertension: Secondary | ICD-10-CM | POA: Diagnosis not present

## 2023-06-25 DIAGNOSIS — J439 Emphysema, unspecified: Secondary | ICD-10-CM | POA: Diagnosis not present

## 2023-06-25 DIAGNOSIS — I110 Hypertensive heart disease with heart failure: Secondary | ICD-10-CM | POA: Diagnosis not present

## 2023-06-25 DIAGNOSIS — R110 Nausea: Secondary | ICD-10-CM | POA: Diagnosis not present

## 2023-06-25 DIAGNOSIS — E871 Hypo-osmolality and hyponatremia: Secondary | ICD-10-CM | POA: Diagnosis not present

## 2023-06-25 DIAGNOSIS — I5033 Acute on chronic diastolic (congestive) heart failure: Secondary | ICD-10-CM | POA: Diagnosis not present

## 2023-06-25 DIAGNOSIS — I4891 Unspecified atrial fibrillation: Secondary | ICD-10-CM | POA: Diagnosis not present

## 2023-06-25 MED ORDER — PREDNISONE 20 MG PO TABS
40.0000 mg | ORAL_TABLET | Freq: Every day | ORAL | 0 refills | Status: DC
Start: 2023-06-25 — End: 2023-07-20

## 2023-06-25 MED ORDER — ALBUTEROL SULFATE (2.5 MG/3ML) 0.083% IN NEBU: 2.5000 mg | INHALATION_SOLUTION | Freq: Four times a day (QID) | RESPIRATORY_TRACT | 1 refills | Status: DC | PRN

## 2023-06-25 MED ORDER — ONDANSETRON 4 MG PO TBDP: 4.0000 mg | ORAL_TABLET | Freq: Three times a day (TID) | ORAL | 0 refills | Status: AC | PRN

## 2023-06-25 MED ORDER — AZITHROMYCIN 500 MG PO TABS: 500.0000 mg | ORAL_TABLET | Freq: Every day | ORAL | 0 refills | Status: DC

## 2023-06-25 MED ORDER — PREDNISONE 20 MG PO TABS: 40.0000 mg | ORAL_TABLET | Freq: Every day | ORAL | 0 refills | Status: DC

## 2023-06-25 NOTE — Telephone Encounter (Signed)
 Appt moved up to today with Narendra

## 2023-06-25 NOTE — Assessment & Plan Note (Signed)
-   Patient has been noted to be short of breath over the last week by her aide and the family -Etiology behind her shortness of breath her main concern at this time and possibly secondary to an underlying COPD exacerbation -Lung exam is clear to auscultation today with no Rales or wheezes noted -However, given her episodes of hypoxia and wheezing we will need to rule out an underlying pneumonia or heart failure exacerbation as a cause of her symptoms -Will obtain chest x-ray for further evaluation

## 2023-06-25 NOTE — Telephone Encounter (Signed)
 Need more information. Is she having problems breathing now?  Lungs were clear and oxygen ok in office.

## 2023-06-25 NOTE — Telephone Encounter (Signed)
 Copied from CRM [id number]. Topic: Clinical - Medical Advice >> Jun 25, 2023  4:49 PM Star East wrote: Reason for CRM: daughter [place] is asking what the oxygen level should be at and when they should call 911.  Also wants to know when they should be concerned about weight gain.Please call daughter Gerety  at [phone]

## 2023-06-25 NOTE — Assessment & Plan Note (Signed)
-   Patient complains of nausea today but denies any episodes of vomiting -Abdominal exam is benign -Uncertain etiology of her nausea but will prescribe Zofran  as needed to see if this will help alleviate her symptoms -No further workup at this time

## 2023-06-25 NOTE — Progress Notes (Signed)
 Acute Office Visit  Subjective:     [name], female    DOB: 1940/07/23, 83 y.o.   MRN: [id number]  Chief Complaint  Patient presents with   Acute Visit    Wheezing x 1 week    HPI Patient is in today for shortness of breath and wheezing over the last week.  Patient states that her her breathing is "not good" but is unable to clarify further.  Patient's aide states that she has noted the patient to have intermittent episodes of wheezing and shortness of breath and that this morning her O2 sats were in the 80s when OT came to work with her and that she was wheezing as well.  Aide states that the patient's family has also noted intermittent episodes of wheezing.  She does have albuterol  inhaler at home but is unable to use this correctly.  Patient does complain of nausea while here but denies any other complaints currently.  She does have bilateral lower extremity edema which is chronic and is at baseline per aide.  Of note, patient was diagnosed with emphysema in 2019 by pulmonology but was not recommended treatment at that time since she was asymptomatic.  Per Dr. Thayne Fine note a couple of weeks ago patient did have spirometry done this year with Dr. Jamal Mays who suspected COPD.  They recommend albuterol  as well as Trelegy.  Patient's O2 sats here was 90% on room air and she denies any wheezing currently.  Review of Systems  Constitutional: Negative.   Respiratory:  Positive for cough, shortness of breath and wheezing.   Cardiovascular:  Positive for leg swelling. Negative for chest pain.       Bilateral lower extremity swelling at baseline  Gastrointestinal:  Positive for nausea. Negative for vomiting.  Neurological: Negative.   Psychiatric/Behavioral: Negative.          Objective:    BP 116/72   Temp 97.6 F (36.4 C)   Ht 5\' 7"  (1.702 m)   Wt 230 lb 12.8 oz (104.7 kg)   SpO2 90%   BMI 36.15 kg/m    Physical Exam Constitutional:      Appearance: Normal  appearance.  HENT:     Head: Normocephalic and atraumatic.  Cardiovascular:     Rate and Rhythm: Normal rate and regular rhythm.     Heart sounds: Normal heart sounds.  Pulmonary:     Breath sounds: Normal breath sounds. No wheezing or rales.  Abdominal:     General: Bowel sounds are normal. There is no distension.     Palpations: Abdomen is soft.     Tenderness: There is no abdominal tenderness. There is no guarding or rebound.  Musculoskeletal:        General: Swelling present. No tenderness.     Comments: Bilateral lower extremity pitting edema noted.  Lower extremities are wrapped with Ace bandages.  Neurological:     Mental Status: She is alert.  Psychiatric:        Mood and Affect: Mood normal.        Behavior: Behavior normal.     No results found for any visits on 06/25/23.      Assessment & Plan:   Problem List Items Addressed This Visit       Respiratory   Emphysema lung (HCC) - Primary   - Patient was diagnosed with emphysema back in 2019 but was not on any treatment at the time secondary to her being asymptomatic. -This year,  patient had spirometry done with Dr. Jamal Mays and was prescribed albuterol  and Trelegy -However, patient is unable to take her inhalers correctly. -Over the last week, patient's aide and family have noted intermittent episodes of wheezing and shortness of breath.  Per aide, patient was noted to have O2 sats in the 80s this morning when OT came and was noted to be audibly wheezing -O2 sats here were 90% and lung exam had no wheezes or rales -I suspect patient likely has an underlying COPD exacerbation given her history of COPD and intermittent episodes of wheezing and shortness of breath -Will treat the patient with azithromycin  and prednisone  to complete a 5-day course -Will also prescribe albuterol  nebulizer to use as needed -Will also obtain a chest x-ray to rule out an underlying pneumonia precipitating her symptoms -Return precautions  given to the patient and aide -No further workup at this time      Relevant Medications   albuterol  (PROVENTIL ) (2.5 MG/3ML) 0.083% nebulizer solution   azithromycin  (ZITHROMAX ) 500 MG tablet   predniSONE  (DELTASONE ) 20 MG tablet     Other   Nausea   - Patient complains of nausea today but denies any episodes of vomiting -Abdominal exam is benign -Uncertain etiology of her nausea but will prescribe Zofran  as needed to see if this will help alleviate her symptoms -No further workup at this time      Relevant Medications   ondansetron  (ZOFRAN -ODT) 4 MG disintegrating tablet   Shortness of breath   - Patient has been noted to be short of breath over the last week by her aide and the family -Etiology behind her shortness of breath her main concern at this time and possibly secondary to an underlying COPD exacerbation -Lung exam is clear to auscultation today with no Rales or wheezes noted -However, given her episodes of hypoxia and wheezing we will need to rule out an underlying pneumonia or heart failure exacerbation as a cause of her symptoms -Will obtain chest x-ray for further evaluation      Relevant Orders   DG Chest 2 View    Meds ordered this encounter  Medications   albuterol  (PROVENTIL ) (2.5 MG/3ML) 0.083% nebulizer solution    Sig: Take 3 mLs (2.5 mg total) by nebulization every 6 (six) hours as needed for wheezing or shortness of breath.    Dispense:  150 mL    Refill:  1   azithromycin  (ZITHROMAX ) 500 MG tablet    Sig: Take 1 tablet (500 mg total) by mouth daily.    Dispense:  5 tablet    Refill:  0   DISCONTD: predniSONE  (DELTASONE ) 20 MG tablet    Sig: Take 2 tablets (40 mg total) by mouth daily with breakfast.    Dispense:  10 tablet    Refill:  0   ondansetron  (ZOFRAN -ODT) 4 MG disintegrating tablet    Sig: Take 1 tablet (4 mg total) by mouth every 8 (eight) hours as needed for nausea or vomiting.    Dispense:  20 tablet    Refill:  0   predniSONE   (DELTASONE ) 20 MG tablet    Sig: Take 2 tablets (40 mg total) by mouth daily with breakfast.    Dispense:  10 tablet    Refill:  0    No follow-ups on file.  Demika Langenderfer, MD

## 2023-06-25 NOTE — Telephone Encounter (Signed)
 Called and spoke with Sallyanne Creamer, pt's daughter.  Sallyanne Creamer wanted to know at what point they need to be concerned about Oxygen saturation and weight gain?  Since pt's last appointment with Dr. Geralyn Knee on 06/13/23 pt has gained 7 lbs.  While on the phone with Sallyanne Creamer pt called her daughter.  Was able to speak with both Rhonda and pt, her oxygen sat is 84% now.  Talked with Dr. Geralyn Knee, if pt has had weight gain of 7 lbs and oxygen sats of 84% that she should be seen in ED.  Sallyanne Creamer reported that nebulizer treatments were called in but pt does not have nebulizer machine.  Let her know that we have a nebulizer machine in office that I can get ready for pt but would not be ready until tomorrow.  Sallyanne Creamer reported that she has a nebulizer that she can use for pt tonight.  Talked with Sallyanne Creamer about if pt's hands are cold that oxygen sats may not be accurate.  She needs to look at nail beds, lips for signs of cyanosis or blue color.  Also increased shortness of breath or trouble catching her breath would be concerning.  Instructed Rhonda to take her mother is the ED with o2 sats of 84%, weight gain of 7 lbs and increase work of breathing.

## 2023-06-25 NOTE — Patient Instructions (Signed)
-   It was a pleasure meeting you today -I am concerned with your wheezing and shortness of breath that you may be having a COPD exacerbation -We will treat this with steroids (prednisone  40 mg daily for 5 days) as well as an antibiotic (azithromycin ) -I have also prescribed albuterol  nebulizer to use as needed for wheezing -Since you are nauseous here I have prescribed Zofran  for you to see if this will help with your nausea -We will obtain a chest x-ray to rule out pneumonia -Please follow-up with us  if your symptoms persist or worsen despite treatment

## 2023-06-25 NOTE — Telephone Encounter (Signed)
 Reviewed. My understanding, daughter coming to pick up nebulizer in office 06/26/23. Please have her weigh daily. Was to have neb treatments nithgh of 06/25/23. Please confirm how breathing is doing now.

## 2023-06-25 NOTE — Assessment & Plan Note (Signed)
-   Patient was diagnosed with emphysema back in 2019 but was not on any treatment at the time secondary to her being asymptomatic. -This year, patient had spirometry done with Dr. Jamal Mays and was prescribed albuterol  and Trelegy -However, patient is unable to take her inhalers correctly. -Over the last week, patient's aide and family have noted intermittent episodes of wheezing and shortness of breath.  Per aide, patient was noted to have O2 sats in the 80s this morning when OT came and was noted to be audibly wheezing -O2 sats here were 90% and lung exam had no wheezes or rales -I suspect patient likely has an underlying COPD exacerbation given her history of COPD and intermittent episodes of wheezing and shortness of breath -Will treat the patient with azithromycin  and prednisone  to complete a 5-day course -Will also prescribe albuterol  nebulizer to use as needed -Will also obtain a chest x-ray to rule out an underlying pneumonia precipitating her symptoms -Return precautions given to the patient and aide -No further workup at this time

## 2023-06-26 ENCOUNTER — Encounter (INDEPENDENT_AMBULATORY_CARE_PROVIDER_SITE_OTHER)

## 2023-06-26 ENCOUNTER — Encounter (INDEPENDENT_AMBULATORY_CARE_PROVIDER_SITE_OTHER): Payer: Self-pay

## 2023-06-26 ENCOUNTER — Ambulatory Visit (INDEPENDENT_AMBULATORY_CARE_PROVIDER_SITE_OTHER): Admitting: Vascular Surgery

## 2023-06-26 DIAGNOSIS — Z7901 Long term (current) use of anticoagulants: Secondary | ICD-10-CM | POA: Diagnosis not present

## 2023-06-26 DIAGNOSIS — R0602 Shortness of breath: Secondary | ICD-10-CM | POA: Diagnosis not present

## 2023-06-26 DIAGNOSIS — I82412 Acute embolism and thrombosis of left femoral vein: Secondary | ICD-10-CM | POA: Diagnosis present

## 2023-06-26 DIAGNOSIS — D6859 Other primary thrombophilia: Secondary | ICD-10-CM | POA: Diagnosis present

## 2023-06-26 DIAGNOSIS — Z86718 Personal history of other venous thrombosis and embolism: Secondary | ICD-10-CM | POA: Diagnosis not present

## 2023-06-26 DIAGNOSIS — I517 Cardiomegaly: Secondary | ICD-10-CM | POA: Diagnosis not present

## 2023-06-26 DIAGNOSIS — Z8781 Personal history of (healed) traumatic fracture: Secondary | ICD-10-CM | POA: Diagnosis not present

## 2023-06-26 DIAGNOSIS — F05 Delirium due to known physiological condition: Secondary | ICD-10-CM | POA: Diagnosis not present

## 2023-06-26 DIAGNOSIS — G894 Chronic pain syndrome: Secondary | ICD-10-CM | POA: Diagnosis not present

## 2023-06-26 DIAGNOSIS — I3481 Nonrheumatic mitral (valve) annulus calcification: Secondary | ICD-10-CM | POA: Diagnosis present

## 2023-06-26 DIAGNOSIS — Z1152 Encounter for screening for COVID-19: Secondary | ICD-10-CM | POA: Diagnosis not present

## 2023-06-26 DIAGNOSIS — F32A Depression, unspecified: Secondary | ICD-10-CM | POA: Diagnosis not present

## 2023-06-26 DIAGNOSIS — I509 Heart failure, unspecified: Secondary | ICD-10-CM | POA: Diagnosis present

## 2023-06-26 DIAGNOSIS — I2699 Other pulmonary embolism without acute cor pulmonale: Secondary | ICD-10-CM | POA: Diagnosis not present

## 2023-06-26 DIAGNOSIS — R279 Unspecified lack of coordination: Secondary | ICD-10-CM | POA: Diagnosis not present

## 2023-06-26 DIAGNOSIS — I110 Hypertensive heart disease with heart failure: Secondary | ICD-10-CM | POA: Diagnosis present

## 2023-06-26 DIAGNOSIS — I10 Essential (primary) hypertension: Secondary | ICD-10-CM | POA: Diagnosis not present

## 2023-06-26 DIAGNOSIS — R8271 Bacteriuria: Secondary | ICD-10-CM | POA: Diagnosis not present

## 2023-06-26 DIAGNOSIS — Z66 Do not resuscitate: Secondary | ICD-10-CM | POA: Diagnosis not present

## 2023-06-26 DIAGNOSIS — R7989 Other specified abnormal findings of blood chemistry: Secondary | ICD-10-CM | POA: Diagnosis not present

## 2023-06-26 DIAGNOSIS — R0600 Dyspnea, unspecified: Secondary | ICD-10-CM | POA: Diagnosis not present

## 2023-06-26 DIAGNOSIS — I21A1 Myocardial infarction type 2: Secondary | ICD-10-CM | POA: Diagnosis present

## 2023-06-26 DIAGNOSIS — E785 Hyperlipidemia, unspecified: Secondary | ICD-10-CM | POA: Diagnosis not present

## 2023-06-26 DIAGNOSIS — E1142 Type 2 diabetes mellitus with diabetic polyneuropathy: Secondary | ICD-10-CM | POA: Diagnosis not present

## 2023-06-26 DIAGNOSIS — K219 Gastro-esophageal reflux disease without esophagitis: Secondary | ICD-10-CM | POA: Diagnosis not present

## 2023-06-26 DIAGNOSIS — M6281 Muscle weakness (generalized): Secondary | ICD-10-CM | POA: Diagnosis not present

## 2023-06-26 DIAGNOSIS — R5381 Other malaise: Secondary | ICD-10-CM | POA: Diagnosis not present

## 2023-06-26 DIAGNOSIS — R8281 Pyuria: Secondary | ICD-10-CM | POA: Diagnosis not present

## 2023-06-26 DIAGNOSIS — I82402 Acute embolism and thrombosis of unspecified deep veins of left lower extremity: Secondary | ICD-10-CM | POA: Diagnosis not present

## 2023-06-26 DIAGNOSIS — R0902 Hypoxemia: Secondary | ICD-10-CM | POA: Diagnosis not present

## 2023-06-26 DIAGNOSIS — M4856XA Collapsed vertebra, not elsewhere classified, lumbar region, initial encounter for fracture: Secondary | ICD-10-CM | POA: Diagnosis present

## 2023-06-26 DIAGNOSIS — I700 Atherosclerosis of aorta: Secondary | ICD-10-CM | POA: Diagnosis present

## 2023-06-26 DIAGNOSIS — E119 Type 2 diabetes mellitus without complications: Secondary | ICD-10-CM | POA: Diagnosis not present

## 2023-06-26 DIAGNOSIS — J9601 Acute respiratory failure with hypoxia: Secondary | ICD-10-CM | POA: Diagnosis present

## 2023-06-26 DIAGNOSIS — K7689 Other specified diseases of liver: Secondary | ICD-10-CM | POA: Diagnosis not present

## 2023-06-26 DIAGNOSIS — K769 Liver disease, unspecified: Secondary | ICD-10-CM | POA: Diagnosis present

## 2023-06-26 DIAGNOSIS — I959 Hypotension, unspecified: Secondary | ICD-10-CM | POA: Diagnosis not present

## 2023-06-26 DIAGNOSIS — K5900 Constipation, unspecified: Secondary | ICD-10-CM | POA: Diagnosis not present

## 2023-06-26 DIAGNOSIS — J449 Chronic obstructive pulmonary disease, unspecified: Secondary | ICD-10-CM | POA: Diagnosis present

## 2023-06-26 DIAGNOSIS — K449 Diaphragmatic hernia without obstruction or gangrene: Secondary | ICD-10-CM | POA: Diagnosis not present

## 2023-06-26 DIAGNOSIS — M549 Dorsalgia, unspecified: Secondary | ICD-10-CM | POA: Diagnosis not present

## 2023-06-26 DIAGNOSIS — H9202 Otalgia, left ear: Secondary | ICD-10-CM | POA: Diagnosis present

## 2023-06-26 DIAGNOSIS — G8929 Other chronic pain: Secondary | ICD-10-CM | POA: Diagnosis not present

## 2023-06-26 DIAGNOSIS — Z515 Encounter for palliative care: Secondary | ICD-10-CM | POA: Diagnosis not present

## 2023-06-26 DIAGNOSIS — R9431 Abnormal electrocardiogram [ECG] [EKG]: Secondary | ICD-10-CM | POA: Diagnosis not present

## 2023-06-26 DIAGNOSIS — Z859 Personal history of malignant neoplasm, unspecified: Secondary | ICD-10-CM | POA: Diagnosis not present

## 2023-06-26 DIAGNOSIS — E1140 Type 2 diabetes mellitus with diabetic neuropathy, unspecified: Secondary | ICD-10-CM | POA: Diagnosis not present

## 2023-06-26 DIAGNOSIS — I214 Non-ST elevation (NSTEMI) myocardial infarction: Secondary | ICD-10-CM | POA: Diagnosis not present

## 2023-06-26 NOTE — Addendum Note (Signed)
 Addended by: Philippa Bray on: 06/26/2023 09:04 AM   Modules accepted: Orders

## 2023-06-26 NOTE — Telephone Encounter (Signed)
 Verbal orders given

## 2023-06-26 NOTE — Telephone Encounter (Signed)
 Called to follow up on pt's condition.  She reports that she is "moving slowly" this morning.  Has taken breathing treatments last night and this morning, feels like they've helped.  Let pt know that her nebulizer machine is ready for pick up. She said that she also needed to go to the hospital for chest x-ray.  Let her know that we can do the chest x-ray in the office when she picks up the nebulizer.  Asked what time she thought that she would be here.  Said that she had several appointments today 11am and 2pm.  She reported that she would have Trish call us  back to schedule.  LaToya is aware of the patient coming today.   Per Dr. Geralyn Knee chest x-ray entered to be done in office as stat.

## 2023-06-27 ENCOUNTER — Ambulatory Visit: Admitting: Internal Medicine

## 2023-06-27 ENCOUNTER — Telehealth: Payer: Self-pay

## 2023-06-27 ENCOUNTER — Other Ambulatory Visit (HOSPITAL_COMMUNITY): Payer: Self-pay

## 2023-06-27 NOTE — Telephone Encounter (Signed)
 Pt needs PA for neb solution

## 2023-06-27 NOTE — Telephone Encounter (Signed)
 Patient needs a Prior Authorization for Albuterol  0.083% (2.5mg /58ml) 25x56ml for Wheezing and SOB.

## 2023-06-27 NOTE — Telephone Encounter (Signed)
 Copied from CRM [id number]. Topic: Referral - Prior Authorization Question >> Jun 27, 2023  3:36 PM Felizardo Hotter wrote: Reason for CRM: Received call from [facility] per [place] pH: [phone] fax:[phone] albuterol  (PROVENTIL ) (2.5 MG/3ML) 0.083% nebulizer solution need prior authorization.

## 2023-06-27 NOTE — Telephone Encounter (Signed)
 Pharmacy Patient Advocate Encounter   Received notification from Pt Calls Messages that prior authorization for Albuterol  Sulfate (2.5 MG/3ML)0.083% nebulizer solution is required/requested.   Insurance verification completed.   The patient is insured through [facility] .   Per test claim: PA required; PA submitted to above mentioned insurance via CoverMyMeds Key/confirmation #/EOC [id number] Status is pending

## 2023-06-28 NOTE — Telephone Encounter (Signed)
 PA request has been Submitted. New Encounter has been or will be created for follow up. For additional info see Pharmacy Prior Auth telephone encounter from 06/27/2023.

## 2023-06-28 NOTE — Telephone Encounter (Signed)
 NOTED

## 2023-07-02 DIAGNOSIS — E669 Obesity, unspecified: Secondary | ICD-10-CM | POA: Diagnosis not present

## 2023-07-02 DIAGNOSIS — G4700 Insomnia, unspecified: Secondary | ICD-10-CM | POA: Diagnosis not present

## 2023-07-02 DIAGNOSIS — R0902 Hypoxemia: Secondary | ICD-10-CM | POA: Diagnosis not present

## 2023-07-02 DIAGNOSIS — Z79899 Other long term (current) drug therapy: Secondary | ICD-10-CM | POA: Diagnosis not present

## 2023-07-02 DIAGNOSIS — F419 Anxiety disorder, unspecified: Secondary | ICD-10-CM | POA: Diagnosis not present

## 2023-07-02 DIAGNOSIS — Z853 Personal history of malignant neoplasm of breast: Secondary | ICD-10-CM | POA: Diagnosis not present

## 2023-07-02 DIAGNOSIS — E041 Nontoxic single thyroid nodule: Secondary | ICD-10-CM | POA: Diagnosis not present

## 2023-07-02 DIAGNOSIS — R8271 Bacteriuria: Secondary | ICD-10-CM | POA: Diagnosis not present

## 2023-07-02 DIAGNOSIS — Z0289 Encounter for other administrative examinations: Secondary | ICD-10-CM | POA: Diagnosis not present

## 2023-07-02 DIAGNOSIS — J9601 Acute respiratory failure with hypoxia: Secondary | ICD-10-CM | POA: Diagnosis not present

## 2023-07-02 DIAGNOSIS — K769 Liver disease, unspecified: Secondary | ICD-10-CM | POA: Diagnosis not present

## 2023-07-02 DIAGNOSIS — E118 Type 2 diabetes mellitus with unspecified complications: Secondary | ICD-10-CM | POA: Diagnosis not present

## 2023-07-02 DIAGNOSIS — I82402 Acute embolism and thrombosis of unspecified deep veins of left lower extremity: Secondary | ICD-10-CM | POA: Diagnosis not present

## 2023-07-02 DIAGNOSIS — R296 Repeated falls: Secondary | ICD-10-CM | POA: Diagnosis not present

## 2023-07-02 DIAGNOSIS — R262 Difficulty in walking, not elsewhere classified: Secondary | ICD-10-CM | POA: Diagnosis not present

## 2023-07-02 DIAGNOSIS — E7800 Pure hypercholesterolemia, unspecified: Secondary | ICD-10-CM | POA: Diagnosis not present

## 2023-07-02 DIAGNOSIS — I214 Non-ST elevation (NSTEMI) myocardial infarction: Secondary | ICD-10-CM | POA: Diagnosis not present

## 2023-07-02 DIAGNOSIS — G319 Degenerative disease of nervous system, unspecified: Secondary | ICD-10-CM | POA: Diagnosis not present

## 2023-07-02 DIAGNOSIS — I21A1 Myocardial infarction type 2: Secondary | ICD-10-CM | POA: Diagnosis not present

## 2023-07-02 DIAGNOSIS — R600 Localized edema: Secondary | ICD-10-CM | POA: Diagnosis not present

## 2023-07-02 DIAGNOSIS — R0609 Other forms of dyspnea: Secondary | ICD-10-CM | POA: Diagnosis not present

## 2023-07-02 DIAGNOSIS — K449 Diaphragmatic hernia without obstruction or gangrene: Secondary | ICD-10-CM | POA: Diagnosis not present

## 2023-07-02 DIAGNOSIS — I10 Essential (primary) hypertension: Secondary | ICD-10-CM | POA: Diagnosis not present

## 2023-07-02 DIAGNOSIS — E785 Hyperlipidemia, unspecified: Secondary | ICD-10-CM | POA: Diagnosis not present

## 2023-07-02 DIAGNOSIS — Z7982 Long term (current) use of aspirin: Secondary | ICD-10-CM | POA: Diagnosis not present

## 2023-07-02 DIAGNOSIS — I5033 Acute on chronic diastolic (congestive) heart failure: Secondary | ICD-10-CM | POA: Diagnosis not present

## 2023-07-02 DIAGNOSIS — F32A Depression, unspecified: Secondary | ICD-10-CM | POA: Diagnosis not present

## 2023-07-02 DIAGNOSIS — G894 Chronic pain syndrome: Secondary | ICD-10-CM | POA: Diagnosis not present

## 2023-07-02 DIAGNOSIS — M549 Dorsalgia, unspecified: Secondary | ICD-10-CM | POA: Diagnosis not present

## 2023-07-02 DIAGNOSIS — Z9181 History of falling: Secondary | ICD-10-CM | POA: Diagnosis not present

## 2023-07-02 DIAGNOSIS — I4891 Unspecified atrial fibrillation: Secondary | ICD-10-CM | POA: Diagnosis not present

## 2023-07-02 DIAGNOSIS — Z7901 Long term (current) use of anticoagulants: Secondary | ICD-10-CM | POA: Diagnosis not present

## 2023-07-02 DIAGNOSIS — Z6833 Body mass index (BMI) 33.0-33.9, adult: Secondary | ICD-10-CM | POA: Diagnosis not present

## 2023-07-02 DIAGNOSIS — I2699 Other pulmonary embolism without acute cor pulmonale: Secondary | ICD-10-CM | POA: Diagnosis not present

## 2023-07-02 DIAGNOSIS — K219 Gastro-esophageal reflux disease without esophagitis: Secondary | ICD-10-CM | POA: Diagnosis not present

## 2023-07-02 DIAGNOSIS — E119 Type 2 diabetes mellitus without complications: Secondary | ICD-10-CM | POA: Diagnosis not present

## 2023-07-02 DIAGNOSIS — M6281 Muscle weakness (generalized): Secondary | ICD-10-CM | POA: Diagnosis not present

## 2023-07-02 DIAGNOSIS — R9389 Abnormal findings on diagnostic imaging of other specified body structures: Secondary | ICD-10-CM | POA: Diagnosis not present

## 2023-07-02 DIAGNOSIS — D649 Anemia, unspecified: Secondary | ICD-10-CM | POA: Diagnosis not present

## 2023-07-02 DIAGNOSIS — E782 Mixed hyperlipidemia: Secondary | ICD-10-CM | POA: Diagnosis not present

## 2023-07-02 DIAGNOSIS — K5900 Constipation, unspecified: Secondary | ICD-10-CM | POA: Diagnosis not present

## 2023-07-02 DIAGNOSIS — S32000A Wedge compression fracture of unspecified lumbar vertebra, initial encounter for closed fracture: Secondary | ICD-10-CM | POA: Diagnosis not present

## 2023-07-02 DIAGNOSIS — R300 Dysuria: Secondary | ICD-10-CM | POA: Diagnosis not present

## 2023-07-02 DIAGNOSIS — E1142 Type 2 diabetes mellitus with diabetic polyneuropathy: Secondary | ICD-10-CM | POA: Diagnosis not present

## 2023-07-02 DIAGNOSIS — Z86711 Personal history of pulmonary embolism: Secondary | ICD-10-CM | POA: Diagnosis not present

## 2023-07-02 DIAGNOSIS — R5381 Other malaise: Secondary | ICD-10-CM | POA: Diagnosis not present

## 2023-07-02 DIAGNOSIS — R059 Cough, unspecified: Secondary | ICD-10-CM | POA: Diagnosis not present

## 2023-07-02 DIAGNOSIS — N390 Urinary tract infection, site not specified: Secondary | ICD-10-CM | POA: Diagnosis not present

## 2023-07-02 DIAGNOSIS — K7689 Other specified diseases of liver: Secondary | ICD-10-CM | POA: Diagnosis not present

## 2023-07-02 DIAGNOSIS — J439 Emphysema, unspecified: Secondary | ICD-10-CM | POA: Diagnosis not present

## 2023-07-02 DIAGNOSIS — R279 Unspecified lack of coordination: Secondary | ICD-10-CM | POA: Diagnosis not present

## 2023-07-02 DIAGNOSIS — R8281 Pyuria: Secondary | ICD-10-CM | POA: Diagnosis not present

## 2023-07-02 DIAGNOSIS — Z556 Problems related to health literacy: Secondary | ICD-10-CM | POA: Diagnosis not present

## 2023-07-02 DIAGNOSIS — B3731 Acute candidiasis of vulva and vagina: Secondary | ICD-10-CM | POA: Diagnosis not present

## 2023-07-02 DIAGNOSIS — E871 Hypo-osmolality and hyponatremia: Secondary | ICD-10-CM | POA: Diagnosis not present

## 2023-07-02 DIAGNOSIS — I110 Hypertensive heart disease with heart failure: Secondary | ICD-10-CM | POA: Diagnosis not present

## 2023-07-02 DIAGNOSIS — G8921 Chronic pain due to trauma: Secondary | ICD-10-CM | POA: Diagnosis not present

## 2023-07-03 ENCOUNTER — Encounter: Payer: Self-pay | Admitting: Internal Medicine

## 2023-07-03 DIAGNOSIS — K769 Liver disease, unspecified: Secondary | ICD-10-CM | POA: Insufficient documentation

## 2023-07-04 DIAGNOSIS — J9601 Acute respiratory failure with hypoxia: Secondary | ICD-10-CM | POA: Diagnosis not present

## 2023-07-04 DIAGNOSIS — I2699 Other pulmonary embolism without acute cor pulmonale: Secondary | ICD-10-CM | POA: Diagnosis not present

## 2023-07-04 DIAGNOSIS — E782 Mixed hyperlipidemia: Secondary | ICD-10-CM | POA: Diagnosis not present

## 2023-07-04 DIAGNOSIS — E1142 Type 2 diabetes mellitus with diabetic polyneuropathy: Secondary | ICD-10-CM | POA: Diagnosis not present

## 2023-07-04 DIAGNOSIS — K7689 Other specified diseases of liver: Secondary | ICD-10-CM | POA: Diagnosis not present

## 2023-07-04 DIAGNOSIS — R8271 Bacteriuria: Secondary | ICD-10-CM | POA: Diagnosis not present

## 2023-07-04 DIAGNOSIS — G8921 Chronic pain due to trauma: Secondary | ICD-10-CM | POA: Diagnosis not present

## 2023-07-04 DIAGNOSIS — K219 Gastro-esophageal reflux disease without esophagitis: Secondary | ICD-10-CM | POA: Diagnosis not present

## 2023-07-04 DIAGNOSIS — I82402 Acute embolism and thrombosis of unspecified deep veins of left lower extremity: Secondary | ICD-10-CM | POA: Diagnosis not present

## 2023-07-04 DIAGNOSIS — R600 Localized edema: Secondary | ICD-10-CM | POA: Diagnosis not present

## 2023-07-04 DIAGNOSIS — S32000A Wedge compression fracture of unspecified lumbar vertebra, initial encounter for closed fracture: Secondary | ICD-10-CM | POA: Diagnosis not present

## 2023-07-04 DIAGNOSIS — I10 Essential (primary) hypertension: Secondary | ICD-10-CM | POA: Diagnosis not present

## 2023-07-04 NOTE — Telephone Encounter (Signed)
 Pharmacy Patient Advocate Encounter  Received notification from [facility] [employer] that Prior Authorization for Albuterol  Sulfate (2.5 MG/3ML)0.083% nebulizer solution has been APPROVED from 06/25/2023 to Until further notice   PA #/Case ID/Reference #: [id number]

## 2023-07-05 NOTE — Telephone Encounter (Signed)
 Pt is currently admitted to [facility]

## 2023-07-05 NOTE — Telephone Encounter (Signed)
 Noted.

## 2023-07-06 ENCOUNTER — Ambulatory Visit (INDEPENDENT_AMBULATORY_CARE_PROVIDER_SITE_OTHER): Admitting: Nurse Practitioner

## 2023-07-06 ENCOUNTER — Encounter (INDEPENDENT_AMBULATORY_CARE_PROVIDER_SITE_OTHER)

## 2023-07-06 DIAGNOSIS — I82402 Acute embolism and thrombosis of unspecified deep veins of left lower extremity: Secondary | ICD-10-CM | POA: Diagnosis not present

## 2023-07-06 DIAGNOSIS — I10 Essential (primary) hypertension: Secondary | ICD-10-CM | POA: Diagnosis not present

## 2023-07-06 DIAGNOSIS — G8921 Chronic pain due to trauma: Secondary | ICD-10-CM | POA: Diagnosis not present

## 2023-07-06 DIAGNOSIS — J9601 Acute respiratory failure with hypoxia: Secondary | ICD-10-CM | POA: Diagnosis not present

## 2023-07-06 DIAGNOSIS — R600 Localized edema: Secondary | ICD-10-CM | POA: Diagnosis not present

## 2023-07-06 DIAGNOSIS — E119 Type 2 diabetes mellitus without complications: Secondary | ICD-10-CM | POA: Diagnosis not present

## 2023-07-06 DIAGNOSIS — S32000A Wedge compression fracture of unspecified lumbar vertebra, initial encounter for closed fracture: Secondary | ICD-10-CM | POA: Diagnosis not present

## 2023-07-06 DIAGNOSIS — I2699 Other pulmonary embolism without acute cor pulmonale: Secondary | ICD-10-CM | POA: Diagnosis not present

## 2023-07-09 DIAGNOSIS — I82402 Acute embolism and thrombosis of unspecified deep veins of left lower extremity: Secondary | ICD-10-CM | POA: Diagnosis not present

## 2023-07-09 DIAGNOSIS — E118 Type 2 diabetes mellitus with unspecified complications: Secondary | ICD-10-CM | POA: Diagnosis not present

## 2023-07-09 DIAGNOSIS — S32000A Wedge compression fracture of unspecified lumbar vertebra, initial encounter for closed fracture: Secondary | ICD-10-CM | POA: Diagnosis not present

## 2023-07-09 DIAGNOSIS — R600 Localized edema: Secondary | ICD-10-CM | POA: Diagnosis not present

## 2023-07-09 DIAGNOSIS — G8921 Chronic pain due to trauma: Secondary | ICD-10-CM | POA: Diagnosis not present

## 2023-07-09 DIAGNOSIS — I10 Essential (primary) hypertension: Secondary | ICD-10-CM | POA: Diagnosis not present

## 2023-07-09 DIAGNOSIS — B3731 Acute candidiasis of vulva and vagina: Secondary | ICD-10-CM | POA: Diagnosis not present

## 2023-07-09 DIAGNOSIS — I2699 Other pulmonary embolism without acute cor pulmonale: Secondary | ICD-10-CM | POA: Diagnosis not present

## 2023-07-09 DIAGNOSIS — E782 Mixed hyperlipidemia: Secondary | ICD-10-CM | POA: Diagnosis not present

## 2023-07-10 DIAGNOSIS — E119 Type 2 diabetes mellitus without complications: Secondary | ICD-10-CM | POA: Diagnosis not present

## 2023-07-10 DIAGNOSIS — S32000A Wedge compression fracture of unspecified lumbar vertebra, initial encounter for closed fracture: Secondary | ICD-10-CM | POA: Diagnosis not present

## 2023-07-10 DIAGNOSIS — G8921 Chronic pain due to trauma: Secondary | ICD-10-CM | POA: Diagnosis not present

## 2023-07-10 DIAGNOSIS — I2699 Other pulmonary embolism without acute cor pulmonale: Secondary | ICD-10-CM | POA: Diagnosis not present

## 2023-07-10 DIAGNOSIS — K219 Gastro-esophageal reflux disease without esophagitis: Secondary | ICD-10-CM | POA: Diagnosis not present

## 2023-07-10 DIAGNOSIS — I10 Essential (primary) hypertension: Secondary | ICD-10-CM | POA: Diagnosis not present

## 2023-07-10 DIAGNOSIS — I82402 Acute embolism and thrombosis of unspecified deep veins of left lower extremity: Secondary | ICD-10-CM | POA: Diagnosis not present

## 2023-07-10 DIAGNOSIS — J9601 Acute respiratory failure with hypoxia: Secondary | ICD-10-CM | POA: Diagnosis not present

## 2023-07-10 DIAGNOSIS — E782 Mixed hyperlipidemia: Secondary | ICD-10-CM | POA: Diagnosis not present

## 2023-07-10 DIAGNOSIS — K7689 Other specified diseases of liver: Secondary | ICD-10-CM | POA: Diagnosis not present

## 2023-07-10 DIAGNOSIS — R8271 Bacteriuria: Secondary | ICD-10-CM | POA: Diagnosis not present

## 2023-07-10 NOTE — Progress Notes (Signed)
 This encounter was created in error - please disregard.

## 2023-07-10 NOTE — Patient Outreach (Signed)
 Complex Care Management   Visit Note  07/10/2023  Name:  Cheila R [place] MRN: [id number] DOB: 1940-09-03  Situation: Referral received for Complex Care Management related to Heart Failure Telephone call to patient.  HIPAA verified. Patient states she is currently in a skilled nursing facility.  Unable to complete call with patient.  Background:   Past Medical History:  Diagnosis Date   Anemia    Arthritis    Atrial fibrillation (HCC)    Breast cancer (HCC)    s/p lumpectomy 1992.  s/p chemo and xrt left breast   Diabetes mellitus (HCC)    Edema    feet/legs   Gastric ulcer    GERD (gastroesophageal reflux disease)    HOH (hard of hearing)    aides   Hypercholesterolemia    Hypertension    Personal history of chemotherapy    Pulmonary emboli (HCC)    Recommendation: Follow up with primary care provider post discharge from SNF.  Follow Up Plan:   Telephone follow-up in 3-4 weeks RN care manager  Verba Girt RN, BSN, CCM [place]  [facility], Population Health Case Manager Phone: [phone]

## 2023-07-11 DIAGNOSIS — I10 Essential (primary) hypertension: Secondary | ICD-10-CM | POA: Diagnosis not present

## 2023-07-11 DIAGNOSIS — R600 Localized edema: Secondary | ICD-10-CM | POA: Diagnosis not present

## 2023-07-11 DIAGNOSIS — G8921 Chronic pain due to trauma: Secondary | ICD-10-CM | POA: Diagnosis not present

## 2023-07-11 DIAGNOSIS — I2699 Other pulmonary embolism without acute cor pulmonale: Secondary | ICD-10-CM | POA: Diagnosis not present

## 2023-07-11 DIAGNOSIS — B3731 Acute candidiasis of vulva and vagina: Secondary | ICD-10-CM | POA: Diagnosis not present

## 2023-07-11 DIAGNOSIS — I82402 Acute embolism and thrombosis of unspecified deep veins of left lower extremity: Secondary | ICD-10-CM | POA: Diagnosis not present

## 2023-07-11 DIAGNOSIS — E119 Type 2 diabetes mellitus without complications: Secondary | ICD-10-CM | POA: Diagnosis not present

## 2023-07-11 DIAGNOSIS — S32000A Wedge compression fracture of unspecified lumbar vertebra, initial encounter for closed fracture: Secondary | ICD-10-CM | POA: Diagnosis not present

## 2023-07-12 ENCOUNTER — Inpatient Hospital Stay: Admitting: Internal Medicine

## 2023-07-12 ENCOUNTER — Encounter (HOSPITAL_COMMUNITY): Payer: Self-pay

## 2023-07-12 DIAGNOSIS — I5033 Acute on chronic diastolic (congestive) heart failure: Secondary | ICD-10-CM | POA: Diagnosis not present

## 2023-07-12 DIAGNOSIS — E119 Type 2 diabetes mellitus without complications: Secondary | ICD-10-CM | POA: Diagnosis not present

## 2023-07-12 DIAGNOSIS — E871 Hypo-osmolality and hyponatremia: Secondary | ICD-10-CM | POA: Diagnosis not present

## 2023-07-12 DIAGNOSIS — I110 Hypertensive heart disease with heart failure: Secondary | ICD-10-CM | POA: Diagnosis not present

## 2023-07-12 DIAGNOSIS — Z7982 Long term (current) use of aspirin: Secondary | ICD-10-CM | POA: Diagnosis not present

## 2023-07-12 DIAGNOSIS — Z86711 Personal history of pulmonary embolism: Secondary | ICD-10-CM | POA: Diagnosis not present

## 2023-07-12 DIAGNOSIS — Z9181 History of falling: Secondary | ICD-10-CM | POA: Diagnosis not present

## 2023-07-12 DIAGNOSIS — R262 Difficulty in walking, not elsewhere classified: Secondary | ICD-10-CM | POA: Diagnosis not present

## 2023-07-12 DIAGNOSIS — I10 Essential (primary) hypertension: Secondary | ICD-10-CM | POA: Diagnosis not present

## 2023-07-12 DIAGNOSIS — Z556 Problems related to health literacy: Secondary | ICD-10-CM | POA: Diagnosis not present

## 2023-07-12 DIAGNOSIS — M6281 Muscle weakness (generalized): Secondary | ICD-10-CM | POA: Diagnosis not present

## 2023-07-12 DIAGNOSIS — E7800 Pure hypercholesterolemia, unspecified: Secondary | ICD-10-CM | POA: Diagnosis not present

## 2023-07-12 DIAGNOSIS — R296 Repeated falls: Secondary | ICD-10-CM | POA: Diagnosis not present

## 2023-07-12 DIAGNOSIS — I4891 Unspecified atrial fibrillation: Secondary | ICD-10-CM | POA: Diagnosis not present

## 2023-07-12 DIAGNOSIS — Z6833 Body mass index (BMI) 33.0-33.9, adult: Secondary | ICD-10-CM | POA: Diagnosis not present

## 2023-07-12 DIAGNOSIS — E669 Obesity, unspecified: Secondary | ICD-10-CM | POA: Diagnosis not present

## 2023-07-12 DIAGNOSIS — Z79899 Other long term (current) drug therapy: Secondary | ICD-10-CM | POA: Diagnosis not present

## 2023-07-12 DIAGNOSIS — F419 Anxiety disorder, unspecified: Secondary | ICD-10-CM | POA: Diagnosis not present

## 2023-07-12 DIAGNOSIS — R0902 Hypoxemia: Secondary | ICD-10-CM | POA: Diagnosis not present

## 2023-07-13 ENCOUNTER — Encounter: Payer: Self-pay | Admitting: Internal Medicine

## 2023-07-13 DIAGNOSIS — S32000A Wedge compression fracture of unspecified lumbar vertebra, initial encounter for closed fracture: Secondary | ICD-10-CM | POA: Diagnosis not present

## 2023-07-13 DIAGNOSIS — B3731 Acute candidiasis of vulva and vagina: Secondary | ICD-10-CM | POA: Diagnosis not present

## 2023-07-13 DIAGNOSIS — I10 Essential (primary) hypertension: Secondary | ICD-10-CM | POA: Diagnosis not present

## 2023-07-13 DIAGNOSIS — E119 Type 2 diabetes mellitus without complications: Secondary | ICD-10-CM | POA: Diagnosis not present

## 2023-07-13 DIAGNOSIS — I2699 Other pulmonary embolism without acute cor pulmonale: Secondary | ICD-10-CM | POA: Diagnosis not present

## 2023-07-13 DIAGNOSIS — R600 Localized edema: Secondary | ICD-10-CM | POA: Diagnosis not present

## 2023-07-13 DIAGNOSIS — R300 Dysuria: Secondary | ICD-10-CM | POA: Diagnosis not present

## 2023-07-13 DIAGNOSIS — G8921 Chronic pain due to trauma: Secondary | ICD-10-CM | POA: Diagnosis not present

## 2023-07-13 DIAGNOSIS — I82402 Acute embolism and thrombosis of unspecified deep veins of left lower extremity: Secondary | ICD-10-CM | POA: Diagnosis not present

## 2023-07-13 DIAGNOSIS — G4700 Insomnia, unspecified: Secondary | ICD-10-CM | POA: Diagnosis not present

## 2023-07-15 NOTE — Telephone Encounter (Signed)
 Please schedule an appt to help clarify what is needed, dates, frequency, etc. Ok if needs to be virtual. Her mother will need to be present.

## 2023-07-16 DIAGNOSIS — G8921 Chronic pain due to trauma: Secondary | ICD-10-CM | POA: Diagnosis not present

## 2023-07-16 DIAGNOSIS — E119 Type 2 diabetes mellitus without complications: Secondary | ICD-10-CM | POA: Diagnosis not present

## 2023-07-16 DIAGNOSIS — I10 Essential (primary) hypertension: Secondary | ICD-10-CM | POA: Diagnosis not present

## 2023-07-16 DIAGNOSIS — N390 Urinary tract infection, site not specified: Secondary | ICD-10-CM | POA: Diagnosis not present

## 2023-07-16 DIAGNOSIS — R059 Cough, unspecified: Secondary | ICD-10-CM | POA: Diagnosis not present

## 2023-07-16 DIAGNOSIS — R300 Dysuria: Secondary | ICD-10-CM | POA: Diagnosis not present

## 2023-07-16 DIAGNOSIS — I82402 Acute embolism and thrombosis of unspecified deep veins of left lower extremity: Secondary | ICD-10-CM | POA: Diagnosis not present

## 2023-07-16 DIAGNOSIS — S32000A Wedge compression fracture of unspecified lumbar vertebra, initial encounter for closed fracture: Secondary | ICD-10-CM | POA: Diagnosis not present

## 2023-07-16 DIAGNOSIS — G4700 Insomnia, unspecified: Secondary | ICD-10-CM | POA: Diagnosis not present

## 2023-07-16 DIAGNOSIS — I2699 Other pulmonary embolism without acute cor pulmonale: Secondary | ICD-10-CM | POA: Diagnosis not present

## 2023-07-16 NOTE — Telephone Encounter (Signed)
 appt scheduled

## 2023-07-18 DIAGNOSIS — R0609 Other forms of dyspnea: Secondary | ICD-10-CM | POA: Diagnosis not present

## 2023-07-18 DIAGNOSIS — I2699 Other pulmonary embolism without acute cor pulmonale: Secondary | ICD-10-CM | POA: Diagnosis not present

## 2023-07-18 DIAGNOSIS — J439 Emphysema, unspecified: Secondary | ICD-10-CM | POA: Diagnosis not present

## 2023-07-18 DIAGNOSIS — E041 Nontoxic single thyroid nodule: Secondary | ICD-10-CM | POA: Diagnosis not present

## 2023-07-19 ENCOUNTER — Inpatient Hospital Stay: Admitting: Internal Medicine

## 2023-07-20 ENCOUNTER — Encounter: Payer: Self-pay | Admitting: Internal Medicine

## 2023-07-20 ENCOUNTER — Telehealth (INDEPENDENT_AMBULATORY_CARE_PROVIDER_SITE_OTHER): Admitting: Internal Medicine

## 2023-07-20 VITALS — Ht 67.0 in | Wt 227.0 lb

## 2023-07-20 DIAGNOSIS — G4700 Insomnia, unspecified: Secondary | ICD-10-CM | POA: Diagnosis not present

## 2023-07-20 DIAGNOSIS — G319 Degenerative disease of nervous system, unspecified: Secondary | ICD-10-CM | POA: Diagnosis not present

## 2023-07-20 DIAGNOSIS — D649 Anemia, unspecified: Secondary | ICD-10-CM | POA: Diagnosis not present

## 2023-07-20 DIAGNOSIS — K769 Liver disease, unspecified: Secondary | ICD-10-CM

## 2023-07-20 DIAGNOSIS — Z0289 Encounter for other administrative examinations: Secondary | ICD-10-CM | POA: Diagnosis not present

## 2023-07-20 DIAGNOSIS — E7800 Pure hypercholesterolemia, unspecified: Secondary | ICD-10-CM | POA: Diagnosis not present

## 2023-07-20 DIAGNOSIS — E119 Type 2 diabetes mellitus without complications: Secondary | ICD-10-CM | POA: Diagnosis not present

## 2023-07-20 DIAGNOSIS — I10 Essential (primary) hypertension: Secondary | ICD-10-CM

## 2023-07-20 DIAGNOSIS — R600 Localized edema: Secondary | ICD-10-CM | POA: Diagnosis not present

## 2023-07-20 DIAGNOSIS — R9389 Abnormal findings on diagnostic imaging of other specified body structures: Secondary | ICD-10-CM

## 2023-07-20 DIAGNOSIS — E1142 Type 2 diabetes mellitus with diabetic polyneuropathy: Secondary | ICD-10-CM | POA: Diagnosis not present

## 2023-07-20 DIAGNOSIS — N390 Urinary tract infection, site not specified: Secondary | ICD-10-CM | POA: Diagnosis not present

## 2023-07-20 DIAGNOSIS — E041 Nontoxic single thyroid nodule: Secondary | ICD-10-CM

## 2023-07-20 DIAGNOSIS — R300 Dysuria: Secondary | ICD-10-CM | POA: Diagnosis not present

## 2023-07-20 DIAGNOSIS — Z853 Personal history of malignant neoplasm of breast: Secondary | ICD-10-CM

## 2023-07-20 DIAGNOSIS — I82402 Acute embolism and thrombosis of unspecified deep veins of left lower extremity: Secondary | ICD-10-CM | POA: Diagnosis not present

## 2023-07-20 DIAGNOSIS — G8921 Chronic pain due to trauma: Secondary | ICD-10-CM | POA: Diagnosis not present

## 2023-07-20 DIAGNOSIS — S32000A Wedge compression fracture of unspecified lumbar vertebra, initial encounter for closed fracture: Secondary | ICD-10-CM | POA: Diagnosis not present

## 2023-07-20 DIAGNOSIS — I2699 Other pulmonary embolism without acute cor pulmonale: Secondary | ICD-10-CM | POA: Diagnosis not present

## 2023-07-20 NOTE — Progress Notes (Signed)
 [name], female   DOB: 01/21/1941, 83 y.o.   MRN: [id number]   Virtual Visit via video Note  I connected with Anita Walker by a video enabled telemedicine application and verified that I am speaking with the correct person using two identifiers. Location patient: home Location provider: work Persons participating in the virtual visit: patient, provider and pts daughter [place].   The limitations, risks, security and privacy concerns of performing an evaluation and management service by video and the availability of in person appointments have been discussed. It has also been discussed with the patient that there may be a patient responsible charge related to this service. The patient expressed understanding and agreed to proceed.   Reason for visit: work in appt  HPI: Work in to discuss FMLA forms. Admitted 06/26/23 - after presenting with four days of dyspnea and found to have hypoxic respiratory failure and bilateral PE with right heart strain and DVT left common femoral vein. TTE (4/23) LVEF >55%, RA/RV mildly dilated with normal systolic function, mitral annular calcification and moderate aortic sclerosis. Likely etiology of PE and DVT is immobility complicated by a stress fracture. Elected medication management. Placed on eliquis. Continues oxygen. Found to have indeterminate 2.4cm right hepatic lobe lesion found on CTA chest 4/22. Recommended MRI to better characterize. Recommended to hold losartan . Was placed on eliquis and continues on eliquis. She was discharged to SNF. Receiving PT. Her daughter has been and will be responsible for helping her with ADLs and helping to get her to her appts. Needs FMLA forms completed. Breathing is better. No increased cough or congestion reported.    ROS: See pertinent positives and negatives per HPI.  Past Medical History:  Diagnosis Date   Anemia    Arthritis    Atrial fibrillation (HCC)    Breast cancer (HCC)    s/p lumpectomy  1992.  s/p chemo and xrt left breast   Diabetes mellitus (HCC)    Edema    feet/legs   Gastric ulcer    GERD (gastroesophageal reflux disease)    HOH (hard of hearing)    aides   Hypercholesterolemia    Hypertension    Personal history of chemotherapy    Pulmonary emboli [facility])     Past Surgical History:  Procedure Laterality Date   BREAST LUMPECTOMY  03/06/1990   left breast   CATARACT EXTRACTION W/PHACO Right 12/26/2016   Procedure: CATARACT EXTRACTION PHACO AND INTRAOCULAR LENS PLACEMENT (IOC)-RIGHT DIABETIC;  Surgeon: Clair Crews, MD;  Location: [facility] ORS;  Service: Ophthalmology;  Laterality: Right;  US  00:47 AP% 24.5 CDE 11.62 Fluid pack lot # [id number]   CATARACT EXTRACTION W/PHACO Left 01/23/2017   Procedure: CATARACT EXTRACTION PHACO AND INTRAOCULAR LENS PLACEMENT (IOC);  Surgeon: Clair Crews, MD;  Location: [facility] ORS;  Service: Ophthalmology;  Laterality: Left;  US  00:50 AP% 16.1 CDE 8.18 Fluid pack lot #[id number]   COLONOSCOPY WITH PROPOFOL  N/A 07/08/2019   Procedure: COLONOSCOPY WITH PROPOFOL ;  Surgeon: Marnee Sink, MD;  Location: [facility] ENDOSCOPY;  Service: Endoscopy;  Laterality: N/A;    Family History  Problem Relation Age of Onset   Heart disease Father    Heart disease Brother        s/p CABG   Breast cancer Daughter    Colon cancer Neg Hx     SOCIAL HX: reviewed.    Current Outpatient Medications:    acetaminophen  (TYLENOL ) 325 MG tablet, Take by mouth., Disp: , Rfl:    alum & mag  hydroxide-simeth (MAALOX/MYLANTA) 200-200-20 MG/5ML suspension, Take 15 mLs by mouth every 6 (six) hours as needed for indigestion or heartburn., Disp: 355 mL, Rfl: 0   apixaban (ELIQUIS) 5 MG TABS tablet, Take 5 mg by mouth 2 (two) times daily., Disp: , Rfl:    aspirin  EC 81 MG tablet, Take 1 tablet (81 mg total) by mouth daily. Swallow whole., Disp: 30 tablet, Rfl: 12   diphenhydrAMINE  (BENADRYL ) 25 mg capsule, Take 25 mg at bedtime as needed by mouth for sleep. , Disp:  , Rfl:    escitalopram (LEXAPRO) 10 MG tablet, Take 10 mg by mouth daily., Disp: , Rfl:    feeding supplement (ENSURE ENLIVE / ENSURE PLUS) LIQD, Take 237 mLs by mouth 2 (two) times daily between meals., Disp: 237 mL, Rfl: 12   fluticasone  (FLONASE ) 50 MCG/ACT nasal spray, Place 2 sprays into both nostrils daily., Disp: , Rfl:    glucose blood (CONTOUR TEST) test strip, USE TO TEST BLOOD SUGAR ONCE DAILY, Disp: 50 each, Rfl: 11   losartan  (COZAAR ) 50 MG tablet, Take 50 mg by mouth daily., Disp: , Rfl:    metoprolol tartrate (LOPRESSOR) 25 MG tablet, Take 25 mg by mouth daily., Disp: , Rfl:    Multiple Vitamin (MULTIVITAMIN WITH MINERALS) TABS tablet, Take 1 tablet daily by mouth., Disp: , Rfl:    ondansetron  (ZOFRAN -ODT) 4 MG disintegrating tablet, Take 1 tablet (4 mg total) by mouth every 8 (eight) hours as needed for nausea or vomiting., Disp: 20 tablet, Rfl: 0   pantoprazole  (PROTONIX ) 40 MG tablet, TAKE 1 TABLET(40 MG) BY MOUTH DAILY, Disp: 90 tablet, Rfl: 3   torsemide (DEMADEX) 20 MG tablet, Take 20 mg by mouth daily., Disp: , Rfl:    Docusate Calcium (STOOL SOFTENER PO), Take by mouth., Disp: , Rfl:   EXAM:  GENERAL: alert, oriented, appears well and in no acute distress  HEENT: atraumatic, conjunttiva clear, no obvious abnormalities on inspection of external nose and ears  NECK: normal movements of the head and neck  LUNGS: on inspection no signs of respiratory distress, breathing rate appears normal, no obvious gross SOB, gasping or wheezing  CV: no obvious cyanosis  PSYCH/NEURO: pleasant and cooperative, no obvious depression or anxiety, speech and thought processing grossly intact  ASSESSMENT AND PLAN:  Discussed the following assessment and plan:  Problem List Items Addressed This Visit     Abnormal chest CT - Primary   CT 06/2023 -  Examination positive for extensive bilateral pulmonary emboli with enlarged pulmonary artery suggesting pulmonary hypertension but no CT  signs of right heart strain.  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  Large hiatal hernia. Continues on eliquis. Breathing stable.       Anemia   Follow cbc.       Cerebral atrophy (HCC)   Foun on previous CT. Mild.       Diabetic polyneuropathy associated with type 2 diabetes mellitus (HCC)   Sugars have been controlled.  Continue low carb diet and exercise.  Follow met b and A1c. No changes today.  Lab Results  Component Value Date   HGBA1C 6.0 03/19/2023         Encounter for completion of form with patient   FMLA paper work completed. Her mother needs help with ADLs and getting to her lab appts. Form completed.       Essential hypertension   During hospitalization, advised to hold losartan . Follow pressures. Will plan for f/u metabolic panel. No changes today in  medication.       Relevant Medications   apixaban (ELIQUIS) 5 MG TABS tablet   History of breast cancer   Mammogram 01/03/23 - [facility] I.       Hypercholesteremia   On simvastatin .  Low cholesterol diet and exercise.  Follow lipid panel and liver function tests.  No change today.  Lab Results  Component Value Date   CHOL 155 03/19/2023   HDL 89.70 03/19/2023   LDLCALC 53 03/19/2023   TRIG 62.0 03/19/2023   CHOLHDL 2 03/19/2023         Relevant Medications   apixaban (ELIQUIS) 5 MG TABS tablet   Liver lesion   Found during recent CT - during hospitalization. Plan for f/u MRI abdomen.  In SNF now. Will need tp plan to arrange once stronger and after discharge.       Pure hypercholesterolemia   Low cholesterol diet. Exercise as tolerated. Follow lipid panel. Lab Results  Component Value Date   CHOL 155 03/19/2023   HDL 89.70 03/19/2023   LDLCALC 53 03/19/2023   TRIG 62.0 03/19/2023   CHOLHDL 2 03/19/2023         Relevant Medications   apixaban (ELIQUIS) 5 MG TABS tablet   Thyroid  nodule   Noticed on scan 03/2023. Will need f/u thyroid  ultrasound.        No follow-ups on  file.   I discussed the assessment and treatment plan with the patient. The patient was provided an opportunity to ask questions and all were answered. The patient agreed with the plan and demonstrated an understanding of the instructions.   The patient was advised to call back or seek an in-person evaluation if the symptoms worsen or if the condition fails to improve as anticipated.    Dellar Fenton, MD

## 2023-07-24 ENCOUNTER — Encounter (INDEPENDENT_AMBULATORY_CARE_PROVIDER_SITE_OTHER): Payer: Self-pay

## 2023-07-26 ENCOUNTER — Telehealth: Payer: Self-pay

## 2023-07-26 NOTE — Patient Outreach (Signed)
 Complex Care Management   Visit Note  07/26/2023  Name:  Anita Walker MRN: [id number] DOB: 12/04/1940  Situation: Success contact made with patient.  Patient states she is still in the rehab facility. She states she anticipates discharge next week. Patient request that her daughter Skylynne Schlechter be contacted regarding her health concerns.   Background:   Past Medical History:  Diagnosis Date   Anemia    Arthritis    Atrial fibrillation (HCC)    Breast cancer (HCC)    s/p lumpectomy 1992.  s/p chemo and xrt left breast   Diabetes mellitus (HCC)    Edema    feet/legs   Gastric ulcer    GERD (gastroesophageal reflux disease)    HOH (hard of hearing)    aides   Hypercholesterolemia    Hypertension    Personal history of chemotherapy    Pulmonary emboli (HCC)     Follow Up Plan:   RN will follow up with patient in 1 week  Erminio Nygard RN, BSN, CCM [place]  [facility], Population Health Case Manager Phone: [phone]

## 2023-07-27 DIAGNOSIS — I82402 Acute embolism and thrombosis of unspecified deep veins of left lower extremity: Secondary | ICD-10-CM | POA: Diagnosis not present

## 2023-07-27 DIAGNOSIS — E119 Type 2 diabetes mellitus without complications: Secondary | ICD-10-CM | POA: Diagnosis not present

## 2023-07-27 DIAGNOSIS — G4700 Insomnia, unspecified: Secondary | ICD-10-CM | POA: Diagnosis not present

## 2023-07-27 DIAGNOSIS — I2699 Other pulmonary embolism without acute cor pulmonale: Secondary | ICD-10-CM | POA: Diagnosis not present

## 2023-07-27 DIAGNOSIS — R600 Localized edema: Secondary | ICD-10-CM | POA: Diagnosis not present

## 2023-07-27 DIAGNOSIS — K7689 Other specified diseases of liver: Secondary | ICD-10-CM | POA: Diagnosis not present

## 2023-07-27 DIAGNOSIS — S32000A Wedge compression fracture of unspecified lumbar vertebra, initial encounter for closed fracture: Secondary | ICD-10-CM | POA: Diagnosis not present

## 2023-07-27 DIAGNOSIS — E782 Mixed hyperlipidemia: Secondary | ICD-10-CM | POA: Diagnosis not present

## 2023-07-27 DIAGNOSIS — K219 Gastro-esophageal reflux disease without esophagitis: Secondary | ICD-10-CM | POA: Diagnosis not present

## 2023-07-27 DIAGNOSIS — G8921 Chronic pain due to trauma: Secondary | ICD-10-CM | POA: Diagnosis not present

## 2023-07-27 DIAGNOSIS — N390 Urinary tract infection, site not specified: Secondary | ICD-10-CM | POA: Diagnosis not present

## 2023-07-27 DIAGNOSIS — I10 Essential (primary) hypertension: Secondary | ICD-10-CM | POA: Diagnosis not present

## 2023-07-29 ENCOUNTER — Encounter: Payer: Self-pay | Admitting: Internal Medicine

## 2023-07-29 DIAGNOSIS — I5033 Acute on chronic diastolic (congestive) heart failure: Secondary | ICD-10-CM | POA: Diagnosis not present

## 2023-07-29 DIAGNOSIS — Z0289 Encounter for other administrative examinations: Secondary | ICD-10-CM | POA: Insufficient documentation

## 2023-07-29 DIAGNOSIS — I4891 Unspecified atrial fibrillation: Secondary | ICD-10-CM | POA: Diagnosis not present

## 2023-07-29 DIAGNOSIS — E119 Type 2 diabetes mellitus without complications: Secondary | ICD-10-CM | POA: Diagnosis not present

## 2023-07-29 DIAGNOSIS — I10 Essential (primary) hypertension: Secondary | ICD-10-CM | POA: Diagnosis not present

## 2023-07-29 DIAGNOSIS — I110 Hypertensive heart disease with heart failure: Secondary | ICD-10-CM | POA: Diagnosis not present

## 2023-07-29 DIAGNOSIS — E871 Hypo-osmolality and hyponatremia: Secondary | ICD-10-CM | POA: Diagnosis not present

## 2023-07-29 NOTE — Assessment & Plan Note (Signed)
 FMLA paper work completed. Her mother needs help with ADLs and getting to her lab appts. Form completed.

## 2023-07-29 NOTE — Assessment & Plan Note (Signed)
 On simvastatin.  Low cholesterol diet and exercise.  Follow lipid panel and liver function tests.  No change today.  Lab Results  Component Value Date   CHOL 155 03/19/2023   HDL 89.70 03/19/2023   LDLCALC 53 03/19/2023   TRIG 62.0 03/19/2023   CHOLHDL 2 03/19/2023

## 2023-07-29 NOTE — Assessment & Plan Note (Signed)
 Low cholesterol diet. Exercise as tolerated. Follow lipid panel. Lab Results  Component Value Date   CHOL 155 03/19/2023   HDL 89.70 03/19/2023   LDLCALC 53 03/19/2023   TRIG 62.0 03/19/2023   CHOLHDL 2 03/19/2023

## 2023-07-29 NOTE — Assessment & Plan Note (Signed)
 Sugars have been controlled.  Continue low carb diet and exercise.  Follow met b and A1c. No changes today.  Lab Results  Component Value Date   HGBA1C 6.0 03/19/2023

## 2023-07-29 NOTE — Assessment & Plan Note (Signed)
 Foun on previous CT. Mild.

## 2023-07-29 NOTE — Assessment & Plan Note (Signed)
 Follow cbc.

## 2023-07-29 NOTE — Assessment & Plan Note (Signed)
 Found during recent CT - during hospitalization. Plan for f/u MRI abdomen.  In SNF now. Will need tp plan to arrange once stronger and after discharge.

## 2023-07-29 NOTE — Assessment & Plan Note (Signed)
 Noticed on scan 03/2023. Will need f/u thyroid  ultrasound.

## 2023-07-29 NOTE — Assessment & Plan Note (Signed)
 During hospitalization, advised to hold losartan . Follow pressures. Will plan for f/u metabolic panel. No changes today in medication.

## 2023-07-29 NOTE — Assessment & Plan Note (Signed)
 CT 06/2023 -  Examination positive for extensive bilateral pulmonary emboli with enlarged pulmonary artery suggesting pulmonary hypertension but no CT signs of right heart strain.  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  Large hiatal hernia. Continues on eliquis. Breathing stable.

## 2023-07-29 NOTE — Assessment & Plan Note (Signed)
 Mammogram 01/03/23 - Birads I.

## 2023-07-31 ENCOUNTER — Encounter: Payer: Self-pay | Admitting: Internal Medicine

## 2023-07-31 ENCOUNTER — Telehealth: Payer: Self-pay

## 2023-07-31 NOTE — Transitions of Care (Post Inpatient/ED Visit) (Signed)
 07/31/2023  Name: Anita Walker MRN: [id number] DOB: 11-23-1940  Today's TOC FU Call Status: Today's TOC FU Call Status:: Successful TOC FU Call Completed TOC FU Call Complete Date: 07/31/23 Patient's Name and Date of Birth confirmed.  Transition Care Management Follow-up Telephone Call Date of Discharge: 07/28/23 Discharge Facility: Other ([facility]) Name of Other ([facility]) Discharge Facility: [facility] Type of Discharge: Inpatient Admission Primary Inpatient Discharge Diagnosis:: ARF How have you been since you were released from the hospital?: Better Any questions or concerns?: No  Items Reviewed: Did you receive and understand the discharge instructions provided?: Yes Medications obtained,verified, and reconciled?: Yes (Medications Reviewed) Any new allergies since your discharge?: No Dietary orders reviewed?: Yes Do you have support at home?: Yes Name of Support/Comfort Primary Source: caregiver  Medications Reviewed Today: Medications Reviewed Today     Reviewed by Darrall Ellison, LPN (Licensed Practical Nurse) on 07/31/23 at 0919  Med List Status: <None>   Medication Order Taking? Sig Documenting Provider Last Dose Status Informant  acetaminophen  (TYLENOL ) 325 MG tablet [id number] No Take by mouth. [provider] Taking Active   alum & mag hydroxide-simeth (MAALOX/MYLANTA) 200-200-20 MG/5ML suspension [id number] No Take 15 mLs by mouth every 6 (six) hours as needed for indigestion or heartburn. Aisha Hove, MD Taking Active   apixaban (ELIQUIS) 5 MG TABS tablet [id number]  Take 5 mg by mouth 2 (two) times daily. [provider]  Active   aspirin  EC 81 MG tablet [id number] No Take 1 tablet (81 mg total) by mouth daily. Swallow whole. Aisha Hove, MD Taking Active   diphenhydrAMINE  (BENADRYL ) 25 mg capsule [id number] No Take 25 mg at bedtime as needed by mouth for sleep.  [provider] Taking Active Child  Docusate  Calcium (STOOL SOFTENER PO) [id number]  Take by mouth. [provider]  Active   escitalopram (LEXAPRO) 10 MG tablet [id number] No Take 10 mg by mouth daily. [provider] Taking Active   feeding supplement (ENSURE ENLIVE / ENSURE PLUS) LIQD [id number] No Take 237 mLs by mouth 2 (two) times daily between meals. Melvinia Stager, MD Taking Active Child  fluticasone  (FLONASE ) 50 MCG/ACT nasal spray [id number] No Place 2 sprays into both nostrils daily. [provider] Taking Active   glucose blood (CONTOUR TEST) test strip [id number] No USE TO TEST BLOOD SUGAR ONCE DAILY Dellar Fenton, MD Taking Active Child  losartan  (COZAAR ) 50 MG tablet [id number] No Take 50 mg by mouth daily. [provider] Taking Active   metoprolol tartrate (LOPRESSOR) 25 MG tablet [id number] No Take 25 mg by mouth daily. [provider] Taking Active   Multiple Vitamin (MULTIVITAMIN WITH MINERALS) TABS tablet [id number] No Take 1 tablet daily by mouth. [provider] Taking Active Child  ondansetron  (ZOFRAN -ODT) 4 MG disintegrating tablet [id number] No Take 1 tablet (4 mg total) by mouth every 8 (eight) hours as needed for nausea or vomiting. Narendra, Nischal, MD Taking Active   pantoprazole  (PROTONIX ) 40 MG tablet [id number] No TAKE 1 TABLET(40 MG) BY MOUTH DAILY Dellar Fenton, MD Taking Active Child  torsemide (DEMADEX) 20 MG tablet [id number] No Take 20 mg by mouth daily. [provider] Taking Active            Med Note Marrie Sizer, DAVINA E   Fri Jun 22, 2023  9:30 AM) Daughter states patient takes 1 tablet 2 x per day.             Home Care and Equipment/Supplies: Were Home Health Services  Ordered?: Yes Name of Home Health Agency:: unknown Has Agency set up a time to come to your home?: Yes First Home Health Visit Date: 07/29/23 Any new equipment or medical supplies ordered?: NA  Functional Questionnaire: Do you need assistance with bathing/showering or  dressing?: No Do you need assistance with meal preparation?: No Do you need assistance with eating?: No Do you have difficulty maintaining continence: No Do you need assistance with getting out of bed/getting out of a chair/moving?: No Do you have difficulty managing or taking your medications?: Yes  Follow up appointments reviewed: PCP Follow-up appointment confirmed?: No (daughter to call and schedule appt) MD Provider Line Number:[phone] Given: No Specialist Hospital Follow-up appointment confirmed?: NA Do you need transportation to your follow-up appointment?: No Do you understand care options if your condition(s) worsen?: Yes-patient verbalized understanding    SIGNATURE Darrall Ellison, LPN [facility] Nurse Health Advisor Direct Dial [phone]

## 2023-07-31 NOTE — Telephone Encounter (Signed)
 Need to schedule TCM within 2 weeks of discharge from facility

## 2023-07-31 NOTE — Telephone Encounter (Signed)
 See my chart message

## 2023-08-01 DIAGNOSIS — I5033 Acute on chronic diastolic (congestive) heart failure: Secondary | ICD-10-CM | POA: Diagnosis not present

## 2023-08-01 DIAGNOSIS — I4891 Unspecified atrial fibrillation: Secondary | ICD-10-CM | POA: Diagnosis not present

## 2023-08-01 DIAGNOSIS — I110 Hypertensive heart disease with heart failure: Secondary | ICD-10-CM | POA: Diagnosis not present

## 2023-08-01 DIAGNOSIS — I10 Essential (primary) hypertension: Secondary | ICD-10-CM | POA: Diagnosis not present

## 2023-08-01 DIAGNOSIS — E871 Hypo-osmolality and hyponatremia: Secondary | ICD-10-CM | POA: Diagnosis not present

## 2023-08-01 DIAGNOSIS — E119 Type 2 diabetes mellitus without complications: Secondary | ICD-10-CM | POA: Diagnosis not present

## 2023-08-02 ENCOUNTER — Other Ambulatory Visit: Payer: Self-pay

## 2023-08-02 ENCOUNTER — Emergency Department
Admission: EM | Admit: 2023-08-02 | Discharge: 2023-08-02 | Disposition: A | Discharge status: Home / Self Care | Attending: Emergency Medicine | Admitting: Emergency Medicine

## 2023-08-02 ENCOUNTER — Telehealth: Payer: Self-pay

## 2023-08-02 ENCOUNTER — Emergency Department

## 2023-08-02 DIAGNOSIS — W19XXXA Unspecified fall, initial encounter: Secondary | ICD-10-CM | POA: Diagnosis not present

## 2023-08-02 DIAGNOSIS — E7800 Pure hypercholesterolemia, unspecified: Secondary | ICD-10-CM

## 2023-08-02 DIAGNOSIS — K449 Diaphragmatic hernia without obstruction or gangrene: Secondary | ICD-10-CM | POA: Diagnosis not present

## 2023-08-02 DIAGNOSIS — I6782 Cerebral ischemia: Secondary | ICD-10-CM | POA: Diagnosis not present

## 2023-08-02 DIAGNOSIS — Z7982 Long term (current) use of aspirin: Secondary | ICD-10-CM

## 2023-08-02 DIAGNOSIS — R296 Repeated falls: Secondary | ICD-10-CM

## 2023-08-02 DIAGNOSIS — I4891 Unspecified atrial fibrillation: Secondary | ICD-10-CM

## 2023-08-02 DIAGNOSIS — R0789 Other chest pain: Secondary | ICD-10-CM | POA: Insufficient documentation

## 2023-08-02 DIAGNOSIS — I5033 Acute on chronic diastolic (congestive) heart failure: Secondary | ICD-10-CM

## 2023-08-02 DIAGNOSIS — M542 Cervicalgia: Secondary | ICD-10-CM | POA: Diagnosis not present

## 2023-08-02 DIAGNOSIS — E871 Hypo-osmolality and hyponatremia: Secondary | ICD-10-CM

## 2023-08-02 DIAGNOSIS — Z6833 Body mass index (BMI) 33.0-33.9, adult: Secondary | ICD-10-CM

## 2023-08-02 DIAGNOSIS — I10 Essential (primary) hypertension: Secondary | ICD-10-CM | POA: Insufficient documentation

## 2023-08-02 DIAGNOSIS — E119 Type 2 diabetes mellitus without complications: Secondary | ICD-10-CM | POA: Insufficient documentation

## 2023-08-02 DIAGNOSIS — Y92019 Unspecified place in single-family (private) house as the place of occurrence of the external cause: Secondary | ICD-10-CM | POA: Diagnosis not present

## 2023-08-02 DIAGNOSIS — R519 Headache, unspecified: Secondary | ICD-10-CM | POA: Diagnosis not present

## 2023-08-02 DIAGNOSIS — E669 Obesity, unspecified: Secondary | ICD-10-CM

## 2023-08-02 DIAGNOSIS — S0990XA Unspecified injury of head, initial encounter: Secondary | ICD-10-CM | POA: Insufficient documentation

## 2023-08-02 DIAGNOSIS — R079 Chest pain, unspecified: Secondary | ICD-10-CM | POA: Diagnosis not present

## 2023-08-02 DIAGNOSIS — I110 Hypertensive heart disease with heart failure: Secondary | ICD-10-CM

## 2023-08-02 DIAGNOSIS — Z043 Encounter for examination and observation following other accident: Secondary | ICD-10-CM | POA: Diagnosis not present

## 2023-08-02 DIAGNOSIS — F419 Anxiety disorder, unspecified: Secondary | ICD-10-CM

## 2023-08-02 DIAGNOSIS — S199XXA Unspecified injury of neck, initial encounter: Secondary | ICD-10-CM | POA: Diagnosis not present

## 2023-08-02 NOTE — Telephone Encounter (Signed)
 Ok

## 2023-08-02 NOTE — Telephone Encounter (Signed)
Verbals given  

## 2023-08-02 NOTE — ED Provider Notes (Signed)
 [facility] Provider Note    Event Date/Time   First MD Initiated Contact with Patient 08/02/23 1017     (approximate)   History   Fall   HPI  Anita Walker is a 83 year old female with history of hypertension, diabetes, DVT/PE presenting to the emergency department for evaluation after a fall.  Patient reports she went to open the door to let her dog out when she lost her balance and fell and hit the back of her head.  She denies preceding chest pain, shortness of breath, lightheadedness.  Denies LOC.  She was not able to get up by herself, but had a medical alert bracelet that she activated.  Currently reports pain in her neck and right chest.  I reviewed her discharge summary from the [facility] system from 06/26/2023.  At that time patient presented with shortness of breath found to have bilateral pulmonary embolism with right heart strain.  She had not been on anticoagulation prior to this due to fall risk, but was placed on Eliquis with plans for indefinite anticoagulation.       Physical Exam   Triage Vital Signs: ED Triage Vitals  Encounter Vitals Group     BP      Systolic BP Percentile      Diastolic BP Percentile      Pulse      Resp      Temp      Temp src      SpO2      Weight      Height      Head Circumference      Peak Flow      Pain Score      Pain Loc      Pain Education      Exclude from Growth Chart     Most recent vital signs: Vitals:   08/02/23 1029  BP: 137/78  Pulse: 96  Resp: 17  Temp: 97.7 F (36.5 C)  SpO2: 95%    Nursing notes and vital signs reviewed.  General: Adult female, sitting upright in bed, awake interactive Head: Atraumatic Neck: Posterior neck pain noted Chest: Symmetric chest rise, tenderness to the right anterior chest wall Cardiac: Regular rhythm and rate.  Respiratory: Lung sounds coarse bilaterally Abdomen: Soft, nondistended. No tenderness to palpation.  Pelvis: Stable in AP and lateral  compression. No tenderness to palpation. MSK: No deformity to bilateral upper and lower extremity. Neuro: Alert, oriented. GCS 15. Skin: No evidence of burns or lacerations.  ED Results / Procedures / Treatments   Labs (all labs ordered are listed, but only abnormal results are displayed) Labs Reviewed - No data to display   EKG EKG independently reviewed and interpreted by myself demonstrates:  EKG demonstrates sinus rhythm at rate of 86, PR 192, QRS 96, QTc 47, no acute ST changes, PVC noted  Telemetry review: Regular, narrow complex tachycardia  RADIOLOGY Imaging independently reviewed and interpreted by myself demonstrates:  CT head without acute bleed CT c spine without acute fracture CXR without pneumothorax or visible rib fracture  Formal Radiology Read:  CT Head Wo Contrast Result Date: 08/02/2023 CLINICAL DATA:  Head trauma, minor (Age >= 65y); Neck trauma (Age >= 65y). Fall. Head and neck pain. On blood thinners. EXAM: CT HEAD WITHOUT CONTRAST CT CERVICAL SPINE WITHOUT CONTRAST TECHNIQUE: Multidetector CT imaging of the head and cervical spine was performed following the standard protocol without intravenous contrast. Multiplanar CT image reconstructions of the cervical  spine were also generated. RADIATION DOSE REDUCTION: This exam was performed according to the departmental dose-optimization program which includes automated exposure control, adjustment of the mA and/or kV according to patient size and/or use of iterative reconstruction technique. COMPARISON:  Cervical spine CT 04/15/2020. Head CT and MRI 03/23/2023. FINDINGS: CT HEAD FINDINGS Brain: There is no evidence of an acute infarct, intracranial hemorrhage, mass, midline shift, or extra-axial fluid collection. Patchy and confluent hypodensities in the cerebral white matter are unchanged and nonspecific but compatible with moderate to severe chronic small vessel ischemic disease. There is mild cerebral atrophy. Vascular:  Calcified atherosclerosis at the skull base. No hyperdense vessel. Skull: No fracture or suspicious lesion. Sinuses/Orbits: Minimal mucosal thickening or fluid in the right sphenoid sinus. Clear mastoid air cells. Bilateral cataract extraction. Other: None. CT CERVICAL SPINE FINDINGS Alignment: Chronic straightening of the normal cervical lordosis with trace anterolisthesis of C3 on C4 and C4 on C5. Skull base and vertebrae: No acute fracture or suspicious lesion. Chronic compression deformities of the C7, T1, and T2 vertebral bodies. Soft tissues and spinal canal: No prevertebral fluid or swelling. No visible canal hematoma. Disc levels: Multilevel disc degeneration with disc space height loss being most severe at C5-6. Widespread facet arthrosis, asymmetrically advanced on the left at C3-4 and C4-5. Facet ankylosis at C2-3. Moderate right neural foraminal stenosis at C5-6 due to uncovertebral spurring. Upper chest: Focal band-like opacity in the posterior left upper lobe suggestive of scarring or atelectasis. Other: None. IMPRESSION: 1. No evidence of acute intracranial abnormality or cervical spine fracture. 2. Moderate to severe chronic small vessel ischemic disease. Electronically Signed   By: Aundra Lee M.D.   On: 08/02/2023 12:05   CT Cervical Spine Wo Contrast Result Date: 08/02/2023 CLINICAL DATA:  Head trauma, minor (Age >= 65y); Neck trauma (Age >= 65y). Fall. Head and neck pain. On blood thinners. EXAM: CT HEAD WITHOUT CONTRAST CT CERVICAL SPINE WITHOUT CONTRAST TECHNIQUE: Multidetector CT imaging of the head and cervical spine was performed following the standard protocol without intravenous contrast. Multiplanar CT image reconstructions of the cervical spine were also generated. RADIATION DOSE REDUCTION: This exam was performed according to the departmental dose-optimization program which includes automated exposure control, adjustment of the mA and/or kV according to patient size and/or use of  iterative reconstruction technique. COMPARISON:  Cervical spine CT 04/15/2020. Head CT and MRI 03/23/2023. FINDINGS: CT HEAD FINDINGS Brain: There is no evidence of an acute infarct, intracranial hemorrhage, mass, midline shift, or extra-axial fluid collection. Patchy and confluent hypodensities in the cerebral white matter are unchanged and nonspecific but compatible with moderate to severe chronic small vessel ischemic disease. There is mild cerebral atrophy. Vascular: Calcified atherosclerosis at the skull base. No hyperdense vessel. Skull: No fracture or suspicious lesion. Sinuses/Orbits: Minimal mucosal thickening or fluid in the right sphenoid sinus. Clear mastoid air cells. Bilateral cataract extraction. Other: None. CT CERVICAL SPINE FINDINGS Alignment: Chronic straightening of the normal cervical lordosis with trace anterolisthesis of C3 on C4 and C4 on C5. Skull base and vertebrae: No acute fracture or suspicious lesion. Chronic compression deformities of the C7, T1, and T2 vertebral bodies. Soft tissues and spinal canal: No prevertebral fluid or swelling. No visible canal hematoma. Disc levels: Multilevel disc degeneration with disc space height loss being most severe at C5-6. Widespread facet arthrosis, asymmetrically advanced on the left at C3-4 and C4-5. Facet ankylosis at C2-3. Moderate right neural foraminal stenosis at C5-6 due to uncovertebral spurring. Upper chest: Focal band-like opacity  in the posterior left upper lobe suggestive of scarring or atelectasis. Other: None. IMPRESSION: 1. No evidence of acute intracranial abnormality or cervical spine fracture. 2. Moderate to severe chronic small vessel ischemic disease. Electronically Signed   By: Aundra Lee M.D.   On: 08/02/2023 12:05   DG Chest 2 View Result Date: 08/02/2023 CLINICAL DATA:  Fall EXAM: CHEST - 2 VIEW COMPARISON:  Chest x-Laketha Leopard performed March 23, 2023 FINDINGS: No pneumothorax or pleural effusion. Mild interstitial  prominence. Heart mediastinum are not significantly changed. Hiatal hernia. Degenerative changes in the imaged osseous structures with underlying changes of osteopenia. IMPRESSION: 1. No pneumothorax. 2. Hiatal hernia. Electronically Signed   By: Reagan Camera M.D.   On: 08/02/2023 11:40    PROCEDURES:  Critical Care performed: No  Procedures   MEDICATIONS ORDERED IN ED: Medications - No data to display   IMPRESSION / MDM / ASSESSMENT AND PLAN / ED COURSE  I reviewed the triage vital signs and the nursing notes.  Differential diagnosis includes, but is not limited to, intracranial bleed, skull fracture, spine fracture, rib fracture, pneumothorax, no evidence of abdominal or extremity trauma on exam  Patient's presentation is most consistent with acute presentation with potential threat to life or bodily function.  83 year old female presenting after a fall with head trauma on anticoagulation.  CT head, C-spine, chest x-Field Staniszewski ordered.  Currently declining pain control.  12:21 PM Imaging fortunately without significant traumatic injury.  Patient reassessed and updated on results of workup.  Both patient and family are comfortable with discharge home.  Strict return precautions provided.  Patient discharged in stable condition.      FINAL CLINICAL IMPRESSION(S) / ED DIAGNOSES   Final diagnoses:  Closed head injury, initial encounter  Neck pain  Right-sided chest wall pain  Fall in home, initial encounter     Rx / DC Orders   ED Discharge Orders     None        Note:  This document was prepared using Dragon voice recognition software and may include unintentional dictation errors.   Claria Crofts, MD 08/02/23 [phone]

## 2023-08-02 NOTE — Telephone Encounter (Signed)
 Ok for verbal orders ?

## 2023-08-02 NOTE — Telephone Encounter (Signed)
 Copied from CRM [id number]. Topic: Clinical - Home Health Verbal Orders >> Aug 02, 2023  1:19 PM Alyse July wrote: Caller/Agency: [facility] Callback Number: [phone] opt 2  Service Requested: Skilled Nursing Frequency: once a week for 2 weeks  Any new concerns about the patient? No

## 2023-08-02 NOTE — ED Triage Notes (Signed)
 Pt was letting her sitter in the door and went to turn around and fell. Pt is complaining of head and neck pain. Pt is on blood thinners.

## 2023-08-02 NOTE — Discharge Instructions (Signed)
 Your testing today fortunately did not show serious injuries.  Follow-up with your primary care doctor for further evaluation.  Return to the ER for new or worsening symptoms.

## 2023-08-03 NOTE — Patient Outreach (Addendum)
 Complex Care Management   Visit Note  08/03/2023  Name:  Anita Walker MRN: [id number] DOB: 07/25/1940  Situation: No call to patient. Upon chart review prior to scheduled call patient found to be in  ED                                                                                                                                                                                                                                                                                                                                                                                                                                                                                                                      Background:   Past Medical History:  Diagnosis Date   Anemia    Arthritis    Atrial fibrillation (HCC)    Breast cancer (HCC)    s/p lumpectomy 1992.  s/p chemo and xrt left breast   Diabetes mellitus (HCC)    Edema    feet/legs   Gastric ulcer  GERD (gastroesophageal reflux disease)    HOH (hard of hearing)    aides   Hypercholesterolemia    Hypertension    Personal history of chemotherapy    Pulmonary emboli (HCC)       Follow Up Plan:   Telephone follow-up in 1 month with RN case manager   Verba Girt RN, BSN, CCM [place]  [facility], Population Health Case Manager Phone: [phone]

## 2023-08-06 DIAGNOSIS — I4891 Unspecified atrial fibrillation: Secondary | ICD-10-CM | POA: Diagnosis not present

## 2023-08-06 DIAGNOSIS — E871 Hypo-osmolality and hyponatremia: Secondary | ICD-10-CM | POA: Diagnosis not present

## 2023-08-06 DIAGNOSIS — I110 Hypertensive heart disease with heart failure: Secondary | ICD-10-CM | POA: Diagnosis not present

## 2023-08-06 DIAGNOSIS — I10 Essential (primary) hypertension: Secondary | ICD-10-CM | POA: Diagnosis not present

## 2023-08-06 DIAGNOSIS — E119 Type 2 diabetes mellitus without complications: Secondary | ICD-10-CM | POA: Diagnosis not present

## 2023-08-06 DIAGNOSIS — I5033 Acute on chronic diastolic (congestive) heart failure: Secondary | ICD-10-CM | POA: Diagnosis not present

## 2023-08-06 NOTE — Telephone Encounter (Signed)
 FYI for her appt this week.

## 2023-08-08 ENCOUNTER — Ambulatory Visit: Admitting: Internal Medicine

## 2023-08-08 VITALS — BP 116/74 | HR 94 | Temp 97.9°F | Resp 16 | Ht 67.0 in | Wt 225.0 lb

## 2023-08-08 DIAGNOSIS — R600 Localized edema: Secondary | ICD-10-CM

## 2023-08-08 DIAGNOSIS — R9389 Abnormal findings on diagnostic imaging of other specified body structures: Secondary | ICD-10-CM | POA: Diagnosis not present

## 2023-08-08 DIAGNOSIS — E7800 Pure hypercholesterolemia, unspecified: Secondary | ICD-10-CM

## 2023-08-08 DIAGNOSIS — G319 Degenerative disease of nervous system, unspecified: Secondary | ICD-10-CM

## 2023-08-08 DIAGNOSIS — K769 Liver disease, unspecified: Secondary | ICD-10-CM

## 2023-08-08 DIAGNOSIS — I10 Essential (primary) hypertension: Secondary | ICD-10-CM | POA: Diagnosis not present

## 2023-08-08 DIAGNOSIS — E041 Nontoxic single thyroid nodule: Secondary | ICD-10-CM | POA: Diagnosis not present

## 2023-08-08 DIAGNOSIS — Z9181 History of falling: Secondary | ICD-10-CM

## 2023-08-08 DIAGNOSIS — E1149 Type 2 diabetes mellitus with other diabetic neurological complication: Secondary | ICD-10-CM | POA: Diagnosis not present

## 2023-08-08 DIAGNOSIS — R531 Weakness: Secondary | ICD-10-CM | POA: Diagnosis not present

## 2023-08-08 DIAGNOSIS — F419 Anxiety disorder, unspecified: Secondary | ICD-10-CM

## 2023-08-08 DIAGNOSIS — E1142 Type 2 diabetes mellitus with diabetic polyneuropathy: Secondary | ICD-10-CM | POA: Diagnosis not present

## 2023-08-08 MED ORDER — BUSPIRONE HCL 5 MG PO TABS: 5.0000 mg | ORAL_TABLET | Freq: Every day | ORAL | 1 refills | Status: DC | PRN

## 2023-08-08 NOTE — Progress Notes (Addendum)
 Subjective:    [name], female    DOB: Jul 07, 1940, 83 y.o.   MRN: [id number]  Patient here for  Chief Complaint  Patient presents with   Hospitalization Follow-up    HPI Here for a hospital follow up.  she is accompanied by her daughter and her caretaker. Admitted 06/26/23 - after presenting with four days of dyspnea and found to have hypoxic respiratory failure and bilateral PE with right heart strain and DVT left common femoral vein. TTE (4/23) LVEF >55%, RA/RV mildly dilated with normal systolic function, mitral annular calcification and moderate aortic sclerosis. Likely etiology of PE and DVT is immobility complicated by a stress fracture. Elected medication management. Placed on eliquis. Continued on oxygen. Found to have indeterminate 2.4cm right hepatic lobe lesion found on CTA chest 4/22. Recommended MRI to better characterize. Recommended to hold losartan . Was placed on eliquis and continues on eliquis. She was discharged to SNF. Received PT. Discharged home - 07/28/23. 08/02/23 - ED evaluation - after falling. Lost balance, fell and hit the back of her head. Denies LOC. Activated medic alert. In had CT c-spine and head - no acute intracranial abnormality or c-spine fracture. Moderate to severe chronic small vessel ischemic disease. Reports no residual pain from the fall. Persistent problems with lower extremity edema. Sits and sleeps recliner. Daughter feels she needs something to help with increased anxiety. Discussed treatment options.    Past Medical History:  Diagnosis Date   Anemia    Arthritis    Atrial fibrillation (HCC)    Breast cancer (HCC)    s/p lumpectomy 1992.  s/p chemo and xrt left breast   Diabetes mellitus (HCC)    Edema    feet/legs   Gastric ulcer    GERD (gastroesophageal reflux disease)    HOH (hard of hearing)    aides   Hypercholesterolemia    Hypertension    Personal history of chemotherapy    Pulmonary emboli [facility])    Past  Surgical History:  Procedure Laterality Date   BREAST LUMPECTOMY  03/06/1990   left breast   CATARACT EXTRACTION W/PHACO Right 12/26/2016   Procedure: CATARACT EXTRACTION PHACO AND INTRAOCULAR LENS PLACEMENT (IOC)-RIGHT DIABETIC;  Surgeon: Clair Crews, MD;  Location: [facility] ORS;  Service: Ophthalmology;  Laterality: Right;  US  00:47 AP% 24.5 CDE 11.62 Fluid pack lot # [id number]   CATARACT EXTRACTION W/PHACO Left 01/23/2017   Procedure: CATARACT EXTRACTION PHACO AND INTRAOCULAR LENS PLACEMENT (IOC);  Surgeon: Clair Crews, MD;  Location: [facility] ORS;  Service: Ophthalmology;  Laterality: Left;  US  00:50 AP% 16.1 CDE 8.18 Fluid pack lot #[id number]   COLONOSCOPY WITH PROPOFOL  N/A 07/08/2019   Procedure: COLONOSCOPY WITH PROPOFOL ;  Surgeon: Marnee Sink, MD;  Location: [facility] ENDOSCOPY;  Service: Endoscopy;  Laterality: N/A;   Family History  Problem Relation Age of Onset   Heart disease Father    Heart disease Brother        s/p CABG   Breast cancer Daughter    Colon cancer Neg Hx    Social History   Socioeconomic History   Marital status: Widowed    Spouse name: Not on file   Number of children: Not on file   Years of education: Not on file   Highest education level: 10th grade  Occupational History   Not on file  Tobacco Use   Smoking status: Never   Smokeless tobacco: Never  Vaping Use   Vaping status: Never Used  Substance and Sexual Activity  Alcohol use: No    Alcohol/week: 0.0 standard drinks of alcohol   Drug use: No   Sexual activity: Never  Other Topics Concern   Not on file  Social History Narrative   Not on file   Social Drivers of Health   Financial Resource Strain: Low Risk  (06/25/2023)   Overall Financial Resource Strain (CARDIA)    Difficulty of Paying Living Expenses: Not hard at all  Food Insecurity: No Food Insecurity (06/25/2023)   Hunger Vital Sign    Worried About Running Out of Food in the Last Year: Never true    Ran Out of Food in the Last  Year: Never true  Transportation Needs: No Transportation Needs (06/25/2023)   PRAPARE - Administrator, Civil Service (Medical): No    Lack of Transportation (Non-Medical): No  Physical Activity: Unknown (06/25/2023)   Exercise Vital Sign    Days of Exercise per Week: 0 days    Minutes of Exercise per Session: Not on file  Stress: No Stress Concern Present (06/25/2023)   [employer] of Occupational Health - Occupational Stress Questionnaire    Feeling of Stress : Not at all  Social Connections: Socially Isolated (06/25/2023)   Social Connection and Isolation Panel [NHANES]    Frequency of Communication with Friends and Family: Never    Frequency of Social Gatherings with Friends and Family: Never    Attends Religious Services: Never    Database administrator or Organizations: No    Attends Banker Meetings: Never    Marital Status: Widowed     Review of Systems  Constitutional:  Negative for appetite change and unexpected weight change.  HENT:  Negative for congestion and sinus pressure.   Respiratory:  Negative for cough and chest tightness.        Breathing stable.   Cardiovascular:  Negative for chest pain and palpitations.       Chronic lower extremity swelling.   Gastrointestinal:  Negative for abdominal pain, nausea and vomiting.  Musculoskeletal:  Negative for joint swelling and myalgias.  Skin:  Negative for wound.       Venostasis changes - lower extremities.   Neurological:  Negative for dizziness and headaches.  Psychiatric/Behavioral:  Negative for dysphoric mood.        Increased stress/anxiety.        Objective:     BP 116/74   Pulse 94   Temp 97.9 F (36.6 C)   Resp 16   Ht 5\' 7"  (1.702 m)   Wt 225 lb (102.1 kg)   SpO2 98%   BMI 35.24 kg/m  Wt Readings from Last 3 Encounters:  08/08/23 225 lb (102.1 kg)  08/02/23 227 lb (103 kg)  07/20/23 227 lb (103 kg)    Physical Exam Vitals reviewed.  Constitutional:       General: She is not in acute distress.    Appearance: Normal appearance.  HENT:     Head: Normocephalic and atraumatic.     Right Ear: External ear normal.     Left Ear: External ear normal.  Eyes:     General: No scleral icterus.       Right eye: No discharge.        Left eye: No discharge.     Conjunctiva/sclera: Conjunctivae normal.  Neck:     Thyroid : No thyromegaly.  Cardiovascular:     Rate and Rhythm: Normal rate and regular rhythm.  Pulmonary:     Effort:  No respiratory distress.     Breath sounds: Normal breath sounds. No wheezing.  Abdominal:     General: Bowel sounds are normal.     Palpations: Abdomen is soft.     Tenderness: There is no abdominal tenderness.  Musculoskeletal:     Cervical back: Neck supple. No tenderness.     Comments: Increased pedal and lower extremity swelling (chronic).   Lymphadenopathy:     Cervical: No cervical adenopathy.  Skin:    Findings: No erythema or rash.  Neurological:     Mental Status: She is alert.  Psychiatric:        Mood and Affect: Mood normal.        Behavior: Behavior normal.         Outpatient Encounter Medications as of 08/08/2023  Medication Sig   busPIRone  (BUSPAR ) 5 MG tablet Take 1 tablet (5 mg total) by mouth daily as needed.   acetaminophen  (TYLENOL ) 325 MG tablet Take by mouth.   alum & mag hydroxide-simeth (MAALOX/MYLANTA) 200-200-20 MG/5ML suspension Take 15 mLs by mouth every 6 (six) hours as needed for indigestion or heartburn.   apixaban (ELIQUIS) 5 MG TABS tablet Take 5 mg by mouth 2 (two) times daily.   diphenhydrAMINE  (BENADRYL ) 25 mg capsule Take 25 mg at bedtime as needed by mouth for sleep.    Docusate Calcium (STOOL SOFTENER PO) Take by mouth.   escitalopram (LEXAPRO) 10 MG tablet Take 10 mg by mouth daily.   feeding supplement (ENSURE ENLIVE / ENSURE PLUS) LIQD Take 237 mLs by mouth 2 (two) times daily between meals.   fluticasone  (FLONASE ) 50 MCG/ACT nasal spray Place 2 sprays into both  nostrils daily.   glucose blood (CONTOUR TEST) test strip USE TO TEST BLOOD SUGAR ONCE DAILY   losartan  (COZAAR ) 50 MG tablet Take 50 mg by mouth daily.   Multiple Vitamin (MULTIVITAMIN WITH MINERALS) TABS tablet Take 1 tablet daily by mouth.   ondansetron  (ZOFRAN -ODT) 4 MG disintegrating tablet Take 1 tablet (4 mg total) by mouth every 8 (eight) hours as needed for nausea or vomiting.   pantoprazole  (PROTONIX ) 40 MG tablet TAKE 1 TABLET(40 MG) BY MOUTH DAILY   torsemide (DEMADEX) 20 MG tablet Take 20 mg by mouth daily.   [DISCONTINUED] aspirin  EC 81 MG tablet Take 1 tablet (81 mg total) by mouth daily. Swallow whole.   [DISCONTINUED] metoprolol tartrate (LOPRESSOR) 25 MG tablet Take 25 mg by mouth daily.   No facility-administered encounter medications on file as of 08/08/2023.    Medications reconciled with pt/pts family.   Lab Results  Component Value Date   WBC 6.7 08/08/2023   HGB 12.9 08/08/2023   HCT 38.6 08/08/2023   PLT 267.0 08/08/2023   GLUCOSE 104 (H) 08/08/2023   CHOL 155 03/19/2023   TRIG 62.0 03/19/2023   HDL 89.70 03/19/2023   LDLCALC 53 03/19/2023   ALT 19 03/19/2023   AST 19 03/19/2023   NA 136 08/08/2023   K 3.9 08/08/2023   CL 93 (L) 08/08/2023   CREATININE 0.91 08/08/2023   BUN 16 08/08/2023   CO2 25 08/08/2023   TSH 2.38 08/08/2023   INR 1.2 (H) 07/22/2020   HGBA1C 6.0 03/19/2023   MICROALBUR 1.6 03/19/2023    CT Head Wo Contrast Result Date: 08/02/2023 CLINICAL DATA:  Head trauma, minor (Age >= 65y); Neck trauma (Age >= 65y). Fall. Head and neck pain. On blood thinners. EXAM: CT HEAD WITHOUT CONTRAST CT CERVICAL SPINE WITHOUT CONTRAST TECHNIQUE: Multidetector CT imaging  of the head and cervical spine was performed following the standard protocol without intravenous contrast. Multiplanar CT image reconstructions of the cervical spine were also generated. RADIATION DOSE REDUCTION: This exam was performed according to the departmental dose-optimization  program which includes automated exposure control, adjustment of the mA and/or kV according to patient size and/or use of iterative reconstruction technique. COMPARISON:  Cervical spine CT 04/15/2020. Head CT and MRI 03/23/2023. FINDINGS: CT HEAD FINDINGS Brain: There is no evidence of an acute infarct, intracranial hemorrhage, mass, midline shift, or extra-axial fluid collection. Patchy and confluent hypodensities in the cerebral white matter are unchanged and nonspecific but compatible with moderate to severe chronic small vessel ischemic disease. There is mild cerebral atrophy. Vascular: Calcified atherosclerosis at the skull base. No hyperdense vessel. Skull: No fracture or suspicious lesion. Sinuses/Orbits: Minimal mucosal thickening or fluid in the right sphenoid sinus. Clear mastoid air cells. Bilateral cataract extraction. Other: None. CT CERVICAL SPINE FINDINGS Alignment: Chronic straightening of the normal cervical lordosis with trace anterolisthesis of C3 on C4 and C4 on C5. Skull base and vertebrae: No acute fracture or suspicious lesion. Chronic compression deformities of the C7, T1, and T2 vertebral bodies. Soft tissues and spinal canal: No prevertebral fluid or swelling. No visible canal hematoma. Disc levels: Multilevel disc degeneration with disc space height loss being most severe at C5-6. Widespread facet arthrosis, asymmetrically advanced on the left at C3-4 and C4-5. Facet ankylosis at C2-3. Moderate right neural foraminal stenosis at C5-6 due to uncovertebral spurring. Upper chest: Focal band-like opacity in the posterior left upper lobe suggestive of scarring or atelectasis. Other: None. IMPRESSION: 1. No evidence of acute intracranial abnormality or cervical spine fracture. 2. Moderate to severe chronic small vessel ischemic disease. Electronically Signed   By: Aundra Lee M.D.   On: 08/02/2023 12:05   CT Cervical Spine Wo Contrast Result Date: 08/02/2023 CLINICAL DATA:  Head trauma,  minor (Age >= 65y); Neck trauma (Age >= 65y). Fall. Head and neck pain. On blood thinners. EXAM: CT HEAD WITHOUT CONTRAST CT CERVICAL SPINE WITHOUT CONTRAST TECHNIQUE: Multidetector CT imaging of the head and cervical spine was performed following the standard protocol without intravenous contrast. Multiplanar CT image reconstructions of the cervical spine were also generated. RADIATION DOSE REDUCTION: This exam was performed according to the departmental dose-optimization program which includes automated exposure control, adjustment of the mA and/or kV according to patient size and/or use of iterative reconstruction technique. COMPARISON:  Cervical spine CT 04/15/2020. Head CT and MRI 03/23/2023. FINDINGS: CT HEAD FINDINGS Brain: There is no evidence of an acute infarct, intracranial hemorrhage, mass, midline shift, or extra-axial fluid collection. Patchy and confluent hypodensities in the cerebral white matter are unchanged and nonspecific but compatible with moderate to severe chronic small vessel ischemic disease. There is mild cerebral atrophy. Vascular: Calcified atherosclerosis at the skull base. No hyperdense vessel. Skull: No fracture or suspicious lesion. Sinuses/Orbits: Minimal mucosal thickening or fluid in the right sphenoid sinus. Clear mastoid air cells. Bilateral cataract extraction. Other: None. CT CERVICAL SPINE FINDINGS Alignment: Chronic straightening of the normal cervical lordosis with trace anterolisthesis of C3 on C4 and C4 on C5. Skull base and vertebrae: No acute fracture or suspicious lesion. Chronic compression deformities of the C7, T1, and T2 vertebral bodies. Soft tissues and spinal canal: No prevertebral fluid or swelling. No visible canal hematoma. Disc levels: Multilevel disc degeneration with disc space height loss being most severe at C5-6. Widespread facet arthrosis, asymmetrically advanced on the left at C3-4  and C4-5. Facet ankylosis at C2-3. Moderate right neural foraminal  stenosis at C5-6 due to uncovertebral spurring. Upper chest: Focal band-like opacity in the posterior left upper lobe suggestive of scarring or atelectasis. Other: None. IMPRESSION: 1. No evidence of acute intracranial abnormality or cervical spine fracture. 2. Moderate to severe chronic small vessel ischemic disease. Electronically Signed   By: Aundra Lee M.D.   On: 08/02/2023 12:05   DG Chest 2 View Result Date: 08/02/2023 CLINICAL DATA:  Fall EXAM: CHEST - 2 VIEW COMPARISON:  Chest x-ray performed March 23, 2023 FINDINGS: No pneumothorax or pleural effusion. Mild interstitial prominence. Heart mediastinum are not significantly changed. Hiatal hernia. Degenerative changes in the imaged osseous structures with underlying changes of osteopenia. IMPRESSION: 1. No pneumothorax. 2. Hiatal hernia. Electronically Signed   By: Reagan Camera M.D.   On: 08/02/2023 11:40       Assessment & Plan:  Primary hypertension Assessment & Plan: Continue losartan  and amlodipine .  Follow pressures. Follow metabolic panel.  Lab Results  Component Value Date   CREATININE 0.91 08/08/2023      Hypercholesteremia Assessment & Plan: On simvastatin .  Low cholesterol diet and exercise.  Follow lipid panel and liver function tests.  No change today.  Lab Results  Component Value Date   CHOL 155 03/19/2023   HDL 89.70 03/19/2023   LDLCALC 53 03/19/2023   TRIG 62.0 03/19/2023   CHOLHDL 2 03/19/2023     Orders: -     CBC with Differential/Platelet -     Basic metabolic panel with GFR -     TSH  Abnormal chest CT Assessment & Plan: CT 06/2023 -  Examination positive for extensive bilateral pulmonary emboli with enlarged pulmonary artery suggesting pulmonary hypertension but no CT signs of right heart strain.  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  Large hiatal hernia. Continues on eliquis. Breathing stable.    Anxiety Assessment & Plan: Off buspar . Will restart. Follow. Adjust  medication as needed.    At high risk for falls Assessment & Plan: Discussed importance of working with PT - strengthening exercise. Continue eliquis. Use walking. Will follow.    Bilateral leg edema Assessment & Plan: Has previously seen [facility]. Previous wrapping. Discussed need for f/u.    Cerebral atrophy [facility]) Assessment & Plan: CT scan as outlined.  Plan appt with neurology.   Orders: -     Ambulatory referral to Neurology  Diabetic polyneuropathy associated with type 2 diabetes mellitus (HCC) Assessment & Plan: Sugars have been controlled.  Continue low carb diet and exercise.  Follow met b and A1c. No changes today.  Lab Results  Component Value Date   HGBA1C 6.0 03/19/2023      Essential hypertension Assessment & Plan: Continue losartan . Follow pressures.    Liver lesion Assessment & Plan: Found during recent CT - during hospitalization. Plan for f/u MRI abdomen. Will see if we can get her stronger and plan f/u with [facility] and neurology as outlined.    Pure hypercholesterolemia Assessment & Plan: Low cholesterol diet. Exercise as tolerated. Follow lipid panel. Lab Results  Component Value Date   CHOL 155 03/19/2023   HDL 89.70 03/19/2023   LDLCALC 53 03/19/2023   TRIG 62.0 03/19/2023   CHOLHDL 2 03/19/2023      Thyroid  nodule Assessment & Plan: Noticed on scan 03/2023. Has thyroid  ultrasound next week.    Type 2 diabetes mellitus with neurological complications (HCC) Assessment & Plan: Low carb diet and exercise as tolerated.  Follow met b and a1c. No changes today.  Lab Results  Component Value Date   HGBA1C 6.0 03/19/2023      Weakness Assessment & Plan: Home health PT. Discussed need for PT and exercises.    Other orders -     busPIRone  HCl; Take 1 tablet (5 mg total) by mouth daily as needed.  Dispense: 30 tablet; Refill: 1     Dellar Fenton, MD

## 2023-08-09 ENCOUNTER — Telehealth: Payer: Self-pay

## 2023-08-09 ENCOUNTER — Other Ambulatory Visit: Payer: Self-pay | Admitting: Internal Medicine

## 2023-08-09 LAB — CBC WITH DIFFERENTIAL/PLATELET
Basophils Absolute: 0.1 10*3/uL (ref 0.0–0.1)
Basophils Relative: 1 % (ref 0.0–3.0)
Eosinophils Absolute: 0.2 10*3/uL (ref 0.0–0.7)
Eosinophils Relative: 2.4 % (ref 0.0–5.0)
HCT: 38.6 % (ref 36.0–46.0)
Hemoglobin: 12.9 g/dL (ref 12.0–15.0)
Lymphocytes Relative: 26 % (ref 12.0–46.0)
Lymphs Abs: 1.7 10*3/uL (ref 0.7–4.0)
MCHC: 33.5 g/dL (ref 30.0–36.0)
MCV: 90.5 fl (ref 78.0–100.0)
Monocytes Absolute: 0.9 10*3/uL (ref 0.1–1.0)
Monocytes Relative: 13.9 % — ABNORMAL HIGH (ref 3.0–12.0)
Neutro Abs: 3.8 10*3/uL (ref 1.4–7.7)
Neutrophils Relative %: 56.7 % (ref 43.0–77.0)
Platelets: 267 10*3/uL (ref 150.0–400.0)
RBC: 4.26 Mil/uL (ref 3.87–5.11)
RDW: 15.8 % — ABNORMAL HIGH (ref 11.5–15.5)
WBC: 6.7 10*3/uL (ref 4.0–10.5)

## 2023-08-09 LAB — BASIC METABOLIC PANEL WITH GFR
BUN: 16 mg/dL (ref 6–23)
CO2: 25 meq/L (ref 19–32)
Calcium: 9.5 mg/dL (ref 8.4–10.5)
Chloride: 93 meq/L — ABNORMAL LOW (ref 96–112)
Creatinine, Ser: 0.91 mg/dL (ref 0.40–1.20)
GFR: 57.58 mL/min — ABNORMAL LOW (ref >60.00)
Glucose, Bld: 104 mg/dL — ABNORMAL HIGH (ref 70–99)
Potassium: 3.9 meq/L (ref 3.5–5.1)
Sodium: 136 meq/L (ref 135–145)

## 2023-08-09 LAB — TSH: TSH: 2.38 u[IU]/mL (ref 0.35–5.50)

## 2023-08-09 NOTE — Telephone Encounter (Signed)
 Please refuse. Patient is not taking.

## 2023-08-09 NOTE — Patient Outreach (Unsigned)
 Complex Care Management   Visit Note  08/09/2023  Name:  Anita Walker MRN: [id number] DOB: 1940-03-29  Situation: Referral received for Complex Care Management related to Heart Failure I obtained verbal consent from Patient.  Visit completed with patient  on the phone  Background:   Past Medical History:  Diagnosis Date   Anemia    Arthritis    Atrial fibrillation (HCC)    Breast cancer (HCC)    s/p lumpectomy 1992.  s/p chemo and xrt left breast   Diabetes mellitus (HCC)    Edema    feet/legs   Gastric ulcer    GERD (gastroesophageal reflux disease)    HOH (hard of hearing)    aides   Hypercholesterolemia    Hypertension    Personal history of chemotherapy    Pulmonary emboli (HCC)     Assessment: Patient Reported Symptoms:  Cognitive Cognitive Status: Alert and oriented to person, place, and time, Insightful and able to interpret abstract concepts, Normal speech and language skills      Neurological      HEENT HEENT Symptoms Reported: Other: Other HEENT Symptoms/Conditions: Hard of hearing      Cardiovascular      Respiratory      Endocrine      Gastrointestinal        Genitourinary      Integumentary      Musculoskeletal          Psychosocial              06/25/2023    2:51 PM  Depression screen PHQ 2/9  Decreased Interest 3  Down, Depressed, Hopeless 1  PHQ - 2 Score 4  Altered sleeping 2  Tired, decreased energy 1  Change in appetite 0  Feeling bad or failure about yourself  0  Trouble concentrating 2  Moving slowly or fidgety/restless 0  Suicidal thoughts 0  PHQ-9 Score 9  Difficult doing work/chores Somewhat difficult    There were no vitals filed for this visit.  Medications Reviewed Today   Medications were not reviewed in this encounter     Recommendation:   {RECOMMENDATONS:32554}  Follow Up Plan:   {FOLLOWUP:32559}  SIG ***

## 2023-08-10 ENCOUNTER — Telehealth: Payer: Self-pay | Admitting: Internal Medicine

## 2023-08-10 ENCOUNTER — Ambulatory Visit: Payer: Self-pay | Admitting: Internal Medicine

## 2023-08-10 ENCOUNTER — Encounter: Payer: Self-pay | Admitting: Internal Medicine

## 2023-08-10 NOTE — Telephone Encounter (Signed)
 Copied from CRM [id number]. Topic: Clinical - Home Health Verbal Orders >> Aug 10, 2023 10:36 AM Elita Guitar wrote: Caller/Agency: Deedra Farr [facility] Callback Number: [phone] option 2 Service Requested: Skilled Nursing Frequency: 1 week 6  Any new concerns about the patient? No

## 2023-08-10 NOTE — Telephone Encounter (Signed)
 Ok for [employer]

## 2023-08-10 NOTE — Telephone Encounter (Signed)
 Ok

## 2023-08-11 DIAGNOSIS — I2699 Other pulmonary embolism without acute cor pulmonale: Secondary | ICD-10-CM | POA: Diagnosis not present

## 2023-08-11 DIAGNOSIS — I110 Hypertensive heart disease with heart failure: Secondary | ICD-10-CM | POA: Diagnosis not present

## 2023-08-11 DIAGNOSIS — R296 Repeated falls: Secondary | ICD-10-CM | POA: Diagnosis not present

## 2023-08-11 DIAGNOSIS — I82403 Acute embolism and thrombosis of unspecified deep veins of lower extremity, bilateral: Secondary | ICD-10-CM | POA: Diagnosis not present

## 2023-08-11 DIAGNOSIS — S32038D Other fracture of third lumbar vertebra, subsequent encounter for fracture with routine healing: Secondary | ICD-10-CM | POA: Diagnosis not present

## 2023-08-11 DIAGNOSIS — N390 Urinary tract infection, site not specified: Secondary | ICD-10-CM | POA: Diagnosis not present

## 2023-08-11 DIAGNOSIS — G8921 Chronic pain due to trauma: Secondary | ICD-10-CM | POA: Diagnosis not present

## 2023-08-11 DIAGNOSIS — E669 Obesity, unspecified: Secondary | ICD-10-CM | POA: Diagnosis not present

## 2023-08-11 DIAGNOSIS — Z6833 Body mass index (BMI) 33.0-33.9, adult: Secondary | ICD-10-CM | POA: Diagnosis not present

## 2023-08-11 DIAGNOSIS — G4700 Insomnia, unspecified: Secondary | ICD-10-CM | POA: Diagnosis not present

## 2023-08-11 DIAGNOSIS — I5033 Acute on chronic diastolic (congestive) heart failure: Secondary | ICD-10-CM | POA: Diagnosis not present

## 2023-08-11 DIAGNOSIS — E1142 Type 2 diabetes mellitus with diabetic polyneuropathy: Secondary | ICD-10-CM | POA: Diagnosis not present

## 2023-08-11 DIAGNOSIS — Z7901 Long term (current) use of anticoagulants: Secondary | ICD-10-CM | POA: Diagnosis not present

## 2023-08-11 DIAGNOSIS — Z9181 History of falling: Secondary | ICD-10-CM | POA: Diagnosis not present

## 2023-08-11 DIAGNOSIS — E7800 Pure hypercholesterolemia, unspecified: Secondary | ICD-10-CM | POA: Diagnosis not present

## 2023-08-11 DIAGNOSIS — J9601 Acute respiratory failure with hypoxia: Secondary | ICD-10-CM | POA: Diagnosis not present

## 2023-08-11 DIAGNOSIS — F419 Anxiety disorder, unspecified: Secondary | ICD-10-CM | POA: Diagnosis not present

## 2023-08-11 DIAGNOSIS — M51369 Other intervertebral disc degeneration, lumbar region without mention of lumbar back pain or lower extremity pain: Secondary | ICD-10-CM | POA: Diagnosis not present

## 2023-08-11 DIAGNOSIS — Z556 Problems related to health literacy: Secondary | ICD-10-CM | POA: Diagnosis not present

## 2023-08-11 DIAGNOSIS — G4733 Obstructive sleep apnea (adult) (pediatric): Secondary | ICD-10-CM | POA: Diagnosis not present

## 2023-08-11 DIAGNOSIS — I4891 Unspecified atrial fibrillation: Secondary | ICD-10-CM | POA: Diagnosis not present

## 2023-08-12 ENCOUNTER — Encounter: Payer: Self-pay | Admitting: Internal Medicine

## 2023-08-12 NOTE — Assessment & Plan Note (Signed)
 Low carb diet and exercise as tolerated.  Follow met b and a1c. No changes today.  Lab Results  Component Value Date   HGBA1C 6.0 03/19/2023

## 2023-08-12 NOTE — Assessment & Plan Note (Signed)
 Off buspar . Will restart. Follow. Adjust medication as needed.

## 2023-08-12 NOTE — Assessment & Plan Note (Signed)
 Noticed on scan 03/2023. Has thyroid  ultrasound next week.

## 2023-08-12 NOTE — Assessment & Plan Note (Signed)
 Low cholesterol diet. Exercise as tolerated. Follow lipid panel. Lab Results  Component Value Date   CHOL 155 03/19/2023   HDL 89.70 03/19/2023   LDLCALC 53 03/19/2023   TRIG 62.0 03/19/2023   CHOLHDL 2 03/19/2023

## 2023-08-12 NOTE — Assessment & Plan Note (Signed)
 Home health PT. Discussed need for PT and exercises.

## 2023-08-12 NOTE — Assessment & Plan Note (Signed)
 Continue losartan  and amlodipine .  Follow pressures. Follow metabolic panel.  Lab Results  Component Value Date   CREATININE 0.91 08/08/2023

## 2023-08-12 NOTE — Assessment & Plan Note (Signed)
 On simvastatin.  Low cholesterol diet and exercise.  Follow lipid panel and liver function tests.  No change today.  Lab Results  Component Value Date   CHOL 155 03/19/2023   HDL 89.70 03/19/2023   LDLCALC 53 03/19/2023   TRIG 62.0 03/19/2023   CHOLHDL 2 03/19/2023

## 2023-08-12 NOTE — Assessment & Plan Note (Signed)
 CT scan as outlined.  Plan appt with neurology.

## 2023-08-12 NOTE — Assessment & Plan Note (Signed)
 Has previously seen [facility]. Previous wrapping. Discussed need for f/u.

## 2023-08-12 NOTE — Assessment & Plan Note (Signed)
 Sugars have been controlled.  Continue low carb diet and exercise.  Follow met b and A1c. No changes today.  Lab Results  Component Value Date   HGBA1C 6.0 03/19/2023

## 2023-08-12 NOTE — Assessment & Plan Note (Addendum)
 Discussed importance of working with PT - strengthening exercise. Continue eliquis. Use walking. Will follow.

## 2023-08-12 NOTE — Assessment & Plan Note (Signed)
Continue losartan.  Follow pressures.      

## 2023-08-12 NOTE — Assessment & Plan Note (Signed)
 Found during recent CT - during hospitalization. Plan for f/u MRI abdomen. Will see if we can get her stronger and plan f/u with [facility] and neurology as outlined.

## 2023-08-12 NOTE — Assessment & Plan Note (Signed)
 CT 06/2023 -  Examination positive for extensive bilateral pulmonary emboli with enlarged pulmonary artery suggesting pulmonary hypertension but no CT signs of right heart strain.  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  Large hiatal hernia. Continues on eliquis. Breathing stable.

## 2023-08-13 ENCOUNTER — Telehealth: Payer: Self-pay

## 2023-08-13 NOTE — Telephone Encounter (Signed)
 Copied from CRM [id number]. Topic: Clinical - Home Health Verbal Orders >> Aug 13, 2023 11:51 AM Howard Macho wrote: Caller/Agency: amanda from [facility] Callback Number: [phone] option 2 Service Requested: Physical Therapy evaluation because the patient missed it because she was in the hospital. (Can give verbal order to any one of the nurses at the office) Frequency:  Any new concerns about the patient? No

## 2023-08-13 NOTE — Telephone Encounter (Signed)
 Ok

## 2023-08-13 NOTE — Telephone Encounter (Signed)
Verbals given  

## 2023-08-13 NOTE — Telephone Encounter (Signed)
 Verbals given to [facility]

## 2023-08-13 NOTE — Telephone Encounter (Signed)
 Ok for [employer]

## 2023-08-14 ENCOUNTER — Other Ambulatory Visit: Payer: Self-pay

## 2023-08-14 DIAGNOSIS — E041 Nontoxic single thyroid nodule: Secondary | ICD-10-CM | POA: Diagnosis not present

## 2023-08-14 MED ORDER — SIMVASTATIN 10 MG PO TABS
10.0000 mg | ORAL_TABLET | Freq: Every day | ORAL | 2 refills | Status: DC
Start: 2023-08-14 — End: 2024-01-08

## 2023-08-14 NOTE — Telephone Encounter (Signed)
 FYI for you- Anita Walker to make sure nothing acute going on. She says that her moms ankles are swollen but she is not elevating her legs or wearing her compression hose. Advised to start doing both of these things and see if any improvement. Daughter also wanted to know if neurology referral was placed- advised that it is in process and someone should be calling her. Daughter will let us  know if anything further is needed.

## 2023-08-14 NOTE — Telephone Encounter (Signed)
 Called [facility]. She was previously seeing them for lower extremity swelling (with prn wraps, etc). I have called to see about a f/u appt with them. Someone should be calling with appt date and time. Let me know if any acute issues or anything needs more urgent evaluation.

## 2023-08-15 ENCOUNTER — Ambulatory Visit (INDEPENDENT_AMBULATORY_CARE_PROVIDER_SITE_OTHER): Admitting: Podiatry

## 2023-08-15 ENCOUNTER — Ambulatory Visit: Admitting: Internal Medicine

## 2023-08-15 ENCOUNTER — Telehealth: Payer: Self-pay

## 2023-08-15 DIAGNOSIS — I2699 Other pulmonary embolism without acute cor pulmonale: Secondary | ICD-10-CM | POA: Diagnosis not present

## 2023-08-15 DIAGNOSIS — B351 Tinea unguium: Secondary | ICD-10-CM

## 2023-08-15 DIAGNOSIS — I82403 Acute embolism and thrombosis of unspecified deep veins of lower extremity, bilateral: Secondary | ICD-10-CM | POA: Diagnosis not present

## 2023-08-15 DIAGNOSIS — M79675 Pain in left toe(s): Secondary | ICD-10-CM | POA: Diagnosis not present

## 2023-08-15 DIAGNOSIS — M79674 Pain in right toe(s): Secondary | ICD-10-CM | POA: Diagnosis not present

## 2023-08-15 DIAGNOSIS — I110 Hypertensive heart disease with heart failure: Secondary | ICD-10-CM | POA: Diagnosis not present

## 2023-08-15 DIAGNOSIS — J9601 Acute respiratory failure with hypoxia: Secondary | ICD-10-CM | POA: Diagnosis not present

## 2023-08-15 DIAGNOSIS — I4891 Unspecified atrial fibrillation: Secondary | ICD-10-CM | POA: Diagnosis not present

## 2023-08-15 DIAGNOSIS — I5033 Acute on chronic diastolic (congestive) heart failure: Secondary | ICD-10-CM | POA: Diagnosis not present

## 2023-08-15 NOTE — Telephone Encounter (Signed)
 Klann states she has an Emergency she is going to and can not do it tomorrow. Davoli states if you have a virtual opening in the morning then the care taker Laveta Pottier could do a virtual with the Patient.

## 2023-08-15 NOTE — Telephone Encounter (Signed)
 Copied from CRM [id number]. Topic: Clinical - Medical Advice >> Aug 15, 2023 12:17 PM Deaijah H wrote: Reason for CRM: Patients daughter JoAnna would like to have Dr. Thayne Fine nurse Laveta Pottier give a call due to patient putting on 5lbs since last week and nervous due to congestive heart failure. Please call [phone] >> Aug 15, 2023  1:19 PM Chuck Crater wrote: Patient daughter wants to get a message over to [place]. She stated that patient doesnt have shortness of breath but does have a rattle in her chest. She is requesting a callback.

## 2023-08-15 NOTE — Telephone Encounter (Signed)
 Please call Ms Robledo daughter [place]) and see if they would be willing to do a virtual visit - to discuss her weight, medication changes and follow up evaluations with her legs and heart.  I can add her on tomorrow at 4:30.

## 2023-08-15 NOTE — Telephone Encounter (Signed)
 Reviewed note. Unable to keep appt at 4:30. Please call the caretaker early am and see what Anita Walker weighs. Get her weights. See how she is breathing. Also notify her that I have made her an appt with cardiology Dr Beau Bound - to follow up - given the increased swelling and weight gain. Her appt is scheduled for 08/24/23 at 11:15. Also, I have call [facility] to see about earlier appt - for her lower extremity swelling

## 2023-08-15 NOTE — Telephone Encounter (Signed)
 Called [facility] to follow up and see if any acute symptoms. Pt has no SOB. Ankles are swollen and 5 lb weight gain in one week. She takes Torsemide 2 tablets per day. She said that she has always been told to call in if she has a weight gain like this.

## 2023-08-15 NOTE — Telephone Encounter (Signed)
 Copied from CRM [id number]. Topic: Clinical - Medical Advice >> Aug 15, 2023 12:17 PM Deaijah H wrote: Reason for CRM: Patients daughter JoAnna would like to have Dr. Thayne Fine nurse Laveta Pottier give a call due to patient putting on 5lbs since last week and nervous due to congestive heart failure. Please call [phone]

## 2023-08-16 ENCOUNTER — Ambulatory Visit: Payer: Self-pay

## 2023-08-16 NOTE — Telephone Encounter (Signed)
 FYI Only or Action Required?: FYI only for provider  Patient was last seen in primary care on 08/08/2023 by Dellar Fenton, MD. Called Nurse Triage reporting No chief complaint on file.. Symptoms began today. Interventions attempted: Nothing. Symptoms are: unchanged.  Triage Disposition: Information or Advice Only Call  Patient/caregiver understands and will follow disposition?: Yes  Copied from CRM [id number]. Topic: Clinical - Medical Advice >> Aug 16, 2023 10:27 AM Magdalene School wrote: Reason for CRM: Patient called to notify that she currently weighs 232lb, which is 7lb weight gain in one week and no shortness of breath. She would like a call back as soon as possible because she is leaving out of town at 12:30PM.  Callback: [phone]

## 2023-08-16 NOTE — Telephone Encounter (Signed)
 Patient has been scheduled

## 2023-08-16 NOTE — Telephone Encounter (Signed)
 Spoke with patients daughter. She weighed 225 8 days ago. Yesterday her weight was 230. No SOB. I have notified [facility] about appointments. Her caregiver is going to weigh her this morning when she gets up and will report weight via my chart. Palla also noted that she got her some compression stockings to start wearing.

## 2023-08-16 NOTE — Telephone Encounter (Signed)
 Coming in to see me tomorrow.

## 2023-08-16 NOTE — Telephone Encounter (Signed)
 Pt scheduled

## 2023-08-16 NOTE — Telephone Encounter (Signed)
 Update. Weight is 232 today. No SOB.

## 2023-08-16 NOTE — Telephone Encounter (Signed)
 I can work her in at 10:30 tomorrow am - work in for this

## 2023-08-16 NOTE — Telephone Encounter (Signed)
 See my chart message. I can work her in for this tomorrow am - 10:30

## 2023-08-17 ENCOUNTER — Ambulatory Visit (INDEPENDENT_AMBULATORY_CARE_PROVIDER_SITE_OTHER): Admitting: Internal Medicine

## 2023-08-17 ENCOUNTER — Encounter: Payer: Self-pay | Admitting: Internal Medicine

## 2023-08-17 ENCOUNTER — Ambulatory Visit (INDEPENDENT_AMBULATORY_CARE_PROVIDER_SITE_OTHER)

## 2023-08-17 ENCOUNTER — Ambulatory Visit: Payer: Self-pay | Admitting: Internal Medicine

## 2023-08-17 VITALS — BP 114/72 | HR 89 | Temp 98.2°F | Resp 16 | Ht 67.0 in | Wt 231.2 lb

## 2023-08-17 DIAGNOSIS — K449 Diaphragmatic hernia without obstruction or gangrene: Secondary | ICD-10-CM | POA: Diagnosis not present

## 2023-08-17 DIAGNOSIS — F419 Anxiety disorder, unspecified: Secondary | ICD-10-CM | POA: Diagnosis not present

## 2023-08-17 DIAGNOSIS — R0981 Nasal congestion: Secondary | ICD-10-CM

## 2023-08-17 DIAGNOSIS — I10 Essential (primary) hypertension: Secondary | ICD-10-CM | POA: Diagnosis not present

## 2023-08-17 DIAGNOSIS — E7800 Pure hypercholesterolemia, unspecified: Secondary | ICD-10-CM | POA: Diagnosis not present

## 2023-08-17 DIAGNOSIS — R0989 Other specified symptoms and signs involving the circulatory and respiratory systems: Secondary | ICD-10-CM | POA: Diagnosis not present

## 2023-08-17 DIAGNOSIS — E1149 Type 2 diabetes mellitus with other diabetic neurological complication: Secondary | ICD-10-CM

## 2023-08-17 DIAGNOSIS — M7989 Other specified soft tissue disorders: Secondary | ICD-10-CM | POA: Insufficient documentation

## 2023-08-17 DIAGNOSIS — R635 Abnormal weight gain: Secondary | ICD-10-CM | POA: Diagnosis not present

## 2023-08-17 NOTE — Progress Notes (Unsigned)
 Subjective:    [name], female    DOB: Apr 27, 1940, 83 y.o.   MRN: [id number]  Patient here for  Chief Complaint  Patient presents with   Medical Management of Chronic Issues    HPI Here for a work in appt. Work in with weight gain and lower extremity swelling. Currently taking torsemide bid. Weight. Reported weight gain of 7 pounds this past week.    Past Medical History:  Diagnosis Date   Anemia    Arthritis    Atrial fibrillation (HCC)    Breast cancer (HCC)    s/p lumpectomy 1992.  s/p chemo and xrt left breast   Diabetes mellitus (HCC)    Edema    feet/legs   Gastric ulcer    GERD (gastroesophageal reflux disease)    HOH (hard of hearing)    aides   Hypercholesterolemia    Hypertension    Personal history of chemotherapy    Pulmonary emboli [facility])    Past Surgical History:  Procedure Laterality Date   BREAST LUMPECTOMY  03/06/1990   left breast   CATARACT EXTRACTION W/PHACO Right 12/26/2016   Procedure: CATARACT EXTRACTION PHACO AND INTRAOCULAR LENS PLACEMENT (IOC)-RIGHT DIABETIC;  Surgeon: Clair Crews, MD;  Location: [facility] ORS;  Service: Ophthalmology;  Laterality: Right;  US  00:47 AP% 24.5 CDE 11.62 Fluid pack lot # [id number]   CATARACT EXTRACTION W/PHACO Left 01/23/2017   Procedure: CATARACT EXTRACTION PHACO AND INTRAOCULAR LENS PLACEMENT (IOC);  Surgeon: Clair Crews, MD;  Location: [facility] ORS;  Service: Ophthalmology;  Laterality: Left;  US  00:50 AP% 16.1 CDE 8.18 Fluid pack lot #[id number]   COLONOSCOPY WITH PROPOFOL  N/A 07/08/2019   Procedure: COLONOSCOPY WITH PROPOFOL ;  Surgeon: Marnee Sink, MD;  Location: [facility] ENDOSCOPY;  Service: Endoscopy;  Laterality: N/A;   Family History  Problem Relation Age of Onset   Heart disease Father    Heart disease Brother        s/p CABG   Breast cancer Daughter    Colon cancer Neg Hx    Social History   Socioeconomic History   Marital status: Widowed    Spouse name: Not on file   Number  of children: Not on file   Years of education: Not on file   Highest education level: 10th grade  Occupational History   Not on file  Tobacco Use   Smoking status: Never   Smokeless tobacco: Never  Vaping Use   Vaping status: Never Used  Substance and Sexual Activity   Alcohol use: No    Alcohol/week: 0.0 standard drinks of alcohol   Drug use: No   Sexual activity: Never  Other Topics Concern   Not on file  Social History Narrative   Not on file   Social Drivers of Health   Financial Resource Strain: Low Risk  (06/25/2023)   Overall Financial Resource Strain (CARDIA)    Difficulty of Paying Living Expenses: Not hard at all  Food Insecurity: No Food Insecurity (06/25/2023)   Hunger Vital Sign    Worried About Running Out of Food in the Last Year: Never true    Ran Out of Food in the Last Year: Never true  Transportation Needs: No Transportation Needs (06/25/2023)   PRAPARE - Administrator, Civil Service (Medical): No    Lack of Transportation (Non-Medical): No  Physical Activity: Unknown (06/25/2023)   Exercise Vital Sign    Days of Exercise per Week: 0 days    Minutes of Exercise  per Session: Not on file  Stress: No Stress Concern Present (06/25/2023)   [employer] of Occupational Health - Occupational Stress Questionnaire    Feeling of Stress : Not at all  Social Connections: Socially Isolated (06/25/2023)   Social Connection and Isolation Panel    Frequency of Communication with Friends and Family: Never    Frequency of Social Gatherings with Friends and Family: Never    Attends Religious Services: Never    Database administrator or Organizations: No    Attends Banker Meetings: Never    Marital Status: Widowed     Review of Systems     Objective:     BP 114/72   Pulse 89   Temp 98.2 F (36.8 C)   Resp 16   Ht 5' 7 (1.702 m)   Wt 231 lb 3.2 oz (104.9 kg)   SpO2 97%   BMI 36.21 kg/m  Wt Readings from Last 3 Encounters:   08/17/23 231 lb 3.2 oz (104.9 kg)  08/08/23 225 lb (102.1 kg)  08/02/23 227 lb (103 kg)    Physical Exam  {Perform Simple Foot Exam  Perform Detailed exam:1} {Insert foot Exam (Optional):30965}   Outpatient Encounter Medications as of 08/17/2023  Medication Sig   acetaminophen  (TYLENOL ) 325 MG tablet Take by mouth.   alum & mag hydroxide-simeth (MAALOX/MYLANTA) 200-200-20 MG/5ML suspension Take 15 mLs by mouth every 6 (six) hours as needed for indigestion or heartburn.   apixaban (ELIQUIS) 5 MG TABS tablet Take 5 mg by mouth 2 (two) times daily.   busPIRone  (BUSPAR ) 5 MG tablet Take 1 tablet (5 mg total) by mouth daily as needed.   diphenhydrAMINE  (BENADRYL ) 25 mg capsule Take 25 mg at bedtime as needed by mouth for sleep.    Docusate Calcium (STOOL SOFTENER PO) Take by mouth.   escitalopram (LEXAPRO) 10 MG tablet Take 10 mg by mouth daily.   feeding supplement (ENSURE ENLIVE / ENSURE PLUS) LIQD Take 237 mLs by mouth 2 (two) times daily between meals.   fluticasone  (FLONASE ) 50 MCG/ACT nasal spray Place 2 sprays into both nostrils daily.   glucose blood (CONTOUR TEST) test strip USE TO TEST BLOOD SUGAR ONCE DAILY   losartan  (COZAAR ) 50 MG tablet Take 50 mg by mouth daily.   Multiple Vitamin (MULTIVITAMIN WITH MINERALS) TABS tablet Take 1 tablet daily by mouth.   ondansetron  (ZOFRAN -ODT) 4 MG disintegrating tablet Take 1 tablet (4 mg total) by mouth every 8 (eight) hours as needed for nausea or vomiting.   pantoprazole  (PROTONIX ) 40 MG tablet TAKE 1 TABLET(40 MG) BY MOUTH DAILY   simvastatin  (ZOCOR ) 10 MG tablet Take 1 tablet (10 mg total) by mouth daily.   torsemide (DEMADEX) 20 MG tablet Take 20 mg by mouth daily.   No facility-administered encounter medications on file as of 08/17/2023.     Lab Results  Component Value Date   WBC 6.7 08/08/2023   HGB 12.9 08/08/2023   HCT 38.6 08/08/2023   PLT 267.0 08/08/2023   GLUCOSE 104 (H) 08/08/2023   CHOL 155 03/19/2023   TRIG 62.0  03/19/2023   HDL 89.70 03/19/2023   LDLCALC 53 03/19/2023   ALT 19 03/19/2023   AST 19 03/19/2023   NA 136 08/08/2023   K 3.9 08/08/2023   CL 93 (L) 08/08/2023   CREATININE 0.91 08/08/2023   BUN 16 08/08/2023   CO2 25 08/08/2023   TSH 2.38 08/08/2023   INR 1.2 (H) 07/22/2020   HGBA1C 6.0  03/19/2023   MICROALBUR 1.6 03/19/2023    CT Head Wo Contrast Result Date: 08/02/2023 CLINICAL DATA:  Head trauma, minor (Age >= 65y); Neck trauma (Age >= 65y). Fall. Head and neck pain. On blood thinners. EXAM: CT HEAD WITHOUT CONTRAST CT CERVICAL SPINE WITHOUT CONTRAST TECHNIQUE: Multidetector CT imaging of the head and cervical spine was performed following the standard protocol without intravenous contrast. Multiplanar CT image reconstructions of the cervical spine were also generated. RADIATION DOSE REDUCTION: This exam was performed according to the departmental dose-optimization program which includes automated exposure control, adjustment of the mA and/or kV according to patient size and/or use of iterative reconstruction technique. COMPARISON:  Cervical spine CT 04/15/2020. Head CT and MRI 03/23/2023. FINDINGS: CT HEAD FINDINGS Brain: There is no evidence of an acute infarct, intracranial hemorrhage, mass, midline shift, or extra-axial fluid collection. Patchy and confluent hypodensities in the cerebral white matter are unchanged and nonspecific but compatible with moderate to severe chronic small vessel ischemic disease. There is mild cerebral atrophy. Vascular: Calcified atherosclerosis at the skull base. No hyperdense vessel. Skull: No fracture or suspicious lesion. Sinuses/Orbits: Minimal mucosal thickening or fluid in the right sphenoid sinus. Clear mastoid air cells. Bilateral cataract extraction. Other: None. CT CERVICAL SPINE FINDINGS Alignment: Chronic straightening of the normal cervical lordosis with trace anterolisthesis of C3 on C4 and C4 on C5. Skull base and vertebrae: No acute fracture or  suspicious lesion. Chronic compression deformities of the C7, T1, and T2 vertebral bodies. Soft tissues and spinal canal: No prevertebral fluid or swelling. No visible canal hematoma. Disc levels: Multilevel disc degeneration with disc space height loss being most severe at C5-6. Widespread facet arthrosis, asymmetrically advanced on the left at C3-4 and C4-5. Facet ankylosis at C2-3. Moderate right neural foraminal stenosis at C5-6 due to uncovertebral spurring. Upper chest: Focal band-like opacity in the posterior left upper lobe suggestive of scarring or atelectasis. Other: None. IMPRESSION: 1. No evidence of acute intracranial abnormality or cervical spine fracture. 2. Moderate to severe chronic small vessel ischemic disease. Electronically Signed   By: Aundra Lee M.D.   On: 08/02/2023 12:05   CT Cervical Spine Wo Contrast Result Date: 08/02/2023 CLINICAL DATA:  Head trauma, minor (Age >= 65y); Neck trauma (Age >= 65y). Fall. Head and neck pain. On blood thinners. EXAM: CT HEAD WITHOUT CONTRAST CT CERVICAL SPINE WITHOUT CONTRAST TECHNIQUE: Multidetector CT imaging of the head and cervical spine was performed following the standard protocol without intravenous contrast. Multiplanar CT image reconstructions of the cervical spine were also generated. RADIATION DOSE REDUCTION: This exam was performed according to the departmental dose-optimization program which includes automated exposure control, adjustment of the mA and/or kV according to patient size and/or use of iterative reconstruction technique. COMPARISON:  Cervical spine CT 04/15/2020. Head CT and MRI 03/23/2023. FINDINGS: CT HEAD FINDINGS Brain: There is no evidence of an acute infarct, intracranial hemorrhage, mass, midline shift, or extra-axial fluid collection. Patchy and confluent hypodensities in the cerebral white matter are unchanged and nonspecific but compatible with moderate to severe chronic small vessel ischemic disease. There is mild  cerebral atrophy. Vascular: Calcified atherosclerosis at the skull base. No hyperdense vessel. Skull: No fracture or suspicious lesion. Sinuses/Orbits: Minimal mucosal thickening or fluid in the right sphenoid sinus. Clear mastoid air cells. Bilateral cataract extraction. Other: None. CT CERVICAL SPINE FINDINGS Alignment: Chronic straightening of the normal cervical lordosis with trace anterolisthesis of C3 on C4 and C4 on C5. Skull base and vertebrae: No acute fracture or  suspicious lesion. Chronic compression deformities of the C7, T1, and T2 vertebral bodies. Soft tissues and spinal canal: No prevertebral fluid or swelling. No visible canal hematoma. Disc levels: Multilevel disc degeneration with disc space height loss being most severe at C5-6. Widespread facet arthrosis, asymmetrically advanced on the left at C3-4 and C4-5. Facet ankylosis at C2-3. Moderate right neural foraminal stenosis at C5-6 due to uncovertebral spurring. Upper chest: Focal band-like opacity in the posterior left upper lobe suggestive of scarring or atelectasis. Other: None. IMPRESSION: 1. No evidence of acute intracranial abnormality or cervical spine fracture. 2. Moderate to severe chronic small vessel ischemic disease. Electronically Signed   By: Aundra Lee M.D.   On: 08/02/2023 12:05   DG Chest 2 View Result Date: 08/02/2023 CLINICAL DATA:  Fall EXAM: CHEST - 2 VIEW COMPARISON:  Chest x-ray performed March 23, 2023 FINDINGS: No pneumothorax or pleural effusion. Mild interstitial prominence. Heart mediastinum are not significantly changed. Hiatal hernia. Degenerative changes in the imaged osseous structures with underlying changes of osteopenia. IMPRESSION: 1. No pneumothorax. 2. Hiatal hernia. Electronically Signed   By: Reagan Camera M.D.   On: 08/02/2023 11:40       Assessment & Plan:  There are no diagnoses linked to this encounter.   Dellar Fenton, MD

## 2023-08-18 ENCOUNTER — Encounter: Payer: Self-pay | Admitting: Internal Medicine

## 2023-08-18 DIAGNOSIS — R635 Abnormal weight gain: Secondary | ICD-10-CM | POA: Insufficient documentation

## 2023-08-18 LAB — BASIC METABOLIC PANEL WITH GFR
BUN/Creatinine Ratio: 20 (ref 12–28)
BUN: 16 mg/dL (ref 8–27)
CO2: 21 mmol/L (ref 20–29)
Calcium: 9.9 mg/dL (ref 8.7–10.3)
Chloride: 98 mmol/L (ref 96–106)
Creatinine, Ser: 0.82 mg/dL (ref 0.57–1.00)
Glucose: 105 mg/dL — ABNORMAL HIGH (ref 70–99)
Potassium: 4.7 mmol/L (ref 3.5–5.2)
Sodium: 139 mmol/L (ref 134–144)
eGFR: 70 mL/min/{1.73_m2} (ref >59)

## 2023-08-18 NOTE — Assessment & Plan Note (Signed)
 Persistent swelling as outlined. Discussed monitor sodium with diet. Discussed leg elevation. Has previously been followed by [facility]. Have contacted vascular for earlier appt. Prior to hospitalization, was having legs wrapped.

## 2023-08-18 NOTE — Assessment & Plan Note (Signed)
 On simvastatin.  Low cholesterol diet and exercise.  Follow lipid panel and liver function tests.  No change today.  Lab Results  Component Value Date   CHOL 155 03/19/2023   HDL 89.70 03/19/2023   LDLCALC 53 03/19/2023   TRIG 62.0 03/19/2023   CHOLHDL 2 03/19/2023

## 2023-08-18 NOTE — Assessment & Plan Note (Signed)
 On questioning regarding the weight, caretaker reports she has been weighing herself in the am and has reported weight between 225 - 227. Caretaker started the last two days also weighing and reports weight of 232 both days. (Reports Anita Walker - both days said she weighed 227 pounds). Not sure if she (the patient) is reporting correct weight - given variation same day and stable two days caretaker checked. Will hold on adding medication. Continue torsemide dose as she is doing. Confirm takes regularly. Caretaker to continue to weigh daily and report readings. Check metabolic panel today. Breathing stable.

## 2023-08-18 NOTE — Assessment & Plan Note (Signed)
 Low carb diet and exercise as tolerated.  Follow met b and a1c. No changes today.  Lab Results  Component Value Date   HGBA1C 6.0 03/19/2023

## 2023-08-18 NOTE — Assessment & Plan Note (Signed)
Continue losartan.  Follow pressures.      

## 2023-08-18 NOTE — Assessment & Plan Note (Signed)
 Some congestion. Discussed using nasal spray. Given some cough, will check cxr.

## 2023-08-18 NOTE — Assessment & Plan Note (Signed)
 Back on buspar . Appears to be doing better today. Follow.

## 2023-08-19 NOTE — Progress Notes (Signed)
  Subjective:  [name], female    DOB: 1940/12/25,  MRN: [id number]  Chief Complaint  Patient presents with   Nail Problem    4 month nail trim    83 y.o. female presents with the above complaint. History confirmed with patient.  Nails are thickened elongated and uncomfortable, causing pain in shoe gear  Objective:  Physical Exam: warm, good capillary refill, no trophic changes or ulcerative lesions, normal DP and PT pulses, and normal sensory exam. Left Foot: dystrophic yellowed discolored nail plates with subungual debris Right Foot: dystrophic yellowed discolored nail plates with subungual debris   Assessment:   1. Pain due to onychomycosis of toenails of both feet      Plan:  Patient was evaluated and treated and all questions answered.   Discussed the etiology and treatment options for the condition in detail with the patient.  Recommended debridement of the nails today. Sharp and mechanical debridement performed of all painful and mycotic nails today. Nails debrided in length and thickness using a nail nipper to level of comfort.      Return in about 4 months (around 12/15/2023) for painful thick fungal nails.

## 2023-08-20 ENCOUNTER — Telehealth: Payer: Self-pay

## 2023-08-20 NOTE — Telephone Encounter (Signed)
 Ok

## 2023-08-20 NOTE — Telephone Encounter (Signed)
 Ok for [employer]

## 2023-08-20 NOTE — Telephone Encounter (Signed)
 Copied from CRM [id number]. Topic: Clinical - Home Health Verbal Orders >> Aug 20, 2023  1:18 PM Shereese L wrote: Caller/Agency: [facility]/ [facility] Callback Number: [phone] option 2 Service Requested: Physical Therapy Frequency:verbal approval to get evaluation done Any new concerns about the patient? No

## 2023-08-21 ENCOUNTER — Encounter: Payer: Self-pay | Admitting: Internal Medicine

## 2023-08-21 NOTE — Telephone Encounter (Signed)
 Verbal orders given

## 2023-08-22 ENCOUNTER — Ambulatory Visit: Admitting: Podiatry

## 2023-08-22 DIAGNOSIS — I4891 Unspecified atrial fibrillation: Secondary | ICD-10-CM | POA: Diagnosis not present

## 2023-08-22 DIAGNOSIS — I2699 Other pulmonary embolism without acute cor pulmonale: Secondary | ICD-10-CM | POA: Diagnosis not present

## 2023-08-22 DIAGNOSIS — J9601 Acute respiratory failure with hypoxia: Secondary | ICD-10-CM | POA: Diagnosis not present

## 2023-08-22 DIAGNOSIS — I5033 Acute on chronic diastolic (congestive) heart failure: Secondary | ICD-10-CM | POA: Diagnosis not present

## 2023-08-22 DIAGNOSIS — I110 Hypertensive heart disease with heart failure: Secondary | ICD-10-CM | POA: Diagnosis not present

## 2023-08-22 DIAGNOSIS — I82403 Acute embolism and thrombosis of unspecified deep veins of lower extremity, bilateral: Secondary | ICD-10-CM | POA: Diagnosis not present

## 2023-08-24 ENCOUNTER — Telehealth: Payer: Self-pay

## 2023-08-24 DIAGNOSIS — I4891 Unspecified atrial fibrillation: Secondary | ICD-10-CM | POA: Diagnosis not present

## 2023-08-24 DIAGNOSIS — E1142 Type 2 diabetes mellitus with diabetic polyneuropathy: Secondary | ICD-10-CM | POA: Diagnosis not present

## 2023-08-24 DIAGNOSIS — I5033 Acute on chronic diastolic (congestive) heart failure: Secondary | ICD-10-CM | POA: Diagnosis not present

## 2023-08-24 DIAGNOSIS — E7800 Pure hypercholesterolemia, unspecified: Secondary | ICD-10-CM | POA: Diagnosis not present

## 2023-08-24 DIAGNOSIS — J9601 Acute respiratory failure with hypoxia: Secondary | ICD-10-CM | POA: Diagnosis not present

## 2023-08-24 DIAGNOSIS — K219 Gastro-esophageal reflux disease without esophagitis: Secondary | ICD-10-CM | POA: Diagnosis not present

## 2023-08-24 DIAGNOSIS — R0602 Shortness of breath: Secondary | ICD-10-CM | POA: Diagnosis not present

## 2023-08-24 DIAGNOSIS — I10 Essential (primary) hypertension: Secondary | ICD-10-CM | POA: Diagnosis not present

## 2023-08-24 DIAGNOSIS — I82403 Acute embolism and thrombosis of unspecified deep veins of lower extremity, bilateral: Secondary | ICD-10-CM | POA: Diagnosis not present

## 2023-08-24 DIAGNOSIS — I110 Hypertensive heart disease with heart failure: Secondary | ICD-10-CM | POA: Diagnosis not present

## 2023-08-24 DIAGNOSIS — I5022 Chronic systolic (congestive) heart failure: Secondary | ICD-10-CM | POA: Diagnosis not present

## 2023-08-24 DIAGNOSIS — E66812 Obesity, class 2: Secondary | ICD-10-CM | POA: Diagnosis not present

## 2023-08-24 DIAGNOSIS — R600 Localized edema: Secondary | ICD-10-CM | POA: Diagnosis not present

## 2023-08-24 DIAGNOSIS — E66811 Obesity, class 1: Secondary | ICD-10-CM | POA: Diagnosis not present

## 2023-08-24 DIAGNOSIS — Z6835 Body mass index (BMI) 35.0-35.9, adult: Secondary | ICD-10-CM | POA: Diagnosis not present

## 2023-08-24 DIAGNOSIS — E119 Type 2 diabetes mellitus without complications: Secondary | ICD-10-CM | POA: Diagnosis not present

## 2023-08-24 DIAGNOSIS — I2699 Other pulmonary embolism without acute cor pulmonale: Secondary | ICD-10-CM | POA: Diagnosis not present

## 2023-08-24 DIAGNOSIS — G4733 Obstructive sleep apnea (adult) (pediatric): Secondary | ICD-10-CM | POA: Diagnosis not present

## 2023-08-24 NOTE — Telephone Encounter (Signed)
 I called patient to reschedule 10/18/2023 visit with Dr. Dellar Fenton, as she will be out of the office.  Patient states she is in the car, on the way to an appointment.  Patient asked that we please call her back to reschedule.  I let patient know that we will reach back out to her.  I sent patient a message via MyChart asking her to please call us  back to reschedule this visit.  E2C2 - when patient calls back, please assist her with rescheduling her appointment with Dr. Dellar Fenton.

## 2023-08-24 NOTE — Telephone Encounter (Signed)
 I called patient back this afternoon and rescheduled her appointment with Dr. Dellar Fenton from 10/18/2023 to  11/06/2023.  This was supposed to be a 21-month follow-up, but patient needs 10am or later, so we had to schedule out a little further.  I will send a message to Dr. Geralyn Knee to be sure she is ok with this appointment date.

## 2023-08-27 NOTE — Telephone Encounter (Signed)
 Are you okay with her waiting until Sept to be seen due to their scheduling needs. She just saw cardiology and is seeing neurology and vascular in July.

## 2023-08-27 NOTE — Telephone Encounter (Signed)
 Noted

## 2023-08-27 NOTE — Telephone Encounter (Signed)
 Please call and schedule appt. (Can call daughter Seybold if needed)

## 2023-08-27 NOTE — Telephone Encounter (Signed)
 I can see her at 11:00 on 10/11/23.

## 2023-08-28 DIAGNOSIS — I82403 Acute embolism and thrombosis of unspecified deep veins of lower extremity, bilateral: Secondary | ICD-10-CM | POA: Diagnosis not present

## 2023-08-28 DIAGNOSIS — I5033 Acute on chronic diastolic (congestive) heart failure: Secondary | ICD-10-CM | POA: Diagnosis not present

## 2023-08-28 DIAGNOSIS — I110 Hypertensive heart disease with heart failure: Secondary | ICD-10-CM | POA: Diagnosis not present

## 2023-08-28 DIAGNOSIS — I2699 Other pulmonary embolism without acute cor pulmonale: Secondary | ICD-10-CM | POA: Diagnosis not present

## 2023-08-28 DIAGNOSIS — J9601 Acute respiratory failure with hypoxia: Secondary | ICD-10-CM | POA: Diagnosis not present

## 2023-08-28 DIAGNOSIS — I4891 Unspecified atrial fibrillation: Secondary | ICD-10-CM | POA: Diagnosis not present

## 2023-08-29 DIAGNOSIS — J9601 Acute respiratory failure with hypoxia: Secondary | ICD-10-CM | POA: Diagnosis not present

## 2023-08-29 DIAGNOSIS — I5033 Acute on chronic diastolic (congestive) heart failure: Secondary | ICD-10-CM | POA: Diagnosis not present

## 2023-08-29 DIAGNOSIS — I2699 Other pulmonary embolism without acute cor pulmonale: Secondary | ICD-10-CM | POA: Diagnosis not present

## 2023-08-29 DIAGNOSIS — I4891 Unspecified atrial fibrillation: Secondary | ICD-10-CM | POA: Diagnosis not present

## 2023-08-29 DIAGNOSIS — I82403 Acute embolism and thrombosis of unspecified deep veins of lower extremity, bilateral: Secondary | ICD-10-CM | POA: Diagnosis not present

## 2023-08-29 DIAGNOSIS — I110 Hypertensive heart disease with heart failure: Secondary | ICD-10-CM | POA: Diagnosis not present

## 2023-09-04 DIAGNOSIS — J9601 Acute respiratory failure with hypoxia: Secondary | ICD-10-CM | POA: Diagnosis not present

## 2023-09-04 DIAGNOSIS — I82403 Acute embolism and thrombosis of unspecified deep veins of lower extremity, bilateral: Secondary | ICD-10-CM | POA: Diagnosis not present

## 2023-09-04 DIAGNOSIS — I4891 Unspecified atrial fibrillation: Secondary | ICD-10-CM | POA: Diagnosis not present

## 2023-09-04 DIAGNOSIS — I110 Hypertensive heart disease with heart failure: Secondary | ICD-10-CM | POA: Diagnosis not present

## 2023-09-04 DIAGNOSIS — I5033 Acute on chronic diastolic (congestive) heart failure: Secondary | ICD-10-CM | POA: Diagnosis not present

## 2023-09-04 DIAGNOSIS — I2699 Other pulmonary embolism without acute cor pulmonale: Secondary | ICD-10-CM | POA: Diagnosis not present

## 2023-09-05 ENCOUNTER — Telehealth: Payer: Self-pay

## 2023-09-05 DIAGNOSIS — J9601 Acute respiratory failure with hypoxia: Secondary | ICD-10-CM | POA: Diagnosis not present

## 2023-09-05 DIAGNOSIS — I4891 Unspecified atrial fibrillation: Secondary | ICD-10-CM | POA: Diagnosis not present

## 2023-09-05 DIAGNOSIS — I2699 Other pulmonary embolism without acute cor pulmonale: Secondary | ICD-10-CM | POA: Diagnosis not present

## 2023-09-05 DIAGNOSIS — I110 Hypertensive heart disease with heart failure: Secondary | ICD-10-CM | POA: Diagnosis not present

## 2023-09-05 DIAGNOSIS — I82403 Acute embolism and thrombosis of unspecified deep veins of lower extremity, bilateral: Secondary | ICD-10-CM | POA: Diagnosis not present

## 2023-09-05 DIAGNOSIS — I5033 Acute on chronic diastolic (congestive) heart failure: Secondary | ICD-10-CM | POA: Diagnosis not present

## 2023-09-10 DIAGNOSIS — I5033 Acute on chronic diastolic (congestive) heart failure: Secondary | ICD-10-CM | POA: Diagnosis not present

## 2023-09-10 DIAGNOSIS — S32038D Other fracture of third lumbar vertebra, subsequent encounter for fracture with routine healing: Secondary | ICD-10-CM | POA: Diagnosis not present

## 2023-09-10 DIAGNOSIS — Z9181 History of falling: Secondary | ICD-10-CM | POA: Diagnosis not present

## 2023-09-10 DIAGNOSIS — I4891 Unspecified atrial fibrillation: Secondary | ICD-10-CM | POA: Diagnosis not present

## 2023-09-10 DIAGNOSIS — J9601 Acute respiratory failure with hypoxia: Secondary | ICD-10-CM | POA: Diagnosis not present

## 2023-09-10 DIAGNOSIS — G4700 Insomnia, unspecified: Secondary | ICD-10-CM | POA: Diagnosis not present

## 2023-09-10 DIAGNOSIS — E669 Obesity, unspecified: Secondary | ICD-10-CM | POA: Diagnosis not present

## 2023-09-10 DIAGNOSIS — Z6833 Body mass index (BMI) 33.0-33.9, adult: Secondary | ICD-10-CM | POA: Diagnosis not present

## 2023-09-10 DIAGNOSIS — N390 Urinary tract infection, site not specified: Secondary | ICD-10-CM | POA: Diagnosis not present

## 2023-09-10 DIAGNOSIS — R296 Repeated falls: Secondary | ICD-10-CM | POA: Diagnosis not present

## 2023-09-10 DIAGNOSIS — G4733 Obstructive sleep apnea (adult) (pediatric): Secondary | ICD-10-CM | POA: Diagnosis not present

## 2023-09-10 DIAGNOSIS — I82403 Acute embolism and thrombosis of unspecified deep veins of lower extremity, bilateral: Secondary | ICD-10-CM | POA: Diagnosis not present

## 2023-09-10 DIAGNOSIS — Z7901 Long term (current) use of anticoagulants: Secondary | ICD-10-CM | POA: Diagnosis not present

## 2023-09-10 DIAGNOSIS — G8921 Chronic pain due to trauma: Secondary | ICD-10-CM | POA: Diagnosis not present

## 2023-09-10 DIAGNOSIS — E7800 Pure hypercholesterolemia, unspecified: Secondary | ICD-10-CM | POA: Diagnosis not present

## 2023-09-10 DIAGNOSIS — Z556 Problems related to health literacy: Secondary | ICD-10-CM | POA: Diagnosis not present

## 2023-09-10 DIAGNOSIS — I110 Hypertensive heart disease with heart failure: Secondary | ICD-10-CM | POA: Diagnosis not present

## 2023-09-10 DIAGNOSIS — F419 Anxiety disorder, unspecified: Secondary | ICD-10-CM | POA: Diagnosis not present

## 2023-09-10 DIAGNOSIS — M51369 Other intervertebral disc degeneration, lumbar region without mention of lumbar back pain or lower extremity pain: Secondary | ICD-10-CM | POA: Diagnosis not present

## 2023-09-10 DIAGNOSIS — E1142 Type 2 diabetes mellitus with diabetic polyneuropathy: Secondary | ICD-10-CM | POA: Diagnosis not present

## 2023-09-10 DIAGNOSIS — I2699 Other pulmonary embolism without acute cor pulmonale: Secondary | ICD-10-CM | POA: Diagnosis not present

## 2023-09-12 DIAGNOSIS — J9601 Acute respiratory failure with hypoxia: Secondary | ICD-10-CM | POA: Diagnosis not present

## 2023-09-12 DIAGNOSIS — I4891 Unspecified atrial fibrillation: Secondary | ICD-10-CM | POA: Diagnosis not present

## 2023-09-12 DIAGNOSIS — I2699 Other pulmonary embolism without acute cor pulmonale: Secondary | ICD-10-CM | POA: Diagnosis not present

## 2023-09-12 DIAGNOSIS — I5033 Acute on chronic diastolic (congestive) heart failure: Secondary | ICD-10-CM | POA: Diagnosis not present

## 2023-09-12 DIAGNOSIS — I82403 Acute embolism and thrombosis of unspecified deep veins of lower extremity, bilateral: Secondary | ICD-10-CM | POA: Diagnosis not present

## 2023-09-12 DIAGNOSIS — R0602 Shortness of breath: Secondary | ICD-10-CM | POA: Diagnosis not present

## 2023-09-12 DIAGNOSIS — I110 Hypertensive heart disease with heart failure: Secondary | ICD-10-CM | POA: Diagnosis not present

## 2023-09-13 DIAGNOSIS — I4891 Unspecified atrial fibrillation: Secondary | ICD-10-CM | POA: Diagnosis not present

## 2023-09-13 DIAGNOSIS — I5033 Acute on chronic diastolic (congestive) heart failure: Secondary | ICD-10-CM | POA: Diagnosis not present

## 2023-09-13 DIAGNOSIS — I110 Hypertensive heart disease with heart failure: Secondary | ICD-10-CM | POA: Diagnosis not present

## 2023-09-13 DIAGNOSIS — J9601 Acute respiratory failure with hypoxia: Secondary | ICD-10-CM | POA: Diagnosis not present

## 2023-09-13 DIAGNOSIS — I82403 Acute embolism and thrombosis of unspecified deep veins of lower extremity, bilateral: Secondary | ICD-10-CM | POA: Diagnosis not present

## 2023-09-13 DIAGNOSIS — I2699 Other pulmonary embolism without acute cor pulmonale: Secondary | ICD-10-CM | POA: Diagnosis not present

## 2023-09-14 ENCOUNTER — Encounter (INDEPENDENT_AMBULATORY_CARE_PROVIDER_SITE_OTHER): Payer: Self-pay | Admitting: Vascular Surgery

## 2023-09-14 ENCOUNTER — Ambulatory Visit (INDEPENDENT_AMBULATORY_CARE_PROVIDER_SITE_OTHER): Admitting: Vascular Surgery

## 2023-09-14 ENCOUNTER — Ambulatory Visit (INDEPENDENT_AMBULATORY_CARE_PROVIDER_SITE_OTHER)

## 2023-09-14 VITALS — BP 125/77 | HR 92 | Resp 16

## 2023-09-14 DIAGNOSIS — I890 Lymphedema, not elsewhere classified: Secondary | ICD-10-CM | POA: Diagnosis not present

## 2023-09-14 DIAGNOSIS — E1149 Type 2 diabetes mellitus with other diabetic neurological complication: Secondary | ICD-10-CM | POA: Diagnosis not present

## 2023-09-14 DIAGNOSIS — I10 Essential (primary) hypertension: Secondary | ICD-10-CM

## 2023-09-14 DIAGNOSIS — M7989 Other specified soft tissue disorders: Secondary | ICD-10-CM

## 2023-09-14 NOTE — Progress Notes (Signed)
 Subjective:    [name], female    DOB: 1940/06/10, 83 y.o.   MRN: [id number] Chief Complaint  Patient presents with   Follow-up    4 week follow up Bilateral reflux     Anita Walker is an 83 yo female who returns to clinic today for re-evaluation of bilateral lower extremity leg swelling.  Patient was last seen here in the office in February 2025 earlier this year.  She did have significant edema at the time which had multifactoral issues contributing to her swelling.  In order to gain control of her swelling at that time so she could wear compression she was placed in bilateral Unna boots.  Her daughter who is with her today said that she did not tolerate those well as the upper level of the wrapping cut into her skin below her knees.  At that time she was living in her nursing facility and home health nursing was changing her wraps on a weekly basis.  I believe her daughter now states that she lives at home with someone helping to care for her.  The patient does not answer my questions directly but will answer questions from her daughter when her daughter asks them.  She is hard of hearing however.  She did undergo venous Doppler ultrasound studies this afternoon which were negative for reflux or any DVTs at this time.    Review of Systems  Constitutional: Negative.   Cardiovascular:  Positive for leg swelling.  Musculoskeletal:  Positive for gait problem.  All other systems reviewed and are negative.      Objective:   Physical Exam Vitals reviewed.  Constitutional:      Appearance: Normal appearance. She is obese.  HENT:     Head: Normocephalic.  Eyes:     Pupils: Pupils are equal, round, and reactive to light.  Cardiovascular:     Rate and Rhythm: Normal rate and regular rhythm.     Pulses: Normal pulses.     Heart sounds: Normal heart sounds.  Pulmonary:     Effort: Pulmonary effort is normal.     Breath sounds: Normal breath sounds.  Abdominal:      General: Abdomen is flat. Bowel sounds are normal.     Palpations: Abdomen is soft.  Musculoskeletal:        General: Swelling present.     Right lower leg: Edema present.     Left lower leg: Edema present.  Skin:    General: Skin is warm and dry.     Capillary Refill: Capillary refill takes more than 3 seconds.     Findings: Erythema present.  Neurological:     General: No focal deficit present.     Mental Status: She is alert and oriented to person, place, and time. Mental status is at baseline.     Motor: Weakness present.     Coordination: Coordination abnormal.     Gait: Gait abnormal.  Psychiatric:        Mood and Affect: Mood normal.        Behavior: Behavior normal.        Thought Content: Thought content normal.        Judgment: Judgment normal.     BP 125/77 (BP Location: Right Arm, Patient Position: Sitting, Cuff Size: Large)   Pulse 92   Resp 16   Past Medical History:  Diagnosis Date   Anemia    Arthritis    Atrial fibrillation (HCC)  Breast cancer (HCC)    s/p lumpectomy 1992.  s/p chemo and xrt left breast   Diabetes mellitus (HCC)    Edema    feet/legs   Gastric ulcer    GERD (gastroesophageal reflux disease)    HOH (hard of hearing)    aides   Hypercholesterolemia    Hypertension    Personal history of chemotherapy    Pulmonary emboli (HCC)     Social History   Socioeconomic History   Marital status: Widowed    Spouse name: Not on file   Number of children: Not on file   Years of education: Not on file   Highest education level: 10th grade  Occupational History   Not on file  Tobacco Use   Smoking status: Never   Smokeless tobacco: Never  Vaping Use   Vaping status: Never Used  Substance and Sexual Activity   Alcohol use: No    Alcohol/week: 0.0 standard drinks of alcohol   Drug use: No   Sexual activity: Never  Other Topics Concern   Not on file  Social History Narrative   Not on file   Social Drivers of Health   Financial  Resource Strain: Low Risk  (06/25/2023)   Overall Financial Resource Strain (CARDIA)    Difficulty of Paying Living Expenses: Not hard at all  Food Insecurity: No Food Insecurity (06/25/2023)   Hunger Vital Sign    Worried About Running Out of Food in the Last Year: Never true    Ran Out of Food in the Last Year: Never true  Transportation Needs: No Transportation Needs (06/25/2023)   PRAPARE - Administrator, Civil Service (Medical): No    Lack of Transportation (Non-Medical): No  Physical Activity: Unknown (06/25/2023)   Exercise Vital Sign    Days of Exercise per Week: 0 days    Minutes of Exercise per Session: Not on file  Stress: No Stress Concern Present (06/25/2023)   [employer] of Occupational Health - Occupational Stress Questionnaire    Feeling of Stress : Not at all  Social Connections: Socially Isolated (06/25/2023)   Social Connection and Isolation Panel    Frequency of Communication with Friends and Family: Never    Frequency of Social Gatherings with Friends and Family: Never    Attends Religious Services: Never    Database administrator or Organizations: No    Attends Banker Meetings: Never    Marital Status: Widowed  Intimate Partner Violence: Patient Unable To Answer (06/22/2023)   Humiliation, Afraid, Rape, and Kick questionnaire    Fear of Current or Ex-Partner: Patient unable to answer    Emotionally Abused: Patient unable to answer    Physically Abused: Patient unable to answer    Sexually Abused: Patient unable to answer    Past Surgical History:  Procedure Laterality Date   BREAST LUMPECTOMY  03/06/1990   left breast   CATARACT EXTRACTION W/PHACO Right 12/26/2016   Procedure: CATARACT EXTRACTION PHACO AND INTRAOCULAR LENS PLACEMENT (IOC)-RIGHT DIABETIC;  Surgeon: Jaye Fallow, MD;  Location: [facility] ORS;  Service: Ophthalmology;  Laterality: Right;  US  00:47 AP% 24.5 CDE 11.62 Fluid pack lot # [id number]   CATARACT EXTRACTION  W/PHACO Left 01/23/2017   Procedure: CATARACT EXTRACTION PHACO AND INTRAOCULAR LENS PLACEMENT (IOC);  Surgeon: Jaye Fallow, MD;  Location: [facility];  Service: Ophthalmology;  Laterality: Left;  US  00:50 AP% 16.1 CDE 8.18 Fluid pack lot #[id number]   COLONOSCOPY WITH PROPOFOL  N/A 07/08/2019  Procedure: COLONOSCOPY WITH PROPOFOL ;  Surgeon: Jinny Carmine, MD;  Location: [facility] ENDOSCOPY;  Service: Endoscopy;  Laterality: N/A;    Family History  Problem Relation Age of Onset   Heart disease Father    Heart disease Brother        s/p CABG   Breast cancer Daughter    Colon cancer Neg Hx     Allergies  Allergen Reactions   Penicillins Other (See Comments)    Unknown- pt states been a long time ago  Has patient had a PCN reaction causing immediate rash, facial/tongue/throat swelling, SOB or lightheadedness with hypotension: Unknown Has patient had a PCN reaction causing severe rash involving mucus membranes or skin necrosis: Unknown Has patient had a PCN reaction that required hospitalization: Unknown Has patient had a PCN reaction occurring within the last 10 years: Unknown If all of the above answers are NO, then may proceed with Cephalosporin use.   Penicillin V Potassium Nausea And Vomiting       Latest Ref Rng & Units 08/08/2023    1:53 PM 03/31/2023    4:45 AM 03/24/2023    7:42 AM  CBC  WBC 4.0 - 10.5 K/uL 6.7  5.8  3.5   Hemoglobin 12.0 - 15.0 g/dL 87.0  86.4  85.9   Hematocrit 36.0 - 46.0 % 38.6  38.5  40.9   Platelets 150.0 - 400.0 K/uL 267.0  204  178       CMP     Component Value Date/Time   NA 139 08/17/2023 1120   NA 136 07/16/2013 1413   K 4.7 08/17/2023 1120   K 3.6 07/16/2013 1413   CL 98 08/17/2023 1120   CL 104 07/16/2013 1413   CO2 21 08/17/2023 1120   CO2 24 07/16/2013 1413   GLUCOSE 105 (H) 08/17/2023 1120   GLUCOSE 104 (H) 08/08/2023 1353   GLUCOSE 128 (H) 07/16/2013 1413   BUN 16 08/17/2023 1120   BUN 10 07/16/2013 1413   CREATININE 0.82  08/17/2023 1120   CREATININE 0.99 07/16/2013 1413   CALCIUM 9.9 08/17/2023 1120   CALCIUM 9.2 07/16/2013 1413   PROT 6.6 03/19/2023 1100   PROT 6.5 03/25/2020 1657   PROT 7.6 04/25/2013 1538   ALBUMIN 4.2 03/19/2023 1100   ALBUMIN 4.0 03/25/2020 1657   ALBUMIN 3.9 04/25/2013 1538   AST 19 03/19/2023 1100   AST 19 04/25/2013 1538   ALT 19 03/19/2023 1100   ALT 28 04/25/2013 1538   ALKPHOS 71 03/19/2023 1100   ALKPHOS 44 (L) 04/25/2013 1538   BILITOT 0.4 03/19/2023 1100   BILITOT 0.4 03/25/2020 1657   BILITOT 0.4 04/25/2013 1538   GFR 57.58 (L) 08/08/2023 1353   EGFR 70 08/17/2023 1120   GFRNONAA >60 03/31/2023 0445   GFRNONAA 56 (L) 07/16/2013 1413     No results found.     Assessment & Plan:   1. Lymphedema (Primary) Patient presents today to clinic in follow-up from visit back in February 2025 where the patient was placed in bilateral lower extremity wraps to help with severe lymphedema.  Patient was being treated weekly while in a nursing facility.  She then transitioned back home and is living with a family member who is helping take care of her.  Lower extremity wraps had stopped so they had to follow-up as outpatient with us  today to continue some type of treatment for her lower extremity swelling.  I had a long discussion with the patient's daughter who is here with  her today who feels that she would be better off with compression socks or stockings rather than wraps.  I asked the patient directly which she would rather do which she deferred to her daughter.  She does have bilateral lower extremity swelling that is +3 to +4 today.  Her legs are not weeping but they are discolored and erythemic.  She does not have any ulcerations today.  Therefore I am okay with starting compression at this point to see what benefit she may have from this.  We also discussed in detail rest elevation and some exercise.  Daughter states that she really does not move much she goes from her  recliner back and forth to the bathroom and to the bedroom to sleep at night.  Family is assisting her with elevating her legs.  We discussed all this in detail so the patient and her daughter were understand what needs to be done to get any success with just wearing compression hose.  Both verbalized her understanding.  Patient underwent bilateral lower extremity venous ultrasounds today which were negative for reflux or DVTs at this time.  Plan going forward is to get the patient in compression and wear it daily from the time she gets up to the time she goes to bed.  Patient will follow-up with us  in 6 months or sooner if needed to see how compression is working for her.  I expressed to the daughter if her legs are getting worse i.e. start weeping or develop ulcers and open sores she needs to come back and see us  immediately and be wrapped.  Daughter verbalized her understanding.  2. Essential hypertension Continue antihypertensive medications as already ordered, these medications have been reviewed and there are no changes at this time.  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  Hgb A1C to be monitored as already arranged by primary service   Current Outpatient Medications on File Prior to Visit  Medication Sig Dispense Refill   acetaminophen  (TYLENOL ) 325 MG tablet Take by mouth.     alum & mag hydroxide-simeth (MAALOX/MYLANTA) 200-200-20 MG/5ML suspension Take 15 mLs by mouth every 6 (six) hours as needed for indigestion or heartburn. 355 mL 0   apixaban (ELIQUIS) 5 MG TABS tablet Take 5 mg by mouth 2 (two) times daily.     busPIRone  (BUSPAR ) 5 MG tablet Take 1 tablet (5 mg total) by mouth daily as needed. 30 tablet 1   diphenhydrAMINE  (BENADRYL ) 25 mg capsule Take 25 mg at bedtime as needed by mouth for sleep.      Docusate Calcium  (STOOL SOFTENER PO) Take by mouth.     escitalopram (LEXAPRO) 10 MG tablet Take 10 mg by mouth daily.     feeding supplement (ENSURE ENLIVE / ENSURE PLUS) LIQD Take 237 mLs by mouth 2 (two) times daily between meals. 237 mL 12   fluticasone  (FLONASE ) 50 MCG/ACT nasal spray Place 2 sprays into both nostrils daily.     glucose blood (CONTOUR TEST) test strip USE TO TEST BLOOD SUGAR ONCE DAILY 50 each 11   losartan  (COZAAR ) 50 MG tablet Take 50 mg by mouth daily.     Multiple Vitamin (MULTIVITAMIN WITH MINERALS) TABS tablet Take 1 tablet daily by mouth.     ondansetron  (ZOFRAN -ODT) 4 MG disintegrating tablet Take 1 tablet (4 mg total) by mouth every 8 (eight) hours as needed  for nausea or vomiting. 20 tablet 0   pantoprazole  (PROTONIX ) 40 MG tablet TAKE 1 TABLET(40 MG) BY MOUTH DAILY 90 tablet 3   simvastatin  (ZOCOR ) 10 MG tablet Take 1 tablet (10 mg total) by mouth daily. 30 tablet 2   torsemide (DEMADEX) 20 MG tablet Take 20 mg by mouth daily.     No current facility-administered medications on file prior to visit.    There are no Patient Instructions on file for this visit. No follow-ups on file.   Gwendlyn JONELLE Shank, NP

## 2023-09-19 DIAGNOSIS — J9601 Acute respiratory failure with hypoxia: Secondary | ICD-10-CM | POA: Diagnosis not present

## 2023-09-19 DIAGNOSIS — I5033 Acute on chronic diastolic (congestive) heart failure: Secondary | ICD-10-CM | POA: Diagnosis not present

## 2023-09-19 DIAGNOSIS — I82403 Acute embolism and thrombosis of unspecified deep veins of lower extremity, bilateral: Secondary | ICD-10-CM | POA: Diagnosis not present

## 2023-09-19 DIAGNOSIS — I2699 Other pulmonary embolism without acute cor pulmonale: Secondary | ICD-10-CM | POA: Diagnosis not present

## 2023-09-19 DIAGNOSIS — I4891 Unspecified atrial fibrillation: Secondary | ICD-10-CM | POA: Diagnosis not present

## 2023-09-19 DIAGNOSIS — I110 Hypertensive heart disease with heart failure: Secondary | ICD-10-CM | POA: Diagnosis not present

## 2023-09-24 DIAGNOSIS — I4891 Unspecified atrial fibrillation: Secondary | ICD-10-CM | POA: Diagnosis not present

## 2023-09-24 DIAGNOSIS — I5033 Acute on chronic diastolic (congestive) heart failure: Secondary | ICD-10-CM | POA: Diagnosis not present

## 2023-09-24 DIAGNOSIS — I2699 Other pulmonary embolism without acute cor pulmonale: Secondary | ICD-10-CM | POA: Diagnosis not present

## 2023-09-24 DIAGNOSIS — I82403 Acute embolism and thrombosis of unspecified deep veins of lower extremity, bilateral: Secondary | ICD-10-CM | POA: Diagnosis not present

## 2023-09-24 DIAGNOSIS — J9601 Acute respiratory failure with hypoxia: Secondary | ICD-10-CM | POA: Diagnosis not present

## 2023-09-24 DIAGNOSIS — I110 Hypertensive heart disease with heart failure: Secondary | ICD-10-CM | POA: Diagnosis not present

## 2023-09-24 DIAGNOSIS — G4733 Obstructive sleep apnea (adult) (pediatric): Secondary | ICD-10-CM | POA: Diagnosis not present

## 2023-09-25 DIAGNOSIS — Z1331 Encounter for screening for depression: Secondary | ICD-10-CM | POA: Diagnosis not present

## 2023-09-25 DIAGNOSIS — F02818 Dementia in other diseases classified elsewhere, unspecified severity, with other behavioral disturbance: Secondary | ICD-10-CM | POA: Diagnosis not present

## 2023-09-25 DIAGNOSIS — R269 Unspecified abnormalities of gait and mobility: Secondary | ICD-10-CM | POA: Diagnosis not present

## 2023-09-25 DIAGNOSIS — G309 Alzheimer's disease, unspecified: Secondary | ICD-10-CM | POA: Diagnosis not present

## 2023-09-25 DIAGNOSIS — G250 Essential tremor: Secondary | ICD-10-CM | POA: Diagnosis not present

## 2023-09-25 DIAGNOSIS — F01518 Vascular dementia, unspecified severity, with other behavioral disturbance: Secondary | ICD-10-CM | POA: Diagnosis not present

## 2023-09-27 ENCOUNTER — Encounter: Payer: Self-pay | Admitting: Internal Medicine

## 2023-09-27 DIAGNOSIS — F0150 Vascular dementia without behavioral disturbance: Secondary | ICD-10-CM | POA: Insufficient documentation

## 2023-10-02 DIAGNOSIS — I2699 Other pulmonary embolism without acute cor pulmonale: Secondary | ICD-10-CM | POA: Diagnosis not present

## 2023-10-02 DIAGNOSIS — I110 Hypertensive heart disease with heart failure: Secondary | ICD-10-CM | POA: Diagnosis not present

## 2023-10-02 DIAGNOSIS — J9601 Acute respiratory failure with hypoxia: Secondary | ICD-10-CM | POA: Diagnosis not present

## 2023-10-02 DIAGNOSIS — I4891 Unspecified atrial fibrillation: Secondary | ICD-10-CM | POA: Diagnosis not present

## 2023-10-02 DIAGNOSIS — I5033 Acute on chronic diastolic (congestive) heart failure: Secondary | ICD-10-CM | POA: Diagnosis not present

## 2023-10-02 DIAGNOSIS — I82403 Acute embolism and thrombosis of unspecified deep veins of lower extremity, bilateral: Secondary | ICD-10-CM | POA: Diagnosis not present

## 2023-10-02 DIAGNOSIS — I82412 Acute embolism and thrombosis of left femoral vein: Secondary | ICD-10-CM | POA: Diagnosis not present

## 2023-10-08 ENCOUNTER — Other Ambulatory Visit: Payer: Self-pay | Admitting: Internal Medicine

## 2023-10-11 ENCOUNTER — Encounter: Payer: Self-pay | Admitting: Internal Medicine

## 2023-10-11 ENCOUNTER — Ambulatory Visit: Admitting: Internal Medicine

## 2023-10-11 VITALS — BP 130/78 | HR 95 | Temp 97.8°F | Resp 20 | Ht 67.0 in | Wt 228.5 lb

## 2023-10-11 DIAGNOSIS — R9389 Abnormal findings on diagnostic imaging of other specified body structures: Secondary | ICD-10-CM

## 2023-10-11 DIAGNOSIS — G319 Degenerative disease of nervous system, unspecified: Secondary | ICD-10-CM

## 2023-10-11 DIAGNOSIS — F0150 Vascular dementia without behavioral disturbance: Secondary | ICD-10-CM

## 2023-10-11 DIAGNOSIS — E041 Nontoxic single thyroid nodule: Secondary | ICD-10-CM | POA: Diagnosis not present

## 2023-10-11 DIAGNOSIS — D649 Anemia, unspecified: Secondary | ICD-10-CM | POA: Diagnosis not present

## 2023-10-11 DIAGNOSIS — E7800 Pure hypercholesterolemia, unspecified: Secondary | ICD-10-CM | POA: Diagnosis not present

## 2023-10-11 DIAGNOSIS — G4730 Sleep apnea, unspecified: Secondary | ICD-10-CM

## 2023-10-11 DIAGNOSIS — F419 Anxiety disorder, unspecified: Secondary | ICD-10-CM | POA: Diagnosis not present

## 2023-10-11 DIAGNOSIS — K769 Liver disease, unspecified: Secondary | ICD-10-CM

## 2023-10-11 DIAGNOSIS — I10 Essential (primary) hypertension: Secondary | ICD-10-CM | POA: Diagnosis not present

## 2023-10-11 DIAGNOSIS — E1149 Type 2 diabetes mellitus with other diabetic neurological complication: Secondary | ICD-10-CM

## 2023-10-11 DIAGNOSIS — R600 Localized edema: Secondary | ICD-10-CM | POA: Diagnosis not present

## 2023-10-11 DIAGNOSIS — E1142 Type 2 diabetes mellitus with diabetic polyneuropathy: Secondary | ICD-10-CM | POA: Diagnosis not present

## 2023-10-11 DIAGNOSIS — Z853 Personal history of malignant neoplasm of breast: Secondary | ICD-10-CM

## 2023-10-11 DIAGNOSIS — G309 Alzheimer's disease, unspecified: Secondary | ICD-10-CM

## 2023-10-11 LAB — HEMOGLOBIN A1C: Hgb A1c MFr Bld: 5.9 % (ref 4.6–6.5)

## 2023-10-11 LAB — TSH: TSH: 4.06 u[IU]/mL (ref 0.35–5.50)

## 2023-10-11 MED ORDER — LOSARTAN POTASSIUM 25 MG PO TABS: 25.0000 mg | ORAL_TABLET | Freq: Every day | ORAL | 1 refills | Status: AC

## 2023-10-11 NOTE — Progress Notes (Signed)
 Subjective:    [name], female    DOB: 08/16/1940, 83 y.o.   MRN: [id number]  Patient here for  Chief Complaint  Patient presents with   Medical Management of Chronic Issues    2 month follow up    HPI Here for a scheduled follow up - follow up regarding diabetes, hypertension and hypercholesterolemia. Saw cardiology 08/24/23 - f/u edema of lower extremities, sob. Recommended torsemide increased 40mg  bid for three days then return to normal dose. Saw [facility] 09/14/23 - venous ultrasounds - negative for reflux of DVTs. Recommended compression hose. Saw neurology 09/25/23- mixed dementia. Recommended to start seroquel. Just started this week. Tolerating. Saw hematology 10/02/23 - recommended to continue eliquis 5mg  bid. Discussed the need to wear compression hose. Also discussed the need for continued exercise and getting up and walking around - with her walker. Breathing overall stable. Cpap has been ordered - sleep apnea. She is accompanied by her daughter and her caretaker. Report that while [name] is sleeping, they have noticed some jerking movements. They were questioning if she could be having seizures. Do not notice at any other time. They do not try to arouse her. Discussed sleep apnea and possible restlessness related to this. Will d/w neurology.    Past Medical History:  Diagnosis Date   Anemia    Anxiety    Arthritis    Atrial fibrillation (HCC)    Breast cancer (HCC)    s/p lumpectomy 1992.  s/p chemo and xrt left breast   CHF (congestive heart failure) (HCC) 1/25   Clotting disorder (HCC)    Depression 06/22   Diabetes mellitus (HCC)    Edema    feet/legs   Emphysema of lung (HCC) 2025   Gastric ulcer    GERD (gastroesophageal reflux disease)    HOH (hard of hearing)    aides   Hypercholesterolemia    Hypertension    Myocardial infarction [facility]) April 2025   Personal history of chemotherapy    Pulmonary emboli (HCC)    Sleep apnea    Thyroid  disease  2025   Past Surgical History:  Procedure Laterality Date   BREAST LUMPECTOMY  03/06/1990   left breast   CATARACT EXTRACTION W/PHACO Right 12/26/2016   Procedure: CATARACT EXTRACTION PHACO AND INTRAOCULAR LENS PLACEMENT (IOC)-RIGHT DIABETIC;  Surgeon: Jaye Fallow, MD;  Location: [facility] ORS;  Service: Ophthalmology;  Laterality: Right;  US  00:47 AP% 24.5 CDE 11.62 Fluid pack lot # [id number]   CATARACT EXTRACTION W/PHACO Left 01/23/2017   Procedure: CATARACT EXTRACTION PHACO AND INTRAOCULAR LENS PLACEMENT (IOC);  Surgeon: Jaye Fallow, MD;  Location: [facility] ORS;  Service: Ophthalmology;  Laterality: Left;  US  00:50 AP% 16.1 CDE 8.18 Fluid pack lot #[id number]   COLONOSCOPY WITH PROPOFOL  N/A 07/08/2019   Procedure: COLONOSCOPY WITH PROPOFOL ;  Surgeon: Jinny Carmine, MD;  Location: [facility] ENDOS[facility];  Service: Endoscopy;  Laterality: N/A;   Family History  Problem Relation Age of Onset   Heart disease Father    Heart disease Brother        s/p CABG   Breast cancer Daughter    Colon cancer Neg Hx    Social History   Socioeconomic History   Marital status: Widowed    Spouse name: Not on file   Number of children: Not on file   Years of education: Not on file   Highest education level: 10th grade  Occupational History   Not on file  Tobacco Use   Smoking  status: Never   Smokeless tobacco: Never  Vaping Use   Vaping status: Never Used  Substance and Sexual Activity   Alcohol use: No    Alcohol/week: 0.0 standard drinks of alcohol   Drug use: No   Sexual activity: Never  Other Topics Concern   Not on file  Social History Narrative   Not on file   Social Drivers of Health   Financial Resource Strain: Low Risk  (10/09/2023)   Overall Financial Resource Strain (CARDIA)    Difficulty of Paying Living Expenses: Not hard at all  Food Insecurity: No Food Insecurity (10/09/2023)   Hunger Vital Sign    Worried About Running Out of Food in the Last Year: Never true    Ran Out of Food  in the Last Year: Never true  Transportation Needs: No Transportation Needs (10/09/2023)   PRAPARE - Administrator, Civil Service (Medical): No    Lack of Transportation (Non-Medical): No  Physical Activity: Inactive (10/09/2023)   Exercise Vital Sign    Days of Exercise per Week: 0 days    Minutes of Exercise per Session: Not on file  Stress: Stress Concern Present (10/09/2023)   [employer] of Occupational Health - Occupational Stress Questionnaire    Feeling of Stress: To some extent  Social Connections: Socially Isolated (10/09/2023)   Social Connection and Isolation Panel    Frequency of Communication with Friends and Family: Once a week    Frequency of Social Gatherings with Friends and Family: Never    Attends Religious Services: Never    Database administrator or Organizations: No    Attends Engineer, structural: Not on file    Marital Status: Widowed     Review of Systems  Constitutional:  Negative for appetite change and unexpected weight change.  HENT:  Negative for congestion and sinus pressure.   Respiratory:  Negative for cough and chest tightness.        Breathing stable.   Cardiovascular:  Negative for chest pain and palpitations.       Seeing [facility] for leg swelling.   Gastrointestinal:  Negative for abdominal pain, diarrhea, nausea and vomiting.  Genitourinary:  Negative for difficulty urinating and dysuria.  Musculoskeletal:  Negative for joint swelling and myalgias.  Skin:  Negative for color change and rash.  Neurological:  Negative for dizziness and headaches.  Psychiatric/Behavioral:  Negative for agitation and dysphoric mood.        Objective:     BP 130/78   Pulse 95   Temp 97.8 F (36.6 C)   Resp 20   Ht 5' 7 (1.702 m)   Wt 228 lb 8 oz (103.6 kg)   SpO2 93%   BMI 35.79 kg/m  Wt Readings from Last 3 Encounters:  10/11/23 228 lb 8 oz (103.6 kg)  08/17/23 231 lb 3.2 oz (104.9 kg)  08/08/23 225 lb (102.1 kg)    Physical  Exam Vitals reviewed.  Constitutional:      General: She is not in acute distress.    Appearance: Normal appearance.  HENT:     Head: Normocephalic and atraumatic.     Right Ear: External ear normal.     Left Ear: External ear normal.     Mouth/Throat:     Pharynx: No oropharyngeal exudate or posterior oropharyngeal erythema.  Eyes:     General: No scleral icterus.       Right eye: No discharge.  Left eye: No discharge.     Conjunctiva/sclera: Conjunctivae normal.  Neck:     Thyroid : No thyromegaly.  Cardiovascular:     Rate and Rhythm: Normal rate and regular rhythm.  Pulmonary:     Effort: No respiratory distress.     Breath sounds: Normal breath sounds. No wheezing.  Abdominal:     General: Bowel sounds are normal.     Palpations: Abdomen is soft.     Tenderness: There is no abdominal tenderness.  Musculoskeletal:        General: No tenderness.     Cervical back: Neck supple. No tenderness.     Comments: Chronic lower extremity swelling - stasis changes present.   Lymphadenopathy:     Cervical: No cervical adenopathy.  Skin:    Findings: No erythema or rash.  Neurological:     Mental Status: She is alert.  Psychiatric:        Mood and Affect: Mood normal.        Behavior: Behavior normal.         Outpatient Encounter Medications as of 10/11/2023  Medication Sig   acetaminophen  (TYLENOL ) 325 MG tablet Take by mouth.   alum & mag hydroxide-simeth (MAALOX/MYLANTA) 200-200-20 MG/5ML suspension Take 15 mLs by mouth every 6 (six) hours as needed for indigestion or heartburn.   apixaban (ELIQUIS) 5 MG TABS tablet Take 5 mg by mouth 2 (two) times daily.   busPIRone  (BUSPAR ) 5 MG tablet Take 1 tablet (5 mg total) by mouth daily as needed.   diphenhydrAMINE  (BENADRYL ) 25 mg capsule Take 25 mg at bedtime as needed by mouth for sleep.    Docusate Calcium (STOOL SOFTENER PO) Take by mouth.   escitalopram (LEXAPRO) 10 MG tablet Take 10 mg by mouth daily.   feeding  supplement (ENSURE ENLIVE / ENSURE PLUS) LIQD Take 237 mLs by mouth 2 (two) times daily between meals.   fluticasone  (FLONASE ) 50 MCG/ACT nasal spray Place 2 sprays into both nostrils daily.   glucose blood (CONTOUR TEST) test strip USE TO TEST BLOOD SUGAR ONCE DAILY   losartan  (COZAAR ) 25 MG tablet Take 1 tablet (25 mg total) by mouth daily.   Multiple Vitamin (MULTIVITAMIN WITH MINERALS) TABS tablet Take 1 tablet daily by mouth.   ondansetron  (ZOFRAN -ODT) 4 MG disintegrating tablet Take 1 tablet (4 mg total) by mouth every 8 (eight) hours as needed for nausea or vomiting.   pantoprazole  (PROTONIX ) 40 MG tablet TAKE 1 TABLET(40 MG) BY MOUTH DAILY   QUEtiapine (SEROQUEL) 25 MG tablet Take 25 mg by mouth at bedtime.   simvastatin  (ZOCOR ) 10 MG tablet Take 1 tablet (10 mg total) by mouth daily.   torsemide (DEMADEX) 20 MG tablet Take 20 mg by mouth daily.   [DISCONTINUED] losartan  (COZAAR ) 50 MG tablet Take 50 mg by mouth daily.   No facility-administered encounter medications on file as of 10/11/2023.     Lab Results  Component Value Date   WBC 6.7 08/08/2023   HGB 12.9 08/08/2023   HCT 38.6 08/08/2023   PLT 267.0 08/08/2023   GLUCOSE 103 (H) 10/11/2023   CHOL 139 10/11/2023   TRIG 84.0 10/11/2023   HDL 66.50 10/11/2023   LDLCALC 56 10/11/2023   ALT 8 10/11/2023   AST 13 10/11/2023   NA 136 10/11/2023   K 4.6 10/11/2023   CL 94 (L) 10/11/2023   CREATININE 0.78 10/11/2023   BUN 18 10/11/2023   CO2 30 10/11/2023   TSH 4.06 10/11/2023   INR  1.2 (H) 07/22/2020   HGBA1C 5.9 10/11/2023   MICROALBUR 0.8 10/11/2023    CT Head Wo Contrast Result Date: 08/02/2023 CLINICAL DATA:  Head trauma, minor (Age >= 65y); Neck trauma (Age >= 65y). Fall. Head and neck pain. On blood thinners. EXAM: CT HEAD WITHOUT CONTRAST CT CERVICAL SPINE WITHOUT CONTRAST TECHNIQUE: Multidetector CT imaging of the head and cervical spine was performed following the standard protocol without intravenous contrast.  Multiplanar CT image reconstructions of the cervical spine were also generated. RADIATION DOSE REDUCTION: This exam was performed according to the departmental dose-optimization program which includes automated exposure control, adjustment of the mA and/or kV according to patient size and/or use of iterative reconstruction technique. COMPARISON:  Cervical spine CT 04/15/2020. Head CT and MRI 03/23/2023. FINDINGS: CT HEAD FINDINGS Brain: There is no evidence of an acute infarct, intracranial hemorrhage, mass, midline shift, or extra-axial fluid collection. Patchy and confluent hypodensities in the cerebral white matter are unchanged and nonspecific but compatible with moderate to severe chronic small vessel ischemic disease. There is mild cerebral atrophy. Vascular: Calcified atherosclerosis at the skull base. No hyperdense vessel. Skull: No fracture or suspicious lesion. Sinuses/Orbits: Minimal mucosal thickening or fluid in the right sphenoid sinus. Clear mastoid air cells. Bilateral cataract extraction. Other: None. CT CERVICAL SPINE FINDINGS Alignment: Chronic straightening of the normal cervical lordosis with trace anterolisthesis of C3 on C4 and C4 on C5. Skull base and vertebrae: No acute fracture or suspicious lesion. Chronic compression deformities of the C7, T1, and T2 vertebral bodies. Soft tissues and spinal canal: No prevertebral fluid or swelling. No visible canal hematoma. Disc levels: Multilevel disc degeneration with disc space height loss being most severe at C5-6. Widespread facet arthrosis, asymmetrically advanced on the left at C3-4 and C4-5. Facet ankylosis at C2-3. Moderate right neural foraminal stenosis at C5-6 due to uncovertebral spurring. Upper chest: Focal band-like opacity in the posterior left upper lobe suggestive of scarring or atelectasis. Other: None. IMPRESSION: 1. No evidence of acute intracranial abnormality or cervical spine fracture. 2. Moderate to severe chronic small vessel  ischemic disease. Electronically Signed   By: Dasie Hamburg M.D.   On: 08/02/2023 12:05   CT Cervical Spine Wo Contrast Result Date: 08/02/2023 CLINICAL DATA:  Head trauma, minor (Age >= 65y); Neck trauma (Age >= 65y). Fall. Head and neck pain. On blood thinners. EXAM: CT HEAD WITHOUT CONTRAST CT CERVICAL SPINE WITHOUT CONTRAST TECHNIQUE: Multidetector CT imaging of the head and cervical spine was performed following the standard protocol without intravenous contrast. Multiplanar CT image reconstructions of the cervical spine were also generated. RADIATION DOSE REDUCTION: This exam was performed according to the departmental dose-optimization program which includes automated exposure control, adjustment of the mA and/or kV according to patient size and/or use of iterative reconstruction technique. COMPARISON:  Cervical spine CT 04/15/2020. Head CT and MRI 03/23/2023. FINDINGS: CT HEAD FINDINGS Brain: There is no evidence of an acute infarct, intracranial hemorrhage, mass, midline shift, or extra-axial fluid collection. Patchy and confluent hypodensities in the cerebral white matter are unchanged and nonspecific but compatible with moderate to severe chronic small vessel ischemic disease. There is mild cerebral atrophy. Vascular: Calcified atherosclerosis at the skull base. No hyperdense vessel. Skull: No fracture or suspicious lesion. Sinuses/Orbits: Minimal mucosal thickening or fluid in the right sphenoid sinus. Clear mastoid air cells. Bilateral cataract extraction. Other: None. CT CERVICAL SPINE FINDINGS Alignment: Chronic straightening of the normal cervical lordosis with trace anterolisthesis of C3 on C4 and C4 on C5. Skull  base and vertebrae: No acute fracture or suspicious lesion. Chronic compression deformities of the C7, T1, and T2 vertebral bodies. Soft tissues and spinal canal: No prevertebral fluid or swelling. No visible canal hematoma. Disc levels: Multilevel disc degeneration with disc space height  loss being most severe at C5-6. Widespread facet arthrosis, asymmetrically advanced on the left at C3-4 and C4-5. Facet ankylosis at C2-3. Moderate right neural foraminal stenosis at C5-6 due to uncovertebral spurring. Upper chest: Focal band-like opacity in the posterior left upper lobe suggestive of scarring or atelectasis. Other: None. IMPRESSION: 1. No evidence of acute intracranial abnormality or cervical spine fracture. 2. Moderate to severe chronic small vessel ischemic disease. Electronically Signed   By: Dasie Hamburg M.D.   On: 08/02/2023 12:05   DG Chest 2 View Result Date: 08/02/2023 CLINICAL DATA:  Fall EXAM: CHEST - 2 VIEW COMPARISON:  Chest x-ray performed March 23, 2023 FINDINGS: No pneumothorax or pleural effusion. Mild interstitial prominence. Heart mediastinum are not significantly changed. Hiatal hernia. Degenerative changes in the imaged osseous structures with underlying changes of osteopenia. IMPRESSION: 1. No pneumothorax. 2. Hiatal hernia. Electronically Signed   By: Maude Naegeli M.D.   On: 08/02/2023 11:40       Assessment & Plan:  Hypercholesteremia Assessment & Plan: On simvastatin .  Low cholesterol diet and exercise.  Follow lipid panel and liver function tests. No change today.  Lab Results  Component Value Date   CHOL 139 10/11/2023   HDL 66.50 10/11/2023   LDLCALC 56 10/11/2023   TRIG 84.0 10/11/2023   CHOLHDL 2 10/11/2023     Orders: -     Hepatic function panel -     Lipid panel -     TSH  Type 2 diabetes mellitus with neurological complications (HCC) Assessment & Plan: Low carb diet and exercise as tolerated.  Follow met b and a1c. No changes today.  Lab Results  Component Value Date   HGBA1C 5.9 10/11/2023     Orders: -     Basic metabolic panel with GFR -     Hemoglobin A1c -     Microalbumin / creatinine urine ratio  Mixed dementia [facility]) Assessment & Plan: Evaluated by Dr Maree - 09/2023. - recommended to start seroquel. Started this week.  Tolerating.    Abnormal chest CT Assessment & Plan: CT 06/2023 -  Examination positive for extensive bilateral pulmonary emboli with enlarged pulmonary artery suggesting pulmonary hypertension but no CT signs of right heart strain.  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  Large hiatal hernia. Continues on eliquis. Breathing stable.    Anemia, unspecified type Assessment & Plan: Follow cbc.    Anxiety Assessment & Plan: Continues on lexapro. Started on seroquel by neurology. Follow.    Bilateral leg edema Assessment & Plan:  Saw cardiology 08/24/23 - f/u edema of lower extremities, sob. Recommended torsemide increased 40mg  bid for three days then return to normal dose. Saw [facility] 09/14/23 - venous ultrasounds - negative for reflux of DVTs. Recommended compression hose.   Cerebral atrophy [facility]) Assessment & Plan: Noted on CT scan. Saw neurology. Mixed dementia. Started on seroquel. Tolerating.    Diabetic polyneuropathy associated with type 2 diabetes mellitus (HCC) Assessment & Plan: Sugars have been controlled.  Continue low carb diet and exercise.  Follow met b and A1c. No changes today.  Lab Results  Component Value Date   HGBA1C 5.9 10/11/2023      Essential hypertension Assessment & Plan: Continue losartan . Follow pressures.  Follow metabolic panel.    History of breast cancer Assessment & Plan: Mammogram 01/03/23 - Birads I.    Liver lesion Assessment & Plan: Found during recent CT - during hospitalization. Plan for f/u MRI abdomen. Will see if we can get her stronger and plan f/u with [facility] and neurology as outlined.    Thyroid  nodule Assessment & Plan: Noticed on scan 03/2023. Thyroid  ultrasound 08/2023 - Bilateral thyroid  nodules as described above. Recommend repeat ultrasound in 1 year.    Sleep apnea, unspecified type Assessment & Plan: Diagnosed with sleep apnea. Waiting for cpap machine. Caretaker/family concerned regarding some of her  jerking movements when sleeping. Does not notice at other times. They were questioning the possibility of seizures. Discussed sleep apnea and untreated sleep apnea. May be restlessness due to this. Discussed f/u with neurology.    Other orders -     Losartan  Potassium; Take 1 tablet (25 mg total) by mouth daily.  Dispense: 90 tablet; Refill: 1     Allena Hamilton, MD

## 2023-10-12 LAB — BASIC METABOLIC PANEL WITH GFR
BUN: 18 mg/dL (ref 6–23)
CO2: 30 meq/L (ref 19–32)
Calcium: 9.6 mg/dL (ref 8.4–10.5)
Chloride: 94 meq/L — ABNORMAL LOW (ref 96–112)
Creatinine, Ser: 0.78 mg/dL (ref 0.40–1.20)
GFR: 69.19 mL/min (ref >60.00)
Glucose, Bld: 103 mg/dL — ABNORMAL HIGH (ref 70–99)
Potassium: 4.6 meq/L (ref 3.5–5.1)
Sodium: 136 meq/L (ref 135–145)

## 2023-10-12 LAB — HEPATIC FUNCTION PANEL
ALT: 8 U/L (ref 0–35)
AST: 13 U/L (ref 0–37)
Albumin: 3.8 g/dL (ref 3.5–5.2)
Alkaline Phosphatase: 55 U/L (ref 39–117)
Bilirubin, Direct: 0.1 mg/dL (ref 0.0–0.3)
Total Bilirubin: 0.3 mg/dL (ref 0.2–1.2)
Total Protein: 6.2 g/dL (ref 6.0–8.3)

## 2023-10-12 LAB — LIPID PANEL
Cholesterol: 139 mg/dL (ref 0–200)
HDL: 66.5 mg/dL (ref >39.00)
LDL Cholesterol: 56 mg/dL (ref 0–99)
NonHDL: 72.64
Total CHOL/HDL Ratio: 2
Triglycerides: 84 mg/dL (ref 0.0–149.0)
VLDL: 16.8 mg/dL (ref 0.0–40.0)

## 2023-10-12 LAB — MICROALBUMIN / CREATININE URINE RATIO
Creatinine,U: 91.4 mg/dL
Microalb Creat Ratio: 9.1 mg/g (ref 0.0–30.0)
Microalb, Ur: 0.8 mg/dL (ref 0.0–1.9)

## 2023-10-14 ENCOUNTER — Ambulatory Visit: Payer: Self-pay | Admitting: Internal Medicine

## 2023-10-14 ENCOUNTER — Encounter: Payer: Self-pay | Admitting: Internal Medicine

## 2023-10-14 DIAGNOSIS — G4730 Sleep apnea, unspecified: Secondary | ICD-10-CM | POA: Insufficient documentation

## 2023-10-14 NOTE — Assessment & Plan Note (Signed)
 Evaluated by Dr Maree - 09/2023. - recommended to start seroquel. Started this week. Tolerating.

## 2023-10-14 NOTE — Assessment & Plan Note (Signed)
 On simvastatin .  Low cholesterol diet and exercise.  Follow lipid panel and liver function tests. No change today.  Lab Results  Component Value Date   CHOL 139 10/11/2023   HDL 66.50 10/11/2023   LDLCALC 56 10/11/2023   TRIG 84.0 10/11/2023   CHOLHDL 2 10/11/2023

## 2023-10-14 NOTE — Assessment & Plan Note (Signed)
 Mammogram 01/03/23 - Birads I.

## 2023-10-14 NOTE — Assessment & Plan Note (Signed)
 Follow cbc.

## 2023-10-14 NOTE — Assessment & Plan Note (Signed)
 Continues on lexapro. Started on seroquel by neurology. Follow.

## 2023-10-14 NOTE — Assessment & Plan Note (Signed)
 Low carb diet and exercise as tolerated.  Follow met b and a1c. No changes today.  Lab Results  Component Value Date   HGBA1C 5.9 10/11/2023

## 2023-10-14 NOTE — Assessment & Plan Note (Signed)
 Saw cardiology 08/24/23 - f/u edema of lower extremities, sob. Recommended torsemide increased 40mg  bid for three days then return to normal dose. Saw [facility] 09/14/23 - venous ultrasounds - negative for reflux of DVTs. Recommended compression hose.

## 2023-10-14 NOTE — Assessment & Plan Note (Signed)
 Noted on CT scan. Saw neurology. Mixed dementia. Started on seroquel. Tolerating.

## 2023-10-14 NOTE — Assessment & Plan Note (Signed)
 Diagnosed with sleep apnea. Waiting for cpap machine. Caretaker/family concerned regarding some of her jerking movements when sleeping. Does not notice at other times. They were questioning the possibility of seizures. Discussed sleep apnea and untreated sleep apnea. May be restlessness due to this. Discussed f/u with neurology.

## 2023-10-14 NOTE — Assessment & Plan Note (Signed)
 CT 06/2023 -  Examination positive for extensive bilateral pulmonary emboli with enlarged pulmonary artery suggesting pulmonary hypertension but no CT signs of right heart strain.  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  Large hiatal hernia. Continues on eliquis. Breathing stable.

## 2023-10-14 NOTE — Assessment & Plan Note (Signed)
 Found during recent CT - during hospitalization. Plan for f/u MRI abdomen. Will see if we can get her stronger and plan f/u with [facility] and neurology as outlined.

## 2023-10-14 NOTE — Assessment & Plan Note (Signed)
Continue losartan.  Follow pressures.  Follow metabolic panel.  

## 2023-10-14 NOTE — Assessment & Plan Note (Signed)
 Sugars have been controlled.  Continue low carb diet and exercise.  Follow met b and A1c. No changes today.  Lab Results  Component Value Date   HGBA1C 5.9 10/11/2023

## 2023-10-14 NOTE — Assessment & Plan Note (Signed)
 Noticed on scan 03/2023. Thyroid  ultrasound 08/2023 - Bilateral thyroid  nodules as described above. Recommend repeat ultrasound in 1 year.

## 2023-10-16 ENCOUNTER — Encounter: Payer: Self-pay | Admitting: Internal Medicine

## 2023-10-16 DIAGNOSIS — J4520 Mild intermittent asthma, uncomplicated: Secondary | ICD-10-CM | POA: Diagnosis not present

## 2023-10-16 DIAGNOSIS — J45909 Unspecified asthma, uncomplicated: Secondary | ICD-10-CM | POA: Insufficient documentation

## 2023-10-17 ENCOUNTER — Other Ambulatory Visit: Payer: Self-pay | Admitting: Internal Medicine

## 2023-10-18 ENCOUNTER — Ambulatory Visit: Admitting: Internal Medicine

## 2023-10-30 ENCOUNTER — Other Ambulatory Visit: Payer: Self-pay | Admitting: Internal Medicine

## 2023-11-06 ENCOUNTER — Ambulatory Visit: Admitting: Internal Medicine

## 2023-12-06 DIAGNOSIS — I5022 Chronic systolic (congestive) heart failure: Secondary | ICD-10-CM | POA: Diagnosis not present

## 2023-12-06 DIAGNOSIS — E1142 Type 2 diabetes mellitus with diabetic polyneuropathy: Secondary | ICD-10-CM | POA: Diagnosis not present

## 2023-12-06 DIAGNOSIS — I10 Essential (primary) hypertension: Secondary | ICD-10-CM | POA: Diagnosis not present

## 2023-12-06 DIAGNOSIS — G4733 Obstructive sleep apnea (adult) (pediatric): Secondary | ICD-10-CM | POA: Diagnosis not present

## 2023-12-06 DIAGNOSIS — E7800 Pure hypercholesterolemia, unspecified: Secondary | ICD-10-CM | POA: Diagnosis not present

## 2023-12-06 DIAGNOSIS — Z6835 Body mass index (BMI) 35.0-35.9, adult: Secondary | ICD-10-CM | POA: Diagnosis not present

## 2023-12-06 DIAGNOSIS — Z853 Personal history of malignant neoplasm of breast: Secondary | ICD-10-CM | POA: Diagnosis not present

## 2023-12-06 DIAGNOSIS — R600 Localized edema: Secondary | ICD-10-CM | POA: Diagnosis not present

## 2023-12-06 DIAGNOSIS — Z7901 Long term (current) use of anticoagulants: Secondary | ICD-10-CM | POA: Diagnosis not present

## 2023-12-06 DIAGNOSIS — E119 Type 2 diabetes mellitus without complications: Secondary | ICD-10-CM | POA: Diagnosis not present

## 2023-12-06 DIAGNOSIS — K219 Gastro-esophageal reflux disease without esophagitis: Secondary | ICD-10-CM | POA: Diagnosis not present

## 2023-12-06 DIAGNOSIS — E66812 Obesity, class 2: Secondary | ICD-10-CM | POA: Diagnosis not present

## 2023-12-11 ENCOUNTER — Other Ambulatory Visit: Payer: Self-pay | Admitting: Internal Medicine

## 2023-12-11 DIAGNOSIS — Z1231 Encounter for screening mammogram for malignant neoplasm of breast: Secondary | ICD-10-CM

## 2023-12-13 ENCOUNTER — Ambulatory Visit: Admitting: Podiatry

## 2023-12-13 DIAGNOSIS — M79675 Pain in left toe(s): Secondary | ICD-10-CM | POA: Diagnosis not present

## 2023-12-13 DIAGNOSIS — E119 Type 2 diabetes mellitus without complications: Secondary | ICD-10-CM | POA: Diagnosis not present

## 2023-12-13 DIAGNOSIS — M2041 Other hammer toe(s) (acquired), right foot: Secondary | ICD-10-CM

## 2023-12-13 DIAGNOSIS — M79674 Pain in right toe(s): Secondary | ICD-10-CM

## 2023-12-13 DIAGNOSIS — E1142 Type 2 diabetes mellitus with diabetic polyneuropathy: Secondary | ICD-10-CM | POA: Diagnosis not present

## 2023-12-13 DIAGNOSIS — M2042 Other hammer toe(s) (acquired), left foot: Secondary | ICD-10-CM | POA: Diagnosis not present

## 2023-12-13 DIAGNOSIS — B351 Tinea unguium: Secondary | ICD-10-CM

## 2023-12-13 DIAGNOSIS — Z0189 Encounter for other specified special examinations: Secondary | ICD-10-CM

## 2023-12-13 NOTE — Progress Notes (Signed)
 Subjective:  [name], female    DOB: April 05, 1940,  MRN: [id number]  Anita Walker presents to clinic today for for annual diabetic foot examination and painful elongated mycotic toenails 1-5 bilaterally which are tender when wearing enclosed shoe gear. Pain is relieved with periodic professional debridement. Her daughter is present during today's visit Chief Complaint  Patient presents with   Toe Pain    [facility]- Dr. Glendia is her PCP. Last office visit was in August 2025.    New problem(s): None.   PCP is Glendia Shad, MD.  Allergies  Allergen Reactions   Penicillins Other (See Comments)    Unknown- pt states been a long time ago  Has patient had a PCN reaction causing immediate rash, facial/tongue/throat swelling, SOB or lightheadedness with hypotension: Unknown Has patient had a PCN reaction causing severe rash involving mucus membranes or skin necrosis: Unknown Has patient had a PCN reaction that required hospitalization: Unknown Has patient had a PCN reaction occurring within the last 10 years: Unknown If all of the above answers are NO, then may proceed with Cephalosporin use.   Penicillin V Potassium Nausea And Vomiting    Review of Systems: Negative except as noted in the HPI.  Objective: No changes noted in today's physical examination. There were no vitals filed for this visit. Anita Walker is a pleasant 83 y.o. female in NAD. AAO x 3.   Diabetic foot exam was performed with the following findings:   Normal sensation of 10g monofilament Vascular Examination: CFT <3 seconds b/l. DP/PT pulses faintly palpable b/l. Pedal edema absent. Skin temperature gradient warm to warm b/l. Digital hair absent. No pain with calf compression. No ischemia or gangrene. No cyanosis or clubbing noted b/l.    Neurological Examination: Sensation grossly intact b/l with 10 gram monofilament. Vibratory sensation diminished b/l.  Dermatological Examination: Pedal skin  warm and supple b/l.   No open wounds. No interdigital macerations.  Toenails 1-5 b/l thick, discolored, elongated with subungual debris and pain on dorsal palpation.    No hyperkeratotic nor porokeratotic lesions.  Musculoskeletal Examination: Muscle strength 5/5 to all lower extremity muscle groups bilaterally. No pain, crepitus or joint limitation noted with ROM b/l LE. Hammertoes 2-5 b/l. Patient ambulates with rollator assistance.  Radiographs: None     Assessment/Plan: 1. Pain due to onychomycosis of toenails of both feet   2. Acquired hammertoes of both feet   3. Diabetic polyneuropathy associated with type 2 diabetes mellitus (HCC)   4. Encounter for diabetic foot exam (HCC)   Diabetic foot examination performed today. All patient's and/or POA's questions/concerns addressed on today's visit. Toenails 1-5 debrided in length and girth without incident. Continue foot and shoe inspections daily. Monitor blood glucose per PCP/Endocrinologist's recommendations. Continue soft, supportive shoe gear daily. Report any pedal injuries to medical professional. Call office if there are any questions/concerns. -Patient/POA to call should there be question/concern in the interim.   Return in about 3 months (around 03/14/2024).  Delon LITTIE Merlin, DPM      [place] LOCATION: [address] [place] [address]                   Office [phone])  [phone]   [facility] LOCATION: [address] [place], [place] [address] Office [phone]

## 2023-12-19 ENCOUNTER — Other Ambulatory Visit: Payer: Self-pay | Admitting: Internal Medicine

## 2023-12-19 DIAGNOSIS — Z23 Encounter for immunization: Secondary | ICD-10-CM | POA: Diagnosis not present

## 2023-12-19 DIAGNOSIS — E663 Overweight: Secondary | ICD-10-CM | POA: Diagnosis not present

## 2023-12-19 DIAGNOSIS — J4520 Mild intermittent asthma, uncomplicated: Secondary | ICD-10-CM | POA: Diagnosis not present

## 2023-12-19 DIAGNOSIS — G4733 Obstructive sleep apnea (adult) (pediatric): Secondary | ICD-10-CM | POA: Diagnosis not present

## 2023-12-20 ENCOUNTER — Encounter: Payer: Self-pay | Admitting: Podiatry

## 2023-12-21 ENCOUNTER — Other Ambulatory Visit: Payer: Self-pay | Admitting: Internal Medicine

## 2024-01-14 ENCOUNTER — Encounter: Payer: Self-pay | Admitting: Internal Medicine

## 2024-01-14 ENCOUNTER — Ambulatory Visit: Admitting: Internal Medicine

## 2024-01-14 VITALS — BP 130/72 | HR 90 | Temp 97.7°F | Ht 64.0 in | Wt 232.4 lb

## 2024-01-14 DIAGNOSIS — E1142 Type 2 diabetes mellitus with diabetic polyneuropathy: Secondary | ICD-10-CM | POA: Diagnosis not present

## 2024-01-14 DIAGNOSIS — I10 Essential (primary) hypertension: Secondary | ICD-10-CM

## 2024-01-14 DIAGNOSIS — E1149 Type 2 diabetes mellitus with other diabetic neurological complication: Secondary | ICD-10-CM

## 2024-01-14 DIAGNOSIS — E041 Nontoxic single thyroid nodule: Secondary | ICD-10-CM

## 2024-01-14 DIAGNOSIS — D649 Anemia, unspecified: Secondary | ICD-10-CM | POA: Diagnosis not present

## 2024-01-14 DIAGNOSIS — G4730 Sleep apnea, unspecified: Secondary | ICD-10-CM

## 2024-01-14 DIAGNOSIS — R9389 Abnormal findings on diagnostic imaging of other specified body structures: Secondary | ICD-10-CM

## 2024-01-14 DIAGNOSIS — J439 Emphysema, unspecified: Secondary | ICD-10-CM

## 2024-01-14 DIAGNOSIS — J4520 Mild intermittent asthma, uncomplicated: Secondary | ICD-10-CM

## 2024-01-14 DIAGNOSIS — E7800 Pure hypercholesterolemia, unspecified: Secondary | ICD-10-CM

## 2024-01-14 DIAGNOSIS — K769 Liver disease, unspecified: Secondary | ICD-10-CM | POA: Diagnosis not present

## 2024-01-14 DIAGNOSIS — F0150 Vascular dementia without behavioral disturbance: Secondary | ICD-10-CM

## 2024-01-14 DIAGNOSIS — F419 Anxiety disorder, unspecified: Secondary | ICD-10-CM

## 2024-01-14 DIAGNOSIS — Z853 Personal history of malignant neoplasm of breast: Secondary | ICD-10-CM

## 2024-01-14 DIAGNOSIS — M7989 Other specified soft tissue disorders: Secondary | ICD-10-CM

## 2024-01-14 MED ORDER — BUSPIRONE HCL 5 MG PO TABS: 5.0000 mg | ORAL_TABLET | Freq: Every day | ORAL | 1 refills | Status: DC | PRN

## 2024-01-14 MED ORDER — ESCITALOPRAM OXALATE 10 MG PO TABS
10.0000 mg | ORAL_TABLET | Freq: Every day | ORAL | 1 refills | Status: AC
Start: 2024-01-14 — End: ?

## 2024-01-14 MED ORDER — SIMVASTATIN 10 MG PO TABS
10.0000 mg | ORAL_TABLET | Freq: Every day | ORAL | 1 refills | Status: AC
Start: 2024-01-14 — End: ?

## 2024-01-14 NOTE — Progress Notes (Signed)
 Subjective:    [name], female    DOB: [birth year], 83 y.o.   MRN: [id number]  Patient here for  Chief Complaint  Patient presents with   Medical Management of Chronic Issues    HPI Here for a scheduled follow up - follow up regarding diabetes, hypertension and hypercholesterolemia.  She is accompanied by her caretaker. History obtained from both of them. Saw [facility] 09/14/23 - venous ultrasounds - negative for reflux of DVTs. Recommended compression hose. Saw neurology 09/25/23- mixed dementia. Recommended to start seroquel. Saw hematology 10/02/23 - recommended to continue eliquis 5mg  bid. Had f/u with Dr Theotis 10/16/23 - increasing subtle right basilar reticular opacity, atelectasis vs aspiration/infection. Unchanged hiatal hernia. Recommended to stay as is including oxygen  as needed. Had f/u with cardiology 12/06/23 - recommended to continue losartan  and simvastatin . Recommended sleep study. Continue torsemide. Had f/u 12/19/23 - Dr Theotis - recommended to continue cpap. Breathing stable. No increased cough or congestion. No abdominal pain. Reviewed CT chest 03/2023 - recommended thyroid  ultrasound - f/u thyroid  nodule. Discussed again today. Agreeable when due - due 08/2024.    Past Medical History:  Diagnosis Date   Anemia    Anxiety    Arthritis    Atrial fibrillation (HCC)    Breast cancer (HCC)    s/p lumpectomy 1992.  s/p chemo and xrt left breast   CHF (congestive heart failure) (HCC) 1/25   Clotting disorder    Depression 06/22   Diabetes mellitus (HCC)    Edema    feet/legs   Emphysema of lung (HCC) 2025   Gastric ulcer    GERD (gastroesophageal reflux disease)    HOH (hard of hearing)    aides   Hypercholesterolemia    Hypertension    Myocardial infarction [facility]) April 2025   Personal history of chemotherapy    Pulmonary emboli (HCC)    Sleep apnea    Thyroid  disease 2025   Past Surgical History:  Procedure Laterality Date   BREAST LUMPECTOMY   03/06/1990   left breast   CATARACT EXTRACTION W/PHACO Right 12/26/2016   Procedure: CATARACT EXTRACTION PHACO AND INTRAOCULAR LENS PLACEMENT (IOC)-RIGHT DIABETIC;  Surgeon: Jaye Fallow, MD;  Location: [facility] ORS;  Service: Ophthalmology;  Laterality: Right;  US  00:47 AP% 24.5 CDE 11.62 Fluid pack lot # [id number]   CATARACT EXTRACTION W/PHACO Left 01/23/2017   Procedure: CATARACT EXTRACTION PHACO AND INTRAOCULAR LENS PLACEMENT (IOC);  Surgeon: Jaye Fallow, MD;  Location: [facility] ORS;  Service: Ophthalmology;  Laterality: Left;  US  00:50 AP% 16.1 CDE 8.18 Fluid pack lot #[id number]   COLONOSCOPY WITH PROPOFOL  N/A 07/08/2019   Procedure: COLONOSCOPY WITH PROPOFOL ;  Surgeon: Jinny Carmine, MD;  Location: [facility] ENDOSCOPY;  Service: Endoscopy;  Laterality: N/A;   Family History  Problem Relation Age of Onset   Heart disease Father    Heart disease Brother        s/p CABG   Breast cancer Daughter    Colon cancer Neg Hx    Social History   Socioeconomic History   Marital status: Widowed    Spouse name: Not on file   Number of children: Not on file   Years of education: Not on file   Highest education level: 10th grade  Occupational History   Not on file  Tobacco Use   Smoking status: Never   Smokeless tobacco: Never  Vaping Use   Vaping status: Never Used  Substance and Sexual Activity   Alcohol use: No  Alcohol/week: 0.0 standard drinks of alcohol   Drug use: No   Sexual activity: Never  Other Topics Concern   Not on file  Social History Narrative   Not on file   Social Drivers of Health   Financial Resource Strain: Low Risk  (10/09/2023)   Overall Financial Resource Strain (CARDIA)    Difficulty of Paying Living Expenses: Not hard at all  Food Insecurity: No Food Insecurity (10/09/2023)   Hunger Vital Sign    Worried About Running Out of Food in the Last Year: Never true    Ran Out of Food in the Last Year: Never true  Transportation Needs: No Transportation Needs  (10/09/2023)   PRAPARE - Administrator, Civil Service (Medical): No    Lack of Transportation (Non-Medical): No  Physical Activity: Inactive (10/09/2023)   Exercise Vital Sign    Days of Exercise per Week: 0 days    Minutes of Exercise per Session: Not on file  Stress: Stress Concern Present (10/09/2023)   [employer] of Occupational Health - Occupational Stress Questionnaire    Feeling of Stress: To some extent  Social Connections: Socially Isolated (10/09/2023)   Social Connection and Isolation Panel    Frequency of Communication with Friends and Family: Once a week    Frequency of Social Gatherings with Friends and Family: Never    Attends Religious Services: Never    Database Administrator or Organizations: No    Attends Engineer, Structural: Not on file    Marital Status: Widowed     Review of Systems  Constitutional:  Negative for appetite change and unexpected weight change.  HENT:  Negative for congestion and sinus pressure.   Respiratory:  Negative for cough, chest tightness and shortness of breath.   Cardiovascular:  Negative for chest pain, palpitations and leg swelling.  Gastrointestinal:  Negative for abdominal pain, diarrhea, nausea and vomiting.  Genitourinary:  Negative for difficulty urinating and dysuria.  Musculoskeletal:  Negative for joint swelling and myalgias.  Skin:  Negative for color change and rash.  Neurological:  Negative for dizziness and headaches.  Psychiatric/Behavioral:  Negative for agitation and dysphoric mood.        Objective:     BP 130/72   Pulse 90   Temp 97.7 F (36.5 C) (Oral)   Ht 5' 4 (1.626 m)   Wt 232 lb 6.4 oz (105.4 kg)   SpO2 95%   BMI 39.89 kg/m  Wt Readings from Last 3 Encounters:  01/14/24 232 lb 6.4 oz (105.4 kg)  10/11/23 228 lb 8 oz (103.6 kg)  08/17/23 231 lb 3.2 oz (104.9 kg)    Physical Exam Vitals reviewed.  Constitutional:      General: She is not in acute distress.     Appearance: Normal appearance.  HENT:     Head: Normocephalic and atraumatic.     Right Ear: External ear normal.     Left Ear: External ear normal.  Eyes:     General: No scleral icterus.       Right eye: No discharge.        Left eye: No discharge.     Conjunctiva/sclera: Conjunctivae normal.  Neck:     Thyroid : No thyromegaly.  Cardiovascular:     Rate and Rhythm: Normal rate and regular rhythm.  Pulmonary:     Effort: No respiratory distress.     Breath sounds: Normal breath sounds. No wheezing.  Abdominal:     General:  Bowel sounds are normal.     Palpations: Abdomen is soft.     Tenderness: There is no abdominal tenderness.  Musculoskeletal:        General: No tenderness.     Cervical back: Neck supple. No tenderness.     Comments: Lower extremity swelling - improved.   Lymphadenopathy:     Cervical: No cervical adenopathy.  Skin:    Findings: No erythema or rash.  Neurological:     Mental Status: She is alert.  Psychiatric:        Mood and Affect: Mood normal.        Behavior: Behavior normal.         Outpatient Encounter Medications as of 01/14/2024  Medication Sig   acetaminophen  (TYLENOL ) 325 MG tablet Take by mouth.   alum & mag hydroxide-simeth (MAALOX/MYLANTA) 200-200-20 MG/5ML suspension Take 15 mLs by mouth every 6 (six) hours as needed for indigestion or heartburn.   apixaban (ELIQUIS) 5 MG TABS tablet Take 5 mg by mouth 2 (two) times daily.   diphenhydrAMINE  (BENADRYL ) 25 mg capsule Take 25 mg at bedtime as needed by mouth for sleep.    Docusate Calcium (STOOL SOFTENER PO) Take by mouth.   feeding supplement (ENSURE ENLIVE / ENSURE PLUS) LIQD Take 237 mLs by mouth 2 (two) times daily between meals.   fluticasone  (FLONASE ) 50 MCG/ACT nasal spray Place 2 sprays into both nostrils daily.   glucose blood (CONTOUR TEST) test strip USE TO TEST BLOOD SUGAR ONCE DAILY   losartan  (COZAAR ) 25 MG tablet Take 1 tablet (25 mg total) by mouth daily.   Multiple  Vitamin (MULTIVITAMIN WITH MINERALS) TABS tablet Take 1 tablet daily by mouth.   ondansetron  (ZOFRAN -ODT) 4 MG disintegrating tablet Take 1 tablet (4 mg total) by mouth every 8 (eight) hours as needed for nausea or vomiting.   pantoprazole  (PROTONIX ) 40 MG tablet TAKE 1 TABLET(40 MG) BY MOUTH DAILY   QUEtiapine (SEROQUEL) 25 MG tablet Take 25 mg by mouth at bedtime.   torsemide (DEMADEX) 20 MG tablet Take 20 mg by mouth daily.   busPIRone  (BUSPAR ) 5 MG tablet Take 1 tablet (5 mg total) by mouth daily as needed.   escitalopram (LEXAPRO) 10 MG tablet Take 1 tablet (10 mg total) by mouth daily.   simvastatin  (ZOCOR ) 10 MG tablet Take 1 tablet (10 mg total) by mouth daily.   [DISCONTINUED] busPIRone  (BUSPAR ) 5 MG tablet TAKE 1 TABLET(5 MG) BY MOUTH DAILY AS NEEDED   [DISCONTINUED] escitalopram (LEXAPRO) 10 MG tablet TAKE 1 TABLET BY MOUTH DAILY   [DISCONTINUED] simvastatin  (ZOCOR ) 10 MG tablet Take 1 tablet (10 mg total) by mouth daily.   No facility-administered encounter medications on file as of 01/14/2024.     Lab Results  Component Value Date   WBC 6.5 01/14/2024   HGB 12.4 01/14/2024   HCT 37.1 01/14/2024   PLT 238.0 01/14/2024   GLUCOSE 126 (H) 01/14/2024   CHOL 137 01/14/2024   TRIG 62.0 01/14/2024   HDL 71.10 01/14/2024   LDLCALC 54 01/14/2024   ALT 10 01/14/2024   AST 15 01/14/2024   NA 134 (L) 01/14/2024   K 4.3 01/14/2024   CL 94 (L) 01/14/2024   CREATININE 0.98 01/14/2024   BUN 23 01/14/2024   CO2 31 01/14/2024   TSH 4.06 10/11/2023   INR 1.2 (H) 07/22/2020   HGBA1C 6.0 01/14/2024   MICROALBUR 0.8 10/11/2023    CT Head Wo Contrast Result Date: 08/02/2023 CLINICAL DATA:  Head  trauma, minor (Age >= 65y); Neck trauma (Age >= 65y). Fall. Head and neck pain. On blood thinners. EXAM: CT HEAD WITHOUT CONTRAST CT CERVICAL SPINE WITHOUT CONTRAST TECHNIQUE: Multidetector CT imaging of the head and cervical spine was performed following the standard protocol without intravenous  contrast. Multiplanar CT image reconstructions of the cervical spine were also generated. RADIATION DOSE REDUCTION: This exam was performed according to the departmental dose-optimization program which includes automated exposure control, adjustment of the mA and/or kV according to patient size and/or use of iterative reconstruction technique. COMPARISON:  Cervical spine CT 04/15/2020. Head CT and MRI 03/23/2023. FINDINGS: CT HEAD FINDINGS Brain: There is no evidence of an acute infarct, intracranial hemorrhage, mass, midline shift, or extra-axial fluid collection. Patchy and confluent hypodensities in the cerebral white matter are unchanged and nonspecific but compatible with moderate to severe chronic small vessel ischemic disease. There is mild cerebral atrophy. Vascular: Calcified atherosclerosis at the skull base. No hyperdense vessel. Skull: No fracture or suspicious lesion. Sinuses/Orbits: Minimal mucosal thickening or fluid in the right sphenoid sinus. Clear mastoid air cells. Bilateral cataract extraction. Other: None. CT CERVICAL SPINE FINDINGS Alignment: Chronic straightening of the normal cervical lordosis with trace anterolisthesis of C3 on C4 and C4 on C5. Skull base and vertebrae: No acute fracture or suspicious lesion. Chronic compression deformities of the C7, T1, and T2 vertebral bodies. Soft tissues and spinal canal: No prevertebral fluid or swelling. No visible canal hematoma. Disc levels: Multilevel disc degeneration with disc space height loss being most severe at C5-6. Widespread facet arthrosis, asymmetrically advanced on the left at C3-4 and C4-5. Facet ankylosis at C2-3. Moderate right neural foraminal stenosis at C5-6 due to uncovertebral spurring. Upper chest: Focal band-like opacity in the posterior left upper lobe suggestive of scarring or atelectasis. Other: None. IMPRESSION: 1. No evidence of acute intracranial abnormality or cervical spine fracture. 2. Moderate to severe chronic  small vessel ischemic disease. Electronically Signed   By: Dasie Hamburg M.D.   On: 08/02/2023 12:05   CT Cervical Spine Wo Contrast Result Date: 08/02/2023 CLINICAL DATA:  Head trauma, minor (Age >= 65y); Neck trauma (Age >= 65y). Fall. Head and neck pain. On blood thinners. EXAM: CT HEAD WITHOUT CONTRAST CT CERVICAL SPINE WITHOUT CONTRAST TECHNIQUE: Multidetector CT imaging of the head and cervical spine was performed following the standard protocol without intravenous contrast. Multiplanar CT image reconstructions of the cervical spine were also generated. RADIATION DOSE REDUCTION: This exam was performed according to the departmental dose-optimization program which includes automated exposure control, adjustment of the mA and/or kV according to patient size and/or use of iterative reconstruction technique. COMPARISON:  Cervical spine CT 04/15/2020. Head CT and MRI 03/23/2023. FINDINGS: CT HEAD FINDINGS Brain: There is no evidence of an acute infarct, intracranial hemorrhage, mass, midline shift, or extra-axial fluid collection. Patchy and confluent hypodensities in the cerebral white matter are unchanged and nonspecific but compatible with moderate to severe chronic small vessel ischemic disease. There is mild cerebral atrophy. Vascular: Calcified atherosclerosis at the skull base. No hyperdense vessel. Skull: No fracture or suspicious lesion. Sinuses/Orbits: Minimal mucosal thickening or fluid in the right sphenoid sinus. Clear mastoid air cells. Bilateral cataract extraction. Other: None. CT CERVICAL SPINE FINDINGS Alignment: Chronic straightening of the normal cervical lordosis with trace anterolisthesis of C3 on C4 and C4 on C5. Skull base and vertebrae: No acute fracture or suspicious lesion. Chronic compression deformities of the C7, T1, and T2 vertebral bodies. Soft tissues and spinal canal: No prevertebral  fluid or swelling. No visible canal hematoma. Disc levels: Multilevel disc degeneration with disc  space height loss being most severe at C5-6. Widespread facet arthrosis, asymmetrically advanced on the left at C3-4 and C4-5. Facet ankylosis at C2-3. Moderate right neural foraminal stenosis at C5-6 due to uncovertebral spurring. Upper chest: Focal band-like opacity in the posterior left upper lobe suggestive of scarring or atelectasis. Other: None. IMPRESSION: 1. No evidence of acute intracranial abnormality or cervical spine fracture. 2. Moderate to severe chronic small vessel ischemic disease. Electronically Signed   By: Dasie Hamburg M.D.   On: 08/02/2023 12:05   DG Chest 2 View Result Date: 08/02/2023 CLINICAL DATA:  Fall EXAM: CHEST - 2 VIEW COMPARISON:  Chest x-ray performed March 23, 2023 FINDINGS: No pneumothorax or pleural effusion. Mild interstitial prominence. Heart mediastinum are not significantly changed. Hiatal hernia. Degenerative changes in the imaged osseous structures with underlying changes of osteopenia. IMPRESSION: 1. No pneumothorax. 2. Hiatal hernia. Electronically Signed   By: Maude Naegeli M.D.   On: 08/02/2023 11:40       Assessment & Plan:  Liver lesion Assessment & Plan: Indeterminate 2.4 cm right hepatic love lesion found on CTA chest w (4/22). AST, ALT, INR and platelets are wnl. Further imaging with MRI to better characterize the lesion outpatient Found during recent CT - during hospitalization. Plan for f/u MRI abdomen. Will see if we can get her stronger and plan f/u with [facility] and neurology as outlined.    Hypercholesteremia Assessment & Plan: On simvastatin .  Low cholesterol diet and exercise.  Follow lipid panel and liver function tests. No change in medication today.  Lab Results  Component Value Date   CHOL 137 01/14/2024   HDL 71.10 01/14/2024   LDLCALC 54 01/14/2024   TRIG 62.0 01/14/2024   CHOLHDL 2 01/14/2024     Orders: -     Lipid panel -     Hepatic function panel -     CBC with Differential/Platelet  Type 2 diabetes mellitus with  neurological complications (HCC) Assessment & Plan: Low carb diet and exercise as tolerated.  Follow met b and a1c.  No change today.  Lab Results  Component Value Date   HGBA1C 6.0 01/14/2024     Orders: -     Hemoglobin A1c -     Basic metabolic panel with GFR  Thyroid  nodule Assessment & Plan: Noticed on scan 03/2023. Thyroid  ultrasound 08/2023 - Bilateral thyroid  nodules as described above. Recommend repeat ultrasound in 1 year. Discussed. She is agreeable. Will plan for repeat 08/2024.    Swelling of lower extremity Assessment & Plan: Saw [facility] 09/14/23 - venous ultrasounds - negative for reflux of DVTs. Recommended compression hose.    Sleep apnea, unspecified type Assessment & Plan: Seeing pulmonary. Continue cpap.    Mild intermittent reactive airway disease without complication Assessment & Plan: Had f/u with Dr Theotis 10/16/23 - increasing subtle right basilar reticular opacity, atelectasis vs aspiration/infection. Unchanged hiatal hernia. Recommended to stay as is including oxygen  as needed.    Pure hypercholesterolemia Assessment & Plan: Low cholesterol diet. Exercise as tolerated. Follow lipid panel. Lab Results  Component Value Date   CHOL 137 01/14/2024   HDL 71.10 01/14/2024   LDLCALC 54 01/14/2024   TRIG 62.0 01/14/2024   CHOLHDL 2 01/14/2024      Mixed dementia [facility]) Assessment & Plan: Evaluated by Dr Maree - 09/2023. - recommended to start seroquel. Continues on seroquel. Stable.    Primary hypertension Assessment &  Plan: Continue losartan  and amlodipine .  Follow pressures.  Follow metabolic panel.  Lab Results  Component Value Date   CREATININE 0.98 01/14/2024      History of breast cancer Assessment & Plan: Overdue mammogram. Ordered.    Emphysema lung [facility]) Assessment & Plan: Had f/u with Dr Theotis 10/16/23 - increasing subtle right basilar reticular opacity, atelectasis vs aspiration/infection. Unchanged hiatal hernia. Recommended to  stay as is including oxygen  as needed.    Diabetic polyneuropathy associated with type 2 diabetes mellitus (HCC) Assessment & Plan: Sugars have been controlled.  Continue low carb diet and exercise.  Follow met b and A1c. No change in treatment today.  Lab Results  Component Value Date   HGBA1C 6.0 01/14/2024      Anxiety Assessment & Plan: Continue lexapro. Also taking seroquel at night. Stable. Follow.    Anemia, unspecified type Assessment & Plan: Check cbc today.    Abnormal chest CT Assessment & Plan: CT chest 03/2023 - Mild bilateral ground-glass opacities and areas of septal line thickening may reflect pulmonary edema. Enlarged main pulmonary artery likely reflecting pulmonary hypertension.CT 06/2023 - Examination positive for extensive bilateral pulmonary emboli with enlarged pulmonary artery= suggesting pulmonary hypertension but no CT signs of right heart strain.  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  Large hiatal hernia. Continues on eliquis. Breathing stable.  Seeing pulmonary. Had discussed f/u CT. Had f/u with Dr Theotis 10/16/23 - increasing subtle right basilar reticular opacity, atelectasis vs aspiration/infection. Unchanged hiatal hernia. Recommended to stay as is including oxygen  as needed.    Other orders -     busPIRone  HCl; Take 1 tablet (5 mg total) by mouth daily as needed.  Dispense: 30 tablet; Refill: 1 -     Escitalopram Oxalate; Take 1 tablet (10 mg total) by mouth daily.  Dispense: 90 tablet; Refill: 1 -     Simvastatin ; Take 1 tablet (10 mg total) by mouth daily.  Dispense: 90 tablet; Refill: 1     Allena Hamilton, MD

## 2024-01-14 NOTE — Assessment & Plan Note (Signed)
 Indeterminate 2.4 cm right hepatic love lesion found on CTA chest w (4/22). AST, ALT, INR and platelets are wnl. Further imaging with MRI to better characterize the lesion outpatient Found during recent CT - during hospitalization. Plan for f/u MRI abdomen. Will see if we can get her stronger and plan f/u with [facility] and neurology as outlined.

## 2024-01-15 ENCOUNTER — Ambulatory Visit
Admission: RE | Admit: 2024-01-15 | Discharge: 2024-01-15 | Disposition: A | Discharge status: Home / Self Care | Source: Ambulatory Visit | Attending: Internal Medicine | Admitting: Internal Medicine

## 2024-01-15 DIAGNOSIS — Z1231 Encounter for screening mammogram for malignant neoplasm of breast: Secondary | ICD-10-CM | POA: Insufficient documentation

## 2024-01-15 LAB — BASIC METABOLIC PANEL WITH GFR
BUN: 23 mg/dL (ref 6–23)
CO2: 31 meq/L (ref 19–32)
Calcium: 9.2 mg/dL (ref 8.4–10.5)
Chloride: 94 meq/L — ABNORMAL LOW (ref 96–112)
Creatinine, Ser: 0.98 mg/dL (ref 0.40–1.20)
GFR: 52.52 mL/min — ABNORMAL LOW (ref >60.00)
Glucose, Bld: 126 mg/dL — ABNORMAL HIGH (ref 70–99)
Potassium: 4.3 meq/L (ref 3.5–5.1)
Sodium: 134 meq/L — ABNORMAL LOW (ref 135–145)

## 2024-01-15 LAB — LIPID PANEL
Cholesterol: 137 mg/dL (ref 0–200)
HDL: 71.1 mg/dL (ref >39.00)
LDL Cholesterol: 54 mg/dL (ref 0–99)
NonHDL: 66.37
Total CHOL/HDL Ratio: 2
Triglycerides: 62 mg/dL (ref 0.0–149.0)
VLDL: 12.4 mg/dL (ref 0.0–40.0)

## 2024-01-15 LAB — CBC WITH DIFFERENTIAL/PLATELET
Basophils Absolute: 0.1 K/uL (ref 0.0–0.1)
Basophils Relative: 1 % (ref 0.0–3.0)
Eosinophils Absolute: 0.1 K/uL (ref 0.0–0.7)
Eosinophils Relative: 1.5 % (ref 0.0–5.0)
HCT: 37.1 % (ref 36.0–46.0)
Hemoglobin: 12.4 g/dL (ref 12.0–15.0)
Lymphocytes Relative: 25.3 % (ref 12.0–46.0)
Lymphs Abs: 1.6 K/uL (ref 0.7–4.0)
MCHC: 33.4 g/dL (ref 30.0–36.0)
MCV: 77.4 fl — ABNORMAL LOW (ref 78.0–100.0)
Monocytes Absolute: 1.3 K/uL — ABNORMAL HIGH (ref 0.1–1.0)
Monocytes Relative: 19.8 % — ABNORMAL HIGH (ref 3.0–12.0)
Neutro Abs: 3.4 K/uL (ref 1.4–7.7)
Neutrophils Relative %: 52.4 % (ref 43.0–77.0)
Platelets: 238 K/uL (ref 150.0–400.0)
RBC: 4.8 Mil/uL (ref 3.87–5.11)
RDW: 20.1 % — ABNORMAL HIGH (ref 11.5–15.5)
WBC: 6.5 K/uL (ref 4.0–10.5)

## 2024-01-15 LAB — HEPATIC FUNCTION PANEL
ALT: 10 U/L (ref 0–35)
AST: 15 U/L (ref 0–37)
Albumin: 3.9 g/dL (ref 3.5–5.2)
Alkaline Phosphatase: 59 U/L (ref 39–117)
Bilirubin, Direct: 0.1 mg/dL (ref 0.0–0.3)
Total Bilirubin: 0.3 mg/dL (ref 0.2–1.2)
Total Protein: 6.9 g/dL (ref 6.0–8.3)

## 2024-01-15 LAB — HEMOGLOBIN A1C: Hgb A1c MFr Bld: 6 % (ref 4.6–6.5)

## 2024-01-16 ENCOUNTER — Ambulatory Visit: Payer: Self-pay | Admitting: Internal Medicine

## 2024-01-17 ENCOUNTER — Other Ambulatory Visit: Payer: Self-pay | Admitting: Internal Medicine

## 2024-01-18 ENCOUNTER — Other Ambulatory Visit: Payer: Self-pay | Admitting: Internal Medicine

## 2024-01-20 ENCOUNTER — Encounter: Payer: Self-pay | Admitting: Internal Medicine

## 2024-01-20 ENCOUNTER — Telehealth: Payer: Self-pay | Admitting: Internal Medicine

## 2024-01-20 NOTE — Assessment & Plan Note (Signed)
 Seeing pulmonary. Continue cpap.

## 2024-01-20 NOTE — Assessment & Plan Note (Signed)
 Continue lexapro. Also taking seroquel at night. Stable. Follow.

## 2024-01-20 NOTE — Assessment & Plan Note (Signed)
 Continue losartan  and amlodipine .  Follow pressures.  Follow metabolic panel.  Lab Results  Component Value Date   CREATININE 0.98 01/14/2024

## 2024-01-20 NOTE — Assessment & Plan Note (Signed)
 Had f/u with Dr Theotis 10/16/23 - increasing subtle right basilar reticular opacity, atelectasis vs aspiration/infection. Unchanged hiatal hernia. Recommended to stay as is including oxygen  as needed.

## 2024-01-20 NOTE — Assessment & Plan Note (Signed)
 Noticed on scan 03/2023. Thyroid  ultrasound 08/2023 - Bilateral thyroid  nodules as described above. Recommend repeat ultrasound in 1 year. Discussed. She is agreeable. Will plan for repeat 08/2024.

## 2024-01-20 NOTE — Assessment & Plan Note (Signed)
 Low cholesterol diet. Exercise as tolerated. Follow lipid panel. Lab Results  Component Value Date   CHOL 137 01/14/2024   HDL 71.10 01/14/2024   LDLCALC 54 01/14/2024   TRIG 62.0 01/14/2024   CHOLHDL 2 01/14/2024

## 2024-01-20 NOTE — Assessment & Plan Note (Signed)
 Sugars have been controlled.  Continue low carb diet and exercise.  Follow met b and A1c. No change in treatment today.  Lab Results  Component Value Date   HGBA1C 6.0 01/14/2024

## 2024-01-20 NOTE — Telephone Encounter (Signed)
 Please call and speak to pts daughter Reimann) or caretaker and notify thyroid  ultrasound not due until 08/2024. Will hold on scheduling right now. Also, is overdue mammogram. See if agreeable to schedule.

## 2024-01-20 NOTE — Assessment & Plan Note (Signed)
 Evaluated by Dr Maree - 09/2023. - recommended to start seroquel. Continues on seroquel. Stable.

## 2024-01-20 NOTE — Assessment & Plan Note (Signed)
 CT chest 03/2023 - Mild bilateral ground-glass opacities and areas of septal line thickening may reflect pulmonary edema. Enlarged main pulmonary artery likely reflecting pulmonary hypertension.CT 06/2023 - Examination positive for extensive bilateral pulmonary emboli with enlarged pulmonary artery= suggesting pulmonary hypertension but no CT signs of right heart strain.  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  Large hiatal hernia. Continues on eliquis. Breathing stable.  Seeing pulmonary. Had discussed f/u CT. Had f/u with Dr Theotis 10/16/23 - increasing subtle right basilar reticular opacity, atelectasis vs aspiration/infection. Unchanged hiatal hernia. Recommended to stay as is including oxygen  as needed.

## 2024-01-20 NOTE — Assessment & Plan Note (Signed)
Overdue mammogram.  Ordered.

## 2024-01-20 NOTE — Assessment & Plan Note (Signed)
Check cbc today 

## 2024-01-20 NOTE — Assessment & Plan Note (Signed)
 Low carb diet and exercise as tolerated.  Follow met b and a1c.  No change today.  Lab Results  Component Value Date   HGBA1C 6.0 01/14/2024

## 2024-01-20 NOTE — Assessment & Plan Note (Signed)
 On simvastatin .  Low cholesterol diet and exercise.  Follow lipid panel and liver function tests. No change in medication today.  Lab Results  Component Value Date   CHOL 137 01/14/2024   HDL 71.10 01/14/2024   LDLCALC 54 01/14/2024   TRIG 62.0 01/14/2024   CHOLHDL 2 01/14/2024

## 2024-01-20 NOTE — Assessment & Plan Note (Signed)
 Saw [facility] 09/14/23 - venous ultrasounds - negative for reflux of DVTs. Recommended compression hose.

## 2024-02-09 DIAGNOSIS — I2510 Atherosclerotic heart disease of native coronary artery without angina pectoris: Secondary | ICD-10-CM | POA: Diagnosis not present

## 2024-02-09 DIAGNOSIS — R059 Cough, unspecified: Secondary | ICD-10-CM | POA: Diagnosis not present

## 2024-02-09 DIAGNOSIS — K219 Gastro-esophageal reflux disease without esophagitis: Secondary | ICD-10-CM | POA: Diagnosis not present

## 2024-02-09 DIAGNOSIS — I509 Heart failure, unspecified: Secondary | ICD-10-CM | POA: Diagnosis not present

## 2024-02-09 DIAGNOSIS — F419 Anxiety disorder, unspecified: Secondary | ICD-10-CM | POA: Diagnosis not present

## 2024-02-09 DIAGNOSIS — Z7901 Long term (current) use of anticoagulants: Secondary | ICD-10-CM | POA: Diagnosis not present

## 2024-02-09 DIAGNOSIS — R062 Wheezing: Secondary | ICD-10-CM | POA: Diagnosis not present

## 2024-02-09 DIAGNOSIS — I10 Essential (primary) hypertension: Secondary | ICD-10-CM | POA: Diagnosis not present

## 2024-02-09 DIAGNOSIS — B348 Other viral infections of unspecified site: Secondary | ICD-10-CM | POA: Diagnosis not present

## 2024-02-09 DIAGNOSIS — R5381 Other malaise: Secondary | ICD-10-CM | POA: Diagnosis not present

## 2024-02-09 DIAGNOSIS — G309 Alzheimer's disease, unspecified: Secondary | ICD-10-CM | POA: Diagnosis not present

## 2024-02-09 DIAGNOSIS — K7689 Other specified diseases of liver: Secondary | ICD-10-CM | POA: Diagnosis not present

## 2024-02-09 DIAGNOSIS — J439 Emphysema, unspecified: Secondary | ICD-10-CM | POA: Diagnosis not present

## 2024-02-09 DIAGNOSIS — J441 Chronic obstructive pulmonary disease with (acute) exacerbation: Secondary | ICD-10-CM | POA: Diagnosis not present

## 2024-02-09 DIAGNOSIS — I480 Paroxysmal atrial fibrillation: Secondary | ICD-10-CM | POA: Diagnosis not present

## 2024-02-09 DIAGNOSIS — I110 Hypertensive heart disease with heart failure: Secondary | ICD-10-CM | POA: Diagnosis not present

## 2024-02-09 DIAGNOSIS — F32A Depression, unspecified: Secondary | ICD-10-CM | POA: Diagnosis not present

## 2024-02-09 DIAGNOSIS — E119 Type 2 diabetes mellitus without complications: Secondary | ICD-10-CM | POA: Diagnosis not present

## 2024-02-10 DIAGNOSIS — F419 Anxiety disorder, unspecified: Secondary | ICD-10-CM | POA: Diagnosis not present

## 2024-02-10 DIAGNOSIS — J441 Chronic obstructive pulmonary disease with (acute) exacerbation: Secondary | ICD-10-CM | POA: Diagnosis not present

## 2024-02-10 DIAGNOSIS — J984 Other disorders of lung: Secondary | ICD-10-CM | POA: Diagnosis not present

## 2024-02-10 DIAGNOSIS — F02A3 Dementia in other diseases classified elsewhere, mild, with mood disturbance: Secondary | ICD-10-CM | POA: Diagnosis not present

## 2024-02-10 DIAGNOSIS — G301 Alzheimer's disease with late onset: Secondary | ICD-10-CM | POA: Diagnosis not present

## 2024-02-10 DIAGNOSIS — R0989 Other specified symptoms and signs involving the circulatory and respiratory systems: Secondary | ICD-10-CM | POA: Diagnosis not present

## 2024-02-10 DIAGNOSIS — K219 Gastro-esophageal reflux disease without esophagitis: Secondary | ICD-10-CM | POA: Diagnosis not present

## 2024-02-10 DIAGNOSIS — I10 Essential (primary) hypertension: Secondary | ICD-10-CM | POA: Diagnosis not present

## 2024-02-10 DIAGNOSIS — J189 Pneumonia, unspecified organism: Secondary | ICD-10-CM | POA: Diagnosis not present

## 2024-02-10 DIAGNOSIS — R0609 Other forms of dyspnea: Secondary | ICD-10-CM | POA: Diagnosis not present

## 2024-02-10 DIAGNOSIS — J9811 Atelectasis: Secondary | ICD-10-CM | POA: Diagnosis not present

## 2024-02-10 DIAGNOSIS — I2510 Atherosclerotic heart disease of native coronary artery without angina pectoris: Secondary | ICD-10-CM | POA: Diagnosis not present

## 2024-02-10 DIAGNOSIS — R600 Localized edema: Secondary | ICD-10-CM | POA: Diagnosis not present

## 2024-02-10 DIAGNOSIS — B348 Other viral infections of unspecified site: Secondary | ICD-10-CM | POA: Diagnosis not present

## 2024-02-10 DIAGNOSIS — F32A Depression, unspecified: Secondary | ICD-10-CM | POA: Diagnosis not present

## 2024-02-10 DIAGNOSIS — E8770 Fluid overload, unspecified: Secondary | ICD-10-CM | POA: Diagnosis not present

## 2024-02-11 DIAGNOSIS — J189 Pneumonia, unspecified organism: Secondary | ICD-10-CM | POA: Diagnosis not present

## 2024-02-11 DIAGNOSIS — F0150 Vascular dementia without behavioral disturbance: Secondary | ICD-10-CM | POA: Diagnosis not present

## 2024-02-11 DIAGNOSIS — J441 Chronic obstructive pulmonary disease with (acute) exacerbation: Secondary | ICD-10-CM | POA: Diagnosis not present

## 2024-02-11 DIAGNOSIS — I2510 Atherosclerotic heart disease of native coronary artery without angina pectoris: Secondary | ICD-10-CM | POA: Diagnosis not present

## 2024-02-11 DIAGNOSIS — Z9989 Dependence on other enabling machines and devices: Secondary | ICD-10-CM | POA: Diagnosis not present

## 2024-02-11 DIAGNOSIS — E119 Type 2 diabetes mellitus without complications: Secondary | ICD-10-CM | POA: Diagnosis not present

## 2024-02-11 DIAGNOSIS — G4733 Obstructive sleep apnea (adult) (pediatric): Secondary | ICD-10-CM | POA: Diagnosis not present

## 2024-02-11 DIAGNOSIS — Z79899 Other long term (current) drug therapy: Secondary | ICD-10-CM | POA: Diagnosis not present

## 2024-02-11 DIAGNOSIS — R600 Localized edema: Secondary | ICD-10-CM | POA: Diagnosis not present

## 2024-02-11 DIAGNOSIS — I480 Paroxysmal atrial fibrillation: Secondary | ICD-10-CM | POA: Diagnosis not present

## 2024-02-11 DIAGNOSIS — I10 Essential (primary) hypertension: Secondary | ICD-10-CM | POA: Diagnosis not present

## 2024-02-11 DIAGNOSIS — K219 Gastro-esophageal reflux disease without esophagitis: Secondary | ICD-10-CM | POA: Diagnosis not present

## 2024-02-12 DIAGNOSIS — G4733 Obstructive sleep apnea (adult) (pediatric): Secondary | ICD-10-CM | POA: Diagnosis not present

## 2024-02-12 DIAGNOSIS — J441 Chronic obstructive pulmonary disease with (acute) exacerbation: Secondary | ICD-10-CM | POA: Diagnosis not present

## 2024-02-12 DIAGNOSIS — F0150 Vascular dementia without behavioral disturbance: Secondary | ICD-10-CM | POA: Diagnosis not present

## 2024-02-12 DIAGNOSIS — R600 Localized edema: Secondary | ICD-10-CM | POA: Diagnosis not present

## 2024-02-12 DIAGNOSIS — J189 Pneumonia, unspecified organism: Secondary | ICD-10-CM | POA: Diagnosis not present

## 2024-02-12 DIAGNOSIS — Z79899 Other long term (current) drug therapy: Secondary | ICD-10-CM | POA: Diagnosis not present

## 2024-02-12 DIAGNOSIS — E119 Type 2 diabetes mellitus without complications: Secondary | ICD-10-CM | POA: Diagnosis not present

## 2024-02-12 DIAGNOSIS — Z9989 Dependence on other enabling machines and devices: Secondary | ICD-10-CM | POA: Diagnosis not present

## 2024-02-12 DIAGNOSIS — I2510 Atherosclerotic heart disease of native coronary artery without angina pectoris: Secondary | ICD-10-CM | POA: Diagnosis not present

## 2024-02-12 DIAGNOSIS — I480 Paroxysmal atrial fibrillation: Secondary | ICD-10-CM | POA: Diagnosis not present

## 2024-02-12 DIAGNOSIS — I10 Essential (primary) hypertension: Secondary | ICD-10-CM | POA: Diagnosis not present

## 2024-02-12 DIAGNOSIS — K219 Gastro-esophageal reflux disease without esophagitis: Secondary | ICD-10-CM | POA: Diagnosis not present

## 2024-02-13 DIAGNOSIS — E1169 Type 2 diabetes mellitus with other specified complication: Secondary | ICD-10-CM | POA: Diagnosis not present

## 2024-02-13 DIAGNOSIS — E785 Hyperlipidemia, unspecified: Secondary | ICD-10-CM | POA: Diagnosis not present

## 2024-02-13 DIAGNOSIS — K219 Gastro-esophageal reflux disease without esophagitis: Secondary | ICD-10-CM | POA: Diagnosis not present

## 2024-02-13 DIAGNOSIS — Z86718 Personal history of other venous thrombosis and embolism: Secondary | ICD-10-CM | POA: Diagnosis not present

## 2024-02-13 DIAGNOSIS — I5030 Unspecified diastolic (congestive) heart failure: Secondary | ICD-10-CM | POA: Diagnosis not present

## 2024-02-13 DIAGNOSIS — I10 Essential (primary) hypertension: Secondary | ICD-10-CM | POA: Diagnosis not present

## 2024-02-13 DIAGNOSIS — B348 Other viral infections of unspecified site: Secondary | ICD-10-CM | POA: Diagnosis not present

## 2024-02-13 DIAGNOSIS — N179 Acute kidney failure, unspecified: Secondary | ICD-10-CM | POA: Diagnosis not present

## 2024-02-13 DIAGNOSIS — J449 Chronic obstructive pulmonary disease, unspecified: Secondary | ICD-10-CM | POA: Diagnosis not present

## 2024-02-13 DIAGNOSIS — I2510 Atherosclerotic heart disease of native coronary artery without angina pectoris: Secondary | ICD-10-CM | POA: Diagnosis not present

## 2024-02-13 DIAGNOSIS — I480 Paroxysmal atrial fibrillation: Secondary | ICD-10-CM | POA: Diagnosis not present

## 2024-02-13 DIAGNOSIS — J189 Pneumonia, unspecified organism: Secondary | ICD-10-CM | POA: Diagnosis not present

## 2024-03-14 ENCOUNTER — Telehealth: Payer: Self-pay

## 2024-03-14 ENCOUNTER — Ambulatory Visit: Admitting: Podiatry

## 2024-03-14 NOTE — Telephone Encounter (Signed)
 Copied from CRM [id number]. Topic: Appointments - Appointment Scheduling >> Mar 14, 2024  1:34 PM Anita Walker wrote: Patient/patient representative is calling to schedule an appointment. Refer to attachments for appointment information. Patient needs hospital follow up discharging on 1/10. Please contact [phone] pt daughter  I spoke with patient's daughter, Anita Walker, to let her know that we did receive her message.  I let her know that we don't usually schedule hospital follow-up visits before patients are discharged.  I let her know that we do have an appointment available with Dr. Allena Hamilton on 03/18/2024 at 8am.  [name] states her mom's sitter won't get there until 9am, so they will need a later appointment.  I let her know that I will send a message to Dr. Hamilton so she will be aware.

## 2024-03-17 ENCOUNTER — Telehealth: Payer: Self-pay

## 2024-03-17 ENCOUNTER — Ambulatory Visit (INDEPENDENT_AMBULATORY_CARE_PROVIDER_SITE_OTHER): Admitting: Vascular Surgery

## 2024-03-17 NOTE — Transitions of Care (Post Inpatient/ED Visit) (Signed)
 "  03/17/2024  Name: Anita Walker MRN: [id number] DOB: 08/08/1940  Today's TOC FU Call Status: Today's TOC FU Call Status:: Successful TOC FU Call Completed TOC FU Call Complete Date: 03/17/24  Patient's Name and Date of Birth confirmed. Name, DOB  Transition Care Management Follow-up Telephone Call Date of Discharge: 03/15/24 Discharge Facility: Other ([facility]) Name of Other ([facility]) Discharge Facility: [facility] Type of Discharge: Inpatient Admission Primary Inpatient Discharge Diagnosis:: COPD How have you been since you were released from the hospital?: Same Any questions or concerns?: No  Items Reviewed: Did you receive and understand the discharge instructions provided?: Yes Medications obtained,verified, and reconciled?: Yes (Medications Reviewed) Any new allergies since your discharge?: No Dietary orders reviewed?: Yes Do you have support at home?: Yes People in Home [RPT]: child(ren), adult  Medications Reviewed Today: Medications Reviewed Today     Reviewed by Emmitt Pan, LPN (Licensed Practical Nurse) on 03/17/24 at 1133  Med List Status: <None>   Medication Order Taking? Sig Documenting Provider Last Dose Status Informant  acetaminophen  (TYLENOL ) 325 MG tablet [id number] Yes Take by mouth. [provider]  Active   alum & mag hydroxide-simeth (MAALOX/MYLANTA) 200-200-20 MG/5ML suspension [id number] Yes Take 15 mLs by mouth every 6 (six) hours as needed for indigestion or heartburn. Darci Pore, MD  Active   apixaban (ELIQUIS) 5 MG TABS tablet [id number] Yes Take 5 mg by mouth 2 (two) times daily. [provider]  Active   busPIRone  (BUSPAR ) 5 MG tablet [id number] Yes Take 1 tablet (5 mg total) by mouth daily as needed. Glendia Shad, MD  Active   diphenhydrAMINE  (BENADRYL ) 25 mg capsule [id number] Yes Take 25 mg at bedtime as needed by mouth for sleep.  [provider]  Active Child  Docusate Calcium (STOOL  SOFTENER PO) [id number] Yes Take by mouth. [provider]  Active   escitalopram  (LEXAPRO ) 10 MG tablet [id number] Yes Take 1 tablet (10 mg total) by mouth daily. Glendia Shad, MD  Active   feeding supplement (ENSURE ENLIVE / ENSURE PLUS) LIQD [id number] Yes Take 237 mLs by mouth 2 (two) times daily between meals. Tobie Calix, MD  Active Child  fluticasone  (FLONASE ) 50 MCG/ACT nasal spray [id number] Yes Place 2 sprays into both nostrils daily. [provider]  Active   glucose blood (CONTOUR TEST) test strip [id number] Yes USE TO TEST BLOOD SUGAR ONCE DAILY Glendia Shad, MD  Active Child  losartan  (COZAAR ) 25 MG tablet [id number] Yes Take 1 tablet (25 mg total) by mouth daily. Glendia Shad, MD  Active   Multiple Vitamin (MULTIVITAMIN WITH MINERALS) TABS tablet [id number] Yes Take 1 tablet daily by mouth. [provider]  Active Child  ondansetron  (ZOFRAN -ODT) 4 MG disintegrating tablet [id number] Yes Take 1 tablet (4 mg total) by mouth every 8 (eight) hours as needed for nausea or vomiting. Narendra, Nischal, MD  Active   pantoprazole  (PROTONIX ) 40 MG tablet [id number] Yes TAKE 1 TABLET(40 MG) BY MOUTH DAILY Glendia Shad, MD  Active   QUEtiapine (SEROQUEL) 25 MG tablet [id number] Yes Take 25 mg by mouth at bedtime. [provider]  Active   simvastatin  (ZOCOR ) 10 MG tablet [id number] Yes Take 1 tablet (10 mg total) by mouth daily. Glendia Shad, MD  Active   torsemide (DEMADEX) 20 MG tablet [id number]  Take 20 mg by mouth daily.  Patient not taking: Reported on 03/17/2024   [provider]  Active            Med Note (GREEN,  DAVINA E   Fri Jun 22, 2023  9:30 AM) Daughter states patient takes 1 tablet 2 x per day.             Home Care and Equipment/Supplies: Were Home Health Services Ordered?: Yes Name of Home Health Agency:: [facility] Has Agency set up a time to come to your home?: No Any new equipment or medical supplies ordered?: Yes Name  of Medical supply agency?: [facility] Were you able to get the equipment/medical supplies?: Yes Do you have any questions related to the use of the equipment/supplies?: No  Functional Questionnaire: Do you need assistance with bathing/showering or dressing?: Yes Do you need assistance with meal preparation?: Yes Do you need assistance with eating?: No Do you have difficulty maintaining continence: No Do you need assistance with getting out of bed/getting out of a chair/moving?: No Do you have difficulty managing or taking your medications?: Yes  Follow up appointments reviewed: PCP Follow-up appointment confirmed?: No (declined, can't get her out of the house until wheelchair ramp build) MD Provider Line Number:[phone] Given: No Specialist Hospital Follow-up appointment confirmed?: NA Do you need transportation to your follow-up appointment?: No Do you understand care options if your condition(s) worsen?: Yes-patient verbalized understanding    SIGNATURE Julian Lemmings, LPN [facility] Nurse Health Advisor Direct Dial [phone]  "

## 2024-03-17 NOTE — Telephone Encounter (Signed)
 Per TOC note, she is not wanting to come into the office. I sent a message regarding this. See chart. If they are wanting to schedule - ok to schedule appt 03/28/24 at 12:00.

## 2024-03-18 ENCOUNTER — Encounter: Payer: Self-pay | Admitting: Internal Medicine

## 2024-03-18 ENCOUNTER — Telehealth: Payer: Self-pay

## 2024-03-18 NOTE — Telephone Encounter (Signed)
 Called pt's phone number, was answered by caregiver Trish.  Relayed Dr. Freda message concerning the need for pt to be evaluated in ED due to the symptoms pt was experiencing.  Trish reported that she would call Joanna.  Did not refuse ED but did not endorse that she would be calling 911 at this time.     Anita Walker please check in on pt in the morning.  Unsure of if she went to ED.

## 2024-03-18 NOTE — Telephone Encounter (Signed)
 Please call and see how pt is doing now. Need to know if they are weighing daily. If not, needs to start weighing daily.  Per discharge, she is off torsemide. Also, need to confirm if increased sob, etc. If increased sob, weight gain, etc -may need to restart torsemide and will need to be evaluated.  Please confirm how blood pressure is doing. Also, did they have oxygen  at home and has she been using it. Per note, was placed on 2 liters of oxygen . Is she still needing. Ok for home health orders and PT.

## 2024-03-18 NOTE — Telephone Encounter (Signed)
 Copied from CRM [id number]. Topic: General - Other >> Mar 18, 2024 11:17 AM Deleta RAMAN wrote: Reason for CRM: patient daughter is requesting nurse to give a call in reference to discharge of skilled nursing [phone]

## 2024-03-18 NOTE — Telephone Encounter (Signed)
 Returned phone all from [place], [place] with [facility].  Let her know that Dr. Glendia approved PT orders.  Damien was in an area that did not have good service, lost her twice during the phone call.    Attempted to call pt, phone was answered by Carley, caregiver who reported that pt was taking a breathing treatment (deoneb).  Caregiver reported that patient came home from rehab on Saturday 03/15/24.  Unsure of last time pt was weight prior today, weight today 221lbs.  BP taken while writer was on phone 132/79 and HR 94.  Encouraged caregiver to get a notebook to document daily weights and vital signs, she verbalized agreement.  Pt is experiencing increased SOB, with O2 sats dropping in the 80s with ambulation.  If O2 drops below 90 pt is given time to rest and 2 L oxygen  started until she improves. Caregiver reports that patient has a productive cough with green mucus.  She reports that patient was supposed to come home with Pumicort nebulizers too.  Torsemide was stopped while in rehab, caregiver reports that they have medication in home she reports was previously taking 20mg  2 tabs once daily.  Let her know to continue holding medication until we hear back from Dr. Glendia.    Patient needs hospital follow up appointment as well but she is refusing to walk down front stairs.  Family in the process of getting a ramp installed but unsure of when it will be completed.

## 2024-03-18 NOTE — Telephone Encounter (Signed)
 With increased congestion and decreased oxygen  and other symptoms, she needs ER evaluation - to be able to get further tests to determine etiology of symptoms (infection vs increased fluid). Continue nebs. Ok to send in pulmicort  nebs. Robitussin DM.  Weight daily. If refuses ER - call with update.

## 2024-03-18 NOTE — Telephone Encounter (Signed)
 Attempted to call daughter no answer.  See telephone call dated for 03/18/24 for documentation.

## 2024-03-18 NOTE — Telephone Encounter (Signed)
 Called pt's daughter to see how pt was doing. Daughter will be calling office back onc she speaks with pt and care giver. Please transfer call to [facility] and let pts daughter speak to some in office thx

## 2024-03-18 NOTE — Telephone Encounter (Signed)
 Copied from CRM [id number]. Topic: Clinical - Home Health Verbal Orders >> Mar 18, 2024  2:08 PM Thersia BROCKS wrote: Caller/Agency: [facility] - can leave voicemail  Callback Number: [phone] Service Requested: Physical Therapy  // home health aid  Frequency: 1 week 1 2 week 3 , 1 week 5  1 week 1, 2 week 3  Any new concerns about the patient? Yes, has a wet cough hasn't been able to cough, lungs has abnormal sound , 2 liters in oxygen  in home   At rest sitting at 94%  Walk to bathroom dropped to 80% So put 2 liters on her   Has to pulmicort  is not in the home

## 2024-03-19 ENCOUNTER — Encounter: Payer: Self-pay | Admitting: Internal Medicine

## 2024-03-19 MED ORDER — BUDESONIDE 0.5 MG/2ML IN SUSP: 0.5000 mg | Freq: Two times a day (BID) | RESPIRATORY_TRACT | 3 refills | Status: AC

## 2024-03-19 NOTE — Addendum Note (Signed)
 Addended by: GLENDIA ALLENA RAMAN on: 03/19/2024 03:36 AM   Modules accepted: Orders

## 2024-03-19 NOTE — Telephone Encounter (Signed)
 Reviewed. Discussed with Rosina. Dsicussed need for evaluation - see previous notes. I have sent in rx for pulmicort  nebs. Continue neb treatments. See previous note for recommendations. F/u to confirm evaluated.

## 2024-03-19 NOTE — Telephone Encounter (Signed)
 FYI ONLY

## 2024-03-20 NOTE — Care Plan (Signed)
 Transition of Care Encounter Data   Call attempt: 1 Admission date: 02/13/24 Discharge date: 03/15/24 Discharge diagnosis: copd Patient post discharge: Medications:         [facility]: [phone]: [phone]:    Other: Contact PCP:      I spoke with the pt's daughter for this transitions call. The daughter stated the pt is currently in the hospital for her COPD. She stated they have not given a discharge date. She will callback when her mother is discharged. She is aware the SNF [facility] referral was canceled and will need a new referral sent to [facility] for all necessary services. She stated her mother will definitely need nursing, PT, OT, and an aide. This caller notified [facility] of the pt's current hospitalization.   After a thorough review of the chart this patient does not meet criteria for a Transitions Call at this time. Reason for Disqualification :Currently Inpatient. Please contact [phone] with any further questions.            Kathryn E Wehe

## 2024-03-22 NOTE — Discharge Summary (Signed)
 " Physician Discharge Summary [facility] 2 BT2 [facility] [address] [place] [address] Dept: [phone] Loc: [phone]   Identifying Information:  Anita Walker Aug 10, 1940 [id number]  Primary Care Physician: Glendia Allena Flight, MD  Code Status: DNR and DNI  Admit Date: 03/18/2024  Discharge Date: 03/22/2024   Discharge To: Home with Home Health and/or PT/OT  Discharge Service: HBR - HBR: Hospitalist [facility]   Discharge Attending Physician: Morna Merna Ellen, MD  Discharge Diagnoses: Principal Problem:   COPD exacerbation    (CMS-HCC) (POA: Yes) Active Problems:   (HFpEF) heart failure with preserved ejection fraction (CMS-HCC) (POA: Yes)   Paroxysmal A-fib (CMS-HCC) (POA: Yes)   Acute bronchitis due to human metapneumovirus (POA: Yes) Resolved Problems:   * No resolved hospital problems. *   Outpatient Provider Follow Up Issues:  [  ] high resolution CT chest in outpatient setting to evaluate underlying lung disease [  ] outpatient echocardiogram to evaluate systolic murmur  Hospital Course:  Anita Walker is an 84 yo F with hx possible COPD, chronic hypoxemia on intermittent O2 (2L [place]), HTN, pAF and prior PE on Eliquis, CAD, NIDDM2, mixed vascular/Alzheimer dementia, OSA on CPAP who presented with exacerbation of chronic lung disease in setting of metapneumovirus infeciton.   Chronic lung disease (possible COPD) with acute exacebration Metapneumovirus infection Chronic hypoxemia on intermittent home O2 (2L [place]) Reported hx COPD, follows with Pulmonology at [facility]. Uses 2L [place] as needed at baseline (based on symptoms and home pulse oximetry). 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 Previously on Trelegy, but stopped as she had trouble with inhaler technique 2/2 dementia, although she is able to use nebulizer treatments. Presented with increased cough, sputum production, wheezing, and ambulatory hypoxemia to 86% on RA at home, suspicious for flare of chronic lung disease or acute bronchitis superimposed on chronic lung disease. She tested positive for metapneumovirus, the likely trigger of her symptoms. LRCx grew OP flora. CXR showed coarse interstitial markings consistent with acute or chronic small airway inflammation. Pulmonology consulted. She was treated with nebulized budesonide  and arformoterol, scheduled DuoNebs, prednisone  x 5d (end date 03/23/24), and doxycycline  x 5d (end date 03/24/24) to cover possible superimposed bacterial pneumonia. Pulmonology recommended continuing nebulized LABA/ICS and starting azithromycin  daily (after completion of doxycycline ) to reduce frequency of flares in case lung disease is COPD, asthma, or bronchiectasis after discharge. She was weaned to 0.5-1L [place] by discharge. Recommend high-res CT in outpatient setting of after resolution of acute illness to better evaluate underlying lung disease.   HFpEF Systolic murmur Appeared relatively euvolemic on exam. Has been off torsemide since 02/2024. Pro-BNP mildly elevated but stable from prior. CXR with coarse interstitial markings that could suggest some pulmonary edema vs small airways inflammation  Appeared relatively euvolemic on exam and had AKI 2/2 diuresis during last admission, so diuresis was deferred this admission. Was noted to have prominent systolic murmur on exam once wheezing improved; appears murmur may be new as of late last year based on prior documented exams and discussion with patient's daughter.  Last TTE 06/2023 showed moderate mitral annular calcification, but no significant valvular dysfunction. Recommended repeat TTE to evaluate murmur in outpatient setting.     NIDDM2 HbA1c 6.0 in 02/2024. Not on medications at baseline.  Had some mild steroid-induced hyperglycemia that was managed with sliding scale insulin , which was not continued on discharge.    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 Continued home Eliquis.   GERD: continued PPI  Procedures: None No admission procedures for hospital encounter. ______________________________________________________________________ Discharge Medications:   Your Medication List     PAUSE taking these medications    torsemide 20 MG tablet Wait to take this until your doctor or other care provider tells you to start again. Commonly known as: DEMADEX Take 2 tablets (40 mg total) by mouth daily.       STOP taking these medications    losartan  50 MG tablet Commonly known as: COZAAR        START taking these medications    arformoterol 15 mcg/2 mL Commonly known as: BROVANA Inhale 2 mL (15 mcg total) by nebulization in the morning and 2 mL (15 mcg total) before bedtime.   azithromycin  250 MG tablet Commonly known as: ZITHROMAX  Take 1 tablet (250 mg total) by mouth daily. Start taking on 03/25/24 Start taking on: March 25, 2024   doxycycline  100 MG tablet Commonly known as: VIBRA -TABS Take 1 tablet (100 mg total) by mouth two (2) times a day for 5 doses. End date 03/24/24   predniSONE  20 MG tablet Commonly known as: DELTASONE  Take 2 tablets (40 mg total) by mouth in the morning for 1 day. Take on 03/23/24.       CHANGE how you take these medications    acetaminophen  500 MG tablet Commonly known as: TYLENOL  Take 1 tablet (500 mg total) by mouth every six (6) hours as needed for pain. What changed:  how much to take reasons to take this   albuterol  90 mcg/actuation inhaler Commonly known as: PROVENTIL  HFA;VENTOLIN  HFA Inhale 2 puffs every four (4) hours as needed. What changed: Another medication with the same  name was removed. Continue taking this medication, and follow the directions you see here.   budesonide  0.5 mg/2 mL nebulizer solution Commonly known as: PULMICORT  Inhale 2 mL (0.5 mg total) by nebulization two (2) times a day. What changed: when to take this   ipratropium-albuterol  0.5-2.5 mg/3 mL nebulizer Commonly known as: DUO-NEB Inhale 3 mL by nebulization four (4) times a day for 5 days, THEN 3 mL every six (6) hours as needed (shortness of breath/wheezing). Start taking on: March 21, 2024 What changed: See the new instructions.   nystatin 100,000 unit/gram powder Commonly known as: MYCOSTATIN Apply topically two (2) times a day as needed. Apply to affected areas (skin folds). What changed:  how to take this when to take this reasons to take this additional instructions       CONTINUE taking these medications    apixaban 5 mg Tab Commonly known as: ELIQUIS Take 1 tablet (5 mg total) by mouth two (2) times a day.   blood sugar diagnostic Strp Check one time day  Dx E11.9   buspirone  5 MG tablet Commonly known as: BUSPAR  Take 1 tablet (5 mg total) by mouth in the morning.   escitalopram  oxalate 10 MG tablet Commonly known as: LEXAPRO  Take 1 tablet (10 mg total) by mouth daily.   fluticasone  propionate 50 mcg/actuation nasal spray Commonly known as: FLONASE  2 sprays into each nostril every six (6) hours as needed for rhinitis.   multivitamin per tablet Commonly known as: TAB-A-VITE/THERAGRAN Take 1 tablet by mouth daily.  ondansetron  4 MG disintegrating tablet Commonly known as: ZOFRAN -ODT Dissolve 1 tablet (4 mg total) in the mouth every eight (8) hours as needed for nausea.   pantoprazole  40 MG tablet Commonly known as: Protonix  Take 1 tablet (40 mg total) by mouth in the morning.   polyethylene glycol 17 gram packet Commonly known as: MIRALAX  Take 17 g by mouth two (2) times a day.   QUEtiapine 25 MG tablet Commonly known as: SEROQUEL Take 1  tablet (25 mg total) by mouth nightly.   simvastatin  10 MG tablet Commonly known as: ZOCOR  Take 1 tablet (10 mg total) by mouth daily.        Allergies: Penicillins ______________________________________________________________________ Pending Test Results (if blank, then none): Pending Labs     Order Current Status   Lower Respiratory Culture Preliminary result       Most Recent Labs: All lab results last 24 hours -  Recent Results (from the past 24 hours)  POCT Glucose   Collection Time: 03/21/24 12:14 PM  Result Value Ref Range   Glucose, POC 195 (H) 70 - 179 mg/dL  POCT Glucose   Collection Time: 03/21/24  3:38 PM  Result Value Ref Range   Glucose, POC 181 (H) 70 - 179 mg/dL  Basic Metabolic Panel   Collection Time: 03/22/24  6:47 AM  Result Value Ref Range   Sodium 139 135 - 145 mmol/L   Potassium 4.7 3.5 - 5.1 mmol/L   Chloride 97 (L) 98 - 107 mmol/L   CO2 29.1 20.0 - 31.0 mmol/L   Anion Gap 13 5 - 14 mmol/L   BUN 17 9 - 23 mg/dL   Creatinine 9.36 9.44 - 1.02 mg/dL   BUN/Creatinine Ratio 27    eGFR CKD-EPI (2021) Female 87 >=60 mL/min/1.98m2   Glucose 100 70 - 179 mg/dL   Calcium 9.7 8.7 - 89.5 mg/dL  CBC   Collection Time: 03/22/24  6:47 AM  Result Value Ref Range   WBC 6.5 3.6 - 11.2 10*9/L   RBC 4.16 3.95 - 5.13 10*12/L   HGB 10.8 (L) 11.3 - 14.9 g/dL   HCT 67.5 (L) 65.9 - 55.9 %   MCV 77.8 77.6 - 95.7 fL   MCH 26.1 25.9 - 32.4 pg   MCHC 33.5 32.0 - 36.0 g/dL   RDW 80.9 (H) 87.7 - 84.7 %   MPV 7.7 6.8 - 10.7 fL   Platelet 236 150 - 450 10*9/L   Microbiology -  Microbiology Results (last day)     Procedure Component Value Date/Time Date/Time   Lower Respiratory Culture [[id number]] Collected: 03/19/24 1751   Lab Status: Preliminary result Specimen: Sputum Expectorated Updated: 03/22/24 0913    Lower Respiratory Culture OROPHARYNGEAL FLORA ISOLATED    Gram Stain <10  Epithelial cells/LPF     >25 PMNS/LPF     4+ Gram positive bacilli     1+  Gram positive cocci     Acceptable for culture   Narrative:     Specimen Source: Sputum Expectorated       Relevant Studies/Radiology (if blank, then none): ECG 12 Lead Result Date: 03/20/2024 SINUS RHYTHM WITH PREMATURE SUPRAVENTRICULAR BEATS MINIMAL VOLTAGE CRITERIA FOR LVH, MAY BE NORMAL VARIANT ( [employer] product ) PROLONGED QT ABNORMAL ECG WHEN COMPARED WITH ECG OF 09-Feb-2024 19:24, ST NO LONGER ELEVATED IN LATERAL LEADS NONSPECIFIC T WAVE ABNORMALITY NO LONGER EVIDENT IN INFERIOR LEADS Confirmed by Antonetta Gull ([id number]) on 03/20/2024 2:19:35 PM  XR Chest 2 views Result Date: 03/18/2024 EXAM:  XR CHEST 2 VIEWS ACCESSION: [id number] UN REPORT DATE: 03/18/2024 7:26 PM CLINICAL INDICATION: CHEST PAIN ; Chest Pain  TECHNIQUE: PA and Lateral Chest Radiographs COMPARISON: Chest radiograph 02/10/2024 FINDINGS: The patient's chin partially obscures visualization of the apices. There is improved aeration of the left lower lobe. There is diffuse coarsening of the interstitial marking pattern, similar to the prior.. There is no pleural effusion or pneumothorax The cardiomediastinal silhouette is stable. Again noted is a large hiatal hernia   Improved aeration of the left lower lobe. Coarse interstitial markings without evidence of pneumonia. This is a nonspecific finding as can be seen with acute and/or chronic small airway inflammation. Large hiatal hernia.   ______________________________________________________________________ Discharge Instructions:   Diet Instructions     Discharge diet (specify)     Discharge Nutrition Therapy: Regular       Follow Up instructions and Outpatient Referrals    Ambulatory Referral to Home Health     Reason for referral: Home health care   Is this a [facility] or [facility] Patient?: No   Physician to follow patient's care: PCP   Disciplines requested:  Physical Therapy Occupational Therapy Nursing     Nursing requested: Teaching/skilled  observation and assessment   What teaching is needed (new diagnosis? new medications?): Medication  management and general assessments   Physical Therapy requested:  Home safety evaluation Evaluate and treat     Occupational Therapy Requested:  Home safety evaluation ADL or IADL training     Requested SOC Date:  Comment - within 48H   Call MD for:  difficulty breathing, headache or visual disturbances     Call MD for:  extreme fatigue     Call MD for:  persistent dizziness or light-headedness     Discharge instructions       Other Instructions     Call MD for:  difficulty breathing, headache or visual disturbances     Call MD for:  extreme fatigue     Call MD for:  persistent dizziness or light-headedness     Discharge instructions     You were admitted to the hospital due to shortness of breath and wheezing and low oxygen  levels. You tested positive for metapneumovirus, a respiratory virus that can cause all of the symptoms you were having. This likely caused a flare up of your COPD (chronic lung disease). Unfortunately, there is no specific treatment for the virus infection - your body's immune system will get rid of the infection in time. Your chest X-ray didn't show any signs of pneumonia (bacterial lung infection), but just in case you do have a superimposed pneumonia causing your symptoms, we did treat you with an oral antibiotic called doxycycline  that will treat the most common causes of pneumonia as well.   When you go home, you will need to take doxycycline  for a few more doses (last dose 03/24/24) to completely treat any pneumonia. You will also need to take another dose of prednisone  on the morning of Sunday 03/23/24. We have started you on two new nebulized medications that you should take twice a day, morning and evening, to prevent future flare-ups of your COPD - they are budesonide  (Pulmicort ) and arformoterol ([place]). These medicines are similar to the medicines that were in  your old Trelegy inhaler, but can be taken by nebulizer instead. In addition, you should use DuoNebs four times a day (about every 6 hours during waking hours) for the next 5-7 days, or until your breathing has  improved back to your baseline, after which you can use the DuoNebs every 6 hours just as needed for shortness of breath or wheezing.   We recommend that you continue to use oxygen  by nasal cannula at 1L per minute at all times for the next few days. After 5-7 days (or when you follow up with your primary care doctor), you can try coming off the oxygen  and monitoring your pulse oximetry. You should wear oxygen  and/or increase the flow rate if your oxygen  saturation is less than 89% on your pulse oximeter. Your goal oxygen  level is 89-92%.   While you were here, we noticed you had a loud heart murmur, which may be a new finding. We recommend that your primary care doctor help you get an echocardiogram (ultrasound of your heart) to check in on your heart valves to investigate the source of this murmur.   Discharge instructions to patient: Call your Specialist doctor and make an appointment to see them (specify):     Specialist: [facility] Within 2-4 weeks from the time you are discharged from the hospital   Discharge instructions to patient: Call your primary care doctor and make an appointment to see them:     Within 2 weeks from the time you are discharged from the hospital        ______________________________________________________________________ Discharge Day Services: BP 141/67   Pulse 76   Temp 36.3 C (97.3 F) (Oral)   Resp 18   Ht 170.2 cm (5' 7.01)   Wt (!) 105.6 kg (232 lb 12.9 oz)   SpO2 94%   BMI 36.45 kg/m  Pt seen on the day of discharge and determined appropriate for discharge.  Condition at Discharge: stable  Length of Discharge: I spent greater than 30 mins in the discharge of this patient.  "

## 2024-03-24 ENCOUNTER — Telehealth: Payer: Self-pay

## 2024-03-24 NOTE — Patient Instructions (Addendum)
 Visit Information  Thank you for taking time to visit with me today. Please don't hesitate to contact me if I can be of assistance to you before our next scheduled telephone appointment.  Our next appointment is by telephone on 04/02/2024 at 12:30 pm Olam Ku RN case manager  Following is a copy of your care plan:   Goals Addressed             This Visit's Progress    VBCI Transitions of Care (TOC) Care Plan       Problems:  Recent Hospitalization for treatment of COPD No Hospital Follow Up Provider appointment offered to assist with scheduling hospital follow up visit daughter declined stating she will schedule and No Specialist appointment daughter states she will arrange  Goal:  Over the next 30 days, the patient will not experience hospital readmission  Interventions:   COPD Interventions: Advised patient to track and manage COPD triggers Advised patient to self assesses COPD action plan zone and make appointment with provider if in the yellow zone for 48 hours without improvement Assessed social determinant of health barriers Discussed the importance of adequate rest and management of fatigue with COPD Provided education about and advised patient to utilize infection prevention strategies to reduce risk of respiratory infection Provided instruction about proper use of medications used for management of COPD including inhalers Screening for signs and symptoms of depression related to chronic disease state  Use of home oxygen  Provided COPD education related COPD action plan and verified patient remains in the [place] zone.  Patient Self Care Activities:  Attend all scheduled provider appointments Call pharmacy for medication refills 3-7 days in advance of running out of medications Call provider office for new concerns or questions  Notify RN Care Manager of TOC call rescheduling needs Participate in Transition of Care Program/Attend TOC scheduled calls Take medications  as prescribed   follow rescue plan if symptoms flare-up keep follow-up appointments:    Plan:  Telephone follow up appointment with care management team member scheduled for:  04/02/2024 @ 12:30pm The patient has been provided with contact information for the care management team and has been advised to call with any health related questions or concerns.         Patient verbalizes understanding of instructions and care plan provided today and agrees to view in MyChart. Active MyChart status and patient understanding of how to access instructions and care plan via MyChart confirmed with patient.     The patient has been provided with contact information for the care management team and has been advised to call with any health related questions or concerns.   Please call the care guide team at [phone] if you need to cancel or reschedule your appointment.   Please call the Suicide and Crisis Lifeline: 988 call the [place]  National Suicide Prevention Lifeline: 765 461 8042 or TTY: 678-848-6084 TTY (272) 226-6294) to talk to a trained counselor call 1-800-273-TALK (toll free, 24 hour hotline) if you are experiencing a Mental Health or Behavioral Health Crisis or need someone to talk to.  Arvin Seip RN, BSN, CCM [employer], Population Health Case Manager Phone: [phone]

## 2024-03-24 NOTE — Transitions of Care (Post Inpatient/ED Visit) (Addendum)
 "  03/24/2024  Name: Anita Walker MRN: [id number] DOB: 10-20-1940  Today's TOC FU Call Status: Today's TOC FU Call Status:: Successful TOC FU Call Completed TOC FU Call Complete Date: 03/24/24  Patient's Name and Date of Birth confirmed. Name, DOB (HIPAA and assessment completed with the daughter/drp Philippe Silversmith)  Transition Care Management Follow-up Telephone Call Date of Discharge: 03/22/24 Discharge Facility: Other ([facility]) Name of Other ([facility]) Discharge Facility: [facility] Type of Discharge: Inpatient Admission Primary Inpatient Discharge Diagnosis:: COPD Exacerbation How have you been since you were released from the hospital?: Better Any questions or concerns?: No  Items Reviewed: Did you receive and understand the discharge instructions provided?: Yes Medications obtained,verified, and reconciled?: Yes (Medications Reviewed) Any new allergies since your discharge?: No Dietary orders reviewed?: Yes Type of Diet Ordered:: Low salt-heart healthy Do you have support at home?: Yes People in Home [RPT]: child(ren), adult, other relative(s) Name of Support/Comfort Primary Source: daughter reports her spouse also assist in care for patient. Also reports a sitter in the home from 11-6pm during the week  Medications Reviewed Today: Medications Reviewed Today     Reviewed by Rontrell Moquin E, RN (Registered Nurse) on 03/24/24 at 1253  Med List Status: <None>   Medication Order Taking? Sig Documenting Provider Last Dose Status Informant  acetaminophen  (TYLENOL ) 325 MG tablet [id number] Yes Take by mouth.  Patient taking differently: Take 500 mg by mouth every 6 (six) hours as needed.   [provider]  Active   albuterol  (PROVENTIL  HFA) 108 (90 Base) MCG/ACT inhaler [id number] Yes Inhale 2 puffs into the lungs every 4 (four) hours as needed for wheezing or shortness of breath. [provider]  Active   alum & mag hydroxide-simeth (MAALOX/MYLANTA)  200-200-20 MG/5ML suspension [id number] Yes Take 15 mLs by mouth every 6 (six) hours as needed for indigestion or heartburn. Darci Pore, MD  Active   apixaban (ELIQUIS) 5 MG TABS tablet [id number] Yes Take 5 mg by mouth 2 (two) times daily. [provider]  Active   arformoterol (BROVANA) 15 MCG/2ML NEBU [id number] Yes Take 15 mcg by nebulization 2 (two) times daily. [provider]  Active   azithromycin  (ZITHROMAX ) 250 MG tablet [id number] Yes Take 250 mg by mouth daily. Start on 03/25/2024 [provider]  Active   budesonide  (PULMICORT ) 0.5 MG/2ML nebulizer solution [id number] Yes Take 2 mLs (0.5 mg total) by nebulization 2 (two) times daily. Glendia Shad, MD  Active   busPIRone  (BUSPAR ) 5 MG tablet [id number] Yes Take 1 tablet (5 mg total) by mouth daily as needed.  Patient taking differently: Take 5 mg by mouth in the morning.   Glendia Shad, MD  Active   diphenhydrAMINE  (BENADRYL ) 25 mg capsule [id number] Yes Take 25 mg at bedtime as needed by mouth for sleep.  [provider]  Active Child  Docusate Calcium (STOOL SOFTENER PO) [id number] Yes Take by mouth. [provider]  Active   doxycycline  (ADOXA) 100 MG tablet [id number] Yes Take 100 mg by mouth 2 (two) times daily. Take for 5 days with end date 03/24/2024 [provider]  Active   escitalopram  (LEXAPRO ) 10 MG tablet [id number] Yes Take 1 tablet (10 mg total) by mouth daily. Glendia Shad, MD  Active   feeding supplement (ENSURE ENLIVE / ENSURE PLUS) LIQD [id number] Yes Take 237 mLs by mouth 2 (two) times daily between meals. Tobie Calix, MD  Active Child  fluticasone  (FLONASE ) 50 MCG/ACT nasal spray [id number] Yes Place 2 sprays  into both nostrils daily.  Patient taking differently: Place 2 sprays into both nostrils every 6 (six) hours as needed.   [provider]  Active   glucose blood (CONTOUR TEST) test strip [id number] Yes USE TO TEST BLOOD SUGAR ONCE DAILY Glendia Shad, MD  Active Child  ipratropium-albuterol  (DUONEB) 0.5-2.5 (3) MG/3ML SOLN [id number] Yes Take 3 mLs by nebulization every 4 (four) hours as needed. Inhale 3 mL by nebulization four (4) times a day for 5 days, THEN 3 mL every six (6) hours as needed (shortness of breath/wheezing). [provider]  Active   losartan  (COZAAR ) 25 MG tablet [id number]  Take 1 tablet (25 mg total) by mouth daily.  Patient not taking: Reported on 03/24/2024   Glendia Shad, MD  Active   Multiple Vitamin (MULTIVITAMIN WITH MINERALS) TABS tablet [id number] Yes Take 1 tablet daily by mouth. [provider]  Active Child  nystatin powder [id number] Yes Apply 1 Application topically 2 (two) times daily. Apply topically two (2) times a day as needed. Apply to affected areas (skin folds). [provider]  Active   ondansetron  (ZOFRAN -ODT) 4 MG disintegrating tablet [id number] Yes Take 1 tablet (4 mg total) by mouth every 8 (eight) hours as needed for nausea or vomiting. Narendra, Nischal, MD  Active   pantoprazole  (PROTONIX ) 40 MG tablet [id number] Yes TAKE 1 TABLET(40 MG) BY MOUTH DAILY Glendia Shad, MD  Active   polyethylene glycol (MIRALAX  / GLYCOLAX ) 17 g packet [id number] Yes Take 17 g by mouth 2 (two) times daily. [provider]  Active   predniSONE  (DELTASONE ) 20 MG tablet [id number] Yes Take 20 mg by mouth daily with breakfast. Take 2 tablets (40 mg total) by mouth in the morning for 1 day. Take on 03/23/24-Daughter reports patient completed dose [provider]  Active   QUEtiapine (SEROQUEL) 25 MG tablet [id number] Yes Take 25 mg by mouth at bedtime. [provider]  Active   simvastatin  (ZOCOR ) 10 MG tablet [id number] Yes Take 1 tablet (10 mg total) by mouth daily. Glendia Shad, MD  Active   torsemide (DEMADEX) 20 MG tablet [id number]  Take 20 mg by mouth daily.  Patient not taking: Reported on 03/24/2024   [provider]  Active            Med Note  DORI, Delane Stalling E   Fri Jun 22, 2023  9:30 AM) Daughter states patient takes 1 tablet 2 x per day.             Home Care and Equipment/Supplies: Were Home Health Services Ordered?: Yes Name of Home Health Agency:: [facility] Has Agency set up a time to come to your home?: No First Home Health Visit Date:  (pending with [facility]) EMR reviewed for Home Health Orders: Orders present/patient has not received call (refer to CM for follow-up) (Daughter indicates she is aware of the agency and will follow up accordingly for an appointment) Any new equipment or medical supplies ordered?: No Were you able to get the equipment/medical supplies?: No Do you have any questions related to the use of the equipment/supplies?: No  Functional Questionnaire: Do you need assistance with bathing/showering or dressing?: Yes (Aide assist with this task) Do you need assistance with meal preparation?: Yes (Family/Aide assist with this task) Do you need assistance with eating?: No Do you have difficulty maintaining continence: Yes (Aide assist with this task) Do you need assistance with getting out of bed/getting out of a chair/moving?: No  Do you have difficulty managing or taking your medications?: Yes (family/aide assist with this task)  Follow up appointments reviewed: PCP Follow-up appointment confirmed?: No (Daughter will make the follow up appointment) Specialist Hospital Follow-up appointment confirmed?: Yes Date of Specialist follow-up appointment?:  (Daughter reports she will follow up with pulmonologist for an appointment) Follow-Up Specialty Provider:: [facility] Do you need transportation to your follow-up appointment?: No Do you understand care options if your condition(s) worsen?: Yes-patient verbalized understanding  SDOH Interventions Today    Flowsheet Row Most Recent Value  SDOH Interventions   Food Insecurity Interventions Intervention Not Indicated  Housing  Interventions Intervention Not Indicated  Transportation Interventions Intervention Not Indicated  Utilities Interventions Intervention Not Indicated  Discussed and offered 30 day TOC program.  Patient's daughter Braya Habermehl verbally agreed. Patient to be followed by Olam Ku RN case manager for ongoing 30 day [facility] program.  The patient has been provided with contact information for the care management team and has been advised to call with any health -related questions or concerns.  The patient verbalized understanding with current plan of care.  The patient is directed to their insurance card regarding availability of benefits coverage.    Arvin Seip RN, BSN, CCM [employer], Population Health Case Manager Phone: [phone]  "

## 2024-03-25 ENCOUNTER — Telehealth: Payer: Self-pay

## 2024-03-25 NOTE — Telephone Encounter (Signed)
 Copied from CRM [id number]. Topic: Clinical - Home Health Verbal Orders >> Mar 25, 2024  9:55 AM Suzen RAMAN wrote: Caller/Agency: [facility]  Callback Number: [phone]  Service Requested: Occupational Therapy, Physical Therapy & Skilled Nursing Frequency: TBD; order to resume home health care Any new concerns about the patient? No  **also would like a call back to answer the additional question from provider**

## 2024-03-26 NOTE — Telephone Encounter (Signed)
 Spoke to nurse Verneita. Stated that they will be going out to pt's home today for re-eval. Verneita stated she would have agency call back with an update on pt.

## 2024-03-26 NOTE — Telephone Encounter (Signed)
 Ok for home health orders. Let us  know if needs anything or any problems.

## 2024-03-26 NOTE — Telephone Encounter (Unsigned)
 Copied from CRM [id number]. Topic: Clinical - Home Health Verbal Orders >> Mar 26, 2024 12:32 PM Alfonso HERO wrote: Caller/Agency: Damien [facility] / [facility]  Callback Number: [phone]  Service Requested: Physical Therapy, HHA, Nurse Eval  Frequency: PT 1 week 1, 2 week 3, 1 week 4.... HHA once a week for 4 weeks  Any new concerns about the patient? Vitals: Temp 97.1, Weight 230, Bp 128/68, O2 @ rest 99% O2 walking without oxygen  on 90%, Heart rate walking 106% not walking 78%, No fluid in the lungs but a little rattling.

## 2024-03-26 NOTE — Telephone Encounter (Signed)
 Orders given.

## 2024-03-27 ENCOUNTER — Other Ambulatory Visit (HOSPITAL_COMMUNITY): Payer: Self-pay

## 2024-03-27 ENCOUNTER — Telehealth: Payer: Self-pay

## 2024-03-27 NOTE — Telephone Encounter (Signed)
 Pharmacy Patient Advocate Encounter   Received notification from [facility] that prior authorization for Buesonide 0.5mg /2 ml neb solution is required/requested.   Insurance verification completed.   The patient is insured through [employer].   Per test claim: Per test claim, medication is not covered due to plan/benefit exclusion, PA not submitted at this time  COVERED BY MEDICARE PART B

## 2024-03-28 ENCOUNTER — Other Ambulatory Visit (HOSPITAL_COMMUNITY): Payer: Self-pay

## 2024-04-01 ENCOUNTER — Telehealth: Payer: Self-pay | Admitting: Internal Medicine

## 2024-04-01 NOTE — Telephone Encounter (Signed)
 Ok to give verbal orders. Please contact her oxygen  company and give orders requested for oxygen  (extender, etc)  as outlined.

## 2024-04-01 NOTE — Telephone Encounter (Signed)
 Copied from CRM #[id number]. Topic: Clinical - Home Health Verbal Orders >> Mar 31, 2024 10:45 AM Maisie BROCKS wrote: Caller/Agency: stephanie rice w/ [facility] Callback Number: [phone] Service Requested: Occupational Therapy Frequency: 1x/week for 4 weeks Any new concerns about the patient? Yes, she recommends she gets an oxygen  extender & additional tubing

## 2024-04-02 ENCOUNTER — Telehealth: Payer: Self-pay | Admitting: *Deleted

## 2024-04-02 NOTE — Telephone Encounter (Signed)
 Spoke to Grace and gave verbal orders otp per Dr Glendia

## 2024-04-02 NOTE — Transitions of Care (Post Inpatient/ED Visit) (Signed)
 " Transition of Care week 2  Visit Note  04/02/2024  Name: Anita Walker MRN: [id number]          DOB: 1940-08-07  Situation: Patient enrolled in [facility] 30-day program. Visit completed with patient/daughter Anita Walker) by telephone.   Background:   Initial Transition Care Management Follow-up Telephone Call Discharge Date and Diagnosis: 03/22/24, COPD Exacerbation   Past Medical History:  Diagnosis Date   Anemia    Anxiety    Arthritis    Atrial fibrillation (HCC)    Breast cancer (HCC)    s/p lumpectomy 1992.  s/p chemo and xrt left breast   CHF (congestive heart failure) (HCC) 1/25   Clotting disorder    Depression 06/22   Diabetes mellitus (HCC)    Edema    feet/legs   Emphysema of lung (HCC) 2025   Gastric ulcer    GERD (gastroesophageal reflux disease)    HOH (hard of hearing)    aides   Hypercholesterolemia    Hypertension    Myocardial infarction [facility]) April 2025   Personal history of chemotherapy    Pulmonary emboli (HCC)    Sleep apnea    Thyroid  disease 2025    Assessment: Patient Reported Symptoms: Cognitive Cognitive Status: Difficulties with attention and concentration, Confused or disoriented, Poor judgment in daily scenarios, Requires Assistance Decision Making, Normal speech and language skills, Struggling with memory recall Cognitive/Intellectual Conditions Management [RPT]: Other Other: Alzheimers/Dementia   Health Maintenance Behaviors: Annual physical exam  Neurological Neurological Review of Symptoms: Hearing changes Neurological Management Strategies: Routine screening Neurological Comment: States patient wears hearing aids  HEENT HEENT Symptoms Reported: Change or loss of hearing HEENT Management Strategies: Medical device    Cardiovascular Cardiovascular Symptoms Reported: Swelling in legs or feet (History- CHF daughter reports increase swelling with wepping in ther LE-reports torsemide stopped in the hospital due to AKI. Daughter states she  restarted the torsemide on Sunday and did not consult provider. States she will call the Cardiologist today) Cardiovascular Management Strategies: Routine screening, Medication therapy, Coping strategies (Daughter states patient continues decline elevating her LE or wear compression stockings to reduce ongoing swelling)  Respiratory Respiratory Symptoms Reported: Dry cough (Daughter is aware to continues to take nebs and inhalers. Verified patient remains in the GREEN zone with no related breathing issues reported today.) Respiratory Management Strategies: CPAP, Routine screening, Medication therapy, Coping strategies  Endocrine Endocrine Symptoms Reported: No symptoms reported Is patient diabetic?: Yes Is patient checking blood sugars at home?: Yes List most recent blood sugar readings, include date and time of day: Daughter unable to recite any readings. Encouraged weekly readings if possible for ongoing monitoring    Gastrointestinal Gastrointestinal Symptoms Reported: Constipation Additional Gastrointestinal Details: Utilize stool softners when needed. Verified patient is hydrating. Gastrointestinal Management Strategies: Medication therapy, Fluid modification    Genitourinary Genitourinary Symptoms Reported: Incontinence Genitourinary Management Strategies: Incontinence garment/pad  Integumentary Integumentary Symptoms Reported: No symptoms reported    Musculoskeletal Musculoskelatal Symptoms Reviewed: Weakness, Limited mobility, Difficulty walking, Unsteady gait Additional Musculoskeletal Details: Daughter reports history of falls with several fractures. Daughter states recent ramp completed as a prevention measure for a safer access. Musculoskeletal Management Strategies: Routine screening, Medical device      Psychosocial Psychosocial Symptoms Reported: No symptoms reported         There were no vitals filed for this visit. Pain Scale: 0-10 Pain Score: 0-No pain  Medications  Reviewed Today     Reviewed by Alvia Olam BIRCH, RN (Registered Nurse) on  04/02/24 at 1423  Med List Status: <None>   Medication Order Taking? Sig Documenting Provider Last Dose Status Informant  acetaminophen  (TYLENOL ) 325 MG tablet [id number] Yes Take by mouth.  Patient taking differently: Take 500 mg by mouth every 6 (six) hours as needed.   [provider]  Active   albuterol  (PROVENTIL  HFA) 108 (90 Base) MCG/ACT inhaler [id number] Yes Inhale 2 puffs into the lungs every 4 (four) hours as needed for wheezing or shortness of breath. [provider]  Active   alum & mag hydroxide-simeth (MAALOX/MYLANTA) 200-200-20 MG/5ML suspension [id number] Yes Take 15 mLs by mouth every 6 (six) hours as needed for indigestion or heartburn. Darci Pore, MD  Active   apixaban (ELIQUIS) 5 MG TABS tablet [id number] Yes Take 5 mg by mouth 2 (two) times daily. [provider]  Active   arformoterol (BROVANA) 15 MCG/2ML NEBU [id number] Yes Take 15 mcg by nebulization 2 (two) times daily. [provider]  Active   azithromycin  (ZITHROMAX ) 250 MG tablet [id number] Yes Take 250 mg by mouth daily. Start on 03/25/2024 [provider]  Active   budesonide  (PULMICORT ) 0.5 MG/2ML nebulizer solution [id number] Yes Take 2 mLs (0.5 mg total) by nebulization 2 (two) times daily. Glendia Shad, MD  Active   busPIRone  (BUSPAR ) 5 MG tablet [id number] Yes Take 1 tablet (5 mg total) by mouth daily as needed.  Patient taking differently: Take 5 mg by mouth in the morning.   Glendia Shad, MD  Active   diphenhydrAMINE  (BENADRYL ) 25 mg capsule [id number] Yes Take 25 mg at bedtime as needed by mouth for sleep.  [provider]  Active Child  Docusate Calcium (STOOL SOFTENER PO) [id number] Yes Take by mouth. [provider]  Active   doxycycline  (ADOXA) 100 MG tablet [id number]  Take 100 mg by mouth 2 (two) times daily. Take for 5 days with end date 03/24/2024  Patient not  taking: Reported on 04/02/2024   [provider]  Active   escitalopram  (LEXAPRO ) 10 MG tablet [id number] Yes Take 1 tablet (10 mg total) by mouth daily. Glendia Shad, MD  Active   feeding supplement (ENSURE ENLIVE / ENSURE PLUS) LIQD [id number] Yes Take 237 mLs by mouth 2 (two) times daily between meals. Tobie Calix, MD  Active Child  fluticasone  (FLONASE ) 50 MCG/ACT nasal spray [id number] Yes Place 2 sprays into both nostrils daily. [provider]  Active   glucose blood (CONTOUR TEST) test strip [id number] Yes USE TO TEST BLOOD SUGAR ONCE DAILY Glendia Shad, MD  Active Child  ipratropium-albuterol  (DUONEB) 0.5-2.5 (3) MG/3ML SOLN [id number] Yes Take 3 mLs by nebulization every 4 (four) hours as needed. Inhale 3 mL by nebulization four (4) times a day for 5 days, THEN 3 mL every six (6) hours as needed (shortness of breath/wheezing). [provider]  Active   losartan  (COZAAR ) 25 MG tablet [id number]  Take 1 tablet (25 mg total) by mouth daily.  Patient not taking: Reported on 03/24/2024   Glendia Shad, MD  Active   Multiple Vitamin (MULTIVITAMIN WITH MINERALS) TABS tablet [id number] Yes Take 1 tablet daily by mouth. [provider]  Active Child  nystatin powder [id number] Yes Apply 1 Application topically 2 (two) times daily. Apply topically two (2) times a day as needed. Apply to affected areas (skin folds). [provider]  Active   ondansetron  (ZOFRAN -ODT) 4 MG disintegrating tablet [id number] Yes Take 1 tablet (4 mg total) by mouth every 8 (eight) hours as needed for nausea  or vomiting. Narendra, Nischal, MD  Active   pantoprazole  (PROTONIX ) 40 MG tablet [id number] Yes TAKE 1 TABLET(40 MG) BY MOUTH DAILY Glendia Shad, MD  Active   polyethylene glycol (MIRALAX  / GLYCOLAX ) 17 g packet [id number] Yes Take 17 g by mouth 2 (two) times daily. [provider]  Active   predniSONE  (DELTASONE ) 20 MG tablet [id number]  Take 20 mg by mouth daily with  breakfast. Take 2 tablets (40 mg total) by mouth in the morning for 1 day. Take on 03/23/24-Daughter reports patient completed dose  Patient not taking: Reported on 04/02/2024   [provider]  Active   QUEtiapine (SEROQUEL) 25 MG tablet [id number] Yes Take 25 mg by mouth at bedtime. [provider]  Active   simvastatin  (ZOCOR ) 10 MG tablet [id number] Yes Take 1 tablet (10 mg total) by mouth daily. Glendia Shad, MD  Active   torsemide (DEMADEX) 20 MG tablet [id number] Yes Take 20 mg by mouth daily. [provider]  Active            Med Note DORI, DAVINA E   Fri Jun 22, 2023  9:30 AM) Daughter states patient takes 1 tablet 2 x per day.             Recommendation:   PCP Follow-up Specialty provider follow-up Pulmonologist 04/07/24 Continue Current Plan of Care  Follow Up Plan:   Telephone follow-up in 1 week   Olam Ku, RN, BSN [place]  [facility], [facility] RN Care Manager Direct Dial: [phone]  Fax: [phone]      "

## 2024-04-02 NOTE — Patient Instructions (Addendum)
 Visit Information  Thank you for taking time to visit with me today. Please don't hesitate to contact me if I can be of assistance to you before our next scheduled telephone appointment.  Our next appointment is by telephone on 04/10/2024 at 1:00 pm  Following is a copy of your care plan:   Goals Addressed             This Visit's Progress    VBCI Transitions of Care (TOC) Care Plan       Problems:  Recent Hospitalization for treatment of COPD Follow up appointments were delayed due to pt refusal to leave the home prior to building a ramp now completed outside the home for better transport access.   Goal:  Over the next 30 days, the patient will not experience hospital readmission  Interventions:  CHF- Message sent to primary care provider informing her that patient's daughter Arocho) was advise to HOLD off on home torsemide. Daughter has restarted this medication on Sunday 1/25 due to increase swelling. COPD Interventions: Advised patient to track and manage COPD triggers Advised patient to self assesses COPD action plan zone and make appointment with provider if in the yellow zone for 48 hours without improvement Discussed the importance of adequate rest and management of fatigue with COPD Provided education about and advised patient to utilize infection prevention strategies to reduce risk of respiratory infection Provided instruction about proper use of medications used for management of COPD including inhalers Use of home oxygen  Advised daughter/patient to attend all schedule hospital follow-up appointments Advised daughter to contact the cardiologist [facility]) concerning antidiuretic (Torsemide) due  to bilaterally swelling related to patient's CHF.  Patient Self Care Activities:  Attend all scheduled provider appointments Call pharmacy for medication refills 3-7 days in advance of running out of medications Call provider office for new concerns or questions  Notify  RN Care Manager of [facility] call rescheduling needs Participate in Transition of Care Program/Attend TOC scheduled calls Take medications as prescribed   follow rescue plan if symptoms flare-up keep follow-up appointments:  Primary provider 04/08/24 and Pulmonologist 04/07/24 Daughter will contact the cardiologist and inquire on the post-op medications for CHF (torsemide)  Plan:  Telephone follow up appointment with care management team member scheduled for:  04/10/24 @ 1:00 pm with Olam Ku RN case manager The patient has been provided with contact information for the care management team and has been advised to call with any health related questions or concerns.         Care plan and visit instructions communicated with the patient verbally today. Patient agrees to receive a copy in MyChart. Active MyChart status and patient understanding of how to access instructions and care plan via MyChart confirmed with patient.     The patient has been provided with contact information for the care management team and has been advised to call with any health related questions or concerns.   Please call the care guide team at [phone] if you need to cancel or reschedule your appointment.   Please call the Suicide and Crisis Lifeline: 988 call the [place]  National Suicide Prevention Lifeline: (787)286-8019 or TTY: (316) 174-0112 TTY 520-059-9196) to talk to a trained counselor call 1-800-273-TALK (toll free, 24 hour hotline) if you are experiencing a Mental Health or Behavioral Health Crisis or need someone to talk to.   Olam Ku, RN, BSN [place]  [facility], [facility] RN Care Manager Direct Dial: [phone]  Fax: [phone]

## 2024-04-07 ENCOUNTER — Telehealth: Payer: Self-pay | Admitting: Internal Medicine

## 2024-04-07 NOTE — Telephone Encounter (Signed)
 Reviewed iformation. Saw cardiology 04/02/24. Recommended level elevation and wraps per note. Instruct them to weight daily. Can arrange f/u with vascular for evaluation of wraps - if not already.

## 2024-04-08 ENCOUNTER — Inpatient Hospital Stay

## 2024-04-09 MED ORDER — BUSPIRONE HCL 5 MG PO TABS: 5.0000 mg | ORAL_TABLET | Freq: Every morning | ORAL | 1 refills | Status: AC

## 2024-04-09 NOTE — Addendum Note (Signed)
 Addended by: SEBASTIAN KNEE on: 04/09/2024 04:48 PM   Modules accepted: Orders

## 2024-04-09 NOTE — Addendum Note (Signed)
 Addended by: GLENDIA ALLENA RAMAN on: 04/09/2024 08:09 PM   Modules accepted: Orders

## 2024-04-09 NOTE — Telephone Encounter (Signed)
 Called and spoke with Anita Walker, daughter on [place].  She reports that pt is weighing daily.  She has a follow up at [facility] 04/17/24.  Caregiver is wrapping and elevating legs at home.  While on the phone daughter mentioned that pt needed a refill of buspirone  5mg  updated prescription because now patient is taking daily.  Prescription pended for MD approval.   When daughter was talking about leg swelling and follow up with [facility], she mentioned that the doctor there told her that she might lose her legs because of the poor circulation because pt isn't getting up and ambulating enough.  Anita Walker said I think my mom is just done.

## 2024-04-09 NOTE — Telephone Encounter (Signed)
 Rx ok'd for buspar . See note. Need to confirm if she is receiving home health. If not, see if agreeable for PT through home health - to help get her up and moving.

## 2024-04-10 ENCOUNTER — Telehealth: Payer: Self-pay | Admitting: *Deleted

## 2024-04-10 NOTE — Transitions of Care (Post Inpatient/ED Visit) (Signed)
 " Transition of Care week 2  Visit Note  04/10/2024  Name: Anita Walker MRN: [id number]          DOB: [birth year]  Situation: Patient enrolled in [facility] 30-day program. Visit completed with daughter/dpr Lundy) by telephone.   Background:   Initial Transition Care Management Follow-up Telephone Call Discharge Date and Diagnosis: 03/22/24, COPD Exacerbation   Past Medical History:  Diagnosis Date   Anemia    Anxiety    Arthritis    Atrial fibrillation (HCC)    Breast cancer (HCC)    s/p lumpectomy 1992.  s/p chemo and xrt left breast   CHF (congestive heart failure) (HCC) 1/25   Clotting disorder    Depression 06/22   Diabetes mellitus (HCC)    Edema    feet/legs   Emphysema of lung (HCC) 2025   Gastric ulcer    GERD (gastroesophageal reflux disease)    HOH (hard of hearing)    aides   Hypercholesterolemia    Hypertension    Myocardial infarction [facility]) April 2025   Personal history of chemotherapy    Pulmonary emboli (HCC)    Sleep apnea    Thyroid  disease 2025    Assessment: Patient Reported Symptoms: Cognitive Cognitive Status: Poor judgment in daily scenarios, Requires Assistance Decision Making, Difficulties with attention and concentration, Confused or disoriented, Normal speech and language skills, Struggling with memory recall Cognitive/Intellectual Conditions Management [RPT]: Other Other: Alzheimers/Dementia   Health Maintenance Behaviors: Annual physical exam  Neurological Neurological Review of Symptoms: Hearing changes Neurological Management Strategies: Routine screening Neurological Comment: States patient wears hearing aids  HEENT HEENT Symptoms Reported:  (Daughter reports a dry cough) HEENT Management Strategies: Coping strategies, Routine screening HEENT Comment: Takes cough gtts and educated on humifider to assist with moisture in the air.    Cardiovascular Cardiovascular Symptoms Reported: Swelling in legs or feet (Daughter reports [facility]  agency wrapping LE daily. Daughter reports warm toes with no discoloration.) Does patient have uncontrolled Hypertension?: No Cardiovascular Management Strategies: Coping strategies, Routine screening, Medication therapy  Respiratory Respiratory Symptoms Reported: Dry cough (Daughter is aware to continues to take nebs and inhalers. Verified patient remains in the GREEN zone) Other Respiratory Symptoms: daughter reports pt continue to be adherent with using her nebulizer/inhalers as prescribed with good relief. Verified pt remains in the GREEN zone with exacerbated symptoms reported Additional Respiratory Details: Daughter states pt is decreased in her mobility and activities due to the added weight of fuild retention to her LE. Daughter states she continue to breath good with no noted distress. States patient refuses to elevate her legs even in the bed. Respiratory Management Strategies: Oxygen  therapy, Medication therapy, Routine screening, CPAP (Patient is not adherent with wearing her CPAP at night. RNCM strongly encouraged daily use as prescribed at night.)  Endocrine Endocrine Symptoms Reported: No symptoms reported Is patient diabetic?: Yes Is patient checking blood sugars at home?: Yes List most recent blood sugar readings, include date and time of day: Daughter reports weekly CBG at 116 fasting blood glucose level    Gastrointestinal Gastrointestinal Symptoms Reported: Constipation Additional Gastrointestinal Details: Utilize stool softners when needed. Verified patient is hydrating however limited due to ongoing fuild retention to LE Gastrointestinal Management Strategies: Medication therapy    Genitourinary Genitourinary Symptoms Reported: Incontinence Genitourinary Management Strategies: Incontinence garment/pad  Integumentary Integumentary Symptoms Reported: Skin changes Additional Integumentary Details: Bilateral swelling due to changes in recent medication post discharge. Patient has  started back on her diuretic along with [facility]  involvement completing weekly wraps to LE-reported by daughter Skin Management Strategies: Routine screening, Medication therapy, Coping strategies  Musculoskeletal Musculoskelatal Symptoms Reviewed: Difficulty walking, Unsteady gait, Weakness Additional Musculoskeletal Details: Daughter reports history of falls with several fractures. Daughter states recent ramp completed as a prevention measure Musculoskeletal Management Strategies: Routine screening, Medication therapy      Psychosocial Psychosocial Symptoms Reported: No symptoms reported         There were no vitals filed for this visit. Pain Scale: 0-10 Pain Score: 6  Pain Type: Chronic pain Pain Location: Foot Pain Orientation: Lower Pain Descriptors / Indicators: Pressure, Crushing (food in shoes to tight) Pain Onset: Gradual Patients Stated Pain Goal: 0 Pain Intervention(s):  ([facility] completing weekly visit to wrap LE with lubrication added) Multiple Pain Sites: No  Medications Reviewed Today     Reviewed by Alvia Olam BIRCH, RN (Registered Nurse) on 04/10/24 at 1315  Med List Status: <None>   Medication Order Taking? Sig Documenting Provider Last Dose Status Informant  acetaminophen  (TYLENOL ) 325 MG tablet [id number] Yes Take by mouth.  Patient taking differently: Take 500 mg by mouth every 6 (six) hours as needed.   [provider]  Active   albuterol  (PROVENTIL  HFA) 108 (90 Base) MCG/ACT inhaler [id number]  Inhale 2 puffs into the lungs every 4 (four) hours as needed for wheezing or shortness of breath. [provider]  Active   alum & mag hydroxide-simeth (MAALOX/MYLANTA) 200-200-20 MG/5ML suspension [id number] Yes Take 15 mLs by mouth every 6 (six) hours as needed for indigestion or heartburn. Darci Pore, MD  Active   apixaban (ELIQUIS) 5 MG TABS tablet [id number] Yes Take 5 mg by mouth 2 (two) times daily. [provider]  Active    arformoterol (BROVANA) 15 MCG/2ML NEBU [id number] Yes Take 15 mcg by nebulization 2 (two) times daily. [provider]  Active   azithromycin  (ZITHROMAX ) 250 MG tablet [id number] Yes Take 250 mg by mouth daily. Start on 03/25/2024 [provider]  Active   budesonide  (PULMICORT ) 0.5 MG/2ML nebulizer solution [id number] Yes Take 2 mLs (0.5 mg total) by nebulization 2 (two) times daily. Glendia Shad, MD  Active   busPIRone  (BUSPAR ) 5 MG tablet [id number] Yes Take 1 tablet (5 mg total) by mouth in the morning. Glendia Shad, MD  Active   diphenhydrAMINE  (BENADRYL ) 25 mg capsule [id number] Yes Take 25 mg at bedtime as needed by mouth for sleep.  [provider]  Active Child  Docusate Calcium (STOOL SOFTENER PO) [id number] Yes Take by mouth. [provider]  Active   doxycycline  (ADOXA) 100 MG tablet [id number]  Take 100 mg by mouth 2 (two) times daily. Take for 5 days with end date 03/24/2024  Patient not taking: Reported on 04/02/2024   [provider]  Active   escitalopram  (LEXAPRO ) 10 MG tablet [id number] Yes Take 1 tablet (10 mg total) by mouth daily. Glendia Shad, MD  Active   feeding supplement (ENSURE ENLIVE / ENSURE PLUS) LIQD [id number] Yes Take 237 mLs by mouth 2 (two) times daily between meals. Tobie Calix, MD  Active Child  fluticasone  (FLONASE ) 50 MCG/ACT nasal spray [id number] Yes Place 2 sprays into both nostrils daily. [provider]  Active   glucose blood (CONTOUR TEST) test strip [id number] Yes USE TO TEST BLOOD SUGAR ONCE DAILY Glendia Shad, MD  Active Child  ipratropium-albuterol  (DUONEB) 0.5-2.5 (3) MG/3ML SOLN [id number] Yes Take 3 mLs by nebulization every 4 (four) hours as needed. Inhale 3 mL by nebulization four (  4) times a day for 5 days, THEN 3 mL every six (6) hours as needed (shortness of breath/wheezing). [provider]  Active   losartan  (COZAAR ) 25 MG tablet [id number]  Take 1 tablet (25 mg total) by mouth  daily.  Patient not taking: Reported on 03/24/2024   Glendia Shad, MD  Active   Multiple Vitamin (MULTIVITAMIN WITH MINERALS) TABS tablet [id number] Yes Take 1 tablet daily by mouth. [provider]  Active Child  nystatin powder [id number] Yes Apply 1 Application topically 2 (two) times daily. Apply topically two (2) times a day as needed. Apply to affected areas (skin folds). [provider]  Active   ondansetron  (ZOFRAN -ODT) 4 MG disintegrating tablet [id number] Yes Take 1 tablet (4 mg total) by mouth every 8 (eight) hours as needed for nausea or vomiting. Narendra, Nischal, MD  Active   pantoprazole  (PROTONIX ) 40 MG tablet [id number] Yes TAKE 1 TABLET(40 MG) BY MOUTH DAILY Glendia Shad, MD  Active   polyethylene glycol (MIRALAX  / GLYCOLAX ) 17 g packet [id number] Yes Take 17 g by mouth 2 (two) times daily. [provider]  Active   predniSONE  (DELTASONE ) 20 MG tablet [id number]  Take 20 mg by mouth daily with breakfast. Take 2 tablets (40 mg total) by mouth in the morning for 1 day. Take on 03/23/24-Daughter reports patient completed dose  Patient not taking: Reported on 04/02/2024   [provider]  Active   QUEtiapine (SEROQUEL) 25 MG tablet [id number] Yes Take 25 mg by mouth at bedtime. [provider]  Active   simvastatin  (ZOCOR ) 10 MG tablet [id number] Yes Take 1 tablet (10 mg total) by mouth daily. Glendia Shad, MD  Active   torsemide (DEMADEX) 20 MG tablet [id number] Yes Take 20 mg by mouth daily. [provider]  Active            Med Note DORI, DAVINA E   Fri Jun 22, 2023  9:30 AM) Daughter states patient takes 1 tablet 2 x per day.             Recommendation:   PCP Follow-up Continue Current Plan of Care  Follow Up Plan:   Telephone follow-up in 1 week   Olam Ku, RN, BSN, MSN [facility], [facility] RN Care Manager Direct Dial: [phone]  Fax: [phone]      "

## 2024-04-10 NOTE — Patient Instructions (Signed)
 Visit Information  Thank you for taking time to visit with me today. Please don't hesitate to contact me if I can be of assistance to you before our next scheduled telephone appointment.  Our next appointment is by telephone on 04/17/2024 at 1:00 PM  Following is a copy of your care plan:   Goals Addressed             This Visit's Progress    [facility] Transitions of Care (TOC) Care Plan       Problems:  Recent Hospitalization for treatment of COPD Follow up appointment scheduled 2/9 and cardiologist next follow up on 04/30/2024   Goal:  Over the next 30 days, the patient will not experience hospital readmission  Interventions:  CHF- Patient was restarted the Torsemide twice daily for three days. Currently back on her maintenance dose 20 mg daily in AM. COPD Interventions: Advised patient to track and manage COPD triggers Advised patient to self assesses COPD action plan zone and make appointment with provider if in the yellow zone for 48 hours without improvement Discussed the importance of adequate rest and management of fatigue with COPD Use of home oxygen  Advised daughter/patient to attend all schedule hospital follow-up appointments Advise daughter to contact her provider with any acute needs  Advise daughter work with pt to use her CPAP at night and to elevate her LE to reduce ongoing swelling. Advise to increase her activities as tolerated to assist with reducing her ongoing swelling. Advise daughter to report any signs or symptoms of infection related to LE edema.   Patient Self Care Activities:  Attend all scheduled provider appointments Call pharmacy for medication refills 3-7 days in advance of running out of medications Call provider office for new concerns or questions  Notify RN Care Manager of [facility] call rescheduling needs Participate in Transition of Care Program/Attend TOC scheduled calls Take medications as prescribed   follow rescue plan if symptoms flare-up keep  follow-up appointments: Primary provided appointment on 04/14/2024 and Cardiologist next appointment on 04/30/2024 Daughter will contact the cardiologist and inquire on the post-op medications for CHF (torsemide)  Plan:  Telephone follow up appointment with care management team member scheduled for:  04/17/24 @ 1:00 pm with Olam Ku RN case manager The patient has been provided with contact information for the care management team and has been advised to call with any health related questions or concerns.         Care plan and visit instructions communicated with the patient verbally today. Patient agrees to receive a copy in MyChart. Active MyChart status and patient understanding of how to access instructions and care plan via MyChart confirmed with patient.     Telephone follow up appointment with care management team member scheduled for: The patient has been provided with contact information for the care management team and has been advised to call with any health related questions or concerns.   Please call the care guide team at [phone] if you need to cancel or reschedule your appointment.   Please call the Suicide and Crisis Lifeline: 988 call the [place]  National Suicide Prevention Lifeline: (564)146-1042 or TTY: (613)276-9872 TTY (671)771-2729) to talk to a trained counselor call 1-800-273-TALK (toll free, 24 hour hotline) if you are experiencing a Mental Health or Behavioral Health Crisis or need someone to talk to.   Olam Ku, RN, BSN, MSN [facility], [facility] RN Care Manager Direct Dial: [phone]  Fax: [phone]

## 2024-04-14 ENCOUNTER — Inpatient Hospital Stay

## 2024-04-16 ENCOUNTER — Ambulatory Visit (INDEPENDENT_AMBULATORY_CARE_PROVIDER_SITE_OTHER): Admitting: Vascular Surgery

## 2024-04-17 ENCOUNTER — Ambulatory Visit (INDEPENDENT_AMBULATORY_CARE_PROVIDER_SITE_OTHER): Admitting: Nurse Practitioner

## 2024-05-16 ENCOUNTER — Ambulatory Visit: Admitting: Internal Medicine

## 2024-05-19 ENCOUNTER — Ambulatory Visit: Admitting: Podiatry
